# Patient Record
Sex: Female | Born: 1945
Health system: Southern US, Community
[De-identification: ages and names within clinical notes are randomized; demographics above are authoritative.]

## PROBLEM LIST (undated history)

## (undated) DIAGNOSIS — F32A Depression, unspecified: Secondary | ICD-10-CM

## (undated) DIAGNOSIS — E039 Hypothyroidism, unspecified: Secondary | ICD-10-CM

## (undated) DIAGNOSIS — E785 Hyperlipidemia, unspecified: Secondary | ICD-10-CM

## (undated) DIAGNOSIS — I4891 Unspecified atrial fibrillation: Secondary | ICD-10-CM

## (undated) DIAGNOSIS — F419 Anxiety disorder, unspecified: Secondary | ICD-10-CM

## (undated) DIAGNOSIS — I10 Essential (primary) hypertension: Secondary | ICD-10-CM

## (undated) DIAGNOSIS — J189 Pneumonia, unspecified organism: Secondary | ICD-10-CM

## (undated) DIAGNOSIS — F329 Major depressive disorder, single episode, unspecified: Secondary | ICD-10-CM

## (undated) DIAGNOSIS — G459 Transient cerebral ischemic attack, unspecified: Secondary | ICD-10-CM

## (undated) DIAGNOSIS — Z8673 Personal history of transient ischemic attack (TIA), and cerebral infarction without residual deficits: Secondary | ICD-10-CM

## (undated) DIAGNOSIS — I5032 Chronic diastolic (congestive) heart failure: Secondary | ICD-10-CM

## (undated) DIAGNOSIS — I499 Cardiac arrhythmia, unspecified: Secondary | ICD-10-CM

## (undated) HISTORY — PX: OTHER SURGICAL HISTORY: SHX169

## (undated) HISTORY — PX: BREAST SURGERY: SHX581

## (undated) HISTORY — DX: Chronic diastolic (congestive) heart failure: I50.32

## (undated) HISTORY — DX: Essential (primary) hypertension: I10

## (undated) HISTORY — PX: CHOLECYSTECTOMY: SHX55

## (undated) HISTORY — DX: Personal history of transient ischemic attack (TIA), and cerebral infarction without residual deficits: Z86.73

## (undated) HISTORY — PX: ABDOMINAL HYSTERECTOMY: SHX81

## (undated) HISTORY — DX: Hyperlipidemia, unspecified: E78.5

## (undated) HISTORY — DX: Hypothyroidism, unspecified: E03.9

---
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
## 2013-03-31 ENCOUNTER — Emergency Department (HOSPITAL_COMMUNITY): Payer: Medicare PPO

## 2013-03-31 ENCOUNTER — Inpatient Hospital Stay (HOSPITAL_COMMUNITY)
Admission: EM | Admit: 2013-03-31 | Discharge: 2013-04-05 | DRG: 189 | Disposition: A | Discharge status: Home / Self Care | Payer: Medicare PPO | Attending: Internal Medicine | Admitting: Internal Medicine

## 2013-03-31 ENCOUNTER — Encounter (HOSPITAL_COMMUNITY): Payer: Self-pay | Admitting: Emergency Medicine

## 2013-03-31 DIAGNOSIS — J9600 Acute respiratory failure, unspecified whether with hypoxia or hypercapnia: Principal | ICD-10-CM

## 2013-03-31 DIAGNOSIS — E039 Hypothyroidism, unspecified: Secondary | ICD-10-CM | POA: Diagnosis present

## 2013-03-31 DIAGNOSIS — I480 Paroxysmal atrial fibrillation: Secondary | ICD-10-CM | POA: Diagnosis present

## 2013-03-31 DIAGNOSIS — R590 Localized enlarged lymph nodes: Secondary | ICD-10-CM | POA: Diagnosis present

## 2013-03-31 DIAGNOSIS — M40209 Unspecified kyphosis, site unspecified: Secondary | ICD-10-CM | POA: Diagnosis present

## 2013-03-31 DIAGNOSIS — Z7982 Long term (current) use of aspirin: Secondary | ICD-10-CM

## 2013-03-31 DIAGNOSIS — I4891 Unspecified atrial fibrillation: Secondary | ICD-10-CM

## 2013-03-31 DIAGNOSIS — R0602 Shortness of breath: Secondary | ICD-10-CM

## 2013-03-31 DIAGNOSIS — I509 Heart failure, unspecified: Secondary | ICD-10-CM | POA: Diagnosis present

## 2013-03-31 DIAGNOSIS — J9621 Acute and chronic respiratory failure with hypoxia: Secondary | ICD-10-CM | POA: Diagnosis present

## 2013-03-31 DIAGNOSIS — I5030 Unspecified diastolic (congestive) heart failure: Secondary | ICD-10-CM | POA: Diagnosis present

## 2013-03-31 DIAGNOSIS — E0590 Thyrotoxicosis, unspecified without thyrotoxic crisis or storm: Secondary | ICD-10-CM | POA: Diagnosis present

## 2013-03-31 DIAGNOSIS — I2789 Other specified pulmonary heart diseases: Secondary | ICD-10-CM | POA: Diagnosis present

## 2013-03-31 DIAGNOSIS — I4819 Other persistent atrial fibrillation: Secondary | ICD-10-CM | POA: Diagnosis present

## 2013-03-31 DIAGNOSIS — J8410 Pulmonary fibrosis, unspecified: Secondary | ICD-10-CM | POA: Diagnosis present

## 2013-03-31 DIAGNOSIS — Z8701 Personal history of pneumonia (recurrent): Secondary | ICD-10-CM

## 2013-03-31 DIAGNOSIS — J90 Pleural effusion, not elsewhere classified: Secondary | ICD-10-CM

## 2013-03-31 DIAGNOSIS — R599 Enlarged lymph nodes, unspecified: Secondary | ICD-10-CM

## 2013-03-31 DIAGNOSIS — J189 Pneumonia, unspecified organism: Secondary | ICD-10-CM

## 2013-03-31 DIAGNOSIS — I10 Essential (primary) hypertension: Secondary | ICD-10-CM | POA: Diagnosis present

## 2013-03-31 DIAGNOSIS — J9601 Acute respiratory failure with hypoxia: Secondary | ICD-10-CM | POA: Diagnosis present

## 2013-03-31 DIAGNOSIS — E079 Disorder of thyroid, unspecified: Secondary | ICD-10-CM

## 2013-03-31 DIAGNOSIS — Z8673 Personal history of transient ischemic attack (TIA), and cerebral infarction without residual deficits: Secondary | ICD-10-CM

## 2013-03-31 DIAGNOSIS — M4000 Postural kyphosis, site unspecified: Secondary | ICD-10-CM | POA: Diagnosis present

## 2013-03-31 DIAGNOSIS — R0902 Hypoxemia: Secondary | ICD-10-CM | POA: Diagnosis present

## 2013-03-31 HISTORY — DX: Unspecified atrial fibrillation: I48.91

## 2013-03-31 HISTORY — DX: Pneumonia, unspecified organism: J18.9

## 2013-03-31 HISTORY — DX: Transient cerebral ischemic attack, unspecified: G45.9

## 2013-03-31 LAB — CBC WITH DIFFERENTIAL/PLATELET
Basophils Absolute: 0 10*3/uL (ref 0.0–0.1)
Basophils Relative: 1 % (ref 0–1)
EOS ABS: 0.1 10*3/uL (ref 0.0–0.7)
Eosinophils Relative: 3 % (ref 0–5)
HCT: 36.3 % (ref 36.0–46.0)
HEMOGLOBIN: 11.5 g/dL — AB (ref 12.0–15.0)
LYMPHS ABS: 1.1 10*3/uL (ref 0.7–4.0)
Lymphocytes Relative: 23 % (ref 12–46)
MCH: 28.9 pg (ref 26.0–34.0)
MCHC: 31.7 g/dL (ref 30.0–36.0)
MCV: 91.2 fL (ref 78.0–100.0)
MONOS PCT: 6 % (ref 3–12)
Monocytes Absolute: 0.3 10*3/uL (ref 0.1–1.0)
Neutro Abs: 3.3 10*3/uL (ref 1.7–7.7)
Neutrophils Relative %: 68 % (ref 43–77)
Platelets: 229 10*3/uL (ref 150–400)
RBC: 3.98 MIL/uL (ref 3.87–5.11)
RDW: 17.8 % — ABNORMAL HIGH (ref 11.5–15.5)
WBC: 4.8 10*3/uL (ref 4.0–10.5)

## 2013-03-31 LAB — COMPREHENSIVE METABOLIC PANEL
ALK PHOS: 111 U/L (ref 39–117)
ALT: 22 U/L (ref 0–35)
AST: 29 U/L (ref 0–37)
Albumin: 3.7 g/dL (ref 3.5–5.2)
BUN: 15 mg/dL (ref 6–23)
CALCIUM: 9.9 mg/dL (ref 8.4–10.5)
CO2: 27 mEq/L (ref 19–32)
Chloride: 105 mEq/L (ref 96–112)
Creatinine, Ser: 0.94 mg/dL (ref 0.50–1.10)
GFR calc non Af Amer: 61 mL/min — ABNORMAL LOW (ref >90)
GFR, EST AFRICAN AMERICAN: 71 mL/min — AB (ref >90)
GLUCOSE: 89 mg/dL (ref 70–99)
Potassium: 4.3 mEq/L (ref 3.7–5.3)
Sodium: 141 mEq/L (ref 137–147)
Total Bilirubin: 0.7 mg/dL (ref 0.3–1.2)
Total Protein: 8.1 g/dL (ref 6.0–8.3)

## 2013-03-31 LAB — TROPONIN I: Troponin I: <0.3 ng/mL (ref <0.30)

## 2013-03-31 LAB — PRO B NATRIURETIC PEPTIDE: PRO B NATRI PEPTIDE: 1241 pg/mL — AB (ref 0–125)

## 2013-03-31 MED ORDER — DEXTROSE 5 % IV SOLN
1.0000 g | Freq: Three times a day (TID) | INTRAVENOUS | Status: DC
  Administered 2013-04-01 – 2013-04-05 (×13): 1 g via INTRAVENOUS
  Filled 2013-03-31 (×17): qty 1

## 2013-03-31 MED ORDER — LEVOTHYROXINE SODIUM 100 MCG PO TABS
100.0000 ug | ORAL_TABLET | Freq: Every day | ORAL | Status: DC
  Administered 2013-04-01 – 2013-04-05 (×6): 100 ug via ORAL
  Filled 2013-03-31 (×5): qty 1

## 2013-03-31 MED ORDER — ASPIRIN EC 81 MG PO TBEC
81.0000 mg | DELAYED_RELEASE_TABLET | Freq: Every day | ORAL | Status: DC
  Administered 2013-04-01 – 2013-04-05 (×5): 81 mg via ORAL
  Filled 2013-03-31 (×5): qty 1

## 2013-03-31 MED ORDER — ESTRADIOL 1 MG PO TABS
ORAL_TABLET | ORAL | Status: AC
  Filled 2013-03-31: qty 2

## 2013-03-31 MED ORDER — ALBUTEROL SULFATE (2.5 MG/3ML) 0.083% IN NEBU: INHALATION_SOLUTION | Freq: Four times a day (QID) | RESPIRATORY_TRACT | Status: DC | PRN

## 2013-03-31 MED ORDER — DEXTROSE 5 % IV SOLN
INTRAVENOUS | Status: AC
  Filled 2013-03-31: qty 1

## 2013-03-31 MED ORDER — IOHEXOL 350 MG/ML SOLN
100.0000 mL | Freq: Once | INTRAVENOUS | Status: AC | PRN
  Administered 2013-03-31: 100 mL via INTRAVENOUS

## 2013-03-31 MED ORDER — SODIUM CHLORIDE 0.9 % IV SOLN: 250.0000 mL | INTRAVENOUS | Status: DC | PRN

## 2013-03-31 MED ORDER — ESCITALOPRAM OXALATE 10 MG PO TABS
20.0000 mg | ORAL_TABLET | Freq: Every day | ORAL | Status: DC
  Administered 2013-03-31 – 2013-04-04 (×5): 20 mg via ORAL
  Filled 2013-03-31 (×5): qty 2

## 2013-03-31 MED ORDER — METOPROLOL SUCCINATE ER 50 MG PO TB24
100.0000 mg | ORAL_TABLET | Freq: Every day | ORAL | Status: DC
  Administered 2013-04-01 – 2013-04-02 (×2): 100 mg via ORAL
  Filled 2013-03-31 (×3): qty 2

## 2013-03-31 MED ORDER — CEFEPIME HCL 1 G IJ SOLR
1.0000 g | Freq: Three times a day (TID) | INTRAMUSCULAR | Status: DC
  Filled 2013-03-31 (×9): qty 1

## 2013-03-31 MED ORDER — SODIUM CHLORIDE 0.9 % IV SOLN: INTRAVENOUS | Status: DC

## 2013-03-31 MED ORDER — MELOXICAM 7.5 MG PO TABS
ORAL_TABLET | ORAL | Status: AC
  Filled 2013-03-31: qty 2

## 2013-03-31 MED ORDER — LISINOPRIL 10 MG PO TABS
20.0000 mg | ORAL_TABLET | Freq: Two times a day (BID) | ORAL | Status: DC
  Administered 2013-03-31 – 2013-04-02 (×5): 20 mg via ORAL
  Filled 2013-03-31 (×6): qty 2

## 2013-03-31 MED ORDER — PANTOPRAZOLE SODIUM 40 MG PO TBEC
40.0000 mg | DELAYED_RELEASE_TABLET | Freq: Every day | ORAL | Status: DC
  Administered 2013-04-01 – 2013-04-05 (×5): 40 mg via ORAL
  Filled 2013-03-31 (×5): qty 1

## 2013-03-31 MED ORDER — SODIUM CHLORIDE 0.9 % IJ SOLN
3.0000 mL | Freq: Two times a day (BID) | INTRAMUSCULAR | Status: DC
  Administered 2013-03-31 – 2013-04-05 (×9): 3 mL via INTRAVENOUS

## 2013-03-31 MED ORDER — VANCOMYCIN HCL IN DEXTROSE 1-5 GM/200ML-% IV SOLN
1000.0000 mg | Freq: Once | INTRAVENOUS | Status: AC
  Administered 2013-03-31: 1000 mg via INTRAVENOUS
  Filled 2013-03-31: qty 200

## 2013-03-31 MED ORDER — AMLODIPINE BESYLATE 5 MG PO TABS
5.0000 mg | ORAL_TABLET | Freq: Every day | ORAL | Status: DC
  Administered 2013-04-01 – 2013-04-05 (×5): 5 mg via ORAL
  Filled 2013-03-31 (×5): qty 1

## 2013-03-31 MED ORDER — SODIUM CHLORIDE 0.9 % IV BOLUS (SEPSIS)
250.0000 mL | Freq: Once | INTRAVENOUS | Status: AC
  Administered 2013-03-31: 250 mL via INTRAVENOUS

## 2013-03-31 MED ORDER — ONDANSETRON HCL 4 MG/2ML IJ SOLN
4.0000 mg | Freq: Once | INTRAMUSCULAR | Status: AC
  Administered 2013-03-31: 4 mg via INTRAVENOUS
  Filled 2013-03-31: qty 2

## 2013-03-31 MED ORDER — ESTRADIOL 1 MG PO TABS
2.0000 mg | ORAL_TABLET | Freq: Every day | ORAL | Status: DC
  Administered 2013-03-31 – 2013-04-04 (×5): 2 mg via ORAL
  Filled 2013-03-31: qty 1
  Filled 2013-03-31 (×3): qty 2
  Filled 2013-03-31 (×2): qty 1
  Filled 2013-03-31: qty 2

## 2013-03-31 MED ORDER — FUROSEMIDE 40 MG PO TABS
40.0000 mg | ORAL_TABLET | Freq: Every day | ORAL | Status: DC
  Administered 2013-04-01 – 2013-04-02 (×2): 40 mg via ORAL
  Filled 2013-03-31 (×2): qty 1

## 2013-03-31 MED ORDER — ENOXAPARIN SODIUM 40 MG/0.4ML ~~LOC~~ SOLN
40.0000 mg | SUBCUTANEOUS | Status: DC
  Administered 2013-04-01 – 2013-04-05 (×5): 40 mg via SUBCUTANEOUS
  Filled 2013-03-31 (×5): qty 0.4

## 2013-03-31 MED ORDER — SODIUM CHLORIDE 0.9 % IJ SOLN: 3.0000 mL | INTRAMUSCULAR | Status: DC | PRN

## 2013-03-31 MED ORDER — AMIODARONE HCL 200 MG PO TABS
200.0000 mg | ORAL_TABLET | Freq: Every day | ORAL | Status: DC
  Administered 2013-04-01 – 2013-04-05 (×5): 200 mg via ORAL
  Filled 2013-03-31 (×5): qty 1

## 2013-03-31 MED ORDER — VANCOMYCIN HCL IN DEXTROSE 750-5 MG/150ML-% IV SOLN
750.0000 mg | Freq: Two times a day (BID) | INTRAVENOUS | Status: DC
  Administered 2013-04-01 – 2013-04-05 (×9): 750 mg via INTRAVENOUS
  Filled 2013-03-31 (×11): qty 150

## 2013-03-31 MED ORDER — MELOXICAM 7.5 MG PO TABS
15.0000 mg | ORAL_TABLET | Freq: Every day | ORAL | Status: DC
Start: 2013-03-31 — End: 2013-04-05
  Administered 2013-03-31 – 2013-04-04 (×5): 15 mg via ORAL
  Filled 2013-03-31 (×2): qty 2
  Filled 2013-03-31: qty 1
  Filled 2013-03-31 (×2): qty 2
  Filled 2013-03-31 (×2): qty 1

## 2013-03-31 MED ORDER — DEXTROSE 5 % IV SOLN
1.0000 g | Freq: Once | INTRAVENOUS | Status: AC
  Administered 2013-03-31: 1 g via INTRAVENOUS

## 2013-03-31 NOTE — Progress Notes (Signed)
ANTIBIOTIC CONSULT NOTE - INITIAL  Pharmacy Consult for Vancomycin Indication: pneumonia  Allergies  Allergen Reactions  . Levaquin [Levofloxacin] Other (See Comments)    Headache   . Sulfa Antibiotics Hives    Patient Measurements: Height: 5\' 2"  (157.5 cm) Weight: 181 lb (82.101 kg) IBW/kg (Calculated) : 50.1 Adjusted Body Weight:   Vital Signs: Temp: 97.8 F (36.6 C) (01/23 1852) Temp src: Oral (01/23 1852) BP: 167/79 mmHg (01/23 1852) Pulse Rate: 67 (01/23 1852) Intake/Output from previous day:   Intake/Output from this shift:    Labs:  Recent Labs  03/31/13 1550  WBC 4.8  HGB 11.5*  PLT 229  CREATININE 0.94   Estimated Creatinine Clearance: 57.7 ml/min (by C-G formula based on Cr of 0.94). No results found for this basename: VANCOTROUGH, VANCOPEAK, VANCORANDOM, GENTTROUGH, GENTPEAK, GENTRANDOM, TOBRATROUGH, TOBRAPEAK, TOBRARND, AMIKACINPEAK, AMIKACINTROU, AMIKACIN,  in the last 72 hours   Microbiology: No results found for this or any previous visit (from the past 720 hour(s)).  Medical History: Past Medical History  Diagnosis Date  . Atrial fibrillation   . Hypertension   . Thyroid disease   . Pneumonia     Medications:  Scheduled:  . [START ON 04/01/2013] vancomycin  750 mg Intravenous Q12H   Assessment: Healthcare acquired pneumonia Patient previously hospitalized over Thanksgiving for pneumonia in [place] Cefepime 1 GM IV x 1 dose in ED   Goal of Therapy:  Vancomycin trough level 15-20  Plan:  Vancomycin 1 GM IV loading dose, then 750 mg IV every 12 hours Vancomycin trough at steady state Monitor renal function Labs per protocol  Abner Greenspan, Emmory Solivan Bennett 03/31/2013,7:10 PM

## 2013-03-31 NOTE — H&P (Signed)
Chief Complaint:  sob  HPI: 68 yo female h/o afib, htn traveled here from [place] to visit her dtr 3 days ago.  Has been having worsening sob esp with exertion since.  No significant leg swelling or edema.  No fevers.  Having a dry nonproductive cough, no hemoptysis.  No cp.  Was tx for pna with doxycyline? She thinks in November for about a week then ended up getting hospitalized in [place] the week before thanksgiving for 6 days with pna.  When she was dc from hosp she was feeling better, and was not d/c on any more abx.  Since that time she has been recovering well, no sob.  No fevers since.  She has seen a pulmonologist several times since that hospitalization who has been doing serial chest xrays, she says that she was told by her pulmonologist that "there was still something there, but it was looking better.".  She does not recall having any formal PFT done.    Review of Systems:  Positive and negative as per HPI otherwise all other systems are negative  Past Medical History: Past Medical History  Diagnosis Date  . Atrial fibrillation   . Hypertension   . Thyroid disease   . Pneumonia   . TIA (transient ischemic attack)    Past Surgical History  Procedure Laterality Date  . Abdominal hysterectomy    . Breast surgery    . Cholecystectomy      Medications: Prior to Admission medications   Medication Sig Start Date End Date Taking? Authorizing Provider  acetaminophen (TYLENOL) 500 MG tablet Take 1,000 mg by mouth every 6 (six) hours as needed.   Yes Historical Provider, MD  albuterol (PROVENTIL HFA;VENTOLIN HFA) 108 (90 BASE) MCG/ACT inhaler Inhale 1-2 puffs into the lungs every 6 (six) hours as needed for wheezing or shortness of breath.   Yes Historical Provider, MD  amiodarone (PACERONE) 200 MG tablet Take 200 mg by mouth daily.   Yes Historical Provider, MD  amLODipine (NORVASC) 5 MG tablet Take 5 mg by mouth daily.   Yes Historical Provider, MD  aspirin EC 81 MG tablet Take  81 mg by mouth daily.   Yes Historical Provider, MD  escitalopram (LEXAPRO) 20 MG tablet Take 20 mg by mouth at bedtime.   Yes Historical Provider, MD  estradiol (ESTRACE) 2 MG tablet Take 2 mg by mouth at bedtime.   Yes Historical Provider, MD  furosemide (LASIX) 40 MG tablet Take 40 mg by mouth daily.   Yes Historical Provider, MD  levothyroxine (SYNTHROID, LEVOTHROID) 100 MCG tablet Take 100 mcg by mouth daily before breakfast.   Yes Historical Provider, MD  lisinopril (PRINIVIL,ZESTRIL) 20 MG tablet Take 20 mg by mouth 2 (two) times daily.   Yes Historical Provider, MD  magnesium oxide (MAG-OX) 400 MG tablet Take 400 mg by mouth daily.   Yes Historical Provider, MD  meloxicam (MOBIC) 15 MG tablet Take 15 mg by mouth at bedtime.   Yes Historical Provider, MD  metoprolol succinate (TOPROL-XL) 100 MG 24 hr tablet Take 100 mg by mouth daily. Take with or immediately following a meal.   Yes Historical Provider, MD  omeprazole (PRILOSEC) 40 MG capsule Take 40 mg by mouth daily.   Yes Historical Provider, MD  rizatriptan (MAXALT-MLT) 10 MG disintegrating tablet Take 10 mg by mouth as needed for migraine. May repeat in 2 hours if needed   Yes Historical Provider, MD    Allergies:   Allergies  Allergen Reactions  .  Levaquin [Levofloxacin] Other (See Comments)    Headache   . Sulfa Antibiotics Hives    Social History:  reports that she has never smoked. She does not have any smokeless tobacco history on file. She reports that she does not drink alcohol or use illicit drugs.  Family History: History reviewed. No pertinent family history.  Physical Exam: Filed Vitals:   03/31/13 1900 03/31/13 1914 03/31/13 2000 03/31/13 2105  BP: 160/67  159/68 154/69  Pulse:  68 65 62  Temp:  97.7 F (36.5 C)  97.6 F (36.4 C)  TempSrc:  Oral  Oral  Resp: 20  16 22   Height:    5\' 2"  (1.575 m)  Weight:      SpO2:   97% 96%   General appearance: alert, cooperative and no distress Head:  Normocephalic, without obvious abnormality, atraumatic Eyes: negative Nose: Nares normal. Septum midline. Mucosa normal. No drainage or sinus tenderness. Neck: no JVD and supple, symmetrical, trachea midline Lungs: clear to auscultation bilaterally significant kyphosis noted Heart: regular rate and rhythm, S1, S2 normal, no murmur, click, rub or gallop Abdomen: soft, non-tender; bowel sounds normal; no masses,  no organomegaly Extremities: extremities normal, atraumatic, no cyanosis or edema Pulses: 2+ and symmetric Skin: Skin color, texture, turgor normal. No rashes or lesions Neurologic: Grossly normal   Labs on Admission:   Recent Labs  03/31/13 1550  NA 141  K 4.3  CL 105  CO2 27  GLUCOSE 89  BUN 15  CREATININE 0.94  CALCIUM 9.9    Recent Labs  03/31/13 1550  AST 29  ALT 22  ALKPHOS 111  BILITOT 0.7  PROT 8.1  ALBUMIN 3.7    Recent Labs  03/31/13 1550  WBC 4.8  NEUTROABS 3.3  HGB 11.5*  HCT 36.3  MCV 91.2  PLT 229    Recent Labs  03/31/13 1550  TROPONINI <0.30   Radiological Exams on Admission: Dg Chest 2 View  03/31/2013   CLINICAL DATA:  Increased shortness of breath.  Recent pneumonia.  EXAM: CHEST  2 VIEW  COMPARISON:  None.  FINDINGS: The heart is mildly enlarged. Bilateral lower lobe interstitial and airspace disease is present, left greater than right. Effusions are evident. Atherosclerotic calcifications are present at the aortic arch. Thoracic kyphosis is exaggerated.  IMPRESSION: 1. Bilateral interstitial and airspace disease, left greater than right. This is highly concerning for pneumonia. 2. Bilateral pleural effusions are also worse on the left. 3. Exaggerated thoracic kyphosis. 4. Mild cardiac enlargement.   Electronically Signed   By: Lawrence Santiago M.D.   On: 03/31/2013 15:25   Ct Angio Chest Pe W/cm &/or Wo Cm  03/31/2013   CLINICAL DATA:  Short of breath  EXAM: CT ANGIOGRAPHY CHEST WITH CONTRAST  TECHNIQUE: Multidetector CT imaging of  the chest was performed using the standard protocol during bolus administration of intravenous contrast. Multiplanar CT image reconstructions including MIPs were obtained to evaluate the vascular anatomy.  CONTRAST:  14mL OMNIPAQUE IOHEXOL 350 MG/ML SOLN  COMPARISON:  None.  FINDINGS: No filling defects in the pulmonary arterial tree to suggest acute pulmonary thromboembolism.  Mild atherosclerotic changes of the aortic arch. No evidence of dissection or aneurysm.  Abnormal mediastinal adenopathy is present. Precarinal nodal mass measures 2.0 x 3.3 cm. 11 mm AP window node. 11 mm right hilar node. 10 mm left hilar node. Several small nodes between the innominate artery and right innominate vein are noted.  Bilateral small pleural effusions are present. There is  anterior extension suggesting an element of loculation.  There is wall thickening and/or edema of the entire length of the esophagus.  No pneumothorax.  There are diffuse pulmonary parenchymal changes with a predilection for the lung bases characterize by interlobular septal thickening and heterogeneous opacities. No nodular lung disease. No cystic changes. Some areas of ground-glass are visualized.  Benign appearing liver cysts.  Kyphosis of the thoracic spine.  Review of the MIP images confirms the above findings.  IMPRESSION: No evidence of acute pulmonary thromboembolism.  Bilateral pleural effusions with evidence of loculation.  Bilateral interstitial lung disease as described. This may represent chronic disease or acute inflammatory process.  Abnormal mediastinal adenopathy. Differential diagnosis includes volume overload, inflammatory disease, or malignancy.   Electronically Signed   By: Maryclare Bean M.D.   On: 03/31/2013 18:27    Assessment/Plan  68 yo female with persistent/recurrent bilateral pleural effusions, mediastinal adenopathy, sob for 3 days with new hypoxemia from possible recurrent pna  Principal Problem:   Acute respiratory failure  with hypoxia--  Unclear etiology.  Will obtain old records from [place].  Will cover empirically for recurrent pna with iv vanc and cefepime.  ???restrictive lung disease.  Also on amiodorone???  pulm toxicity form amiodorone.  Will ck tsh studies and also 2 D echo of heart.  Have also obtained pulmonary consultation.  Oxygen supplementation as needed.  On RA 84%.    Active Problems:   Hypertension   Thyroid disease   Atrial fibrillation   SOB (shortness of breath)   Mediastinal adenopathy   Pleural effusion, bilateral   Hypoxia   Kyphosis deformity of spine    Brinlee Gambrell A 03/31/2013, 10:01 PM

## 2013-03-31 NOTE — ED Provider Notes (Signed)
CSN: [id number]     Arrival date & time 03/31/13  1420 History   First MD Initiated Contact with Patient 03/31/13 1624     Chief Complaint  Patient presents with  . Shortness of Breath   (Consider location/radiation/quality/duration/timing/severity/associated sxs/prior Treatment) Patient is a 68 y.o. female presenting with shortness of breath. The history is provided by the patient.  Shortness of Breath Associated symptoms: no abdominal pain, no chest pain, no fever, no headaches, no rash and no vomiting    patient with complaint of three-day history of exertional shortness of breath. Associated with chest pain substernal 15-30 minutes intermittently last episode was at 9 this morning none since. Patient has been struggling with pneumonia since admission November 21. Patient with close followup in [place] she is from out of town they have been showing slow resolution of the pneumonia. Patient was feeling better until 3 days ago. This does not feel like her pneumonia though. She had a fever with that. Patient recently made to drive from [place] to here. Patient has not been on any antibiotics since the end of November.  Past Medical History  Diagnosis Date  . Atrial fibrillation   . Hypertension   . Thyroid disease   . Pneumonia    Past Surgical History  Procedure Laterality Date  . Abdominal hysterectomy    . Breast surgery    . Cholecystectomy     History reviewed. No pertinent family history. History  Substance Use Topics  . Smoking status: Never Smoker   . Smokeless tobacco: Not on file  . Alcohol Use: No   OB History   Grav Para Term Preterm Abortions TAB SAB Ect Mult Living                 Review of Systems  Constitutional: Negative for fever.  HENT: Negative for congestion.   Eyes: Negative for redness.  Respiratory: Positive for shortness of breath.   Cardiovascular: Negative for chest pain.  Gastrointestinal: Negative for nausea, vomiting and abdominal pain.   Genitourinary: Negative for dysuria.  Musculoskeletal: Negative for back pain.  Skin: Negative for rash.  Neurological: Negative for headaches.  Hematological: Does not bruise/bleed easily.  Psychiatric/Behavioral: Negative for confusion.    Allergies  Levaquin and Sulfa antibiotics  Home Medications   Current Outpatient Rx  Name  Route  Sig  Dispense  Refill  . acetaminophen (TYLENOL) 500 MG tablet   Oral   Take 1,000 mg by mouth every 6 (six) hours as needed.         Marland Kitchen albuterol (PROVENTIL HFA;VENTOLIN HFA) 108 (90 BASE) MCG/ACT inhaler   Inhalation   Inhale 1-2 puffs into the lungs every 6 (six) hours as needed for wheezing or shortness of breath.         Marland Kitchen amiodarone (PACERONE) 200 MG tablet   Oral   Take 200 mg by mouth daily.         Marland Kitchen amLODipine (NORVASC) 5 MG tablet   Oral   Take 5 mg by mouth daily.         Marland Kitchen aspirin EC 81 MG tablet   Oral   Take 81 mg by mouth daily.         Marland Kitchen escitalopram (LEXAPRO) 20 MG tablet   Oral   Take 20 mg by mouth at bedtime.         Marland Kitchen estradiol (ESTRACE) 2 MG tablet   Oral   Take 2 mg by mouth at bedtime.         Marland Kitchen  furosemide (LASIX) 40 MG tablet   Oral   Take 40 mg by mouth daily.         Marland Kitchen levothyroxine (SYNTHROID, LEVOTHROID) 100 MCG tablet   Oral   Take 100 mcg by mouth daily before breakfast.         . lisinopril (PRINIVIL,ZESTRIL) 20 MG tablet   Oral   Take 20 mg by mouth 2 (two) times daily.         . magnesium oxide (MAG-OX) 400 MG tablet   Oral   Take 400 mg by mouth daily.         . meloxicam (MOBIC) 15 MG tablet   Oral   Take 15 mg by mouth at bedtime.         . metoprolol succinate (TOPROL-XL) 100 MG 24 hr tablet   Oral   Take 100 mg by mouth daily. Take with or immediately following a meal.         . omeprazole (PRILOSEC) 40 MG capsule   Oral   Take 40 mg by mouth daily.         . rizatriptan (MAXALT-MLT) 10 MG disintegrating tablet   Oral   Take 10 mg by mouth as  needed for migraine. May repeat in 2 hours if needed          BP 148/61  Pulse 68  Temp(Src) 98.1 F (36.7 C) (Oral)  Resp 24  Ht 5\' 2"  (1.575 m)  Wt 181 lb (82.101 kg)  BMI 33.10 kg/m2  SpO2 92% Physical Exam  Nursing note and vitals reviewed. Constitutional: She is oriented to person, place, and time. She appears well-developed and well-nourished. No distress.  HENT:  Head: Normocephalic and atraumatic.  Mouth/Throat: Oropharynx is clear and moist.  Eyes: Conjunctivae and EOM are normal. Pupils are equal, round, and reactive to light.  Neck: Normal range of motion.  Cardiovascular: Normal rate, regular rhythm and normal heart sounds.   No murmur heard. Pulmonary/Chest: Effort normal and breath sounds normal. No respiratory distress.  Abdominal: Soft. Bowel sounds are normal. There is no tenderness.  Musculoskeletal: Normal range of motion. She exhibits no edema.  Neurological: She is alert and oriented to person, place, and time. No cranial nerve deficit. She exhibits normal muscle tone. Coordination normal.  Skin: Skin is warm. No rash noted.    ED Course  Procedures (including critical care time) Labs Review Labs Reviewed  CBC WITH DIFFERENTIAL - Abnormal; Notable for the following:    Hemoglobin 11.5 (*)    RDW 17.8 (*)    All other components within normal limits  COMPREHENSIVE METABOLIC PANEL - Abnormal; Notable for the following:    GFR calc non Af Amer 61 (*)    GFR calc Af Amer 71 (*)    All other components within normal limits  PRO B NATRIURETIC PEPTIDE - Abnormal; Notable for the following:    Pro B Natriuretic peptide (BNP) 1241.0 (*)    All other components within normal limits  TROPONIN I   Results for orders placed during the hospital encounter of 03/31/13  CBC WITH DIFFERENTIAL      Result Value Range   WBC 4.8  4.0 - 10.5 K/uL   RBC 3.98  3.87 - 5.11 MIL/uL   Hemoglobin 11.5 (*) 12.0 - 15.0 g/dL   HCT 36.3  36.0 - 46.0 %   MCV 91.2  78.0 -  100.0 fL   MCH 28.9  26.0 - 34.0 pg   MCHC 31.7  30.0 - 36.0 g/dL   RDW 17.8 (*) 11.5 - 15.5 %   Platelets 229  150 - 400 K/uL   Neutrophils Relative % 68  43 - 77 %   Neutro Abs 3.3  1.7 - 7.7 K/uL   Lymphocytes Relative 23  12 - 46 %   Lymphs Abs 1.1  0.7 - 4.0 K/uL   Monocytes Relative 6  3 - 12 %   Monocytes Absolute 0.3  0.1 - 1.0 K/uL   Eosinophils Relative 3  0 - 5 %   Eosinophils Absolute 0.1  0.0 - 0.7 K/uL   Basophils Relative 1  0 - 1 %   Basophils Absolute 0.0  0.0 - 0.1 K/uL  COMPREHENSIVE METABOLIC PANEL      Result Value Range   Sodium 141  137 - 147 mEq/L   Potassium 4.3  3.7 - 5.3 mEq/L   Chloride 105  96 - 112 mEq/L   CO2 27  19 - 32 mEq/L   Glucose, Bld 89  70 - 99 mg/dL   BUN 15  6 - 23 mg/dL   Creatinine, Ser 0.94  0.50 - 1.10 mg/dL   Calcium 9.9  8.4 - 10.5 mg/dL   Total Protein 8.1  6.0 - 8.3 g/dL   Albumin 3.7  3.5 - 5.2 g/dL   AST 29  0 - 37 U/L   ALT 22  0 - 35 U/L   Alkaline Phosphatase 111  39 - 117 U/L   Total Bilirubin 0.7  0.3 - 1.2 mg/dL   GFR calc non Af Amer 61 (*) >90 mL/min   GFR calc Af Amer 71 (*) >90 mL/min  PRO B NATRIURETIC PEPTIDE      Result Value Range   Pro B Natriuretic peptide (BNP) 1241.0 (*) 0 - 125 pg/mL  TROPONIN I      Result Value Range   Troponin I <0.30  <0.30 ng/mL    Imaging Review Dg Chest 2 View  03/31/2013   CLINICAL DATA:  Increased shortness of breath.  Recent pneumonia.  EXAM: CHEST  2 VIEW  COMPARISON:  None.  FINDINGS: The heart is mildly enlarged. Bilateral lower lobe interstitial and airspace disease is present, left greater than right. Effusions are evident. Atherosclerotic calcifications are present at the aortic arch. Thoracic kyphosis is exaggerated.  IMPRESSION: 1. Bilateral interstitial and airspace disease, left greater than right. This is highly concerning for pneumonia. 2. Bilateral pleural effusions are also worse on the left. 3. Exaggerated thoracic kyphosis. 4. Mild cardiac enlargement.    Electronically Signed   By: Lawrence Santiago M.D.   On: 03/31/2013 15:25   Ct Angio Chest Pe W/cm &/or Wo Cm  03/31/2013   CLINICAL DATA:  Short of breath  EXAM: CT ANGIOGRAPHY CHEST WITH CONTRAST  TECHNIQUE: Multidetector CT imaging of the chest was performed using the standard protocol during bolus administration of intravenous contrast. Multiplanar CT image reconstructions including MIPs were obtained to evaluate the vascular anatomy.  CONTRAST:  122mL OMNIPAQUE IOHEXOL 350 MG/ML SOLN  COMPARISON:  None.  FINDINGS: No filling defects in the pulmonary arterial tree to suggest acute pulmonary thromboembolism.  Mild atherosclerotic changes of the aortic arch. No evidence of dissection or aneurysm.  Abnormal mediastinal adenopathy is present. Precarinal nodal mass measures 2.0 x 3.3 cm. 11 mm AP window node. 11 mm right hilar node. 10 mm left hilar node. Several small nodes between the innominate artery and right innominate vein are noted.  Bilateral small  pleural effusions are present. There is anterior extension suggesting an element of loculation.  There is wall thickening and/or edema of the entire length of the esophagus.  No pneumothorax.  There are diffuse pulmonary parenchymal changes with a predilection for the lung bases characterize by interlobular septal thickening and heterogeneous opacities. No nodular lung disease. No cystic changes. Some areas of ground-glass are visualized.  Benign appearing liver cysts.  Kyphosis of the thoracic spine.  Review of the MIP images confirms the above findings.  IMPRESSION: No evidence of acute pulmonary thromboembolism.  Bilateral pleural effusions with evidence of loculation.  Bilateral interstitial lung disease as described. This may represent chronic disease or acute inflammatory process.  Abnormal mediastinal adenopathy. Differential diagnosis includes volume overload, inflammatory disease, or malignancy.   Electronically Signed   By: Maryclare Bean M.D.   On:  03/31/2013 18:27    EKG Interpretation    Date/Time:  Friday March 31 2013 17:39:51 EST Ventricular Rate:  66 PR Interval:  176 QRS Duration: 82 QT Interval:  444 QTC Calculation: 465 R Axis:   42 Text Interpretation:  Normal sinus rhythm Normal ECG No previous ECGs available Confirmed by Dawnn Nam  MD, Giavana Rooke ([id number]) on 03/31/2013 6:18:36 PM            MDM   1. SOB (shortness of breath)   2. HCAP (healthcare-associated pneumonia)   3. Hypoxia   4. Mediastinal adenopathy    Patient is from out of town. Patient with complaint of shortness of breath with exertion for 3 days. Also associated with substernal chest pain lasting 15-30 minutes intermittently last episode was 9 this morning none since. Patient with an extensive history of problems with the pneumonia was actually hospitalized over Thanksgiving in [place] has been followed carefully since with them reporting to slow improvement in the pneumonia patient last on antibiotics over Thanksgiving. Patient had a long drive from [place] to here. Patient denies fevers states it does not feel like it did when she had the pneumonia before during that time she had a fever.  Patient clinically was looking well which came back from CT scan however she dropped her oxygen saturations down to 84% needed he placed on 4 L of oxygen attempted to place her on 2 sats were still slightly below 90%. Patient will require admission. Will start healthcare acquired pneumonia antibiotics CT scan does show 6 some question of perhaps a pneumonia. No evidence of pulmonary embolism no significant evidence of congestive heart failure. There are bilateral small pleural effusions most likely not enough to cause problems. Patient does also have mediastinal adenopathy that could represent a neoplastic process. In addition for the chest pain patient's troponin is negative. EKG without any significant abnormalities. No old EKG for comparison.    Mervin Kung,  MD 03/31/13 [phone]

## 2013-03-31 NOTE — ED Notes (Addendum)
Sob , sent from Urgent Care. With ?CHF  And recent pneumonia,   Cough,nonproductive.  , no fever.   Increased sob when exertion.  Pt is visiting here.  Had pneumonia in Nov. 2014 Being seen by pulmonologist.

## 2013-03-31 NOTE — ED Notes (Signed)
MD at bedside. 

## 2013-03-31 NOTE — ED Notes (Signed)
Went in to check on pt. Pt having moderate dyspnea upon entering room. Pt o2 sats 84% on room air. Pt repositioned,placed on 2liters nasal cannula, o2 sat 90%. Increased oxygen to 4liters. Pt o2 sat >94%. Pt placed on cardiac monitor. Pt reports breathing improved. Pt remains afebrile but flushed. EDP aware and reported pt plan of care includes admission. Pt aware and verbalized understanding.

## 2013-04-01 DIAGNOSIS — I059 Rheumatic mitral valve disease, unspecified: Secondary | ICD-10-CM

## 2013-04-01 DIAGNOSIS — J189 Pneumonia, unspecified organism: Secondary | ICD-10-CM

## 2013-04-01 LAB — BASIC METABOLIC PANEL
BUN: 13 mg/dL (ref 6–23)
CHLORIDE: 106 meq/L (ref 96–112)
CO2: 26 mEq/L (ref 19–32)
Calcium: 8.9 mg/dL (ref 8.4–10.5)
Creatinine, Ser: 0.91 mg/dL (ref 0.50–1.10)
GFR calc non Af Amer: 64 mL/min — ABNORMAL LOW (ref >90)
GFR, EST AFRICAN AMERICAN: 74 mL/min — AB (ref >90)
Glucose, Bld: 88 mg/dL (ref 70–99)
Potassium: 4.1 mEq/L (ref 3.7–5.3)
SODIUM: 140 meq/L (ref 137–147)

## 2013-04-01 LAB — CBC WITH DIFFERENTIAL/PLATELET
BASOS ABS: 0 10*3/uL (ref 0.0–0.1)
Basophils Relative: 1 % (ref 0–1)
EOS PCT: 3 % (ref 0–5)
Eosinophils Absolute: 0.1 10*3/uL (ref 0.0–0.7)
HCT: 30.8 % — ABNORMAL LOW (ref 36.0–46.0)
Hemoglobin: 9.8 g/dL — ABNORMAL LOW (ref 12.0–15.0)
LYMPHS ABS: 0.9 10*3/uL (ref 0.7–4.0)
LYMPHS PCT: 22 % (ref 12–46)
MCH: 29.3 pg (ref 26.0–34.0)
MCHC: 31.8 g/dL (ref 30.0–36.0)
MCV: 91.9 fL (ref 78.0–100.0)
Monocytes Absolute: 0.3 10*3/uL (ref 0.1–1.0)
Monocytes Relative: 8 % (ref 3–12)
NEUTROS ABS: 2.7 10*3/uL (ref 1.7–7.7)
NEUTROS PCT: 66 % (ref 43–77)
PLATELETS: 195 10*3/uL (ref 150–400)
RBC: 3.35 MIL/uL — AB (ref 3.87–5.11)
RDW: 18.3 % — ABNORMAL HIGH (ref 11.5–15.5)
WBC: 4.1 10*3/uL (ref 4.0–10.5)

## 2013-04-01 LAB — STREP PNEUMONIAE URINARY ANTIGEN: STREP PNEUMO URINARY ANTIGEN: NEGATIVE

## 2013-04-01 NOTE — Progress Notes (Signed)
  Echocardiogram 2D Echocardiogram has been performed.  Diane Cohen 04/01/2013, 12:30 PM

## 2013-04-01 NOTE — Progress Notes (Signed)
  CARE MANAGEMENT ED NOTE 04/01/2013  Patient:  Diane Cohen, Diane Cohen   Account Number:  [id number]  Date Initiated:  04/01/2013  Documentation initiated by:  Joan Flores  Subjective/Objective Assessment:     Subjective/Objective Assessment Detail:   68 yo female h/o afib, htn has been having worsening sob with exertion.  Having a dry nonproductive cough, no hemoptysis.  No chest pain.  CT scan completed. Effusions are evident.  Patient admitted with med mangement.     Action/Plan:   Action/Plan Detail:   Anticipated DC Date:  04/04/2013     Status Recommendation to Physician:   Result of Recommendation:        Choice offered to / List presented to:            Status of service:  Completed, signed off  ED Comments:   ED Comments Detail:

## 2013-04-01 NOTE — Progress Notes (Signed)
[facility] PROGRESS NOTE  Diane Cohen [id number] DOB: 1946/01/07 DOA: 03/31/2013 PCP: Provider Not In System  Assessment/Plan: 1. Recurrent Pneumonia? Acute respiratory failure with hypoxia.  Is being treated for health care associated pneumonia.  On IV vancomyicn and IV cefepime.  Blood and sputum cultures are pending.  Urine for legionella and streptococcal pending.  - un explained bilateral effusions, echocardiogram pending to see if chf.  -  Need to evaluate for restrictive lung disease vs pulm toxicity from amiodarone.  - will await pulm recommendations.  - oxygen to keep sat's greater then 90% - tsh and echocardiogram pending.   Hypertension   Atrial fibrillation : rate controlled on amiodarone.   Hypothyroidism: on synthroid. tsh pending.    DVT prophylaxis.   Code Status: full code Family Communication: none at bedside, plan discussed with pt. Disposition Plan: pending.    Consultants:  Pulmonary consult.   Procedures:  CT angio  echocardiogram Antibiotics                Cefepime 1 /24 Vancomycin 1/24 HPI/Subjective: Pleasant mood. No new complaints. Feel sbetter than yesterday.   Objective: Filed Vitals:   04/01/13 1410  BP: 103/63  Pulse: 63  Temp: 98.2 F (36.8 C)  Resp:     Intake/Output Summary (Last 24 hours) at 04/01/13 1507 Last data filed at 04/01/13 1300  Gross per 24 hour  Intake    720 ml  Output    900 ml  Net   -180 ml   Filed Weights   03/31/13 1444  Weight: 82.101 kg (181 lb)    Exam:   General:  Alert afebrile comfortable  Cardiovascular: s1s2  Respiratory: decreased air entry bilateral  Abdomen: soft NT ND BS+  Musculoskeletal: TRACE PEDAL EDEMA  Data Reviewed: Basic Metabolic Panel:  Recent Labs Lab 03/31/13 1550 04/01/13 0637  NA 141 140  K 4.3 4.1  CL 105 106  CO2 27 26  GLUCOSE 89 88  BUN 15 13  CREATININE 0.94 0.91  CALCIUM 9.9 8.9   Liver Function Tests:  Recent Labs Lab  03/31/13 1550  AST 29  ALT 22  ALKPHOS 111  BILITOT 0.7  PROT 8.1  ALBUMIN 3.7   No results found for this basename: LIPASE, AMYLASE,  in the last 168 hours No results found for this basename: AMMONIA,  in the last 168 hours CBC:  Recent Labs Lab 03/31/13 1550 04/01/13 0637  WBC 4.8 4.1  NEUTROABS 3.3 2.7  HGB 11.5* 9.8*  HCT 36.3 30.8*  MCV 91.2 91.9  PLT 229 195   Cardiac Enzymes:  Recent Labs Lab 03/31/13 1550  TROPONINI <0.30   BNP (last 3 results)  Recent Labs  03/31/13 1550  PROBNP 1241.0*   CBG: No results found for this basename: GLUCAP,  in the last 168 hours  Recent Results (from the past 240 hour(s))  CULTURE, BLOOD (ROUTINE X 2)     Status: None   Collection Time    03/31/13  7:18 PM      Result Value Range Status   Specimen Description Blood BLOOD RIGHT HAND   Final   Special Requests BOTTLES DRAWN AEROBIC ONLY 6CC   Final   Culture NO GROWTH 1 DAY   Final   Report Status PENDING   Incomplete  CULTURE, BLOOD (ROUTINE X 2)     Status: None   Collection Time    03/31/13  7:24 PM      Result Value Range Status  Specimen Description Blood LEFT ANTECUBITAL   Final   Special Requests BOTTLES DRAWN AEROBIC AND ANAEROBIC 8CC   Final   Culture NO GROWTH 1 DAY   Final   Report Status PENDING   Incomplete     Studies: Dg Chest 2 View  03/31/2013   CLINICAL DATA:  Increased shortness of breath.  Recent pneumonia.  EXAM: CHEST  2 VIEW  COMPARISON:  None.  FINDINGS: The heart is mildly enlarged. Bilateral lower lobe interstitial and airspace disease is present, left greater than right. Effusions are evident. Atherosclerotic calcifications are present at the aortic arch. Thoracic kyphosis is exaggerated.  IMPRESSION: 1. Bilateral interstitial and airspace disease, left greater than right. This is highly concerning for pneumonia. 2. Bilateral pleural effusions are also worse on the left. 3. Exaggerated thoracic kyphosis. 4. Mild cardiac enlargement.    Electronically Signed   By: Lawrence Santiago M.D.   On: 03/31/2013 15:25   Ct Angio Chest Pe W/cm &/or Wo Cm  03/31/2013   CLINICAL DATA:  Short of breath  EXAM: CT ANGIOGRAPHY CHEST WITH CONTRAST  TECHNIQUE: Multidetector CT imaging of the chest was performed using the standard protocol during bolus administration of intravenous contrast. Multiplanar CT image reconstructions including MIPs were obtained to evaluate the vascular anatomy.  CONTRAST:  111mL OMNIPAQUE IOHEXOL 350 MG/ML SOLN  COMPARISON:  None.  FINDINGS: No filling defects in the pulmonary arterial tree to suggest acute pulmonary thromboembolism.  Mild atherosclerotic changes of the aortic arch. No evidence of dissection or aneurysm.  Abnormal mediastinal adenopathy is present. Precarinal nodal mass measures 2.0 x 3.3 cm. 11 mm AP window node. 11 mm right hilar node. 10 mm left hilar node. Several small nodes between the innominate artery and right innominate vein are noted.  Bilateral small pleural effusions are present. There is anterior extension suggesting an element of loculation.  There is wall thickening and/or edema of the entire length of the esophagus.  No pneumothorax.  There are diffuse pulmonary parenchymal changes with a predilection for the lung bases characterize by interlobular septal thickening and heterogeneous opacities. No nodular lung disease. No cystic changes. Some areas of ground-glass are visualized.  Benign appearing liver cysts.  Kyphosis of the thoracic spine.  Review of the MIP images confirms the above findings.  IMPRESSION: No evidence of acute pulmonary thromboembolism.  Bilateral pleural effusions with evidence of loculation.  Bilateral interstitial lung disease as described. This may represent chronic disease or acute inflammatory process.  Abnormal mediastinal adenopathy. Differential diagnosis includes volume overload, inflammatory disease, or malignancy.   Electronically Signed   By: Maryclare Bean M.D.   On:  03/31/2013 18:27    Scheduled Meds: . amiodarone  200 mg Oral Daily  . amLODipine  5 mg Oral Daily  . aspirin EC  81 mg Oral Daily  . ceFEPime (MAXIPIME) IV  1 g Intravenous Q8H  . enoxaparin (LOVENOX) injection  40 mg Subcutaneous Q24H  . escitalopram  20 mg Oral QHS  . estradiol  2 mg Oral QHS  . furosemide  40 mg Oral Daily  . levothyroxine  100 mcg Oral QAC breakfast  . lisinopril  20 mg Oral BID  . meloxicam  15 mg Oral QHS  . metoprolol succinate  100 mg Oral Daily  . pantoprazole  40 mg Oral Daily  . sodium chloride  3 mL Intravenous Q12H  . vancomycin  750 mg Intravenous Q12H   Continuous Infusions:   Principal Problem:   Acute respiratory failure  with hypoxia Active Problems:   Hypertension   Thyroid disease   Atrial fibrillation   SOB (shortness of breath)   Mediastinal adenopathy   Pleural effusion, bilateral   Hypoxia   Kyphosis deformity of spine    Time spent: 25 min    Schyler Butikofer  [facility] Pager [phone] 7PM-7AM, please contact night-coverage at [email or web address], password [facility] 04/01/2013, 3:07 PM  LOS: 1 day

## 2013-04-01 NOTE — Progress Notes (Signed)
ANTIBIOTIC CONSULT NOTE - Pharmacy Consult for Vancomycin & Renal adjustment antibiotics Indication: pneumonia  Allergies  Allergen Reactions  . Levaquin [Levofloxacin] Other (See Comments)    Headache   . Sulfa Antibiotics Hives    Patient Measurements: Height: 5\' 2"  (157.5 cm) Weight: 181 lb (82.101 kg) IBW/kg (Calculated) : 50.1 Adjusted Body Weight:   Vital Signs: Temp: 98 F (36.7 C) (01/24 0409) Temp src: Oral (01/24 0409) BP: 103/53 mmHg (01/24 0409) Pulse Rate: 60 (01/24 0409) Intake/Output from previous day: 01/23 0701 - 01/24 0700 In: 240 [P.O.:240] Out: 900 [Urine:900] Intake/Output from this shift:    Labs:  Recent Labs  03/31/13 1550 04/01/13 0637  WBC 4.8 4.1  HGB 11.5* 9.8*  PLT 229 195  CREATININE 0.94 0.91   Estimated Creatinine Clearance: 59.6 ml/min (by C-G formula based on Cr of 0.91). No results found for this basename: VANCOTROUGH, Corlis Leak, VANCORANDOM, [place], GENTPEAK, GENTRANDOM, TOBRATROUGH, TOBRAPEAK, TOBRARND, AMIKACINPEAK, AMIKACINTROU, AMIKACIN,  in the last 72 hours   Microbiology: Recent Results (from the past 720 hour(s))  CULTURE, BLOOD (ROUTINE X 2)     Status: None   Collection Time    03/31/13  7:18 PM      Result Value Range Status   Specimen Description Blood BLOOD RIGHT HAND   Final   Special Requests BOTTLES DRAWN AEROBIC ONLY 6CC   Final   Culture NO GROWTH 1 DAY   Final   Report Status PENDING   Incomplete  CULTURE, BLOOD (ROUTINE X 2)     Status: None   Collection Time    03/31/13  7:24 PM      Result Value Range Status   Specimen Description Blood LEFT ANTECUBITAL   Final   Special Requests BOTTLES DRAWN AEROBIC AND ANAEROBIC 8CC   Final   Culture NO GROWTH 1 DAY   Final   Report Status PENDING   Incomplete    Medical History: Past Medical History  Diagnosis Date  . Atrial fibrillation   . Hypertension   . Thyroid disease   . Pneumonia   . TIA (transient ischemic attack)     Medications:   Scheduled:  . amiodarone  200 mg Oral Daily  . amLODipine  5 mg Oral Daily  . aspirin EC  81 mg Oral Daily  . ceFEPime (MAXIPIME) IV  1 g Intravenous Q8H  . enoxaparin (LOVENOX) injection  40 mg Subcutaneous Q24H  . escitalopram  20 mg Oral QHS  . estradiol  2 mg Oral QHS  . furosemide  40 mg Oral Daily  . levothyroxine  100 mcg Oral QAC breakfast  . lisinopril  20 mg Oral BID  . meloxicam  15 mg Oral QHS  . metoprolol succinate  100 mg Oral Daily  . pantoprazole  40 mg Oral Daily  . sodium chloride  3 mL Intravenous Q12H  . vancomycin  750 mg Intravenous Q12H   Assessment: Healthcare acquired pneumonia Patient previously hospitalized over Thanksgiving for pneumonia in [place] Cefepime 1 GM IV x 1 dose in ED Cefepime 1 GM IV every 8 hours added   Goal of Therapy:  Vancomycin trough level 15-20  Plan:  Continue Cefepime 1 GM IV every 8 hours Continue Vancomycin 750 mg IV every 12 hours Vancomycin trough at steady state Monitor renal function Labs per protocol  Abner Greenspan, [place] Murthy Bennett 04/01/2013,8:54 AM

## 2013-04-02 LAB — BASIC METABOLIC PANEL
BUN: 19 mg/dL (ref 6–23)
CHLORIDE: 103 meq/L (ref 96–112)
CO2: 28 mEq/L (ref 19–32)
Calcium: 9.1 mg/dL (ref 8.4–10.5)
Creatinine, Ser: 1.01 mg/dL (ref 0.50–1.10)
GFR calc Af Amer: 65 mL/min — ABNORMAL LOW (ref >90)
GFR calc non Af Amer: 56 mL/min — ABNORMAL LOW (ref >90)
Glucose, Bld: 95 mg/dL (ref 70–99)
Potassium: 4.1 mEq/L (ref 3.7–5.3)
SODIUM: 139 meq/L (ref 137–147)

## 2013-04-02 LAB — TSH: TSH: 7.777 u[IU]/mL — ABNORMAL HIGH (ref 0.350–4.500)

## 2013-04-02 LAB — CBC
HCT: 32.9 % — ABNORMAL LOW (ref 36.0–46.0)
Hemoglobin: 10.4 g/dL — ABNORMAL LOW (ref 12.0–15.0)
MCH: 29 pg (ref 26.0–34.0)
MCHC: 31.6 g/dL (ref 30.0–36.0)
MCV: 91.6 fL (ref 78.0–100.0)
PLATELETS: 195 10*3/uL (ref 150–400)
RBC: 3.59 MIL/uL — AB (ref 3.87–5.11)
RDW: 17.9 % — ABNORMAL HIGH (ref 11.5–15.5)
WBC: 4.4 10*3/uL (ref 4.0–10.5)

## 2013-04-02 LAB — LEGIONELLA ANTIGEN, URINE: Legionella Antigen, Urine: NEGATIVE

## 2013-04-02 MED ORDER — FUROSEMIDE 10 MG/ML IJ SOLN
40.0000 mg | Freq: Every day | INTRAMUSCULAR | Status: DC
  Administered 2013-04-02 – 2013-04-04 (×3): 40 mg via INTRAVENOUS
  Filled 2013-04-02 (×3): qty 4

## 2013-04-02 NOTE — Consult Note (Signed)
Consult requested by: [facility] Consult requested for abnormal chest CT:  HPI: This is a 68 year old who lives in [place] and who had come here to visit family. She developed increasing shortness of breath over the last several days. When she came to the emergency department she had CT of the chest because of her travel history and although this did not show a pulmonary embolus it did show evidence of interstitial pulmonary disease and pleural effusions. Clinically she seems to have pneumonia. She was hospitalized in [place] in November with pneumonia and had CT of the chest at bedtime. Those records have been sent for. She says she's still somewhat short of breath. She's coughing up some sputum.  Past Medical History  Diagnosis Date  . Atrial fibrillation   . Hypertension   . Thyroid disease   . Pneumonia   . TIA (transient ischemic attack)      History reviewed. No pertinent family history.   History   Social History  . Marital Status: Married    Spouse Name: N/A    Number of Children: N/A  . Years of Education: N/A   Social History Main Topics  . Smoking status: Never Smoker   . Smokeless tobacco: None  . Alcohol Use: No  . Drug Use: No  . Sexual Activity: Yes    Birth Control/ Protection: Surgical   Other Topics Concern  . None   Social History Narrative  . None     ROS: She denies any hemoptysis chest pain has felt feverish has not had chills    Objective: Vital signs in last 24 hours: Temp:  [98.1 F (36.7 C)-98.3 F (36.8 C)] 98.1 F (36.7 C) (01/25 0500) Pulse Rate:  [63] 63 (01/25 0500) Resp:  [20] 20 (01/25 0500) BP: (103-130)/(49-69) 124/49 mmHg (01/25 0500) SpO2:  [95 %-96 %] 95 % (01/25 0500) Weight change:  Last BM Date: 03/30/13  Intake/Output from previous day: 01/24 0701 - 01/25 0700 In: 840 [P.O.:840] Out: 2950 [Urine:2950]  PHYSICAL EXAM She is awake and alert. She looks mildly short of breath at rest. HEENT exam is generally  unremarkable. Her neck is supple without masses. Her chest shows some rales in the bases bilaterally. Her heart is irregular without gallop. Her abdomen is soft. Extremities showed trace edema. Central nervous system examination is grossly intact  Lab Results: Basic Metabolic Panel:  Recent Labs  04/01/13 0637 04/02/13 0555  NA 140 139  K 4.1 4.1  CL 106 103  CO2 26 28  GLUCOSE 88 95  BUN 13 19  CREATININE 0.91 1.01  CALCIUM 8.9 9.1   Liver Function Tests:  Recent Labs  03/31/13 1550  AST 29  ALT 22  ALKPHOS 111  BILITOT 0.7  PROT 8.1  ALBUMIN 3.7   No results found for this basename: LIPASE, AMYLASE,  in the last 72 hours No results found for this basename: AMMONIA,  in the last 72 hours CBC:  Recent Labs  03/31/13 1550 04/01/13 0637 04/02/13 0939  WBC 4.8 4.1 4.4  NEUTROABS 3.3 2.7  --   HGB 11.5* 9.8* 10.4*  HCT 36.3 30.8* 32.9*  MCV 91.2 91.9 91.6  PLT 229 195 195   Cardiac Enzymes:  Recent Labs  03/31/13 1550  TROPONINI <0.30   BNP:  Recent Labs  03/31/13 1550  PROBNP 1241.0*   D-Dimer: No results found for this basename: DDIMER,  in the last 72 hours CBG: No results found for this basename: GLUCAP,  in the  last 72 hours Hemoglobin A1C: No results found for this basename: HGBA1C,  in the last 72 hours Fasting Lipid Panel: No results found for this basename: CHOL, HDL, LDLCALC, TRIG, CHOLHDL, LDLDIRECT,  in the last 72 hours Thyroid Function Tests:  Recent Labs  03/31/13 1550  TSH 7.777*   Anemia Panel: No results found for this basename: VITAMINB12, FOLATE, FERRITIN, TIBC, IRON, RETICCTPCT,  in the last 72 hours Coagulation: No results found for this basename: LABPROT, INR,  in the last 72 hours Urine Drug Screen: Drugs of Abuse  No results found for this basename: labopia, cocainscrnur, labbenz, amphetmu, thcu, labbarb    Alcohol Level: No results found for this basename: ETH,  in the last 72 hours Urinalysis: No results  found for this basename: COLORURINE, APPERANCEUR, LABSPEC, PHURINE, GLUCOSEU, HGBUR, BILIRUBINUR, KETONESUR, PROTEINUR, UROBILINOGEN, NITRITE, LEUKOCYTESUR,  in the last 72 hours Misc. Labs:   ABGS: No results found for this basename: PHART, PCO2, PO2ART, TCO2, HCO3,  in the last 72 hours   MICROBIOLOGY: Recent Results (from the past 240 hour(s))  CULTURE, BLOOD (ROUTINE X 2)     Status: None   Collection Time    03/31/13  7:18 PM      Result Value Range Status   Specimen Description Blood BLOOD RIGHT HAND   Final   Special Requests BOTTLES DRAWN AEROBIC ONLY 6CC   Final   Culture NO GROWTH 2 DAYS   Final   Report Status PENDING   Incomplete  CULTURE, BLOOD (ROUTINE X 2)     Status: None   Collection Time    03/31/13  7:24 PM      Result Value Range Status   Specimen Description Blood LEFT ANTECUBITAL   Final   Special Requests BOTTLES DRAWN AEROBIC AND ANAEROBIC 8CC   Final   Culture NO GROWTH 2 DAYS   Final   Report Status PENDING   Incomplete    Studies/Results: Dg Chest 2 View  03/31/2013   CLINICAL DATA:  Increased shortness of breath.  Recent pneumonia.  EXAM: CHEST  2 VIEW  COMPARISON:  None.  FINDINGS: The heart is mildly enlarged. Bilateral lower lobe interstitial and airspace disease is present, left greater than right. Effusions are evident. Atherosclerotic calcifications are present at the aortic arch. Thoracic kyphosis is exaggerated.  IMPRESSION: 1. Bilateral interstitial and airspace disease, left greater than right. This is highly concerning for pneumonia. 2. Bilateral pleural effusions are also worse on the left. 3. Exaggerated thoracic kyphosis. 4. Mild cardiac enlargement.   Electronically Signed   By: Lawrence Santiago M.D.   On: 03/31/2013 15:25   Ct Angio Chest Pe W/cm &/or Wo Cm  03/31/2013   CLINICAL DATA:  Short of breath  EXAM: CT ANGIOGRAPHY CHEST WITH CONTRAST  TECHNIQUE: Multidetector CT imaging of the chest was performed using the standard protocol during  bolus administration of intravenous contrast. Multiplanar CT image reconstructions including MIPs were obtained to evaluate the vascular anatomy.  CONTRAST:  177mL OMNIPAQUE IOHEXOL 350 MG/ML SOLN  COMPARISON:  None.  FINDINGS: No filling defects in the pulmonary arterial tree to suggest acute pulmonary thromboembolism.  Mild atherosclerotic changes of the aortic arch. No evidence of dissection or aneurysm.  Abnormal mediastinal adenopathy is present. Precarinal nodal mass measures 2.0 x 3.3 cm. 11 mm AP window node. 11 mm right hilar node. 10 mm left hilar node. Several small nodes between the innominate artery and right innominate vein are noted.  Bilateral small pleural effusions are present.  There is anterior extension suggesting an element of loculation.  There is wall thickening and/or edema of the entire length of the esophagus.  No pneumothorax.  There are diffuse pulmonary parenchymal changes with a predilection for the lung bases characterize by interlobular septal thickening and heterogeneous opacities. No nodular lung disease. No cystic changes. Some areas of ground-glass are visualized.  Benign appearing liver cysts.  Kyphosis of the thoracic spine.  Review of the MIP images confirms the above findings.  IMPRESSION: No evidence of acute pulmonary thromboembolism.  Bilateral pleural effusions with evidence of loculation.  Bilateral interstitial lung disease as described. This may represent chronic disease or acute inflammatory process.  Abnormal mediastinal adenopathy. Differential diagnosis includes volume overload, inflammatory disease, or malignancy.   Electronically Signed   By: Maryclare Bean M.D.   On: 03/31/2013 18:27    Medications:  Prior to Admission:  Prescriptions prior to admission  Medication Sig Dispense Refill  . acetaminophen (TYLENOL) 500 MG tablet Take 1,000 mg by mouth every 6 (six) hours as needed.      Marland Kitchen albuterol (PROVENTIL HFA;VENTOLIN HFA) 108 (90 BASE) MCG/ACT inhaler Inhale  1-2 puffs into the lungs every 6 (six) hours as needed for wheezing or shortness of breath.      Marland Kitchen amiodarone (PACERONE) 200 MG tablet Take 200 mg by mouth daily.      Marland Kitchen amLODipine (NORVASC) 5 MG tablet Take 5 mg by mouth daily.      Marland Kitchen aspirin EC 81 MG tablet Take 81 mg by mouth daily.      Marland Kitchen escitalopram (LEXAPRO) 20 MG tablet Take 20 mg by mouth at bedtime.      Marland Kitchen estradiol (ESTRACE) 2 MG tablet Take 2 mg by mouth at bedtime.      . furosemide (LASIX) 40 MG tablet Take 40 mg by mouth daily.      Marland Kitchen levothyroxine (SYNTHROID, LEVOTHROID) 100 MCG tablet Take 100 mcg by mouth daily before breakfast.      . lisinopril (PRINIVIL,ZESTRIL) 20 MG tablet Take 20 mg by mouth 2 (two) times daily.      . magnesium oxide (MAG-OX) 400 MG tablet Take 400 mg by mouth daily.      . meloxicam (MOBIC) 15 MG tablet Take 15 mg by mouth at bedtime.      . metoprolol succinate (TOPROL-XL) 100 MG 24 hr tablet Take 100 mg by mouth daily. Take with or immediately following a meal.      . omeprazole (PRILOSEC) 40 MG capsule Take 40 mg by mouth daily.      . rizatriptan (MAXALT-MLT) 10 MG disintegrating tablet Take 10 mg by mouth as needed for migraine. May repeat in 2 hours if needed       Scheduled: . amiodarone  200 mg Oral Daily  . amLODipine  5 mg Oral Daily  . aspirin EC  81 mg Oral Daily  . ceFEPime (MAXIPIME) IV  1 g Intravenous Q8H  . enoxaparin (LOVENOX) injection  40 mg Subcutaneous Q24H  . escitalopram  20 mg Oral QHS  . estradiol  2 mg Oral QHS  . furosemide  40 mg Oral Daily  . levothyroxine  100 mcg Oral QAC breakfast  . lisinopril  20 mg Oral BID  . meloxicam  15 mg Oral QHS  . metoprolol succinate  100 mg Oral Daily  . pantoprazole  40 mg Oral Daily  . sodium chloride  3 mL Intravenous Q12H  . vancomycin  750 mg Intravenous Q12H  Continuous:  BPZ:WCHENI chloride, albuterol, sodium chloride  Assesment: She is admitted with acute respiratory failure with hypoxia. She has small bilateral  pleural effusions. CT report was read as showing that the effusion was perhaps loculated and I have reviewed that with Dr. Cyndia Bent cardio thoracic surgeon and he and I both agree that the effusions do not appear to be loculated to [place]. She does have what looks like some interstitial pulmonary disease on CT and that may be related to amiodarone that she's taking for her atrial fibrillation. Principal Problem:   Acute respiratory failure with hypoxia Active Problems:   Hypertension   Thyroid disease   Atrial fibrillation   SOB (shortness of breath)   Mediastinal adenopathy   Pleural effusion, bilateral   Hypoxia   Kyphosis deformity of spine    Plan: Her records have been sent for. I will discuss her situation with her pulmonary physician in [place] tomorrow. If this is a new finding of interstitial lung disease I think it would be more likely that this is related to amiodarone. She should be treated for pneumonia as is underway. I don't think she needs any sort of drainage procedure on the pleural effusions right now. She may want to return to [place] after she improves and have further workup such as lung biopsy etc. if needed. She intends to move here in the near future    LOS: 2 days   Leighanne Adolph L 04/02/2013, 10:28 AM

## 2013-04-02 NOTE — Progress Notes (Addendum)
[facility] PROGRESS NOTE  Diane Cohen [id number] DOB: 1945/04/08 DOA: 03/31/2013 PCP: Provider Not In System Brief HPI: 68 yo female h/o afib, htn traveled here from [place] to visit her dtr 3 days ago. Has been having worsening sob esp with exertion since. No significant leg swelling or edema. No fevers. Having a dry nonproductive cough, no hemoptysis. No cp. Was tx for pna with doxycyline? She thinks in November for about a week then ended up getting hospitalized in [place] the week before thanksgiving for 6 days with pna. When she was dc from hosp she was feeling better, and was not d/c on any more abx. Since that time she has been recovering well, no sob. No fevers since. She has seen a pulmonologist several times since that hospitalization who has been doing serial chest xrays, she says that she was told by her pulmonologist that "there was still something there, but it was looking better. She was admitted for further evaluation of acute resp failure.   Assessment/Plan: 1. Recurrent Pneumonia? Acute respiratory failure with hypoxia.  Is being treated for health care associated pneumonia.  On IV vancomyicn and IV cefepime.  Blood and sputum cultures are pending.  Urine for legionella and streptococcal antigen are negative.  - un explained bilateral effusions, echocardiogram shows diastolic heart failure. On IV diuresis.  -  Need to evaluate for restrictive lung disease vs pulm toxicity from amiodarone.  - will await pulm recommendations.  - oxygen to keep sat's greater then 90% - tsh  pending.   Hypertension   Atrial fibrillation : rate controlled on amiodarone.   Hypothyroidism: on synthroid. tsh pending.    DVT prophylaxis.   Code Status: full code Family Communication: none at bedside, plan discussed with pt.and daughter.  Disposition Plan: pending.    Consultants:  Pulmonary consult.   Procedures:  CT angio  echocardiogram Antibiotics                 Cefepime 1 /24 Vancomycin 1/24 HPI/Subjective: Pleasant mood. No new complaints. Feel sbetter than yesterday.   Objective: Filed Vitals:   04/02/13 1510  BP: 120/61  Pulse: 66  Temp: 97.5 F (36.4 C)  Resp: 20    Intake/Output Summary (Last 24 hours) at 04/02/13 1558 Last data filed at 04/01/13 2157  Gross per 24 hour  Intake    360 ml  Output   1050 ml  Net   -690 ml   Filed Weights   03/31/13 1444  Weight: 82.101 kg (181 lb)    Exam:   General:  Alert afebrile comfortable  Cardiovascular: s1s2  Respiratory: decreased air entry bilateral  Abdomen: soft NT ND BS+  Musculoskeletal: TRACE PEDAL EDEMA  Data Reviewed: Basic Metabolic Panel:  Recent Labs Lab 03/31/13 1550 04/01/13 0637 04/02/13 0555  NA 141 140 139  K 4.3 4.1 4.1  CL 105 106 103  CO2 27 26 28   GLUCOSE 89 88 95  BUN 15 13 19   CREATININE 0.94 0.91 1.01  CALCIUM 9.9 8.9 9.1   Liver Function Tests:  Recent Labs Lab 03/31/13 1550  AST 29  ALT 22  ALKPHOS 111  BILITOT 0.7  PROT 8.1  ALBUMIN 3.7   No results found for this basename: LIPASE, AMYLASE,  in the last 168 hours No results found for this basename: AMMONIA,  in the last 168 hours CBC:  Recent Labs Lab 03/31/13 1550 04/01/13 0637 04/02/13 0939  WBC 4.8 4.1 4.4  NEUTROABS 3.3 2.7  --  HGB 11.5* 9.8* 10.4*  HCT 36.3 30.8* 32.9*  MCV 91.2 91.9 91.6  PLT 229 195 195   Cardiac Enzymes:  Recent Labs Lab 03/31/13 1550  TROPONINI <0.30   BNP (last 3 results)  Recent Labs  03/31/13 1550  PROBNP 1241.0*   CBG: No results found for this basename: GLUCAP,  in the last 168 hours  Recent Results (from the past 240 hour(s))  CULTURE, BLOOD (ROUTINE X 2)     Status: None   Collection Time    03/31/13  7:18 PM      Result Value Range Status   Specimen Description Blood BLOOD RIGHT HAND   Final   Special Requests BOTTLES DRAWN AEROBIC ONLY 6CC   Final   Culture NO GROWTH 2 DAYS   Final   Report Status PENDING    Incomplete  CULTURE, BLOOD (ROUTINE X 2)     Status: None   Collection Time    03/31/13  7:24 PM      Result Value Range Status   Specimen Description Blood LEFT ANTECUBITAL   Final   Special Requests BOTTLES DRAWN AEROBIC AND ANAEROBIC 8CC   Final   Culture NO GROWTH 2 DAYS   Final   Report Status PENDING   Incomplete     Studies: Ct Angio Chest Pe W/cm &/or Wo Cm  03/31/2013   CLINICAL DATA:  Short of breath  EXAM: CT ANGIOGRAPHY CHEST WITH CONTRAST  TECHNIQUE: Multidetector CT imaging of the chest was performed using the standard protocol during bolus administration of intravenous contrast. Multiplanar CT image reconstructions including MIPs were obtained to evaluate the vascular anatomy.  CONTRAST:  161mL OMNIPAQUE IOHEXOL 350 MG/ML SOLN  COMPARISON:  None.  FINDINGS: No filling defects in the pulmonary arterial tree to suggest acute pulmonary thromboembolism.  Mild atherosclerotic changes of the aortic arch. No evidence of dissection or aneurysm.  Abnormal mediastinal adenopathy is present. Precarinal nodal mass measures 2.0 x 3.3 cm. 11 mm AP window node. 11 mm right hilar node. 10 mm left hilar node. Several small nodes between the innominate artery and right innominate vein are noted.  Bilateral small pleural effusions are present. There is anterior extension suggesting an element of loculation.  There is wall thickening and/or edema of the entire length of the esophagus.  No pneumothorax.  There are diffuse pulmonary parenchymal changes with a predilection for the lung bases characterize by interlobular septal thickening and heterogeneous opacities. No nodular lung disease. No cystic changes. Some areas of ground-glass are visualized.  Benign appearing liver cysts.  Kyphosis of the thoracic spine.  Review of the MIP images confirms the above findings.  IMPRESSION: No evidence of acute pulmonary thromboembolism.  Bilateral pleural effusions with evidence of loculation.  Bilateral interstitial  lung disease as described. This may represent chronic disease or acute inflammatory process.  Abnormal mediastinal adenopathy. Differential diagnosis includes volume overload, inflammatory disease, or malignancy.   Electronically Signed   By: Maryclare Bean M.D.   On: 03/31/2013 18:27    Scheduled Meds: . amiodarone  200 mg Oral Daily  . amLODipine  5 mg Oral Daily  . aspirin EC  81 mg Oral Daily  . ceFEPime (MAXIPIME) IV  1 g Intravenous Q8H  . enoxaparin (LOVENOX) injection  40 mg Subcutaneous Q24H  . escitalopram  20 mg Oral QHS  . estradiol  2 mg Oral QHS  . furosemide  40 mg Oral Daily  . levothyroxine  100 mcg Oral QAC breakfast  .  lisinopril  20 mg Oral BID  . meloxicam  15 mg Oral QHS  . metoprolol succinate  100 mg Oral Daily  . pantoprazole  40 mg Oral Daily  . sodium chloride  3 mL Intravenous Q12H  . vancomycin  750 mg Intravenous Q12H   Continuous Infusions:   Principal Problem:   Acute respiratory failure with hypoxia Active Problems:   Hypertension   Thyroid disease   Atrial fibrillation   SOB (shortness of breath)   Mediastinal adenopathy   Pleural effusion, bilateral   Hypoxia   Kyphosis deformity of spine    Time spent: 25 min    Shyler Hamill  [facility] Pager [phone] 7PM-7AM, please contact night-coverage at [email or web address], password [facility] 04/02/2013, 3:58 PM  LOS: 2 days

## 2013-04-03 LAB — BASIC METABOLIC PANEL
BUN: 21 mg/dL (ref 6–23)
CHLORIDE: 100 meq/L (ref 96–112)
CO2: 31 meq/L (ref 19–32)
CREATININE: 1.12 mg/dL — AB (ref 0.50–1.10)
Calcium: 8.9 mg/dL (ref 8.4–10.5)
GFR calc Af Amer: 58 mL/min — ABNORMAL LOW (ref >90)
GFR calc non Af Amer: 50 mL/min — ABNORMAL LOW (ref >90)
GLUCOSE: 90 mg/dL (ref 70–99)
Potassium: 3.9 mEq/L (ref 3.7–5.3)
Sodium: 140 mEq/L (ref 137–147)

## 2013-04-03 LAB — VANCOMYCIN, TROUGH: VANCOMYCIN TR: 16.3 ug/mL (ref 10.0–20.0)

## 2013-04-03 MED ORDER — METOPROLOL SUCCINATE ER 50 MG PO TB24
50.0000 mg | ORAL_TABLET | Freq: Every day | ORAL | Status: DC
  Administered 2013-04-04 – 2013-04-05 (×2): 50 mg via ORAL
  Filled 2013-04-03 (×2): qty 1

## 2013-04-03 MED ORDER — LISINOPRIL 10 MG PO TABS
20.0000 mg | ORAL_TABLET | Freq: Every day | ORAL | Status: DC
  Administered 2013-04-04 – 2013-04-05 (×2): 20 mg via ORAL
  Filled 2013-04-03 (×2): qty 2

## 2013-04-03 NOTE — Progress Notes (Signed)
Discussed patients low BP medications and multiple medications with Dr. Tana Coast. Will give amiodarone as ordered and hold other medications until otherwise told by physician

## 2013-04-03 NOTE — Progress Notes (Signed)
ANTIBIOTIC CONSULT NOTE - Pharmacy Consult for Vancomycin & Renal adjustment antibiotics Indication: pneumonia  Allergies  Allergen Reactions  . Levaquin [Levofloxacin] Other (See Comments)    Headache   . Sulfa Antibiotics Hives   Patient Measurements: Height: 5\' 2"  (157.5 cm) Weight: 181 lb (82.101 kg) IBW/kg (Calculated) : 50.1  Vital Signs: BP: 110/63 mmHg (01/26 1017) Pulse Rate: 63 (01/26 1017) Intake/Output from previous day: 01/25 0701 - 01/26 0700 In: -  Out: 1300 [Urine:1300] Intake/Output from this shift:    Labs:  Recent Labs  03/31/13 1550 04/01/13 0637 04/02/13 0555 04/02/13 0939 04/03/13 0526  WBC 4.8 4.1  --  4.4  --   HGB 11.5* 9.8*  --  10.4*  --   PLT 229 195  --  195  --   CREATININE 0.94 0.91 1.01  --  1.12*   Estimated Creatinine Clearance: 48.4 ml/min (by C-G formula based on Cr of 1.12).  Recent Labs  04/03/13 0924  [facility] 16.3    Microbiology: Recent Results (from the past 720 hour(s))  CULTURE, BLOOD (ROUTINE X 2)     Status: None   Collection Time    03/31/13  7:18 PM      Result Value Range Status   Specimen Description Blood BLOOD RIGHT HAND   Final   Special Requests BOTTLES DRAWN AEROBIC ONLY 6CC   Final   Culture NO GROWTH 3 DAYS   Final   Report Status PENDING   Incomplete  CULTURE, BLOOD (ROUTINE X 2)     Status: None   Collection Time    03/31/13  7:24 PM      Result Value Range Status   Specimen Description Blood LEFT ANTECUBITAL   Final   Special Requests BOTTLES DRAWN AEROBIC AND ANAEROBIC 8CC   Final   Culture NO GROWTH 3 DAYS   Final   Report Status PENDING   Incomplete   Medical History: Past Medical History  Diagnosis Date  . Atrial fibrillation   . Hypertension   . Thyroid disease   . Pneumonia   . TIA (transient ischemic attack)    Medications:  Scheduled:  . amiodarone  200 mg Oral Daily  . amLODipine  5 mg Oral Daily  . aspirin EC  81 mg Oral Daily  . ceFEPime (MAXIPIME) IV  1 g  Intravenous Q8H  . enoxaparin (LOVENOX) injection  40 mg Subcutaneous Q24H  . escitalopram  20 mg Oral QHS  . estradiol  2 mg Oral QHS  . furosemide  40 mg Intravenous Daily  . levothyroxine  100 mcg Oral QAC breakfast  . lisinopril  20 mg Oral BID  . meloxicam  15 mg Oral QHS  . metoprolol succinate  100 mg Oral Daily  . pantoprazole  40 mg Oral Daily  . sodium chloride  3 mL Intravenous Q12H  . vancomycin  750 mg Intravenous Q12H   Assessment: Healthcare acquired pneumonia Patient previously hospitalized over Thanksgiving for pneumonia in [place] Vancomycin trough level is on target.  Afebrile.  Goal of Therapy:  Vancomycin trough level 15-20  Plan:  Continue Cefepime 1 GM IV every 8 hours Continue Vancomycin 750 mg IV every 12 hours Vancomycin trough level weekly. Monitor renal function Labs per protocol  Hart Robinsons A 04/03/2013,11:05 AM

## 2013-04-03 NOTE — Progress Notes (Signed)
Paperwork done to get records from [place]. Sales promotion account executive.

## 2013-04-03 NOTE — Progress Notes (Signed)
Patient ID: Diane Cohen  female  EQA:[id number]    DOB: 1945-09-11    DOA: 03/31/2013  PCP: Provider Not In System  Assessment/Plan: Principal Problem:   Acute respiratory failure with hypoxia with recent pneumonia, bilateral pleural effusion - Continue IV vancomycin and cefepime - Pulmonology, Dr. Luan Pulling to discuss with patient's physicians in [place] - 2-D echo done, EF 97-98%, grade 2 diastolic dysfunction, moderate MR, pulmonary htn with PA pressure of 50, currently on IV diuresis - Per pulmonology, her symptoms may be related to amiodarone, however still treat pneumonia, she may need lung biopsy when she returns to [place] for further workup. ?PFT's, HRCT chest ?, Defer to pulmonology  Active Problems:   Hypertension - Currently BP on the lower side, decrease metoprolol and lisinopril today    Thyroid disease - Continue Synthroid    Atrial fibrillation - Currently rate controlled, heart rate in 60s, decrease Toprol-XL to 50 mg daily, decrease lisinopril, continue amlodipine - Continue amiodarone  DVT Prophylaxis:  Code Status:  Family Communication:  Disposition:    Subjective: Denies any specific complaints, no chest pain, no fevers or chills  Objective: Weight change:   Intake/Output Summary (Last 24 hours) at 04/03/13 1220 Last data filed at 04/02/13 2105  Gross per 24 hour  Intake      0 ml  Output    300 ml  Net   -300 ml   Blood pressure 110/63, pulse 63, temperature 97.5 F (36.4 C), temperature source Oral, resp. rate 19, height 5\' 2"  (1.575 m), weight 82.101 kg (181 lb), SpO2 98.00%.  Physical Exam: General: Alert and awake, oriented x3, not in any acute distress. CVS: S1-S2 clear, no murmur rubs or gallops Chest: Bibasilar Rales Abdomen: soft nontender, nondistended, normal bowel sounds  Extremities: no cyanosis, clubbing or edema noted bilaterally Neuro: Cranial nerves II-XII intact, no focal neurological deficits  Lab Results: Basic Metabolic  Panel:  Recent Labs Lab 04/02/13 0555 04/03/13 0526  NA 139 140  K 4.1 3.9  CL 103 100  CO2 28 31  GLUCOSE 95 90  BUN 19 21  CREATININE 1.01 1.12*  CALCIUM 9.1 8.9   Liver Function Tests:  Recent Labs Lab 03/31/13 1550  AST 29  ALT 22  ALKPHOS 111  BILITOT 0.7  PROT 8.1  ALBUMIN 3.7   No results found for this basename: LIPASE, AMYLASE,  in the last 168 hours No results found for this basename: AMMONIA,  in the last 168 hours CBC:  Recent Labs Lab 04/01/13 0637 04/02/13 0939  WBC 4.1 4.4  NEUTROABS 2.7  --   HGB 9.8* 10.4*  HCT 30.8* 32.9*  MCV 91.9 91.6  PLT 195 195   Cardiac Enzymes:  Recent Labs Lab 03/31/13 1550  TROPONINI <0.30   BNP: No components found with this basename: POCBNP,  CBG: No results found for this basename: GLUCAP,  in the last 168 hours   Micro Results: Recent Results (from the past 240 hour(s))  CULTURE, BLOOD (ROUTINE X 2)     Status: None   Collection Time    03/31/13  7:18 PM      Result Value Range Status   Specimen Description Blood BLOOD RIGHT HAND   Final   Special Requests BOTTLES DRAWN AEROBIC ONLY 6CC   Final   Culture NO GROWTH 3 DAYS   Final   Report Status PENDING   Incomplete  CULTURE, BLOOD (ROUTINE X 2)     Status: None   Collection Time  03/31/13  7:24 PM      Result Value Range Status   Specimen Description Blood LEFT ANTECUBITAL   Final   Special Requests BOTTLES DRAWN AEROBIC AND ANAEROBIC 8CC   Final   Culture NO GROWTH 3 DAYS   Final   Report Status PENDING   Incomplete    Studies/Results: Dg Chest 2 View  03/31/2013   CLINICAL DATA:  Increased shortness of breath.  Recent pneumonia.  EXAM: CHEST  2 VIEW  COMPARISON:  None.  FINDINGS: The heart is mildly enlarged. Bilateral lower lobe interstitial and airspace disease is present, left greater than right. Effusions are evident. Atherosclerotic calcifications are present at the aortic arch. Thoracic kyphosis is exaggerated.  IMPRESSION: 1.  Bilateral interstitial and airspace disease, left greater than right. This is highly concerning for pneumonia. 2. Bilateral pleural effusions are also worse on the left. 3. Exaggerated thoracic kyphosis. 4. Mild cardiac enlargement.   Electronically Signed   By: Lawrence Santiago M.D.   On: 03/31/2013 15:25   Ct Angio Chest Pe W/cm &/or Wo Cm  03/31/2013   CLINICAL DATA:  Short of breath  EXAM: CT ANGIOGRAPHY CHEST WITH CONTRAST  TECHNIQUE: Multidetector CT imaging of the chest was performed using the standard protocol during bolus administration of intravenous contrast. Multiplanar CT image reconstructions including MIPs were obtained to evaluate the vascular anatomy.  CONTRAST:  140mL OMNIPAQUE IOHEXOL 350 MG/ML SOLN  COMPARISON:  None.  FINDINGS: No filling defects in the pulmonary arterial tree to suggest acute pulmonary thromboembolism.  Mild atherosclerotic changes of the aortic arch. No evidence of dissection or aneurysm.  Abnormal mediastinal adenopathy is present. Precarinal nodal mass measures 2.0 x 3.3 cm. 11 mm AP window node. 11 mm right hilar node. 10 mm left hilar node. Several small nodes between the innominate artery and right innominate vein are noted.  Bilateral small pleural effusions are present. There is anterior extension suggesting an element of loculation.  There is wall thickening and/or edema of the entire length of the esophagus.  No pneumothorax.  There are diffuse pulmonary parenchymal changes with a predilection for the lung bases characterize by interlobular septal thickening and heterogeneous opacities. No nodular lung disease. No cystic changes. Some areas of ground-glass are visualized.  Benign appearing liver cysts.  Kyphosis of the thoracic spine.  Review of the MIP images confirms the above findings.  IMPRESSION: No evidence of acute pulmonary thromboembolism.  Bilateral pleural effusions with evidence of loculation.  Bilateral interstitial lung disease as described. This may  represent chronic disease or acute inflammatory process.  Abnormal mediastinal adenopathy. Differential diagnosis includes volume overload, inflammatory disease, or malignancy.   Electronically Signed   By: Maryclare Bean M.D.   On: 03/31/2013 18:27    Medications: Scheduled Meds: . amiodarone  200 mg Oral Daily  . amLODipine  5 mg Oral Daily  . aspirin EC  81 mg Oral Daily  . ceFEPime (MAXIPIME) IV  1 g Intravenous Q8H  . enoxaparin (LOVENOX) injection  40 mg Subcutaneous Q24H  . escitalopram  20 mg Oral QHS  . estradiol  2 mg Oral QHS  . furosemide  40 mg Intravenous Daily  . levothyroxine  100 mcg Oral QAC breakfast  . [START ON 04/04/2013] lisinopril  20 mg Oral Daily  . meloxicam  15 mg Oral QHS  . [START ON 04/04/2013] metoprolol succinate  50 mg Oral Daily  . pantoprazole  40 mg Oral Daily  . sodium chloride  3 mL Intravenous Q12H  .  vancomycin  750 mg Intravenous Q12H      LOS: 3 days   Odysseus Cada M.D. [facility] 04/03/2013, 12:20 PM Pager: [phone]  If 7PM-7AM, please contact night-coverage [email or web address] Password [email or web address]

## 2013-04-04 ENCOUNTER — Inpatient Hospital Stay (HOSPITAL_COMMUNITY): Payer: Medicare PPO

## 2013-04-04 LAB — BASIC METABOLIC PANEL
BUN: 22 mg/dL (ref 6–23)
CHLORIDE: 99 meq/L (ref 96–112)
CO2: 31 meq/L (ref 19–32)
CREATININE: 1.1 mg/dL (ref 0.50–1.10)
Calcium: 9.3 mg/dL (ref 8.4–10.5)
GFR calc Af Amer: 59 mL/min — ABNORMAL LOW (ref >90)
GFR calc non Af Amer: 51 mL/min — ABNORMAL LOW (ref >90)
GLUCOSE: 91 mg/dL (ref 70–99)
Potassium: 3.6 mEq/L — ABNORMAL LOW (ref 3.7–5.3)
Sodium: 141 mEq/L (ref 137–147)

## 2013-04-04 LAB — SEDIMENTATION RATE: Sed Rate: 20 mm/hr (ref 0–22)

## 2013-04-04 MED ORDER — FUROSEMIDE 40 MG PO TABS
40.0000 mg | ORAL_TABLET | Freq: Every day | ORAL | Status: DC
  Administered 2013-04-05: 40 mg via ORAL
  Filled 2013-04-04: qty 1

## 2013-04-04 MED ORDER — POTASSIUM CHLORIDE CRYS ER 20 MEQ PO TBCR
40.0000 meq | EXTENDED_RELEASE_TABLET | Freq: Two times a day (BID) | ORAL | Status: AC
  Administered 2013-04-04 (×2): 40 meq via ORAL
  Filled 2013-04-04 (×2): qty 2

## 2013-04-04 NOTE — Progress Notes (Signed)
Patient ID: Diane Cohen  female  BDZ:[id number]    DOB: 06/17/1945    DOA: 03/31/2013  PCP: Provider Not In System Brief HPI: 68 yo female h/o afib, htn traveled here from [place] to visit her dtr 3 days ago. Has been having worsening sob esp with exertion since. No significant leg swelling or edema. No fevers. Having a dry nonproductive cough, no hemoptysis. No cp. Was tx for pna with doxycyline? She thinks in November for about a week then ended up getting hospitalized in [place] the week before thanksgiving for 6 days with pna. When she was dc from hosp she was feeling better, and was not d/c on any more abx. Since that time she has been recovering well, no sob. No fevers since. She has seen a pulmonologist several times since that hospitalization who has been doing serial chest xrays, she says that she was told by her pulmonologist that "there was still something there, but it was looking better. She was admitted for further evaluation of acute resp failure.  Assessment/Plan:    Acute respiratory failure with hypoxia with recent pneumonia, bilateral pleural effusion - Continue IV vancomycin and cefepime for one more day, to complete 5 days of IV antibiotics.  - Pulmonology, Dr. Luan Pulling has discussed with the pulm from [place], and recommended to monitor on antibiotics and amiodarone. Meanwhile she is being diuresed with IV lasix. Currently she reports feeling much better and her output has been about 5 litres from the day of admission. Repeat CXR shows improvement of the pulm infiltrates.  - 2-D echo done, EF 34-19%, grade 2 diastolic dysfunction, moderate MR, pulmonary htn with PA pressure of 50, currently on IV diuresis.  - Per pulmonology, her symptoms may be related to amiodarone, however still treat pneumonia, she may need lung biopsy when she returns to [place] for further workup. , Defer to pulmonology at [facility] on discharge.  - she reported she has an appointment with pulmonology at [facility]  on feb 16 th and cardiologist on March 3 rd. Recommended to follow up with those appointments after which she will move to [place], [place] and will follow up with Dr Luan Pulling for pulmonology and Dr Gwen Her for cardiology. She has the contact information of the above.      Hypertension - Currently BP on the lower side, decrease metoprolol and lisinopril today    Thyroid disease - Continue Synthroid    Atrial fibrillation - Currently rate controlled, heart rate in 60s, decrease Toprol-XL to 50 mg daily, decrease lisinopril, continue amlodipine - Continue amiodarone  DVT Prophylaxis:  Code Status: full code  Family Communication: discussed with daughter on 1/25  Disposition:possible d/c in am.     Subjective: Denies any specific complaints, no chest pain, no fevers or chills. Reports feeling much better.   Objective: Weight change:   Intake/Output Summary (Last 24 hours) at 04/04/13 1437 Last data filed at 04/04/13 0830  Gross per 24 hour  Intake    340 ml  Output   1100 ml  Net   -760 ml   Blood pressure 132/55, pulse 72, temperature 97.8 F (36.6 C), temperature source Oral, resp. rate 20, height 5\' 2"  (1.575 m), weight 82.101 kg (181 lb), SpO2 95.00%.  Physical Exam: General: Alert and awake, oriented x3, not in any acute distress. CVS: S1-S2 clear, no murmur rubs or gallops Chest: CTAB.  Abdomen: soft nontender, nondistended, normal bowel sounds  Extremities: no cyanosis, clubbing or edema noted bilaterally Neuro: Cranial nerves II-XII  intact, no focal neurological deficits  Lab Results: Basic Metabolic Panel:  Recent Labs Lab 04/03/13 0526 04/04/13 0542  NA 140 141  K 3.9 3.6*  CL 100 99  CO2 31 31  GLUCOSE 90 91  BUN 21 22  CREATININE 1.12* 1.10  CALCIUM 8.9 9.3   Liver Function Tests:  Recent Labs Lab 03/31/13 1550  AST 29  ALT 22  ALKPHOS 111  BILITOT 0.7  PROT 8.1  ALBUMIN 3.7   No results found for this basename: LIPASE, AMYLASE,  in  the last 168 hours No results found for this basename: AMMONIA,  in the last 168 hours CBC:  Recent Labs Lab 04/01/13 0637 04/02/13 0939  WBC 4.1 4.4  NEUTROABS 2.7  --   HGB 9.8* 10.4*  HCT 30.8* 32.9*  MCV 91.9 91.6  PLT 195 195   Cardiac Enzymes:  Recent Labs Lab 03/31/13 1550  TROPONINI <0.30   BNP: No components found with this basename: POCBNP,  CBG: No results found for this basename: GLUCAP,  in the last 168 hours   Micro Results: Recent Results (from the past 240 hour(s))  CULTURE, BLOOD (ROUTINE X 2)     Status: None   Collection Time    03/31/13  7:18 PM      Result Value Range Status   Specimen Description BLOOD RIGHT HAND   Final   Special Requests BOTTLES DRAWN AEROBIC ONLY 6CC   Final   Culture NO GROWTH 4 DAYS   Final   Report Status PENDING   Incomplete  CULTURE, BLOOD (ROUTINE X 2)     Status: None   Collection Time    03/31/13  7:24 PM      Result Value Range Status   Specimen Description BLOOD LEFT ANTECUBITAL   Final   Special Requests BOTTLES DRAWN AEROBIC AND ANAEROBIC 8CC   Final   Culture NO GROWTH 4 DAYS   Final   Report Status PENDING   Incomplete    Studies/Results: Dg Chest 2 View  03/31/2013   CLINICAL DATA:  Increased shortness of breath.  Recent pneumonia.  EXAM: CHEST  2 VIEW  COMPARISON:  None.  FINDINGS: The heart is mildly enlarged. Bilateral lower lobe interstitial and airspace disease is present, left greater than right. Effusions are evident. Atherosclerotic calcifications are present at the aortic arch. Thoracic kyphosis is exaggerated.  IMPRESSION: 1. Bilateral interstitial and airspace disease, left greater than right. This is highly concerning for pneumonia. 2. Bilateral pleural effusions are also worse on the left. 3. Exaggerated thoracic kyphosis. 4. Mild cardiac enlargement.   Electronically Signed   By: Lawrence Santiago M.D.   On: 03/31/2013 15:25   Ct Angio Chest Pe W/cm &/or Wo Cm  03/31/2013   CLINICAL DATA:  Short  of breath  EXAM: CT ANGIOGRAPHY CHEST WITH CONTRAST  TECHNIQUE: Multidetector CT imaging of the chest was performed using the standard protocol during bolus administration of intravenous contrast. Multiplanar CT image reconstructions including MIPs were obtained to evaluate the vascular anatomy.  CONTRAST:  154mL OMNIPAQUE IOHEXOL 350 MG/ML SOLN  COMPARISON:  None.  FINDINGS: No filling defects in the pulmonary arterial tree to suggest acute pulmonary thromboembolism.  Mild atherosclerotic changes of the aortic arch. No evidence of dissection or aneurysm.  Abnormal mediastinal adenopathy is present. Precarinal nodal mass measures 2.0 x 3.3 cm. 11 mm AP window node. 11 mm right hilar node. 10 mm left hilar node. Several small nodes between the innominate artery and right innominate  vein are noted.  Bilateral small pleural effusions are present. There is anterior extension suggesting an element of loculation.  There is wall thickening and/or edema of the entire length of the esophagus.  No pneumothorax.  There are diffuse pulmonary parenchymal changes with a predilection for the lung bases characterize by interlobular septal thickening and heterogeneous opacities. No nodular lung disease. No cystic changes. Some areas of ground-glass are visualized.  Benign appearing liver cysts.  Kyphosis of the thoracic spine.  Review of the MIP images confirms the above findings.  IMPRESSION: No evidence of acute pulmonary thromboembolism.  Bilateral pleural effusions with evidence of loculation.  Bilateral interstitial lung disease as described. This may represent chronic disease or acute inflammatory process.  Abnormal mediastinal adenopathy. Differential diagnosis includes volume overload, inflammatory disease, or malignancy.   Electronically Signed   By: Maryclare Bean M.D.   On: 03/31/2013 18:27    Medications: Scheduled Meds: . amiodarone  200 mg Oral Daily  . amLODipine  5 mg Oral Daily  . aspirin EC  81 mg Oral Daily  .  ceFEPime (MAXIPIME) IV  1 g Intravenous Q8H  . enoxaparin (LOVENOX) injection  40 mg Subcutaneous Q24H  . escitalopram  20 mg Oral QHS  . estradiol  2 mg Oral QHS  . furosemide  40 mg Intravenous Daily  . levothyroxine  100 mcg Oral QAC breakfast  . lisinopril  20 mg Oral Daily  . meloxicam  15 mg Oral QHS  . metoprolol succinate  50 mg Oral Daily  . pantoprazole  40 mg Oral Daily  . sodium chloride  3 mL Intravenous Q12H  . vancomycin  750 mg Intravenous Q12H      LOS: 4 days   Kamesha Herne M.D. [facility] 04/04/2013, 2:37 PM Pager: [phone]  If 7PM-7AM, please contact night-coverage [email or web address] Password [email or web address]

## 2013-04-04 NOTE — Progress Notes (Signed)
NAMEALTON, TREMBLAY                 ACCOUNT NO.:  [id number]  MEDICAL RECORD NO.:  [id number]  LOCATION:  A326                          FACILITY:  [facility]  PHYSICIAN:  Shelsea Hangartner L. Luan Pulling, M.D.DATE OF BIRTH:  [birth year]  DATE OF PROCEDURE: DATE OF DISCHARGE:                                PROGRESS NOTE   The patient of [facility].  HISTORY:  Ms. Yaun has improved somewhat.  She says she feels "block," but she is not short of breath.  She has no other new complaints.  PHYSICAL EXAMINATION:  VITAL SIGNS:  Her temperature is 97.5, pulse 66, respirations 20, O2 sats in the 90s. CHEST:  Pretty clear. HEART:  Irregular. ABDOMEN:  Soft. EXTREMITIES:  No edema. CENTRAL NERVOUS SYSTEM:  Grossly intact.  ASSESSMENT:  I discussed her situation with her Pulmonary physician in [place].  He says that she presented with an episode very similar to her current episode.  She was treated as an atypical pneumonia and improved. Her pulmonary infiltrates were resolving by the time of discharge, and she had another chest x-ray done prior to her coming here for a visit that had not totally resolved her infiltrate, but was better. Considering that it is thought less likely that this is related to amiodarone.  It has been planned that she would have further followup chest x-rays, etc., and if they were okay, then she would not need any further evaluation.  I think it is appropriate to continue with current treatments.  I would not change anything right now.  She could be treated for atypical pneumonia again, continue with other treatments, and perhaps discuss whether amiodarone is appropriate with Cardiology. Since she plans to move here, she wants to try to establish to some extent at least with physicians here prior to her move.     Lue [place] L. Luan Pulling, M.D.     ELH/MEDQ  D:  04/03/2013  T:  04/04/2013  Job:  [id number]

## 2013-04-04 NOTE — Progress Notes (Signed)
Subjective: She says she feels better. She has less shortness of breath and was able to ambulate some yesterday.  Objective: Vital signs in last 24 hours: Temp:  [97.8 F (36.6 C)-98.5 F (36.9 C)] 97.8 F (36.6 C) (01/27 0607) Pulse Rate:  [60-63] 60 (01/27 0607) Resp:  [20] 20 (01/27 0607) BP: (110-126)/(63-72) 113/66 mmHg (01/27 0607) SpO2:  [95 %-99 %] 95 % (01/27 0607) Weight change:  Last BM Date: 04/04/13  Intake/Output from previous day: 01/26 0701 - 01/27 0700 In: 1230 [P.O.:480; IV Piggyback:750] Out: 2300 [Urine:2300]  PHYSICAL EXAM General appearance: alert, cooperative and no distress Resp: rales bilaterally Cardio: I can't tell by exam if she is in atrial fibrillation or not GI: soft, non-tender; bowel sounds normal; no masses,  no organomegaly Extremities: extremities normal, atraumatic, no cyanosis or edema  Lab Results:    Basic Metabolic Panel:  Recent Labs  04/03/13 0526 04/04/13 0542  NA 140 141  K 3.9 3.6*  CL 100 99  CO2 31 31  GLUCOSE 90 91  BUN 21 22  CREATININE 1.12* 1.10  CALCIUM 8.9 9.3   Liver Function Tests: No results found for this basename: AST, ALT, ALKPHOS, BILITOT, PROT, ALBUMIN,  in the last 72 hours No results found for this basename: LIPASE, AMYLASE,  in the last 72 hours No results found for this basename: AMMONIA,  in the last 72 hours CBC:  Recent Labs  04/02/13 0939  WBC 4.4  HGB 10.4*  HCT 32.9*  MCV 91.6  PLT 195   Cardiac Enzymes: No results found for this basename: CKTOTAL, CKMB, CKMBINDEX, TROPONINI,  in the last 72 hours BNP: No results found for this basename: PROBNP,  in the last 72 hours D-Dimer: No results found for this basename: DDIMER,  in the last 72 hours CBG: No results found for this basename: GLUCAP,  in the last 72 hours Hemoglobin A1C: No results found for this basename: HGBA1C,  in the last 72 hours Fasting Lipid Panel: No results found for this basename: CHOL, HDL, LDLCALC, TRIG,  CHOLHDL, LDLDIRECT,  in the last 72 hours Thyroid Function Tests: No results found for this basename: TSH, T4TOTAL, FREET4, T3FREE, THYROIDAB,  in the last 72 hours Anemia Panel: No results found for this basename: VITAMINB12, FOLATE, FERRITIN, TIBC, IRON, RETICCTPCT,  in the last 72 hours Coagulation: No results found for this basename: LABPROT, INR,  in the last 72 hours Urine Drug Screen: Drugs of Abuse  No results found for this basename: labopia, cocainscrnur, labbenz, amphetmu, thcu, labbarb    Alcohol Level: No results found for this basename: ETH,  in the last 72 hours Urinalysis: No results found for this basename: COLORURINE, APPERANCEUR, LABSPEC, PHURINE, GLUCOSEU, HGBUR, BILIRUBINUR, KETONESUR, PROTEINUR, UROBILINOGEN, NITRITE, LEUKOCYTESUR,  in the last 72 hours Misc. Labs:  ABGS No results found for this basename: PHART, PCO2, PO2ART, TCO2, HCO3,  in the last 72 hours CULTURES Recent Results (from the past 240 hour(s))  CULTURE, BLOOD (ROUTINE X 2)     Status: None   Collection Time    03/31/13  7:18 PM      Result Value Range Status   Specimen Description Blood BLOOD RIGHT HAND   Final   Special Requests BOTTLES DRAWN AEROBIC ONLY 6CC   Final   Culture NO GROWTH 3 DAYS   Final   Report Status PENDING   Incomplete  CULTURE, BLOOD (ROUTINE X 2)     Status: None   Collection Time    03/31/13  7:24 PM      Result Value Range Status   Specimen Description Blood LEFT ANTECUBITAL   Final   Special Requests BOTTLES DRAWN AEROBIC AND ANAEROBIC 8CC   Final   Culture NO GROWTH 3 DAYS   Final   Report Status PENDING   Incomplete   Studies/Results: No results found.  Medications:  Prior to Admission:  Prescriptions prior to admission  Medication Sig Dispense Refill  . acetaminophen (TYLENOL) 500 MG tablet Take 1,000 mg by mouth every 6 (six) hours as needed.      Marland Kitchen albuterol (PROVENTIL HFA;VENTOLIN HFA) 108 (90 BASE) MCG/ACT inhaler Inhale 1-2 puffs into the lungs  every 6 (six) hours as needed for wheezing or shortness of breath.      Marland Kitchen amiodarone (PACERONE) 200 MG tablet Take 200 mg by mouth daily.      Marland Kitchen amLODipine (NORVASC) 5 MG tablet Take 5 mg by mouth daily.      Marland Kitchen aspirin EC 81 MG tablet Take 81 mg by mouth daily.      Marland Kitchen escitalopram (LEXAPRO) 20 MG tablet Take 20 mg by mouth at bedtime.      Marland Kitchen estradiol (ESTRACE) 2 MG tablet Take 2 mg by mouth at bedtime.      . furosemide (LASIX) 40 MG tablet Take 40 mg by mouth daily.      Marland Kitchen levothyroxine (SYNTHROID, LEVOTHROID) 100 MCG tablet Take 100 mcg by mouth daily before breakfast.      . lisinopril (PRINIVIL,ZESTRIL) 20 MG tablet Take 20 mg by mouth 2 (two) times daily.      . magnesium oxide (MAG-OX) 400 MG tablet Take 400 mg by mouth daily.      . meloxicam (MOBIC) 15 MG tablet Take 15 mg by mouth at bedtime.      . metoprolol succinate (TOPROL-XL) 100 MG 24 hr tablet Take 100 mg by mouth daily. Take with or immediately following a meal.      . omeprazole (PRILOSEC) 40 MG capsule Take 40 mg by mouth daily.      . rizatriptan (MAXALT-MLT) 10 MG disintegrating tablet Take 10 mg by mouth as needed for migraine. May repeat in 2 hours if needed       Scheduled: . amiodarone  200 mg Oral Daily  . amLODipine  5 mg Oral Daily  . aspirin EC  81 mg Oral Daily  . ceFEPime (MAXIPIME) IV  1 g Intravenous Q8H  . enoxaparin (LOVENOX) injection  40 mg Subcutaneous Q24H  . escitalopram  20 mg Oral QHS  . estradiol  2 mg Oral QHS  . furosemide  40 mg Intravenous Daily  . levothyroxine  100 mcg Oral QAC breakfast  . lisinopril  20 mg Oral Daily  . meloxicam  15 mg Oral QHS  . metoprolol succinate  50 mg Oral Daily  . pantoprazole  40 mg Oral Daily  . sodium chloride  3 mL Intravenous Q12H  . vancomycin  750 mg Intravenous Q12H   Continuous:  JSE:GBTDVV chloride, albuterol, sodium chloride  Assesment: She is admitted with acute respiratory failure with hypoxia. I think some of this may be related to  diastolic heart failure based on her echocardiogram report. She has atrial fibrillation and has been on amiodarone. She has interstitial changes on CT and those were seen about 3 months ago during a hospitalization in [place]. I think based on her response then that this may be atypical pneumonia/some element of diastolic heart failure. There is still a  possibility that this is related to amiodarone. However it seemed to improve while she was still on that medication for think that's less likely. She feels better. Principal Problem:   Acute respiratory failure with hypoxia Active Problems:   Hypertension   Thyroid disease   Atrial fibrillation   SOB (shortness of breath)   Mediastinal adenopathy   Pleural effusion, bilateral   Hypoxia   Kyphosis deformity of spine    Plan: She's going to ambulate in the halls. She's going to have a repeat chest x-ray. Her sedimentation rate was very high when she was in the hospital in [place] so that will be rechecked but I think it's nonspecific    LOS: 4 days   Diane Cohen L 04/04/2013, 8:58 AM

## 2013-04-05 LAB — BASIC METABOLIC PANEL
BUN: 18 mg/dL (ref 6–23)
CHLORIDE: 101 meq/L (ref 96–112)
CO2: 29 mEq/L (ref 19–32)
CREATININE: 0.96 mg/dL (ref 0.50–1.10)
Calcium: 9.3 mg/dL (ref 8.4–10.5)
GFR calc Af Amer: 69 mL/min — ABNORMAL LOW (ref >90)
GFR calc non Af Amer: 60 mL/min — ABNORMAL LOW (ref >90)
Glucose, Bld: 86 mg/dL (ref 70–99)
Potassium: 4.4 mEq/L (ref 3.7–5.3)
Sodium: 138 mEq/L (ref 137–147)

## 2013-04-05 LAB — CULTURE, BLOOD (ROUTINE X 2)
Culture: NO GROWTH
Culture: NO GROWTH

## 2013-04-05 MED ORDER — AMOXICILLIN-POT CLAVULANATE 875-125 MG PO TABS
1.0000 | ORAL_TABLET | Freq: Two times a day (BID) | ORAL | Status: DC
  Administered 2013-04-05: 1 via ORAL
  Filled 2013-04-05: qty 1

## 2013-04-05 MED ORDER — AMOXICILLIN-POT CLAVULANATE 875-125 MG PO TABS: 1.0000 | ORAL_TABLET | Freq: Two times a day (BID) | ORAL | Status: DC

## 2013-04-05 MED ORDER — DOXYCYCLINE HYCLATE 100 MG PO TABS
100.0000 mg | ORAL_TABLET | Freq: Two times a day (BID) | ORAL | Status: DC
  Administered 2013-04-05: 100 mg via ORAL
  Filled 2013-04-05: qty 1

## 2013-04-05 MED ORDER — LISINOPRIL 20 MG PO TABS: 20.0000 mg | ORAL_TABLET | Freq: Every day | ORAL | Status: DC

## 2013-04-05 MED ORDER — DOXYCYCLINE HYCLATE 100 MG PO TABS: 100.0000 mg | ORAL_TABLET | Freq: Two times a day (BID) | ORAL | Status: DC

## 2013-04-05 NOTE — Care Management Note (Signed)
    Page 1 of 1   04/05/2013     1:42:08 PM   CARE MANAGEMENT NOTE 04/05/2013  Patient:  Diane Cohen, Diane Cohen   Account Number:  [id number]  Date Initiated:  04/05/2013  Documentation initiated by:  Claretha Cooper  Subjective/Objective Assessment:   Pt from [place] visiting family. Plans to return to [place] on Friday and move here in the new future. She will resume care with Dr. Luan Pulling as PCP and Pulmonology specialist.     Action/Plan:   No HH/Dme needs identified   Anticipated DC Date:  04/05/2013   Anticipated DC Plan:  [place]  CM consult      Choice offered to / List presented to:             Status of service:  Completed, signed off Medicare Important Message given?   (If response is "NO", the following Medicare IM given date fields will be blank) Date Medicare IM given:   Date Additional Medicare IM given:    Discharge Disposition:    Per UR Regulation:    If discussed at Long Length of Stay Meetings, dates discussed:    Comments:  04/05/13 Claretha Cooper RN BSN CM

## 2013-04-05 NOTE — Discharge Summary (Signed)
Physician Discharge Summary  Patient ID: NICLOE FRONTERA MRN: [id number] DOB/AGE: 06-19-1945 68 y.o.  Admit date: 03/31/2013 Discharge date: 04/05/2013  Primary Care Physician:  Provider Not In System  Discharge Diagnoses:   Acute hypoxic respiratory failure - improved, multifactorial with atypical pneumonia and diastolic CHF  . Thyroid disease . Hypertension . Atrial fibrillation . SOB (shortness of breath) . Acute respiratory failure with hypoxia . Mediastinal adenopathy . Pleural effusion, bilateral . Kyphosis deformity of spine  Consults: Pulmonology, Dr. Luan Pulling   Recommendations for Outpatient Follow-up:  Patient will continue further followup and workup (may need lung biopsy) in [place] with her pulmonologist and cardiologist.  Allergies:   Allergies  Allergen Reactions  . Levaquin [Levofloxacin] Other (See Comments)    Headache   . Sulfa Antibiotics Hives     Discharge Medications:   Medication List         acetaminophen 500 MG tablet  Commonly known as:  TYLENOL  Take 1,000 mg by mouth every 6 (six) hours as needed.     albuterol 108 (90 BASE) MCG/ACT inhaler  Commonly known as:  PROVENTIL HFA;VENTOLIN HFA  Inhale 1-2 puffs into the lungs every 6 (six) hours as needed for wheezing or shortness of breath.     amiodarone 200 MG tablet  Commonly known as:  PACERONE  Take 200 mg by mouth daily.     amLODipine 5 MG tablet  Commonly known as:  NORVASC  Take 5 mg by mouth daily.     amoxicillin-clavulanate 875-125 MG per tablet  Commonly known as:  AUGMENTIN  Take 1 tablet by mouth 2 (two) times daily. X 1 week     aspirin EC 81 MG tablet  Take 81 mg by mouth daily.     doxycycline 100 MG tablet  Commonly known as:  VIBRA-TABS  Take 1 tablet (100 mg total) by mouth 2 (two) times daily. X 3 weeks     escitalopram 20 MG tablet  Commonly known as:  LEXAPRO  Take 20 mg by mouth at bedtime.     estradiol 2 MG tablet  Commonly known as:  ESTRACE  Take  2 mg by mouth at bedtime.     furosemide 40 MG tablet  Commonly known as:  LASIX  Take 40 mg by mouth daily.     levothyroxine 100 MCG tablet  Commonly known as:  SYNTHROID, LEVOTHROID  Take 100 mcg by mouth daily before breakfast.     lisinopril 20 MG tablet  Commonly known as:  PRINIVIL,ZESTRIL  Take 20 mg by mouth 2 (two) times daily.     magnesium oxide 400 MG tablet  Commonly known as:  MAG-OX  Take 400 mg by mouth daily.     meloxicam 15 MG tablet  Commonly known as:  MOBIC  Take 15 mg by mouth at bedtime.     metoprolol succinate 100 MG 24 hr tablet  Commonly known as:  TOPROL-XL  Take 100 mg by mouth daily. Take with or immediately following a meal.     omeprazole 40 MG capsule  Commonly known as:  PRILOSEC  Take 40 mg by mouth daily.     rizatriptan 10 MG disintegrating tablet  Commonly known as:  MAXALT-MLT  Take 10 mg by mouth as needed for migraine. May repeat in 2 hours if needed         Brief H and P: For complete details please refer to admission H and P, but in brief patient is a 68 year old  female with history of rectal fibrillation, hypertension, who traveled here from [place] to visit her daughter 3 days prior to admission. Patient reported having worsening shortness of breath with exertion but no significant peripheral edema or any fevers. Patient also had a dry nonproductive cough but no hemoptysis no chest pain. Patient also reported that she was hospitalized in [place] in November for pneumonia. Since the discharge, she had been recovering well and had seen her pulmonologist several times.   Hospital Course:  Patient was admitted for further evaluation of the acute respiratory failure and possibly recurrent pneumonia. Acute respiratory failure with hypoxia with recent pneumonia, bilateral pleural effusion Patient completed IV vancomycin and cefepime for 5 days and subsequently discharged on oral antibiotics per pulmonology recommendations. Dr.  Luan Pulling from pulmonology also discussed with her pulmonologist in [place] and recommended to monitor on antibiotics and amiodarone. Meanwhile she was diuresed with IV lasix. Currently she reports feeling much better and her output has been about 5 litres from the day of admission. Repeat CXR shows improvement of the pulm infiltrates.  2-D echo was done which showed EF [id number], grade 2 diastolic dysfunction, moderate MR, pulmonary htn with PA pressure of 50. Per pulmonology, her symptoms may be related to amiodarone, however still treat pneumonia, she may need lung biopsy when she returns to [place] for further workup., Defer to pulmonology at [facility] on discharge. She reported that she has an appointment with pulmonology at [facility] on Feb16 th and cardiologist on March 3rd. Recommended to follow up with those appointments after which she will move to [place], [place] and will follow up with Dr Luan Pulling for pulmonology and Dr Gwen Her for cardiology. She has the contact information of the above.   Hypertension  - Currently BP was on the lower side, hence metoprolol and lisinopril were decreased inpatient.    Thyroid disease  - Continue Synthroid   Atrial fibrillation  - Currently rate controlled, amiodarone     Day of Discharge BP 130/60  Pulse 64  Temp(Src) 97.6 F (36.4 C) (Oral)  Resp 20  Ht 5\' 2"  (1.575 m)  Wt 82.101 kg (181 lb)  BMI 33.10 kg/m2  SpO2 92%  Physical Exam: General: Alert and awake oriented x3 not in any acute distress. CVS: S1-S2 clear no murmur rubs or gallops Chest: fine rales at bases Abdomen: soft nontender, nondistended, normal bowel sounds Extremities: no cyanosis, clubbing or edema noted bilaterally Neuro: Cranial nerves II-XII intact, no focal neurological deficits   The results of significant diagnostics from this hospitalization (including imaging, microbiology, ancillary and laboratory) are listed below for reference.    LAB RESULTS: Basic Metabolic  Panel:  Recent Labs Lab 04/04/13 0542 04/05/13 0614  NA 141 138  K 3.6* 4.4  CL 99 101  CO2 31 29  GLUCOSE 91 86  BUN 22 18  CREATININE 1.10 0.96  CALCIUM 9.3 9.3   Liver Function Tests:  Recent Labs Lab 03/31/13 1550  AST 29  ALT 22  ALKPHOS 111  BILITOT 0.7  PROT 8.1  ALBUMIN 3.7   No results found for this basename: LIPASE, AMYLASE,  in the last 168 hours No results found for this basename: AMMONIA,  in the last 168 hours CBC:  Recent Labs Lab 04/01/13 0637 04/02/13 0939  WBC 4.1 4.4  NEUTROABS 2.7  --   HGB 9.8* 10.4*  HCT 30.8* 32.9*  MCV 91.9 91.6  PLT 195 195   Cardiac Enzymes:  Recent Labs Lab 03/31/13 Harrell <0.30  BNP: No components found with this basename: POCBNP,  CBG: No results found for this basename: GLUCAP,  in the last 168 hours  Significant Diagnostic Studies:  Dg Chest 2 View  03/31/2013   CLINICAL DATA:  Increased shortness of breath.  Recent pneumonia.  EXAM: CHEST  2 VIEW  COMPARISON:  None.  FINDINGS: The heart is mildly enlarged. Bilateral lower lobe interstitial and airspace disease is present, left greater than right. Effusions are evident. Atherosclerotic calcifications are present at the aortic arch. Thoracic kyphosis is exaggerated.  IMPRESSION: 1. Bilateral interstitial and airspace disease, left greater than right. This is highly concerning for pneumonia. 2. Bilateral pleural effusions are also worse on the left. 3. Exaggerated thoracic kyphosis. 4. Mild cardiac enlargement.   Electronically Signed   By: Lawrence Santiago M.D.   On: 03/31/2013 15:25   Ct Angio Chest Pe W/cm &/or Wo Cm  03/31/2013   CLINICAL DATA:  Short of breath  EXAM: CT ANGIOGRAPHY CHEST WITH CONTRAST  TECHNIQUE: Multidetector CT imaging of the chest was performed using the standard protocol during bolus administration of intravenous contrast. Multiplanar CT image reconstructions including MIPs were obtained to evaluate the vascular anatomy.   CONTRAST:  162mL OMNIPAQUE IOHEXOL 350 MG/ML SOLN  COMPARISON:  None.  FINDINGS: No filling defects in the pulmonary arterial tree to suggest acute pulmonary thromboembolism.  Mild atherosclerotic changes of the aortic arch. No evidence of dissection or aneurysm.  Abnormal mediastinal adenopathy is present. Precarinal nodal mass measures 2.0 x 3.3 cm. 11 mm AP window node. 11 mm right hilar node. 10 mm left hilar node. Several small nodes between the innominate artery and right innominate vein are noted.  Bilateral small pleural effusions are present. There is anterior extension suggesting an element of loculation.  There is wall thickening and/or edema of the entire length of the esophagus.  No pneumothorax.  There are diffuse pulmonary parenchymal changes with a predilection for the lung bases characterize by interlobular septal thickening and heterogeneous opacities. No nodular lung disease. No cystic changes. Some areas of ground-glass are visualized.  Benign appearing liver cysts.  Kyphosis of the thoracic spine.  Review of the MIP images confirms the above findings.  IMPRESSION: No evidence of acute pulmonary thromboembolism.  Bilateral pleural effusions with evidence of loculation.  Bilateral interstitial lung disease as described. This may represent chronic disease or acute inflammatory process.  Abnormal mediastinal adenopathy. Differential diagnosis includes volume overload, inflammatory disease, or malignancy.   Electronically Signed   By: Maryclare Bean M.D.   On: 03/31/2013 18:27    2D ECHO:  [place], [place] [address] [id number]  ------------------------------------------------------------ Transthoracic Echocardiography  Patient: Sherleen, Panciera MR #: [id number] Study Date: 04/01/2013 Gender: F Age: 86 Height: 157.5cm Weight: 82.1kg BSA: 1.59m^2 Pt. Status: Room: A326  ATTENDING Fredia Sorrow Patrecia Pour, Vijaya Joan Mayans,  Rachal REFERRING Derrill Kay SONOGRAPHER Janalee Dane PERFORMING [facility], [place] Na cc:  ------------------------------------------------------------ LV EF: 60% - 65%  ------------------------------------------------------------ History: PMH: Dyspnea. Atrial fibrillation. Risk factors: Hypertension.  ------------------------------------------------------------ Study Conclusions  - Left ventricle: The cavity size was normal. Systolic function was normal. The estimated ejection fraction was in the range of 60% to 65%. Wall motion was normal; there were no regional wall motion abnormalities. Features are consistent with a pseudonormal left ventricular filling pattern, with concomitant abnormal relaxation and increased filling pressure (grade 2 diastolic dysfunction). - Mitral valve: Moderate regurgitation. - Left atrium: The atrium was moderately dilated. - Right  ventricle: The cavity size was mildly dilated. Wall thickness was normal. - Right atrium: The atrium was mildly dilated. - Pulmonary arteries: Systolic pressure was moderately increased. PA peak pressure: 24mm Hg (S).   Disposition and Follow-up:     Discharge Orders   Future Appointments Provider Department Dept Phone   05/29/2013 10:20 AM Satira Sark, MD [place] [phone]   Future Orders Complete By Expires   Diet - low sodium heart healthy  As directed    Increase activity slowly  As directed        DISPOSITION: Home DIET: Heart healthy  DISCHARGE FOLLOW-UP Follow-up Information   Please follow up.   Contact information:   Please follow with your appointment with pulmonology at [facility] on Feb16 th and cardiologist on March 3rd.      Follow up with Rozann Lesches, MD On 05/29/2013. (at 10:20AM)    Specialty:  Cardiology   Contact information:   [address] [place] [address] [phone]       Time spent on Discharge: 38 mins  Signed:   RAI,RIPUDEEP  M.D. [facility] 04/05/2013, 11:44 AM Pager: [phone]

## 2013-04-05 NOTE — Progress Notes (Signed)
Ambulated in hall with pulse ox on. Sats running around 89 to 91% without oxygen. Tolerated well. Became tired and went back to room. States 3rd time she walked today.

## 2013-04-05 NOTE — Progress Notes (Signed)
Physical Therapy Discharge Patient Details Name: Diane Cohen MRN: [id number] DOB: 12/30/1945 Today's Date: 04/05/2013 Time:  -      Patient discharged from PT services secondary to goals met and no further PT needs identified.  Please see latest therapy progress note for current level of functioning and progress toward goals.    Progress and discharge plan discussed with patient and/or caregiver: Patient/Caregiver agrees with plan  No treatment given pt walking in hallway I when therapist went to see pt. GP     RUSSELL,CINDY 04/05/2013, 9:36 AM

## 2013-04-05 NOTE — Progress Notes (Signed)
Subjective: She says she feels much better and wants to go home. She's been ambulating in the hall. She has no other new complaints. Her chest x-ray looked better and she's had significant diuresis. She gives further history that she has had TIAs in the past that she had forgotten to mention. She also says that she occasionally has an episode where she feels like things get dark. She's only had a few of these and she has discussed them with both her primary care physician and her primary cardiologist.  Objective: Vital signs in last 24 hours: Temp:  [97.4 F (36.3 C)-98.1 F (36.7 C)] 97.6 F (36.4 C) (01/28 0511) Pulse Rate:  [63-72] 64 (01/28 0511) Resp:  [20] 20 (01/28 0511) BP: (115-132)/(46-60) 130/60 mmHg (01/28 0511) SpO2:  [92 %-97 %] 92 % (01/28 0511) Weight change:  Last BM Date: 04/04/13  Intake/Output from previous day: 01/27 0701 - 01/28 0700 In: 590 [P.O.:340; IV Piggyback:250] Out: 750 [Urine:750]  PHYSICAL EXAM Her physical exam shows a she's awake and alert. She looks comfortable. I can't tell by exam a she's in atrial fibrillation or not. Her chest is much clearer with only very fine rales in the bases that are much less than previously. Her abdomen is soft. She does not have any edema and her neurological examination is grossly intact  Lab Results:    Basic Metabolic Panel:  Recent Labs  04/04/13 0542 04/05/13 0614  NA 141 138  K 3.6* 4.4  CL 99 101  CO2 31 29  GLUCOSE 91 86  BUN 22 18  CREATININE 1.10 0.96  CALCIUM 9.3 9.3   Liver Function Tests: No results found for this basename: AST, ALT, ALKPHOS, BILITOT, PROT, ALBUMIN,  in the last 72 hours No results found for this basename: LIPASE, AMYLASE,  in the last 72 hours No results found for this basename: AMMONIA,  in the last 72 hours CBC:  Recent Labs  04/02/13 0939  WBC 4.4  HGB 10.4*  HCT 32.9*  MCV 91.6  PLT 195   Cardiac Enzymes: No results found for this basename: CKTOTAL, CKMB,  CKMBINDEX, TROPONINI,  in the last 72 hours BNP: No results found for this basename: PROBNP,  in the last 72 hours D-Dimer: No results found for this basename: DDIMER,  in the last 72 hours CBG: No results found for this basename: GLUCAP,  in the last 72 hours Hemoglobin A1C: No results found for this basename: HGBA1C,  in the last 72 hours Fasting Lipid Panel: No results found for this basename: CHOL, HDL, LDLCALC, TRIG, CHOLHDL, LDLDIRECT,  in the last 72 hours Thyroid Function Tests: No results found for this basename: TSH, T4TOTAL, FREET4, T3FREE, THYROIDAB,  in the last 72 hours Anemia Panel: No results found for this basename: VITAMINB12, FOLATE, FERRITIN, TIBC, IRON, RETICCTPCT,  in the last 72 hours Coagulation: No results found for this basename: LABPROT, INR,  in the last 72 hours Urine Drug Screen: Drugs of Abuse  No results found for this basename: labopia, cocainscrnur, labbenz, amphetmu, thcu, labbarb    Alcohol Level: No results found for this basename: ETH,  in the last 72 hours Urinalysis: No results found for this basename: COLORURINE, APPERANCEUR, LABSPEC, PHURINE, GLUCOSEU, HGBUR, BILIRUBINUR, KETONESUR, PROTEINUR, UROBILINOGEN, NITRITE, LEUKOCYTESUR,  in the last 72 hours Misc. Labs:  ABGS No results found for this basename: PHART, PCO2, PO2ART, TCO2, HCO3,  in the last 72 hours CULTURES Recent Results (from the past 240 hour(s))  CULTURE, BLOOD (ROUTINE X  2)     Status: None   Collection Time    03/31/13  7:18 PM      Result Value Range Status   Specimen Description BLOOD RIGHT HAND   Final   Special Requests BOTTLES DRAWN AEROBIC ONLY [place]   Final   Culture NO GROWTH 5 DAYS   Final   Report Status 04/05/2013 FINAL   Final  CULTURE, BLOOD (ROUTINE X 2)     Status: None   Collection Time    03/31/13  7:24 PM      Result Value Range Status   Specimen Description BLOOD LEFT ANTECUBITAL   Final   Special Requests BOTTLES DRAWN AEROBIC AND ANAEROBIC 8CC    Final   Culture NO GROWTH 5 DAYS   Final   Report Status 04/05/2013 FINAL   Final   Studies/Results: Dg Chest 2 View  04/04/2013   CLINICAL DATA:  Pneumonia, history hypertension  EXAM: CHEST  2 VIEW  COMPARISON:  03/31/2012  FINDINGS: Upper normal heart size.  Atherosclerotic calcification aorta.  Mediastinal contours normal.  Slight pulmonary vascular congestion.  Patchy infiltrates in the mid to lower lungs bilaterally improved since previous exam.  Underlying emphysematous changes.  No gross pleural effusion or pneumothorax.  Bones demineralized with accentuated thoracic kyphosis noted.  IMPRESSION: Improved pulmonary infiltrates bilaterally.   Electronically Signed   By: Lavonia Dana M.D.   On: 04/04/2013 09:09    Medications:  Prior to Admission:  Prescriptions prior to admission  Medication Sig Dispense Refill  . acetaminophen (TYLENOL) 500 MG tablet Take 1,000 mg by mouth every 6 (six) hours as needed.      Marland Kitchen albuterol (PROVENTIL HFA;VENTOLIN HFA) 108 (90 BASE) MCG/ACT inhaler Inhale 1-2 puffs into the lungs every 6 (six) hours as needed for wheezing or shortness of breath.      Marland Kitchen amiodarone (PACERONE) 200 MG tablet Take 200 mg by mouth daily.      Marland Kitchen amLODipine (NORVASC) 5 MG tablet Take 5 mg by mouth daily.      Marland Kitchen aspirin EC 81 MG tablet Take 81 mg by mouth daily.      Marland Kitchen escitalopram (LEXAPRO) 20 MG tablet Take 20 mg by mouth at bedtime.      Marland Kitchen estradiol (ESTRACE) 2 MG tablet Take 2 mg by mouth at bedtime.      . furosemide (LASIX) 40 MG tablet Take 40 mg by mouth daily.      Marland Kitchen levothyroxine (SYNTHROID, LEVOTHROID) 100 MCG tablet Take 100 mcg by mouth daily before breakfast.      . lisinopril (PRINIVIL,ZESTRIL) 20 MG tablet Take 20 mg by mouth 2 (two) times daily.      . magnesium oxide (MAG-OX) 400 MG tablet Take 400 mg by mouth daily.      . meloxicam (MOBIC) 15 MG tablet Take 15 mg by mouth at bedtime.      . metoprolol succinate (TOPROL-XL) 100 MG 24 hr tablet Take 100 mg by  mouth daily. Take with or immediately following a meal.      . omeprazole (PRILOSEC) 40 MG capsule Take 40 mg by mouth daily.      . rizatriptan (MAXALT-MLT) 10 MG disintegrating tablet Take 10 mg by mouth as needed for migraine. May repeat in 2 hours if needed       Scheduled: . amiodarone  200 mg Oral Daily  . amLODipine  5 mg Oral Daily  . aspirin EC  81 mg Oral Daily  . ceFEPime (  MAXIPIME) IV  1 g Intravenous Q8H  . enoxaparin (LOVENOX) injection  40 mg Subcutaneous Q24H  . escitalopram  20 mg Oral QHS  . estradiol  2 mg Oral QHS  . furosemide  40 mg Oral Daily  . levothyroxine  100 mcg Oral QAC breakfast  . lisinopril  20 mg Oral Daily  . meloxicam  15 mg Oral QHS  . metoprolol succinate  50 mg Oral Daily  . pantoprazole  40 mg Oral Daily  . sodium chloride  3 mL Intravenous Q12H  . vancomycin  750 mg Intravenous Q12H   Continuous:  YTK:PTWSFK chloride, albuterol, sodium chloride  Assesment: She was admitted with acute hypoxic respiratory failure. I think this is multifactorial and she probably has some element of atypical pneumonia but I think also had diastolic CHF. She has chronic atrial fibrillation. Her chest x-ray has improved markedly. She has given more past medical history now of TIA Principal Problem:   Acute respiratory failure with hypoxia Active Problems:   Hypertension   Thyroid disease   Atrial fibrillation   SOB (shortness of breath)   Mediastinal adenopathy   Pleural effusion, bilateral   Hypoxia   Kyphosis deformity of spine    Plan: From a strictly pulmonary point of view I think she's okay to be discharged. I would like her to be on doxycycline for 3 weeks because that's when she has an appointment set up with her primary pulmonary physician. She should also be discharged on one week of Augmentin 875 mg twice a day if it's felt that she's okay for discharge overall    LOS: 5 days   Rajesh Wyss L 04/05/2013, 8:26 AM

## 2013-04-06 NOTE — Progress Notes (Signed)
Discharge instructions and prescriptions given, verbalized understanding, out in stable condition ambulatory with staff. 

## 2013-05-29 ENCOUNTER — Ambulatory Visit: Payer: Self-pay | Admitting: Cardiology

## 2013-07-10 ENCOUNTER — Ambulatory Visit: Payer: Self-pay | Admitting: Cardiology

## 2013-07-17 ENCOUNTER — Encounter (INDEPENDENT_AMBULATORY_CARE_PROVIDER_SITE_OTHER): Payer: Self-pay

## 2013-07-17 ENCOUNTER — Ambulatory Visit (INDEPENDENT_AMBULATORY_CARE_PROVIDER_SITE_OTHER): Payer: Medicare PPO | Admitting: Cardiology

## 2013-07-17 ENCOUNTER — Encounter: Payer: Self-pay | Admitting: Cardiology

## 2013-07-17 VITALS — BP 118/54 | HR 60 | Ht 62.0 in | Wt 187.0 lb

## 2013-07-17 DIAGNOSIS — Z79899 Other long term (current) drug therapy: Secondary | ICD-10-CM

## 2013-07-17 DIAGNOSIS — I4891 Unspecified atrial fibrillation: Secondary | ICD-10-CM

## 2013-07-17 DIAGNOSIS — I10 Essential (primary) hypertension: Secondary | ICD-10-CM

## 2013-07-17 MED ORDER — AMIODARONE HCL 200 MG PO TABS: 100.0000 mg | ORAL_TABLET | Freq: Every day | ORAL | Status: DC

## 2013-07-17 NOTE — Assessment & Plan Note (Signed)
Paroxysmal based on available information and diagnosed at least 5 years ago. She has been on amiodarone long-term, previously Pradaxa which was stopped due to cost (no reported bleeding problems). She is on aspirin at this point. Her CHADSVASC score is 5 and I have asked her to reconsider going back on anticoagulation. We will try and coordinate this through the anticoagulation clinic, there are 3 other NOACs that she could consider instead of aspirin. She wanted to look the cost of these treatments first before making a decision. I will otherwise reduce her amiodarone to 100 mg daily since she has had no obvious recurrences, and also to reduce the chance for side effects. I am concerned that she has had pulmonary issues over the last year. PFTs will be obtained. Keep follow with Dr. Luan Pulling as well.

## 2013-07-17 NOTE — Patient Instructions (Signed)
Your physician recommends that you schedule a follow-up appointment in: 2 months    Your physician has recommended you make the following change in your medication:   DECREASE Amiodarone to 100 mg daily   Your physician has recommended that you have a pulmonary function test. Pulmonary Function Tests are a group of tests that measure how well air moves in and out of your lungs.

## 2013-07-17 NOTE — Progress Notes (Signed)
Clinical Summary Diane Cohen is a 68 y.o.female presenting to establish cardiology followup. She recently relocated here from [place] to be near family. She will be establishing with Dr. Luan Pulling next week for primary care and pulmonary followup. Records reviewed finding hospitalization back in January of this year with hypoxic respiratory failure associated with pneumonia and pleural effusions.  Outside records from [place] indicate history of paroxysmal atrial fibrillation, previously on Pradaxa. She has been on amiodarone for rhythm control. A Lexiscan myocardial perfusion study from June 2009 was negative for ischemia with LVEF 80%. She tells me that she was taken off Pradaxa due to the cost and has been on aspirin since then, at least since last year. She reported no bleeding problems on anticoagulation. Her CHADSVASc score is 5.  Lab work from January showed TSH 7.7, normal LFTs. Not certain if she has had a pulmonary function testing. She has been on amiodarone for at least 5 years.  Echocardiogram from January of this year revealed LVEF 60-65% with grade 2 diastolic dysfunction, moderate mitral regurgitation, moderate left atrial enlargement, mildly dilated right ventricle, mild right atrial enlargement, PASP 50 mm mercury.   Reports generally improved breathing status, no fevers or chills, no cough. She has had leg edema recently.  ECG today shows sinus bradycardia with normal intervals.   Allergies  Allergen Reactions  . Levaquin [Levofloxacin] Other (See Comments)    Headache   . Sulfa Antibiotics Hives    Current Outpatient Prescriptions  Medication Sig Dispense Refill  . acetaminophen (TYLENOL) 500 MG tablet Take 1,000 mg by mouth every 6 (six) hours as needed.      Marland Kitchen albuterol (PROVENTIL HFA;VENTOLIN HFA) 108 (90 BASE) MCG/ACT inhaler Inhale 1-2 puffs into the lungs every 6 (six) hours as needed for wheezing or shortness of breath.      Marland Kitchen amiodarone (PACERONE) 200 MG tablet  Take 0.5 tablets (100 mg total) by mouth daily.  45 tablet  3  . amLODipine (NORVASC) 5 MG tablet Take 5 mg by mouth daily.      Marland Kitchen amoxicillin-clavulanate (AUGMENTIN) 875-125 MG per tablet Take 1 tablet by mouth 2 (two) times daily. X 1 week  14 tablet  0  . aspirin EC 81 MG tablet Take 81 mg by mouth daily.      Marland Kitchen escitalopram (LEXAPRO) 20 MG tablet Take 20 mg by mouth at bedtime.      Marland Kitchen estradiol (ESTRACE) 2 MG tablet Take 2 mg by mouth at bedtime.      . furosemide (LASIX) 40 MG tablet Take 40 mg by mouth daily.      Marland Kitchen levothyroxine (SYNTHROID, LEVOTHROID) 100 MCG tablet Take 100 mcg by mouth daily before breakfast.      . lisinopril (PRINIVIL,ZESTRIL) 20 MG tablet Take 1 tablet (20 mg total) by mouth daily.      . magnesium oxide (MAG-OX) 400 MG tablet Take 400 mg by mouth daily.      . meloxicam (MOBIC) 15 MG tablet Take 15 mg by mouth at bedtime.      . metoprolol succinate (TOPROL-XL) 100 MG 24 hr tablet Take 100 mg by mouth daily. Take with or immediately following a meal.      . omeprazole (PRILOSEC) 40 MG capsule Take 40 mg by mouth daily.      . rizatriptan (MAXALT-MLT) 10 MG disintegrating tablet Take 10 mg by mouth as needed for migraine. May repeat in 2 hours if needed       No  current facility-administered medications for this visit.    Past Medical History  Diagnosis Date  . Atrial fibrillation   . Hypertension   . Hypothyroidism   . Pneumonia   . TIA (transient ischemic attack)     Past Surgical History  Procedure Laterality Date  . Abdominal hysterectomy    . Breast surgery    . Cholecystectomy      History reviewed. No pertinent family history.  Social History Diane Cohen reports that she has never smoked. She does not have any smokeless tobacco history on file. Diane Cohen reports that she does not drink alcohol.  Review of Systems Head no angina, no palpitations, no syncope. No melena or hematochezia. Otherwise negative.  Physical Examination Filed  Vitals:   07/17/13 1342  BP: 118/54  Pulse: 60   Filed Weights   07/17/13 1342  Weight: 187 lb (84.823 kg)   Overweight woman, appears comfortable. HEENT: Conjunctiva and lids normal, oropharynx clear. Neck: Supple, no elevated JVP or carotid bruits, no thyromegaly. Lungs: Course but clear to auscultation, nonlabored breathing at rest. Cardiac: Regular rate and rhythm, 2/6 systolic murmur at the base, no pericardial rub. Abdomen: Soft, nontender, bowel sounds present, no guarding or rebound. Extremities: 1-2+ edema below the knees, distal pulses 2+. Skin: Warm and dry. Musculoskeletal: No kyphosis. Neuropsychiatric: Alert and oriented x3, affect grossly appropriate.   Problem List and Plan   Atrial fibrillation Paroxysmal based on available information and diagnosed at least 5 years ago. She has been on amiodarone long-term, previously Pradaxa which was stopped due to cost (no reported bleeding problems). She is on aspirin at this point. Her CHADSVASC score is 5 and I have asked her to reconsider going back on anticoagulation. We will try and coordinate this through the anticoagulation clinic, there are 3 other NOACs that she could consider instead of aspirin. She wanted to look the cost of these treatments first before making a decision. I will otherwise reduce her amiodarone to 100 mg daily since she has had no obvious recurrences, and also to reduce the chance for side effects. I am concerned that she has had pulmonary issues over the last year. PFTs will be obtained. Keep follow with Dr. Luan Pulling as well.  Hypertension Blood pressure is normal today.    Satira Sark, M.D., F.A.C.C.

## 2013-07-17 NOTE — Assessment & Plan Note (Signed)
Blood pressure is normal today. 

## 2013-07-26 ENCOUNTER — Ambulatory Visit (HOSPITAL_COMMUNITY)
Admission: RE | Admit: 2013-07-26 | Discharge: 2013-07-26 | Disposition: A | Discharge status: Home / Self Care | Payer: Medicare PPO | Source: Ambulatory Visit | Attending: Pulmonary Disease | Admitting: Pulmonary Disease

## 2013-07-26 ENCOUNTER — Ambulatory Visit (HOSPITAL_COMMUNITY)
Admission: RE | Admit: 2013-07-26 | Discharge: 2013-07-26 | Disposition: A | Discharge status: Home / Self Care | Payer: Medicare PPO | Source: Ambulatory Visit | Attending: Cardiology | Admitting: Cardiology

## 2013-07-26 ENCOUNTER — Other Ambulatory Visit (HOSPITAL_COMMUNITY): Payer: Self-pay | Admitting: Pulmonary Disease

## 2013-07-26 ENCOUNTER — Other Ambulatory Visit: Payer: Self-pay | Admitting: *Deleted

## 2013-07-26 ENCOUNTER — Telehealth: Payer: Self-pay | Admitting: *Deleted

## 2013-07-26 DIAGNOSIS — J811 Chronic pulmonary edema: Secondary | ICD-10-CM | POA: Insufficient documentation

## 2013-07-26 DIAGNOSIS — I4891 Unspecified atrial fibrillation: Secondary | ICD-10-CM

## 2013-07-26 DIAGNOSIS — J189 Pneumonia, unspecified organism: Secondary | ICD-10-CM

## 2013-07-26 DIAGNOSIS — J4489 Other specified chronic obstructive pulmonary disease: Secondary | ICD-10-CM | POA: Insufficient documentation

## 2013-07-26 DIAGNOSIS — Z79899 Other long term (current) drug therapy: Secondary | ICD-10-CM | POA: Insufficient documentation

## 2013-07-26 DIAGNOSIS — I700 Atherosclerosis of aorta: Secondary | ICD-10-CM | POA: Insufficient documentation

## 2013-07-26 DIAGNOSIS — M4000 Postural kyphosis, site unspecified: Secondary | ICD-10-CM | POA: Insufficient documentation

## 2013-07-26 DIAGNOSIS — J449 Chronic obstructive pulmonary disease, unspecified: Secondary | ICD-10-CM | POA: Insufficient documentation

## 2013-07-26 MED ORDER — ALBUTEROL SULFATE (2.5 MG/3ML) 0.083% IN NEBU
2.5000 mg | INHALATION_SOLUTION | Freq: Once | RESPIRATORY_TRACT | Status: AC
  Administered 2013-07-26: 2.5 mg via RESPIRATORY_TRACT

## 2013-07-26 NOTE — Telephone Encounter (Signed)
Pt has decided she wants to start Xarelto.  Have entered lab orders for BMP and CBC to check CrCl prior to starting Xarelto.  She will go to [place] on 5/21.  I will review labs 5/22 and call pt with results.  Will sent in Rx at that time.

## 2013-07-26 NOTE — Telephone Encounter (Signed)
Patient states she has questions about information you gave her on blood thinners at her office visit last week. Please return call. / tgs

## 2013-07-27 ENCOUNTER — Other Ambulatory Visit: Payer: Self-pay | Admitting: *Deleted

## 2013-07-27 DIAGNOSIS — I4891 Unspecified atrial fibrillation: Secondary | ICD-10-CM

## 2013-07-27 LAB — CBC
HCT: 33.4 % — ABNORMAL LOW (ref 36.0–46.0)
HEMOGLOBIN: 10.9 g/dL — AB (ref 12.0–15.0)
MCH: 28.3 pg (ref 26.0–34.0)
MCHC: 32.6 g/dL (ref 30.0–36.0)
MCV: 86.8 fL (ref 78.0–100.0)
PLATELETS: 251 10*3/uL (ref 150–400)
RBC: 3.85 MIL/uL — ABNORMAL LOW (ref 3.87–5.11)
RDW: 16 % — AB (ref 11.5–15.5)
WBC: 3.7 10*3/uL — ABNORMAL LOW (ref 4.0–10.5)

## 2013-07-27 LAB — PROTIME-INR
INR: 0.95 (ref <1.50)
PROTHROMBIN TIME: 12.6 s (ref 11.6–15.2)

## 2013-07-28 ENCOUNTER — Telehealth: Payer: Self-pay | Admitting: *Deleted

## 2013-07-28 MED ORDER — RIVAROXABAN 20 MG PO TABS: 20.0000 mg | ORAL_TABLET | Freq: Every day | ORAL | Status: DC

## 2013-07-28 NOTE — Telephone Encounter (Signed)
Called pt with labs results.  5/21  Hgb 10.9/Hct 33.4  Stable  1/15 BUN 18  SrCr 0.96  Cr Cl 76.15  Wt 187   Pt started on Xarelto 20mg  daily with evening Meal.  Rx sent to [facility].  Made f/u appt for 1 month with repeat CBC/BMP at that time.  Pt was started on Xarelto for atrial fib on 07/28/13.    Reviewed patients medication list.  Pt is not currently on any combined P-gp and strong CYP3A4 inhibitors/inducers (ketoconazole, traconazole, ritonavir, carbamazepine, phenytoin, rifampin, St. John's wort).  Reviewed labs.  SCr 0.96, Weight 187, CrCl- 76.15.  Dose is appropriate based on CrCl.   Hgb and HCT: 10.9/33.4 stable  A full discussion of the nature of anticoagulants has been carried out.  A benefit/risk analysis has been presented to the patient, so that they understand the justification for choosing anticoagulation with Xarelto at this time.  The need for compliance is stressed.  Pt is aware to take the medication once daily with the largest meal of the day.  Side effects of potential bleeding are discussed, including unusual colored urine or stools, coughing up blood or coffee ground emesis, nose bleeds or serious fall or head trauma.  Discussed signs and symptoms of stroke. The patient should avoid any OTC items containing aspirin or ibuprofen.  Avoid alcohol consumption.   Call if any signs of abnormal bleeding.  Discussed financial obligations and resolved any difficulty in obtaining medication.  Next lab test test in 1 months.

## 2013-08-08 ENCOUNTER — Telehealth: Payer: Self-pay | Admitting: Cardiology

## 2013-08-08 DIAGNOSIS — I4891 Unspecified atrial fibrillation: Secondary | ICD-10-CM

## 2013-08-08 LAB — PULMONARY FUNCTION TEST
DL/VA % PRED: 97 %
DL/VA: 4.29 ml/min/mmHg/L
DLCO COR % PRED: 58 %
DLCO UNC % PRED: 58 %
DLCO cor: 11.82 ml/min/mmHg
DLCO unc: 11.82 ml/min/mmHg
FEF 25-75 POST: 1.39 L/s
FEF 25-75 Pre: 1.31 L/sec
FEF2575-%Change-Post: 5 %
FEF2575-%PRED-POST: 75 %
FEF2575-%PRED-PRE: 71 %
FEV1-%Change-Post: 3 %
FEV1-%Pred-Post: 64 %
FEV1-%Pred-Pre: 62 %
FEV1-POST: 1.33 L
FEV1-Pre: 1.28 L
FEV1FVC-%CHANGE-POST: 7 %
FEV1FVC-%Pred-Pre: 106 %
FEV6-%CHANGE-POST: -2 %
FEV6-%PRED-POST: 58 %
FEV6-%PRED-PRE: 60 %
FEV6-Post: 1.52 L
FEV6-Pre: 1.57 L
FEV6FVC-%PRED-POST: 104 %
FEV6FVC-%Pred-Pre: 104 %
FVC-%Change-Post: -3 %
FVC-%Pred-Post: 56 %
FVC-%Pred-Pre: 58 %
FVC-Post: 1.52 L
FVC-Pre: 1.58 L
Post FEV1/FVC ratio: 87 %
Post FEV6/FVC ratio: 100 %
Pre FEV1/FVC ratio: 81 %
Pre FEV6/FVC Ratio: 100 %
RV % pred: 92 %
RV: 1.85 L
TLC % pred: 75 %
TLC: 3.5 L

## 2013-08-08 MED ORDER — AMIODARONE HCL 200 MG PO TABS: 100.0000 mg | ORAL_TABLET | Freq: Every day | ORAL | Status: DC

## 2013-08-08 NOTE — Telephone Encounter (Signed)
Refilled request complete

## 2013-08-08 NOTE — Telephone Encounter (Signed)
Patient needs RX for Amiodarone sent to [facility] in [place] / tgs

## 2013-08-28 ENCOUNTER — Telehealth: Payer: Self-pay | Admitting: Cardiology

## 2013-08-28 ENCOUNTER — Ambulatory Visit (INDEPENDENT_AMBULATORY_CARE_PROVIDER_SITE_OTHER): Payer: Medicare PPO | Admitting: *Deleted

## 2013-08-28 ENCOUNTER — Other Ambulatory Visit: Payer: Self-pay | Admitting: Cardiology

## 2013-08-28 DIAGNOSIS — I4891 Unspecified atrial fibrillation: Secondary | ICD-10-CM

## 2013-08-28 LAB — CBC
HCT: 31.3 % — ABNORMAL LOW (ref 36.0–46.0)
HEMOGLOBIN: 10.1 g/dL — AB (ref 12.0–15.0)
MCH: 27.7 pg (ref 26.0–34.0)
MCHC: 32.3 g/dL (ref 30.0–36.0)
MCV: 86 fL (ref 78.0–100.0)
PLATELETS: 261 10*3/uL (ref 150–400)
RBC: 3.64 MIL/uL — ABNORMAL LOW (ref 3.87–5.11)
RDW: 16.6 % — AB (ref 11.5–15.5)
WBC: 4.5 10*3/uL (ref 4.0–10.5)

## 2013-08-28 LAB — BASIC METABOLIC PANEL
BUN: 17 mg/dL (ref 6–23)
CALCIUM: 9.5 mg/dL (ref 8.4–10.5)
CO2: 27 mEq/L (ref 19–32)
Chloride: 104 mEq/L (ref 96–112)
Creat: 0.95 mg/dL (ref 0.50–1.10)
Glucose, Bld: 91 mg/dL (ref 70–99)
POTASSIUM: 4.1 meq/L (ref 3.5–5.3)
SODIUM: 137 meq/L (ref 135–145)

## 2013-08-28 NOTE — Patient Instructions (Addendum)
Pt was started on Xarelto for atrial fib on 07/28/13.   Reviewed patients medication list. Pt is not currently on any combined P-gp and strong CYP3A4 inhibitors/inducers (ketoconazole, traconazole, ritonavir, carbamazepine, phenytoin, rifampin, St. John's wort). Reviewed labs from 08/28/13. SCr 0.95, Weight 185.8, CrCl- 76.45. Dose is appropriate based on CrCl. Hgb and HCT: 10.1/31.3  And stable.  Was started on iron supplement per Jory Sims NP and referred to PCP for follow-up.   A full discussion of the nature of anticoagulants has been carried out. A benefit/risk analysis has been presented to the patient, so that they understand the justification for choosing anticoagulation with Xarelto at this time. The need for compliance is stressed. Pt is aware to take the medication once daily with the largest meal of the day. Side effects of potential bleeding are discussed, including unusual colored urine or stools, coughing up blood or coffee ground emesis, nose bleeds or serious fall or head trauma. Discussed signs and symptoms of stroke. The patient should avoid any OTC items containing aspirin or ibuprofen. Avoid alcohol consumption. Call if any signs of abnormal bleeding. Discussed financial obligations and resolved any difficulty in obtaining medication. Next lab test test in 6 months.

## 2013-08-28 NOTE — Telephone Encounter (Signed)
Aaron Edelman pharmacist at [facility] pts initial script for Xarelto was written correctly by Dr.McDowell for 20 mg in May,however, the pharmacy filled rx for only 15 mg.They filled rx correctly today for 20 mg but wanted [place] to be aware of situation.Edrick Oh RN made aware.

## 2013-08-28 NOTE — Telephone Encounter (Signed)
Please call Aaron Edelman at [facility] regarding patient's dosage of Xarelto / tgs

## 2013-08-29 ENCOUNTER — Ambulatory Visit (INDEPENDENT_AMBULATORY_CARE_PROVIDER_SITE_OTHER): Payer: Medicare PPO | Admitting: Adult Health

## 2013-08-29 ENCOUNTER — Encounter: Payer: Self-pay | Admitting: Adult Health

## 2013-08-29 VITALS — BP 138/78 | HR 63 | Ht 61.0 in | Wt 185.0 lb

## 2013-08-29 DIAGNOSIS — R600 Localized edema: Secondary | ICD-10-CM

## 2013-08-29 DIAGNOSIS — I4891 Unspecified atrial fibrillation: Secondary | ICD-10-CM

## 2013-08-29 DIAGNOSIS — I480 Paroxysmal atrial fibrillation: Secondary | ICD-10-CM

## 2013-08-29 DIAGNOSIS — R0602 Shortness of breath: Secondary | ICD-10-CM

## 2013-08-29 DIAGNOSIS — R609 Edema, unspecified: Secondary | ICD-10-CM

## 2013-08-29 MED ORDER — FERROUS SULFATE 325 (65 FE) MG PO TABS: 325.0000 mg | ORAL_TABLET | Freq: Every day | ORAL | Status: DC

## 2013-08-29 MED ORDER — DOCUSATE SODIUM 100 MG PO CAPS: 100.0000 mg | ORAL_CAPSULE | Freq: Every day | ORAL | Status: DC

## 2013-08-29 NOTE — Progress Notes (Deleted)
Name: Diane Cohen    DOB: 1945/07/24  Age: 68 y.o.  MR#: [id number]       PCP:  Alonza Bogus, MD      Insurance: Payor: [employer] MEDICARE / Plan: [employer] MEDICARE CHOICE PPO / Product Type: *No Product type* /   CC:    Chief Complaint  Patient presents with  . Atrial Fibrillation  . Hypertension    VS Filed Vitals:   08/29/13 1348  BP: 138/78  Pulse: 63  Height: 5\' 1"  (1.549 m)  Weight: 185 lb (83.915 kg)    Weights Current Weight  08/29/13 185 lb (83.915 kg)  07/17/13 187 lb (84.823 kg)  03/31/13 181 lb (82.101 kg)    Blood Pressure  BP Readings from Last 3 Encounters:  08/29/13 138/78  07/17/13 118/54  04/05/13 130/60     Admit date:  (Not on file) Last encounter with RMR:  Visit date not found   Allergy Levaquin and Sulfa antibiotics  Current Outpatient Prescriptions  Medication Sig Dispense Refill  . acetaminophen (TYLENOL) 500 MG tablet Take 1,000 mg by mouth every 6 (six) hours as needed.      Marland Kitchen albuterol (PROVENTIL HFA;VENTOLIN HFA) 108 (90 BASE) MCG/ACT inhaler Inhale 1-2 puffs into the lungs every 6 (six) hours as needed for wheezing or shortness of breath.      Marland Kitchen amiodarone (PACERONE) 200 MG tablet Take 0.5 tablets (100 mg total) by mouth daily.  45 tablet  3  . amLODipine (NORVASC) 5 MG tablet Take 5 mg by mouth daily.      Marland Kitchen escitalopram (LEXAPRO) 20 MG tablet Take 20 mg by mouth at bedtime.      Marland Kitchen estradiol (ESTRACE) 2 MG tablet Take 2 mg by mouth at bedtime.      . furosemide (LASIX) 40 MG tablet Take 40 mg by mouth daily.      Marland Kitchen levothyroxine (SYNTHROID, LEVOTHROID) 100 MCG tablet Take 100 mcg by mouth daily before breakfast.      . lisinopril (PRINIVIL,ZESTRIL) 20 MG tablet Take 1 tablet (20 mg total) by mouth daily.      . magnesium oxide (MAG-OX) 400 MG tablet Take 400 mg by mouth daily.      . meloxicam (MOBIC) 15 MG tablet Take 15 mg by mouth at bedtime.      . metoprolol succinate (TOPROL-XL) 100 MG 24 hr tablet Take 100 mg by mouth daily. Take  with or immediately following a meal.      . omeprazole (PRILOSEC) 40 MG capsule Take 40 mg by mouth daily.      . rivaroxaban (XARELTO) 20 MG TABS tablet Take 1 tablet (20 mg total) by mouth daily with supper.  30 tablet  6  . rizatriptan (MAXALT-MLT) 10 MG disintegrating tablet Take 10 mg by mouth as needed for migraine. May repeat in 2 hours if needed       No current facility-administered medications for this visit.    Discontinued Meds:    Medications Discontinued During This Encounter  Medication Reason  . amoxicillin-clavulanate (AUGMENTIN) 875-125 MG per tablet Error  . aspirin EC 81 MG tablet Error    Patient Active Problem List   Diagnosis Date Noted  . SOB (shortness of breath) 03/31/2013  . Pleural effusion, bilateral 03/31/2013  . Kyphosis deformity of spine 03/31/2013  . Hypertension   . Atrial fibrillation     LABS    Component Value Date/Time   NA 137 08/28/2013 1209   NA 138 04/05/2013 7829  NA 141 04/04/2013 0542   K 4.1 08/28/2013 1209   K 4.4 04/05/2013 0614   K 3.6* 04/04/2013 0542   CL 104 08/28/2013 1209   CL 101 04/05/2013 0614   CL 99 04/04/2013 0542   CO2 27 08/28/2013 1209   CO2 29 04/05/2013 0614   CO2 31 04/04/2013 0542   GLUCOSE 91 08/28/2013 1209   GLUCOSE 86 04/05/2013 0614   GLUCOSE 91 04/04/2013 0542   BUN 17 08/28/2013 1209   BUN 18 04/05/2013 0614   BUN 22 04/04/2013 0542   CREATININE 0.95 08/28/2013 1209   CREATININE 0.96 04/05/2013 0614   CREATININE 1.10 04/04/2013 0542   CREATININE 1.12* 04/03/2013 0526   CALCIUM 9.5 08/28/2013 1209   CALCIUM 9.3 04/05/2013 0614   CALCIUM 9.3 04/04/2013 0542   GFRNONAA 60* 04/05/2013 0614   GFRNONAA 51* 04/04/2013 0542   GFRNONAA 50* 04/03/2013 0526   GFRAA 69* 04/05/2013 0614   GFRAA 59* 04/04/2013 0542   GFRAA 58* 04/03/2013 0526   CMP     Component Value Date/Time   NA 137 08/28/2013 1209   K 4.1 08/28/2013 1209   CL 104 08/28/2013 1209   CO2 27 08/28/2013 1209   GLUCOSE 91 08/28/2013 1209   BUN 17 08/28/2013  1209   CREATININE 0.95 08/28/2013 1209   CREATININE 0.96 04/05/2013 0614   CALCIUM 9.5 08/28/2013 1209   PROT 8.1 03/31/2013 1550   ALBUMIN 3.7 03/31/2013 1550   AST 29 03/31/2013 1550   ALT 22 03/31/2013 1550   ALKPHOS 111 03/31/2013 1550   BILITOT 0.7 03/31/2013 1550   GFRNONAA 60* 04/05/2013 0614   GFRAA 69* 04/05/2013 0614       Component Value Date/Time   WBC 4.5 08/28/2013 1209   WBC 3.7* 07/27/2013 1007   WBC 4.4 04/02/2013 0939   HGB 10.1* 08/28/2013 1209   HGB 10.9* 07/27/2013 1007   HGB 10.4* 04/02/2013 0939   HCT 31.3* 08/28/2013 1209   HCT 33.4* 07/27/2013 1007   HCT 32.9* 04/02/2013 0939   MCV 86.0 08/28/2013 1209   MCV 86.8 07/27/2013 1007   MCV 91.6 04/02/2013 0939    Lipid Panel  No results found for this basename: chol, trig, hdl, cholhdl, vldl, ldlcalc    ABG No results found for this basename: phart, pco2, pco2art, po2, po2art, hco3, tco2, acidbasedef, o2sat     Lab Results  Component Value Date   TSH 7.777* 03/31/2013   BNP (last 3 results)  Recent Labs  03/31/13 1550  PROBNP 1241.0*   Cardiac Panel (last 3 results) No results found for this basename: CKTOTAL, CKMB, TROPONINI, RELINDX,  in the last 72 hours  Iron/TIBC/Ferritin No results found for this basename: iron, tibc, ferritin     EKG Orders placed in visit on 07/17/13  . EKG 12-LEAD     Prior Assessment and Plan Problem List as of 08/29/2013     Cardiovascular and Mediastinum   Hypertension   Last Assessment & Plan   07/17/2013 Office Visit Written 07/17/2013  2:20 PM by Satira Sark, MD     Blood pressure is normal today.    Atrial fibrillation   Last Assessment & Plan   07/17/2013 Office Visit Written 07/17/2013  2:20 PM by Satira Sark, MD     Paroxysmal based on available information and diagnosed at least 5 years ago. She has been on amiodarone long-term, previously Pradaxa which was stopped due to cost (no reported bleeding problems). She is on aspirin at  this point. Her CHADSVASC  score is 5 and I have asked her to reconsider going back on anticoagulation. We will try and coordinate this through the anticoagulation clinic, there are 3 other NOACs that she could consider instead of aspirin. She wanted to look the cost of these treatments first before making a decision. I will otherwise reduce her amiodarone to 100 mg daily since she has had no obvious recurrences, and also to reduce the chance for side effects. I am concerned that she has had pulmonary issues over the last year. PFTs will be obtained. Keep follow with Dr. Luan Pulling as well.      Respiratory   Pleural effusion, bilateral     Musculoskeletal and Integument   Kyphosis deformity of spine     Other   SOB (shortness of breath)       Imaging: No results found.

## 2013-08-29 NOTE — Patient Instructions (Addendum)
Your physician wants you to follow-up in: 6 months You will receive a reminder letter in the mail two months in advance. If you don't receive a letter, please call our office to schedule the follow-up appointment.   Please observe low sodium diet guidelines I have provided for you   Your physician has recommended you make the following change in your medication:    Take extra 1/2 tablet lasix  (20 mg) today and tomorrow and then go back to regular dose   Start iron tablet 325 mg daily  Take Colace 1 tab daily for constipation

## 2013-08-29 NOTE — Assessment & Plan Note (Signed)
She has noticed increasing lower extremity edema. She is on Mobic 15 mg daily along with amlodipine which can cause some fluid retention and swelling of the lower extremities. However she is also eating a lot of salty foods fried chicken potato chips things of that nature. I have given her a copy of a low sodium diet so that she can be stricter on her intake. I have advised to take an exit dose of Lasix today and tomorrow only, keep her feet elevated in the evening.

## 2013-08-29 NOTE — Progress Notes (Signed)
HPI: Diane Cohen is a 68 year old patient of Dr. Domenic Polite we are following for ongoing assessment and management of atrial fibrillation, hypertension, diastolic dysfunction, with history of mitral regurg. She is on amiodarone therapy. She was last seen by Dr. Domenic Polite in May of 2015. At that time she was recommended to be on anticoagulation, but wished to think about it. Her CHADS VASC score is 5. Amiodarone was decreased to 100 mg daily as she had had no recurrences of atrial fibrillation, she was complaining of shortness of breath and PFTs were obtained.  Dr. Domenic Polite reviewed preliminary report of PFT, stating "Diffusion capacity was reduced 58% predicted. May not be symptom provoking, but sometimes associated with amiodarone. We just decreased her amiodarone dose to eye milligrams daily. We will plan to stay on low dose and perhaps even discontinue it she had no further atrial fibrillation."  A BMET was drawn on 08/28/2013, sodium 137 potassium 4.1 chloride 104 CO2 27 BUN 17 creatinine 0.95. CBC: Hemoglobin 10.1 hematocrit 31.3 white blood cells 4.5 platelets 281.  She said to start on Xarelto and has no complaints of bleeding or tarry stools. Her main complaint today is lower extremity edema which has worsened over the last 3 weeks. Her weight is not up significantly however. The patient is on amlodipine as well as Mobic daily which can continue to lower extremity edema. However she is also eating salty foods at home.  Allergies  Allergen Reactions  . Levaquin [Levofloxacin] Other (See Comments)    Headache   . Sulfa Antibiotics Hives    Current Outpatient Prescriptions  Medication Sig Dispense Refill  . acetaminophen (TYLENOL) 500 MG tablet Take 1,000 mg by mouth every 6 (six) hours as needed.      Marland Kitchen albuterol (PROVENTIL HFA;VENTOLIN HFA) 108 (90 BASE) MCG/ACT inhaler Inhale 1-2 puffs into the lungs every 6 (six) hours as needed for wheezing or shortness of breath.      Marland Kitchen amiodarone  (PACERONE) 200 MG tablet Take 0.5 tablets (100 mg total) by mouth daily.  45 tablet  3  . amLODipine (NORVASC) 5 MG tablet Take 5 mg by mouth daily.      Marland Kitchen escitalopram (LEXAPRO) 20 MG tablet Take 20 mg by mouth at bedtime.      Marland Kitchen estradiol (ESTRACE) 2 MG tablet Take 2 mg by mouth at bedtime.      . furosemide (LASIX) 40 MG tablet Take 40 mg by mouth daily.      Marland Kitchen levothyroxine (SYNTHROID, LEVOTHROID) 100 MCG tablet Take 100 mcg by mouth daily before breakfast.      . lisinopril (PRINIVIL,ZESTRIL) 20 MG tablet Take 1 tablet (20 mg total) by mouth daily.      . magnesium oxide (MAG-OX) 400 MG tablet Take 400 mg by mouth daily.      . meloxicam (MOBIC) 15 MG tablet Take 15 mg by mouth at bedtime.      . metoprolol succinate (TOPROL-XL) 100 MG 24 hr tablet Take 100 mg by mouth daily. Take with or immediately following a meal.      . omeprazole (PRILOSEC) 40 MG capsule Take 40 mg by mouth daily.      . rivaroxaban (XARELTO) 20 MG TABS tablet Take 1 tablet (20 mg total) by mouth daily with supper.  30 tablet  6  . rizatriptan (MAXALT-MLT) 10 MG disintegrating tablet Take 10 mg by mouth as needed for migraine. May repeat in 2 hours if needed      . docusate  sodium (COLACE) 100 MG capsule Take 1 capsule (100 mg total) by mouth daily.  90 capsule  3  . ferrous sulfate 325 (65 FE) MG tablet Take 1 tablet (325 mg total) by mouth daily with breakfast.  90 tablet  3   No current facility-administered medications for this visit.    Past Medical History  Diagnosis Date  . Atrial fibrillation   . Hypertension   . Hypothyroidism   . Pneumonia   . TIA (transient ischemic attack)     Past Surgical History  Procedure Laterality Date  . Abdominal hysterectomy    . Breast surgery    . Cholecystectomy      ROS: Review of systems complete and found to be negative unless listed above  PHYSICAL EXAM BP 138/78  Pulse 63  Ht 5\' 1"  (1.549 m)  Wt 185 lb (83.915 kg)  BMI 34.97 kg/m2 General: Well  developed, well nourished, in no acute distress Head: Eyes PERRLA, No xanthomas.   Normal cephalic and atramatic  Lungs: Clear bilaterally to auscultation and percussion. Heart: HRRR S1 S2, without MRG.  Pulses are 2+ & equal.            No carotid bruit. No JVD.  No abdominal bruits. No femoral bruits. Abdomen: Bowel sounds are positive, abdomen soft and non-tender without masses or                  Hernia's noted. Msk:  Back normal, normal gait. Normal strength and tone for age. Extremities: No clubbing, cyanosis or edema.  DP +1 Neuro: Alert and oriented X 3. Psych:  Good affect, responds appropriately     ASSESSMENT AND PLAN

## 2013-08-29 NOTE — Assessment & Plan Note (Addendum)
Heart rate is currently well-controlled. She will continue metoprolol, and Xarelto 20 mg daily. She offers no complaints of bleeding or rapid heart rhythm. She is also magnesium 400 mg daily. No changes in her medicine concerning atrial fib.  I have noticed on her lab work that she is mildly anemic at 10.1 hemoglobin. Her transit showed her as low as 9.6 and as high as 11.1. She will need to followup with her primary care physician for further evaluation of this and possible anemia panel. I have advised her to take some iron tablets daily. She is also advised that this may cause constipation which is a concern on Xarelto. She is to take Colace 100 mg at at bedtime as a stool softener. She is not to take it if she begins to have some diarrhea.

## 2013-08-29 NOTE — Assessment & Plan Note (Signed)
She has not had any market improvement and her breathing status, but has noticed some fluid retention and difficulty walking which does cause her to have some breathing difficulty. She has use of albuterol inhaler when necessary, which she will use as needed. No wheezes are noted on exam

## 2013-09-06 ENCOUNTER — Encounter: Payer: Self-pay | Admitting: Pulmonary Disease

## 2013-09-20 ENCOUNTER — Ambulatory Visit (INDEPENDENT_AMBULATORY_CARE_PROVIDER_SITE_OTHER): Payer: Medicare PPO | Admitting: Cardiology

## 2013-09-20 ENCOUNTER — Encounter: Payer: Self-pay | Admitting: Cardiology

## 2013-09-20 VITALS — BP 122/64 | HR 60 | Ht 61.0 in | Wt 175.0 lb

## 2013-09-20 DIAGNOSIS — R609 Edema, unspecified: Secondary | ICD-10-CM

## 2013-09-20 DIAGNOSIS — D649 Anemia, unspecified: Secondary | ICD-10-CM

## 2013-09-20 DIAGNOSIS — I4891 Unspecified atrial fibrillation: Secondary | ICD-10-CM

## 2013-09-20 DIAGNOSIS — R600 Localized edema: Secondary | ICD-10-CM

## 2013-09-20 DIAGNOSIS — I480 Paroxysmal atrial fibrillation: Secondary | ICD-10-CM

## 2013-09-20 NOTE — Assessment & Plan Note (Signed)
Patient does not recall any obvious recurrences for the last 4 years at least. I have already reduced her amiodarone to 100 mg daily. We discussed the possibility of stopping it altogether and seeing how she does. She will consider this, and we can review further at her next office visit. Continue Xarelto.

## 2013-09-20 NOTE — Progress Notes (Signed)
Clinical Summary Diane Cohen is a 68 y.o.female just recently seen by Ms. Lawrence NP in late June. She has started on Xarelto since my last visit with her (CHADSVASC score is 5). She has tolerated this without any obvious bleeding problems. She is not reporting any recurring palpitations.  Recent lab work in June showed hemoglobin 10.1 with MCV 86, platelets 261, potassium 4.1, BUN 17, creatinine 0.9. The anemia does not appear to be new. She is followed by Dr. Luan Pulling for this, just recently saw him.  Echocardiogram from January of this year revealed LVEF 60-65% with grade 2 diastolic dysfunction, moderate mitral regurgitation, moderate left atrial enlargement, mildly dilated right ventricle, mild right atrial enlargement, PASP 50 mm mercury.   She had interval PFTs showing reduced diffusion capacity of 58% predicted, amiodarone has been reduced to 100 mg daily.  She states that her leg edema is somewhat improved although not resolved. She was given Zaroxolyn by Dr. Luan Pulling although was not able to tolerate this. We discussed sodium and fluid restriction, and interval intensification of her Lasix.  Allergies  Allergen Reactions  . Levaquin [Levofloxacin] Other (See Comments)    Headache   . Sulfa Antibiotics Hives    Current Outpatient Prescriptions  Medication Sig Dispense Refill  . acetaminophen (TYLENOL) 500 MG tablet Take 1,000 mg by mouth every 6 (six) hours as needed.      Marland Kitchen albuterol (PROVENTIL HFA;VENTOLIN HFA) 108 (90 BASE) MCG/ACT inhaler Inhale 1-2 puffs into the lungs every 6 (six) hours as needed for wheezing or shortness of breath.      Marland Kitchen amiodarone (PACERONE) 200 MG tablet Take 0.5 tablets (100 mg total) by mouth daily.  45 tablet  3  . amLODipine (NORVASC) 5 MG tablet Take 5 mg by mouth daily.      . Calcium Carbonate-Vit D-Min (CALTRATE PLUS PO) Take by mouth 2 (two) times daily.      Marland Kitchen docusate sodium (COLACE) 100 MG capsule Take 1 capsule (100 mg total) by mouth  daily.  90 capsule  3  . escitalopram (LEXAPRO) 20 MG tablet Take 20 mg by mouth at bedtime.      Marland Kitchen estradiol (ESTRACE) 2 MG tablet Take 2 mg by mouth at bedtime.      . furosemide (LASIX) 40 MG tablet Take 40 mg by mouth daily.      Marland Kitchen levothyroxine (SYNTHROID, LEVOTHROID) 100 MCG tablet Take 100 mcg by mouth daily before breakfast.      . lisinopril (PRINIVIL,ZESTRIL) 20 MG tablet Take 1 tablet (20 mg total) by mouth daily.      . magnesium oxide (MAG-OX) 400 MG tablet Take 400 mg by mouth daily.      . meloxicam (MOBIC) 15 MG tablet Take 15 mg by mouth at bedtime.      . metoprolol succinate (TOPROL-XL) 100 MG 24 hr tablet Take 100 mg by mouth daily. Take with or immediately following a meal.      . Multiple Vitamin (MULTIVITAMIN) tablet Take 1 tablet by mouth daily.      Marland Kitchen omeprazole (PRILOSEC) 40 MG capsule Take 40 mg by mouth daily.      . rivaroxaban (XARELTO) 20 MG TABS tablet Take 1 tablet (20 mg total) by mouth daily with supper.  30 tablet  6  . rizatriptan (MAXALT-MLT) 10 MG disintegrating tablet Take 10 mg by mouth as needed for migraine. May repeat in 2 hours if needed      . ferrous sulfate 325 (65  FE) MG tablet Take 1 tablet (325 mg total) by mouth daily with breakfast.  90 tablet  3   No current facility-administered medications for this visit.    Past Medical History  Diagnosis Date  . Atrial fibrillation   . Hypertension   . Hypothyroidism   . Pneumonia   . TIA (transient ischemic attack)     Social History Diane Cohen reports that she has never smoked. She does not have any smokeless tobacco history on file. Diane Cohen reports that she does not drink alcohol.  Review of Systems Other systems reviewed and negative.  Physical Examination Filed Vitals:   09/20/13 0953  BP: 122/64  Pulse: 60   Filed Weights   09/20/13 0953  Weight: 175 lb (79.379 kg)    Overweight woman, appears comfortable.  HEENT: Conjunctiva and lids normal, oropharynx clear.  Neck:  Supple, no elevated JVP or carotid bruits, no thyromegaly.  Lungs: Course but clear to auscultation, nonlabored breathing at rest.  Cardiac: Regular rate and rhythm, 2/6 systolic murmur at the base, no pericardial rub.  Abdomen: Soft, nontender, bowel sounds present, no guarding or rebound.  Extremities: 1-2+ edema below the knees, distal pulses 2+.  Skin: Warm and dry.  Musculoskeletal: No kyphosis.  Neuropsychiatric: Alert and oriented x3, affect grossly appropriate.   Problem List and Plan   Bilateral leg edema Increase Lasix to 80 mg daily for the next 3-5 days, then reduce back to 40 mg daily. If edema recurs we may need to increase her standing dose further. Also recommended sodium and fluid restriction.  Atrial fibrillation Patient does not recall any obvious recurrences for the last 4 years at least. I have already reduced her amiodarone to 100 mg daily. We discussed the possibility of stopping it altogether and seeing how she does. She will consider this, and we can review further at her next office visit. Continue Xarelto.  Anemia Followed by Dr. Luan Pulling.    Satira Sark, M.D., F.A.C.C.

## 2013-09-20 NOTE — Assessment & Plan Note (Addendum)
Increase Lasix to 80 mg daily for the next 3-5 days, then reduce back to 40 mg daily. If edema recurs we may need to increase her standing dose further. Also recommended sodium and fluid restriction.

## 2013-09-20 NOTE — Assessment & Plan Note (Signed)
Followed by Dr. Hawkins. 

## 2013-09-20 NOTE — Patient Instructions (Signed)
Your physician recommends that you schedule a follow-up appointment in:  4 months with Dr Domenic Polite   Your physician has recommended you make the following change in your medication:   Take Lasix 40 mg twice a day for 2 days to a week. Then go back on regular dose at 40 mg daily.

## 2013-10-20 ENCOUNTER — Other Ambulatory Visit: Payer: Self-pay

## 2013-10-20 DIAGNOSIS — I4891 Unspecified atrial fibrillation: Secondary | ICD-10-CM

## 2013-10-20 DIAGNOSIS — R55 Syncope and collapse: Secondary | ICD-10-CM

## 2013-10-20 DIAGNOSIS — I482 Chronic atrial fibrillation, unspecified: Secondary | ICD-10-CM

## 2013-11-21 ENCOUNTER — Telehealth: Payer: Self-pay | Admitting: *Deleted

## 2013-11-21 NOTE — Telephone Encounter (Signed)
Received end of service report, in Dr. Domenic Polite folder

## 2013-11-22 ENCOUNTER — Other Ambulatory Visit: Payer: Self-pay | Admitting: *Deleted

## 2013-11-22 DIAGNOSIS — I4891 Unspecified atrial fibrillation: Secondary | ICD-10-CM

## 2013-12-28 ENCOUNTER — Inpatient Hospital Stay (HOSPITAL_COMMUNITY)
Admission: EM | Admit: 2013-12-28 | Discharge: 2013-12-30 | DRG: 293 | Disposition: A | Discharge status: Home / Self Care | Payer: Medicare PPO | Attending: Pulmonary Disease | Admitting: Pulmonary Disease

## 2013-12-28 ENCOUNTER — Encounter (HOSPITAL_COMMUNITY): Payer: Self-pay | Admitting: Emergency Medicine

## 2013-12-28 ENCOUNTER — Emergency Department (HOSPITAL_COMMUNITY): Payer: Medicare PPO

## 2013-12-28 DIAGNOSIS — Z7901 Long term (current) use of anticoagulants: Secondary | ICD-10-CM

## 2013-12-28 DIAGNOSIS — I480 Paroxysmal atrial fibrillation: Secondary | ICD-10-CM | POA: Diagnosis present

## 2013-12-28 DIAGNOSIS — I482 Chronic atrial fibrillation: Secondary | ICD-10-CM | POA: Diagnosis present

## 2013-12-28 DIAGNOSIS — I5031 Acute diastolic (congestive) heart failure: Secondary | ICD-10-CM

## 2013-12-28 DIAGNOSIS — E039 Hypothyroidism, unspecified: Secondary | ICD-10-CM | POA: Diagnosis present

## 2013-12-28 DIAGNOSIS — I509 Heart failure, unspecified: Secondary | ICD-10-CM

## 2013-12-28 DIAGNOSIS — Z23 Encounter for immunization: Secondary | ICD-10-CM | POA: Diagnosis not present

## 2013-12-28 DIAGNOSIS — Z8249 Family history of ischemic heart disease and other diseases of the circulatory system: Secondary | ICD-10-CM

## 2013-12-28 DIAGNOSIS — I5032 Chronic diastolic (congestive) heart failure: Secondary | ICD-10-CM | POA: Diagnosis present

## 2013-12-28 DIAGNOSIS — R0902 Hypoxemia: Secondary | ICD-10-CM | POA: Diagnosis present

## 2013-12-28 DIAGNOSIS — Z8673 Personal history of transient ischemic attack (TIA), and cerebral infarction without residual deficits: Secondary | ICD-10-CM | POA: Diagnosis not present

## 2013-12-28 DIAGNOSIS — R600 Localized edema: Secondary | ICD-10-CM

## 2013-12-28 DIAGNOSIS — I10 Essential (primary) hypertension: Secondary | ICD-10-CM | POA: Diagnosis present

## 2013-12-28 DIAGNOSIS — I5033 Acute on chronic diastolic (congestive) heart failure: Principal | ICD-10-CM

## 2013-12-28 DIAGNOSIS — I5030 Unspecified diastolic (congestive) heart failure: Secondary | ICD-10-CM

## 2013-12-28 DIAGNOSIS — R0602 Shortness of breath: Secondary | ICD-10-CM

## 2013-12-28 DIAGNOSIS — I4891 Unspecified atrial fibrillation: Secondary | ICD-10-CM

## 2013-12-28 DIAGNOSIS — I4819 Other persistent atrial fibrillation: Secondary | ICD-10-CM | POA: Diagnosis present

## 2013-12-28 DIAGNOSIS — I5043 Acute on chronic combined systolic (congestive) and diastolic (congestive) heart failure: Secondary | ICD-10-CM

## 2013-12-28 LAB — URINALYSIS, ROUTINE W REFLEX MICROSCOPIC
Bilirubin Urine: NEGATIVE
GLUCOSE, UA: NEGATIVE mg/dL
Hgb urine dipstick: NEGATIVE
Ketones, ur: NEGATIVE mg/dL
LEUKOCYTES UA: NEGATIVE
Nitrite: NEGATIVE
PH: 5.5 (ref 5.0–8.0)
Protein, ur: NEGATIVE mg/dL
Specific Gravity, Urine: <1.005 — ABNORMAL LOW (ref 1.005–1.030)
Urobilinogen, UA: 0.2 mg/dL (ref 0.0–1.0)

## 2013-12-28 LAB — CBC WITH DIFFERENTIAL/PLATELET
BASOS ABS: 0 10*3/uL (ref 0.0–0.1)
BASOS PCT: 0 % (ref 0–1)
Eosinophils Absolute: 0.1 10*3/uL (ref 0.0–0.7)
Eosinophils Relative: 2 % (ref 0–5)
HEMATOCRIT: 34.3 % — AB (ref 36.0–46.0)
Hemoglobin: 11 g/dL — ABNORMAL LOW (ref 12.0–15.0)
Lymphocytes Relative: 19 % (ref 12–46)
Lymphs Abs: 1 10*3/uL (ref 0.7–4.0)
MCH: 30.6 pg (ref 26.0–34.0)
MCHC: 32.1 g/dL (ref 30.0–36.0)
MCV: 95.5 fL (ref 78.0–100.0)
Monocytes Absolute: 0.3 10*3/uL (ref 0.1–1.0)
Monocytes Relative: 6 % (ref 3–12)
NEUTROS ABS: 4.1 10*3/uL (ref 1.7–7.7)
Neutrophils Relative %: 73 % (ref 43–77)
PLATELETS: 236 10*3/uL (ref 150–400)
RBC: 3.59 MIL/uL — ABNORMAL LOW (ref 3.87–5.11)
RDW: 17 % — AB (ref 11.5–15.5)
WBC: 5.5 10*3/uL (ref 4.0–10.5)

## 2013-12-28 LAB — COMPREHENSIVE METABOLIC PANEL
ALT: 39 U/L — ABNORMAL HIGH (ref 0–35)
AST: 57 U/L — AB (ref 0–37)
Albumin: 3.6 g/dL (ref 3.5–5.2)
Alkaline Phosphatase: 162 U/L — ABNORMAL HIGH (ref 39–117)
Anion gap: 11 (ref 5–15)
BILIRUBIN TOTAL: 0.6 mg/dL (ref 0.3–1.2)
BUN: 17 mg/dL (ref 6–23)
CALCIUM: 9.5 mg/dL (ref 8.4–10.5)
CHLORIDE: 101 meq/L (ref 96–112)
CO2: 27 mEq/L (ref 19–32)
Creatinine, Ser: 0.82 mg/dL (ref 0.50–1.10)
GFR calc Af Amer: 83 mL/min — ABNORMAL LOW (ref >90)
GFR calc non Af Amer: 72 mL/min — ABNORMAL LOW (ref >90)
Glucose, Bld: 91 mg/dL (ref 70–99)
Potassium: 3.7 mEq/L (ref 3.7–5.3)
Sodium: 139 mEq/L (ref 137–147)
Total Protein: 7.8 g/dL (ref 6.0–8.3)

## 2013-12-28 LAB — PRO B NATRIURETIC PEPTIDE: Pro B Natriuretic peptide (BNP): 2063 pg/mL — ABNORMAL HIGH (ref 0–125)

## 2013-12-28 LAB — TROPONIN I

## 2013-12-28 MED ORDER — ALBUTEROL SULFATE (2.5 MG/3ML) 0.083% IN NEBU: 3.0000 mL | INHALATION_SOLUTION | Freq: Four times a day (QID) | RESPIRATORY_TRACT | Status: DC | PRN

## 2013-12-28 MED ORDER — LISINOPRIL 10 MG PO TABS
20.0000 mg | ORAL_TABLET | Freq: Every day | ORAL | Status: DC
  Administered 2013-12-29 – 2013-12-30 (×2): 20 mg via ORAL
  Filled 2013-12-28 (×3): qty 2

## 2013-12-28 MED ORDER — ONDANSETRON HCL 4 MG PO TABS: 4.0000 mg | ORAL_TABLET | Freq: Four times a day (QID) | ORAL | Status: DC | PRN

## 2013-12-28 MED ORDER — LEVOTHYROXINE SODIUM 100 MCG PO TABS
100.0000 ug | ORAL_TABLET | Freq: Every day | ORAL | Status: DC
  Administered 2013-12-29 – 2013-12-30 (×2): 100 ug via ORAL
  Filled 2013-12-28 (×2): qty 1

## 2013-12-28 MED ORDER — INFLUENZA VAC SPLIT QUAD 0.5 ML IM SUSY
0.5000 mL | PREFILLED_SYRINGE | INTRAMUSCULAR | Status: AC
  Administered 2013-12-29: 0.5 mL via INTRAMUSCULAR
  Filled 2013-12-28: qty 0.5

## 2013-12-28 MED ORDER — FUROSEMIDE 10 MG/ML IJ SOLN
40.0000 mg | Freq: Two times a day (BID) | INTRAMUSCULAR | Status: DC
  Administered 2013-12-28 – 2013-12-29 (×2): 40 mg via INTRAVENOUS
  Filled 2013-12-28 (×2): qty 4

## 2013-12-28 MED ORDER — MELOXICAM 7.5 MG PO TABS
ORAL_TABLET | ORAL | Status: AC
  Filled 2013-12-28: qty 2

## 2013-12-28 MED ORDER — AMLODIPINE BESYLATE 5 MG PO TABS
5.0000 mg | ORAL_TABLET | Freq: Every day | ORAL | Status: DC
  Administered 2013-12-28 – 2013-12-30 (×3): 5 mg via ORAL
  Filled 2013-12-28 (×3): qty 1

## 2013-12-28 MED ORDER — ONDANSETRON HCL 4 MG/2ML IJ SOLN: 4.0000 mg | Freq: Four times a day (QID) | INTRAMUSCULAR | Status: DC | PRN

## 2013-12-28 MED ORDER — RIVAROXABAN 20 MG PO TABS
20.0000 mg | ORAL_TABLET | Freq: Every day | ORAL | Status: DC
  Administered 2013-12-28 – 2013-12-29 (×2): 20 mg via ORAL
  Filled 2013-12-28 (×2): qty 1

## 2013-12-28 MED ORDER — MAGNESIUM OXIDE 400 (241.3 MG) MG PO TABS
400.0000 mg | ORAL_TABLET | Freq: Every day | ORAL | Status: DC
  Administered 2013-12-29 – 2013-12-30 (×2): 400 mg via ORAL
  Filled 2013-12-28 (×4): qty 1

## 2013-12-28 MED ORDER — ESCITALOPRAM OXALATE 10 MG PO TABS
20.0000 mg | ORAL_TABLET | Freq: Every day | ORAL | Status: DC
  Administered 2013-12-28 – 2013-12-29 (×2): 20 mg via ORAL
  Filled 2013-12-28 (×2): qty 2

## 2013-12-28 MED ORDER — FERROUS SULFATE 325 (65 FE) MG PO TABS
325.0000 mg | ORAL_TABLET | Freq: Every day | ORAL | Status: DC
  Administered 2013-12-29 – 2013-12-30 (×2): 325 mg via ORAL
  Filled 2013-12-28 (×2): qty 1

## 2013-12-28 MED ORDER — METOPROLOL SUCCINATE ER 50 MG PO TB24
100.0000 mg | ORAL_TABLET | Freq: Every day | ORAL | Status: DC
Start: 2013-12-28 — End: 2013-12-30
  Administered 2013-12-29 – 2013-12-30 (×2): 100 mg via ORAL
  Filled 2013-12-28 (×3): qty 2

## 2013-12-28 MED ORDER — SODIUM CHLORIDE 0.9 % IJ SOLN
3.0000 mL | Freq: Two times a day (BID) | INTRAMUSCULAR | Status: DC
  Administered 2013-12-28 – 2013-12-29 (×3): 3 mL via INTRAVENOUS

## 2013-12-28 MED ORDER — AMIODARONE HCL 200 MG PO TABS
100.0000 mg | ORAL_TABLET | Freq: Every day | ORAL | Status: DC
Start: 2013-12-28 — End: 2013-12-30
  Administered 2013-12-29 – 2013-12-30 (×2): 100 mg via ORAL
  Filled 2013-12-28 (×3): qty 1

## 2013-12-28 MED ORDER — MAGNESIUM OXIDE 400 MG PO TABS: 400.0000 mg | ORAL_TABLET | Freq: Every day | ORAL | Status: DC

## 2013-12-28 MED ORDER — MELOXICAM 7.5 MG PO TABS
15.0000 mg | ORAL_TABLET | Freq: Every day | ORAL | Status: DC
Start: 2013-12-28 — End: 2013-12-30
  Administered 2013-12-28 – 2013-12-29 (×2): 15 mg via ORAL
  Filled 2013-12-28 (×3): qty 2
  Filled 2013-12-28: qty 1

## 2013-12-28 MED ORDER — ESTRADIOL 1 MG PO TABS
2.0000 mg | ORAL_TABLET | Freq: Every day | ORAL | Status: DC
  Administered 2013-12-28 – 2013-12-29 (×2): 2 mg via ORAL
  Filled 2013-12-28: qty 2
  Filled 2013-12-28: qty 1
  Filled 2013-12-28 (×2): qty 2

## 2013-12-28 MED ORDER — FUROSEMIDE 10 MG/ML IJ SOLN
80.0000 mg | Freq: Once | INTRAMUSCULAR | Status: AC
  Administered 2013-12-28: 80 mg via INTRAVENOUS
  Filled 2013-12-28: qty 8

## 2013-12-28 MED ORDER — PANTOPRAZOLE SODIUM 40 MG PO TBEC
40.0000 mg | DELAYED_RELEASE_TABLET | Freq: Every day | ORAL | Status: DC
  Administered 2013-12-28 – 2013-12-30 (×3): 40 mg via ORAL
  Filled 2013-12-28 (×3): qty 1

## 2013-12-28 MED ORDER — ESTRADIOL 1 MG PO TABS
ORAL_TABLET | ORAL | Status: AC
  Filled 2013-12-28: qty 2

## 2013-12-28 MED ORDER — DOCUSATE SODIUM 100 MG PO CAPS
100.0000 mg | ORAL_CAPSULE | Freq: Every day | ORAL | Status: DC
  Administered 2013-12-28 – 2013-12-30 (×3): 100 mg via ORAL
  Filled 2013-12-28 (×3): qty 1

## 2013-12-28 MED ORDER — ACETAMINOPHEN 500 MG PO TABS
1000.0000 mg | ORAL_TABLET | Freq: Four times a day (QID) | ORAL | Status: DC | PRN
  Administered 2013-12-29 – 2013-12-30 (×3): 1000 mg via ORAL
  Filled 2013-12-28 (×3): qty 2

## 2013-12-28 NOTE — ED Notes (Addendum)
Pt's O2 sat consistently between 89 and 90%. Pt placed on O2 at 2L via [place]. Dr. Betsey Holiday informed.

## 2013-12-28 NOTE — H&P (Signed)
[facility] History and Physical  MICKIE BADDERS UVO:[id number] DOB: [birth year] DOA: 12/28/2013  Referring physician: ER PCP: Alonza Bogus, MD   Chief Complaint: Dyspnea  HPI: Diane Cohen is a 68 y.o. female  This is a 68 year old lady who has a previous history of diastolic dysfunction, atrial fibrillation and now presents with approximately one week history of dyspnea on exertion. She denies any orthopnea or PND. She is also had some lower leg/ankle swelling. She does also take amlodipine and meloxicam. She has been compliant with her medications including Lasix daily. She denies any chest pain. Evaluation in the emergency room is consistent with congestive heart failure. She is now being admitted for further management.   Review of Systems:  Constitutional:  No weight loss, night sweats, Fevers, chills, fatigue.  HEENT:  No headaches, Difficulty swallowing,Tooth/dental problems,Sore throat,  No sneezing, itching, ear ache, nasal congestion, post nasal drip,   GI:  No heartburn, indigestion, abdominal pain, nausea, vomiting, diarrhea, change in bowel habits, loss of appetite   Skin:  no rash or lesions.  GU:  no dysuria, change in color of urine, no urgency or frequency. No flank pain.  Musculoskeletal:  No joint pain or swelling. No decreased range of motion. No back pain.  Psych:  No change in mood or affect. No depression or anxiety. No memory loss.   Past Medical History  Diagnosis Date  . Atrial fibrillation   . Hypertension   . Hypothyroidism   . Pneumonia   . TIA (transient ischemic attack)    Past Surgical History  Procedure Laterality Date  . Abdominal hysterectomy    . Breast surgery    . Cholecystectomy     Social History:  reports that she has never smoked. She does not have any smokeless tobacco history on file. She reports that she does not drink alcohol or use illicit drugs.  Allergies  Allergen Reactions  . Levaquin [Levofloxacin] Other  (See Comments)    Headache   . Sulfa Antibiotics Hives    No family history on file.   Prior to Admission medications   Medication Sig Start Date End Date Taking? Authorizing Provider  acetaminophen (TYLENOL) 500 MG tablet Take 1,000 mg by mouth every 6 (six) hours as needed.    Historical Provider, MD  albuterol (PROVENTIL HFA;VENTOLIN HFA) 108 (90 BASE) MCG/ACT inhaler Inhale 1-2 puffs into the lungs every 6 (six) hours as needed for wheezing or shortness of breath.    Historical Provider, MD  amiodarone (PACERONE) 200 MG tablet Take 0.5 tablets (100 mg total) by mouth daily. 08/08/13   Satira Sark, MD  amLODipine (NORVASC) 5 MG tablet Take 5 mg by mouth daily.    Historical Provider, MD  Calcium Carbonate-Vit D-Min (CALTRATE PLUS PO) Take by mouth 2 (two) times daily.    Historical Provider, MD  docusate sodium (COLACE) 100 MG capsule Take 1 capsule (100 mg total) by mouth daily. 08/29/13   Lendon Colonel, NP  escitalopram (LEXAPRO) 20 MG tablet Take 20 mg by mouth at bedtime.    Historical Provider, MD  estradiol (ESTRACE) 2 MG tablet Take 2 mg by mouth at bedtime.    Historical Provider, MD  ferrous sulfate 325 (65 FE) MG tablet Take 1 tablet (325 mg total) by mouth daily with breakfast. 08/29/13   Lendon Colonel, NP  furosemide (LASIX) 40 MG tablet Take 40 mg by mouth daily.    Historical Provider, MD  levothyroxine (SYNTHROID, LEVOTHROID) 100 MCG tablet  Take 100 mcg by mouth daily before breakfast.    Historical Provider, MD  lisinopril (PRINIVIL,ZESTRIL) 20 MG tablet Take 1 tablet (20 mg total) by mouth daily. 04/05/13   Ripudeep Krystal Eaton, MD  magnesium oxide (MAG-OX) 400 MG tablet Take 400 mg by mouth daily.    Historical Provider, MD  meloxicam (MOBIC) 15 MG tablet Take 15 mg by mouth at bedtime.    Historical Provider, MD  metoprolol succinate (TOPROL-XL) 100 MG 24 hr tablet Take 100 mg by mouth daily. Take with or immediately following a meal.    Historical Provider, MD    Multiple Vitamin (MULTIVITAMIN) tablet Take 1 tablet by mouth daily.    Historical Provider, MD  omeprazole (PRILOSEC) 40 MG capsule Take 40 mg by mouth daily.    Historical Provider, MD  rivaroxaban (XARELTO) 20 MG TABS tablet Take 1 tablet (20 mg total) by mouth daily with supper. 07/28/13   Satira Sark, MD  rizatriptan (MAXALT-MLT) 10 MG disintegrating tablet Take 10 mg by mouth as needed for migraine. May repeat in 2 hours if needed    Historical Provider, MD   Physical Exam: Filed Vitals:   12/28/13 1600 12/28/13 1716 12/28/13 1721 12/28/13 1743  BP: 179/74 184/53 184/53 196/75  Pulse: 65 61 62 64  Temp:   98.9 F (37.2 C) 97.8 F (36.6 C)  TempSrc:   Oral Oral  Resp: 22 20 22 20   Height:      Weight:      SpO2:  98% 100% 99%    Wt Readings from Last 3 Encounters:  12/28/13 83.008 kg (183 lb)  09/20/13 79.379 kg (175 lb)  08/29/13 83.915 kg (185 lb)    General:  Appears calm and comfortable. There is no increased work of breathing at rest. There is no peripheral or central cyanosis. Eyes: PERRL, normal lids, irises & conjunctiva ENT: grossly normal hearing, lips & tongue Neck: no LAD, masses or thyromegaly Cardiovascular: RRR, no m/r/g. Pitting ankle edema. Telemetry: SR, no arrhythmias  Respiratory: CTA bilaterally, no w/r/r. Normal respiratory effort. Abdomen: soft, ntnd Skin: no rash or induration seen on limited exam Musculoskeletal: grossly normal tone BUE/BLE Psychiatric: grossly normal mood and affect, speech fluent and appropriate Neurologic: grossly non-focal.          Labs on Admission:  Basic Metabolic Panel:  Recent Labs Lab 12/28/13 1500  NA 139  K 3.7  CL 101  CO2 27  GLUCOSE 91  BUN 17  CREATININE 0.82  CALCIUM 9.5   Liver Function Tests:  Recent Labs Lab 12/28/13 1500  AST 57*  ALT 39*  ALKPHOS 162*  BILITOT 0.6  PROT 7.8  ALBUMIN 3.6   No results found for this basename: LIPASE, AMYLASE,  in the last 168 hours No  results found for this basename: AMMONIA,  in the last 168 hours CBC:  Recent Labs Lab 12/28/13 1500  WBC 5.5  NEUTROABS 4.1  HGB 11.0*  HCT 34.3*  MCV 95.5  PLT 236   Cardiac Enzymes:  Recent Labs Lab 12/28/13 1500  TROPONINI <0.30    BNP (last 3 results)  Recent Labs  03/31/13 1550 12/28/13 1500  PROBNP 1241.0* 2063.0*   CBG: No results found for this basename: GLUCAP,  in the last 168 hours  Radiological Exams on Admission: Dg Chest 2 View  12/28/2013   CLINICAL DATA:  Chest congestion for 1 week. Worsening shortness of breath. Hypertension.  EXAM: CHEST  2 VIEW  COMPARISON:  Multiple exams,  including 07/26/2013  FINDINGS: Bilateral hilar prominence. Mild enlargement of the cardiopericardial silhouette. Atherosclerotic aortic arch. Small bilateral pleural effusions with suspected subsegmental atelectasis in the lung bases, left greater than right. Indistinct pulmonary vasculature. Thoracic spondylosis. Mild airway thickening centrally.  IMPRESSION: 1. Cardiomegaly with indistinct pulmonary vasculature favoring pulmonary venous hypertension. Borderline appearance for interstitial edema. 2. Airway thickening is present, suggesting bronchitis or reactive airways disease. 3. Small bilateral pleural effusions with bibasilar subsegmental atelectasis.   Electronically Signed   By: Sherryl Barters M.D.   On: 12/28/2013 15:58    EKG: Independently reviewed. Sinus rhythm without any acute ST-T wave changes.  Assessment/Plan   1. Acute on chronic Diastolic congestive heart failure. 2. Hypertension. 3. History of atrial fibrillation on chronic anticoagulation therapy.  Plan: 1. Admit to telemetry. 2. Intravenous diuresis. Monitor daily weights and input/output. 3. Cardiology consultation.  Further recommendations will depend on patient's hospital progress.  Code Status: Full code.   Family Communication: I discussed the plan with patient at the bedside.   Disposition  Plan: Home when medically stable, possibly in the next 24-48 hours.   Time spent: 60 minutes.  Doree Albee [facility] Pager [phone].

## 2013-12-28 NOTE — ED Notes (Signed)
Short of breath , worse for the last 2 days

## 2013-12-28 NOTE — ED Provider Notes (Signed)
CSN: [id number]     Arrival date & time 12/28/13  1405 History  This chart was scribed for Diane Cohen, * by Dellis Filbert, ED Scribe. The patient was seen in [place] and the patient's care was started at 3:11 PM.  Chief Complaint  Patient presents with  . Shortness of Breath    Patient is a 68 y.o. female presenting with shortness of breath. The history is provided by the patient. No language interpreter was used.  Shortness of Breath  HPI Comments: Diane Cohen is a 68 y.o. female who presents to the Emergency Department complaining of SOB, she notes is began 7-10 days ago but has worsened the past 2 days ago. She notes physical activity makes the shortness of breath worse. She also notes her legs are more swollen than usual. Denies chest pain. No fever or cough.   Past Medical History  Diagnosis Date  . Atrial fibrillation   . Hypertension   . Hypothyroidism   . Pneumonia   . TIA (transient ischemic attack)    Past Surgical History  Procedure Laterality Date  . Abdominal hysterectomy    . Breast surgery    . Cholecystectomy     No family history on file. History  Substance Use Topics  . Smoking status: Never Smoker   . Smokeless tobacco: Not on file  . Alcohol Use: No   OB History   Grav Para Term Preterm Abortions TAB SAB Ect Mult Living                 Review of Systems  Respiratory: Positive for shortness of breath.   Cardiovascular: Positive for leg swelling.  All other systems reviewed and are negative.     Allergies  Levaquin and Sulfa antibiotics  Home Medications   Prior to Admission medications   Medication Sig Start Date End Date Taking? Authorizing Provider  acetaminophen (TYLENOL) 500 MG tablet Take 1,000 mg by mouth every 6 (six) hours as needed.    Historical Provider, MD  albuterol (PROVENTIL HFA;VENTOLIN HFA) 108 (90 BASE) MCG/ACT inhaler Inhale 1-2 puffs into the lungs every 6 (six) hours as needed for wheezing or  shortness of breath.    Historical Provider, MD  amiodarone (PACERONE) 200 MG tablet Take 0.5 tablets (100 mg total) by mouth daily. 08/08/13   Satira Sark, MD  amLODipine (NORVASC) 5 MG tablet Take 5 mg by mouth daily.    Historical Provider, MD  Calcium Carbonate-Vit D-Min (CALTRATE PLUS PO) Take by mouth 2 (two) times daily.    Historical Provider, MD  docusate sodium (COLACE) 100 MG capsule Take 1 capsule (100 mg total) by mouth daily. 08/29/13   Lendon Colonel, NP  escitalopram (LEXAPRO) 20 MG tablet Take 20 mg by mouth at bedtime.    Historical Provider, MD  estradiol (ESTRACE) 2 MG tablet Take 2 mg by mouth at bedtime.    Historical Provider, MD  ferrous sulfate 325 (65 FE) MG tablet Take 1 tablet (325 mg total) by mouth daily with breakfast. 08/29/13   Lendon Colonel, NP  furosemide (LASIX) 40 MG tablet Take 40 mg by mouth daily.    Historical Provider, MD  levothyroxine (SYNTHROID, LEVOTHROID) 100 MCG tablet Take 100 mcg by mouth daily before breakfast.    Historical Provider, MD  lisinopril (PRINIVIL,ZESTRIL) 20 MG tablet Take 1 tablet (20 mg total) by mouth daily. 04/05/13   Ripudeep Krystal Eaton, MD  magnesium oxide (MAG-OX) 400 MG tablet Take 400  mg by mouth daily.    Historical Provider, MD  meloxicam (MOBIC) 15 MG tablet Take 15 mg by mouth at bedtime.    Historical Provider, MD  metoprolol succinate (TOPROL-XL) 100 MG 24 hr tablet Take 100 mg by mouth daily. Take with or immediately following a meal.    Historical Provider, MD  Multiple Vitamin (MULTIVITAMIN) tablet Take 1 tablet by mouth daily.    Historical Provider, MD  omeprazole (PRILOSEC) 40 MG capsule Take 40 mg by mouth daily.    Historical Provider, MD  rivaroxaban (XARELTO) 20 MG TABS tablet Take 1 tablet (20 mg total) by mouth daily with supper. 07/28/13   Satira Sark, MD  rizatriptan (MAXALT-MLT) 10 MG disintegrating tablet Take 10 mg by mouth as needed for migraine. May repeat in 2 hours if needed    Historical  Provider, MD   BP 179/74  Pulse 65  Temp(Src) 98.1 F (36.7 C) (Oral)  Resp 22  Ht 5\' 1"  (1.549 m)  Wt 183 lb (83.008 kg)  BMI 34.60 kg/m2  SpO2 96% Physical Exam  Constitutional: She is oriented to person, place, and time. She appears well-developed and well-nourished. No distress.  HENT:  Head: Normocephalic and atraumatic.  Right Ear: Hearing normal.  Left Ear: Hearing normal.  Nose: Nose normal.  Mouth/Throat: Oropharynx is clear and moist and mucous membranes are normal.  Eyes: Conjunctivae and EOM are normal. Pupils are equal, round, and reactive to light.  Neck: Normal range of motion. Neck supple.  Cardiovascular: Regular rhythm, S1 normal and S2 normal.  Exam reveals no gallop and no friction rub.   No murmur heard. Pulmonary/Chest: Effort normal. No respiratory distress. She has rales. She exhibits no tenderness.  Abdominal: Soft. Normal appearance and bowel sounds are normal. There is no hepatosplenomegaly. There is no tenderness. There is no rebound, no guarding, no tenderness at McBurney's point and negative Murphy's sign. No hernia.  Musculoskeletal: Normal range of motion. She exhibits edema.  Neurological: She is alert and oriented to person, place, and time. She has normal strength. No cranial nerve deficit or sensory deficit. Coordination normal. GCS eye subscore is 4. GCS verbal subscore is 5. GCS motor subscore is 6.  Skin: Skin is warm, dry and intact. No rash noted. No cyanosis.  Psychiatric: She has a normal mood and affect. Her speech is normal and behavior is normal. Thought content normal.    ED Course  Procedures DIAGNOSTIC STUDIES: Oxygen Saturation is 96% on room air, adequate by my interpretation.    COORDINATION OF CARE: 3:14 PM Discussed treatment plan with pt at bedside and pt agreed to plan.   Labs Review Labs Reviewed  CBC WITH DIFFERENTIAL - Abnormal; Notable for the following:    RBC 3.59 (*)    Hemoglobin 11.0 (*)    HCT 34.3 (*)     RDW 17.0 (*)    All other components within normal limits  COMPREHENSIVE METABOLIC PANEL - Abnormal; Notable for the following:    AST 57 (*)    ALT 39 (*)    Alkaline Phosphatase 162 (*)    GFR calc non Af Amer 72 (*)    GFR calc Af Amer 83 (*)    All other components within normal limits  PRO B NATRIURETIC PEPTIDE - Abnormal; Notable for the following:    Pro B Natriuretic peptide (BNP) 2063.0 (*)    All other components within normal limits  TROPONIN I  URINALYSIS, ROUTINE W REFLEX MICROSCOPIC  Imaging Review Dg Chest 2 View  12/28/2013   CLINICAL DATA:  Chest congestion for 1 week. Worsening shortness of breath. Hypertension.  EXAM: CHEST  2 VIEW  COMPARISON:  Multiple exams, including 07/26/2013  FINDINGS: Bilateral hilar prominence. Mild enlargement of the cardiopericardial silhouette. Atherosclerotic aortic arch. Small bilateral pleural effusions with suspected subsegmental atelectasis in the lung bases, left greater than right. Indistinct pulmonary vasculature. Thoracic spondylosis. Mild airway thickening centrally.  IMPRESSION: 1. Cardiomegaly with indistinct pulmonary vasculature favoring pulmonary venous hypertension. Borderline appearance for interstitial edema. 2. Airway thickening is present, suggesting bronchitis or reactive airways disease. 3. Small bilateral pleural effusions with bibasilar subsegmental atelectasis.   Electronically Signed   By: Sherryl Barters M.D.   On: 12/28/2013 15:58     EKG Interpretation   Date/Time:  Thursday December 28 2013 15:08:49 EDT Ventricular Rate:  66 PR Interval:  159 QRS Duration: 95 QT Interval:  435 QTC Calculation: 456 R Axis:   122 Text Interpretation:  Sinus rhythm Right axis deviation Probable  anteroseptal infarct, old No significant change since last tracing  Confirmed by Kayin Kettering  MD, [place] [phone]) on 12/28/2013 4:29:36 PM      MDM   Final diagnoses:  Shortness of breath   congestive heart  failure  Presents to the ER for evaluation of shortness of breath. Patient reports that she has been feeling badly for more than a week, has had significant worsening of her shortness of breath the last 2 days. Patient has an examination concerning for decompensated congestive heart failure. Patient has increased work of breathing, rales on examination with lower extremity edema. Chest x-ray does show cardiomegaly with vascular congestion. BNP is elevated. Patient is hypoxic here in the ER, room air oxygen saturation 80-89% at rest. Patient requiring supplement oxygen. Patient administered IV Lasix and will be admitted to the hospital for further management.  I personally performed the services described in this documentation, which was scribed in my presence. The recorded information has been reviewed and is accurate.    Diane Greek, MD 12/28/13 [phone]

## 2013-12-29 ENCOUNTER — Encounter (HOSPITAL_COMMUNITY): Payer: Self-pay | Admitting: Adult Health

## 2013-12-29 DIAGNOSIS — R0602 Shortness of breath: Secondary | ICD-10-CM

## 2013-12-29 DIAGNOSIS — I059 Rheumatic mitral valve disease, unspecified: Secondary | ICD-10-CM

## 2013-12-29 DIAGNOSIS — I5032 Chronic diastolic (congestive) heart failure: Secondary | ICD-10-CM | POA: Diagnosis present

## 2013-12-29 DIAGNOSIS — I4891 Unspecified atrial fibrillation: Secondary | ICD-10-CM

## 2013-12-29 DIAGNOSIS — I5033 Acute on chronic diastolic (congestive) heart failure: Secondary | ICD-10-CM | POA: Diagnosis not present

## 2013-12-29 LAB — COMPREHENSIVE METABOLIC PANEL
ALBUMIN: 3.3 g/dL — AB (ref 3.5–5.2)
ALK PHOS: 141 U/L — AB (ref 39–117)
ALT: 30 U/L (ref 0–35)
ANION GAP: 10 (ref 5–15)
AST: 38 U/L — ABNORMAL HIGH (ref 0–37)
BILIRUBIN TOTAL: 0.8 mg/dL (ref 0.3–1.2)
BUN: 18 mg/dL (ref 6–23)
CHLORIDE: 101 meq/L (ref 96–112)
CO2: 32 mEq/L (ref 19–32)
Calcium: 9.3 mg/dL (ref 8.4–10.5)
Creatinine, Ser: 1.01 mg/dL (ref 0.50–1.10)
GFR calc non Af Amer: 56 mL/min — ABNORMAL LOW (ref >90)
GFR, EST AFRICAN AMERICAN: 65 mL/min — AB (ref >90)
GLUCOSE: 84 mg/dL (ref 70–99)
Potassium: 3.3 mEq/L — ABNORMAL LOW (ref 3.7–5.3)
SODIUM: 143 meq/L (ref 137–147)
Total Protein: 6.9 g/dL (ref 6.0–8.3)

## 2013-12-29 LAB — MAGNESIUM: Magnesium: 2.1 mg/dL (ref 1.5–2.5)

## 2013-12-29 LAB — PRO B NATRIURETIC PEPTIDE: Pro B Natriuretic peptide (BNP): 1859 pg/mL — ABNORMAL HIGH (ref 0–125)

## 2013-12-29 MED ORDER — POTASSIUM CHLORIDE CRYS ER 20 MEQ PO TBCR
40.0000 meq | EXTENDED_RELEASE_TABLET | Freq: Two times a day (BID) | ORAL | Status: AC
  Administered 2013-12-29 (×2): 40 meq via ORAL
  Filled 2013-12-29 (×2): qty 2

## 2013-12-29 MED ORDER — FUROSEMIDE 10 MG/ML IJ SOLN: 40.0000 mg | Freq: Every day | INTRAMUSCULAR | Status: DC

## 2013-12-29 NOTE — Consult Note (Signed)
CARDIOLOGY CONSULT NOTE   Patient ID: Diane Cohen MRN: [id number] DOB/AGE: January 14, 1946 68 y.o.  Admit Date: 12/28/2013 Referring Physician: PTH Primary Physician: Alonza Bogus, MD Consulting Cardiologist: Carlyle Dolly MD Primary Cardiologist: Rozann Lesches MD Reason for Consultation: CHF  Clinical Summary Diane Cohen is a 68 y.o.female with known history of Atrial fibrillation, CHADS VASC score of 5, on rivaroxaban, CHF with preserved EF per echo in 2015, hypertension, hypothyroidism, and TIA, who presents to ER with complaints of one week of worsening DOE., NYHA Class III. She states that she missed one day of her medications about a week ago. Felt some chest congestion but ignored it. Does admit to eating salty foods to include pizza and Fritos over the last week. States that chronic LE worsened with progression up to her thighs.. On day of admission, she could not breath with minimal exertion (standing up to walk ok sitting), and presented to ER.   Pro-BNP 2063. Wt 183 lbs (wt 175 lbs in our office in July 2015), CXR demonstrating cardiomegaly with indistinct pulmonary vasculature, favoring pulmonary venous hypertension with borderline appearance for pulmonary edema.She was found to be hypertensive on admission with BP of 184/74.Elevated LFTS.  She was given lasix 80 mg IV X 1. She has diuresed 4 liters.   She is feeling much better and is without further symptoms. She denies chest pain, palpitations, bleeding, or dizziness.    Allergies  Allergen Reactions  . Levaquin [Levofloxacin] Other (See Comments)    Headache   . Sulfa Antibiotics Hives    Medications Scheduled Medications: . amiodarone  100 mg Oral Daily  . amLODipine  5 mg Oral Daily  . docusate sodium  100 mg Oral Daily  . escitalopram  20 mg Oral QHS  . estradiol  2 mg Oral QHS  . ferrous sulfate  325 mg Oral Q breakfast  . furosemide  40 mg Intravenous Q12H  . Influenza vac split quadrivalent PF   0.5 mL Intramuscular Tomorrow-1000  . levothyroxine  100 mcg Oral QAC breakfast  . lisinopril  20 mg Oral Daily  . magnesium oxide  400 mg Oral Daily  . meloxicam  15 mg Oral QHS  . metoprolol succinate  100 mg Oral Daily  . pantoprazole  40 mg Oral Daily  . rivaroxaban  20 mg Oral Q supper  . sodium chloride  3 mL Intravenous Q12H        PRN Medications: acetaminophen, albuterol, ondansetron (ZOFRAN) IV, ondansetron   Past Medical History  Diagnosis Date  . Atrial fibrillation   . Hypertension   . Hypothyroidism   . Pneumonia   . TIA (transient ischemic attack)     Past Surgical History  Procedure Laterality Date  . Abdominal hysterectomy    . Breast surgery    . Cholecystectomy      Family History  Problem Relation Age of Onset  . Hypertension    . Hypertension Father     Social History Diane Cohen reports that she has never smoked. She does not have any smokeless tobacco history on file. Diane Cohen reports that she does not drink alcohol.  Review of Systems Otherwise reviewed and negative except as outlined.  Physical Examination Blood pressure 142/50, pulse 62, temperature 98.6 F (37 C), temperature source Oral, resp. rate 18, height 5\' 1"  (1.549 m), weight 175 lb 4.8 oz (79.516 kg), SpO2 95.00%.  Intake/Output Summary (Last 24 hours) at 12/29/13 0834 Last data filed at 12/28/13 2300  Gross per  24 hour  Intake      0 ml  Output   4100 ml  Net  -4100 ml    Telemetry: NSR with right axis deviation, rate of 66 bpm.  GEN: Awake, resting, no acute distress.  HEENT: Conjunctiva and lids normal, oropharynx clear with moist mucosa. Neck: Supple, no elevated JVP or carotid bruits, no thyromegaly. Lungs: Clear to auscultation, nonlabored breathing at rest. Cardiac: Regular rate and rhythm, no S3 or significant systolic murmur, no pericardial rub. Abdomen: Soft, nontender, no hepatomegaly, bowel sounds present, no guarding or rebound. Extremities: No  pitting edema, distal pulses 2+. Skin: Warm and dry. Musculoskeletal: No kyphosis. Neuropsychiatric: Alert and oriented x3, affect grossly appropriate.  Prior Cardiac Testing/Procedures 1.Echo 04/01/2013 Left ventricle: The cavity size was normal. Systolic function was normal. The estimated ejection fraction was in the range of 60% to 65%. Wall motion was normal; there were no regional wall motion abnormalities. Features are consistent with a pseudonormal left ventricular filling pattern, with concomitant abnormal relaxation and increased filling pressure (grade 2 diastolic dysfunction). - Mitral valve: Moderate regurgitation. - Left atrium: The atrium was moderately dilated. - Right ventricle: The cavity size was mildly dilated. Wall thickness was normal. - Right atrium: The atrium was mildly dilated. - Pulmonary arteries: Systolic pressure was moderately increased. PA peak pressure: 40mm Hg (S).  2. Cardiac Monitor 30 day 11/18/2013 NSR rates in the 60's.   3. PFT 07/2013 Notes Recorded by Satira Sark, MD on 07/26/2013 at 10:24 AM Reviewed preliminary report. Diffusion capacity reduced, 58% predicted. May not be symptom provoking, but sometimes associated with amiodarone. We just decreased her dose of amiodarone to 100 mg daily. Will plan to stay on low dose, perhaps even discontinue if she has no further atrial fibrillation.   Lab Results  Basic Metabolic Panel:  Recent Labs Lab 12/28/13 1500 12/29/13 0520  NA 139 143  K 3.7 3.3*  CL 101 101  CO2 27 32  GLUCOSE 91 84  BUN 17 18  CREATININE 0.82 1.01  CALCIUM 9.5 9.3    Liver Function Tests:  Recent Labs Lab 12/28/13 1500 12/29/13 0520  AST 57* 38*  ALT 39* 30  ALKPHOS 162* 141*  BILITOT 0.6 0.8  PROT 7.8 6.9  ALBUMIN 3.6 3.3*    CBC:  Recent Labs Lab 12/28/13 1500  WBC 5.5  NEUTROABS 4.1  HGB 11.0*  HCT 34.3*  MCV 95.5  PLT 236    Cardiac Enzymes:  Recent Labs Lab 12/28/13 1500   TROPONINI <0.30    BNP: 2,063  Radiology: Dg Chest 2 View  12/28/2013   CLINICAL DATA:  Chest congestion for 1 week. Worsening shortness of breath. Hypertension.  EXAM: CHEST  2 VIEW  COMPARISON:  Multiple exams, including 07/26/2013  FINDINGS: Bilateral hilar prominence. Mild enlargement of the cardiopericardial silhouette. Atherosclerotic aortic arch. Small bilateral pleural effusions with suspected subsegmental atelectasis in the lung bases, left greater than right. Indistinct pulmonary vasculature. Thoracic spondylosis. Mild airway thickening centrally.  IMPRESSION: 1. Cardiomegaly with indistinct pulmonary vasculature favoring pulmonary venous hypertension. Borderline appearance for interstitial edema. 2. Airway thickening is present, suggesting bronchitis or reactive airways disease. 3. Small bilateral pleural effusions with bibasilar subsegmental atelectasis.   Electronically Signed   By: Sherryl Barters M.D.   On: 12/28/2013 15:58     ECG: NSR with Right axis deviation. Rate of 66 bpm   Impression and Recommendations  1.Acute on Chronic Diastolic CHF with associated shortness of breath: She has diuresed  4 liters since admission with resolution of symptoms. Will repeat echocardiogram for changes in LV fx. She states she has chronic LEE due to use of methotrexate and amlodipine, with significant improvement now after diureses. Recommend low sodium diet. Transition back to PO diuretic in am. Consider changing to torsemide for better bioavailability.    2. PAF: Currently in NSR with metoprolol and amiodarone, with good HR control. CHADS VASC score of 5, on Rivaroxaban. No overt bleeding or signs of anemia.   3.; Hypertension: She has not been taking amlodipine daily as directed. She tells Dr. Luan Pulling that she did not refill her Rx. Currently on lisinopril. Agree with restarting amlodipine.   4. TIA: No residual affects.   5. Hypothyroidism:  Check level.  6. Non-Compliance:  Medication and diet.   Signed: Phill Myron. Lawrence NP  12/29/2013, 8:34 AM Co-Sign MD Patient seen and discussed with NP Purcell Nails, I agree with her documentation above. 68 yo female hx of afib Toprol, amion and xarelto, chronic diastolic HF, HTN, hx of prior TIA admitted with DOE and acute on chronic diastolic heart failure. Likely exacerbating factor appears to be medication and dietary non-compliance.    07/2013 Echo LVEF >84%, diastolic function not described.  03/2013 echo LVEF 60-65%, grade II diastolic dysfunction, moderate MR, PASP 50 CXR cardiomegaly, borderline edema, small bilateral pleural effusions, airway thickening suggesting bronchitis Pro-BNP 2063, trop neg, K 3.7, Cr 0.82, Hgb 11, Plt 236 EKG SR, lead reversal. Will repeat  She is negative 4.1 liters yesterday, negative 3.7 liters since admission. Uptrend in Cr but still within normal limits. She received lasix 40mg  and 80mg  yesterday, currently on lasix 40mg  IV bid.By exam still appears mildly volume overloaded, anticipate she will be euvolemic by tomorrow and likely can be discharged on oral diuretics. Due to significant diuresis with 20% uptrend in Cr will cut her lasix back to 40mg  IV once today to decrease risk of AKI, she has already received AM dose, will d/c IV lasix. Please keep K and Mg at 2.I have written for KCl 40 mEq x 2 today and added on Mg lab to AM labs.     Zandra Abts MD

## 2013-12-29 NOTE — Progress Notes (Signed)
  Echocardiogram 2D Echocardiogram has been performed.  Bar[place], [place] 12/29/2013, 10:05 AM

## 2013-12-29 NOTE — Care Management Note (Unsigned)
    Page 1 of 1   12/29/2013     4:11:37 PM CARE MANAGEMENT NOTE 12/29/2013  Patient:  Diane Cohen, Diane Cohen   Account Number:  [id number]  Date Initiated:  12/29/2013  Documentation initiated by:  Vladimir Creeks  Subjective/Objective Assessment:   Admitted with  CHF. Pt is from home with spouse, is independent, still drives, and will return home at D/C.     Action/Plan:   Refuses HH. States she will see MD if needed. Does not have O2 at home.   Anticipated DC Date:  12/31/2013   Anticipated DC Plan:  [place]  CM consult      Choice offered to / List presented to:             Status of service:   Medicare Important Message given?   (If response is "NO", the following Medicare IM given date fields will be blank) Date Medicare IM given:   Medicare IM given by:   Date Additional Medicare IM given:   Additional Medicare IM given by:    Discharge Disposition:  HOME/SELF CARE  Per UR Regulation:  Reviewed for med. necessity/level of care/duration of stay  If discussed at [place] of Stay Meetings, dates discussed:    Comments:  12/29/13 [place] RN/CM

## 2013-12-29 NOTE — Progress Notes (Signed)
Subjective: She was admitted yesterday with heart failure. She is known to have diastolic dysfunction. Although she has listed amlodipine as one of her medications she tells me this morning that she has not been taking that because she ran out and simply did not refill the prescription. However she does say that she's taking all of her other medications. She says she feels better this morning. Cardiology consultation is underway  Objective: Vital signs in last 24 hours: Temp:  [97.6 F (36.4 C)-98.9 F (37.2 C)] 98.6 F (37 C) (10/23 0548) Pulse Rate:  [61-70] 62 (10/23 0548) Resp:  [18-22] 18 (10/23 0548) BP: (125-196)/(50-81) 142/50 mmHg (10/23 0548) SpO2:  [95 %-100 %] 95 % (10/23 0548) Weight:  [79.516 kg (175 lb 4.8 oz)-83.008 kg (183 lb)] 79.516 kg (175 lb 4.8 oz) (10/23 0548) Weight change:     Intake/Output from previous day: 10/22 0701 - 10/23 0700 In: -  Out: 4100 [Urine:4100]  PHYSICAL EXAM General appearance: alert, cooperative and mild distress Resp: rales bilaterally Cardio: irregularly irregular rhythm GI: soft, non-tender; bowel sounds normal; no masses,  no organomegaly Extremities: 2+ edema  Lab Results:  Results for orders placed during the hospital encounter of 12/28/13 (from the past 48 hour(s))  CBC WITH DIFFERENTIAL     Status: Abnormal   Collection Time    12/28/13  3:00 PM      Result Value Ref Range   WBC 5.5  4.0 - 10.5 K/uL   RBC 3.59 (*) 3.87 - 5.11 MIL/uL   Hemoglobin 11.0 (*) 12.0 - 15.0 g/dL   HCT 34.3 (*) 36.0 - 46.0 %   MCV 95.5  78.0 - 100.0 fL   MCH 30.6  26.0 - 34.0 pg   MCHC 32.1  30.0 - 36.0 g/dL   RDW 17.0 (*) 11.5 - 15.5 %   Platelets 236  150 - 400 K/uL   Neutrophils Relative % 73  43 - 77 %   Neutro Abs 4.1  1.7 - 7.7 K/uL   Lymphocytes Relative 19  12 - 46 %   Lymphs Abs 1.0  0.7 - 4.0 K/uL   Monocytes Relative 6  3 - 12 %   Monocytes Absolute 0.3  0.1 - 1.0 K/uL   Eosinophils Relative 2  0 - 5 %   Eosinophils Absolute  0.1  0.0 - 0.7 K/uL   Basophils Relative 0  0 - 1 %   Basophils Absolute 0.0  0.0 - 0.1 K/uL  COMPREHENSIVE METABOLIC PANEL     Status: Abnormal   Collection Time    12/28/13  3:00 PM      Result Value Ref Range   Sodium 139  137 - 147 mEq/L   Potassium 3.7  3.7 - 5.3 mEq/L   Chloride 101  96 - 112 mEq/L   CO2 27  19 - 32 mEq/L   Glucose, Bld 91  70 - 99 mg/dL   BUN 17  6 - 23 mg/dL   Creatinine, Ser 0.82  0.50 - 1.10 mg/dL   Calcium 9.5  8.4 - 10.5 mg/dL   Total Protein 7.8  6.0 - 8.3 g/dL   Albumin 3.6  3.5 - 5.2 g/dL   AST 57 (*) 0 - 37 U/L   ALT 39 (*) 0 - 35 U/L   Alkaline Phosphatase 162 (*) 39 - 117 U/L   Total Bilirubin 0.6  0.3 - 1.2 mg/dL   GFR calc non Af Amer 72 (*) >90 mL/min   GFR  calc Af Amer 83 (*) >90 mL/min   Comment: (NOTE)     The eGFR has been calculated using the CKD EPI equation.     This calculation has not been validated in all clinical situations.     eGFR's persistently <90 mL/min signify possible Chronic Kidney     Disease.   Anion gap 11  5 - 15  TROPONIN I     Status: None   Collection Time    12/28/13  3:00 PM      Result Value Ref Range   Troponin I <0.30  <0.30 ng/mL   Comment:            Due to the release kinetics of cTnI,     a negative result within the first hours     of the onset of symptoms does not rule out     myocardial infarction with certainty.     If myocardial infarction is still suspected,     repeat the test at appropriate intervals.  PRO B NATRIURETIC PEPTIDE     Status: Abnormal   Collection Time    12/28/13  3:00 PM      Result Value Ref Range   Pro B Natriuretic peptide (BNP) 2063.0 (*) 0 - 125 pg/mL  URINALYSIS, ROUTINE W REFLEX MICROSCOPIC     Status: Abnormal   Collection Time    12/28/13  5:06 PM      Result Value Ref Range   Color, Urine YELLOW  YELLOW   APPearance CLEAR  CLEAR   Specific Gravity, Urine <1.005 (*) 1.005 - 1.030   pH 5.5  5.0 - 8.0   Glucose, UA NEGATIVE  NEGATIVE mg/dL   Hgb urine  dipstick NEGATIVE  NEGATIVE   Bilirubin Urine NEGATIVE  NEGATIVE   Ketones, ur NEGATIVE  NEGATIVE mg/dL   Protein, ur NEGATIVE  NEGATIVE mg/dL   Urobilinogen, UA 0.2  0.0 - 1.0 mg/dL   Nitrite NEGATIVE  NEGATIVE   Leukocytes, UA NEGATIVE  NEGATIVE   Comment: MICROSCOPIC NOT DONE ON URINES WITH NEGATIVE PROTEIN, BLOOD, LEUKOCYTES, NITRITE, OR GLUCOSE <1000 mg/dL.  COMPREHENSIVE METABOLIC PANEL     Status: Abnormal   Collection Time    12/29/13  5:20 AM      Result Value Ref Range   Sodium 143  137 - 147 mEq/L   Potassium 3.3 (*) 3.7 - 5.3 mEq/L   Chloride 101  96 - 112 mEq/L   CO2 32  19 - 32 mEq/L   Glucose, Bld 84  70 - 99 mg/dL   BUN 18  6 - 23 mg/dL   Creatinine, Ser 1.01  0.50 - 1.10 mg/dL   Calcium 9.3  8.4 - 10.5 mg/dL   Total Protein 6.9  6.0 - 8.3 g/dL   Albumin 3.3 (*) 3.5 - 5.2 g/dL   AST 38 (*) 0 - 37 U/L   ALT 30  0 - 35 U/L   Alkaline Phosphatase 141 (*) 39 - 117 U/L   Total Bilirubin 0.8  0.3 - 1.2 mg/dL   GFR calc non Af Amer 56 (*) >90 mL/min   GFR calc Af Amer 65 (*) >90 mL/min   Comment: (NOTE)     The eGFR has been calculated using the CKD EPI equation.     This calculation has not been validated in all clinical situations.     eGFR's persistently <90 mL/min signify possible Chronic Kidney     Disease.   Anion gap 10  5 - 15  PRO B NATRIURETIC PEPTIDE     Status: Abnormal   Collection Time    12/29/13  5:20 AM      Result Value Ref Range   Pro B Natriuretic peptide (BNP) 1859.0 (*) 0 - 125 pg/mL    ABGS No results found for this basename: PHART, PCO2, PO2ART, TCO2, HCO3,  in the last 72 hours CULTURES No results found for this or any previous visit (from the past 240 hour(s)). Studies/Results: Dg Chest 2 View  12/28/2013   CLINICAL DATA:  Chest congestion for 1 week. Worsening shortness of breath. Hypertension.  EXAM: CHEST  2 VIEW  COMPARISON:  Multiple exams, including 07/26/2013  FINDINGS: Bilateral hilar prominence. Mild enlargement of the  cardiopericardial silhouette. Atherosclerotic aortic arch. Small bilateral pleural effusions with suspected subsegmental atelectasis in the lung bases, left greater than right. Indistinct pulmonary vasculature. Thoracic spondylosis. Mild airway thickening centrally.  IMPRESSION: 1. Cardiomegaly with indistinct pulmonary vasculature favoring pulmonary venous hypertension. Borderline appearance for interstitial edema. 2. Airway thickening is present, suggesting bronchitis or reactive airways disease. 3. Small bilateral pleural effusions with bibasilar subsegmental atelectasis.   Electronically Signed   By: Sherryl Barters M.D.   On: 12/28/2013 15:58    Medications:  Prior to Admission:  Prescriptions prior to admission  Medication Sig Dispense Refill  . acetaminophen (TYLENOL) 500 MG tablet Take 1,000 mg by mouth every 6 (six) hours as needed.      Marland Kitchen albuterol (PROVENTIL HFA;VENTOLIN HFA) 108 (90 BASE) MCG/ACT inhaler Inhale 1-2 puffs into the lungs every 6 (six) hours as needed for wheezing or shortness of breath.      Marland Kitchen amiodarone (PACERONE) 200 MG tablet Take 0.5 tablets (100 mg total) by mouth daily.  45 tablet  3  . amLODipine (NORVASC) 5 MG tablet Take 5 mg by mouth daily.      . Calcium Carbonate-Vit D-Min (CALTRATE PLUS PO) Take by mouth 2 (two) times daily.      Marland Kitchen docusate sodium (COLACE) 100 MG capsule Take 1 capsule (100 mg total) by mouth daily.  90 capsule  3  . escitalopram (LEXAPRO) 20 MG tablet Take 20 mg by mouth at bedtime.      Marland Kitchen estradiol (ESTRACE) 2 MG tablet Take 2 mg by mouth at bedtime.      . ferrous sulfate 325 (65 FE) MG tablet Take 1 tablet (325 mg total) by mouth daily with breakfast.  90 tablet  3  . furosemide (LASIX) 40 MG tablet Take 40 mg by mouth daily.      Marland Kitchen levothyroxine (SYNTHROID, LEVOTHROID) 100 MCG tablet Take 100 mcg by mouth daily before breakfast.      . lisinopril (PRINIVIL,ZESTRIL) 20 MG tablet Take 1 tablet (20 mg total) by mouth daily.      .  magnesium oxide (MAG-OX) 400 MG tablet Take 400 mg by mouth daily.      . meloxicam (MOBIC) 15 MG tablet Take 15 mg by mouth at bedtime.      . metoprolol succinate (TOPROL-XL) 100 MG 24 hr tablet Take 100 mg by mouth daily. Take with or immediately following a meal.      . Multiple Vitamin (MULTIVITAMIN) tablet Take 1 tablet by mouth daily.      Marland Kitchen omeprazole (PRILOSEC) 40 MG capsule Take 40 mg by mouth daily.      . rivaroxaban (XARELTO) 20 MG TABS tablet Take 1 tablet (20 mg total) by mouth daily with supper.  30 tablet  6  . rizatriptan (MAXALT-MLT) 10 MG disintegrating tablet Take 10 mg by mouth as needed for migraine. May repeat in 2 hours if needed       Scheduled: . amiodarone  100 mg Oral Daily  . amLODipine  5 mg Oral Daily  . docusate sodium  100 mg Oral Daily  . escitalopram  20 mg Oral QHS  . estradiol  2 mg Oral QHS  . ferrous sulfate  325 mg Oral Q breakfast  . furosemide  40 mg Intravenous Q12H  . Influenza vac split quadrivalent PF  0.5 mL Intramuscular Tomorrow-1000  . levothyroxine  100 mcg Oral QAC breakfast  . lisinopril  20 mg Oral Daily  . magnesium oxide  400 mg Oral Daily  . meloxicam  15 mg Oral QHS  . metoprolol succinate  100 mg Oral Daily  . pantoprazole  40 mg Oral Daily  . rivaroxaban  20 mg Oral Q supper  . sodium chloride  3 mL Intravenous Q12H   Continuous:  RJF:[id number], albuterol, ondansetron (ZOFRAN) IV, ondansetron  Assesment: She was admitted with diastolic congestive heart failure. She is known to have chronic atrial fibrillation and is chronically anticoagulated and on amiodarone for that. She has hypertension which is apparently been pretty well controlled but she has not been taking amlodipine. Active Problems:   Essential hypertension, benign   Atrial fibrillation   CHF (congestive heart failure)    Plan: Continue current treatments. Cardiology consultation.    LOS: 1 day   Faron Tudisco L 12/29/2013, 8:49 AM

## 2013-12-29 NOTE — Care Management Utilization Note (Signed)
UR completed 

## 2013-12-30 MED ORDER — POTASSIUM CHLORIDE ER 20 MEQ PO TBCR: 20.0000 meq | EXTENDED_RELEASE_TABLET | Freq: Every day | ORAL | Status: DC

## 2013-12-30 NOTE — Progress Notes (Signed)
Patient discharged with instructions, prescription, and care notes.  Verbalized understanding via teach back.  IV was removed and the site was WNL. Patient voiced no further complaints or concerns at the time of discharge.  Appointments scheduled per instructions.  Patient left the floor via w/c with staff and family in stable condition. 

## 2013-12-30 NOTE — Discharge Summary (Signed)
Physician Discharge Summary  Patient ID: Diane Cohen MRN: [id number] DOB/AGE: 1945/08/18 68 y.o. Primary Care Physician:HAWKINS,EDWARD L, MD Admit date: 12/28/2013 Discharge date: 12/30/2013    Discharge Diagnoses:  1. Acute on chronic diastolic congestive heart failure, resolved. 2.Hypertension. 3. Atrial fibrillation on chronic anticoagulation therapy.     Medication List         acetaminophen 500 MG tablet  Commonly known as:  TYLENOL  Take 1,000 mg by mouth every 6 (six) hours as needed.     albuterol 108 (90 BASE) MCG/ACT inhaler  Commonly known as:  PROVENTIL HFA;VENTOLIN HFA  Inhale 1-2 puffs into the lungs every 6 (six) hours as needed for wheezing or shortness of breath.     amiodarone 200 MG tablet  Commonly known as:  PACERONE  Take 0.5 tablets (100 mg total) by mouth daily.     amLODipine 5 MG tablet  Commonly known as:  NORVASC  Take 5 mg by mouth daily.     CALTRATE PLUS PO  Take by mouth 2 (two) times daily.     docusate sodium 100 MG capsule  Commonly known as:  COLACE  Take 1 capsule (100 mg total) by mouth daily.     escitalopram 20 MG tablet  Commonly known as:  LEXAPRO  Take 20 mg by mouth at bedtime.     estradiol 2 MG tablet  Commonly known as:  ESTRACE  Take 2 mg by mouth at bedtime.     ferrous sulfate 325 (65 FE) MG tablet  Take 1 tablet (325 mg total) by mouth daily with breakfast.     furosemide 40 MG tablet  Commonly known as:  LASIX  Take 40 mg by mouth daily.     levothyroxine 100 MCG tablet  Commonly known as:  SYNTHROID, LEVOTHROID  Take 100 mcg by mouth daily before breakfast.     lisinopril 20 MG tablet  Commonly known as:  PRINIVIL,ZESTRIL  Take 1 tablet (20 mg total) by mouth daily.     magnesium oxide 400 MG tablet  Commonly known as:  MAG-OX  Take 400 mg by mouth daily.     meloxicam 15 MG tablet  Commonly known as:  MOBIC  Take 15 mg by mouth at bedtime.     metoprolol succinate 100 MG 24 hr tablet   Commonly known as:  TOPROL-XL  Take 100 mg by mouth daily. Take with or immediately following a meal.     multivitamin tablet  Take 1 tablet by mouth daily.     omeprazole 40 MG capsule  Commonly known as:  PRILOSEC  Take 40 mg by mouth daily.     Potassium Chloride ER 20 MEQ Tbcr  Take 20 mEq by mouth daily.     rivaroxaban 20 MG Tabs tablet  Commonly known as:  XARELTO  Take 1 tablet (20 mg total) by mouth daily with supper.     rizatriptan 10 MG disintegrating tablet  Commonly known as:  MAXALT-MLT  Take 10 mg by mouth as needed for migraine. May repeat in 2 hours if needed        Discharged Condition: Stable and improved.    Consults: Cardiology, Dr. Harl Bowie.  Significant Diagnostic Studies: Dg Chest 2 View  12/28/2013   CLINICAL DATA:  Chest congestion for 1 week. Worsening shortness of breath. Hypertension.  EXAM: CHEST  2 VIEW  COMPARISON:  Multiple exams, including 07/26/2013  FINDINGS: Bilateral hilar prominence. Mild enlargement of the cardiopericardial silhouette. Atherosclerotic aortic arch.  Small bilateral pleural effusions with suspected subsegmental atelectasis in the lung bases, left greater than right. Indistinct pulmonary vasculature. Thoracic spondylosis. Mild airway thickening centrally.  IMPRESSION: 1. Cardiomegaly with indistinct pulmonary vasculature favoring pulmonary venous hypertension. Borderline appearance for interstitial edema. 2. Airway thickening is present, suggesting bronchitis or reactive airways disease. 3. Small bilateral pleural effusions with bibasilar subsegmental atelectasis.   Electronically Signed   By: Sherryl Barters M.D.   On: 12/28/2013 15:58    Lab Results: Basic Metabolic Panel:  Recent Labs  12/28/13 1500 12/29/13 0502 12/29/13 0520  NA 139  --  143  K 3.7  --  3.3*  CL 101  --  101  CO2 27  --  32  GLUCOSE 91  --  84  BUN 17  --  18  CREATININE 0.82  --  1.01  CALCIUM 9.5  --  9.3  MG  --  2.1  --    Liver  Function Tests:  Recent Labs  12/28/13 1500 12/29/13 0520  AST 57* 38*  ALT 39* 30  ALKPHOS 162* 141*  BILITOT 0.6 0.8  PROT 7.8 6.9  ALBUMIN 3.6 3.3*     CBC:  Recent Labs  12/28/13 1500  WBC 5.5  NEUTROABS 4.1  HGB 11.0*  HCT 34.3*  MCV 95.5  PLT 236    No results found for this or any previous visit (from the past 240 hour(s)).   Hospital Course: This very pleasant 68 year old lady was admitted to the hospital with symptoms of dyspnea and leg swelling. Please see initial history as outlined below: HPI: Diane Cohen is a 68 y.o. female  This is a 68 year old lady who has a previous history of diastolic dysfunction, atrial fibrillation and now presents with approximately one week history of dyspnea on exertion. She denies any orthopnea or PND. She is also had some lower leg/ankle swelling. She does also take amlodipine and meloxicam. She has been compliant with her medications including Lasix daily. She denies any chest pain. Evaluation in the emergency room is consistent with congestive heart failure. She is now being admitted for further management. The patient was admitted and started on intravenous diuretics. She is managed to lose weight and have had a good diuresis. Her symptoms are improved. Echocardiogram revealed ejection fraction of 64-33% with diastolic dysfunction. She is now keen to go home. Discharge Exam: Blood pressure 126/58, pulse 58, temperature 98 F (36.7 C), temperature source Oral, resp. rate 18, height 5\' 1"  (1.549 m), weight 79.017 kg (174 lb 3.2 oz), SpO2 94.00%. She looks systemically well. She has lost approximately 9 pounds during this hospitalization. Lung fields are clear without any crackles. There is no peripheral pitting edema any more. She is alert and orientated.  Disposition: Home. She will follow with her primary care physician and cardiology.      Discharge Instructions   Diet - low sodium heart healthy    Complete by:  As directed       Increase activity slowly    Complete by:  As directed            Follow-up Information   Follow up with HAWKINS,EDWARD L, MD. Call in 1 week.   Specialty:  Pulmonary Disease   Contact information:   [address] [place] [address] [phone]       Follow up with Carlyle Dolly, F, MD. Call in 2 weeks.   Specialty:  Cardiology   Contact information:   [address] [place] [address] [phone]       Signed: Doree Albee   12/30/2013, 8:52 AM

## 2014-01-09 ENCOUNTER — Other Ambulatory Visit (HOSPITAL_COMMUNITY): Payer: Self-pay | Admitting: Pulmonary Disease

## 2014-01-09 ENCOUNTER — Ambulatory Visit (HOSPITAL_COMMUNITY)
Admission: RE | Admit: 2014-01-09 | Discharge: 2014-01-09 | Disposition: A | Discharge status: Home / Self Care | Payer: Medicare PPO | Source: Ambulatory Visit | Attending: Pulmonary Disease | Admitting: Pulmonary Disease

## 2014-01-09 DIAGNOSIS — M25572 Pain in left ankle and joints of left foot: Secondary | ICD-10-CM | POA: Diagnosis present

## 2014-01-09 DIAGNOSIS — M25472 Effusion, left ankle: Secondary | ICD-10-CM | POA: Diagnosis not present

## 2014-01-23 ENCOUNTER — Encounter: Payer: Self-pay | Admitting: Cardiology

## 2014-01-23 ENCOUNTER — Ambulatory Visit (INDEPENDENT_AMBULATORY_CARE_PROVIDER_SITE_OTHER): Payer: Medicare PPO | Admitting: Cardiology

## 2014-01-23 VITALS — BP 130/80 | HR 79 | Ht 61.0 in | Wt 174.0 lb

## 2014-01-23 DIAGNOSIS — I480 Paroxysmal atrial fibrillation: Secondary | ICD-10-CM

## 2014-01-23 DIAGNOSIS — I5032 Chronic diastolic (congestive) heart failure: Secondary | ICD-10-CM

## 2014-01-23 MED ORDER — FUROSEMIDE 40 MG PO TABS: ORAL_TABLET | ORAL | Status: DC

## 2014-01-23 NOTE — Patient Instructions (Signed)
Your physician recommends that you schedule a follow-up appointment in: 4 months     Your physician recommends that you continue on your current medications as directed. Please refer to the Current Medication list given to you today.      Thank you for choosing [facility] !         

## 2014-01-23 NOTE — Assessment & Plan Note (Signed)
Paroxysmal, well controlled on amiodarone and Xarelto.

## 2014-01-23 NOTE — Progress Notes (Signed)
Reason for visit: Atrial fibrillation, hospital follow-up  Clinical Summary Diane Cohen is a 68 y.o.female last seen in the office back in July. Interval records reviewed finding hospitalization in October with diastolic heart failure manifested as increased shortness of breath and leg edema. She was seen by Dr. Harl Bowie in consultation. She diuresed well on IV Lasix. Lab work near discharge showed BUN 18 with creatinine 1.0.  She continues on Lasix 40 mg daily, has not had to take any extra doses since hospital discharge. Her weight is stable around 174 pounds. She reports no palpitations. We reviewed her cardiac medications and provided a prescription for Lasix so that she has extra pills in case she needed them.  She continues on Xarelto and amiodarone with good control of atrial fibrillation.  Echocardiogram from October of this year showed moderate LVH with LVEF 60-65%, indeterminate diastolic function, sclerotic aortic valve without stenosis, mild to moderate mitral regurgitation, moderate left atrial enlargement, moderately dilated RV with normal contraction, moderate right atrial enlargement, and PASP 48 mmHg.  Allergies  Allergen Reactions  . Levaquin [Levofloxacin] Other (See Comments)    Headache   . Sulfa Antibiotics Hives    Current Outpatient Prescriptions  Medication Sig Dispense Refill  . acetaminophen (TYLENOL) 500 MG tablet Take 1,000 mg by mouth every 6 (six) hours as needed.    Marland Kitchen albuterol (PROVENTIL HFA;VENTOLIN HFA) 108 (90 BASE) MCG/ACT inhaler Inhale 1-2 puffs into the lungs every 6 (six) hours as needed for wheezing or shortness of breath.    Marland Kitchen amiodarone (PACERONE) 200 MG tablet Take 0.5 tablets (100 mg total) by mouth daily. 45 tablet 3  . amLODipine (NORVASC) 5 MG tablet Take 5 mg by mouth daily.    . Calcium Carbonate-Vit D-Min (CALTRATE PLUS PO) Take by mouth 2 (two) times daily.    Marland Kitchen docusate sodium (COLACE) 100 MG capsule Take 1 capsule (100 mg total) by  mouth daily. 90 capsule 3  . escitalopram (LEXAPRO) 20 MG tablet Take 20 mg by mouth at bedtime.    Marland Kitchen estradiol (ESTRACE) 2 MG tablet Take 2 mg by mouth at bedtime.    . furosemide (LASIX) 40 MG tablet Take 1 tablet daily,may take extra tab for leg swelling or wt gain over 3 lbs in 24 hrs 45 tablet 6  . levothyroxine (SYNTHROID, LEVOTHROID) 100 MCG tablet Take 100 mcg by mouth daily before breakfast.    . lisinopril (PRINIVIL,ZESTRIL) 20 MG tablet Take 1 tablet (20 mg total) by mouth daily.    . magnesium oxide (MAG-OX) 400 MG tablet Take 400 mg by mouth daily.    . meloxicam (MOBIC) 15 MG tablet Take 15 mg by mouth at bedtime.    . metoprolol succinate (TOPROL-XL) 100 MG 24 hr tablet Take 100 mg by mouth daily. Take with or immediately following a meal.    . Multiple Vitamin (MULTIVITAMIN) tablet Take 1 tablet by mouth daily.    Marland Kitchen omeprazole (PRILOSEC) 40 MG capsule Take 40 mg by mouth daily.    . potassium chloride 20 MEQ TBCR Take 20 mEq by mouth daily. 30 tablet 0  . rivaroxaban (XARELTO) 20 MG TABS tablet Take 1 tablet (20 mg total) by mouth daily with supper. 30 tablet 6  . rizatriptan (MAXALT-MLT) 10 MG disintegrating tablet Take 10 mg by mouth as needed for migraine. May repeat in 2 hours if needed     No current facility-administered medications for this visit.    Past Medical History  Diagnosis Date  .  Atrial fibrillation   . Hypertension   . Hypothyroidism   . Pneumonia   . TIA (transient ischemic attack)   . Chronic diastolic heart failure     Social History Diane Cohen reports that she quit smoking about 41 years ago. Her smoking use included Cigarettes. She smoked 0.50 packs per day. She has never used smokeless tobacco. Diane Cohen reports that she does not drink alcohol.  Review of Systems Complete review of systems negative except as otherwise outlined in the clinical summary and also the following. No bleeding episodes. Stable appetite. No orthopnea or  PND.  Physical Examination Filed Vitals:   01/23/14 0912  BP: 130/80  Pulse: 79   Filed Weights   01/23/14 0912  Weight: 174 lb (78.926 kg)    Overweight woman, appears comfortable.  HEENT: Conjunctiva and lids normal, oropharynx clear.  Neck: Supple, no elevated JVP or carotid bruits, no thyromegaly.  Lungs: Course but clear to auscultation, nonlabored breathing at rest.  Cardiac: Regular rate and rhythm, 2/6 systolic murmur at the base, no pericardial rub.  Abdomen: Soft, nontender, bowel sounds present, no guarding or rebound.  Extremities: Trace edema below the knees, distal pulses 2+.  Skin: Warm and dry.  Musculoskeletal: No kyphosis.  Neuropsychiatric: Alert and oriented x3, affect grossly appropriate.   Problem List and Plan   Chronic diastolic heart failure Clinically stable at this time. Refill provided for Lasix. We discussed doubling her dose intermittently for progressive leg edema or weight gain of 3 pounds in 24 hours. Sodium restriction also discussed. Follow-up arranged.  Atrial fibrillation Paroxysmal, well controlled on amiodarone and Xarelto.    Satira Sark, M.D., F.A.C.C.

## 2014-01-23 NOTE — Assessment & Plan Note (Signed)
Clinically stable at this time. Refill provided for Lasix. We discussed doubling her dose intermittently for progressive leg edema or weight gain of 3 pounds in 24 hours. Sodium restriction also discussed. Follow-up arranged.

## 2014-02-09 ENCOUNTER — Ambulatory Visit (INDEPENDENT_AMBULATORY_CARE_PROVIDER_SITE_OTHER): Payer: Medicare PPO | Admitting: Obstetrics & Gynecology

## 2014-02-09 ENCOUNTER — Encounter: Payer: Self-pay | Admitting: Obstetrics & Gynecology

## 2014-02-09 VITALS — BP 170/90 | Ht 61.0 in | Wt 177.0 lb

## 2014-02-09 DIAGNOSIS — Z1212 Encounter for screening for malignant neoplasm of rectum: Secondary | ICD-10-CM

## 2014-02-09 DIAGNOSIS — Z01419 Encounter for gynecological examination (general) (routine) without abnormal findings: Secondary | ICD-10-CM

## 2014-02-09 DIAGNOSIS — Z1211 Encounter for screening for malignant neoplasm of colon: Secondary | ICD-10-CM

## 2014-02-09 MED ORDER — FLUCONAZOLE 150 MG PO TABS: 150.0000 mg | ORAL_TABLET | Freq: Once | ORAL | Status: DC

## 2014-02-09 MED ORDER — ESTRADIOL 1 MG PO TABS: 1.0000 mg | ORAL_TABLET | Freq: Every day | ORAL | Status: DC

## 2014-02-09 NOTE — Progress Notes (Signed)
Patient ID: Diane Cohen, female   DOB: 02-26-1946, 68 y.o.   MRN: [id number] Subjective:     Diane Cohen is a 68 y.o. female here for a routine exam.  No LMP recorded. Patient has had a hysterectomy. No obstetric history on file. Birth Control Method:  Hysterectomy  Menstrual Calendar(currently): hysterectomy  Current complaints: none.   Current acute medical issues:  CHF   Recent Gynecologic History No LMP recorded. Patient has had a hysterectomy. Last Pap: ,   Last mammogram: 2013,  normal  Past Medical History  Diagnosis Date  . Atrial fibrillation   . Hypertension   . Hypothyroidism   . Pneumonia   . TIA (transient ischemic attack)   . Chronic diastolic heart failure     Past Surgical History  Procedure Laterality Date  . Abdominal hysterectomy    . Breast surgery    . Cholecystectomy    . Coloscopy      OB History    No data available      History   Social History  . Marital Status: Married    Spouse Name: N/A    Number of Children: N/A  . Years of Education: N/A   Social History Main Topics  . Smoking status: Former Smoker -- 0.50 packs/day    Types: Cigarettes    Quit date: 01/22/1973  . Smokeless tobacco: Never Used  . Alcohol Use: No  . Drug Use: No  . Sexual Activity: Yes    Birth Control/ Protection: Surgical   Other Topics Concern  . None   Social History Narrative    Family History  Problem Relation Age of Onset  . Hypertension    . Hypertension Father     Current outpatient prescriptions: acetaminophen (TYLENOL) 500 MG tablet, Take 1,000 mg by mouth every 6 (six) hours as needed., Disp: , Rfl: ;  albuterol (PROVENTIL HFA;VENTOLIN HFA) 108 (90 BASE) MCG/ACT inhaler, Inhale 1-2 puffs into the lungs every 6 (six) hours as needed for wheezing or shortness of breath., Disp: , Rfl: ;  amiodarone (PACERONE) 200 MG tablet, Take 0.5 tablets (100 mg total) by mouth daily., Disp: 45 tablet, Rfl: 3 amLODipine (NORVASC) 5 MG tablet, Take 5 mg by  mouth daily., Disp: , Rfl: ;  Calcium Carbonate-Vit D-Min (CALTRATE PLUS PO), Take by mouth 2 (two) times daily., Disp: , Rfl: ;  docusate sodium (COLACE) 100 MG capsule, Take 1 capsule (100 mg total) by mouth daily., Disp: 90 capsule, Rfl: 3;  escitalopram (LEXAPRO) 20 MG tablet, Take 20 mg by mouth at bedtime., Disp: , Rfl:  estradiol (ESTRACE) 2 MG tablet, Take 2 mg by mouth at bedtime., Disp: , Rfl: ;  furosemide (LASIX) 40 MG tablet, Take 1 tablet daily,may take extra tab for leg swelling or wt gain over 3 lbs in 24 hrs, Disp: 45 tablet, Rfl: 6;  levothyroxine (SYNTHROID, LEVOTHROID) 100 MCG tablet, Take 100 mcg by mouth daily before breakfast., Disp: , Rfl: ;  lisinopril (PRINIVIL,ZESTRIL) 20 MG tablet, Take 1 tablet (20 mg total) by mouth daily., Disp: , Rfl:  magnesium oxide (MAG-OX) 400 MG tablet, Take 400 mg by mouth daily., Disp: , Rfl: ;  meloxicam (MOBIC) 15 MG tablet, Take 15 mg by mouth at bedtime., Disp: , Rfl: ;  metoprolol succinate (TOPROL-XL) 100 MG 24 hr tablet, Take 100 mg by mouth daily. Take with or immediately following a meal., Disp: , Rfl: ;  Multiple Vitamin (MULTIVITAMIN) tablet, Take 1 tablet by mouth daily., Disp: ,  Rfl:  omeprazole (PRILOSEC) 40 MG capsule, Take 40 mg by mouth daily., Disp: , Rfl: ;  potassium chloride 20 MEQ TBCR, Take 20 mEq by mouth daily., Disp: 30 tablet, Rfl: 0;  rivaroxaban (XARELTO) 20 MG TABS tablet, Take 1 tablet (20 mg total) by mouth daily with supper., Disp: 30 tablet, Rfl: 6;  rizatriptan (MAXALT-MLT) 10 MG disintegrating tablet, Take 10 mg by mouth as needed for migraine. May repeat in 2 hours if needed, Disp: , Rfl:   Review of Systems  Review of Systems  Constitutional: Negative for fever, chills, weight loss, malaise/fatigue and diaphoresis.  HENT: Negative for hearing loss, ear pain, nosebleeds, congestion, sore throat, neck pain, tinnitus and ear discharge.   Eyes: Negative for blurred vision, double vision, photophobia, pain, discharge  and redness.  Respiratory: Negative for cough, hemoptysis, sputum production, shortness of breath, wheezing and stridor.   Cardiovascular: Negative for chest pain, palpitations, orthopnea, claudication, leg swelling and PND.  Gastrointestinal: negative for abdominal pain. Negative for heartburn, nausea, vomiting, diarrhea, constipation, blood in stool and melena.  Genitourinary: Negative for dysuria, urgency, frequency, hematuria and flank pain.  Musculoskeletal: Negative for myalgias, back pain, joint pain and falls.  Skin: Negative for itching and rash.  Neurological: Negative for dizziness, tingling, tremors, sensory change, speech change, focal weakness, seizures, loss of consciousness, weakness and headaches.  Endo/Heme/Allergies: Negative for environmental allergies and polydipsia. Does not bruise/bleed easily.  Psychiatric/Behavioral: Negative for depression, suicidal ideas, hallucinations, memory loss and substance abuse. The patient is not nervous/anxious and does not have insomnia.        Objective:  Blood pressure 170/90, height 5\' 1"  (1.549 m), weight 177 lb (80.287 kg).   Physical Exam  Vitals reviewed. Constitutional: She is oriented to person, place, and time. She appears well-developed and well-nourished.  HENT:  Head: Normocephalic and atraumatic.        Right Ear: External ear normal.  Left Ear: External ear normal.  Nose: Nose normal.  Mouth/Throat: Oropharynx is clear and moist.  Eyes: Conjunctivae and EOM are normal. Pupils are equal, round, and reactive to light. Right eye exhibits no discharge. Left eye exhibits no discharge. No scleral icterus.  Neck: Normal range of motion. Neck supple. No tracheal deviation present. No thyromegaly present.  Cardiovascular: Normal rate, regular rhythm, normal heart sounds and intact distal pulses.  Exam reveals no gallop and no friction rub.   No murmur heard. Respiratory: Effort normal and breath sounds normal. No respiratory  distress. She has no wheezes. She has no rales. She exhibits no tenderness.  GI: Soft. Bowel sounds are normal. She exhibits no distension and no mass. There is no tenderness. There is no rebound and no guarding.  Genitourinary:  Breasts no masses skin changes or nipple changes bilaterally      Vulva is normal without lesions Vagina is pink moist +yeast Cervix absent Uterus is absent Adnexa is absent Rectal    hemoccult negative, normal tone, no masses  Musculoskeletal: Normal range of motion. She exhibits no edema and no tenderness.  Neurological: She is alert and oriented to person, place, and time. She has normal reflexes. She displays normal reflexes. No cranial nerve deficit. She exhibits normal muscle tone. Coordination normal.  Skin: Skin is warm and dry. No rash noted. No erythema. No pallor.  Psychiatric: She has a normal mood and affect. Her behavior is normal. Judgment and thought content normal.       Assessment:      +yeast otherwise  Normal  gyn exam .    Plan:    Hormone replacement therapy: hormone replacement therapy: decrease to estrace 1mg . Follow up in: 1 year.

## 2014-02-12 ENCOUNTER — Telehealth: Payer: Self-pay | Admitting: *Deleted

## 2014-02-12 ENCOUNTER — Telehealth: Payer: Self-pay | Admitting: Obstetrics & Gynecology

## 2014-02-12 NOTE — Telephone Encounter (Signed)
Pt states saw Dr. Elonda Husky on 02/09/2014 thought he wanted her to continue to take Estrace 2 mg and then drop down to 1 mg however the RX was to Estrace 1 mg. Please advise.

## 2014-02-12 NOTE — Telephone Encounter (Signed)
Yes she can take it.

## 2014-02-12 NOTE — Telephone Encounter (Signed)
Diane Cohen from [employer], [place], state presciption given for Diflucan on Friday was wanting to make sure Dr. Elonda Cohen was aware pt was also taking Amiodorane 100 mg daily and confirm Dr. Elonda Cohen wants pt to take Diflucan due to heart palpitation.

## 2014-02-13 NOTE — Telephone Encounter (Signed)
Nicki Reaper, Pharmacist with [facility] aware Dr. Elonda Husky wants pt to take the Diflucan. Also pt notified of RX for Diflucan approved and pt to take the Estrace 1 mg daily.

## 2014-04-09 ENCOUNTER — Other Ambulatory Visit: Payer: Self-pay | Admitting: Cardiology

## 2014-05-31 ENCOUNTER — Ambulatory Visit (INDEPENDENT_AMBULATORY_CARE_PROVIDER_SITE_OTHER): Payer: Medicare PPO | Admitting: Cardiology

## 2014-05-31 ENCOUNTER — Other Ambulatory Visit: Payer: Self-pay | Admitting: Cardiology

## 2014-05-31 ENCOUNTER — Encounter: Payer: Self-pay | Admitting: Cardiology

## 2014-05-31 VITALS — BP 122/58 | HR 70 | Ht 61.0 in | Wt 180.0 lb

## 2014-05-31 DIAGNOSIS — I480 Paroxysmal atrial fibrillation: Secondary | ICD-10-CM | POA: Diagnosis not present

## 2014-05-31 DIAGNOSIS — Z79899 Other long term (current) drug therapy: Secondary | ICD-10-CM

## 2014-05-31 DIAGNOSIS — I10 Essential (primary) hypertension: Secondary | ICD-10-CM | POA: Diagnosis not present

## 2014-05-31 DIAGNOSIS — I5032 Chronic diastolic (congestive) heart failure: Secondary | ICD-10-CM | POA: Diagnosis not present

## 2014-05-31 NOTE — Progress Notes (Signed)
Cardiology Office Note  Date: 05/31/2014   ID: Malayja, Freund 02-02-1946, MRN [id number]  PCP: Alonza Bogus, MD  Primary Cardiologist: Rozann Lesches, MD   Chief Complaint  Patient presents with  . Atrial Fibrillation    History of Present Illness: Diane Cohen is a 69 y.o. female last seen in November 2015. She presents for a routine visit. We reviewed her medications, no changes noted from a cardiac perspective. She describes only a brief sense of palpitations occasionally, heart rate is regular today. She has had some recent nosebleeds, but no other spontaneous bleeding problems on Xarelto.  Lab work from October is noted below. She is due for follow-up evaluation, on amiodarone and Xarelto.  She also tells me that she has had some problems with arthritis and swelling in her left ankle, has had steroid injections per a provider in [place].   Past Medical History  Diagnosis Date  . Atrial fibrillation   . Hypertension   . Hypothyroidism   . Pneumonia   . TIA (transient ischemic attack)   . Chronic diastolic heart failure     Past Surgical History  Procedure Laterality Date  . Abdominal hysterectomy    . Breast surgery    . Cholecystectomy    . Coloscopy      Current Outpatient Prescriptions  Medication Sig Dispense Refill  . acetaminophen (TYLENOL) 500 MG tablet Take 1,000 mg by mouth every 6 (six) hours as needed.    Marland Kitchen albuterol (PROVENTIL HFA;VENTOLIN HFA) 108 (90 BASE) MCG/ACT inhaler Inhale 1-2 puffs into the lungs every 6 (six) hours as needed for wheezing or shortness of breath.    Marland Kitchen amiodarone (PACERONE) 200 MG tablet Take 0.5 tablets (100 mg total) by mouth daily. 45 tablet 3  . amLODipine (NORVASC) 5 MG tablet Take 5 mg by mouth daily.    . Calcium Carbonate-Vit D-Min (CALTRATE PLUS PO) Take by mouth 2 (two) times daily.    Marland Kitchen docusate sodium (COLACE) 100 MG capsule Take 1 capsule (100 mg total) by mouth daily. 90 capsule 3  . escitalopram  (LEXAPRO) 20 MG tablet Take 20 mg by mouth at bedtime.    Marland Kitchen estradiol (ESTRACE) 1 MG tablet Take 1 tablet (1 mg total) by mouth at bedtime. 30 tablet 11  . fluconazole (DIFLUCAN) 150 MG tablet Take 1 tablet (150 mg total) by mouth once. Take the second tablet 3 days after the first one. 2 tablet 0  . furosemide (LASIX) 40 MG tablet Take 1 tablet daily,may take extra tab for leg swelling or wt gain over 3 lbs in 24 hrs 45 tablet 6  . levothyroxine (SYNTHROID, LEVOTHROID) 100 MCG tablet Take 100 mcg by mouth daily before breakfast.    . lisinopril (PRINIVIL,ZESTRIL) 20 MG tablet Take 1 tablet (20 mg total) by mouth daily.    . magnesium oxide (MAG-OX) 400 MG tablet Take 400 mg by mouth daily.    . metoprolol succinate (TOPROL-XL) 100 MG 24 hr tablet Take 100 mg by mouth daily. Take with or immediately following a meal.    . Multiple Vitamin (MULTIVITAMIN) tablet Take 1 tablet by mouth daily.    Marland Kitchen omeprazole (PRILOSEC) 40 MG capsule Take 40 mg by mouth daily.    . potassium chloride 20 MEQ TBCR Take 20 mEq by mouth daily. 30 tablet 0  . rizatriptan (MAXALT-MLT) 10 MG disintegrating tablet Take 10 mg by mouth as needed for migraine. May repeat in 2 hours if needed    .  XARELTO 20 MG TABS tablet TAKE 1 TABLET BY MOUTH DAILY WITH SUPPER 30 tablet 3   No current facility-administered medications for this visit.    Allergies:  Levaquin and Sulfa antibiotics   Social History: The patient  reports that she quit smoking about 41 years ago. Her smoking use included Cigarettes. She smoked 0.50 packs per day. She has never used smokeless tobacco. She reports that she does not drink alcohol or use illicit drugs.   ROS:  Please see the history of present illness. Otherwise, complete review of systems is positive for ankle edema, left worse than right with left ankle pain..  All other systems are reviewed and negative.   Physical Exam: VS:  BP 122/58 mmHg  Pulse 70  Ht 5' 1"  (1.549 m)  Wt 180 lb (81.647  kg)  BMI 34.03 kg/m2  SpO2 96%, BMI Body mass index is 34.03 kg/(m^2).  Wt Readings from Last 3 Encounters:  05/31/14 180 lb (81.647 kg)  02/09/14 177 lb (80.287 kg)  01/23/14 174 lb (78.926 kg)     Overweight woman, appears comfortable.  HEENT: Conjunctiva and lids normal, oropharynx clear.  Neck: Supple, no elevated JVP or carotid bruits, no thyromegaly.  Lungs: Course but clear to auscultation, nonlabored breathing at rest.  Cardiac: Regular rate and rhythm, 2/6 systolic murmur at the base, no pericardial rub.  Abdomen: Soft, nontender, bowel sounds present, no guarding or rebound.  Extremities: 1+ edema below the knees, more prominent involving left ankle, distal pulses 2+.  Skin: Warm and dry.  Musculoskeletal: No kyphosis.  Neuropsychiatric: Alert and oriented x3, affect grossly appropriate.   ECG: ECG is not ordered today.  Recent Labwork: 12/28/2013: Hemoglobin 11.0*; Platelets 236 12/29/2013: ALT 30; AST 38*; BUN 18; Creatinine 1.01; Magnesium 2.1; Potassium 3.3*; Pro B Natriuretic peptide (BNP) 1859.0*; Sodium 143   Other Studies Reviewed Today:  Echocardiogram from October 2015 showed moderate LVH with LVEF 60-65%, indeterminate diastolic function, sclerotic aortic valve without stenosis, mild to moderate mitral regurgitation, moderate left atrial enlargement, moderately dilated RV with normal contraction, moderate right atrial enlargement, and PASP 48 mmHg.  ASSESSMENT AND PLAN:  1. Paroxysmal atrial fibrillation, in sinus rhythm on examination today. We will continue current regimen including Toprol-XL, amiodarone, and Xarelto. Follow-up CBC, CMET, and TSH.  2. Chronic diastolic heart failure, continue on Lasix. We discussed diet, sodium restriction.  3. Essential hypertension, blood pressure is controlled today.  Current medicines were reviewed at length with the patient today.   Orders Placed This Encounter  Procedures  . CBC  . Comp Met (CMET)    . TSH    Disposition: FU with me in 6 months.   Signed, Satira Sark, MD, [facility] 05/31/2014 11:42 AM    [facility] [place]. [address], [place], [place] [address] Phone: [phone]; Fax: [phone]

## 2014-05-31 NOTE — Patient Instructions (Signed)
Your physician wants you to follow-up in: 6 months You will receive a reminder letter in the mail two months in advance. If you don't receive a letter, please call our office to schedule the follow-up appointment.    Your physician recommends that you continue on your current medications as directed. Please refer to the Current Medication list given to you today.      PLEASE get lab work done (TSH<CBC<CMET)     Thank you for choosing [facility] !

## 2014-06-01 LAB — CBC WITH DIFFERENTIAL/PLATELET
Basophils Absolute: 0 10*3/uL (ref 0.0–0.2)
Basos: 0 %
EOS: 2 %
Eosinophils Absolute: 0.1 10*3/uL (ref 0.0–0.4)
HCT: 36.3 % (ref 34.0–46.6)
Hemoglobin: 12.1 g/dL (ref 11.1–15.9)
IMMATURE GRANS (ABS): 0 10*3/uL (ref 0.0–0.1)
IMMATURE GRANULOCYTES: 0 %
Lymphocytes Absolute: 1.4 10*3/uL (ref 0.7–3.1)
Lymphs: 29 %
MCH: 32 pg (ref 26.6–33.0)
MCHC: 33.3 g/dL (ref 31.5–35.7)
MCV: 96 fL (ref 79–97)
MONOS ABS: 0.4 10*3/uL (ref 0.1–0.9)
Monocytes: 8 %
NEUTROS PCT: 61 %
Neutrophils Absolute: 2.8 10*3/uL (ref 1.4–7.0)
Platelets: 236 10*3/uL (ref 150–379)
RBC: 3.78 x10E6/uL (ref 3.77–5.28)
RDW: 16.4 % — ABNORMAL HIGH (ref 12.3–15.4)
WBC: 4.7 10*3/uL (ref 3.4–10.8)

## 2014-06-01 LAB — COMPREHENSIVE METABOLIC PANEL
A/G RATIO: 1.7 (ref 1.1–2.5)
ALT: 35 IU/L — AB (ref 0–32)
AST: 40 IU/L (ref 0–40)
Albumin: 4.4 g/dL (ref 3.6–4.8)
Alkaline Phosphatase: 185 IU/L — ABNORMAL HIGH (ref 39–117)
BUN / CREAT RATIO: 13 (ref 11–26)
BUN: 15 mg/dL (ref 8–27)
Bilirubin Total: 0.4 mg/dL (ref 0.0–1.2)
CO2: 26 mmol/L (ref 18–29)
Calcium: 9.4 mg/dL (ref 8.7–10.3)
Chloride: 100 mmol/L (ref 97–108)
Creatinine, Ser: 1.18 mg/dL — ABNORMAL HIGH (ref 0.57–1.00)
GFR, EST AFRICAN AMERICAN: 55 mL/min/{1.73_m2} — AB (ref >59)
GFR, EST NON AFRICAN AMERICAN: 48 mL/min/{1.73_m2} — AB (ref >59)
GLOBULIN, TOTAL: 2.6 g/dL (ref 1.5–4.5)
Glucose: 79 mg/dL (ref 65–99)
POTASSIUM: 3.9 mmol/L (ref 3.5–5.2)
SODIUM: 142 mmol/L (ref 134–144)
TOTAL PROTEIN: 7 g/dL (ref 6.0–8.5)

## 2014-06-01 LAB — TSH: TSH: 1.64 u[IU]/mL (ref 0.450–4.500)

## 2014-06-05 ENCOUNTER — Telehealth: Payer: Self-pay

## 2014-06-05 MED ORDER — FUROSEMIDE 40 MG PO TABS: ORAL_TABLET | ORAL | Status: DC

## 2014-06-05 NOTE — Telephone Encounter (Signed)
Pt verbalized understanding to alternate lasix dose 40 mg,then 20 mg

## 2014-06-05 NOTE — Telephone Encounter (Signed)
-----   Message from Arnoldo Lenis, MD sent at 06/05/2014 12:11 PM EDT ----- Labs reviewed in Dr Chuck Hint absence. Overall look good. There is some evidence she may be a little dehydrate and this has put some strain on the kidneys. Confirm she is taking lasix 40mg  daily, and if so would change her to alternating doses of 40mg  and 20mg .  Zandra Abts MD

## 2014-06-22 ENCOUNTER — Other Ambulatory Visit (HOSPITAL_COMMUNITY): Payer: Self-pay | Admitting: Pulmonary Disease

## 2014-06-22 ENCOUNTER — Ambulatory Visit (HOSPITAL_COMMUNITY)
Admission: RE | Admit: 2014-06-22 | Discharge: 2014-06-22 | Disposition: A | Discharge status: Home / Self Care | Payer: Medicare PPO | Source: Ambulatory Visit | Attending: Pulmonary Disease | Admitting: Pulmonary Disease

## 2014-06-22 DIAGNOSIS — M545 Low back pain: Secondary | ICD-10-CM

## 2014-06-22 DIAGNOSIS — M25551 Pain in right hip: Secondary | ICD-10-CM | POA: Insufficient documentation

## 2014-07-05 DIAGNOSIS — M792 Neuralgia and neuritis, unspecified: Secondary | ICD-10-CM | POA: Diagnosis not present

## 2014-07-05 DIAGNOSIS — M12272 Villonodular synovitis (pigmented), left ankle and foot: Secondary | ICD-10-CM | POA: Diagnosis not present

## 2014-07-05 DIAGNOSIS — M71572 Other bursitis, not elsewhere classified, left ankle and foot: Secondary | ICD-10-CM | POA: Diagnosis not present

## 2014-07-16 DIAGNOSIS — M19072 Primary osteoarthritis, left ankle and foot: Secondary | ICD-10-CM | POA: Diagnosis not present

## 2014-08-07 ENCOUNTER — Other Ambulatory Visit: Payer: Self-pay | Admitting: Cardiology

## 2014-08-20 DIAGNOSIS — M25572 Pain in left ankle and joints of left foot: Secondary | ICD-10-CM | POA: Diagnosis not present

## 2014-08-20 DIAGNOSIS — M722 Plantar fascial fibromatosis: Secondary | ICD-10-CM | POA: Diagnosis not present

## 2014-08-20 DIAGNOSIS — M65872 Other synovitis and tenosynovitis, left ankle and foot: Secondary | ICD-10-CM | POA: Diagnosis not present

## 2014-09-03 ENCOUNTER — Other Ambulatory Visit: Payer: Self-pay

## 2014-09-03 DIAGNOSIS — M792 Neuralgia and neuritis, unspecified: Secondary | ICD-10-CM | POA: Diagnosis not present

## 2014-09-03 DIAGNOSIS — M65872 Other synovitis and tenosynovitis, left ankle and foot: Secondary | ICD-10-CM | POA: Diagnosis not present

## 2014-09-03 DIAGNOSIS — M12272 Villonodular synovitis (pigmented), left ankle and foot: Secondary | ICD-10-CM | POA: Diagnosis not present

## 2014-09-05 ENCOUNTER — Other Ambulatory Visit: Payer: Self-pay | Admitting: Cardiology

## 2014-09-05 ENCOUNTER — Telehealth: Payer: Self-pay

## 2014-09-05 MED ORDER — AMIODARONE HCL 200 MG PO TABS: 100.0000 mg | ORAL_TABLET | Freq: Every day | ORAL | Status: DC

## 2014-09-06 NOTE — Telephone Encounter (Signed)
Refill complete 

## 2014-10-09 DIAGNOSIS — J449 Chronic obstructive pulmonary disease, unspecified: Secondary | ICD-10-CM | POA: Diagnosis not present

## 2014-10-09 DIAGNOSIS — I482 Chronic atrial fibrillation: Secondary | ICD-10-CM | POA: Diagnosis not present

## 2014-10-09 DIAGNOSIS — I5030 Unspecified diastolic (congestive) heart failure: Secondary | ICD-10-CM | POA: Diagnosis not present

## 2014-10-09 DIAGNOSIS — I10 Essential (primary) hypertension: Secondary | ICD-10-CM | POA: Diagnosis not present

## 2014-10-10 ENCOUNTER — Other Ambulatory Visit (HOSPITAL_COMMUNITY): Payer: Self-pay | Admitting: Respiratory Therapy

## 2014-10-10 DIAGNOSIS — G4730 Sleep apnea, unspecified: Secondary | ICD-10-CM

## 2014-10-12 ENCOUNTER — Other Ambulatory Visit (HOSPITAL_COMMUNITY): Payer: Self-pay | Admitting: Respiratory Therapy

## 2014-10-15 ENCOUNTER — Other Ambulatory Visit (HOSPITAL_COMMUNITY): Payer: Self-pay | Admitting: Respiratory Therapy

## 2014-11-05 DIAGNOSIS — M792 Neuralgia and neuritis, unspecified: Secondary | ICD-10-CM | POA: Diagnosis not present

## 2014-11-05 DIAGNOSIS — M12272 Villonodular synovitis (pigmented), left ankle and foot: Secondary | ICD-10-CM | POA: Diagnosis not present

## 2014-11-05 DIAGNOSIS — M65872 Other synovitis and tenosynovitis, left ankle and foot: Secondary | ICD-10-CM | POA: Diagnosis not present

## 2014-11-21 DIAGNOSIS — M65872 Other synovitis and tenosynovitis, left ankle and foot: Secondary | ICD-10-CM | POA: Diagnosis not present

## 2014-11-21 DIAGNOSIS — M12272 Villonodular synovitis (pigmented), left ankle and foot: Secondary | ICD-10-CM | POA: Diagnosis not present

## 2014-11-21 DIAGNOSIS — M792 Neuralgia and neuritis, unspecified: Secondary | ICD-10-CM | POA: Diagnosis not present

## 2014-11-27 ENCOUNTER — Other Ambulatory Visit: Payer: Self-pay | Admitting: Obstetrics & Gynecology

## 2014-11-27 ENCOUNTER — Ambulatory Visit: Payer: Medicare PPO

## 2014-11-27 ENCOUNTER — Ambulatory Visit (INDEPENDENT_AMBULATORY_CARE_PROVIDER_SITE_OTHER): Payer: Medicare PPO | Admitting: Orthopedic Surgery

## 2014-11-27 VITALS — BP 151/80 | Ht 61.0 in | Wt 189.0 lb

## 2014-11-27 DIAGNOSIS — M25551 Pain in right hip: Secondary | ICD-10-CM

## 2014-11-27 DIAGNOSIS — M4317 Spondylolisthesis, lumbosacral region: Secondary | ICD-10-CM | POA: Diagnosis not present

## 2014-11-27 DIAGNOSIS — M5441 Lumbago with sciatica, right side: Secondary | ICD-10-CM

## 2014-11-27 MED ORDER — GABAPENTIN 100 MG PO CAPS: 100.0000 mg | ORAL_CAPSULE | Freq: Three times a day (TID) | ORAL | Status: DC

## 2014-11-27 NOTE — Patient Instructions (Addendum)
CALL [facility] THERAPY DEPT TO SCHEDULE THERAPY VISITS  NEW MEDICATION SENT TO YOUR PHARMACY

## 2014-11-27 NOTE — Progress Notes (Signed)
Patient ID: Diane Cohen, female   DOB: 08/05/1945, 69 y.o.   MRN: [id number]  New patient   Chief Complaint  Patient presents with  . Follow-up    right hip pain, no known injury     Diane Cohen is a 69 y.o. female.   HPI 69 year old female on Xarelto presents with a 3 year history of right hip pain. She describes pain in her lower back radiating down her right leg associated with stiffness and giving out with a throbbing pain which excised exacerbated by supine and recumbent position. She can only take Tylenol because of the medication and she rates her pain from 6-10 out of 10. No previous treatment symptoms worse with prolonged standing and walking  She reports a review of systems of weakness lightheadedness seasonal allergies back pain stiff joints joint pain depression constipation shortness of breath wheezing breathing issues chest pain ankle edema   Review of Systems See hpi  Past Medical History  Diagnosis Date  . Atrial fibrillation   . Hypertension   . Hypothyroidism   . Pneumonia   . TIA (transient ischemic attack)   . Chronic diastolic heart failure     Past Surgical History  Procedure Laterality Date  . Abdominal hysterectomy    . Breast surgery    . Cholecystectomy    . Coloscopy      Family History  Problem Relation Age of Onset  . Hypertension    . Hypertension Father     Social History Social History  Substance Use Topics  . Smoking status: Former Smoker -- 0.50 packs/day    Types: Cigarettes    Quit date: 01/22/1973  . Smokeless tobacco: Never Used  . Alcohol Use: No    Allergies  Allergen Reactions  . Levaquin [Levofloxacin] Other (See Comments)    Headache   . Sulfa Antibiotics Hives    Current Outpatient Prescriptions  Medication Sig Dispense Refill  . acetaminophen (TYLENOL) 500 MG tablet Take 1,000 mg by mouth every 6 (six) hours as needed.    Marland Kitchen albuterol (PROVENTIL HFA;VENTOLIN HFA) 108 (90 BASE) MCG/ACT inhaler Inhale 1-2  puffs into the lungs every 6 (six) hours as needed for wheezing or shortness of breath.    Marland Kitchen amiodarone (PACERONE) 200 MG tablet TAKE ONE-HALF TABLET BY MOUTH EVERY DAY 45 tablet 6  . amiodarone (PACERONE) 200 MG tablet Take 0.5 tablets (100 mg total) by mouth daily. 45 tablet 3  . amLODipine (NORVASC) 5 MG tablet Take 5 mg by mouth daily.    . Calcium Carbonate-Vit D-Min (CALTRATE PLUS PO) Take by mouth 2 (two) times daily.    Marland Kitchen docusate sodium (COLACE) 100 MG capsule Take 1 capsule (100 mg total) by mouth daily. 90 capsule 3  . escitalopram (LEXAPRO) 20 MG tablet Take 20 mg by mouth at bedtime.    Marland Kitchen estradiol (ESTRACE) 1 MG tablet Take 1 tablet (1 mg total) by mouth at bedtime. 30 tablet 11  . fluconazole (DIFLUCAN) 150 MG tablet Take 1 tablet (150 mg total) by mouth once. Take the second tablet 3 days after the first one. 2 tablet 0  . furosemide (LASIX) 40 MG tablet 06/05/14 take 40 mg one day,then 20 mg the next,alternating doses 45 tablet 6  . gabapentin (NEURONTIN) 100 MG capsule Take 1 capsule (100 mg total) by mouth 3 (three) times daily. 90 capsule 1  . levothyroxine (SYNTHROID, LEVOTHROID) 100 MCG tablet Take 100 mcg by mouth daily before breakfast.    .  lisinopril (PRINIVIL,ZESTRIL) 20 MG tablet Take 1 tablet (20 mg total) by mouth daily.    . magnesium oxide (MAG-OX) 400 MG tablet Take 400 mg by mouth daily.    . metoprolol succinate (TOPROL-XL) 100 MG 24 hr tablet Take 100 mg by mouth daily. Take with or immediately following a meal.    . Multiple Vitamin (MULTIVITAMIN) tablet Take 1 tablet by mouth daily.    Marland Kitchen omeprazole (PRILOSEC) 40 MG capsule Take 40 mg by mouth daily.    . potassium chloride 20 MEQ TBCR Take 20 mEq by mouth daily. 30 tablet 0  . rizatriptan (MAXALT-MLT) 10 MG disintegrating tablet Take 10 mg by mouth as needed for migraine. May repeat in 2 hours if needed    . XARELTO 20 MG TABS tablet TAKE 1 TABLET BY MOUTH DAILY WITH SUPPER 30 tablet 6   No current  facility-administered medications for this visit.       Physical Exam Blood pressure 151/80, height 5\' 1"  (1.549 m), weight 189 lb (85.73 kg). Physical Exam The patient is well developed well nourished and well groomed. Orientation to person place and time is normal  Mood is pleasant. Ambulatory status is normal without a limp Skin remains intact without laceration ulceration or erythema Gross motor exam is intact without atrophy. Muscle tone normal grade 5 motor strength Neurovascular exam remains intact Inspection right and left hip both have normal range of motion stability and she has no weakness in her lower extremity she has a positive right straight leg raise negative left she has tenderness in the lower back and right iliac crest and greater trochanter   Joint range of motion Joint stability     Data Reviewed  She presented with a pelvis AP and lateral right hip which I reviewed and they were both normal including the report read as normal  Lumbar spine film included 5 views and after review I agree there is a grade 1 anterolisthesis of L5 on S1 approximately 5 mm no pars defect there is also some upper lumbar levoscoliosis  Assessment   Spondylolisthesis with back pain Plan   Physical therapy, gabapentin 100 mg 3 times a day follow-up 7 weeks

## 2014-11-28 DIAGNOSIS — R634 Abnormal weight loss: Secondary | ICD-10-CM | POA: Diagnosis not present

## 2014-11-28 DIAGNOSIS — G5602 Carpal tunnel syndrome, left upper limb: Secondary | ICD-10-CM | POA: Diagnosis not present

## 2014-11-28 DIAGNOSIS — M5412 Radiculopathy, cervical region: Secondary | ICD-10-CM | POA: Diagnosis not present

## 2014-11-28 DIAGNOSIS — R413 Other amnesia: Secondary | ICD-10-CM | POA: Diagnosis not present

## 2014-11-28 DIAGNOSIS — G609 Hereditary and idiopathic neuropathy, unspecified: Secondary | ICD-10-CM | POA: Diagnosis not present

## 2014-11-28 DIAGNOSIS — M5417 Radiculopathy, lumbosacral region: Secondary | ICD-10-CM | POA: Diagnosis not present

## 2014-11-28 DIAGNOSIS — E538 Deficiency of other specified B group vitamins: Secondary | ICD-10-CM | POA: Diagnosis not present

## 2014-11-28 DIAGNOSIS — R202 Paresthesia of skin: Secondary | ICD-10-CM | POA: Diagnosis not present

## 2014-11-28 DIAGNOSIS — G3184 Mild cognitive impairment, so stated: Secondary | ICD-10-CM | POA: Diagnosis not present

## 2014-11-28 DIAGNOSIS — G603 Idiopathic progressive neuropathy: Secondary | ICD-10-CM | POA: Diagnosis not present

## 2014-11-28 DIAGNOSIS — E531 Pyridoxine deficiency: Secondary | ICD-10-CM | POA: Diagnosis not present

## 2014-12-04 ENCOUNTER — Encounter: Payer: Self-pay | Admitting: Cardiology

## 2014-12-04 ENCOUNTER — Ambulatory Visit (INDEPENDENT_AMBULATORY_CARE_PROVIDER_SITE_OTHER): Payer: Medicare PPO | Admitting: Cardiology

## 2014-12-04 VITALS — BP 136/72 | HR 72 | Ht 61.0 in | Wt 186.0 lb

## 2014-12-04 DIAGNOSIS — I10 Essential (primary) hypertension: Secondary | ICD-10-CM

## 2014-12-04 DIAGNOSIS — I480 Paroxysmal atrial fibrillation: Secondary | ICD-10-CM | POA: Diagnosis not present

## 2014-12-04 DIAGNOSIS — Z79899 Other long term (current) drug therapy: Secondary | ICD-10-CM | POA: Diagnosis not present

## 2014-12-04 LAB — COMPREHENSIVE METABOLIC PANEL
ALK PHOS: 102 U/L (ref 33–130)
ALT: 16 U/L (ref 6–29)
AST: 18 U/L (ref 10–35)
Albumin: 3.8 g/dL (ref 3.6–5.1)
BILIRUBIN TOTAL: 0.4 mg/dL (ref 0.2–1.2)
BUN: 16 mg/dL (ref 7–25)
CALCIUM: 9.8 mg/dL (ref 8.6–10.4)
CO2: 33 mmol/L — ABNORMAL HIGH (ref 20–31)
Chloride: 103 mmol/L (ref 98–110)
Creat: 0.96 mg/dL (ref 0.50–0.99)
Glucose, Bld: 84 mg/dL (ref 65–99)
Potassium: 4.2 mmol/L (ref 3.5–5.3)
Sodium: 138 mmol/L (ref 135–146)
Total Protein: 6.7 g/dL (ref 6.1–8.1)

## 2014-12-04 LAB — CBC
HCT: 37.4 % (ref 36.0–46.0)
HEMOGLOBIN: 12.7 g/dL (ref 12.0–15.0)
MCH: 31.8 pg (ref 26.0–34.0)
MCHC: 34 g/dL (ref 30.0–36.0)
MCV: 93.7 fL (ref 78.0–100.0)
MPV: 9 fL (ref 8.6–12.4)
Platelets: 259 10*3/uL (ref 150–400)
RBC: 3.99 MIL/uL (ref 3.87–5.11)
RDW: 15.4 % (ref 11.5–15.5)
WBC: 5.6 10*3/uL (ref 4.0–10.5)

## 2014-12-04 NOTE — Progress Notes (Signed)
Cardiology Office Note  Date: 12/04/2014   ID: Diane Cohen, DOB [birth year], MRN [id number]  PCP: Alonza Bogus, MD  Primary Cardiologist: Rozann Lesches, MD   Chief Complaint  Patient presents with  . Atrial Fibrillation    History of Present Illness: Diane Cohen is a 69 y.o. female last seen in March. She presents for a routine follow-up visit. Reports intermittent palpitations, no prolonged episode however. She states that she has been under a lot of stress taking care of her husband with advancing Diane Cohen disease.  ECG today shows sinus rhythm. She reports no bleeding problems on Xarelto. Last lab work from March is outlined below.   Past Medical History  Diagnosis Date  . Atrial fibrillation   . Hypertension   . Hypothyroidism   . Pneumonia   . TIA (transient ischemic attack)   . Chronic diastolic heart failure     Current Outpatient Prescriptions  Medication Sig Dispense Refill  . acetaminophen (TYLENOL) 500 MG tablet Take 1,000 mg by mouth every 6 (six) hours as needed.    Marland Kitchen albuterol (PROVENTIL HFA;VENTOLIN HFA) 108 (90 BASE) MCG/ACT inhaler Inhale 1-2 puffs into the lungs every 6 (six) hours as needed for wheezing or shortness of breath.    Marland Kitchen amiodarone (PACERONE) 200 MG tablet Take 0.5 tablets (100 mg total) by mouth daily. 45 tablet 3  . amLODipine (NORVASC) 5 MG tablet Take 5 mg by mouth daily.    . Calcium Carbonate-Vit D-Min (CALTRATE PLUS PO) Take by mouth 2 (two) times daily.    . DULoxetine (CYMBALTA) 60 MG capsule TK 1 C PO QD  6  . estradiol (ESTRACE) 1 MG tablet TAKE 1 TABLET BY MOUTH EVERY NIGHT AT BEDTIME 90 tablet 3  . fluconazole (DIFLUCAN) 150 MG tablet Take 1 tablet (150 mg total) by mouth once. Take the second tablet 3 days after the first one. 2 tablet 0  . furosemide (LASIX) 40 MG tablet 06/05/14 take 40 mg one day,then 20 mg the next,alternating doses 45 tablet 6  . levothyroxine (SYNTHROID, LEVOTHROID) 100 MCG tablet Take 100 mcg  by mouth daily before breakfast.    . lisinopril (PRINIVIL,ZESTRIL) 20 MG tablet Take 1 tablet (20 mg total) by mouth daily.    . magnesium oxide (MAG-OX) 400 MG tablet Take 400 mg by mouth daily.    . metoprolol succinate (TOPROL-XL) 100 MG 24 hr tablet Take 100 mg by mouth daily. Take with or immediately following a meal.    . Multiple Vitamin (MULTIVITAMIN) tablet Take 1 tablet by mouth daily.    Marland Kitchen omeprazole (PRILOSEC) 40 MG capsule Take 40 mg by mouth daily.    . potassium chloride 20 MEQ TBCR Take 20 mEq by mouth daily. 30 tablet 0  . rizatriptan (MAXALT-MLT) 10 MG disintegrating tablet Take 10 mg by mouth as needed for migraine. May repeat in 2 hours if needed    . XARELTO 20 MG TABS tablet TAKE 1 TABLET BY MOUTH DAILY WITH SUPPER 30 tablet 6  . gabapentin (NEURONTIN) 100 MG capsule Take 1 capsule (100 mg total) by mouth 3 (three) times daily. (Patient not taking: Reported on 12/04/2014) 90 capsule 1   No current facility-administered medications for this visit.    Allergies:  Levaquin and Sulfa antibiotics   Social History: The patient  reports that she quit smoking about 41 years ago. Her smoking use included Cigarettes. She smoked 0.50 packs per day. She has never used smokeless tobacco. She reports that  she does not drink alcohol or use illicit drugs.   ROS:  Please see the history of present illness. Otherwise, complete review of systems is positive for back pain.  All other systems are reviewed and negative.   Physical Exam: VS:  BP 136/72 mmHg  Pulse 72  Ht 5\' 1"  (1.549 m)  Wt 186 lb (84.369 kg)  BMI 35.16 kg/m2  SpO2 95%, BMI Body mass index is 35.16 kg/(m^2).  Wt Readings from Last 3 Encounters:  12/04/14 186 lb (84.369 kg)  11/27/14 189 lb (85.73 kg)  05/31/14 180 lb (81.647 kg)     Overweight woman, appears comfortable.  HEENT: Conjunctiva and lids normal, oropharynx clear.  Neck: Supple, no elevated JVP or carotid bruits, no thyromegaly.  Lungs: Course but  clear to auscultation, nonlabored breathing at rest.  Cardiac: Regular rate and rhythm, 2/6 systolic murmur at the base, no pericardial rub.  Abdomen: Soft, nontender, bowel sounds present, no guarding or rebound.  Extremities: 1+ edema below the knees, more prominent involving left ankle, distal pulses 2+.  Skin: Warm and dry.    ECG: ECG is ordered today.  Recent Labwork: 12/29/2013: Magnesium 2.1; Pro B Natriuretic peptide (BNP) 1859.0* 05/31/2014: ALT 35*; AST 40; BUN 15; Creatinine, Ser 1.18*; Hemoglobin 12.1; Platelets 236; Potassium 3.9; Sodium 142; TSH 1.640   Other Studies Reviewed Today:  Echocardiogram from October 2015 showed moderate LVH with LVEF 60-65%, indeterminate diastolic function, sclerotic aortic valve without stenosis, mild to moderate mitral regurgitation, moderate left atrial enlargement, moderately dilated RV with normal contraction, moderate right atrial enlargement, and PASP 48 mmHg.  ASSESSMENT AND PLAN:  1. Paroxysmal atrial fibrillation, maintaining sinus rhythm. Continue low-dose amiodarone, Toprol-XL, and Xarelto. She is due for follow-up lab work including CBC and CMET.  2. Essential hypertension, keep follow-up with Dr. Luan Pulling.  Current medicines were reviewed at length with the patient today.   Orders Placed This Encounter  Procedures  . Comprehensive metabolic panel  . CBC  . EKG 12-Lead    Disposition: FU with me in 6 months.   Signed, Satira Sark, MD, [facility] 12/04/2014 8:51 AM    [facility] [address] [place], [place] [address] Phone: [phone]; Fax: [phone]

## 2014-12-04 NOTE — Patient Instructions (Addendum)
Your physician wants you to follow-up in: 6 months Dr Ferne Reus will receive a reminder letter in the mail two months in advance. If you don't receive a letter, please call our office to schedule the follow-up appointment.    Your physician recommends that you continue on your current medications as directed. Please refer to the Current Medication list given to you today.     Lab work: CBC,CMET      Thank you for choosing [facility] !

## 2014-12-05 DIAGNOSIS — M65872 Other synovitis and tenosynovitis, left ankle and foot: Secondary | ICD-10-CM | POA: Diagnosis not present

## 2014-12-05 DIAGNOSIS — M71572 Other bursitis, not elsewhere classified, left ankle and foot: Secondary | ICD-10-CM | POA: Diagnosis not present

## 2014-12-25 DIAGNOSIS — G3184 Mild cognitive impairment, so stated: Secondary | ICD-10-CM | POA: Diagnosis not present

## 2014-12-25 DIAGNOSIS — M5442 Lumbago with sciatica, left side: Secondary | ICD-10-CM | POA: Diagnosis not present

## 2014-12-25 DIAGNOSIS — M5441 Lumbago with sciatica, right side: Secondary | ICD-10-CM | POA: Diagnosis not present

## 2014-12-25 DIAGNOSIS — G603 Idiopathic progressive neuropathy: Secondary | ICD-10-CM | POA: Diagnosis not present

## 2014-12-25 DIAGNOSIS — M5412 Radiculopathy, cervical region: Secondary | ICD-10-CM | POA: Diagnosis not present

## 2014-12-25 DIAGNOSIS — G5602 Carpal tunnel syndrome, left upper limb: Secondary | ICD-10-CM | POA: Diagnosis not present

## 2015-01-01 ENCOUNTER — Other Ambulatory Visit: Payer: Self-pay | Admitting: Cardiology

## 2015-01-02 ENCOUNTER — Telehealth: Payer: Self-pay | Admitting: Orthopedic Surgery

## 2015-01-02 DIAGNOSIS — M7752 Other enthesopathy of left foot: Secondary | ICD-10-CM | POA: Diagnosis not present

## 2015-01-02 DIAGNOSIS — M12272 Villonodular synovitis (pigmented), left ankle and foot: Secondary | ICD-10-CM | POA: Diagnosis not present

## 2015-01-02 NOTE — Telephone Encounter (Signed)
Patient has called to request new order, had not yet done physical therapy for [facility] out-patient.  Follow up appointment to return here also re-scheduled.

## 2015-01-02 NOTE — Telephone Encounter (Signed)
Order should still be good? It was from last month.

## 2015-01-03 NOTE — Telephone Encounter (Signed)
Order had been closed out; therefore, [facility] out-patient rehab [facility]) is requesting new order.

## 2015-01-14 ENCOUNTER — Other Ambulatory Visit: Payer: Self-pay | Admitting: *Deleted

## 2015-01-14 ENCOUNTER — Telehealth: Payer: Self-pay | Admitting: Orthopedic Surgery

## 2015-01-14 DIAGNOSIS — M545 Low back pain: Secondary | ICD-10-CM

## 2015-01-14 NOTE — Telephone Encounter (Signed)
Faxed to [facility] out-patient physical therapy; patient notified.

## 2015-01-14 NOTE — Telephone Encounter (Signed)
New order entered as requested   plz fax

## 2015-01-14 NOTE — Telephone Encounter (Signed)
Patient states [facility] out-patient/[facility] still needs orders for her physical therapy, which had "run out" per previous note 01/07/15- please advise.

## 2015-01-15 ENCOUNTER — Ambulatory Visit: Payer: Self-pay | Admitting: Orthopedic Surgery

## 2015-01-17 ENCOUNTER — Ambulatory Visit (HOSPITAL_COMMUNITY): Payer: Medicare PPO | Attending: Orthopedic Surgery | Admitting: Physical Therapy

## 2015-01-17 DIAGNOSIS — M6281 Muscle weakness (generalized): Secondary | ICD-10-CM | POA: Diagnosis not present

## 2015-01-17 DIAGNOSIS — M5441 Lumbago with sciatica, right side: Secondary | ICD-10-CM | POA: Diagnosis not present

## 2015-01-17 DIAGNOSIS — R293 Abnormal posture: Secondary | ICD-10-CM | POA: Insufficient documentation

## 2015-01-17 DIAGNOSIS — R6889 Other general symptoms and signs: Secondary | ICD-10-CM | POA: Diagnosis not present

## 2015-01-17 NOTE — Therapy (Signed)
[place], [place], [address] Phone: [phone]   Fax:  [phone]  Physical Therapy Evaluation  Patient Details  Name: Diane Cohen MRN: [id number] Date of Birth: Feb 21, 1946 Referring Provider: Dr. Aline Brochure   Encounter Date: 01/17/2015      PT End of Session - 01/17/15 1823    Visit Number 1   Number of Visits 12   Date for PT Re-Evaluation 02/14/15   Authorization Type [employer] and [employer]    Authorization Time Period 01/17/15 to 03/19/15   Authorization - Visit Number 1   Authorization - Number of Visits 10   PT Start Time 1430   PT Stop Time 1509   PT Time Calculation (min) 39 min   Equipment Utilized During Treatment Gait belt   Activity Tolerance Patient tolerated treatment well   Behavior During Therapy [facility] for tasks assessed/performed      Past Medical History  Diagnosis Date  . Atrial fibrillation   . Hypertension   . Hypothyroidism   . Pneumonia   . TIA (transient ischemic attack)   . Chronic diastolic heart failure     Past Surgical History  Procedure Laterality Date  . Abdominal hysterectomy    . Breast surgery    . Cholecystectomy    . Coloscopy      There were no vitals filed for this visit.  Visit Diagnosis:  Midline low back pain with right-sided sciatica - Plan: PT plan of care cert/re-cert  Poor posture - Plan: PT plan of care cert/re-cert  Muscle weakness - Plan: PT plan of care cert/re-cert  Decreased functional activity tolerance - Plan: PT plan of care cert/re-cert      Subjective Assessment - 01/17/15 1435    Subjective Pain is around 4/10; she also reports that her hip and her foot bother her as well. She does have pain running down the back of her right leg.   Pertinent History Patient reports that her back started hurting about 8 months ago, but it didn't really keep her from doing anything; Dr. Luan Pulling got images done of the back and found  anterolithessis in lumbar spine. Pain has gotten worse over time. She did receive a couple of shots in her back about a month ago.    How long can you sit comfortably? 60 minutes    How long can you stand comfortably? 20 minutes    How long can you walk comfortably? 168ft approximately    Patient Stated Goals reduce pain    Currently in Pain? Yes   Pain Score 4    Pain Location Back   Pain Orientation Lower   Pain Descriptors / Indicators Dull            [facility] PT Assessment - 01/17/15 0001    Assessment   Medical Diagnosis low back pain    Referring Provider Dr. Aline Brochure    Onset Date/Surgical Date --  chronic    Next MD Visit December 1st with Dr. Aline Brochure    Precautions   Precautions Other (comment)   Precaution Comments lumbar anterolisthesis L5/S1    Restrictions   Weight Bearing Restrictions No   Balance Screen   Has the patient fallen in the past 6 months Yes   How many times? slipped on wheelchair ramp    Has the patient had a decrease in activity level because of a fear of falling?  Yes   Is the patient reluctant to leave their home  because of a fear of falling?  No   Prior Function   Level of Independence Independent;Independent with basic ADLs;Independent with gait;Independent with transfers   Vocation Retired   Leisure takes care of husband but this does not require a lot of lifting right now; no other hobbies    Observation/Other Assessments   Focus on Therapeutic Outcomes (FOTO)  67% limited    Posture/Postural Control   Posture Comments supination in stance, flexed at hips, forward head with B IR shouldres, thoracic kyphosis, lumbar lordoisis    AROM   Right Hip External Rotation  --  moderate functional stiffness    Right Hip Internal Rotation  38   Left Hip External Rotation  --  moderate functional stiffness    Left Hip Internal Rotation  45   Lumbar Flexion 65   Lumbar Extension 8   Lumbar - Right Side Bend fingers just above distal surface of femur     Lumbar - Left Side Bend fingers just above distal end of femur    Lumbar - Right Rotation moderate limitation    Lumbar - Left Rotation moderate limitation   Strength   Overall Strength Comments upper core 4/5, lower core approx 2+/5   Right Hip Flexion 3/5   Right Hip Extension 2+/5   Right Hip ABduction 2/5   Left Hip Flexion 3/5   Left Hip Extension 2+/5   Left Hip ABduction 3/5   Right Knee Flexion 4/5   Right Knee Extension 4/5   Left Knee Flexion 4-/5   Left Knee Extension 4+/5   Right Ankle Dorsiflexion 4+/5   Left Ankle Dorsiflexion 3+/5   Flexibility   Hamstrings no significant tightness noted and no positive sciatic symptoms with neuro flossing    Piriformis moderate tightness    Ambulation/Gait   Gait Comments proximal muscle weaknses, flexed at hips, supination especially L foot, reduced gait speed, reduced rotation of trunk and pelvis                            PT Education - 01/17/15 1823    Education provided Yes   Education Details prognosis, plan of care, HEP    Person(s) Educated Patient   Methods Explanation;Handout   Comprehension Verbalized understanding;Returned demonstration          PT Short Term Goals - 01/17/15 1831    PT SHORT TERM GOAL #1   Title Patient to be able to verbalize the improtance of improved posture and will be able to maintain correct posture at least 80% of the time in order to reduce pain and enhance functional task performance    Time 3   Period Weeks   Status New   PT SHORT TERM GOAL #2   Title Patient will be able to touch toes with lumbar forward flexion, be able to reach past knee joint line bilaterally with lateral flexion in order to improve overall posture and mobility and reduce stiffness and pain    Time 3   Period Weeks   Status New   PT SHORT TERM GOAL #3   Title Patient will experience no more than 2/10 pain with functional activities and tasks in standing for at least 90 minutes, with no  pain running down the back of her legs in order to enhance functional activity tolerance and task performance skills    Time 3   Period Weeks   Status New   PT SHORT TERM GOAL #  4   Title Patient will be independent in correctly and consistently performing appropriate HEP, to be updated PRN    Time 3   Period Weeks   Status New           PT Long Term Goals - February 03, 2015 1836    PT LONG TERM GOAL #1   Title Patient to demonstrate 5/5 strength in bilateral lower extremities and at least 4/5 strength in upper/lower core in order to improve functional task performance and reduce pain    Time 6   Period Weeks   Status New   PT LONG TERM GOAL #2   Title Patient to be able to lift at least 15 pounds while demonstrated correct functional mechanics in order to allow her to perform functional tasks around her home with minimal risk of re-injury    Time 6   Period Weeks   Status New   PT LONG TERM GOAL #3   Title Patient will regularly perform at least 20 mintues of light to moderate intensity physical activity at least 3 times per week in order to maintain functional gains and reduce risk of exacerbation of pain    Time 6   Period Weeks   Status New               Plan - 2015/02/03 1825    Clinical Impression Statement Patient presents with chronic back pain, muscle weakness, lumbar stiffness, impaired posture, difficulty standing for extended periods of time, and reduced functional activity tolerance overall. Patient reports that she is not very active beyond helping her husband with mobility as he does havea  progressive neurological disease; imaging also revealed L5/Si anterolithesis indicating that extension based activities may increase pain and symtpoms. At this time patient will benefit from skilled PT services in order to address her functional limitations and to assist her in reaching an optimal level of function.    Pt will benefit from skilled therapeutic intervention in order to  improve on the following deficits Abnormal gait;Decreased endurance;Hypomobility;Decreased activity tolerance;Decreased strength;Pain;Decreased balance;Decreased mobility;Difficulty walking;Improper body mechanics;Decreased coordination;Impaired flexibility;Postural dysfunction   Rehab Potential Good   PT Frequency 2x / week   PT Duration 6 weeks   PT Treatment/Interventions ADLs/Self Care Home Management;Moist Heat;Gait training;Stair training;Functional mobility training;Therapeutic activities;Therapeutic exercise;Balance training;Neuromuscular re-education;Patient/family education;Manual techniques   PT Next Visit Plan review HEP and goals; pelvic tilts to assist in improving lordosis of lumbar spine, functional strength and postural training with caution regarding extension due to anterolisthesis    PT Home Exercise Plan given    Consulted and Agree with Plan of Care Patient          G-Codes - 02-03-2015 1841    Functional Assessment Tool Used Per FOTO 67% limited    Functional Limitation Mobility: Walking and moving around   Mobility: Walking and Moving Around Current Status [phone]) At least 60 percent but less than 80 percent impaired, limited or restricted   Mobility: Walking and Moving Around Goal Status [phone]) At least 40 percent but less than 60 percent impaired, limited or restricted       Problem List Patient Active Problem List   Diagnosis Date Noted  . Chronic diastolic heart failure ([place]) 12/29/2013  . Acute on chronic diastolic heart failure ([place]) 12/29/2013  . Anemia 09/20/2013  . Bilateral leg edema 08/29/2013  . Essential hypertension, benign   . Atrial fibrillation [facility])     Deniece Ree PT, DPT [place]  [address] [place], [place], [address] Phone: [phone]   Fax:  [phone]  Name: Diane Cohen MRN: [id number] Date of Birth: 1945/06/13

## 2015-01-17 NOTE — Patient Instructions (Signed)
   BRIDGING  While lying on your back, tighten your lower abdominals, squeeze your buttocks and then raise your buttocks off the floor/bed as creating a "Bridge" with your body.  Repeat 10 times, twice a day.     Hip Abduction  Standing tall, lift one leg out to the side then return. Make sure you are keeping your toes facing forwards. You may do this at a counter for support.   Repeat 10 times each leg, twice a day.    Transverse Abdominus Activation  Lying on your back, pull your bellybutton into your spine.  Hold for 1 second and relax. You can do this exercise laying down, standing, or sitting.  Repeat 20 times, 3 times a day.

## 2015-01-21 ENCOUNTER — Ambulatory Visit (HOSPITAL_COMMUNITY): Payer: Medicare PPO | Admitting: Physical Therapy

## 2015-01-21 DIAGNOSIS — R293 Abnormal posture: Secondary | ICD-10-CM

## 2015-01-21 DIAGNOSIS — M6281 Muscle weakness (generalized): Secondary | ICD-10-CM | POA: Diagnosis not present

## 2015-01-21 DIAGNOSIS — M5441 Lumbago with sciatica, right side: Secondary | ICD-10-CM

## 2015-01-21 DIAGNOSIS — R6889 Other general symptoms and signs: Secondary | ICD-10-CM | POA: Diagnosis not present

## 2015-01-21 NOTE — Therapy (Signed)
[place], [place], [address] Phone: [phone]   Fax:  [phone]  Physical Therapy Treatment  Patient Details  Name: Diane Cohen MRN: [id number] Date of Birth: 03-21-1945 Referring Provider: Dr. Aline Brochure   Encounter Date: 01/21/2015      PT End of Session - 01/21/15 1057    Visit Number 2   Number of Visits 12   Date for PT Re-Evaluation 02/14/15   Authorization Type [employer] and [employer]    Authorization Time Period 01/17/15 to 03/19/15   Authorization - Visit Number 2   Authorization - Number of Visits 10   PT Start Time 1022   PT Stop Time 1100   PT Time Calculation (min) 38 min   Activity Tolerance Patient tolerated treatment well      Past Medical History  Diagnosis Date  . Atrial fibrillation   . Hypertension   . Hypothyroidism   . Pneumonia   . TIA (transient ischemic attack)   . Chronic diastolic heart failure     Past Surgical History  Procedure Laterality Date  . Abdominal hysterectomy    . Breast surgery    . Cholecystectomy    . Coloscopy      There were no vitals filed for this visit.  Visit Diagnosis:  Midline low back pain with right-sided sciatica  Poor posture  Muscle weakness      Subjective Assessment - 01/21/15 1024    Subjective Pt states that she is doing her exercises.  She is mainly having hip pain    Currently in Pain? Yes   Pain Score 2    Pain Location Back   Pain Orientation Lower   Pain Descriptors / Indicators Aching   Pain Type Chronic pain   Pain Radiating Towards Radiates into Rt hip especially at night.  Sometimes pain will go down into her foot .  Hip pain is at a 4/10    Pain Onset More than a month ago   Pain Frequency Intermittent                         [facility] Adult PT Treatment/Exercise - 01/21/15 1037    Exercises   Exercises Lumbar   Lumbar Exercises: Stretches   Active Hamstring Stretch 3 reps;30 seconds    Active Hamstring Stretch Limitations supine    Single Knee to Chest Stretch 3 reps;30 seconds   Standing Side Bend 5 reps   Standing Extension 5 reps   Lumbar Exercises: Seated   Other Seated Lumbar Exercises scapular retraction x 10    Lumbar Exercises: Supine   Ab Set 10 reps   Bent Knee Raise 10 reps   Bridge 10 reps   Straight Leg Raise --                PT Education - 01/21/15 1056    Education provided Yes   Education Details new stabilization exercises    Methods Explanation;Tactile cues;Verbal cues;Handout   Comprehension Verbalized understanding;Returned demonstration;Verbal cues required          PT Short Term Goals - 01/17/15 1831    PT SHORT TERM GOAL #1   Title Patient to be able to verbalize the improtance of improved posture and will be able to maintain correct posture at least 80% of the time in order to reduce pain and enhance functional task performance    Time 3   Period Weeks  Status New   PT SHORT TERM GOAL #2   Title Patient will be able to touch toes with lumbar forward flexion, be able to reach past knee joint line bilaterally with lateral flexion in order to improve overall posture and mobility and reduce stiffness and pain    Time 3   Period Weeks   Status New   PT SHORT TERM GOAL #3   Title Patient will experience no more than 2/10 pain with functional activities and tasks in standing for at least 90 minutes, with no pain running down the back of her legs in order to enhance functional activity tolerance and task performance skills    Time 3   Period Weeks   Status New   PT SHORT TERM GOAL #4   Title Patient will be independent in correctly and consistently performing appropriate HEP, to be updated PRN    Time 3   Period Weeks   Status New           PT Long Term Goals - 01/17/15 1836    PT LONG TERM GOAL #1   Title Patient to demonstrate 5/5 strength in bilateral lower extremities and at least 4/5 strength in upper/lower core in  order to improve functional task performance and reduce pain    Time 6   Period Weeks   Status New   PT LONG TERM GOAL #2   Title Patient to be able to lift at least 15 pounds while demonstrated correct functional mechanics in order to allow her to perform functional tasks around her home with minimal risk of re-injury    Time 6   Period Weeks   Status New   PT LONG TERM GOAL #3   Title Patient will regularly perform at least 20 mintues of light to moderate intensity physical activity at least 3 times per week in order to maintain functional gains and reduce risk of exacerbation of pain    Time 6   Period Weeks   Status New               Plan - 01/21/15 1058    Clinical Impression Statement Reviewed pt evaluation and HEP.  Pt needed instruction on HEP as pt was not completing correctly..  Instructed in bent knee raise needing mutimodal cuing for proper technique.  Pt is not able to complete this exercise correctly reciprocally at this point and needs to [facility] Lt LE only as she loses technique when she attempts to raise her Lt foot off.    PT Next Visit Plan  pelvic tilts to assist in improving lordosis of lumbar spine, functional strength and postural training with caution regarding extension due to anterolisthesis    Consulted and Agree with Plan of Care Patient        Problem List Patient Active Problem List   Diagnosis Date Noted  . Chronic diastolic heart failure ([place]) 12/29/2013  . Acute on chronic diastolic heart failure ([place]) 12/29/2013  . Anemia 09/20/2013  . Bilateral leg edema 08/29/2013  . Essential hypertension, benign   . Atrial fibrillation [facility])     Rayetta Humphrey, PT CLT [phone] 01/21/2015, 11:02 AM  [place], [place], [address] Phone: [phone]   Fax:  [phone]  Name: Diane Cohen MRN: [id number] Date of Birth: 20-Jul-1945

## 2015-01-21 NOTE — Patient Instructions (Signed)
Bent Leg Lift (Hook-Lying)    Tighten stomach and slowly raise right leg _2___ inches from floor. Keep trunk rigid. Hold __2__ seconds. Repeat _5-15___ times per set. Do 1____ sets per session. Do __2__ sessions per day. Repeat to left  Leg  http://orth.exer.us/1090   Copyright  VHI. All rights reserved.  Straight Leg Raise   Hold for now  Tighten stomach and slowly raise locked right leg ____ inches from floor. Repeat ____ times per set. Do ____ sets per session. Do ____ sessions per day.  http://orth.exer.us/1102   Copyright  VHI. All rights reserved.  Backward Bend (Standing)    Arch backward to make hollow of back deeper. Hold __2__ seconds. Repeat __5__ times per set. Do ___1_ sets per session. Do __4__ sessions per day.  http://orth.exer.us/178   Copyright  VHI. All rights reserved.  Hamstring Stretch: Active    Support behind right knee. Starting with knee bent, attempt to straighten knee until a comfortable stretch is felt in back of thigh. Hold 20____ seconds. Repeat 3____ times per set. Do ____ sets per session. Do 2____ sessions per day.  http://orth.exer.us/158   Copyright  VHI. All rights reserved.  Knee-to-Chest Stretch: Unilateral    With hand behind right knee, pull knee in to chest until a comfortable stretch is felt in lower back and buttocks. Keep back relaxed. Hold _15___ seconds. Repeat ___3_ times per set. Do _1___ sets per session. Do _2___ sessions per day.  http://orth.exer.us/126   Copyright  VHI. All rights reserved.  Scapular Retraction (Standing)    With arms at sides, pinch shoulder blades together. Repeat _10___ times per set. Do __1__ sets per session. Do __3__ sessions per day.  http://orth.exer.us/944   Copyright  VHI. All rights reserved.

## 2015-01-23 DIAGNOSIS — M65872 Other synovitis and tenosynovitis, left ankle and foot: Secondary | ICD-10-CM | POA: Diagnosis not present

## 2015-01-23 DIAGNOSIS — M12272 Villonodular synovitis (pigmented), left ankle and foot: Secondary | ICD-10-CM | POA: Diagnosis not present

## 2015-01-24 ENCOUNTER — Encounter (HOSPITAL_COMMUNITY): Payer: Self-pay | Admitting: Physical Therapy

## 2015-01-28 ENCOUNTER — Ambulatory Visit (HOSPITAL_COMMUNITY): Payer: Medicare PPO | Admitting: Physical Therapy

## 2015-01-28 DIAGNOSIS — R6889 Other general symptoms and signs: Secondary | ICD-10-CM

## 2015-01-28 DIAGNOSIS — M6281 Muscle weakness (generalized): Secondary | ICD-10-CM

## 2015-01-28 DIAGNOSIS — R293 Abnormal posture: Secondary | ICD-10-CM

## 2015-01-28 DIAGNOSIS — M5441 Lumbago with sciatica, right side: Secondary | ICD-10-CM

## 2015-01-28 NOTE — Therapy (Signed)
[place], [place], [address] Phone: [phone]   Fax:  [phone]  Physical Therapy Treatment  Patient Details  Name: Diane Cohen MRN: [id number] Date of Birth: [birth year] Referring Provider: Dr. Aline Brochure   Encounter Date: 01/28/2015      PT End of Session - 01/28/15 0953    Visit Number 3   Number of Visits 12   Date for PT Re-Evaluation 02/14/15   Authorization Type [employer] and [employer]    Authorization Time Period 01/17/15 to 03/19/15   Authorization - Visit Number 3   Authorization - Number of Visits 10   PT Start Time 0935   PT Stop Time 1015   PT Time Calculation (min) 40 min   Behavior During Therapy [facility] for tasks assessed/performed      Past Medical History  Diagnosis Date  . Atrial fibrillation   . Hypertension   . Hypothyroidism   . Pneumonia   . TIA (transient ischemic attack)   . Chronic diastolic heart failure     Past Surgical History  Procedure Laterality Date  . Abdominal hysterectomy    . Breast surgery    . Cholecystectomy    . Coloscopy      There were no vitals filed for this visit.  Visit Diagnosis:  Midline low back pain with right-sided sciatica  Poor posture  Muscle weakness  Decreased functional activity tolerance      Subjective Assessment - 01/28/15 0936    Subjective Pt states that she has not done her exercises.  Her pain is the same.   Currently in Pain? Yes   Pain Score 3    Pain Location Back   Pain Orientation Lower                         [facility] Adult PT Treatment/Exercise - 01/28/15 0938    Lumbar Exercises: Stretches   Active Hamstring Stretch 3 reps;30 seconds   Active Hamstring Stretch Limitations 12" box    Pelvic Tilt 2 reps;5 reps   Standing Side Bend 5 reps   Lumbar Exercises: Standing   Heel Raises 10 reps   Functional Squats 10 reps   Scapular Retraction Strengthening;Both;10 reps;Theraband   Theraband  Level (Scapular Retraction) Level 3 (Green)   Row Strengthening;Both;10 reps;Theraband   Theraband Level (Row) Level 3 (Green)   Shoulder Extension Strengthening;Both;10 reps;Theraband   Other Standing Lumbar Exercises standing fire hydrant, hip extension with knee flexed x 10 B    Other Standing Lumbar Exercises SLS 10" x 5 B    Lumbar Exercises: Supine   Bent Knee Raise 10 reps   Bridge 10 reps                PT Education - 01/28/15 1004    Education Details added standing postural t-band exercies    Person(s) Educated Patient   Methods Explanation;Tactile cues;Verbal cues   Comprehension Verbalized understanding;Returned demonstration          PT Short Term Goals - 01/17/15 1831    PT SHORT TERM GOAL #1   Title Patient to be able to verbalize the improtance of improved posture and will be able to maintain correct posture at least 80% of the time in order to reduce pain and enhance functional task performance    Time 3   Period Weeks   Status New   PT SHORT TERM GOAL #2   Title Patient will  be able to touch toes with lumbar forward flexion, be able to reach past knee joint line bilaterally with lateral flexion in order to improve overall posture and mobility and reduce stiffness and pain    Time 3   Period Weeks   Status New   PT SHORT TERM GOAL #3   Title Patient will experience no more than 2/10 pain with functional activities and tasks in standing for at least 90 minutes, with no pain running down the back of her legs in order to enhance functional activity tolerance and task performance skills    Time 3   Period Weeks   Status New   PT SHORT TERM GOAL #4   Title Patient will be independent in correctly and consistently performing appropriate HEP, to be updated PRN    Time 3   Period Weeks   Status New           PT Long Term Goals - 01/17/15 1836    PT LONG TERM GOAL #1   Title Patient to demonstrate 5/5 strength in bilateral lower extremities and at  least 4/5 strength in upper/lower core in order to improve functional task performance and reduce pain    Time 6   Period Weeks   Status New   PT LONG TERM GOAL #2   Title Patient to be able to lift at least 15 pounds while demonstrated correct functional mechanics in order to allow her to perform functional tasks around her home with minimal risk of re-injury    Time 6   Period Weeks   Status New   PT LONG TERM GOAL #3   Title Patient will regularly perform at least 20 mintues of light to moderate intensity physical activity at least 3 times per week in order to maintain functional gains and reduce risk of exacerbation of pain    Time 6   Period Weeks   Status New               Plan - 01/28/15 0953    Clinical Impression Statement Began standing exercises with verbal cuing for proper technique.  Pt  with better form with supine exercises    PT Frequency 2x / week   PT Duration 6 weeks   PT Next Visit Plan Begin lunging exercises         Problem List Patient Active Problem List   Diagnosis Date Noted  . Chronic diastolic heart failure ([place]) 12/29/2013  . Acute on chronic diastolic heart failure ([place]) 12/29/2013  . Anemia 09/20/2013  . Bilateral leg edema 08/29/2013  . Essential hypertension, benign   . Atrial fibrillation [facility])     Rayetta Humphrey, PT CLT [phone] 01/28/2015, 10:16 AM  [place], [place], [address] Phone: [phone]   Fax:  [phone]  Name: Diane Cohen MRN: [id number] Date of Birth: 1945/03/17

## 2015-01-30 ENCOUNTER — Ambulatory Visit (HOSPITAL_COMMUNITY): Payer: Medicare PPO | Admitting: Physical Therapy

## 2015-01-30 DIAGNOSIS — R6889 Other general symptoms and signs: Secondary | ICD-10-CM | POA: Diagnosis not present

## 2015-01-30 DIAGNOSIS — R293 Abnormal posture: Secondary | ICD-10-CM | POA: Diagnosis not present

## 2015-01-30 DIAGNOSIS — M5441 Lumbago with sciatica, right side: Secondary | ICD-10-CM | POA: Diagnosis not present

## 2015-01-30 DIAGNOSIS — M6281 Muscle weakness (generalized): Secondary | ICD-10-CM | POA: Diagnosis not present

## 2015-01-30 NOTE — Therapy (Signed)
[place], [place], [address] Phone: [phone]   Fax:  [phone]  Physical Therapy Treatment  Patient Details  Name: Diane Cohen MRN: [id number] Date of Birth: 1946/02/25 Referring Provider: Dr. Aline Brochure   Encounter Date: 01/30/2015      PT End of Session - 01/30/15 1516    Visit Number 4   Number of Visits 12   Date for PT Re-Evaluation 02/14/15   Authorization Type [employer] and [employer]    Authorization Time Period 01/17/15 to 03/19/15   Authorization - Visit Number 4   Authorization - Number of Visits 10   PT Start Time 1440   PT Stop Time 1520   PT Time Calculation (min) 40 min   Behavior During Therapy [facility] for tasks assessed/performed      Past Medical History  Diagnosis Date  . Atrial fibrillation   . Hypertension   . Hypothyroidism   . Pneumonia   . TIA (transient ischemic attack)   . Chronic diastolic heart failure     Past Surgical History  Procedure Laterality Date  . Abdominal hysterectomy    . Breast surgery    . Cholecystectomy    . Coloscopy      There were no vitals filed for this visit.  Visit Diagnosis:  Midline low back pain with right-sided sciatica  Poor posture  Muscle weakness  Decreased functional activity tolerance      Subjective Assessment - 01/30/15 1543    Subjective Pt states she is not having any pain today. STates she feels the PT has helped alot.   Currently in Pain? No/denies                         [facility] Adult PT Treatment/Exercise - 01/30/15 1453    Lumbar Exercises: Aerobic   Stationary Bike nustep level 2 hills #2 8 minutes UE/LE   Lumbar Exercises: Standing   Heel Raises 15 reps   Functional Squats 15 reps   Forward Lunge 10 reps   Forward Lunge Limitations onto 4" step   Scapular Retraction Strengthening;Both;10 reps;Theraband   Theraband Level (Scapular Retraction) Level 3 (Green)   Row Strengthening;Both;10  reps;Theraband   Theraband Level (Row) Level 3 (Green)   Shoulder Extension Strengthening;Both;10 reps;Theraband   Theraband Level (Shoulder Extension) Level 3 (Green)   Other Standing Lumbar Exercises hip extension and abduction 10 reps each    Other Standing Lumbar Exercises SLS 3X x 6" Lt, 19" Rt                  PT Short Term Goals - 01/17/15 1831    PT SHORT TERM GOAL #1   Title Patient to be able to verbalize the improtance of improved posture and will be able to maintain correct posture at least 80% of the time in order to reduce pain and enhance functional task performance    Time 3   Period Weeks   Status New   PT SHORT TERM GOAL #2   Title Patient will be able to touch toes with lumbar forward flexion, be able to reach past knee joint line bilaterally with lateral flexion in order to improve overall posture and mobility and reduce stiffness and pain    Time 3   Period Weeks   Status New   PT SHORT TERM GOAL #3   Title Patient will experience no more than 2/10 pain with functional activities and tasks  in standing for at least 90 minutes, with no pain running down the back of her legs in order to enhance functional activity tolerance and task performance skills    Time 3   Period Weeks   Status New   PT SHORT TERM GOAL #4   Title Patient will be independent in correctly and consistently performing appropriate HEP, to be updated PRN    Time 3   Period Weeks   Status New           PT Long Term Goals - 01/17/15 1836    PT LONG TERM GOAL #1   Title Patient to demonstrate 5/5 strength in bilateral lower extremities and at least 4/5 strength in upper/lower core in order to improve functional task performance and reduce pain    Time 6   Period Weeks   Status New   PT LONG TERM GOAL #2   Title Patient to be able to lift at least 15 pounds while demonstrated correct functional mechanics in order to allow her to perform functional tasks around her home with minimal  risk of re-injury    Time 6   Period Weeks   Status New   PT LONG TERM GOAL #3   Title Patient will regularly perform at least 20 mintues of light to moderate intensity physical activity at least 3 times per week in order to maintain functional gains and reduce risk of exacerbation of pain    Time 6   Period Weeks   Status New               Plan - 01/30/15 1517    Clinical Impression Statement Progressed standing exericses with lunges, hip abduction and extension to increase LE strength and stability.  Improved SLS time today to 19 seconds on Rt LE and 6" with Lt LE.  Pt without c/o pain during or after session.  Added nustep to end of session to work on activity tolerance.    PT Next Visit Plan Progress functional strengtheing towards goals.          Problem List Patient Active Problem List   Diagnosis Date Noted  . Chronic diastolic heart failure ([place]) 12/29/2013  . Acute on chronic diastolic heart failure ([place]) 12/29/2013  . Anemia 09/20/2013  . Bilateral leg edema 08/29/2013  . Essential hypertension, benign   . Atrial fibrillation [facility])     Teena Irani, PTA/CLT [phone] 01/30/2015, 3:44 PM  [place] [address] [place], [place], [address] Phone: [phone]   Fax:  [phone]  Name: Diane Cohen MRN: [id number] Date of Birth: 1945/05/05

## 2015-02-04 ENCOUNTER — Ambulatory Visit (HOSPITAL_COMMUNITY): Payer: Medicare PPO | Admitting: Physical Therapy

## 2015-02-07 ENCOUNTER — Ambulatory Visit (INDEPENDENT_AMBULATORY_CARE_PROVIDER_SITE_OTHER): Payer: Medicare PPO | Admitting: Orthopedic Surgery

## 2015-02-07 ENCOUNTER — Ambulatory Visit (HOSPITAL_COMMUNITY): Payer: Medicare PPO | Attending: Orthopedic Surgery | Admitting: Physical Therapy

## 2015-02-07 VITALS — BP 126/68 | Ht 61.0 in | Wt 195.0 lb

## 2015-02-07 DIAGNOSIS — R293 Abnormal posture: Secondary | ICD-10-CM | POA: Diagnosis not present

## 2015-02-07 DIAGNOSIS — M25551 Pain in right hip: Secondary | ICD-10-CM | POA: Diagnosis not present

## 2015-02-07 DIAGNOSIS — M5441 Lumbago with sciatica, right side: Secondary | ICD-10-CM | POA: Diagnosis not present

## 2015-02-07 DIAGNOSIS — M6281 Muscle weakness (generalized): Secondary | ICD-10-CM | POA: Insufficient documentation

## 2015-02-07 DIAGNOSIS — M4317 Spondylolisthesis, lumbosacral region: Secondary | ICD-10-CM | POA: Diagnosis not present

## 2015-02-07 DIAGNOSIS — R6889 Other general symptoms and signs: Secondary | ICD-10-CM | POA: Insufficient documentation

## 2015-02-07 NOTE — Progress Notes (Signed)
Follow-up visit 69 years old. History of right hip pain which we evaluated and found her to have lower back issues. Sedative physical therapy she reports improvement in both her back and hip pain  Review of systems is unchanged from 11/27/2014 at which time she had  weakness lightheadedness seasonal allergies back pain stiff joints joint pain depression constipation shortness of breath wheezing breathing issues chest pain ankle edema  BP 126/68 mmHg  Ht 5\' 1"  (1.549 m)  Wt 195 lb (88.451 kg)  BMI 36.86 kg/m2 Reexamination shows excellent hip flexion no tenderness in the back motor exam normal hip stable neurovascular exam intact  Impression resolved spondylolisthesis  Encounter Diagnoses  Name Primary?  . Right hip pain Yes  . Spondylolisthesis at L5-S1 level   . Right-sided low back pain with right-sided sciatica      Follow-up as needed may discontinue gabapentin

## 2015-02-07 NOTE — Therapy (Signed)
[place], [place], [address] Phone: [phone]   Fax:  [phone]  Physical Therapy Treatment  Patient Details  Name: Diane Cohen MRN: [id number] Date of Birth: 22-Dec-1945 Referring Provider: Dr. Aline Brochure   Encounter Date: 02/07/2015      PT End of Session - 02/07/15 1452    Visit Number 5   Number of Visits 12   Date for PT Re-Evaluation 02/14/15   Authorization Type [employer] and [employer]    Authorization Time Period 01/17/15 to 03/19/15   Authorization - Visit Number 5   Authorization - Number of Visits 10   PT Start Time 1302   PT Stop Time 1342   PT Time Calculation (min) 40 min   Activity Tolerance Patient tolerated treatment well   Behavior During Therapy [facility] for tasks assessed/performed      Past Medical History  Diagnosis Date  . Atrial fibrillation   . Hypertension   . Hypothyroidism   . Pneumonia   . TIA (transient ischemic attack)   . Chronic diastolic heart failure     Past Surgical History  Procedure Laterality Date  . Abdominal hysterectomy    . Breast surgery    . Cholecystectomy    . Coloscopy      There were no vitals filed for this visit.  Visit Diagnosis:  Midline low back pain with right-sided sciatica  Poor posture  Muscle weakness  Decreased functional activity tolerance      Subjective Assessment - 02/07/15 1304    Subjective Patient reports that her foot is really bothering her today, her back is also feeling the effects of the weather as it has been on and off achey. She reports that her MD has told her the only thing left he can do for her foot is surgery and he would rather not do this due to the trouble she would have afterwards due to extensive surgery.    Pertinent History Patient reports that her back started hurting about 8 months ago, but it didn't really keep her from doing anything; Dr. Luan Pulling got images done of the back and found  anterolithessis in lumbar spine. Pain has gotten worse over time. She did receive a couple of shots in her back about a month ago.    Currently in Pain? Yes   Pain Score 2    Pain Location Back  foot 10/10 pain                          [facility] Adult PT Treatment/Exercise - 02/07/15 0001    Lumbar Exercises: Stretches   Active Hamstring Stretch 3 reps;30 seconds   Active Hamstring Stretch Limitations supine    Passive Hamstring Stretch 3 reps;30 seconds   Passive Hamstring Stretch Limitations slantboard    Piriformis Stretch 30 seconds;2 reps   Piriformis Stretch Limitations seated   Lumbar Exercises: Standing   Heel Raises 15 reps   Heel Raises Limitations toe and heel raises    Forward Lunge 10 reps   Forward Lunge Limitations onto 4" step   Side Lunge 10 reps;Other (comment)  4 inch box    Scapular Retraction Both;15 reps;Theraband   Theraband Level (Scapular Retraction) Level 3 (Green)   Row Both;15 reps   Theraband Level (Row) Level 3 (Green)   Shoulder Extension Both;15 reps   Theraband Level (Shoulder Extension) Level 3 (Green)   Other Standing Lumbar Exercises 3D hip excursions  1x10   Other Standing Lumbar Exercises standing fire extingusihers and dogs 1x10; standing hip abd walks 2x103ft    Lumbar Exercises: Supine   Ab Set 15 reps   AB Set Limitations 3 second holds    Bent Knee Raise 15 reps   Bridge 15 reps   Other Supine Lumbar Exercises pelvic clocks 12-6 and 3-9 1x10 each way                PT Education - 02/07/15 1452    Education provided No          PT Short Term Goals - 01/17/15 1831    PT SHORT TERM GOAL #1   Title Patient to be able to verbalize the improtance of improved posture and will be able to maintain correct posture at least 80% of the time in order to reduce pain and enhance functional task performance    Time 3   Period Weeks   Status New   PT SHORT TERM GOAL #2   Title Patient will be able to touch toes with  lumbar forward flexion, be able to reach past knee joint line bilaterally with lateral flexion in order to improve overall posture and mobility and reduce stiffness and pain    Time 3   Period Weeks   Status New   PT SHORT TERM GOAL #3   Title Patient will experience no more than 2/10 pain with functional activities and tasks in standing for at least 90 minutes, with no pain running down the back of her legs in order to enhance functional activity tolerance and task performance skills    Time 3   Period Weeks   Status New   PT SHORT TERM GOAL #4   Title Patient will be independent in correctly and consistently performing appropriate HEP, to be updated PRN    Time 3   Period Weeks   Status New           PT Long Term Goals - 01/17/15 1836    PT LONG TERM GOAL #1   Title Patient to demonstrate 5/5 strength in bilateral lower extremities and at least 4/5 strength in upper/lower core in order to improve functional task performance and reduce pain    Time 6   Period Weeks   Status New   PT LONG TERM GOAL #2   Title Patient to be able to lift at least 15 pounds while demonstrated correct functional mechanics in order to allow her to perform functional tasks around her home with minimal risk of re-injury    Time 6   Period Weeks   Status New   PT LONG TERM GOAL #3   Title Patient will regularly perform at least 20 mintues of light to moderate intensity physical activity at least 3 times per week in order to maintain functional gains and reduce risk of exacerbation of pain    Time 6   Period Weeks   Status New               Plan - 02/07/15 1453    Clinical Impression Statement Continued with functional exercises and stretching today, within patient's tolerance as she is having severe pain in her L foot today. Patient did require some modifications to exercises today due to foot pain but was able to complete majority of exercises well with min corrections of form. Did not perform  single leg stance work today due to foot pain.    Pt will benefit from skilled  therapeutic intervention in order to improve on the following deficits Abnormal gait;Decreased endurance;Hypomobility;Decreased activity tolerance;Decreased strength;Pain;Decreased balance;Decreased mobility;Difficulty walking;Improper body mechanics;Decreased coordination;Impaired flexibility;Postural dysfunction   Rehab Potential Good   PT Frequency 2x / week   PT Duration 6 weeks   PT Treatment/Interventions ADLs/Self Care Home Management;Moist Heat;Gait training;Stair training;Functional mobility training;Therapeutic activities;Therapeutic exercise;Balance training;Neuromuscular re-education;Patient/family education;Manual techniques   PT Next Visit Plan Progress functional strengtheing towards goals.     PT Home Exercise Plan given    Consulted and Agree with Plan of Care Patient        Problem List Patient Active Problem List   Diagnosis Date Noted  . Chronic diastolic heart failure ([place]) 12/29/2013  . Acute on chronic diastolic heart failure ([place]) 12/29/2013  . Anemia 09/20/2013  . Bilateral leg edema 08/29/2013  . Essential hypertension, benign   . Atrial fibrillation [facility])     Deniece Ree PT, DPT [place] [address] [place], [place], [address] Phone: [phone]   Fax:  [phone]  Name: Diane Cohen MRN: [id number] Date of Birth: [birth year]

## 2015-02-08 DIAGNOSIS — J449 Chronic obstructive pulmonary disease, unspecified: Secondary | ICD-10-CM | POA: Diagnosis not present

## 2015-02-08 DIAGNOSIS — Z23 Encounter for immunization: Secondary | ICD-10-CM | POA: Diagnosis not present

## 2015-02-08 DIAGNOSIS — I482 Chronic atrial fibrillation: Secondary | ICD-10-CM | POA: Diagnosis not present

## 2015-02-08 DIAGNOSIS — I10 Essential (primary) hypertension: Secondary | ICD-10-CM | POA: Diagnosis not present

## 2015-02-08 DIAGNOSIS — I129 Hypertensive chronic kidney disease with stage 1 through stage 4 chronic kidney disease, or unspecified chronic kidney disease: Secondary | ICD-10-CM | POA: Diagnosis not present

## 2015-02-11 ENCOUNTER — Encounter: Payer: Self-pay | Admitting: Obstetrics & Gynecology

## 2015-02-11 ENCOUNTER — Ambulatory Visit (INDEPENDENT_AMBULATORY_CARE_PROVIDER_SITE_OTHER): Payer: Medicare PPO | Admitting: Obstetrics & Gynecology

## 2015-02-11 ENCOUNTER — Ambulatory Visit (HOSPITAL_COMMUNITY): Payer: Medicare PPO | Admitting: Physical Therapy

## 2015-02-11 VITALS — BP 140/70 | HR 76 | Ht 61.0 in | Wt 192.0 lb

## 2015-02-11 DIAGNOSIS — M6281 Muscle weakness (generalized): Secondary | ICD-10-CM

## 2015-02-11 DIAGNOSIS — Z1212 Encounter for screening for malignant neoplasm of rectum: Secondary | ICD-10-CM

## 2015-02-11 DIAGNOSIS — Z01419 Encounter for gynecological examination (general) (routine) without abnormal findings: Secondary | ICD-10-CM

## 2015-02-11 DIAGNOSIS — Z124 Encounter for screening for malignant neoplasm of cervix: Secondary | ICD-10-CM | POA: Diagnosis not present

## 2015-02-11 DIAGNOSIS — Z1211 Encounter for screening for malignant neoplasm of colon: Secondary | ICD-10-CM

## 2015-02-11 DIAGNOSIS — R6889 Other general symptoms and signs: Secondary | ICD-10-CM

## 2015-02-11 DIAGNOSIS — M5441 Lumbago with sciatica, right side: Secondary | ICD-10-CM | POA: Diagnosis not present

## 2015-02-11 DIAGNOSIS — R293 Abnormal posture: Secondary | ICD-10-CM

## 2015-02-11 MED ORDER — ESTRADIOL 1 MG PO TABS: 1.0000 mg | ORAL_TABLET | Freq: Every day | ORAL | Status: DC

## 2015-02-11 NOTE — Therapy (Signed)
[place], [place], [address] Phone: [phone]   Fax:  [phone]  Physical Therapy Treatment  Patient Details  Name: Diane Cohen MRN: [id number] Date of Birth: [birth year] Referring Provider: Dr. Aline Brochure   Encounter Date: 02/11/2015      PT End of Session - 02/11/15 1540    Visit Number 6   Number of Visits 12   Date for PT Re-Evaluation 02/14/15   Authorization Type [employer] and [employer]    Authorization Time Period 01/17/15 to 03/19/15   Authorization - Visit Number 6   Authorization - Number of Visits 10   PT Start Time [id number]   PT Stop Time 1430   PT Time Calculation (min) 45 min   Activity Tolerance Patient tolerated treatment well   Behavior During Therapy [facility] for tasks assessed/performed      Past Medical History  Diagnosis Date  . Atrial fibrillation ([place])   . Hypertension   . Hypothyroidism   . Pneumonia   . TIA (transient ischemic attack)   . Chronic diastolic heart failure [facility])     Past Surgical History  Procedure Laterality Date  . Abdominal hysterectomy    . Breast surgery    . Cholecystectomy    . Coloscopy      There were no vitals filed for this visit.  Visit Diagnosis:  Midline low back pain with right-sided sciatica  Poor posture  Muscle weakness  Decreased functional activity tolerance      Subjective Assessment - 02/11/15 1351    Subjective Pt reports that she isn't having any pain today, she feels pretty good.    Currently in Pain? No/denies   Pain Score 0-No pain                         [facility] Adult PT Treatment/Exercise - 02/11/15 0001    Lumbar Exercises: Stretches   Active Hamstring Stretch 3 reps;30 seconds   Active Hamstring Stretch Limitations 12" box    Passive Hamstring Stretch 3 reps;30 seconds   Passive Hamstring Stretch Limitations slantboard    Piriformis Stretch 30 seconds;2 reps   Piriformis Stretch Limitations seated    Lumbar Exercises: Standing   Heel Raises 15 reps   Heel Raises Limitations toe and heel raises    Forward Lunge 15 reps   Forward Lunge Limitations onto 4" step   Side Lunge 15 reps  onto 4" box   Shoulder Extension Both;15 reps   Theraband Level (Shoulder Extension) Level 3 (Green)   Other Standing Lumbar Exercises oblique punches with GTB x 10 bilat   Other Standing Lumbar Exercises sidestepping with RTB x 2RT   Lumbar Exercises: Supine   Ab Set 15 reps   AB Set Limitations 3 second holds    Bent Knee Raise 15 reps   Bridge 15 reps   Lumbar Exercises: Sidelying   Clam 15 reps                  PT Short Term Goals - 01/17/15 1831    PT SHORT TERM GOAL #1   Title Patient to be able to verbalize the improtance of improved posture and will be able to maintain correct posture at least 80% of the time in order to reduce pain and enhance functional task performance    Time 3   Period Weeks   Status New   PT SHORT TERM GOAL #2   Title  Patient will be able to touch toes with lumbar forward flexion, be able to reach past knee joint line bilaterally with lateral flexion in order to improve overall posture and mobility and reduce stiffness and pain    Time 3   Period Weeks   Status New   PT SHORT TERM GOAL #3   Title Patient will experience no more than 2/10 pain with functional activities and tasks in standing for at least 90 minutes, with no pain running down the back of her legs in order to enhance functional activity tolerance and task performance skills    Time 3   Period Weeks   Status New   PT SHORT TERM GOAL #4   Title Patient will be independent in correctly and consistently performing appropriate HEP, to be updated PRN    Time 3   Period Weeks   Status New           PT Long Term Goals - 01/17/15 1836    PT LONG TERM GOAL #1   Title Patient to demonstrate 5/5 strength in bilateral lower extremities and at least 4/5 strength in upper/lower core in order to  improve functional task performance and reduce pain    Time 6   Period Weeks   Status New   PT LONG TERM GOAL #2   Title Patient to be able to lift at least 15 pounds while demonstrated correct functional mechanics in order to allow her to perform functional tasks around her home with minimal risk of re-injury    Time 6   Period Weeks   Status New   PT LONG TERM GOAL #3   Title Patient will regularly perform at least 20 mintues of light to moderate intensity physical activity at least 3 times per week in order to maintain functional gains and reduce risk of exacerbation of pain    Time 6   Period Weeks   Status New               Plan - 02/11/15 1544    Clinical Impression Statement Continued with functional core strengthening in today's treatment. Sidesteppng with theraband, sidelying clams, and oblique punches were added today to improve hip and abdominal strength. Pt required min verbal and tactile cueing to maintain proper form throughout oblique punches, was able to complete all therex without any c/o increased pain in back or in foot.    PT Next Visit Plan continue with core and hip strengthening        Problem List Patient Active Problem List   Diagnosis Date Noted  . Chronic diastolic heart failure ([place]) 12/29/2013  . Acute on chronic diastolic heart failure ([place]) 12/29/2013  . Anemia 09/20/2013  . Bilateral leg edema 08/29/2013  . Essential hypertension, benign   . Atrial fibrillation ([place])     Hilma Favors, PT, DPT [phone] 02/11/2015, 3:46 PM  [place] [address] [place], [place], [address] Phone: [phone]   Fax:  [phone]  Name: Diane Cohen MRN: [id number] Date of Birth: 04-12-1945

## 2015-02-11 NOTE — Progress Notes (Signed)
Patient ID: Diane Cohen, female   DOB: 1945-10-01, 69 y.o.   MRN: [id number]   Subjective:     Diane Cohen is a 69 y.o. female here for a routine exam.  No LMP recorded. Patient has had a hysterectomy. No obstetric history on file. Birth Control Method:  hysterectomy Menstrual Calendar(currently): none  Current complaints: none.   Current acute medical issues:  CHF   Recent Gynecologic History No LMP recorded. Patient has had a hysterectomy. Last Pap: na,   Last mammogram: 2015,  normal  Past Medical History  Diagnosis Date  . Atrial fibrillation   . Hypertension   . Hypothyroidism   . Pneumonia   . TIA (transient ischemic attack)   . Chronic diastolic heart failure     Past Surgical History  Procedure Laterality Date  . Abdominal hysterectomy    . Breast surgery    . Cholecystectomy    . Coloscopy      OB History    No data available      Social History   Social History  . Marital Status: Married    Spouse Name: N/A  . Number of Children: N/A  . Years of Education: N/A   Social History Main Topics  . Smoking status: Former Smoker -- 0.50 packs/day    Types: Cigarettes    Quit date: 01/22/1973  . Smokeless tobacco: Never Used  . Alcohol Use: No  . Drug Use: No  . Sexual Activity: Yes    Birth Control/ Protection: Surgical   Other Topics Concern  . Not on file   Social History Narrative    Family History  Problem Relation Age of Onset  . Hypertension    . Hypertension Father      Current outpatient prescriptions:  .  acetaminophen (TYLENOL) 500 MG tablet, Take 1,000 mg by mouth every 6 (six) hours as needed., Disp: , Rfl:  .  albuterol (PROVENTIL HFA;VENTOLIN HFA) 108 (90 BASE) MCG/ACT inhaler, Inhale 1-2 puffs into the lungs every 6 (six) hours as needed for wheezing or shortness of breath., Disp: , Rfl:  .  amiodarone (PACERONE) 200 MG tablet, Take 0.5 tablets (100 mg total) by mouth daily., Disp: 45 tablet, Rfl: 3 .  amLODipine (NORVASC)  5 MG tablet, Take 5 mg by mouth daily., Disp: , Rfl:  .  Calcium Carbonate-Vit D-Min (CALTRATE PLUS PO), Take by mouth 2 (two) times daily., Disp: , Rfl:  .  Cyanocobalamin (B-12 COMPLIANCE INJECTION IJ), Inject as directed., Disp: , Rfl:  .  DULoxetine (CYMBALTA) 60 MG capsule, TK 1 C PO QD, Disp: , Rfl: 6 .  estradiol (ESTRACE) 1 MG tablet, Take 1 tablet (1 mg total) by mouth at bedtime., Disp: 90 tablet, Rfl: 3 .  furosemide (LASIX) 40 MG tablet, 06/05/14 take 40 mg one day,then 20 mg the next,alternating doses, Disp: 45 tablet, Rfl: 6 .  gabapentin (NEURONTIN) 100 MG capsule, Take 1 capsule (100 mg total) by mouth 3 (three) times daily., Disp: 90 capsule, Rfl: 1 .  levothyroxine (SYNTHROID, LEVOTHROID) 100 MCG tablet, Take 100 mcg by mouth daily before breakfast., Disp: , Rfl:  .  lisinopril (PRINIVIL,ZESTRIL) 20 MG tablet, Take 1 tablet (20 mg total) by mouth daily., Disp: , Rfl:  .  magnesium oxide (MAG-OX) 400 MG tablet, Take 400 mg by mouth daily., Disp: , Rfl:  .  metoprolol succinate (TOPROL-XL) 100 MG 24 hr tablet, Take 100 mg by mouth daily. Take with or immediately following a meal., Disp: ,  Rfl:  .  Multiple Vitamin (MULTIVITAMIN) tablet, Take 1 tablet by mouth daily., Disp: , Rfl:  .  omeprazole (PRILOSEC) 40 MG capsule, Take 40 mg by mouth daily., Disp: , Rfl:  .  potassium chloride 20 MEQ TBCR, Take 20 mEq by mouth daily., Disp: 30 tablet, Rfl: 0 .  rizatriptan (MAXALT-MLT) 10 MG disintegrating tablet, Take 10 mg by mouth as needed for migraine. May repeat in 2 hours if needed, Disp: , Rfl:  .  XARELTO 20 MG TABS tablet, TAKE 1 TABLET BY MOUTH DAILY WITH SUPPER, Disp: 30 tablet, Rfl: 6  Review of Systems  Review of Systems  Constitutional: Negative for fever, chills, weight loss, malaise/fatigue and diaphoresis.  HENT: Negative for hearing loss, ear pain, nosebleeds, congestion, sore throat, neck pain, tinnitus and ear discharge.   Eyes: Negative for blurred vision, double  vision, photophobia, pain, discharge and redness.  Respiratory: Negative for cough, hemoptysis, sputum production, shortness of breath, wheezing and stridor.   Cardiovascular: Negative for chest pain, palpitations, orthopnea, claudication, leg swelling and PND.  Gastrointestinal: negative for abdominal pain. Negative for heartburn, nausea, vomiting, diarrhea, constipation, blood in stool and melena.  Genitourinary: Negative for dysuria, urgency, frequency, hematuria and flank pain.  Musculoskeletal: Negative for myalgias, back pain, joint pain and falls.  Skin: Negative for itching and rash.  Neurological: Negative for dizziness, tingling, tremors, sensory change, speech change, focal weakness, seizures, loss of consciousness, weakness and headaches.  Endo/Heme/Allergies: Negative for environmental allergies and polydipsia. Does not bruise/bleed easily.  Psychiatric/Behavioral: Negative for depression, suicidal ideas, hallucinations, memory loss and substance abuse. The patient is not nervous/anxious and does not have insomnia.        Objective:  Blood pressure 140/70, pulse 76, height 5\' 1"  (1.549 m), weight 192 lb (87.091 kg).   Physical Exam  Vitals reviewed. Constitutional: She is oriented to person, place, and time. She appears well-developed and well-nourished.  HENT:  Head: Normocephalic and atraumatic.        Right Ear: External ear normal.  Left Ear: External ear normal.  Nose: Nose normal.  Mouth/Throat: Oropharynx is clear and moist.  Eyes: Conjunctivae and EOM are normal. Pupils are equal, round, and reactive to light. Right eye exhibits no discharge. Left eye exhibits no discharge. No scleral icterus.  Neck: Normal range of motion. Neck supple. No tracheal deviation present. No thyromegaly present.  Cardiovascular: Normal rate, regular rhythm, normal heart sounds and intact distal pulses.  Exam reveals no gallop and no friction rub.   No murmur heard. Respiratory: Effort  normal and breath sounds normal. No respiratory distress. She has no wheezes. She has no rales. She exhibits no tenderness.  GI: Soft. Bowel sounds are normal. She exhibits no distension and no mass. There is no tenderness. There is no rebound and no guarding.  Genitourinary:  Breasts no masses skin changes or nipple changes bilaterally      Vulva is normal without lesions Vagina is pink moist without discharge Cervix absent Uterus is absent Adnexa is negative  {Rectal    hemoccult negative, normal tone, no masses  Musculoskeletal: Normal range of motion. She exhibits no edema and no tenderness.  Neurological: She is alert and oriented to person, place, and time. She has normal reflexes. She displays normal reflexes. No cranial nerve deficit. She exhibits normal muscle tone. Coordination normal.  Skin: Skin is warm and dry. No rash noted. No erythema. No pallor.  Psychiatric: She has a normal mood and affect. Her behavior is normal.  Judgment and thought content normal.       Assessment:    Healthy female exam.    Plan:    Mammogram ordered.   Follow up in 1year

## 2015-02-14 ENCOUNTER — Ambulatory Visit (HOSPITAL_COMMUNITY): Payer: Medicare PPO | Admitting: Physical Therapy

## 2015-02-14 DIAGNOSIS — M6281 Muscle weakness (generalized): Secondary | ICD-10-CM

## 2015-02-14 DIAGNOSIS — M5441 Lumbago with sciatica, right side: Secondary | ICD-10-CM | POA: Diagnosis not present

## 2015-02-14 DIAGNOSIS — R6889 Other general symptoms and signs: Secondary | ICD-10-CM | POA: Diagnosis not present

## 2015-02-14 DIAGNOSIS — R293 Abnormal posture: Secondary | ICD-10-CM

## 2015-02-14 NOTE — Patient Instructions (Signed)
   HIP ABDUCTION - SIDELYING  While lying on your side, slowly raise up your top leg to the side. Keep your knee straight and maintain your toes pointed forward the entire time.   The bottom leg can be bent to stabilize your body.  Repeat 10 times each leg, twice a day.     PRONE HIP EXTENSION  While lying face down with your knee straight, slowly raise up leg off the ground.  Repeat 10 times each leg, twice a day.     SEATED ALTERNATE ARM AND LEG   While sitting on a chair, raise up an arm and opposite leg. Do not let your pelvis or spine move while performing.   Repeat 5-10 times each leg, twice a day.     TANDEM STANCE WITH SUPPORT  Do this in the hallway and have a chair in front of you for safety. Then place the heel of one foot so that it is touching the toes of the other foot. Maintain your balance in this position.  Hold for as long as you can, then switch your feet. Repeat 3 times each side, twice a day.    SINGLE LEG STANCE - SLS  Stand on one leg and maintain your balance. Only do this with your R leg, don't worry about doing it with your left.   Hold for as long as you can, three times; do this twice a day.

## 2015-02-14 NOTE — Therapy (Signed)
[place], [place], [address] Phone: [phone]   Fax:  [phone]  Physical Therapy Treatment(Re-Assess/DC)  Patient Details  Name: Diane Cohen MRN: [id number] Date of Birth: 02-Apr-1945 Referring Provider: Dr. Aline Brochure   Encounter Date: 02/14/2015      PT End of Session - 02/14/15 1518    Visit Number 7   Number of Visits 12   Authorization Type [employer] and [employer]    Authorization Time Period 01/17/15 to 03/19/15   Authorization - Visit Number 7   Authorization - Number of Visits 10   PT Start Time 7371   PT Stop Time 1515   PT Time Calculation (min) 39 min   Activity Tolerance Patient tolerated treatment well   Behavior During Therapy [facility] for tasks assessed/performed      Past Medical History  Diagnosis Date  . Atrial fibrillation ([place])   . Hypertension   . Hypothyroidism   . Pneumonia   . TIA (transient ischemic attack)   . Chronic diastolic heart failure [facility])     Past Surgical History  Procedure Laterality Date  . Abdominal hysterectomy    . Breast surgery    . Cholecystectomy    . Coloscopy      There were no vitals filed for this visit.  Visit Diagnosis:  Midline low back pain with right-sided sciatica  Poor posture  Muscle weakness  Decreased functional activity tolerance      Subjective Assessment - 02/14/15 1439    Subjective Patient reports that she feels that she is doing very well, not having any pain today and reports that her MD is happy with her progress    Pertinent History Patient reports that her back started hurting about 8 months ago, but it didn't really keep her from doing anything; Dr. Luan Pulling got images done of the back and found anterolithessis in lumbar spine. Pain has gotten worse over time. She did receive a couple of shots in her back about a month ago.    How long can you sit comfortably? 12/8- 90-120 minutes    How long can you stand  comfortably? 12/8- foot is more of a limiting factor, can still only stand around 20 minutes    How long can you walk comfortably? 12/8- 129f    Patient Stated Goals reduce pain    Currently in Pain? No/denies            [facility] - 02/14/15 0001    Observation/Other Assessments   Focus on Therapeutic Outcomes (FOTO)  45% limited    AROM   Lumbar Flexion 75   Lumbar Extension 10   Lumbar - Right Side Bend fingers to midline of knee joint    Lumbar - Left Side Bend fingers to midline of knee joint    Lumbar - Right Rotation mild limitation    Lumbar - Left Rotation mild limitation    Strength   Overall Strength Comments upper core 4+/5, lower core remains 2+/5   Right Hip Flexion 3/5   Right Hip Extension 4/5   Right Hip ABduction 3/5   Left Hip Flexion 3+/5   Left Hip Extension 4/5   Left Hip ABduction 3/5   Right Knee Flexion 4+/5   Right Knee Extension 5/5   Left Knee Flexion 4+/5   Left Knee Extension 5/5   Right Ankle Dorsiflexion 5/5   Left Ankle Dorsiflexion 3+/5  pain limited  PT Education - 02/14/15 1517    Education provided Yes   Education Details progress with skilled PT services, DC today per patient request    Person(s) Educated Patient   Methods Explanation   Comprehension Verbalized understanding          PT Short Term Goals - 02/14/15 1453    PT SHORT TERM GOAL #1   Title Patient to be able to verbalize the improtance of improved posture and will be able to maintain correct posture at least 80% of the time in order to reduce pain and enhance functional task performance    Time 3   Period Weeks   Status On-going   PT SHORT TERM GOAL #2   Title Patient will be able to touch toes with lumbar forward flexion, be able to reach past knee joint line bilaterally with lateral flexion in order to improve overall posture and mobility and reduce stiffness and pain    Time 3   Period Weeks    Status Partially Met   PT SHORT TERM GOAL #3   Title Patient will experience no more than 2/10 pain with functional activities and tasks in standing for at least 90 minutes, with no pain running down the back of her legs in order to enhance functional activity tolerance and task performance skills    Baseline 12/8- unable to stand for 90 minutes due to foot pain, back is fine    Time 3   Period Weeks   Status Partially Met   PT SHORT TERM GOAL #4   Title Patient will be independent in correctly and consistently performing appropriate HEP, to be updated PRN    Time 3   Period Weeks   Status On-going           PT Long Term Goals - 02/14/15 1455    PT LONG TERM GOAL #1   Title Patient to demonstrate 5/5 strength in bilateral lower extremities and at least 4/5 strength in upper/lower core in order to improve functional task performance and reduce pain    Time 6   Period Weeks   Status Partially Met   PT LONG TERM GOAL #2   Title Patient to be able to lift at least 15 pounds while demonstrated correct functional mechanics in order to allow her to perform functional tasks around her home with minimal risk of re-injury    Time 6   Period Weeks   Status Achieved   PT LONG TERM GOAL #3   Title Patient will regularly perform at least 20 mintues of light to moderate intensity physical activity at least 3 times per week in order to maintain functional gains and reduce risk of exacerbation of pain    Baseline 12/8- foot is limiting factor                Plan - 02/14/15 1518    Clinical Impression Statement Re-assessment performed today. Patient shows significant improvement all across the board in areas of lumbar ROM and mobility, strength, and reduced pain; at this time patient reports that she is really donig every thing she needs and wants to do, and is most limited by her painful foot rather than her back. She requests and is appropriate for discharge today, and was given an  advanced HEP for use at home.    Pt will benefit from skilled therapeutic intervention in order to improve on the following deficits Abnormal gait;Decreased endurance;Hypomobility;Decreased activity tolerance;Decreased strength;Pain;Decreased balance;Decreased mobility;Difficulty walking;Improper body mechanics;Decreased coordination;Impaired  flexibility;Postural dysfunction   Rehab Potential Good   PT Treatment/Interventions ADLs/Self Care Home Management;Moist Heat;Gait training;Stair training;Functional mobility training;Therapeutic activities;Therapeutic exercise;Balance training;Neuromuscular re-education;Patient/family education;Manual techniques   PT Next Visit Plan DC today    PT Home Exercise Plan given    Consulted and Agree with Plan of Care Patient          G-Codes - 03-02-15 1520    Functional Assessment Tool Used FOTO 45% limited    Functional Limitation Mobility: Walking and moving around   Mobility: Walking and Moving Around Goal Status [phone]) At least 40 percent but less than 60 percent impaired, limited or restricted   Mobility: Walking and Moving Around Discharge Status [phone]) At least 40 percent but less than 60 percent impaired, limited or restricted      Problem List Patient Active Problem List   Diagnosis Date Noted  . Chronic diastolic heart failure ([place]) 12/29/2013  . Acute on chronic diastolic heart failure ([place]) 12/29/2013  . Anemia 09/20/2013  . Bilateral leg edema 08/29/2013  . Essential hypertension, benign   . Atrial fibrillation ([place])    PHYSICAL THERAPY DISCHARGE SUMMARY  Visits from Start of Care: 7  Current functional level related to goals / functional outcomes: Patient reports that she is able to do what she needs and wants, feels more limited by her foot than by her back; requests DC today.    Remaining deficits: Some lumbar stiffness, functional weakness, impaired balance    Education / Equipment: Advanced HEP  Plan: Patient agrees  to discharge.  Patient goals were partially met. Patient is being discharged due to being pleased with the current functional level.  ?????     Deniece Ree PT, DPT [place] [address] [place], [place], [address] Phone: [phone]   Fax:  [phone]  Name: EVERLINE MAHAFFY MRN: [id number] Date of Birth: [birth year]

## 2015-02-18 ENCOUNTER — Encounter (HOSPITAL_COMMUNITY): Payer: Self-pay | Admitting: Physical Therapy

## 2015-02-21 ENCOUNTER — Encounter (HOSPITAL_COMMUNITY): Payer: Self-pay | Admitting: Physical Therapy

## 2015-02-25 ENCOUNTER — Encounter (HOSPITAL_COMMUNITY): Payer: Self-pay | Admitting: Physical Therapy

## 2015-02-28 ENCOUNTER — Encounter (HOSPITAL_COMMUNITY): Payer: Self-pay | Admitting: Physical Therapy

## 2015-03-06 ENCOUNTER — Emergency Department (HOSPITAL_COMMUNITY): Payer: Medicare PPO

## 2015-03-06 ENCOUNTER — Encounter (HOSPITAL_COMMUNITY): Payer: Self-pay | Admitting: Emergency Medicine

## 2015-03-06 ENCOUNTER — Emergency Department (HOSPITAL_COMMUNITY)
Admission: EM | Admit: 2015-03-06 | Discharge: 2015-03-06 | Disposition: A | Discharge status: Home / Self Care | Payer: Medicare PPO | Attending: Emergency Medicine | Admitting: Emergency Medicine

## 2015-03-06 DIAGNOSIS — I10 Essential (primary) hypertension: Secondary | ICD-10-CM | POA: Diagnosis not present

## 2015-03-06 DIAGNOSIS — Z8701 Personal history of pneumonia (recurrent): Secondary | ICD-10-CM | POA: Diagnosis not present

## 2015-03-06 DIAGNOSIS — R609 Edema, unspecified: Secondary | ICD-10-CM | POA: Diagnosis not present

## 2015-03-06 DIAGNOSIS — Z79899 Other long term (current) drug therapy: Secondary | ICD-10-CM | POA: Diagnosis not present

## 2015-03-06 DIAGNOSIS — R600 Localized edema: Secondary | ICD-10-CM

## 2015-03-06 DIAGNOSIS — I4891 Unspecified atrial fibrillation: Secondary | ICD-10-CM | POA: Insufficient documentation

## 2015-03-06 DIAGNOSIS — I5032 Chronic diastolic (congestive) heart failure: Secondary | ICD-10-CM | POA: Insufficient documentation

## 2015-03-06 DIAGNOSIS — Z7901 Long term (current) use of anticoagulants: Secondary | ICD-10-CM | POA: Diagnosis not present

## 2015-03-06 DIAGNOSIS — R05 Cough: Secondary | ICD-10-CM | POA: Diagnosis not present

## 2015-03-06 DIAGNOSIS — Z8673 Personal history of transient ischemic attack (TIA), and cerebral infarction without residual deficits: Secondary | ICD-10-CM | POA: Diagnosis not present

## 2015-03-06 DIAGNOSIS — R2243 Localized swelling, mass and lump, lower limb, bilateral: Secondary | ICD-10-CM | POA: Diagnosis not present

## 2015-03-06 DIAGNOSIS — Z87891 Personal history of nicotine dependence: Secondary | ICD-10-CM | POA: Diagnosis not present

## 2015-03-06 DIAGNOSIS — E039 Hypothyroidism, unspecified: Secondary | ICD-10-CM | POA: Insufficient documentation

## 2015-03-06 LAB — BRAIN NATRIURETIC PEPTIDE: B NATRIURETIC PEPTIDE 5: 183 pg/mL — AB (ref 0.0–100.0)

## 2015-03-06 LAB — CBC
HCT: 39.2 % (ref 36.0–46.0)
Hemoglobin: 12.8 g/dL (ref 12.0–15.0)
MCH: 31.8 pg (ref 26.0–34.0)
MCHC: 32.7 g/dL (ref 30.0–36.0)
MCV: 97.3 fL (ref 78.0–100.0)
Platelets: 263 10*3/uL (ref 150–400)
RBC: 4.03 MIL/uL (ref 3.87–5.11)
RDW: 15.1 % (ref 11.5–15.5)
WBC: 5.4 10*3/uL (ref 4.0–10.5)

## 2015-03-06 LAB — BASIC METABOLIC PANEL
ANION GAP: 5 (ref 5–15)
BUN: 11 mg/dL (ref 6–20)
CALCIUM: 9.7 mg/dL (ref 8.9–10.3)
CO2: 31 mmol/L (ref 22–32)
CREATININE: 0.99 mg/dL (ref 0.44–1.00)
Chloride: 103 mmol/L (ref 101–111)
GFR, EST NON AFRICAN AMERICAN: 57 mL/min — AB (ref >60)
Glucose, Bld: 105 mg/dL — ABNORMAL HIGH (ref 65–99)
Potassium: 4 mmol/L (ref 3.5–5.1)
SODIUM: 139 mmol/L (ref 135–145)

## 2015-03-06 MED ORDER — FUROSEMIDE 20 MG PO TABS: 60.0000 mg | ORAL_TABLET | Freq: Every day | ORAL | Status: DC

## 2015-03-06 MED ORDER — FUROSEMIDE 40 MG PO TABS
60.0000 mg | ORAL_TABLET | Freq: Once | ORAL | Status: AC
  Administered 2015-03-06: 60 mg via ORAL
  Filled 2015-03-06: qty 2

## 2015-03-06 NOTE — Discharge Instructions (Signed)

## 2015-03-06 NOTE — ED Provider Notes (Signed)
CSN: [id number]     Arrival date & time 03/06/15  1303 History   First MD Initiated Contact with Patient 03/06/15 1633     Chief Complaint  Patient presents with  . Leg Swelling    HPI Pt has been having leg swelling for several weeks to a month.  Both legs are swollen.  She has had swelling in the past but it has never lasted this long.  She has noticed some mild shortness of breath with exertion.  No fever.  No chest pain.  No cough.     Pt takes lasix regularly.  She has not seen her doctor for this but decided to come into the ED today because it has not been getting any better. Past Medical History  Diagnosis Date  . Atrial fibrillation ([place])   . Hypertension   . Hypothyroidism   . Pneumonia   . TIA (transient ischemic attack)   . Chronic diastolic heart failure ([place])   . High cholesterol    Past Surgical History  Procedure Laterality Date  . Abdominal hysterectomy    . Breast surgery    . Cholecystectomy    . Coloscopy     Family History  Problem Relation Age of Onset  . Hypertension    . Hypertension Father    Social History  Substance Use Topics  . Smoking status: Former Smoker -- 0.50 packs/day    Types: Cigarettes    Quit date: 01/22/1973  . Smokeless tobacco: Never Used  . Alcohol Use: No   OB History    No data available     Review of Systems  All other systems reviewed and are negative.     Allergies  Levaquin and Sulfa antibiotics  Home Medications   Prior to Admission medications   Medication Sig Start Date End Date Taking? Authorizing Provider  acetaminophen (TYLENOL) 500 MG tablet Take 1,000 mg by mouth every 6 (six) hours as needed.    Historical Provider, MD  albuterol (PROVENTIL HFA;VENTOLIN HFA) 108 (90 BASE) MCG/ACT inhaler Inhale 1-2 puffs into the lungs every 6 (six) hours as needed for wheezing or shortness of breath.    Historical Provider, MD  amiodarone (PACERONE) 200 MG tablet Take 0.5 tablets (100 mg total) by mouth daily.  09/05/14   Satira Sark, MD  amLODipine (NORVASC) 5 MG tablet Take 5 mg by mouth daily.    Historical Provider, MD  Calcium Carbonate-Vit D-Min (CALTRATE PLUS PO) Take by mouth 2 (two) times daily.    Historical Provider, MD  Cyanocobalamin (B-12 COMPLIANCE INJECTION IJ) Inject as directed.    Historical Provider, MD  DULoxetine (CYMBALTA) 60 MG capsule TK 1 C PO QD 11/27/14   Historical Provider, MD  estradiol (ESTRACE) 1 MG tablet Take 1 tablet (1 mg total) by mouth at bedtime. 02/11/15   Florian Buff, MD  furosemide (LASIX) 20 MG tablet Take 3 tablets (60 mg total) by mouth daily. 03/06/15   Dorie Rank, MD  gabapentin (NEURONTIN) 100 MG capsule Take 1 capsule (100 mg total) by mouth 3 (three) times daily. 11/27/14   Carole Civil, MD  levothyroxine (SYNTHROID, LEVOTHROID) 100 MCG tablet Take 100 mcg by mouth daily before breakfast.    Historical Provider, MD  lisinopril (PRINIVIL,ZESTRIL) 20 MG tablet Take 1 tablet (20 mg total) by mouth daily. 04/05/13   Ripudeep Krystal Eaton, MD  magnesium oxide (MAG-OX) 400 MG tablet Take 400 mg by mouth daily.    Historical Provider, MD  metoprolol  succinate (TOPROL-XL) 100 MG 24 hr tablet Take 100 mg by mouth daily. Take with or immediately following a meal.    Historical Provider, MD  Multiple Vitamin (MULTIVITAMIN) tablet Take 1 tablet by mouth daily.    Historical Provider, MD  omeprazole (PRILOSEC) 40 MG capsule Take 40 mg by mouth daily.    Historical Provider, MD  potassium chloride 20 MEQ TBCR Take 20 mEq by mouth daily. 12/30/13   Nimish Luther Parody, MD  rizatriptan (MAXALT-MLT) 10 MG disintegrating tablet Take 10 mg by mouth as needed for migraine. May repeat in 2 hours if needed    Historical Provider, MD  XARELTO 20 MG TABS tablet TAKE 1 TABLET BY MOUTH DAILY WITH SUPPER 08/07/14   Satira Sark, MD   BP 159/72 mmHg  Pulse 77  Temp(Src) 98.8 F (37.1 C) (Oral)  Resp 18  Ht 5\' 1"  (1.549 m)  Wt 88.451 kg  BMI 36.86 kg/m2  SpO2 99% Physical  Exam  Constitutional: She appears well-developed and well-nourished. No distress.  HENT:  Head: Normocephalic and atraumatic.  Right Ear: External ear normal.  Left Ear: External ear normal.  Eyes: Conjunctivae are normal. Right eye exhibits no discharge. Left eye exhibits no discharge. No scleral icterus.  Neck: Neck supple. No tracheal deviation present.  Cardiovascular: Normal rate, regular rhythm and intact distal pulses.   Pulmonary/Chest: Effort normal and breath sounds normal. No stridor. No respiratory distress. She has no wheezes. She has no rales.  Abdominal: Soft. Bowel sounds are normal. She exhibits no distension. There is no tenderness. There is no rebound and no guarding.  Musculoskeletal: She exhibits edema. She exhibits no tenderness.  Bilateral pitting edema up to the knees, no erythema of her feet or calves bilaterally, no cords, normal pedal pulses bilaterally  Neurological: She is alert. She has normal strength. No cranial nerve deficit (no facial droop, extraocular movements intact, no slurred speech) or sensory deficit. She exhibits normal muscle tone. She displays no seizure activity. Coordination normal.  Skin: Skin is warm and dry. No rash noted.  Psychiatric: She has a normal mood and affect.  Nursing note and vitals reviewed.   ED Course  Procedures (including critical care time) Labs Review Labs Reviewed  BASIC METABOLIC PANEL - Abnormal; Notable for the following:    Glucose, Bld 105 (*)    GFR calc non Af Amer 57 (*)    All other components within normal limits  BRAIN NATRIURETIC PEPTIDE - Abnormal; Notable for the following:    B Natriuretic Peptide 183.0 (*)    All other components within normal limits  CBC    Imaging Review Dg Chest 2 View  03/06/2015  CLINICAL DATA:  Swelling in both legs for 3-4 weeks, CHF, shortness of breath, dry cough, hypertension, atrial fibrillation, chronic diastolic heart failure, former smoker EXAM: CHEST  2 VIEW  COMPARISON:  12/28/2013 FINDINGS: Minimal enlargement of cardiac silhouette. Atherosclerotic calcification aorta. Mediastinal contours and pulmonary vascularity normal. Slight rotation to the LEFT. No acute failure or consolidation. No pleural effusion or pneumothorax. Bones demineralized with accentuated thoracic kyphosis. IMPRESSION: Minimal enlargement of cardiac silhouette. No acute infiltrate. Electronically Signed   By: Lavonia Dana M.D.   On: 03/06/2015 13:35   I have personally reviewed and evaluated these images and lab results as part of my medical decision-making.   MDM   Final diagnoses:  Bilateral leg edema   patient's symptoms are consistent with peripheral edema without evidence of pulmonary edema. Chest x-ray is  reassuring as are her laboratory tests. She has a history of peripheral edema and is chronically on Lasix. The patient has been taking 40 mg daily. I will increase her dose to 60 mg and have her follow-up with her primary care doctor. I also try keeping her legs elevated and using compression stockings  At this time there does not appear to be any evidence of an acute emergency medical condition and the patient appears stable for discharge with appropriate outpatient follow up.     Dorie Rank, MD 03/06/15 [phone]

## 2015-03-06 NOTE — ED Notes (Signed)
Patient complaining of bilateral leg swelling x 1 month. States she has history of CHF. States "I am just a little bit short of breath." NAD noted.

## 2015-03-12 ENCOUNTER — Other Ambulatory Visit: Payer: Self-pay | Admitting: Cardiology

## 2015-03-15 ENCOUNTER — Ambulatory Visit (HOSPITAL_COMMUNITY)
Admission: RE | Admit: 2015-03-15 | Discharge: 2015-03-15 | Disposition: A | Discharge status: Home / Self Care | Payer: Medicare PPO | Source: Ambulatory Visit | Attending: Pulmonary Disease | Admitting: Pulmonary Disease

## 2015-03-15 ENCOUNTER — Other Ambulatory Visit (HOSPITAL_COMMUNITY): Payer: Self-pay | Admitting: Pulmonary Disease

## 2015-03-15 DIAGNOSIS — I482 Chronic atrial fibrillation: Secondary | ICD-10-CM | POA: Diagnosis not present

## 2015-03-15 DIAGNOSIS — J449 Chronic obstructive pulmonary disease, unspecified: Secondary | ICD-10-CM | POA: Diagnosis not present

## 2015-03-15 DIAGNOSIS — I10 Essential (primary) hypertension: Secondary | ICD-10-CM | POA: Diagnosis not present

## 2015-03-15 DIAGNOSIS — M79604 Pain in right leg: Secondary | ICD-10-CM | POA: Insufficient documentation

## 2015-03-15 DIAGNOSIS — M79605 Pain in left leg: Secondary | ICD-10-CM | POA: Insufficient documentation

## 2015-03-15 DIAGNOSIS — I5030 Unspecified diastolic (congestive) heart failure: Secondary | ICD-10-CM | POA: Diagnosis not present

## 2015-04-02 DIAGNOSIS — R0602 Shortness of breath: Secondary | ICD-10-CM | POA: Diagnosis not present

## 2015-04-02 DIAGNOSIS — R609 Edema, unspecified: Secondary | ICD-10-CM | POA: Diagnosis not present

## 2015-04-09 ENCOUNTER — Encounter: Payer: Self-pay | Admitting: Adult Health

## 2015-04-09 ENCOUNTER — Ambulatory Visit (INDEPENDENT_AMBULATORY_CARE_PROVIDER_SITE_OTHER): Payer: Medicare PPO | Admitting: Adult Health

## 2015-04-09 VITALS — BP 122/60 | HR 83 | Ht 61.0 in | Wt 199.0 lb

## 2015-04-09 DIAGNOSIS — I5189 Other ill-defined heart diseases: Secondary | ICD-10-CM

## 2015-04-09 DIAGNOSIS — I519 Heart disease, unspecified: Secondary | ICD-10-CM | POA: Diagnosis not present

## 2015-04-09 NOTE — Progress Notes (Signed)
Name: Diane Cohen    DOB: 1945/11/22  Age: 70 y.o.  MR#: [id number]       PCP:  Alonza Bogus, MD      Insurance: Payor: [employer] MEDICARE / Plan: [employer] MEDICARE CHOICE PPO / Product Type: *No Product type* /   CC:   No chief complaint on file.   VS Filed Vitals:   04/09/15 1423  BP: 122/60  Pulse: 83  Height: 5\' 1"  (1.549 m)  Weight: 199 lb (90.266 kg)  SpO2: 97%    Weights Current Weight  04/09/15 199 lb (90.266 kg)  03/06/15 195 lb (88.451 kg)  02/11/15 192 lb (87.091 kg)    Blood Pressure  BP Readings from Last 3 Encounters:  04/09/15 122/60  03/06/15 150/68  02/11/15 140/70     Admit date:  (Not on file) Last encounter with RMR:  Visit date not found   Allergy Levaquin and Sulfa antibiotics  Current Outpatient Prescriptions  Medication Sig Dispense Refill  . acetaminophen (TYLENOL) 500 MG tablet Take 1,000 mg by mouth every 6 (six) hours as needed.    Marland Kitchen albuterol (PROVENTIL HFA;VENTOLIN HFA) 108 (90 BASE) MCG/ACT inhaler Inhale 1-2 puffs into the lungs every 6 (six) hours as needed for wheezing or shortness of breath.    Marland Kitchen amiodarone (PACERONE) 200 MG tablet Take 0.5 tablets (100 mg total) by mouth daily. 45 tablet 3  . amLODipine (NORVASC) 5 MG tablet Take 5 mg by mouth daily.    . Calcium Carbonate-Vit D-Min (CALTRATE PLUS PO) Take by mouth 2 (two) times daily.    . Cyanocobalamin (B-12 COMPLIANCE INJECTION IJ) Inject as directed.    . DULoxetine (CYMBALTA) 60 MG capsule TK 1 C PO QD  6  . estradiol (ESTRACE) 1 MG tablet Take 1 tablet (1 mg total) by mouth at bedtime. 90 tablet 3  . gabapentin (NEURONTIN) 100 MG capsule Take 1 capsule (100 mg total) by mouth 3 (three) times daily. 90 capsule 1  . levothyroxine (SYNTHROID, LEVOTHROID) 100 MCG tablet Take 100 mcg by mouth daily before breakfast.    . lisinopril (PRINIVIL,ZESTRIL) 20 MG tablet Take 1 tablet (20 mg total) by mouth daily.    . magnesium oxide (MAG-OX) 400 MG tablet Take 400 mg by mouth daily.    .  metoprolol succinate (TOPROL-XL) 100 MG 24 hr tablet Take 100 mg by mouth daily. Take with or immediately following a meal.    . Multiple Vitamin (MULTIVITAMIN) tablet Take 1 tablet by mouth daily.    Marland Kitchen omeprazole (PRILOSEC) 40 MG capsule Take 40 mg by mouth daily.    . potassium chloride 20 MEQ TBCR Take 20 mEq by mouth daily. 30 tablet 0  . rizatriptan (MAXALT-MLT) 10 MG disintegrating tablet Take 10 mg by mouth as needed for migraine. May repeat in 2 hours if needed    . XARELTO 20 MG TABS tablet TAKE 1 TABLET BY MOUTH DAILY WITH DINNER 30 tablet 11  . torsemide (DEMADEX) 20 MG tablet      No current facility-administered medications for this visit.    Discontinued Meds:    Medications Discontinued During This Encounter  Medication Reason  . furosemide (LASIX) 20 MG tablet Error    Patient Active Problem List   Diagnosis Date Noted  . Chronic diastolic heart failure ([place]) 12/29/2013  . Acute on chronic diastolic heart failure ([place]) 12/29/2013  . Anemia 09/20/2013  . Bilateral leg edema 08/29/2013  . Essential hypertension, benign   . Atrial fibrillation ([place])  LABS    Component Value Date/Time   NA 139 03/06/2015 1420   NA 138 12/04/2014 0909   NA 142 05/31/2014 1201   NA 143 12/29/2013 0520   K 4.0 03/06/2015 1420   K 4.2 12/04/2014 0909   K 3.9 05/31/2014 1201   CL 103 03/06/2015 1420   CL 103 12/04/2014 0909   CL 100 05/31/2014 1201   CO2 31 03/06/2015 1420   CO2 33* 12/04/2014 0909   CO2 26 05/31/2014 1201   GLUCOSE 105* 03/06/2015 1420   GLUCOSE 84 12/04/2014 0909   GLUCOSE 79 05/31/2014 1201   GLUCOSE 84 12/29/2013 0520   BUN 11 03/06/2015 1420   BUN 16 12/04/2014 0909   BUN 15 05/31/2014 1201   BUN 18 12/29/2013 0520   CREATININE 0.99 03/06/2015 1420   CREATININE 0.96 12/04/2014 0909   CREATININE 1.18* 05/31/2014 1201   CREATININE 1.01 12/29/2013 0520   CREATININE 0.95 08/28/2013 1209   CALCIUM 9.7 03/06/2015 1420   CALCIUM 9.8 12/04/2014 0909    CALCIUM 9.4 05/31/2014 1201   GFRNONAA 57* 03/06/2015 1420   GFRNONAA 48* 05/31/2014 1201   GFRNONAA 56* 12/29/2013 0520   GFRAA >60 03/06/2015 1420   GFRAA 55* 05/31/2014 1201   GFRAA 65* 12/29/2013 0520   CMP     Component Value Date/Time   NA 139 03/06/2015 1420   NA 142 05/31/2014 1201   K 4.0 03/06/2015 1420   CL 103 03/06/2015 1420   CO2 31 03/06/2015 1420   GLUCOSE 105* 03/06/2015 1420   GLUCOSE 79 05/31/2014 1201   BUN 11 03/06/2015 1420   BUN 15 05/31/2014 1201   CREATININE 0.99 03/06/2015 1420   CREATININE 0.96 12/04/2014 0909   CALCIUM 9.7 03/06/2015 1420   PROT 6.7 12/04/2014 0909   PROT 7.0 05/31/2014 1201   ALBUMIN 3.8 12/04/2014 0909   ALBUMIN 4.4 05/31/2014 1201   AST 18 12/04/2014 0909   ALT 16 12/04/2014 0909   ALKPHOS 102 12/04/2014 0909   BILITOT 0.4 12/04/2014 0909   BILITOT 0.4 05/31/2014 1201   GFRNONAA 57* 03/06/2015 1420   GFRAA >60 03/06/2015 1420       Component Value Date/Time   WBC 5.4 03/06/2015 1420   WBC 5.6 12/04/2014 0909   WBC 4.7 05/31/2014 1201   WBC 5.5 12/28/2013 1500   HGB 12.8 03/06/2015 1420   HGB 12.7 12/04/2014 0909   HGB 12.1 05/31/2014 1201   HCT 39.2 03/06/2015 1420   HCT 37.4 12/04/2014 0909   HCT 36.3 05/31/2014 1201   MCV 97.3 03/06/2015 1420   MCV 93.7 12/04/2014 0909   MCV 96 05/31/2014 1201    Lipid Panel  No results found for: CHOL, TRIG, HDL, CHOLHDL, VLDL, LDLCALC, LDLDIRECT  ABG No results found for: PHART, PCO2ART, PO2ART, HCO3, TCO2, ACIDBASEDEF, O2SAT   Lab Results  Component Value Date   TSH 1.640 05/31/2014   BNP (last 3 results)  Recent Labs  03/06/15 1420  BNP 183.0*    ProBNP (last 3 results) No results for input(s): PROBNP in the last 8760 hours.  Cardiac Panel (last 3 results) No results for input(s): CKTOTAL, CKMB, TROPONINI, RELINDX in the last 72 hours.  Iron/TIBC/Ferritin/ %Sat No results found for: IRON, TIBC, FERRITIN, IRONPCTSAT   EKG Orders placed or performed in  visit on 12/04/14  . EKG 12-Lead     Prior Assessment and Plan Problem List as of 04/09/2015      Cardiovascular and Mediastinum   Essential hypertension, benign  Last Assessment & Plan 07/17/2013 Office Visit Written 07/17/2013  2:20 PM by Satira Sark, MD    Blood pressure is normal today.      Atrial fibrillation [facility])   Last Assessment & Plan 01/23/2014 Office Visit Written 01/23/2014  9:31 AM by Satira Sark, MD    Paroxysmal, well controlled on amiodarone and Xarelto.      Chronic diastolic heart failure [facility])   Last Assessment & Plan 01/23/2014 Office Visit Written 01/23/2014  9:30 AM by Satira Sark, MD    Clinically stable at this time. Refill provided for Lasix. We discussed doubling her dose intermittently for progressive leg edema or weight gain of 3 pounds in 24 hours. Sodium restriction also discussed. Follow-up arranged.      Acute on chronic diastolic heart failure [facility])     Other   Bilateral leg edema   Last Assessment & Plan 09/20/2013 Office Visit Edited 09/20/2013 10:20 AM by Satira Sark, MD    Increase Lasix to 80 mg daily for the next 3-5 days, then reduce back to 40 mg daily. If edema recurs we may need to increase her standing dose further. Also recommended sodium and fluid restriction.      Anemia   Last Assessment & Plan 09/20/2013 Office Visit Written 09/20/2013 10:22 AM by Satira Sark, MD    Followed by Dr. Luan Pulling.          Imaging: US Venous Img Lower Bilateral  03/15/2015  CLINICAL DATA:  Bilateral lower extremity pain. EXAM: BILATERAL LOWER EXTREMITY VENOUS DOPPLER ULTRASOUND TECHNIQUE: Gray-scale sonography with graded compression, as well as color Doppler and duplex ultrasound were performed to evaluate the lower extremity deep venous systems from the level of the common femoral vein and including the common femoral, femoral, profunda femoral, popliteal and calf veins including the posterior tibial, peroneal and  gastrocnemius veins when visible. The superficial great saphenous vein was also interrogated. Spectral Doppler was utilized to evaluate flow at rest and with distal augmentation maneuvers in the common femoral, femoral and popliteal veins. COMPARISON:  None. FINDINGS: RIGHT LOWER EXTREMITY Common Femoral Vein: No evidence of thrombus. Normal compressibility, respiratory phasicity and response to augmentation. Saphenofemoral Junction: No evidence of thrombus. Normal compressibility and flow on color Doppler imaging. Profunda Femoral Vein: No evidence of thrombus. Normal compressibility and flow on color Doppler imaging. Femoral Vein: No evidence of thrombus. Normal compressibility, respiratory phasicity and response to augmentation. Popliteal Vein: No evidence of thrombus. Normal compressibility, respiratory phasicity and response to augmentation. Calf Veins: No evidence of thrombus. Normal compressibility and flow on color Doppler imaging. Superficial Great Saphenous Vein: No evidence of thrombus. Normal compressibility and flow on color Doppler imaging. Other Findings:  None. LEFT LOWER EXTREMITY Common Femoral Vein: No evidence of thrombus. Normal compressibility, respiratory phasicity and response to augmentation. Saphenofemoral Junction: No evidence of thrombus. Normal compressibility and flow on color Doppler imaging. Profunda Femoral Vein: No evidence of thrombus. Normal compressibility and flow on color Doppler imaging. Femoral Vein: No evidence of thrombus. Normal compressibility, respiratory phasicity and response to augmentation. Popliteal Vein: No evidence of thrombus. Normal compressibility, respiratory phasicity and response to augmentation. Calf Veins: No evidence of thrombus. Normal compressibility and flow on color Doppler imaging. Superficial Great Saphenous Vein: No evidence of thrombus. Normal compressibility and flow on color Doppler imaging. Other Findings:  None. IMPRESSION: No evidence of deep  venous thrombosis. Electronically Signed   By: Marcello Moores  Register   On: 03/15/2015 14:17

## 2015-04-09 NOTE — Patient Instructions (Signed)
Your physician recommends that you schedule a follow-up appointment in: 1 Month with Dr. Domenic Polite  Your physician recommends that you continue on your current medications as directed. Please refer to the Current Medication list given to you today.  PLEASE TAKE TORSEMIDE (DEMADEX) ON AN EMPTY STOMACH.   Your physician has requested that you have an echocardiogram. Echocardiography is a painless test that uses sound waves to create images of your heart. It provides your doctor with information about the size and shape of your heart and how well your heart's chambers and valves are working. This procedure takes approximately one hour. There are no restrictions for this procedure.  If you need a refill on your cardiac medications before your next appointment, please call your pharmacy.  Thank you for choosing [facility]!

## 2015-04-09 NOTE — Progress Notes (Signed)
Cardiology Office Note   Date:  04/09/2015   ID:  Marguerette, Garski 1945-07-25, MRN [id number]  PCP:  Alonza Bogus, MD  Cardiologist: Delman Cheadle, NP   Chief Complaint  Patient presents with  . Congestive Heart Failure      History of Present Illness: Shakayla Hollrah Morian is a 70 y.o. female who presents for ongoing assessment and management of atrial fibrillation, hypertension, hypothyroidism, pneumonia, with history of chronic diastolic heart failure, and TIA.on last office visit he was maintaining normal sinus rhythm on low dose amiodarone and metoprolol.  He was continued on XARELTO for CHADS VASC Score of 4.    She comes today with complaints of lower extremity edema with more edema on the left than on the right.  She has been seen by Dr. Luan Pulling, who has changed her Lasix to torsemide 20 mg daily.  She has also had a Doppler ultrasound of the left leg to rule out DVT.she was also prescribed support hose.  She comes today complaining of her legs.  Continue be edematous, the left leg, more so than the right with some tenderness.  She is wearing the support hose.  She admits to eating salty foods including soups and crackers.  She says her weight when she is at her dry weight is 185 pounds, but that was approximately a year ago.  She states she has gained weight over the last few months.  She denies dyspnea on exertion, chest pain, or fatigue.  Past Medical History  Diagnosis Date  . Atrial fibrillation ([place])   . Hypertension   . Hypothyroidism   . Pneumonia   . TIA (transient ischemic attack)   . Chronic diastolic heart failure ([place])   . High cholesterol     Past Surgical History  Procedure Laterality Date  . Abdominal hysterectomy    . Breast surgery    . Cholecystectomy    . Coloscopy       Current Outpatient Prescriptions  Medication Sig Dispense Refill  . acetaminophen (TYLENOL) 500 MG tablet Take 1,000 mg by mouth every 6 (six) hours as needed.    Marland Kitchen  albuterol (PROVENTIL HFA;VENTOLIN HFA) 108 (90 BASE) MCG/ACT inhaler Inhale 1-2 puffs into the lungs every 6 (six) hours as needed for wheezing or shortness of breath.    Marland Kitchen amiodarone (PACERONE) 200 MG tablet Take 0.5 tablets (100 mg total) by mouth daily. 45 tablet 3  . amLODipine (NORVASC) 5 MG tablet Take 5 mg by mouth daily.    . Calcium Carbonate-Vit D-Min (CALTRATE PLUS PO) Take by mouth 2 (two) times daily.    . Cyanocobalamin (B-12 COMPLIANCE INJECTION IJ) Inject as directed.    . DULoxetine (CYMBALTA) 60 MG capsule TK 1 C PO QD  6  . estradiol (ESTRACE) 1 MG tablet Take 1 tablet (1 mg total) by mouth at bedtime. 90 tablet 3  . gabapentin (NEURONTIN) 100 MG capsule Take 1 capsule (100 mg total) by mouth 3 (three) times daily. 90 capsule 1  . levothyroxine (SYNTHROID, LEVOTHROID) 100 MCG tablet Take 100 mcg by mouth daily before breakfast.    . lisinopril (PRINIVIL,ZESTRIL) 20 MG tablet Take 1 tablet (20 mg total) by mouth daily.    . magnesium oxide (MAG-OX) 400 MG tablet Take 400 mg by mouth daily.    . metoprolol succinate (TOPROL-XL) 100 MG 24 hr tablet Take 100 mg by mouth daily. Take with or immediately following a meal.    . Multiple Vitamin (MULTIVITAMIN) tablet  Take 1 tablet by mouth daily.    Marland Kitchen omeprazole (PRILOSEC) 40 MG capsule Take 40 mg by mouth daily.    . potassium chloride 20 MEQ TBCR Take 20 mEq by mouth daily. 30 tablet 0  . rizatriptan (MAXALT-MLT) 10 MG disintegrating tablet Take 10 mg by mouth as needed for migraine. May repeat in 2 hours if needed    . XARELTO 20 MG TABS tablet TAKE 1 TABLET BY MOUTH DAILY WITH DINNER 30 tablet 11  . torsemide (DEMADEX) 20 MG tablet      No current facility-administered medications for this visit.    Allergies:   Levaquin and Sulfa antibiotics    Social History:  The patient  reports that she quit smoking about 42 years ago. Her smoking use included Cigarettes. She smoked 0.50 packs per day. She has never used smokeless tobacco.  She reports that she does not drink alcohol or use illicit drugs.   Family History:  The patient's family history includes Hypertension in her father.    ROS: All other systems are reviewed and negative. Unless otherwise mentioned in H&P    PHYSICAL EXAM: VS:  BP 122/60 mmHg  Pulse 83  Ht 5\' 1"  (1.549 m)  Wt 199 lb (90.266 kg)  BMI 37.62 kg/m2  SpO2 97% , BMI Body mass index is 37.62 kg/(m^2). GEN: Well nourished, well developed, in no acute distress HEENT: normal Neck: no JVD, carotid bruits, or masses Cardiac: RRR; no murmurs, rubs, or gallops,bilateral edema, 1+ on the right 2+ on the left around the ankle and foot.  Respiratory:  Clear to auscultation bilaterally, normal work of breathing GI: soft, nontender, nondistended, + BS MS: no deformity or atrophy Skin: warm and dry, no rash Neuro:  Strength and sensation are intact Psych: euthymic mood, full affect  Recent Labs: 05/31/2014: TSH 1.640 12/04/2014: ALT 16 03/06/2015: B Natriuretic Peptide 183.0*; BUN 11; Creatinine, Ser 0.99; Hemoglobin 12.8; Platelets 263; Potassium 4.0; Sodium 139    Lipid Panel No results found for: CHOL, TRIG, HDL, CHOLHDL, VLDL, LDLCALC, LDLDIRECT    Wt Readings from Last 3 Encounters:  04/09/15 199 lb (90.266 kg)  03/06/15 195 lb (88.451 kg)  02/11/15 192 lb (87.091 kg)      ASSESSMENT AND PLAN:  1. Paroxysmal atrial fibrillation: Heart rate is regular today.  She states on occasion.  She has had to take an additional dose of amiodarone.  If her heart is racing or fluttering, but this has occurred only once or twice.  Otherwise, she has been asymptomatic with this. She continues on XARELTO 20 mg daily.  No evidence of bleeding or complaints of hemoptysis or melena.  2.Diastolic CHF/ Chronic lower extremity edema: she has been started on torsemide, in the setting of chronic diastolic heart failure.  She is not seeing any results from this now.  She is taking the medication on a full  stomach.  I have advised her to take it on an empty stomach and wait 30 minutes before eating.  She continue to wear her TED hose, to avoid salty foods, and to raise her legs for several minutes after taking the torsemide.  She is to continue current medication regimen.  If she is not finding any results from this, we may need to increase her torsemide, slightly.  We will have to repeat labs prior to doing so.  We will see her in one month.  3. Hypertension:blood pressure is well-controlled.  She is on amlodipine, which can cause some lower  extremity edema in the dependent position.  I explained this to her as well.   Current medicines are reviewed at length with the patient today.    Labs/ tests ordered today include: none  Orders Placed This Encounter  Procedures  . Echocardiogram     Disposition:   FU with 1 month.   Signed, Jory Sims, NP  04/09/2015 3:06 PM    [facility] [address], [place],  [address] Phone: [phone]; Fax: [phone]

## 2015-04-11 ENCOUNTER — Other Ambulatory Visit (HOSPITAL_COMMUNITY): Payer: Self-pay

## 2015-04-11 ENCOUNTER — Ambulatory Visit (HOSPITAL_COMMUNITY)
Admission: RE | Admit: 2015-04-11 | Discharge: 2015-04-11 | Disposition: A | Discharge status: Home / Self Care | Payer: Medicare PPO | Source: Ambulatory Visit | Attending: Adult Health | Admitting: Adult Health

## 2015-04-11 DIAGNOSIS — Q248 Other specified congenital malformations of heart: Secondary | ICD-10-CM | POA: Diagnosis not present

## 2015-04-11 DIAGNOSIS — I071 Rheumatic tricuspid insufficiency: Secondary | ICD-10-CM | POA: Diagnosis not present

## 2015-04-11 DIAGNOSIS — I340 Nonrheumatic mitral (valve) insufficiency: Secondary | ICD-10-CM | POA: Diagnosis not present

## 2015-04-11 DIAGNOSIS — I519 Heart disease, unspecified: Secondary | ICD-10-CM | POA: Diagnosis not present

## 2015-04-11 DIAGNOSIS — I517 Cardiomegaly: Secondary | ICD-10-CM | POA: Insufficient documentation

## 2015-04-11 DIAGNOSIS — I5189 Other ill-defined heart diseases: Secondary | ICD-10-CM | POA: Insufficient documentation

## 2015-04-11 DIAGNOSIS — I10 Essential (primary) hypertension: Secondary | ICD-10-CM | POA: Diagnosis not present

## 2015-04-15 ENCOUNTER — Telehealth: Payer: Self-pay

## 2015-04-15 ENCOUNTER — Other Ambulatory Visit: Payer: Self-pay

## 2015-04-15 DIAGNOSIS — J449 Chronic obstructive pulmonary disease, unspecified: Secondary | ICD-10-CM | POA: Diagnosis not present

## 2015-04-15 DIAGNOSIS — I10 Essential (primary) hypertension: Secondary | ICD-10-CM | POA: Diagnosis not present

## 2015-04-15 DIAGNOSIS — Z79899 Other long term (current) drug therapy: Secondary | ICD-10-CM

## 2015-04-15 DIAGNOSIS — I129 Hypertensive chronic kidney disease with stage 1 through stage 4 chronic kidney disease, or unspecified chronic kidney disease: Secondary | ICD-10-CM | POA: Diagnosis not present

## 2015-04-15 DIAGNOSIS — I5030 Unspecified diastolic (congestive) heart failure: Secondary | ICD-10-CM | POA: Diagnosis not present

## 2015-04-15 MED ORDER — POTASSIUM CHLORIDE ER 20 MEQ PO TBCR: 40.0000 meq | EXTENDED_RELEASE_TABLET | Freq: Every day | ORAL | Status: DC

## 2015-04-15 MED ORDER — TORSEMIDE 20 MG PO TABS: 40.0000 mg | ORAL_TABLET | Freq: Every morning | ORAL | Status: DC

## 2015-04-15 NOTE — Telephone Encounter (Signed)
She can increase torsemide to 40 mg daily but also increase potassium to 40 mEq as well. Repeat her BMET in 4 days.

## 2015-04-15 NOTE — Telephone Encounter (Signed)
Lmtcb. 2/6- lm

## 2015-04-15 NOTE — Telephone Encounter (Signed)
This is y'alls ( it was routed to me by mistake at your office- please handle)

## 2015-04-15 NOTE — Telephone Encounter (Signed)
-----   Message from Lendon Colonel, NP sent at 04/12/2015  6:46 AM EST ----- Normal EF on echo but grade II diastolic dysfunction. Should continue low sodium diet and torsemide. Hopefully taking on empty stomach is helping her. IF she is not seeing any more diureses please let me know.

## 2015-04-15 NOTE — Telephone Encounter (Signed)
Pt made aware of results to echo but states that the torsemide has not really helped much. States that she cannot get the fluid in her legs to go away, even wearing compression stockings. She was wondering what else she could try. Please advise.

## 2015-04-15 NOTE — Telephone Encounter (Signed)
Told pt to increase torsemide to 40 mg daiyl & potassium 40 meq daily. Sent in new rx. Put order IN FOR LAB WORK. MAILED LAB SLIP. Pt states that she will repeat lab work in 4 days.

## 2015-04-15 NOTE — Addendum Note (Signed)
Addended by: Debbora Lacrosse R on: 04/15/2015 02:34 PM   Modules accepted: Orders

## 2015-04-23 DIAGNOSIS — Z79899 Other long term (current) drug therapy: Secondary | ICD-10-CM | POA: Diagnosis not present

## 2015-04-24 LAB — BASIC METABOLIC PANEL
BUN: 16 mg/dL (ref 7–25)
CALCIUM: 9.8 mg/dL (ref 8.6–10.4)
CO2: 28 mmol/L (ref 20–31)
Chloride: 98 mmol/L (ref 98–110)
Creat: 1.23 mg/dL — ABNORMAL HIGH (ref 0.50–0.99)
GLUCOSE: 81 mg/dL (ref 65–99)
Potassium: 4.3 mmol/L (ref 3.5–5.3)
SODIUM: 136 mmol/L (ref 135–146)

## 2015-04-25 ENCOUNTER — Telehealth: Payer: Self-pay

## 2015-04-25 DIAGNOSIS — Z79899 Other long term (current) drug therapy: Secondary | ICD-10-CM

## 2015-04-25 NOTE — Telephone Encounter (Signed)
-----   Message from Lendon Colonel, NP sent at 04/25/2015  6:51 AM EST ----- Reviewed. Slightly elevated creatinine. Will watch. No changes. BMET before next appointment.

## 2015-04-25 NOTE — Telephone Encounter (Signed)
Spoke with pt, she is aware of results. Put lab orders in and mailed lab slip.

## 2015-04-29 ENCOUNTER — Telehealth: Payer: Self-pay | Admitting: Cardiology

## 2015-04-29 NOTE — Telephone Encounter (Signed)
Errror/tg

## 2015-04-30 ENCOUNTER — Encounter: Payer: Self-pay | Admitting: Adult Health

## 2015-04-30 ENCOUNTER — Ambulatory Visit: Payer: Self-pay | Admitting: Adult Health

## 2015-04-30 ENCOUNTER — Telehealth: Payer: Self-pay

## 2015-04-30 ENCOUNTER — Ambulatory Visit (INDEPENDENT_AMBULATORY_CARE_PROVIDER_SITE_OTHER): Payer: Medicare PPO | Admitting: Adult Health

## 2015-04-30 VITALS — BP 122/72 | HR 80 | Ht 61.0 in | Wt 187.0 lb

## 2015-04-30 DIAGNOSIS — R002 Palpitations: Secondary | ICD-10-CM | POA: Diagnosis not present

## 2015-04-30 DIAGNOSIS — I4891 Unspecified atrial fibrillation: Secondary | ICD-10-CM

## 2015-04-30 NOTE — Patient Instructions (Signed)
Your physician recommends that you schedule a follow-up appointment in: 1 months with Dr Domenic Polite   Your physician recommends that you continue on your current medications as directed. Please refer to the Current Medication list given to you today.    Your physician has recommended that you wear an event monitor. Event monitors are medical devices that record the heart's electrical activity. Doctors most often [place] these monitors to diagnose arrhythmias. Arrhythmias are problems with the speed or rhythm of the heartbeat. The monitor is a small, portable device. You can wear one while you do your normal daily activities. This is usually used to diagnose what is causing palpitations/syncope (passing out).      Thank you for choosing [facility] !

## 2015-04-30 NOTE — Progress Notes (Deleted)
Name: KEKELI ORTMAN    DOB: Jan 30, 1946  Age: 70 y.o.  MR#: [id number]       PCP:  Alonza Bogus, MD      Insurance: Payor: [employer] MEDICARE / Plan: HU[employer] MEDICARE CHOICE PPO / Product Type: *No Product type* /   CC:    Chief Complaint  Patient presents with  . Congestive Heart Failure  . Atrial Fibrillation    VS Filed Vitals:   04/30/15 1311  BP: 122/72  Pulse: 80  Height: 5\' 1"  (1.549 m)  Weight: 187 lb (84.823 kg)  SpO2: 92%    Weights Current Weight  04/30/15 187 lb (84.823 kg)  04/09/15 199 lb (90.266 kg)  03/06/15 195 lb (88.451 kg)    Blood Pressure  BP Readings from Last 3 Encounters:  04/30/15 122/72  04/09/15 122/60  03/06/15 150/68     Admit date:  (Not on file) Last encounter with RMR:  04/09/2015   Allergy Levaquin and Sulfa antibiotics  Current Outpatient Prescriptions  Medication Sig Dispense Refill  . acetaminophen (TYLENOL) 500 MG tablet Take 1,000 mg by mouth every 6 (six) hours as needed.    Marland Kitchen albuterol (PROVENTIL HFA;VENTOLIN HFA) 108 (90 BASE) MCG/ACT inhaler Inhale 1-2 puffs into the lungs every 6 (six) hours as needed for wheezing or shortness of breath.    Marland Kitchen amiodarone (PACERONE) 200 MG tablet Take 0.5 tablets (100 mg total) by mouth daily. 45 tablet 3  . amLODipine (NORVASC) 5 MG tablet Take 5 mg by mouth daily.    . Calcium Carbonate-Vit D-Min (CALTRATE PLUS PO) Take by mouth 2 (two) times daily.    . Cyanocobalamin (B-12 COMPLIANCE INJECTION IJ) Inject as directed.    . DULoxetine (CYMBALTA) 60 MG capsule TK 1 C PO QD  6  . estradiol (ESTRACE) 1 MG tablet Take 1 tablet (1 mg total) by mouth at bedtime. 90 tablet 3  . levothyroxine (SYNTHROID, LEVOTHROID) 100 MCG tablet Take 100 mcg by mouth daily before breakfast.    . lisinopril (PRINIVIL,ZESTRIL) 20 MG tablet Take 1 tablet (20 mg total) by mouth daily.    . magnesium oxide (MAG-OX) 400 MG tablet Take 400 mg by mouth daily.    . metoprolol succinate (TOPROL-XL) 100 MG 24 hr tablet Take  100 mg by mouth daily. Take with or immediately following a meal.    . Multiple Vitamin (MULTIVITAMIN) tablet Take 1 tablet by mouth daily.    . Potassium Chloride ER 20 MEQ TBCR Take 40 mEq by mouth daily. 30 tablet 3  . torsemide (DEMADEX) 20 MG tablet Take 2 tablets (40 mg total) by mouth every morning. 60 tablet 3  . XARELTO 20 MG TABS tablet TAKE 1 TABLET BY MOUTH DAILY WITH DINNER 30 tablet 11  . rizatriptan (MAXALT-MLT) 10 MG disintegrating tablet Take 10 mg by mouth as needed for migraine. Reported on 04/30/2015     No current facility-administered medications for this visit.    Discontinued Meds:    Medications Discontinued During This Encounter  Medication Reason  . gabapentin (NEURONTIN) 100 MG capsule Error  . omeprazole (PRILOSEC) 40 MG capsule Error    Patient Active Problem List   Diagnosis Date Noted  . Chronic diastolic heart failure ([place]) 12/29/2013  . Acute on chronic diastolic heart failure ([place]) 12/29/2013  . Anemia 09/20/2013  . Bilateral leg edema 08/29/2013  . Essential hypertension, benign   . Atrial fibrillation ([facility])     LABS    Component Value Date/Time  NA 136 04/23/2015 1444   NA 139 03/06/2015 1420   NA 138 12/04/2014 0909   NA 142 05/31/2014 1201   K 4.3 04/23/2015 1444   K 4.0 03/06/2015 1420   K 4.2 12/04/2014 0909   CL 98 04/23/2015 1444   CL 103 03/06/2015 1420   CL 103 12/04/2014 0909   CO2 28 04/23/2015 1444   CO2 31 03/06/2015 1420   CO2 33* 12/04/2014 0909   GLUCOSE 81 04/23/2015 1444   GLUCOSE 105* 03/06/2015 1420   GLUCOSE 84 12/04/2014 0909   GLUCOSE 79 05/31/2014 1201   BUN 16 04/23/2015 1444   BUN 11 03/06/2015 1420   BUN 16 12/04/2014 0909   BUN 15 05/31/2014 1201   CREATININE 1.23* 04/23/2015 1444   CREATININE 0.99 03/06/2015 1420   CREATININE 0.96 12/04/2014 0909   CREATININE 1.18* 05/31/2014 1201   CREATININE 1.01 12/29/2013 0520   CREATININE 0.95 08/28/2013 1209   CALCIUM 9.8 04/23/2015 1444   CALCIUM 9.7  03/06/2015 1420   CALCIUM 9.8 12/04/2014 0909   GFRNONAA 57* 03/06/2015 1420   GFRNONAA 48* 05/31/2014 1201   GFRNONAA 56* 12/29/2013 0520   GFRAA >60 03/06/2015 1420   GFRAA 55* 05/31/2014 1201   GFRAA 65* 12/29/2013 0520   CMP     Component Value Date/Time   NA 136 04/23/2015 1444   NA 142 05/31/2014 1201   K 4.3 04/23/2015 1444   CL 98 04/23/2015 1444   CO2 28 04/23/2015 1444   GLUCOSE 81 04/23/2015 1444   GLUCOSE 79 05/31/2014 1201   BUN 16 04/23/2015 1444   BUN 15 05/31/2014 1201   CREATININE 1.23* 04/23/2015 1444   CREATININE 0.99 03/06/2015 1420   CALCIUM 9.8 04/23/2015 1444   PROT 6.7 12/04/2014 0909   PROT 7.0 05/31/2014 1201   ALBUMIN 3.8 12/04/2014 0909   ALBUMIN 4.4 05/31/2014 1201   AST 18 12/04/2014 0909   ALT 16 12/04/2014 0909   ALKPHOS 102 12/04/2014 0909   BILITOT 0.4 12/04/2014 0909   BILITOT 0.4 05/31/2014 1201   GFRNONAA 57* 03/06/2015 1420   GFRAA >60 03/06/2015 1420       Component Value Date/Time   WBC 5.4 03/06/2015 1420   WBC 5.6 12/04/2014 0909   WBC 4.7 05/31/2014 1201   WBC 5.5 12/28/2013 1500   HGB 12.8 03/06/2015 1420   HGB 12.7 12/04/2014 0909   HGB 12.1 05/31/2014 1201   HCT 39.2 03/06/2015 1420   HCT 37.4 12/04/2014 0909   HCT 36.3 05/31/2014 1201   MCV 97.3 03/06/2015 1420   MCV 93.7 12/04/2014 0909   MCV 96 05/31/2014 1201    Lipid Panel  No results found for: CHOL, TRIG, HDL, CHOLHDL, VLDL, LDLCALC, LDLDIRECT  ABG No results found for: PHART, PCO2ART, PO2ART, HCO3, TCO2, ACIDBASEDEF, O2SAT   Lab Results  Component Value Date   TSH 1.640 05/31/2014   BNP (last 3 results)  Recent Labs  03/06/15 1420  BNP 183.0*    ProBNP (last 3 results) No results for input(s): PROBNP in the last 8760 hours.  Cardiac Panel (last 3 results) No results for input(s): CKTOTAL, CKMB, TROPONINI, RELINDX in the last 72 hours.  Iron/TIBC/Ferritin/ %Sat No results found for: IRON, TIBC, FERRITIN, IRONPCTSAT   EKG Orders placed  or performed in visit on 12/04/14  . EKG 12-Lead     Prior Assessment and Plan Problem List as of 04/30/2015      Cardiovascular and Mediastinum   Essential hypertension, benign   Last  Assessment & Plan 07/17/2013 Office Visit Written 07/17/2013  2:20 PM by Satira Sark, MD    Blood pressure is normal today.      Atrial fibrillation [facility])   Last Assessment & Plan 01/23/2014 Office Visit Written 01/23/2014  9:31 AM by Satira Sark, MD    Paroxysmal, well controlled on amiodarone and Xarelto.      Chronic diastolic heart failure [facility])   Last Assessment & Plan 01/23/2014 Office Visit Written 01/23/2014  9:30 AM by Satira Sark, MD    Clinically stable at this time. Refill provided for Lasix. We discussed doubling her dose intermittently for progressive leg edema or weight gain of 3 pounds in 24 hours. Sodium restriction also discussed. Follow-up arranged.      Acute on chronic diastolic heart failure [facility])     Other   Bilateral leg edema   Last Assessment & Plan 09/20/2013 Office Visit Edited 09/20/2013 10:20 AM by Satira Sark, MD    Increase Lasix to 80 mg daily for the next 3-5 days, then reduce back to 40 mg daily. If edema recurs we may need to increase her standing dose further. Also recommended sodium and fluid restriction.      Anemia   Last Assessment & Plan 09/20/2013 Office Visit Written 09/20/2013 10:22 AM by Satira Sark, MD    Followed by Dr. Luan Pulling.          Imaging: No results found.

## 2015-04-30 NOTE — Telephone Encounter (Signed)
[employer] 14 day event monitor

## 2015-04-30 NOTE — Progress Notes (Signed)
Cardiology Office Note   Date:  04/30/2015   ID:  [name], [name] [birth year], MRN [id number]  PCP:  Alonza Bogus, MD  Cardiologist: [place]/  Jory Sims, NP   Chief Complaint  Patient presents with  . Congestive Heart Failure  . Atrial Fibrillation      History of Present Illness: Diane Cohen is a 70 y.o. female who presents for ongoing assessment and management of atrial fibrillation, hypertension, hypothyroidism, chronic diastolic heart failure,CHADS VASC Score of 4 on Xarelto. I last office visit on 04/09/2015.  She was complaining of lower extremity edema, she had been ruled out for DVT, and prescribed, support hose.  She was changed to torsemide, advised taking the medication on an empty stomach, she was to continue to wear her support hose, avoid salty foods, and to raise her legs for several minutes after taking diuretic.  Repeat labs revealed sodium of 136, potassium of 4.3, chloride 98, CO2 28, BUN 16, creatinine 1.23. Creatinine more elevated then on 03/06/2015 at 0.99.  She comes today with complaints of weakness, feeling her heart flutter, with one episode over the weekend, where she felt her heart skipping a lot, and she took an extra dose of amiodarone.  She is also recently been having some GI issues with chronic constipation.  She states that she has been feeling down.  She denies chest pain or dyspnea.  In   Past Medical History  Diagnosis Date  . Atrial fibrillation ([place])   . Hypertension   . Hypothyroidism   . Pneumonia   . TIA (transient ischemic attack)   . Chronic diastolic heart failure ([place])   . High cholesterol     Past Surgical History  Procedure Laterality Date  . Abdominal hysterectomy    . Breast surgery    . Cholecystectomy    . Coloscopy       Current Outpatient Prescriptions  Medication Sig Dispense Refill  . acetaminophen (TYLENOL) 500 MG tablet Take 1,000 mg by mouth every 6 (six) hours as needed.    Marland Kitchen albuterol (PROVENTIL  HFA;VENTOLIN HFA) 108 (90 BASE) MCG/ACT inhaler Inhale 1-2 puffs into the lungs every 6 (six) hours as needed for wheezing or shortness of breath.    Marland Kitchen amiodarone (PACERONE) 200 MG tablet Take 0.5 tablets (100 mg total) by mouth daily. 45 tablet 3  . amLODipine (NORVASC) 5 MG tablet Take 5 mg by mouth daily.    . Calcium Carbonate-Vit D-Min (CALTRATE PLUS PO) Take by mouth 2 (two) times daily.    . Cyanocobalamin (B-12 COMPLIANCE INJECTION IJ) Inject as directed.    . DULoxetine (CYMBALTA) 60 MG capsule TK 1 C PO QD  6  . estradiol (ESTRACE) 1 MG tablet Take 1 tablet (1 mg total) by mouth at bedtime. 90 tablet 3  . levothyroxine (SYNTHROID, LEVOTHROID) 100 MCG tablet Take 100 mcg by mouth daily before breakfast.    . lisinopril (PRINIVIL,ZESTRIL) 20 MG tablet Take 1 tablet (20 mg total) by mouth daily.    . magnesium oxide (MAG-OX) 400 MG tablet Take 400 mg by mouth daily.    . metoprolol succinate (TOPROL-XL) 100 MG 24 hr tablet Take 100 mg by mouth daily. Take with or immediately following a meal.    . Multiple Vitamin (MULTIVITAMIN) tablet Take 1 tablet by mouth daily.    . Potassium Chloride ER 20 MEQ TBCR Take 40 mEq by mouth daily. 30 tablet 3  . torsemide (DEMADEX) 20 MG tablet Take 2 tablets (40 mg  total) by mouth every morning. 60 tablet 3  . XARELTO 20 MG TABS tablet TAKE 1 TABLET BY MOUTH DAILY WITH DINNER 30 tablet 11  . rizatriptan (MAXALT-MLT) 10 MG disintegrating tablet Take 10 mg by mouth as needed for migraine. Reported on 04/30/2015     No current facility-administered medications for this visit.    Allergies:   Levaquin and Sulfa antibiotics    Social History:  The patient  reports that she quit smoking about 42 years ago. Her smoking use included Cigarettes. She smoked 0.50 packs per day. She has never used smokeless tobacco. She reports that she does not drink alcohol or use illicit drugs.   Family History:  The patient's family history includes Hypertension in her  father.    ROS: All other systems are reviewed and negative. Unless otherwise mentioned in H&P    PHYSICAL EXAM: VS:  BP 122/72 mmHg  Pulse 80  Ht 5\' 1"  (1.549 m)  Wt 187 lb (84.823 kg)  BMI 35.35 kg/m2  SpO2 92% , BMI Body mass index is 35.35 kg/(m^2). GEN: Well nourished, well developed, in no acute distress HEENT: normal Neck: no JVD, carotid bruits, or masses Cardiac: IRRR; no murmurs, rubs, or gallops,no edema  Respiratory:  clear to auscultation bilaterally, normal work of breathing GI: soft, nontender, nondistended, + BS MS: no deformity or atrophy Skin: warm and dry, no rash Neuro:  Strength and sensation are intact Psych: euthymic mood, full affect   EKG:  The ekg ordered today demonstrates normal sinus rhythm with frequent PACs, heart of 74 beats per minute.   Recent Labs: 05/31/2014: TSH 1.640 12/04/2014: ALT 16 03/06/2015: B Natriuretic Peptide 183.0*; Hemoglobin 12.8; Platelets 263 04/23/2015: BUN 16; Creat 1.23*; Potassium 4.3; Sodium 136    Lipid Panel No results found for: CHOL, TRIG, HDL, CHOLHDL, VLDL, LDLCALC, LDLDIRECT    Wt Readings from Last 3 Encounters:  04/30/15 187 lb (84.823 kg)  04/09/15 199 lb (90.266 kg)  03/06/15 195 lb (88.451 kg)      ASSESSMENT AND PLAN:  1. Paroxysmal Atrial fibrillation:she states she has been having periodic issues with feeling her heart beat, irregular, and extra thumping, with associated weakness and fatigue.  On one occasion when she felt her heart irregular.  She took an extra dose of amiodarone. EKG revealed normal sinus rhythm with PACs, which is what she may be experiencing.  However, because she is feeling more tired and is very aware of her heart rhythm, and irregularities, I will place a two-week.  Cardiac monitor on for clearer assessment of her cardiac rhythm.  She is to followup with Dr. Domenic Polite on a previously scheduled appointment on March 14. She will continue Xarelto as directed.  2. Diastolic heart  failure: she does not appear fluid overloaded today, there is no evidence of edema, lungs are clear.  Will not change any medications at this time.   Current medicines are reviewed at length with the patient today.    Labs/ tests ordered today include:  Orders Placed This Encounter  Procedures  . Cardiac event monitor  . EKG 12-Lead     Disposition:   FU with Dr. Domenic Polite on previously scheduled appointment in a few weeks Signed, Jory Sims, NP  04/30/2015 1:49 PM    [facility] [address], [place], [place] [address] Phone: [phone]; Fax: [phone]

## 2015-05-20 DIAGNOSIS — Z79899 Other long term (current) drug therapy: Secondary | ICD-10-CM | POA: Diagnosis not present

## 2015-05-21 ENCOUNTER — Ambulatory Visit (INDEPENDENT_AMBULATORY_CARE_PROVIDER_SITE_OTHER): Payer: Medicare PPO | Admitting: Cardiology

## 2015-05-21 ENCOUNTER — Encounter: Payer: Self-pay | Admitting: Cardiology

## 2015-05-21 VITALS — BP 132/76 | HR 76 | Ht 61.0 in | Wt 189.0 lb

## 2015-05-21 DIAGNOSIS — E039 Hypothyroidism, unspecified: Secondary | ICD-10-CM | POA: Diagnosis not present

## 2015-05-21 DIAGNOSIS — I10 Essential (primary) hypertension: Secondary | ICD-10-CM

## 2015-05-21 DIAGNOSIS — I480 Paroxysmal atrial fibrillation: Secondary | ICD-10-CM | POA: Diagnosis not present

## 2015-05-21 DIAGNOSIS — Z79899 Other long term (current) drug therapy: Secondary | ICD-10-CM

## 2015-05-21 DIAGNOSIS — R600 Localized edema: Secondary | ICD-10-CM

## 2015-05-21 LAB — HEPATIC FUNCTION PANEL
ALBUMIN: 4.1 g/dL (ref 3.6–5.1)
ALK PHOS: 97 U/L (ref 33–130)
ALT: 12 U/L (ref 6–29)
AST: 20 U/L (ref 10–35)
BILIRUBIN INDIRECT: 0.3 mg/dL (ref 0.2–1.2)
Bilirubin, Direct: 0.1 mg/dL (ref <=0.2)
TOTAL PROTEIN: 7 g/dL (ref 6.1–8.1)
Total Bilirubin: 0.4 mg/dL (ref 0.2–1.2)

## 2015-05-21 LAB — BASIC METABOLIC PANEL
BUN: 22 mg/dL (ref 7–25)
CALCIUM: 9.6 mg/dL (ref 8.6–10.4)
CHLORIDE: 97 mmol/L — AB (ref 98–110)
CO2: 32 mmol/L — AB (ref 20–31)
CREATININE: 1.11 mg/dL — AB (ref 0.50–0.99)
Glucose, Bld: 89 mg/dL (ref 65–99)
Potassium: 4.4 mmol/L (ref 3.5–5.3)
Sodium: 138 mmol/L (ref 135–146)

## 2015-05-21 NOTE — Patient Instructions (Signed)
Your physician wants you to follow-up in: 6 months with Dr McDowell You will receive a reminder letter in the mail two months in advance. If you don't receive a letter, please call our office to schedule the follow-up appointment.     Your physician recommends that you continue on your current medications as directed. Please refer to the Current Medication list given to you today.    If you need a refill on your cardiac medications before your next appointment, please call your pharmacy.     Thank you for choosing [facility] !        

## 2015-05-21 NOTE — Progress Notes (Signed)
Cardiology Office Note  Date: 05/21/2015   ID: BRADLEIGH HEDIN, DOB 17-Sep-1945, MRN [id number]  PCP: Alonza Bogus, MD  Primary Cardiologist: Rozann Lesches, MD   Chief Complaint  Patient presents with  . Atrial Fibrillation    History of Present Illness: Diane Cohen is a 70 y.o. female last seen by Ms. Lawrence NP in February. She has had trouble with leg edema, was switched to [facility] for diuretic per Ms. Lawrence NP and also prescribed compression hose. A cardiac monitor was recommended for symptoms of palpitations as well. She presents today stating that she decided not to pursue the cardiac monitor. She reports that her palpitations have improved. She has had no dizziness or syncope.  She continues to have leg edema although feels like it is somewhat better with the Demadex and use of compression stockings. We talked about elevating her legs when able as well.  I reviewed her most recent lab work. She is due for follow-up TSH and LFTs on amiodarone.   Past Medical History  Diagnosis Date  . Atrial fibrillation ([place])   . Hypertension   . Hypothyroidism   . Pneumonia   . TIA (transient ischemic attack)   . Chronic diastolic heart failure ([place])   . High cholesterol     Current Outpatient Prescriptions  Medication Sig Dispense Refill  . acetaminophen (TYLENOL) 500 MG tablet Take 1,000 mg by mouth every 6 (six) hours as needed.    Marland Kitchen albuterol (PROVENTIL HFA;VENTOLIN HFA) 108 (90 BASE) MCG/ACT inhaler Inhale 1-2 puffs into the lungs every 6 (six) hours as needed for wheezing or shortness of breath.    Marland Kitchen amiodarone (PACERONE) 200 MG tablet Take 0.5 tablets (100 mg total) by mouth daily. 45 tablet 3  . amLODipine (NORVASC) 5 MG tablet Take 5 mg by mouth daily.    . Calcium Carbonate-Vit D-Min (CALTRATE PLUS PO) Take by mouth 2 (two) times daily.    . Cyanocobalamin (B-12 COMPLIANCE INJECTION IJ) Inject as directed.    . DULoxetine (CYMBALTA) 60 MG capsule TK 1 C PO QD  6    . estradiol (ESTRACE) 1 MG tablet Take 1 tablet (1 mg total) by mouth at bedtime. 90 tablet 3  . levothyroxine (SYNTHROID, LEVOTHROID) 100 MCG tablet Take 100 mcg by mouth daily before breakfast.    . lisinopril (PRINIVIL,ZESTRIL) 20 MG tablet Take 1 tablet (20 mg total) by mouth daily.    . magnesium oxide (MAG-OX) 400 MG tablet Take 400 mg by mouth daily.    . metoprolol succinate (TOPROL-XL) 100 MG 24 hr tablet Take 100 mg by mouth daily. Take with or immediately following a meal.    . Multiple Vitamin (MULTIVITAMIN) tablet Take 1 tablet by mouth daily.    . Potassium Chloride ER 20 MEQ TBCR Take 40 mEq by mouth daily. 30 tablet 3  . rizatriptan (MAXALT-MLT) 10 MG disintegrating tablet Take 10 mg by mouth as needed for migraine. Reported on 04/30/2015    . torsemide (DEMADEX) 20 MG tablet Take 2 tablets (40 mg total) by mouth every morning. 60 tablet 3  . XARELTO 20 MG TABS tablet TAKE 1 TABLET BY MOUTH DAILY WITH DINNER 30 tablet 11   No current facility-administered medications for this visit.   Allergies:  Levaquin and Sulfa antibiotics   Social History: The patient  reports that she quit smoking about 42 years ago. Her smoking use included Cigarettes. She smoked 0.50 packs per day. She has never used smokeless tobacco. She  reports that she does not drink alcohol or use illicit drugs.   ROS:  Please see the history of present illness. Otherwise, complete review of systems is positive for fatigue, chronic leg edema.  All other systems are reviewed and negative.   Physical Exam: VS:  BP 132/76 mmHg  Pulse 76  Ht 5\' 1"  (1.549 m)  Wt 189 lb (85.73 kg)  BMI 35.73 kg/m2  SpO2 93%, BMI Body mass index is 35.73 kg/(m^2).  Wt Readings from Last 3 Encounters:  05/21/15 189 lb (85.73 kg)  04/30/15 187 lb (84.823 kg)  04/09/15 199 lb (90.266 kg)    Overweight woman, appears comfortable.  HEENT: Conjunctiva and lids normal, oropharynx clear.  Neck: Supple, no elevated JVP or carotid  bruits, no thyromegaly.  Lungs: Course but clear to auscultation, nonlabored breathing at rest.  Cardiac: Regular rate and rhythmWith occasional ectopy, 2/6 systolic murmur at the base, no pericardial rub.  Abdomen: Soft, nontender, bowel sounds present, no guarding or rebound.  Extremities: 1-2+ edema below the knees, more prominent involving left ankle, distal pulses 2+.  Skin: Warm and dry.   ECG: I personally reviewed her tracing from 04/30/2015 which showed sinus rhythm with PACs.  Recent Labwork: 05/31/2014: TSH 1.640 12/04/2014: ALT 16; AST 18 03/06/2015: B Natriuretic Peptide 183.0*; Hemoglobin 12.8; Platelets 263 05/20/2015: BUN 22; Creat 1.11*; Potassium 4.4; Sodium 138   Other Studies Reviewed Today:  Echocardiogram 04/11/2015: Study Conclusions  - Left ventricle: The cavity size was normal. Wall thickness was  increased in a pattern of mild LVH. Systolic function was normal.  The estimated ejection fraction was in the range of 60% to 65%.  Wall motion was normal; there were no regional wall motion  abnormalities. Features are consistent with a pseudonormal left  ventricular filling pattern, with concomitant abnormal relaxation  and increased filling pressure (grade 2 diastolic dysfunction). - Mitral valve: There was mild regurgitation. - Right atrium: The atrium was mildly dilated. There was the  appearance of a Chiari network. - Tricuspid valve: There was mild regurgitation. - Pulmonary arteries: PA peak pressure: 25 mm Hg (S). - Pericardium, extracardiac: There was no pericardial effusion.  Impressions:  - Mild LVH with LVEF 60-65% and grade 2 diastolic dysfunction with  increased filling pressure. Mild mitral regurgitation. Mild  tricuspid regurgitation with normal PASP 25 mmHg. Chiari network  noted in the right atrium.  Assessment and Plan:  1. Paroxysmal atrial fibrillation. Symptomatically controlled on low-dose amiodarone, Toprol-XL, and  Xarelto. Recent creatinine and hemoglobin reviewed above. She is due for follow-up TSH and LFTs which will be arranged. She does not report any progressing palpitations and prefers to hold off on cardiac monitoring.  2. Essential hypertension, blood pressure control is adequate today.  3. Known history of hypothyroidism, on Synthroid. Follow-up TSH being obtained as outlined above.  4. Chronic leg edema, somewhat better with change to Demadex and compression stockings.  Current medicines were reviewed with the patient today.   Orders Placed This Encounter  Procedures  . TSH  . Hepatic function panel    Disposition: FU with me in 6 months.   Signed, Satira Sark, MD, [facility] 05/21/2015 2:16 PM    [facility] [place], [place], [place] [address] Phone: [phone]; Fax: [phone]

## 2015-05-22 LAB — TSH: TSH: 1.85 mIU/L

## 2015-05-22 NOTE — Telephone Encounter (Signed)
LMTCB, 3/15- LM

## 2015-05-22 NOTE — Telephone Encounter (Signed)
-----   Message from Satira Sark, MD sent at 05/22/2015  7:31 AM EDT ----- Reviewed. TSH and LFTs are normal. Continue same regimen.

## 2015-08-09 ENCOUNTER — Other Ambulatory Visit: Payer: Self-pay | Admitting: Cardiology

## 2015-08-09 DIAGNOSIS — I5030 Unspecified diastolic (congestive) heart failure: Secondary | ICD-10-CM | POA: Diagnosis not present

## 2015-08-09 DIAGNOSIS — J449 Chronic obstructive pulmonary disease, unspecified: Secondary | ICD-10-CM | POA: Diagnosis not present

## 2015-08-09 DIAGNOSIS — I10 Essential (primary) hypertension: Secondary | ICD-10-CM | POA: Diagnosis not present

## 2015-08-09 DIAGNOSIS — I482 Chronic atrial fibrillation: Secondary | ICD-10-CM | POA: Diagnosis not present

## 2015-08-13 ENCOUNTER — Telehealth: Payer: Self-pay | Admitting: Cardiology

## 2015-08-13 MED ORDER — POTASSIUM CHLORIDE CRYS ER 20 MEQ PO TBCR: 40.0000 meq | EXTENDED_RELEASE_TABLET | Freq: Every day | ORAL | Status: DC

## 2015-08-13 NOTE — Telephone Encounter (Signed)
Patient has questions regarding Potassium dosage / tg  °

## 2015-08-13 NOTE — Telephone Encounter (Signed)
Requested 90 day refill,done

## 2015-09-16 ENCOUNTER — Other Ambulatory Visit: Payer: Self-pay | Admitting: Cardiology

## 2015-10-01 ENCOUNTER — Encounter: Payer: Self-pay | Admitting: Cardiology

## 2015-10-07 DIAGNOSIS — M25572 Pain in left ankle and joints of left foot: Secondary | ICD-10-CM | POA: Diagnosis not present

## 2015-10-22 ENCOUNTER — Encounter: Payer: Self-pay | Admitting: Cardiology

## 2015-11-12 ENCOUNTER — Other Ambulatory Visit (HOSPITAL_COMMUNITY): Payer: Self-pay | Admitting: Pulmonary Disease

## 2015-11-12 ENCOUNTER — Ambulatory Visit (HOSPITAL_COMMUNITY)
Admission: RE | Admit: 2015-11-12 | Discharge: 2015-11-12 | Disposition: A | Discharge status: Home / Self Care | Payer: Medicare PPO | Source: Ambulatory Visit | Attending: Pulmonary Disease | Admitting: Pulmonary Disease

## 2015-11-12 DIAGNOSIS — M25512 Pain in left shoulder: Secondary | ICD-10-CM | POA: Diagnosis not present

## 2015-11-12 DIAGNOSIS — Z0000 Encounter for general adult medical examination without abnormal findings: Secondary | ICD-10-CM | POA: Diagnosis not present

## 2015-11-12 DIAGNOSIS — R52 Pain, unspecified: Secondary | ICD-10-CM

## 2015-11-12 DIAGNOSIS — M19012 Primary osteoarthritis, left shoulder: Secondary | ICD-10-CM | POA: Diagnosis not present

## 2015-11-12 DIAGNOSIS — Z23 Encounter for immunization: Secondary | ICD-10-CM | POA: Diagnosis not present

## 2015-11-12 DIAGNOSIS — Z780 Asymptomatic menopausal state: Secondary | ICD-10-CM

## 2015-11-14 ENCOUNTER — Other Ambulatory Visit: Payer: Self-pay | Admitting: Adult Health

## 2015-11-15 ENCOUNTER — Encounter: Payer: Self-pay | Admitting: Internal Medicine

## 2015-11-15 ENCOUNTER — Other Ambulatory Visit (HOSPITAL_COMMUNITY): Payer: Self-pay

## 2015-11-19 ENCOUNTER — Ambulatory Visit (HOSPITAL_COMMUNITY)
Admission: RE | Admit: 2015-11-19 | Discharge: 2015-11-19 | Disposition: A | Discharge status: Home / Self Care | Payer: Medicare PPO | Source: Ambulatory Visit | Attending: Pulmonary Disease | Admitting: Pulmonary Disease

## 2015-11-19 DIAGNOSIS — M85862 Other specified disorders of bone density and structure, left lower leg: Secondary | ICD-10-CM | POA: Diagnosis not present

## 2015-11-19 DIAGNOSIS — Z780 Asymptomatic menopausal state: Secondary | ICD-10-CM | POA: Insufficient documentation

## 2015-11-19 DIAGNOSIS — M85852 Other specified disorders of bone density and structure, left thigh: Secondary | ICD-10-CM | POA: Diagnosis not present

## 2015-11-20 ENCOUNTER — Ambulatory Visit: Payer: Self-pay | Admitting: Cardiology

## 2015-11-27 ENCOUNTER — Ambulatory Visit: Payer: Self-pay | Admitting: Cardiology

## 2015-12-10 ENCOUNTER — Ambulatory Visit: Payer: Self-pay | Admitting: Gastroenterology

## 2015-12-16 ENCOUNTER — Encounter: Payer: Self-pay | Admitting: *Deleted

## 2015-12-17 NOTE — Progress Notes (Signed)
Cardiology Office Note  Date: 12/18/2015   ID: Diane Cohen, DOB [birth year], MRN [id number]  PCP: Alonza Bogus, MD  Primary Cardiologist: Rozann Lesches, MD   Chief Complaint  Patient presents with  . Atrial Fibrillation    History of Present Illness: Diane Cohen is a 70 y.o. female last seen in March. She presents for a follow-up visit, states that she has felt more palpitations since August. She is noted be back in atrial fibrillation today by ECG. Absolute duration is not entirely certain.  She has known PAF, has actually done quite well on amiodarone in terms of rhythm suppression. She reports compliance with Xarelto for stroke prophylaxis. Also taking Toprol-XL 100 mg daily.  Today we discussed a temporary increase in oral amiodarone for reloading, and if she does not convert within the next few weeks, we can always consider an elective cardioversion.  ECG today shows atrial fibrillation 126 bpm with low voltage and nonspecific T-wave changes.  Past Medical History:  Diagnosis Date  . Atrial fibrillation ([place])   . Chronic diastolic heart failure ([place])   . High cholesterol   . Hypertension   . Hypothyroidism   . Pneumonia   . TIA (transient ischemic attack)     Past Surgical History:  Procedure Laterality Date  . ABDOMINAL HYSTERECTOMY    . BREAST SURGERY    . CHOLECYSTECTOMY    . coloscopy      Current Outpatient Prescriptions  Medication Sig Dispense Refill  . acetaminophen (TYLENOL) 500 MG tablet Take 1,000 mg by mouth every 6 (six) hours as needed.    Marland Kitchen albuterol (PROVENTIL HFA;VENTOLIN HFA) 108 (90 BASE) MCG/ACT inhaler Inhale 1-2 puffs into the lungs every 6 (six) hours as needed for wheezing or shortness of breath.    Marland Kitchen amiodarone (PACERONE) 200 MG tablet TAKE 1/2 TABLET BY MOUTH EVERY DAY 45 tablet 6  . amLODipine (NORVASC) 5 MG tablet Take 5 mg by mouth daily.    . Calcium Carbonate-Vit D-Min (CALTRATE PLUS PO) Take by mouth 2 (two) times  daily.    . Cyanocobalamin (B-12 COMPLIANCE INJECTION IJ) Inject as directed.    . DULoxetine (CYMBALTA) 60 MG capsule TK 1 C PO QD  6  . estradiol (ESTRACE) 1 MG tablet Take 1 tablet (1 mg total) by mouth at bedtime. 90 tablet 3  . levothyroxine (SYNTHROID, LEVOTHROID) 100 MCG tablet Take 100 mcg by mouth daily before breakfast.    . lisinopril (PRINIVIL,ZESTRIL) 20 MG tablet Take 1 tablet (20 mg total) by mouth daily.    . magnesium oxide (MAG-OX) 400 MG tablet Take 400 mg by mouth daily.    . metoprolol succinate (TOPROL-XL) 100 MG 24 hr tablet Take 100 mg by mouth daily. Take with or immediately following a meal.    . Multiple Vitamin (MULTIVITAMIN) tablet Take 1 tablet by mouth daily.    . potassium chloride SA (K-DUR,KLOR-CON) 20 MEQ tablet Take 2 tablets (40 mEq total) by mouth daily. 180 tablet 3  . rizatriptan (MAXALT-MLT) 10 MG disintegrating tablet Take 10 mg by mouth as needed for migraine. Reported on 04/30/2015    . torsemide (DEMADEX) 20 MG tablet TAKE 2 TABLETS(40 MG) BY MOUTH EVERY MORNING 60 tablet 3  . XARELTO 20 MG TABS tablet TAKE 1 TABLET BY MOUTH DAILY WITH DINNER 30 tablet 11   No current facility-administered medications for this visit.    Allergies:  Levaquin [levofloxacin] and Sulfa antibiotics   Social History: The patient  reports  that she quit smoking about 42 years ago. Her smoking use included Cigarettes. She smoked 0.50 packs per day. She has never used smokeless tobacco. She reports that she does not drink alcohol or use drugs.   ROS:  Please see the history of present illness. Otherwise, complete review of systems is positive for none.  All other systems are reviewed and negative.   Physical Exam: VS:  BP 118/80   Pulse 89   Ht 5\' 1"  (1.549 m)   Wt 187 lb (84.8 kg)   SpO2 98%   BMI 35.33 kg/m , BMI Body mass index is 35.33 kg/m.  Wt Readings from Last 3 Encounters:  12/18/15 187 lb (84.8 kg)  05/21/15 189 lb (85.7 kg)  04/30/15 187 lb (84.8 kg)      Overweight woman, appears comfortable.  HEENT: Conjunctiva and lids normal, oropharynx clear.  Neck: Supple, no elevated JVP or carotid bruits, no thyromegaly.  Lungs: Course but clear to auscultation, nonlabored breathing at rest.  Cardiac: Irregularly irregular, 2/6 systolic murmur at the base, no pericardial rub. Abdomen: Soft, nontender, bowel sounds present, no guarding or rebound.  Extremities: 1-2+ edema below the knees, more prominent involving left ankle, distal pulses 2+.  Skin: Warm and dry.  Neuropsychiatric: Alert and oriented 3, affect appropriate.  ECG: I personally reviewed the tracing from 04/30/2015 which showed sinus rhythm with PACs.  Recent Labwork: 03/06/2015: B Natriuretic Peptide 183.0; Hemoglobin 12.8; Platelets 263 05/20/2015: BUN 22; Creat 1.11; Potassium 4.4; Sodium 138 05/21/2015: ALT 12; AST 20; TSH 1.85   Other Studies Reviewed Today:  Echocardiogram 04/11/2015: Study Conclusions  - Left ventricle: The cavity size was normal. Wall thickness was  increased in a pattern of mild LVH. Systolic function was normal.  The estimated ejection fraction was in the range of 60% to 65%.  Wall motion was normal; there were no regional wall motion  abnormalities. Features are consistent with a pseudonormal left  ventricular filling pattern, with concomitant abnormal relaxation  and increased filling pressure (grade 2 diastolic dysfunction). - Mitral valve: There was mild regurgitation. - Right atrium: The atrium was mildly dilated. There was the  appearance of a Chiari network. - Tricuspid valve: There was mild regurgitation. - Pulmonary arteries: PA peak pressure: 25 mm Hg (S). - Pericardium, extracardiac: There was no pericardial effusion.  Impressions:  - Mild LVH with LVEF 60-65% and grade 2 diastolic dysfunction with  increased filling pressure. Mild mitral regurgitation. Mild  tricuspid regurgitation with normal PASP 25 mmHg. Chiari  network  noted in the right atrium.  Assessment and Plan:  1. Recurrent atrial fibrillation, persistent at this time. Plan is to continue current regimen except increase amiodarone to 200 mg twice daily for a week and then down to 200 mg daily. I will see her back in a few weeks and determine if we need to pursue an elective cardioversion or not.  2. Essential hypertension, blood pressure control is good today.  3. Hyperlipidemia by history, diet managed.  4. History of diastolic heart failure, has been stable on Demadex. Weight is down 2 pounds compared to the last visit.  Current medicines were reviewed with the patient today.   Orders Placed This Encounter  Procedures  . EKG 12-Lead    Disposition: Follow-up with me in 2 weeks.  Signed, Satira Sark, MD, [facility] 12/18/2015 3:54 PM    [facility] at [address], [place], [place] [address] Phone: [phone]; Fax: [phone]

## 2015-12-18 ENCOUNTER — Encounter: Payer: Self-pay | Admitting: Cardiology

## 2015-12-18 ENCOUNTER — Ambulatory Visit (INDEPENDENT_AMBULATORY_CARE_PROVIDER_SITE_OTHER): Payer: Medicare PPO | Admitting: Cardiology

## 2015-12-18 VITALS — BP 118/80 | HR 89 | Ht 61.0 in | Wt 187.0 lb

## 2015-12-18 DIAGNOSIS — E782 Mixed hyperlipidemia: Secondary | ICD-10-CM

## 2015-12-18 DIAGNOSIS — I10 Essential (primary) hypertension: Secondary | ICD-10-CM | POA: Diagnosis not present

## 2015-12-18 DIAGNOSIS — I481 Persistent atrial fibrillation: Secondary | ICD-10-CM | POA: Diagnosis not present

## 2015-12-18 DIAGNOSIS — I5032 Chronic diastolic (congestive) heart failure: Secondary | ICD-10-CM

## 2015-12-18 DIAGNOSIS — I4819 Other persistent atrial fibrillation: Secondary | ICD-10-CM

## 2015-12-18 MED ORDER — AMIODARONE HCL 200 MG PO TABS: 200.0000 mg | ORAL_TABLET | Freq: Two times a day (BID) | ORAL | 0 refills | Status: DC

## 2015-12-18 NOTE — Patient Instructions (Signed)
Medication Instructions:   Your physician has recommended you make the following change in your medication:   Increase amiodarone 200 mg to twice daily for one week; then reduce to 200 mg daily.  Continue all other medications the same.  Labwork: NONE  Testing/Procedures: NONE  Follow-Up:  Your physician recommends that you schedule a follow-up appointment in: 2 weeks.  Any Other Special Instructions Will Be Listed Below (If Applicable).  If you need a refill on your cardiac medications before your next appointment, please call your pharmacy.

## 2015-12-30 ENCOUNTER — Telehealth: Payer: Self-pay | Admitting: Cardiology

## 2015-12-30 NOTE — Telephone Encounter (Signed)
Patient had to change her f/u appointment to Diane Cohen due to a conflicting appointment for her husband. Patient c/o that her symptoms with her heart have been unchanged with recent change in her amiodarone dose. Patient said her heart feels the same as it did before her last appointment. Patient stated, "my heart feels like its beating fast and sometimes it feels like its skipping." No c/o dizziness, chest pain or sob. BP 127/95 & HR 99. Patient is currently taking amiodarone 200 mg daily. Patient is asking if she should stay on this dose or if she should take the higher dosage longer. Please advise.

## 2015-12-30 NOTE — Telephone Encounter (Signed)
Patient had to reschedule her appt and wants to know if she keeps taking her medication the same way even though her heart keeps acting up

## 2015-12-31 NOTE — Telephone Encounter (Signed)
Pt voiced understand and will increase amiodarone 200 mg bid until Nov OV with Jory Sims, NP

## 2015-12-31 NOTE — Telephone Encounter (Signed)
It looks like we are probably going to have to pursue a cardioversion if she stays in atrial fibrillation. We can try and increase amiodarone temporarily back to 200 mg twice daily between now and when I see her back in the office.

## 2016-01-02 ENCOUNTER — Ambulatory Visit: Payer: Self-pay | Admitting: Adult Health

## 2016-01-09 ENCOUNTER — Ambulatory Visit: Payer: Self-pay | Admitting: Physician Assistant

## 2016-01-22 ENCOUNTER — Encounter: Payer: Self-pay | Admitting: Physician Assistant

## 2016-01-22 ENCOUNTER — Encounter: Payer: Self-pay | Admitting: *Deleted

## 2016-01-22 ENCOUNTER — Encounter (HOSPITAL_COMMUNITY)
Admission: RE | Admit: 2016-01-22 | Discharge: 2016-01-22 | Disposition: A | Discharge status: Home / Self Care | Payer: Medicare PPO | Source: Ambulatory Visit | Attending: Cardiology | Admitting: Cardiology

## 2016-01-22 ENCOUNTER — Encounter (HOSPITAL_COMMUNITY): Payer: Self-pay

## 2016-01-22 ENCOUNTER — Ambulatory Visit (INDEPENDENT_AMBULATORY_CARE_PROVIDER_SITE_OTHER): Payer: Medicare PPO | Admitting: Physician Assistant

## 2016-01-22 VITALS — BP 122/84 | HR 98 | Ht 61.0 in | Wt 189.0 lb

## 2016-01-22 DIAGNOSIS — I5033 Acute on chronic diastolic (congestive) heart failure: Secondary | ICD-10-CM | POA: Diagnosis not present

## 2016-01-22 DIAGNOSIS — E039 Hypothyroidism, unspecified: Secondary | ICD-10-CM | POA: Diagnosis not present

## 2016-01-22 DIAGNOSIS — F419 Anxiety disorder, unspecified: Secondary | ICD-10-CM | POA: Diagnosis not present

## 2016-01-22 DIAGNOSIS — Z8673 Personal history of transient ischemic attack (TIA), and cerebral infarction without residual deficits: Secondary | ICD-10-CM | POA: Diagnosis not present

## 2016-01-22 DIAGNOSIS — F329 Major depressive disorder, single episode, unspecified: Secondary | ICD-10-CM | POA: Diagnosis not present

## 2016-01-22 DIAGNOSIS — Z7901 Long term (current) use of anticoagulants: Secondary | ICD-10-CM | POA: Diagnosis not present

## 2016-01-22 DIAGNOSIS — I4891 Unspecified atrial fibrillation: Secondary | ICD-10-CM | POA: Diagnosis not present

## 2016-01-22 DIAGNOSIS — I110 Hypertensive heart disease with heart failure: Secondary | ICD-10-CM | POA: Diagnosis not present

## 2016-01-22 DIAGNOSIS — I10 Essential (primary) hypertension: Secondary | ICD-10-CM

## 2016-01-22 DIAGNOSIS — I481 Persistent atrial fibrillation: Secondary | ICD-10-CM | POA: Diagnosis not present

## 2016-01-22 DIAGNOSIS — Z79899 Other long term (current) drug therapy: Secondary | ICD-10-CM | POA: Diagnosis not present

## 2016-01-22 DIAGNOSIS — Z87891 Personal history of nicotine dependence: Secondary | ICD-10-CM | POA: Diagnosis not present

## 2016-01-22 DIAGNOSIS — K219 Gastro-esophageal reflux disease without esophagitis: Secondary | ICD-10-CM | POA: Diagnosis not present

## 2016-01-22 HISTORY — DX: Major depressive disorder, single episode, unspecified: F32.9

## 2016-01-22 HISTORY — DX: Depression, unspecified: F32.A

## 2016-01-22 HISTORY — DX: Anxiety disorder, unspecified: F41.9

## 2016-01-22 LAB — BASIC METABOLIC PANEL
ANION GAP: 6 (ref 5–15)
BUN: 19 mg/dL (ref 6–20)
CALCIUM: 9.6 mg/dL (ref 8.9–10.3)
CO2: 26 mmol/L (ref 22–32)
CREATININE: 1.11 mg/dL — AB (ref 0.44–1.00)
Chloride: 101 mmol/L (ref 101–111)
GFR, EST AFRICAN AMERICAN: 57 mL/min — AB (ref >60)
GFR, EST NON AFRICAN AMERICAN: 49 mL/min — AB (ref >60)
Glucose, Bld: 82 mg/dL (ref 65–99)
Potassium: 3.6 mmol/L (ref 3.5–5.1)
SODIUM: 133 mmol/L — AB (ref 135–145)

## 2016-01-22 LAB — CBC WITH DIFFERENTIAL/PLATELET
BASOS ABS: 0 10*3/uL (ref 0.0–0.1)
BASOS PCT: 0 %
EOS ABS: 0.1 10*3/uL (ref 0.0–0.7)
EOS PCT: 2 %
HEMATOCRIT: 35.5 % — AB (ref 36.0–46.0)
Hemoglobin: 11.2 g/dL — ABNORMAL LOW (ref 12.0–15.0)
Lymphocytes Relative: 26 %
Lymphs Abs: 1.5 10*3/uL (ref 0.7–4.0)
MCH: 29.5 pg (ref 26.0–34.0)
MCHC: 31.5 g/dL (ref 30.0–36.0)
MCV: 93.4 fL (ref 78.0–100.0)
MONO ABS: 0.5 10*3/uL (ref 0.1–1.0)
MONOS PCT: 9 %
NEUTROS ABS: 3.7 10*3/uL (ref 1.7–7.7)
Neutrophils Relative %: 63 %
PLATELETS: 288 10*3/uL (ref 150–400)
RBC: 3.8 MIL/uL — ABNORMAL LOW (ref 3.87–5.11)
RDW: 15.6 % — AB (ref 11.5–15.5)
WBC: 5.9 10*3/uL (ref 4.0–10.5)

## 2016-01-22 LAB — PROTIME-INR
INR: 1.31
Prothrombin Time: 16.4 seconds — ABNORMAL HIGH (ref 11.4–15.2)

## 2016-01-22 MED ORDER — AMIODARONE HCL 200 MG PO TABS
200.0000 mg | ORAL_TABLET | Freq: Two times a day (BID) | ORAL | 3 refills | Status: DC
Start: 2016-01-22 — End: 2016-01-22

## 2016-01-22 MED ORDER — AMIODARONE HCL 200 MG PO TABS: 200.0000 mg | ORAL_TABLET | Freq: Two times a day (BID) | ORAL | 0 refills | Status: DC

## 2016-01-22 NOTE — Patient Instructions (Signed)
Diane Cohen  01/22/2016     @PREFPERIOPPHARMACY @   Your procedure is scheduled on  01/23/2016   Report to [facility] at  830  A.M.  Call this number if you have problems the morning of surgery:  [phone]   Remember:  Do not eat food or drink liquids after midnight.  Take these medicines the morning of surgery with A SIP OF WATER  Maxalt, lisinopril, levothyroxine, cymbalta, amlodipine, acerone. Take your inhaler before you come. Make sure to not miss any doses of your xarelto.    Do not wear jewelry, make-up or nail polish.  Do not wear lotions, powders, or perfumes, or deoderant.  Do not shave 48 hours prior to surgery.  Men may shave face and neck.  Do not bring valuables to the hospital.  [facility] is not responsible for any belongings or valuables.  Contacts, dentures or bridgework may not be worn into surgery.  Leave your suitcase in the car.  After surgery it may be brought to your room.  For patients admitted to the hospital, discharge time will be determined by your treatment team.  Patients discharged the day of surgery will not be allowed to drive home.   Name and phone number of your driver:   family Special instructions:  none  Please read over the following fact sheets that you were given. Anesthesia Post-op Instructions and Care and Recovery After Surgery       Electrical Cardioversion Electrical cardioversion is the delivery of a jolt of electricity to restore a normal rhythm to the heart. A rhythm that is too fast or is not regular keeps the heart from pumping well. In this procedure, sticky patches or metal paddles are placed on the chest to deliver electricity to the heart from a device. This procedure may be done in an emergency if:  There is low or no blood pressure as a result of the heart rhythm.  Normal rhythm must be restored as fast as possible to protect the brain and heart from further damage.  It may save a  life. This procedure may also be done for irregular or fast heart rhythms that are not immediately life-threatening. Tell a health care provider about:  Any allergies you have.  All medicines you are taking, including vitamins, herbs, eye drops, creams, and over-the-counter medicines.  Any problems you or family members have had with anesthetic medicines.  Any blood disorders you have.  Any surgeries you have had.  Any medical conditions you have.  Whether you are pregnant or may be pregnant. What are the risks? Generally, this is a safe procedure. However, problems may occur, including:  Allergic reactions to medicines.  A blood clot that breaks free and travels to other parts of your body.  The possible return of an abnormal heart rhythm within hours or days after the procedure.  Your heart stopping (cardiac arrest). This is rare. What happens before the procedure? Medicines  Your health care provider may have you start taking:  Blood-thinning medicines (anticoagulants) so your blood does not clot as easily.  Medicines may be given to help stabilize your heart rate and rhythm.  Ask your health care provider about changing or stopping your regular medicines. This is especially important if you are taking diabetes medicines or blood thinners. General instructions  Plan to have someone take you home from the hospital or clinic.  If you will be going home right  after the procedure, plan to have someone with you for 24 hours.  Follow instructions from your health care provider about eating or drinking restrictions. What happens during the procedure?  To lower your risk of infection:  Your health care team will wash or sanitize their hands.  Your skin will be washed with soap.  An IV tube will be inserted into one of your veins.  You will be given a medicine to help you relax (sedative).  Sticky patches (electrodes) or metal paddles may be placed on your  chest.  An electrical shock will be delivered. The procedure may vary among health care providers and hospitals. What happens after the procedure?  Your blood pressure, heart rate, breathing rate, and blood oxygen level will be monitored until the medicines you were given have worn off.  Do not drive for 24 hours if you were given a sedative.  Your heart rhythm will be watched to make sure it does not change. This information is not intended to replace advice given to you by your health care provider. Make sure you discuss any questions you have with your health care provider. Document Released: 02/13/2002 Document Revised: 10/23/2015 Document Reviewed: 08/30/2015 Elsevier Interactive Patient Education  2017 [employer].  Electrical Cardioversion, Care After This sheet gives you information about how to care for yourself after your procedure. Your health care provider may also give you more specific instructions. If you have problems or questions, contact your health care provider. What can I expect after the procedure? After the procedure, it is common to have:  Some redness on the skin where the shocks were given. Follow these instructions at home:  Do not drive for 24 hours if you were given a medicine to help you relax (sedative).  Take over-the-counter and prescription medicines only as told by your health care provider.  Ask your health care provider how to check your pulse. Check it often.  Rest for 48 hours after the procedure or as told by your health care provider.  Avoid or limit your caffeine use as told by your health care provider. Contact a health care provider if:  You feel like your heart is beating too quickly or your pulse is not regular.  You have a serious muscle cramp that does not go away. Get help right away if:  You have discomfort in your chest.  You are dizzy or you feel faint.  You have trouble breathing or you are short of breath.  Your  speech is slurred.  You have trouble moving an arm or leg on one side of your body.  Your fingers or toes turn cold or blue. This information is not intended to replace advice given to you by your health care provider. Make sure you discuss any questions you have with your health care provider. Document Released: 12/14/2012 Document Revised: 09/27/2015 Document Reviewed: 08/30/2015 Elsevier Interactive Patient Education  2017 [place] Anesthesia is a term that refers to techniques, procedures, and medicines that help a person stay safe and comfortable during a medical procedure. Monitored anesthesia care, or sedation, is one type of anesthesia. Your anesthesia specialist may recommend sedation if you will be having a procedure that does not require you to be unconscious, such as:  Cataract surgery.  A dental procedure.  A biopsy.  A colonoscopy. During the procedure, you may receive a medicine to help you relax (sedative). There are three levels of sedation:  Mild sedation. At this level,  you may feel awake and relaxed. You will be able to follow directions.  Moderate sedation. At this level, you will be sleepy. You may not remember the procedure.  Deep sedation. At this level, you will be asleep. You will not remember the procedure. The more medicine you are given, the deeper your level of sedation will be. Depending on how you respond to the procedure, the anesthesia specialist may change your level of sedation or the type of anesthesia to fit your needs. An anesthesia specialist will monitor you closely during the procedure. Let your health care provider know about:  Any allergies you have.  All medicines you are taking, including vitamins, herbs, eye drops, creams, and over-the-counter medicines.  Any use of steroids (by mouth or as a cream).  Any problems you or family members have had with sedatives and anesthetic medicines.  Any blood  disorders you have.  Any surgeries you have had.  Any medical conditions you have, such as sleep apnea.  Whether you are pregnant or may be pregnant.  Any use of cigarettes, alcohol, or street drugs. What are the risks? Generally, this is a safe procedure. However, problems may occur, including:  Getting too much medicine (oversedation).  Nausea.  Allergic reaction to medicines.  Trouble breathing. If this happens, a breathing tube may be used to help with breathing. It will be removed when you are awake and breathing on your own.  Heart trouble.  Lung trouble. Before the procedure Staying hydrated  Follow instructions from your health care provider about hydration, which may include:  Up to 2 hours before the procedure - you may continue to drink clear liquids, such as water, clear fruit juice, black coffee, and plain tea. Eating and drinking restrictions  Follow instructions from your health care provider about eating and drinking, which may include:  8 hours before the procedure - stop eating heavy meals or foods such as meat, fried foods, or fatty foods.  6 hours before the procedure - stop eating light meals or foods, such as toast or cereal.  6 hours before the procedure - stop drinking milk or drinks that contain milk.  2 hours before the procedure - stop drinking clear liquids. Medicines  Ask your health care provider about:  Changing or stopping your regular medicines. This is especially important if you are taking diabetes medicines or blood thinners.  Taking medicines such as aspirin and ibuprofen. These medicines can thin your blood. Do not take these medicines before your procedure if your health care provider instructs you not to. Tests and exams  You will have a physical exam.  You may have blood tests done to show:  How well your kidneys and liver are working.  How well your blood can clot.  General instructions  Plan to have someone take you  home from the hospital or clinic.  If you will be going home right after the procedure, plan to have someone with you for 24 hours. What happens during the procedure?  Your blood pressure, heart rate, breathing, level of pain and overall condition will be monitored.  An IV tube will be inserted into one of your veins.  Your anesthesia specialist will give you medicines as needed to keep you comfortable during the procedure. This may mean changing the level of sedation.  The procedure will be performed. After the procedure  Your blood pressure, heart rate, breathing rate, and blood oxygen level will be monitored until the medicines you were given have  worn off.  Do not drive for 24 hours if you received a sedative.  You may:  Feel sleepy, clumsy, or nauseous.  Feel forgetful about what happened after the procedure.  Have a sore throat if you had a breathing tube during the procedure.  Vomit. This information is not intended to replace advice given to you by your health care provider. Make sure you discuss any questions you have with your health care provider. Document Released: 11/19/2004 Document Revised: 08/02/2015 Document Reviewed: 06/16/2015 Elsevier Interactive Patient Education  2017 [place] POST-ANESTHESIA  IMMEDIATELY FOLLOWING SURGERY:  Do not drive or operate machinery for the first twenty four hours after surgery.  Do not make any important decisions for twenty four hours after surgery or while taking narcotic pain medications or sedatives.  If you develop intractable nausea and vomiting or a severe headache please notify your doctor immediately.  FOLLOW-UP:  Please make an appointment with your surgeon as instructed. You do not need to follow up with anesthesia unless specifically instructed to do so.  WOUND CARE INSTRUCTIONS (if applicable):  Keep a dry clean dressing on the anesthesia/puncture wound site if there is drainage.  Once the  wound has quit draining you may leave it open to air.  Generally you should leave the bandage intact for twenty four hours unless there is drainage.  If the epidural site drains for more than 36-48 hours please call the anesthesia department.  QUESTIONS?:  Please feel free to call your physician or the hospital operator if you have any questions, and they will be happy to assist you.

## 2016-01-22 NOTE — Patient Instructions (Addendum)
Your physician has recommended that you have a Cardioversion (DCCV). Electrical Cardioversion uses a jolt of electricity to your heart either through paddles or wired patches attached to your chest. This is a controlled, usually prescheduled, procedure. Defibrillation is done under light anesthesia in the hospital, and you usually go home the day of the procedure. This is done to get your heart back into a normal rhythm. You are not awake for the procedure. Please see the instruction sheet given to you today.  Your physician recommends that you return for lab work in: Today   If you need a refill on your cardiac medications before your next appointment, please call your pharmacy.  Thank you for choosing [facility]!

## 2016-01-22 NOTE — Addendum Note (Signed)
Addended by: Imogene Burn on: 01/22/2016 02:42 PM   Modules accepted: Orders, SmartSet

## 2016-01-22 NOTE — Progress Notes (Addendum)
Cardiology Office Note    Date:  01/22/2016   ID:  Diane Cohen, DOB 1945/12/02, MRN [id number]  PCP:  Alonza Bogus, MD  Cardiologist: Dr. Domenic Polite  Chief Complaint  Patient presents with  . Atrial Fibrillation    History of Present Illness:  Diane Cohen is a 70 y.o. female with history of PAF on amiodarone Xarelto and Toprol. She last saw Dr. Domenic Polite 12/18/15 and was back in atrial fibrillation at 126 bpm. He increased her amiodarone to 200 mg twice daily for a week and then down to 200 mg daily. She is back today to see if elective cardioversion is necessary. She also has essential hypertension, hyperlipidemia diet managed, and diastolic heart failure.  Patient comes in today noting that her heart rate is still out of rhythm. She is still in atrial fibrillation at 118 bpm. She does have some more swelling in her leg today and some dyspnea on exertion.    Past Medical History:  Diagnosis Date  . Atrial fibrillation ([place])   . Chronic diastolic heart failure ([place])   . High cholesterol   . Hypertension   . Hypothyroidism   . Pneumonia   . TIA (transient ischemic attack)     Past Surgical History:  Procedure Laterality Date  . ABDOMINAL HYSTERECTOMY    . BREAST SURGERY    . CHOLECYSTECTOMY    . coloscopy      Current Medications: Outpatient Medications Prior to Visit  Medication Sig Dispense Refill  . acetaminophen (TYLENOL) 500 MG tablet Take 1,000 mg by mouth every 6 (six) hours as needed.    Marland Kitchen albuterol (PROVENTIL HFA;VENTOLIN HFA) 108 (90 BASE) MCG/ACT inhaler Inhale 1-2 puffs into the lungs every 6 (six) hours as needed for wheezing or shortness of breath.    Marland Kitchen amLODipine (NORVASC) 5 MG tablet Take 5 mg by mouth daily.    . Calcium Carbonate-Vit D-Min (CALTRATE PLUS PO) Take by mouth 2 (two) times daily.    . Cyanocobalamin (B-12 COMPLIANCE INJECTION IJ) Inject as directed.    . DULoxetine (CYMBALTA) 60 MG capsule TK 1 C PO QD  6  . estradiol (ESTRACE) 1  MG tablet Take 1 tablet (1 mg total) by mouth at bedtime. 90 tablet 3  . levothyroxine (SYNTHROID, LEVOTHROID) 100 MCG tablet Take 100 mcg by mouth daily before breakfast.    . lisinopril (PRINIVIL,ZESTRIL) 20 MG tablet Take 1 tablet (20 mg total) by mouth daily.    . magnesium oxide (MAG-OX) 400 MG tablet Take 400 mg by mouth daily.    . metoprolol succinate (TOPROL-XL) 100 MG 24 hr tablet Take 100 mg by mouth daily. Take with or immediately following a meal.    . Multiple Vitamin (MULTIVITAMIN) tablet Take 1 tablet by mouth daily.    . potassium chloride SA (K-DUR,KLOR-CON) 20 MEQ tablet Take 2 tablets (40 mEq total) by mouth daily. 180 tablet 3  . rizatriptan (MAXALT-MLT) 10 MG disintegrating tablet Take 10 mg by mouth as needed for migraine. Reported on 04/30/2015    . torsemide (DEMADEX) 20 MG tablet TAKE 2 TABLETS(40 MG) BY MOUTH EVERY MORNING 60 tablet 3  . XARELTO 20 MG TABS tablet TAKE 1 TABLET BY MOUTH DAILY WITH DINNER 30 tablet 11  . amiodarone (PACERONE) 200 MG tablet Take 1 tablet (200 mg total) by mouth 2 (two) times daily. For one week; then reduce to 200 mg daily 45 tablet 0   No facility-administered medications prior to visit.  Allergies:   Levaquin [levofloxacin] and Sulfa antibiotics   Social History   Social History  . Marital status: Married    Spouse name: N/A  . Number of children: N/A  . Years of education: N/A   Social History Main Topics  . Smoking status: Former Smoker    Packs/day: 0.50    Types: Cigarettes    Quit date: 01/22/1973  . Smokeless tobacco: Never Used  . Alcohol use No  . Drug use: No  . Sexual activity: Yes    Birth control/ protection: Surgical   Other Topics Concern  . None   Social History Narrative  . None     Family History:  The patient's family history includes Hypertension in her father.   ROS:   Please see the history of present illness.    Review of Systems  Constitution: Negative.  HENT: Negative.   Eyes:  Negative.   Cardiovascular: Positive for dyspnea on exertion, irregular heartbeat, leg swelling and palpitations.  Respiratory: Negative.   Hematologic/Lymphatic: Negative.   Musculoskeletal: Negative.  Negative for joint pain.  Gastrointestinal: Negative.   Genitourinary: Negative.   Neurological: Negative.    All other systems reviewed and are negative.   PHYSICAL EXAM:   VS:  BP 122/84   Pulse 98   Ht 5\' 1"  (1.549 m)   Wt 189 lb (85.7 kg)   SpO2 99%   BMI 35.71 kg/m   Physical Exam  GEN: Well nourished, well developed, in no acute distress Neck: Slight increase JVD, no carotid bruits, or masses Cardiac: Irregular irregular at 118 bpm, no murmurs, rubs, or gallops  Respiratory: Decreased breath sounds but clear to auscultation bilaterally, normal work of breathing GI: soft, nontender, nondistended, + BS Ext: +1 edema bilaterally left greater than right, without cyanosis, clubbing,  Good distal pulses bilaterally MS: no deformity or atrophy Skin: warm and dry, no rash Psych: euthymic mood, full affect  Wt Readings from Last 3 Encounters:  01/22/16 189 lb (85.7 kg)  12/18/15 187 lb (84.8 kg)  05/21/15 189 lb (85.7 kg)      Studies/Labs Reviewed:   EKG:  EKG is ordered today.  The ekg ordered today demonstrates Atrial fibrillation 118 bpm  Recent Labs: 03/06/2015: B Natriuretic Peptide 183.0; Hemoglobin 12.8; Platelets 263 05/20/2015: BUN 22; Creat 1.11; Potassium 4.4; Sodium 138 05/21/2015: ALT 12; TSH 1.85   Lipid Panel No results found for: CHOL, TRIG, HDL, CHOLHDL, VLDL, LDLCALC, LDLDIRECT  Additional studies/ records that were reviewed today include:  Echocardiogram 04/11/2015: Study Conclusions   - Left ventricle: The cavity size was normal. Wall thickness was   increased in a pattern of mild LVH. Systolic function was normal.   The estimated ejection fraction was in the range of 60% to 65%.   Wall motion was normal; there were no regional wall motion    abnormalities. Features are consistent with a pseudonormal left   ventricular filling pattern, with concomitant abnormal relaxation   and increased filling pressure (grade 2 diastolic dysfunction). - Mitral valve: There was mild regurgitation. - Right atrium: The atrium was mildly dilated. There was the   appearance of a Chiari network. - Tricuspid valve: There was mild regurgitation. - Pulmonary arteries: PA peak pressure: 25 mm Hg (S). - Pericardium, extracardiac: There was no pericardial effusion.   Impressions:   - Mild LVH with LVEF 60-65% and grade 2 diastolic dysfunction with   increased filling pressure. Mild mitral regurgitation. Mild   tricuspid regurgitation with normal PASP  25 mmHg. Chiari network   noted in the right atrium.     ASSESSMENT:    1. Atrial fibrillation, unspecified type ([place])   2. Acute on chronic diastolic heart failure ([place])   3. Essential hypertension      PLAN:  In order of problems listed above:  Atrial fibrillation with rapid ventricular rate despite increase in amiodarone for a week. Will increase Toprol XL to 100 mg in the morning 50 in the afternoon. She has not missed any Xarelto doses. We'll schedule cardioversion tomorrow at 10 AM with Dr. Domenic Polite.  risks and benefits explained to patient. Patient is agreeable.  Acute on chronic diastolic heart failure with increased edema suspect secondary to the rapid atrial fibrillation. We'll have her take extra Demadex and potassium for 2 days.  Essential hypertension controlled    Medication Adjustments/Labs and Tests Ordered: Current medicines are reviewed at length with the patient today.  Concerns regarding medicines are outlined above.  Medication changes, Labs and Tests ordered today are listed in the Patient Instructions below. Patient Instructions  Your physician has recommended that you have a Cardioversion (DCCV). Electrical Cardioversion uses a jolt of electricity to your heart either  through paddles or wired patches attached to your chest. This is a controlled, usually prescheduled, procedure. Defibrillation is done under light anesthesia in the hospital, and you usually go home the day of the procedure. This is done to get your heart back into a normal rhythm. You are not awake for the procedure. Please see the instruction sheet given to you today.  Your physician recommends that you return for lab work in: Today   If you need a refill on your cardiac medications before your next appointment, please call your pharmacy.  Thank you for choosing [facility]!       Signed, Ermalinda Barrios, PA-C  01/22/2016 1:57 PM    [facility] [address], [place], [place]  [address] Phone: [phone]; Fax: [phone]

## 2016-01-23 ENCOUNTER — Encounter (HOSPITAL_COMMUNITY): Payer: Self-pay | Admitting: *Deleted

## 2016-01-23 ENCOUNTER — Ambulatory Visit (HOSPITAL_COMMUNITY): Payer: Medicare PPO | Admitting: Certified Registered Nurse Anesthetist

## 2016-01-23 ENCOUNTER — Ambulatory Visit (HOSPITAL_COMMUNITY)
Admission: RE | Admit: 2016-01-23 | Discharge: 2016-01-23 | Disposition: A | Discharge status: Home / Self Care | Payer: Medicare PPO | Source: Ambulatory Visit | Attending: Cardiology | Admitting: Cardiology

## 2016-01-23 ENCOUNTER — Encounter (HOSPITAL_COMMUNITY)
Admission: RE | Disposition: A | Discharge status: Home / Self Care | Payer: Self-pay | Source: Ambulatory Visit | Attending: Cardiology

## 2016-01-23 DIAGNOSIS — K219 Gastro-esophageal reflux disease without esophagitis: Secondary | ICD-10-CM | POA: Diagnosis not present

## 2016-01-23 DIAGNOSIS — F419 Anxiety disorder, unspecified: Secondary | ICD-10-CM | POA: Insufficient documentation

## 2016-01-23 DIAGNOSIS — I4891 Unspecified atrial fibrillation: Secondary | ICD-10-CM | POA: Diagnosis not present

## 2016-01-23 DIAGNOSIS — I110 Hypertensive heart disease with heart failure: Secondary | ICD-10-CM | POA: Insufficient documentation

## 2016-01-23 DIAGNOSIS — Z7901 Long term (current) use of anticoagulants: Secondary | ICD-10-CM | POA: Diagnosis not present

## 2016-01-23 DIAGNOSIS — Z8673 Personal history of transient ischemic attack (TIA), and cerebral infarction without residual deficits: Secondary | ICD-10-CM | POA: Insufficient documentation

## 2016-01-23 DIAGNOSIS — Z79899 Other long term (current) drug therapy: Secondary | ICD-10-CM | POA: Insufficient documentation

## 2016-01-23 DIAGNOSIS — E039 Hypothyroidism, unspecified: Secondary | ICD-10-CM | POA: Insufficient documentation

## 2016-01-23 DIAGNOSIS — I481 Persistent atrial fibrillation: Secondary | ICD-10-CM | POA: Insufficient documentation

## 2016-01-23 DIAGNOSIS — I5033 Acute on chronic diastolic (congestive) heart failure: Secondary | ICD-10-CM | POA: Diagnosis not present

## 2016-01-23 DIAGNOSIS — Z87891 Personal history of nicotine dependence: Secondary | ICD-10-CM | POA: Insufficient documentation

## 2016-01-23 DIAGNOSIS — F329 Major depressive disorder, single episode, unspecified: Secondary | ICD-10-CM | POA: Diagnosis not present

## 2016-01-23 HISTORY — PX: CARDIOVERSION: SHX1299

## 2016-01-23 SURGERY — CARDIOVERSION
Anesthesia: Monitor Anesthesia Care

## 2016-01-23 MED ORDER — PROPOFOL 500 MG/50ML IV EMUL
INTRAVENOUS | Status: DC | PRN
  Administered 2016-01-23: 100 ug/kg/min via INTRAVENOUS

## 2016-01-23 MED ORDER — LACTATED RINGERS IV SOLN
INTRAVENOUS | Status: DC
  Administered 2016-01-23: 10:00:00 via INTRAVENOUS

## 2016-01-23 MED ORDER — SODIUM CHLORIDE 0.9 % IV SOLN: 250.0000 mL | INTRAVENOUS | Status: DC

## 2016-01-23 MED ORDER — MIDAZOLAM HCL 2 MG/2ML IJ SOLN
1.0000 mg | INTRAMUSCULAR | Status: DC | PRN
  Administered 2016-01-23: 2 mg via INTRAVENOUS

## 2016-01-23 MED ORDER — SODIUM CHLORIDE 0.9% FLUSH: 3.0000 mL | Freq: Two times a day (BID) | INTRAVENOUS | Status: DC

## 2016-01-23 MED ORDER — FENTANYL CITRATE (PF) 100 MCG/2ML IJ SOLN
INTRAMUSCULAR | Status: AC
  Filled 2016-01-23: qty 2

## 2016-01-23 MED ORDER — MIDAZOLAM HCL 2 MG/2ML IJ SOLN
INTRAMUSCULAR | Status: AC
  Filled 2016-01-23: qty 2

## 2016-01-23 MED ORDER — FENTANYL CITRATE (PF) 100 MCG/2ML IJ SOLN
25.0000 ug | Freq: Once | INTRAMUSCULAR | Status: AC
  Administered 2016-01-23: 25 ug via INTRAVENOUS

## 2016-01-23 MED ORDER — SODIUM CHLORIDE 0.9% FLUSH: 3.0000 mL | INTRAVENOUS | Status: DC | PRN

## 2016-01-23 MED ORDER — PROPOFOL 10 MG/ML IV BOLUS
INTRAVENOUS | Status: AC
  Filled 2016-01-23: qty 40

## 2016-01-23 NOTE — Anesthesia Preprocedure Evaluation (Signed)
Anesthesia Evaluation  Patient identified by MRN, date of birth, ID band Patient awake    Reviewed: Allergy & Precautions, NPO status , Patient's Chart, lab work & pertinent test results  Airway Mallampati: II  TM Distance: >3 FB     Dental  (+) Teeth Intact, Implants   Pulmonary former smoker,    breath sounds clear to auscultation       Cardiovascular hypertension, Pt. on medications +CHF  + dysrhythmias Atrial Fibrillation  Rhythm:Irregular Rate:Normal     Neuro/Psych PSYCHIATRIC DISORDERS Anxiety Depression TIA   GI/Hepatic GERD  Medicated,  Endo/Other  Hypothyroidism   Renal/GU      Musculoskeletal   Abdominal   Peds  Hematology   Anesthesia Other Findings   Reproductive/Obstetrics                             Anesthesia Physical Anesthesia Plan  ASA: III  Anesthesia Plan: MAC   Post-op Pain Management:    Induction: Intravenous  Airway Management Planned: Simple Face Mask  Additional Equipment:   Intra-op Plan:   Post-operative Plan:   Informed Consent: I have reviewed the patients History and Physical, chart, labs and discussed the procedure including the risks, benefits and alternatives for the proposed anesthesia with the patient or authorized representative who has indicated his/her understanding and acceptance.     Plan Discussed with:   Anesthesia Plan Comments:         Anesthesia Quick Evaluation

## 2016-01-23 NOTE — H&P (View-Only) (Signed)
Cardiology Office Note    Date:  01/22/2016   ID:  Diane Cohen, DOB 1945-11-29, MRN [id number]  PCP:  Alonza Bogus, MD  Cardiologist: Dr. Domenic Polite  Chief Complaint  Patient presents with  . Atrial Fibrillation    History of Present Illness:  Diane Cohen is a 70 y.o. female with history of PAF on amiodarone Xarelto and Toprol. She last saw Dr. Domenic Polite 12/18/15 and was back in atrial fibrillation at 126 bpm. He increased her amiodarone to 200 mg twice daily for a week and then down to 200 mg daily. She is back today to see if elective cardioversion is necessary. She also has essential hypertension, hyperlipidemia diet managed, and diastolic heart failure.  Patient comes in today noting that her heart rate is still out of rhythm. She is still in atrial fibrillation at 118 bpm. She does have some more swelling in her leg today and some dyspnea on exertion.    Past Medical History:  Diagnosis Date  . Atrial fibrillation ([place])   . Chronic diastolic heart failure ([place])   . High cholesterol   . Hypertension   . Hypothyroidism   . Pneumonia   . TIA (transient ischemic attack)     Past Surgical History:  Procedure Laterality Date  . ABDOMINAL HYSTERECTOMY    . BREAST SURGERY    . CHOLECYSTECTOMY    . coloscopy      Current Medications: Outpatient Medications Prior to Visit  Medication Sig Dispense Refill  . acetaminophen (TYLENOL) 500 MG tablet Take 1,000 mg by mouth every 6 (six) hours as needed.    Marland Kitchen albuterol (PROVENTIL HFA;VENTOLIN HFA) 108 (90 BASE) MCG/ACT inhaler Inhale 1-2 puffs into the lungs every 6 (six) hours as needed for wheezing or shortness of breath.    Marland Kitchen amLODipine (NORVASC) 5 MG tablet Take 5 mg by mouth daily.    . Calcium Carbonate-Vit D-Min (CALTRATE PLUS PO) Take by mouth 2 (two) times daily.    . Cyanocobalamin (B-12 COMPLIANCE INJECTION IJ) Inject as directed.    . DULoxetine (CYMBALTA) 60 MG capsule TK 1 C PO QD  6  . estradiol (ESTRACE) 1  MG tablet Take 1 tablet (1 mg total) by mouth at bedtime. 90 tablet 3  . levothyroxine (SYNTHROID, LEVOTHROID) 100 MCG tablet Take 100 mcg by mouth daily before breakfast.    . lisinopril (PRINIVIL,ZESTRIL) 20 MG tablet Take 1 tablet (20 mg total) by mouth daily.    . magnesium oxide (MAG-OX) 400 MG tablet Take 400 mg by mouth daily.    . metoprolol succinate (TOPROL-XL) 100 MG 24 hr tablet Take 100 mg by mouth daily. Take with or immediately following a meal.    . Multiple Vitamin (MULTIVITAMIN) tablet Take 1 tablet by mouth daily.    . potassium chloride SA (K-DUR,KLOR-CON) 20 MEQ tablet Take 2 tablets (40 mEq total) by mouth daily. 180 tablet 3  . rizatriptan (MAXALT-MLT) 10 MG disintegrating tablet Take 10 mg by mouth as needed for migraine. Reported on 04/30/2015    . torsemide (DEMADEX) 20 MG tablet TAKE 2 TABLETS(40 MG) BY MOUTH EVERY MORNING 60 tablet 3  . XARELTO 20 MG TABS tablet TAKE 1 TABLET BY MOUTH DAILY WITH DINNER 30 tablet 11  . amiodarone (PACERONE) 200 MG tablet Take 1 tablet (200 mg total) by mouth 2 (two) times daily. For one week; then reduce to 200 mg daily 45 tablet 0   No facility-administered medications prior to visit.  Allergies:   Levaquin [levofloxacin] and Sulfa antibiotics   Social History   Social History  . Marital status: Married    Spouse name: N/A  . Number of children: N/A  . Years of education: N/A   Social History Main Topics  . Smoking status: Former Smoker    Packs/day: 0.50    Types: Cigarettes    Quit date: 01/22/1973  . Smokeless tobacco: Never Used  . Alcohol use No  . Drug use: No  . Sexual activity: Yes    Birth control/ protection: Surgical   Other Topics Concern  . None   Social History Narrative  . None     Family History:  The patient's family history includes Hypertension in her father.   ROS:   Please see the history of present illness.    Review of Systems  Constitution: Negative.  HENT: Negative.   Eyes:  Negative.   Cardiovascular: Positive for dyspnea on exertion, irregular heartbeat, leg swelling and palpitations.  Respiratory: Negative.   Hematologic/Lymphatic: Negative.   Musculoskeletal: Negative.  Negative for joint pain.  Gastrointestinal: Negative.   Genitourinary: Negative.   Neurological: Negative.    All other systems reviewed and are negative.   PHYSICAL EXAM:   VS:  BP 122/84   Pulse 98   Ht 5\' 1"  (1.549 m)   Wt 189 lb (85.7 kg)   SpO2 99%   BMI 35.71 kg/m   Physical Exam  GEN: Well nourished, well developed, in no acute distress Neck: Slight increase JVD, no carotid bruits, or masses Cardiac: Irregular irregular at 118 bpm, no murmurs, rubs, or gallops  Respiratory: Decreased breath sounds but clear to auscultation bilaterally, normal work of breathing GI: soft, nontender, nondistended, + BS Ext: +1 edema bilaterally left greater than right, without cyanosis, clubbing,  Good distal pulses bilaterally MS: no deformity or atrophy Skin: warm and dry, no rash Psych: euthymic mood, full affect  Wt Readings from Last 3 Encounters:  01/22/16 189 lb (85.7 kg)  12/18/15 187 lb (84.8 kg)  05/21/15 189 lb (85.7 kg)      Studies/Labs Reviewed:   EKG:  EKG is ordered today.  The ekg ordered today demonstrates Atrial fibrillation 118 bpm  Recent Labs: 03/06/2015: B Natriuretic Peptide 183.0; Hemoglobin 12.8; Platelets 263 05/20/2015: BUN 22; Creat 1.11; Potassium 4.4; Sodium 138 05/21/2015: ALT 12; TSH 1.85   Lipid Panel No results found for: CHOL, TRIG, HDL, CHOLHDL, VLDL, LDLCALC, LDLDIRECT  Additional studies/ records that were reviewed today include:  Echocardiogram 04/11/2015: Study Conclusions   - Left ventricle: The cavity size was normal. Wall thickness was   increased in a pattern of mild LVH. Systolic function was normal.   The estimated ejection fraction was in the range of 60% to 65%.   Wall motion was normal; there were no regional wall motion    abnormalities. Features are consistent with a pseudonormal left   ventricular filling pattern, with concomitant abnormal relaxation   and increased filling pressure (grade 2 diastolic dysfunction). - Mitral valve: There was mild regurgitation. - Right atrium: The atrium was mildly dilated. There was the   appearance of a Chiari network. - Tricuspid valve: There was mild regurgitation. - Pulmonary arteries: PA peak pressure: 25 mm Hg (S). - Pericardium, extracardiac: There was no pericardial effusion.   Impressions:   - Mild LVH with LVEF 60-65% and grade 2 diastolic dysfunction with   increased filling pressure. Mild mitral regurgitation. Mild   tricuspid regurgitation with normal PASP  25 mmHg. Chiari network   noted in the right atrium.     ASSESSMENT:    1. Atrial fibrillation, unspecified type ([place])   2. Acute on chronic diastolic heart failure ([place])   3. Essential hypertension      PLAN:  In order of problems listed above:  Atrial fibrillation with rapid ventricular rate despite increase in amiodarone for a week. Will increase Toprol XL to 100 mg in the morning 50 in the afternoon. She has not missed any Xarelto doses. We'll schedule cardioversion tomorrow at 10 AM with Dr. Domenic Polite.  risks and benefits explained to patient. Patient is agreeable.  Acute on chronic diastolic heart failure with increased edema suspect secondary to the rapid atrial fibrillation. We'll have her take extra Demadex and potassium for 2 days.  Essential hypertension controlled    Medication Adjustments/Labs and Tests Ordered: Current medicines are reviewed at length with the patient today.  Concerns regarding medicines are outlined above.  Medication changes, Labs and Tests ordered today are listed in the Patient Instructions below. Patient Instructions  Your physician has recommended that you have a Cardioversion (DCCV). Electrical Cardioversion uses a jolt of electricity to your heart either  through paddles or wired patches attached to your chest. This is a controlled, usually prescheduled, procedure. Defibrillation is done under light anesthesia in the hospital, and you usually go home the day of the procedure. This is done to get your heart back into a normal rhythm. You are not awake for the procedure. Please see the instruction sheet given to you today.  Your physician recommends that you return for lab work in: Today   If you need a refill on your cardiac medications before your next appointment, please call your pharmacy.  Thank you for choosing [facility]!       Signed, Ermalinda Barrios, PA-C  01/22/2016 1:57 PM    [facility] [address], [place], [place]  [address] Phone: [phone]; Fax: [phone]

## 2016-01-23 NOTE — CV Procedure (Signed)
Elective direct current cardioversion  Indication: Persistent atrial fibrillation  Description of procedure: Medications and lab work reviewed. Informed consent obtained. Patient taken to the procedure suite where timeout was performed. Anterior and posterior pads were placed and connected to a biphasic defibrillator. Sedation was performed and monitored by the anesthesia service, please refer to their records. Deep sedation was achieved with propofol. A single synchronized shock at 120 J was delivered with successful restoration of sinus rhythm. Patient remained hemodynamically stable throughout. Post-procedure ECG confirmed sinus rhythm. There were no immediate complications.  Satira Sark, M.D., F.A.C.C.

## 2016-01-23 NOTE — Addendum Note (Signed)
Addendum  created 01/23/16 1036 by Raenette Rover, CRNA   Charge Capture section accepted

## 2016-01-23 NOTE — Anesthesia Postprocedure Evaluation (Signed)
Anesthesia Post Note  Patient: Diane Cohen  Procedure(s) Performed: Procedure(s) (LRB): CARDIOVERSION (N/A)  Patient location during evaluation: PACU Anesthesia Type: MAC Level of consciousness: awake, awake and alert, oriented and patient cooperative Pain management: pain level controlled Vital Signs Assessment: post-procedure vital signs reviewed and stable Respiratory status: nonlabored ventilation, spontaneous breathing, respiratory function stable and patient connected to nasal cannula oxygen Cardiovascular status: stable Postop Assessment: no headache, no backache and no signs of nausea or vomiting Anesthetic complications: no    Last Vitals:  Vitals:   01/23/16 1024 01/23/16 1027  BP:  (P) 101/60  Pulse: 66 (P) 69  Resp:  (P) 18  Temp:  (P) 36.5 C    Last Pain:  Vitals:   01/23/16 1027  TempSrc:   PainSc: (P) 0-No pain                 Caresse Sedivy L

## 2016-01-23 NOTE — Discharge Instructions (Signed)
PATIENT INSTRUCTIONS POST-ANESTHESIA  IMMEDIATELY FOLLOWING SURGERY:  Do not drive or operate machinery for the first twenty four hours after surgery.  Do not make any important decisions for twenty four hours after surgery or while taking narcotic pain medications or sedatives.  If you develop intractable nausea and vomiting or a severe headache please notify your doctor immediately.  FOLLOW-UP:  Please make an appointment with your surgeon as instructed. You do not need to follow up with anesthesia unless specifically instructed to do so.  WOUND CARE INSTRUCTIONS (if applicable):  Keep a dry clean dressing on the anesthesia/puncture wound site if there is drainage.  Once the wound has quit draining you may leave it open to air.  Generally you should leave the bandage intact for twenty four hours unless there is drainage.  If the epidural site drains for more than 36-48 hours please call the anesthesia department.  QUESTIONS?:  Please feel free to call your physician or the hospital operator if you have any questions, and they will be happy to assist you.                 P  Electrical Cardioversion, Care After This sheet gives you information about how to care for yourself after your procedure. Your health care provider may also give you more specific instructions. If you have problems or questions, contact your health care provider. What can I expect after the procedure? After the procedure, it is common to have:  Some redness on the skin where the shocks were given. Follow these instructions at home:  Do not drive for 24 hours if you were given a medicine to help you relax (sedative).  Take over-the-counter and prescription medicines only as told by your health care provider.  Ask your health care provider how to check your pulse. Check it often.  Rest for 48 hours after the procedure or as told by your health care provider.  Avoid or limit your caffeine use as told by your  health care provider. Contact a health care provider if:  You feel like your heart is beating too quickly or your pulse is not regular.  You have a serious muscle cramp that does not go away. Get help right away if:  You have discomfort in your chest.  You are dizzy or you feel faint.  You have trouble breathing or you are short of breath.  Your speech is slurred.  You have trouble moving an arm or leg on one side of your body.  Your fingers or toes turn cold or blue. This information is not intended to replace advice given to you by your health care provider. Make sure you discuss any questions you have with your health care provider. Document Released: 12/14/2012 Document Revised: 09/27/2015 Document Reviewed: 08/30/2015 Elsevier Interactive Patient Education  2017 [employer].

## 2016-01-23 NOTE — Interval H&P Note (Signed)
History and Physical Interval Note:  01/23/2016 10:06 AM  Patient presents for elective DCCV for persistent atrial fibrillation. No interval change since office visit yesterday with Ms. Diane Cohen. I reviewed her labwork and medications. She confirms compliance with medications.  She is ready to proceed.  Satira Sark, M.D., F.A.C.C.

## 2016-01-23 NOTE — Transfer of Care (Signed)
Immediate Anesthesia Transfer of Care Note  Patient: Diane Cohen  Procedure(s) Performed: Procedure(s): CARDIOVERSION (N/A)  Patient Location: PACU  Anesthesia Type:MAC  Level of Consciousness: awake, alert , oriented and patient cooperative  Airway & Oxygen Therapy: Patient Spontanous Breathing and Patient connected to nasal cannula oxygen  Post-op Assessment: Report given to RN and Post -op Vital signs reviewed and stable  Post vital signs: Reviewed and stable  Last Vitals:  Vitals:   01/23/16 1024 01/23/16 1027  BP:  (P) 101/60  Pulse: 66 (P) 69  Resp:  (P) 18  Temp:  (P) 36.5 C    Last Pain:  Vitals:   01/23/16 1027  TempSrc:   PainSc: (P) 0-No pain      Patients Stated Pain Goal: 6 ([id number])  Complications: No apparent anesthesia complications

## 2016-01-24 ENCOUNTER — Encounter (HOSPITAL_COMMUNITY): Payer: Self-pay | Admitting: Cardiology

## 2016-02-12 ENCOUNTER — Encounter: Payer: Self-pay | Admitting: Physician Assistant

## 2016-02-12 ENCOUNTER — Ambulatory Visit (INDEPENDENT_AMBULATORY_CARE_PROVIDER_SITE_OTHER): Payer: Medicare PPO | Admitting: Physician Assistant

## 2016-02-12 VITALS — BP 142/72 | HR 72 | Ht 61.0 in | Wt 189.0 lb

## 2016-02-12 DIAGNOSIS — I5032 Chronic diastolic (congestive) heart failure: Secondary | ICD-10-CM | POA: Diagnosis not present

## 2016-02-12 DIAGNOSIS — I10 Essential (primary) hypertension: Secondary | ICD-10-CM

## 2016-02-12 DIAGNOSIS — I4891 Unspecified atrial fibrillation: Secondary | ICD-10-CM

## 2016-02-12 NOTE — Progress Notes (Signed)
Cardiology Office Note    Date:  02/12/2016   ID:  Diane Cohen, DOB 10-Oct-1945, MRN [id number]  PCP:  Alonza Bogus, MD  Cardiologist: Dr. Domenic Polite  Chief Complaint  Patient presents with  . Follow-up    History of Present Illness:  Diane Cohen is a 70 y.o. female with history of PAF on amiodarone Xarelto and Toprol. She last saw Dr. Domenic Polite 12/18/15 and was back in atrial fibrillation at 126 bpm. He increased her amiodarone to 200 mg twice daily for a week and then down to 200 mg daily. She is back today to see if elective cardioversion is necessary. She also has essential hypertension, hyperlipidemia diet managed, and diastolic heart failure.  I saw the patient 01/22/16 and she was still in atrial fibrillation 118 bpm. She also had more swelling in her legs and dyspnea on exertion. She underwent DC CV 01/23/16 and converted to normal sinus rhythm.  Overall she is feeling much better. She feels like she still in normal rhythm. She has occasional skipped beats but doesn't think she's been back in atrial fibrillation. She has mild ankle edema that is chronic.       Past Medical History:  Diagnosis Date  . Anxiety   . Atrial fibrillation ([place])   . CHF (congestive heart failure) ([place])   . Chronic diastolic heart failure ([place])   . Depression   . High cholesterol   . Hypertension   . Hypothyroidism   . Pneumonia   . TIA (transient ischemic attack)     Past Surgical History:  Procedure Laterality Date  . ABDOMINAL HYSTERECTOMY    . BREAST SURGERY     biopsy  . CARDIOVERSION N/A 01/23/2016   Procedure: CARDIOVERSION;  Surgeon: Satira Sark, MD;  Location: [facility];  Service: Cardiovascular;  Laterality: N/A;  . CHOLECYSTECTOMY    . coloscopy      Current Medications: Outpatient Medications Prior to Visit  Medication Sig Dispense Refill  . acetaminophen (TYLENOL) 500 MG tablet Take 1,000 mg by mouth every 6 (six) hours as needed.    Marland Kitchen albuterol  (PROVENTIL HFA;VENTOLIN HFA) 108 (90 BASE) MCG/ACT inhaler Inhale 1-2 puffs into the lungs every 6 (six) hours as needed for wheezing or shortness of breath.    Marland Kitchen amLODipine (NORVASC) 5 MG tablet Take 5 mg by mouth daily.    . Calcium Carbonate-Vit D-Min (CALTRATE PLUS PO) Take by mouth 2 (two) times daily.    . Cyanocobalamin (B-12 COMPLIANCE INJECTION IJ) Inject as directed.    . DULoxetine (CYMBALTA) 60 MG capsule TK 1 C PO QD  6  . estradiol (ESTRACE) 1 MG tablet Take 1 tablet (1 mg total) by mouth at bedtime. 90 tablet 3  . levothyroxine (SYNTHROID, LEVOTHROID) 100 MCG tablet Take 100 mcg by mouth daily before breakfast.    . lisinopril (PRINIVIL,ZESTRIL) 20 MG tablet Take 1 tablet (20 mg total) by mouth daily.    . magnesium oxide (MAG-OX) 400 MG tablet Take 400 mg by mouth daily.    . metoprolol succinate (TOPROL-XL) 100 MG 24 hr tablet Take 100 mg by mouth daily. Take with or immediately following a meal.    . Multiple Vitamin (MULTIVITAMIN) tablet Take 1 tablet by mouth daily.    Marland Kitchen omeprazole (PRILOSEC) 40 MG capsule 1 capsule daily.    . potassium chloride SA (K-DUR,KLOR-CON) 20 MEQ tablet Take 2 tablets (40 mEq total) by mouth daily. 180 tablet 3  . rizatriptan (MAXALT-MLT) 10 MG  disintegrating tablet Take 10 mg by mouth as needed for migraine. Reported on 04/30/2015    . torsemide (DEMADEX) 20 MG tablet TAKE 2 TABLETS(40 MG) BY MOUTH EVERY MORNING 60 tablet 3  . XARELTO 20 MG TABS tablet TAKE 1 TABLET BY MOUTH DAILY WITH DINNER 30 tablet 11  . amiodarone (PACERONE) 200 MG tablet Take 1 tablet (200 mg total) by mouth 2 (two) times daily. For one week; then reduce to 200 mg daily (Patient taking differently: Take 200 mg by mouth daily. For one week; then reduce to 200 mg daily) 45 tablet 0   No facility-administered medications prior to visit.      Allergies:   Levaquin [levofloxacin] and Sulfa antibiotics   Social History   Social History  . Marital status: Married    Spouse  name: N/A  . Number of children: N/A  . Years of education: N/A   Social History Main Topics  . Smoking status: Former Smoker    Packs/day: 0.50    Types: Cigarettes    Quit date: 01/22/1973  . Smokeless tobacco: Never Used  . Alcohol use No  . Drug use: No  . Sexual activity: Yes    Birth control/ protection: Surgical   Other Topics Concern  . None   Social History Narrative  . None     Family History:  The patient's   family history includes Hypertension in her father.   ROS:   Please see the history of present illness.    Review of Systems  Constitution: Negative.  HENT: Negative.   Eyes: Negative.   Cardiovascular: Positive for dyspnea on exertion, leg swelling and palpitations.  Respiratory: Negative.   Hematologic/Lymphatic: Negative.   Musculoskeletal: Negative.  Negative for joint pain.  Gastrointestinal: Negative.   Genitourinary: Negative.   Neurological: Negative.    All other systems reviewed and are negative.   PHYSICAL EXAM:   VS:  BP (!) 142/72   Pulse 72   Ht 5\' 1"  (1.549 m)   Wt 189 lb (85.7 kg)   SpO2 97%   BMI 35.71 kg/m   Physical Exam  [id number], in no acute distress  Neck: no JVD, carotid bruits, or masses Cardiac:RRR; no murmurs, rubs, or gallops  Respiratory:  clear to auscultation bilaterally, normal work of breathing GI: soft, nontender, nondistended, + BS Ext: Trace of edema on the left ankle without cyanosis, clubbing, Good distal pulses bilaterally MS: no deformity or atrophy  Skin: warm and dry, no rash Neuro:  Alert and Oriented x 3, Strength and sensation are intact Psych: euthymic mood, full affect  Wt Readings from Last 3 Encounters:  02/12/16 189 lb (85.7 kg)  01/22/16 190 lb (86.2 kg)  01/22/16 189 lb (85.7 kg)      Studies/Labs Reviewed:   EKG:  EKG is ordered today.  The ekg ordered today demonstrates Normal sinus rhythm  Recent Labs: 03/06/2015: B Natriuretic Peptide 183.0 05/21/2015: ALT 12; TSH  1.85 01/22/2016: BUN 19; Creatinine, Ser 1.11; Hemoglobin 11.2; Platelets 288; Potassium 3.6; Sodium 133   Lipid Panel No results found for: CHOL, TRIG, HDL, CHOLHDL, VLDL, LDLCALC, LDLDIRECT  Additional studies/ records that were reviewed today include:   Echocardiogram 04/11/2015: Study Conclusions   - Left ventricle: The cavity size was normal. Wall thickness was   increased in a pattern of mild LVH. Systolic function was normal.   The estimated ejection fraction was in the range of 60% to 65%.   Wall motion was normal; there were no  regional wall motion   abnormalities. Features are consistent with a pseudonormal left   ventricular filling pattern, with concomitant abnormal relaxation   and increased filling pressure (grade 2 diastolic dysfunction). - Mitral valve: There was mild regurgitation. - Right atrium: The atrium was mildly dilated. There was the   appearance of a Chiari network. - Tricuspid valve: There was mild regurgitation. - Pulmonary arteries: PA peak pressure: 25 mm Hg (S). - Pericardium, extracardiac: There was no pericardial effusion.   Impressions:   - Mild LVH with LVEF 60-65% and grade 2 diastolic dysfunction with   increased filling pressure. Mild mitral regurgitation. Mild   tricuspid regurgitation with normal PASP 25 mmHg. Chiari network   noted in the right atrium.        ASSESSMENT:    1. Atrial fibrillation, unspecified type ([place])   2. Essential hypertension, benign   3. Chronic diastolic heart failure (H[facility])      PLAN:  In order of problems listed above:  Atrial fibrillation status post DC CV to normal sinus rhythm 01/23/16. Patient maintaining normal sinus rhythm on amiodarone, Toprol and Xarelto. Heart rate 73 bpm today. We'll not change dosages of her medications today.  Essential hypertension controlled  Chronic diastolic heart failure improved edema. Follow-up with Dr. Domenic Polite in 2 months.    Medication Adjustments/Labs and  Tests Ordered: Current medicines are reviewed at length with the patient today.  Concerns regarding medicines are outlined above.  Medication changes, Labs and Tests ordered today are listed in the Patient Instructions below. Patient Instructions  Your physician recommends that you schedule a follow-up appointment in: 2 Months with Dr.McDowell.  Your physician recommends that you continue on your current medications as directed. Please refer to the Current Medication list given to you today.  If you need a refill on your cardiac medications before your next appointment, please call your pharmacy.  Thank you for choosing [facility]!       Sumner Boast, PA-C  02/12/2016 12:05 PM    [facility] [place], [place], [place]  [address] Phone: [phone]; Fax: [phone]

## 2016-02-12 NOTE — Patient Instructions (Signed)
Your physician recommends that you schedule a follow-up appointment in: 2 Months with Dr. McDowell  Your physician recommends that you continue on your current medications as directed. Please refer to the Current Medication list given to you today.  If you need a refill on your cardiac medications before your next appointment, please call your pharmacy.  Thank you for choosing [facility]!    

## 2016-02-17 ENCOUNTER — Other Ambulatory Visit: Payer: Self-pay | Admitting: Obstetrics & Gynecology

## 2016-02-22 ENCOUNTER — Other Ambulatory Visit: Payer: Self-pay | Admitting: Adult Health

## 2016-02-22 ENCOUNTER — Other Ambulatory Visit: Payer: Self-pay | Admitting: Cardiology

## 2016-02-22 ENCOUNTER — Other Ambulatory Visit: Payer: Self-pay | Admitting: Physician Assistant

## 2016-03-05 ENCOUNTER — Other Ambulatory Visit: Payer: Self-pay | Admitting: Obstetrics & Gynecology

## 2016-03-16 ENCOUNTER — Other Ambulatory Visit: Payer: Self-pay | Admitting: Obstetrics & Gynecology

## 2016-03-26 ENCOUNTER — Other Ambulatory Visit: Payer: Self-pay | Admitting: Obstetrics & Gynecology

## 2016-04-13 ENCOUNTER — Encounter: Payer: Self-pay | Admitting: Cardiology

## 2016-04-13 NOTE — Progress Notes (Signed)
Cardiology Office Note  Date: 04/14/2016   ID: MARJARIE MCFARLAIN, DOB 1945/04/15, MRN [id number]  PCP: Alonza Bogus, MD  Primary Cardiologist: Rozann Lesches, MD   Chief Complaint  Patient presents with  . Atrial Fibrillation    History of Present Illness: Diane Cohen is a 71 y.o. female last seen in December 2017 by Ms. Bonnell Public PA-C. She had undergone successful cardioversion in November 2017 with restoration of sinus rhythm from recurrent atrial fibrillation. She presents today for follow-up, does not report any palpitations or unusual shortness of breath.  I reviewed her medications which are outlined below. Cardiac regimen includes amiodarone, Toprol-XL, and Xarelto. She has not had recent follow-up TSH and LFTs.  Past Medical History:  Diagnosis Date  . Anxiety   . Atrial fibrillation ([place])   . Chronic diastolic heart failure ([place])   . Depression   . High cholesterol   . Hypertension   . Hypothyroidism   . Pneumonia   . TIA (transient ischemic attack)     Current Outpatient Prescriptions  Medication Sig Dispense Refill  . acetaminophen (TYLENOL) 500 MG tablet Take 1,000 mg by mouth every 6 (six) hours as needed.    Marland Kitchen albuterol (PROVENTIL HFA;VENTOLIN HFA) 108 (90 BASE) MCG/ACT inhaler Inhale 1-2 puffs into the lungs every 6 (six) hours as needed for wheezing or shortness of breath.    Marland Kitchen amiodarone (PACERONE) 200 MG tablet Take 200 mg by mouth daily.    Marland Kitchen amLODipine (NORVASC) 5 MG tablet Take 5 mg by mouth daily.    . Calcium Carbonate-Vit D-Min (CALTRATE PLUS PO) Take by mouth 2 (two) times daily.    . Cyanocobalamin (B-12 COMPLIANCE INJECTION IJ) Inject as directed.    . DULoxetine (CYMBALTA) 60 MG capsule TK 1 C PO QD  6  . estradiol (ESTRACE) 1 MG tablet TAKE 1 TABLET BY MOUTH EVERY NIGHT AT BEDTIME 90 tablet 3  . levothyroxine (SYNTHROID, LEVOTHROID) 100 MCG tablet Take 100 mcg by mouth daily before breakfast.    . lisinopril (PRINIVIL,ZESTRIL) 20 MG tablet Take  1 tablet (20 mg total) by mouth daily.    . magnesium oxide (MAG-OX) 400 MG tablet Take 400 mg by mouth daily.    . metoprolol succinate (TOPROL-XL) 100 MG 24 hr tablet Take 100 mg by mouth daily. Take with or immediately following a meal.    . Multiple Vitamin (MULTIVITAMIN) tablet Take 1 tablet by mouth daily.    Marland Kitchen omeprazole (PRILOSEC) 40 MG capsule 1 capsule daily.    . potassium chloride SA (K-DUR,KLOR-CON) 20 MEQ tablet Take 2 tablets (40 mEq total) by mouth daily. 180 tablet 3  . rizatriptan (MAXALT-MLT) 10 MG disintegrating tablet Take 10 mg by mouth as needed for migraine. Reported on 04/30/2015    . torsemide (DEMADEX) 20 MG tablet TAKE 2 TABLETS(40 MG) BY MOUTH EVERY MORNING 60 tablet 6  . XARELTO 20 MG TABS tablet TAKE 1 TABLET BY MOUTH DAILY WITH DINNER 30 tablet 6   No current facility-administered medications for this visit.    Allergies:  Levaquin [levofloxacin] and Sulfa antibiotics   Social History: The patient  reports that she quit smoking about 43 years ago. Her smoking use included Cigarettes. She smoked 0.50 packs per day. She has never used smokeless tobacco. She reports that she does not drink alcohol or use drugs.   ROS:  Please see the history of present illness. Otherwise, complete review of systems is positive for none.  All other systems are  reviewed and negative.   Physical Exam: VS:  BP (!) 150/78   Pulse 83   Ht 5\' 1"  (1.549 m)   Wt 190 lb (86.2 kg)   SpO2 97%   BMI 35.90 kg/m , BMI Body mass index is 35.9 kg/m.  Wt Readings from Last 3 Encounters:  04/14/16 190 lb (86.2 kg)  02/12/16 189 lb (85.7 kg)  01/22/16 190 lb (86.2 kg)    Overweight woman, appears comfortable.  HEENT: Conjunctiva and lids normal, oropharynx clear.  Neck: Supple, no elevated JVP or carotid bruits, no thyromegaly.  Lungs: Course but clear to auscultation, nonlabored breathing at rest.  Cardiac: RRR, 2/6 systolic murmur at the base, no pericardial rub. Abdomen: Soft,  nontender, bowel sounds present, no guarding or rebound.  Extremities: 1+ edema below the knees, distal pulses 2+.   ECG: I personally reviewed the tracing from 02/12/2016 which showed normal sinus rhythm.  Recent Labwork: 05/21/2015: ALT 12; AST 20; TSH 1.85 01/22/2016: BUN 19; Creatinine, Ser 1.11; Hemoglobin 11.2; Platelets 288; Potassium 3.6; Sodium 133   Other Studies Reviewed Today:  Echocardiogram 04/11/2015: Study Conclusions  - Left ventricle: The cavity size was normal. Wall thickness was   increased in a pattern of mild LVH. Systolic function was normal.   The estimated ejection fraction was in the range of 60% to 65%.   Wall motion was normal; there were no regional wall motion   abnormalities. Features are consistent with a pseudonormal left   ventricular filling pattern, with concomitant abnormal relaxation   and increased filling pressure (grade 2 diastolic dysfunction). - Mitral valve: There was mild regurgitation. - Right atrium: The atrium was mildly dilated. There was the   appearance of a Chiari network. - Tricuspid valve: There was mild regurgitation. - Pulmonary arteries: PA peak pressure: 25 mm Hg (S). - Pericardium, extracardiac: There was no pericardial effusion.  Impressions:  - Mild LVH with LVEF 60-65% and grade 2 diastolic dysfunction with   increased filling pressure. Mild mitral regurgitation. Mild   tricuspid regurgitation with normal PASP 25 mmHg. Chiari network   noted in the right atrium.  Assessment and Plan:  1. Paroxysmal atrial fibrillation. Plan to continue current cardiac regimen and follow-up TSH and LFTs on amiodarone. She has maintained sinus rhythm since cardioversion with no recurrent palpitations.  2. Essential hypertension, blood pressure elevated today. She reports compliance with her medications. Keep follow-up with Dr. Luan Pulling.  Current medicines were reviewed with the patient today.   Orders Placed This Encounter    Procedures  . Hepatic function panel  . TSH    Disposition: Follow-up in 6 months.  Signed, Satira Sark, MD, [facility] 04/14/2016 11:29 AM    [facility] [place]. [address] [place], [place] [address] Phone: [phone]; Fax: [phone]

## 2016-04-14 ENCOUNTER — Other Ambulatory Visit (HOSPITAL_COMMUNITY)
Admission: RE | Admit: 2016-04-14 | Discharge: 2016-04-14 | Disposition: A | Discharge status: Home / Self Care | Payer: Medicare PPO | Source: Ambulatory Visit | Attending: Cardiology | Admitting: Cardiology

## 2016-04-14 ENCOUNTER — Encounter: Payer: Self-pay | Admitting: Cardiology

## 2016-04-14 ENCOUNTER — Ambulatory Visit (INDEPENDENT_AMBULATORY_CARE_PROVIDER_SITE_OTHER): Payer: Medicare PPO | Admitting: Cardiology

## 2016-04-14 VITALS — BP 150/78 | HR 83 | Ht 61.0 in | Wt 190.0 lb

## 2016-04-14 DIAGNOSIS — I10 Essential (primary) hypertension: Secondary | ICD-10-CM | POA: Diagnosis not present

## 2016-04-14 DIAGNOSIS — I480 Paroxysmal atrial fibrillation: Secondary | ICD-10-CM | POA: Diagnosis not present

## 2016-04-14 DIAGNOSIS — Z79899 Other long term (current) drug therapy: Secondary | ICD-10-CM | POA: Diagnosis not present

## 2016-04-14 LAB — HEPATIC FUNCTION PANEL
ALK PHOS: 96 U/L (ref 38–126)
ALT: 21 U/L (ref 14–54)
AST: 27 U/L (ref 15–41)
Albumin: 4.1 g/dL (ref 3.5–5.0)
Total Bilirubin: 0.7 mg/dL (ref 0.3–1.2)
Total Protein: 7.4 g/dL (ref 6.5–8.1)

## 2016-04-14 LAB — TSH: TSH: 5.858 u[IU]/mL — ABNORMAL HIGH (ref 0.350–4.500)

## 2016-04-14 NOTE — Patient Instructions (Signed)
Your physician wants you to follow-up in: 6 months with Dr Ferne Reus will receive a reminder letter in the mail two months in advance. If you don't receive a letter, please call our office to schedule the follow-up appointment.   Your physician recommends that you continue on your current medications as directed. Please refer to the Current Medication list given to you today.    Get lab work today: TSH, LFT's        Thank you for choosing [facility] !

## 2016-09-15 DIAGNOSIS — I482 Chronic atrial fibrillation: Secondary | ICD-10-CM | POA: Diagnosis not present

## 2016-09-15 DIAGNOSIS — I5030 Unspecified diastolic (congestive) heart failure: Secondary | ICD-10-CM | POA: Diagnosis not present

## 2016-09-15 DIAGNOSIS — J449 Chronic obstructive pulmonary disease, unspecified: Secondary | ICD-10-CM | POA: Diagnosis not present

## 2016-09-15 DIAGNOSIS — I10 Essential (primary) hypertension: Secondary | ICD-10-CM | POA: Diagnosis not present

## 2016-10-23 ENCOUNTER — Other Ambulatory Visit: Payer: Self-pay | Admitting: Physician Assistant

## 2016-10-28 NOTE — Progress Notes (Signed)
Cardiology Office Note  Date: 10/29/2016   ID: Markea, Ruzich 1945-06-24, MRN [id number]  PCP: Sinda Du, MD  Primary Cardiologist: Rozann Lesches, MD   Chief Complaint  Patient presents with  . Atrial Fibrillation    History of Present Illness: Diane Cohen is a 71 y.o. female last seen in February. She presents for a routine follow-up visit. She states that over the last few weeks her pulse has felt "woncky." She reports no chest pain, no dizziness or syncope. States that she has been taking her medications regularly.  I personally reviewed her ECG today which shows atrial fibrillation with R' in lead V1. We discussed the results. She had a successful cardioversion in November 2017.  She is due for follow-up lab work on Xarelto and amiodarone. She is not reporting any bleeding problems.  Today we discussed various treatment options for atrial fibrillation including potential for EP consultation.  Past Medical History:  Diagnosis Date  . Anxiety   . Atrial fibrillation ([place])   . Chronic diastolic heart failure ([place])   . Depression   . High cholesterol   . Hypertension   . Hypothyroidism   . Pneumonia   . TIA (transient ischemic attack)     Past Surgical History:  Procedure Laterality Date  . ABDOMINAL HYSTERECTOMY    . BREAST SURGERY     biopsy  . CARDIOVERSION N/A 01/23/2016   Procedure: CARDIOVERSION;  Surgeon: Satira Sark, MD;  Location: [facility];  Service: Cardiovascular;  Laterality: N/A;  . CHOLECYSTECTOMY    . coloscopy      Current Outpatient Prescriptions  Medication Sig Dispense Refill  . acetaminophen (TYLENOL) 500 MG tablet Take 1,000 mg by mouth every 6 (six) hours as needed.    Marland Kitchen albuterol (PROVENTIL HFA;VENTOLIN HFA) 108 (90 BASE) MCG/ACT inhaler Inhale 1-2 puffs into the lungs every 6 (six) hours as needed for wheezing or shortness of breath.    Marland Kitchen amiodarone (PACERONE) 200 MG tablet Take 200 mg by mouth daily.    Marland Kitchen  amLODipine (NORVASC) 5 MG tablet Take 5 mg by mouth daily.    . Calcium Carbonate-Vit D-Min (CALTRATE PLUS PO) Take by mouth 2 (two) times daily.    . Cyanocobalamin (B-12 COMPLIANCE INJECTION IJ) Inject as directed.    . DULoxetine (CYMBALTA) 60 MG capsule TK 1 C PO QD  6  . estradiol (ESTRACE) 1 MG tablet TAKE 1 TABLET BY MOUTH EVERY NIGHT AT BEDTIME 90 tablet 3  . levothyroxine (SYNTHROID, LEVOTHROID) 100 MCG tablet Take 100 mcg by mouth daily before breakfast.    . lisinopril (PRINIVIL,ZESTRIL) 20 MG tablet Take 1 tablet (20 mg total) by mouth daily.    . magnesium oxide (MAG-OX) 400 MG tablet Take 400 mg by mouth daily.    . metoprolol succinate (TOPROL-XL) 100 MG 24 hr tablet Take 100 mg by mouth daily. Take with or immediately following a meal.    . Multiple Vitamin (MULTIVITAMIN) tablet Take 1 tablet by mouth daily.    Marland Kitchen omeprazole (PRILOSEC) 40 MG capsule 1 capsule daily.    . potassium chloride SA (K-DUR,KLOR-CON) 20 MEQ tablet Take 2 tablets (40 mEq total) by mouth daily. 180 tablet 3  . rizatriptan (MAXALT-MLT) 10 MG disintegrating tablet Take 10 mg by mouth as needed for migraine. Reported on 04/30/2015    . torsemide (DEMADEX) 20 MG tablet TAKE 2 TABLETS(40 MG) BY MOUTH EVERY MORNING 60 tablet 6  . XARELTO 20 MG TABS tablet  TAKE 1 TABLET BY MOUTH EVERY DAY WITH DINNER 30 tablet 6   No current facility-administered medications for this visit.    Allergies:  Levaquin [levofloxacin] and Sulfa antibiotics   Social History: The patient  reports that she quit smoking about 43 years ago. Her smoking use included Cigarettes. She smoked 0.50 packs per day. She has never used smokeless tobacco. She reports that she does not drink alcohol or use drugs.   ROS:  Please see the history of present illness. Otherwise, complete review of systems is positive for none.  All other systems are reviewed and negative.   Physical Exam: VS:  BP 122/70   Pulse (!) 103   Ht 5\' 1"  (1.549 m)   Wt 188 lb  (85.3 kg)   SpO2 97%   BMI 35.52 kg/m , BMI Body mass index is 35.52 kg/m.  Wt Readings from Last 3 Encounters:  10/29/16 188 lb (85.3 kg)  04/14/16 190 lb (86.2 kg)  02/12/16 189 lb (85.7 kg)    Overweight woman, appears comfortable.  HEENT: Conjunctiva and lids normal, oropharynx clear.  Neck: Supple, no elevated JVP or carotid bruits, no thyromegaly.  Lungs: Course but clear to auscultation, nonlabored breathing at rest.  Cardiac: Irregularly irregular, 2/6 systolic murmur at the base, no pericardial rub. Abdomen: Soft, nontender, bowel sounds present, no guarding or rebound.  Extremities: 1+ edema below the knees, distal pulses 2+.  Skin: Warm and dry. [place] skeletal: No kyphosis. Neuropsychiatric: Alert and oriented 3, affect appropriate.  ECG: I personally reviewed the tracing from 02/12/2016 which showed normal sinus rhythm.  Recent Labwork: 01/22/2016: BUN 19; Creatinine, Ser 1.11; Hemoglobin 11.2; Platelets 288; Potassium 3.6; Sodium 133 04/14/2016: ALT 21; AST 27; TSH 5.858   Other Studies Reviewed Today:  Echocardiogram 04/11/2015: Study Conclusions  - Left ventricle: The cavity size was normal. Wall thickness was increased in a pattern of mild LVH. Systolic function was normal. The estimated ejection fraction was in the range of 60% to 65%. Wall motion was normal; there were no regional wall motion abnormalities. Features are consistent with a pseudonormal left ventricular filling pattern, with concomitant abnormal relaxation and increased filling pressure (grade 2 diastolic dysfunction). - Mitral valve: There was mild regurgitation. - Right atrium: The atrium was mildly dilated. There was the appearance of a Chiari network. - Tricuspid valve: There was mild regurgitation. - Pulmonary arteries: PA peak pressure: 25 mm Hg (S). - Pericardium, extracardiac: There was no pericardial effusion.  Impressions:  - Mild LVH with LVEF 60-65% and  grade 2 diastolic dysfunction with increased filling pressure. Mild mitral regurgitation. Mild tricuspid regurgitation with normal PASP 25 mmHg. Chiari network noted in the right atrium.  Assessment and Plan:  1. Persistent atrial fibrillation, recurrence possibly within the last few weeks based on symptoms. She had a successful cardioversion in November 2017 and has been maintained on amiodarone and Xarelto since then. We discussed various options today, decision is to increase amiodarone to 200 mg twice daily and bring her back within the next 2 weeks. If she does not convert with this change, elective cardioversion can be scheduled. She wanted to hold off on EP consultation for now. Check CBC, CMET and TSH as well.  2. Essential hypertension, blood pressure well controlled today.  3. History of TIA. She continues on Xarelto.  4. Hypothyroidism, on Synthroid. Follow-up TSH as she is also on amiodarone.  Current medicines were reviewed with the patient today.   Orders Placed This Encounter  Procedures  .  EKG 12-Lead    Disposition: Follow-up in 2 weeks.  Signed, Satira Sark, MD, [facility] 10/29/2016 4:14 PM    [facility] at [facility] [address], [place], [place] [address] Phone: [phone]; Fax: [phone]

## 2016-10-29 ENCOUNTER — Ambulatory Visit (INDEPENDENT_AMBULATORY_CARE_PROVIDER_SITE_OTHER): Payer: Medicare PPO | Admitting: Cardiology

## 2016-10-29 ENCOUNTER — Encounter: Payer: Self-pay | Admitting: Cardiology

## 2016-10-29 VITALS — BP 122/70 | HR 103 | Ht 61.0 in | Wt 188.0 lb

## 2016-10-29 DIAGNOSIS — I481 Persistent atrial fibrillation: Secondary | ICD-10-CM | POA: Diagnosis not present

## 2016-10-29 DIAGNOSIS — E039 Hypothyroidism, unspecified: Secondary | ICD-10-CM

## 2016-10-29 DIAGNOSIS — I480 Paroxysmal atrial fibrillation: Secondary | ICD-10-CM

## 2016-10-29 DIAGNOSIS — I4819 Other persistent atrial fibrillation: Secondary | ICD-10-CM

## 2016-10-29 DIAGNOSIS — I10 Essential (primary) hypertension: Secondary | ICD-10-CM | POA: Diagnosis not present

## 2016-10-29 DIAGNOSIS — Z8673 Personal history of transient ischemic attack (TIA), and cerebral infarction without residual deficits: Secondary | ICD-10-CM

## 2016-10-29 MED ORDER — AMIODARONE HCL 200 MG PO TABS: 200.0000 mg | ORAL_TABLET | Freq: Two times a day (BID) | ORAL | 3 refills | Status: DC

## 2016-10-29 NOTE — Patient Instructions (Signed)
Medication Instructions:  INCREASE AMIODARONE TO 200 mg two times daily   Labwork: bmet cmet tsh  Testing/Procedures: none  Follow-Up: Your physician recommends that you schedule a follow-up appointment in: 2 weeks    Any Other Special Instructions Will Be Listed Below (If Applicable).     If you need a refill on your cardiac medications before your next appointment, please call your pharmacy.

## 2016-10-30 DIAGNOSIS — I482 Chronic atrial fibrillation: Secondary | ICD-10-CM | POA: Diagnosis not present

## 2016-10-30 DIAGNOSIS — I10 Essential (primary) hypertension: Secondary | ICD-10-CM | POA: Diagnosis not present

## 2016-10-30 DIAGNOSIS — J449 Chronic obstructive pulmonary disease, unspecified: Secondary | ICD-10-CM | POA: Diagnosis not present

## 2016-10-30 DIAGNOSIS — I5030 Unspecified diastolic (congestive) heart failure: Secondary | ICD-10-CM | POA: Diagnosis not present

## 2016-11-05 NOTE — Progress Notes (Signed)
Cardiology Office Note  Date: 11/06/2016   ID: Sherrill, Buikema June 26, 1945, MRN [id number]  PCP: Sinda Du, MD  Primary Cardiologist: Rozann Lesches, MD   Chief Complaint  Patient presents with  . Atrial Fibrillation    History of Present Illness: Diane Cohen is a 71 y.o. female seen recently on August 23 with persistent atrial fibrillation. Last successful cardioversion was in November 2017. At the last visit we increased amiodarone to 200 mg twice daily and continued on Xarelto with planned follow-up. She presents today reporting no change in symptoms and remains in atrial fibrillation on examination. She states she has been compliant with her medications.  She had interval lab work obtained through Dr. Luan Pulling office, we are requesting the results for review.  Today we discussed scheduling an elective cardioversion for next week in an attempt to restore sinus rhythm. She is in agreement to proceed.  Past Medical History:  Diagnosis Date  . Anxiety   . Atrial fibrillation ([place])   . Chronic diastolic heart failure ([place])   . Depression   . High cholesterol   . Hypertension   . Hypothyroidism   . Pneumonia   . TIA (transient ischemic attack)     Past Surgical History:  Procedure Laterality Date  . ABDOMINAL HYSTERECTOMY    . BREAST SURGERY     biopsy  . CARDIOVERSION N/A 01/23/2016   Procedure: CARDIOVERSION;  Surgeon: Satira Sark, MD;  [facility];  Service: Cardiovascular;  Laterality: N/A;  . CHOLECYSTECTOMY    . coloscopy      Current Outpatient Prescriptions  Medication Sig Dispense Refill  . acetaminophen (TYLENOL) 500 MG tablet Take 1,000 mg by mouth every 6 (six) hours as needed.    Marland Kitchen albuterol (PROVENTIL HFA;VENTOLIN HFA) 108 (90 BASE) MCG/ACT inhaler Inhale 1-2 puffs into the lungs every 6 (six) hours as needed for wheezing or shortness of breath.    Marland Kitchen amLODipine (NORVASC) 5 MG tablet Take 5 mg by mouth daily.    . Calcium  Carbonate-Vit D-Min (CALTRATE PLUS PO) Take by mouth 2 (two) times daily.    . Cyanocobalamin (B-12 COMPLIANCE INJECTION IJ) Inject as directed.    . DULoxetine (CYMBALTA) 60 MG capsule TK 1 C PO QD  6  . estradiol (ESTRACE) 1 MG tablet TAKE 1 TABLET BY MOUTH EVERY NIGHT AT BEDTIME 90 tablet 3  . ferrous sulfate 325 (65 FE) MG tablet Take 325 mg by mouth daily with breakfast.    . levothyroxine (SYNTHROID, LEVOTHROID) 137 MCG tablet Take 137 mcg by mouth daily before breakfast.     . lisinopril (PRINIVIL,ZESTRIL) 20 MG tablet Take 1 tablet (20 mg total) by mouth daily.    . magnesium oxide (MAG-OX) 400 MG tablet Take 400 mg by mouth daily.    . metoprolol succinate (TOPROL-XL) 100 MG 24 hr tablet Take 100 mg by mouth daily. Take with or immediately following a meal.    . Multiple Vitamin (MULTIVITAMIN) tablet Take 1 tablet by mouth daily.    Marland Kitchen omeprazole (PRILOSEC) 40 MG capsule 1 capsule daily.    . potassium chloride SA (K-DUR,KLOR-CON) 20 MEQ tablet Take 2 tablets (40 mEq total) by mouth daily. 180 tablet 3  . rizatriptan (MAXALT-MLT) 10 MG disintegrating tablet Take 10 mg by mouth as needed for migraine. Reported on 04/30/2015    . torsemide (DEMADEX) 20 MG tablet TAKE 2 TABLETS(40 MG) BY MOUTH EVERY MORNING 60 tablet 6  . XARELTO 20 MG  TABS tablet TAKE 1 TABLET BY MOUTH EVERY DAY WITH DINNER 30 tablet 6  . amiodarone (PACERONE) 200 MG tablet Take 1 tablet (200 mg total) by mouth daily. 90 tablet 3   No current facility-administered medications for this visit.    Allergies:  Levaquin [levofloxacin] and Sulfa antibiotics   Social History: The patient  reports that she quit smoking about 43 years ago. Her smoking use included Cigarettes. She smoked 0.50 packs per day. She has never used smokeless tobacco. She reports that she does not drink alcohol or use drugs.   ROS:  Please see the history of present illness. Otherwise, complete review of systems is positive for none.  All other systems  are reviewed and negative.   Physical Exam: VS:  BP 110/76   Pulse (!) 110   Wt 181 lb (82.1 kg)   SpO2 96%   BMI 34.20 kg/m , BMI Body mass index is 34.2 kg/m.  Wt Readings from Last 3 Encounters:  11/06/16 181 lb (82.1 kg)  10/29/16 188 lb (85.3 kg)  04/14/16 190 lb (86.2 kg)    General: Overweight woman, appears comfortable at rest. HEENT: Conjunctiva and lids normal, oropharynx clear. Neck: Supple, no elevated JVP or carotid bruits, no thyromegaly. Lungs: No wheezing, nonlabored breathing at rest. Cardiac: Irregularly irregular, 2/6 significant systolic murmur, no pericardial rub. Abdomen: Soft, nontender, bowel sounds present, no guarding or rebound. Extremities: 1+ leg edema, distal pulses 2+. Skin: Warm and dry. Musculoskeletal: No kyphosis. Neuropsychiatric: Alert and oriented x3, affect grossly appropriate.  ECG: I personally reviewed the tracing from 10/29/2016 which showed atrial fibrillation.  Recent Labwork: 01/22/2016: BUN 19; Creatinine, Ser 1.11; Hemoglobin 11.2; Platelets 288; Potassium 3.6; Sodium 133 04/14/2016: ALT 21; AST 27; TSH 5.858   Other Studies Reviewed Today:  Echocardiogram 04/11/2015: Study Conclusions  - Left ventricle: The cavity size was normal. Wall thickness was   increased in a pattern of mild LVH. Systolic function was normal.   The estimated ejection fraction was in the range of 60% to 65%.   Wall motion was normal; there were no regional wall motion   abnormalities. Features are consistent with a pseudonormal left   ventricular filling pattern, with concomitant abnormal relaxation   and increased filling pressure (grade 2 diastolic dysfunction). - Mitral valve: There was mild regurgitation. - Right atrium: The atrium was mildly dilated. There was the   appearance of a Chiari network. - Tricuspid valve: There was mild regurgitation. - Pulmonary arteries: PA peak pressure: 25 mm Hg (S). - Pericardium, extracardiac: There was no  pericardial effusion.  Impressions:  - Mild LVH with LVEF 60-65% and grade 2 diastolic dysfunction with   increased filling pressure. Mild mitral regurgitation. Mild   tricuspid regurgitation with normal PASP 25 mmHg. Chiari network   noted in the right atrium.  Assessment and Plan:  1. Persistent atrial fibrillation. Underwent successful cardioversion in November 2017. She did not convert to sinus rhythm following recent increase in amiodarone dose and we will plan to schedule an elective electrical cardioversion for next week. Obtain recent lab work from Dr. Luan Pulling. Go ahead and reduce amiodarone back to 200 mg once daily in anticipation of cardioversion. Continue Xarelto for stroke prophylaxis.  2. Essential hypertension, blood pressure is well controlled today. No changes were made in antihypertensive regimen.  3. Hypothyroidism, patient states Synthroid dose was just recently increased by Dr. Luan Pulling. Requesting follow-up lab work including TSH.  4. Previous history of TIA, asymptomatic.  Current medicines were  reviewed with the patient today.  Disposition: Follow-up in one month.  Signed, Satira Sark, MD, [facility] 11/06/2016 2:06 PM    [facility] [address] [place], [place] [address] Phone: [phone]; Fax: [phone]

## 2016-11-06 ENCOUNTER — Encounter: Payer: Self-pay | Admitting: *Deleted

## 2016-11-06 ENCOUNTER — Other Ambulatory Visit: Payer: Self-pay | Admitting: Cardiology

## 2016-11-06 ENCOUNTER — Ambulatory Visit (INDEPENDENT_AMBULATORY_CARE_PROVIDER_SITE_OTHER): Payer: Medicare PPO | Admitting: Cardiology

## 2016-11-06 ENCOUNTER — Other Ambulatory Visit (HOSPITAL_COMMUNITY)
Admission: RE | Admit: 2016-11-06 | Discharge: 2016-11-06 | Disposition: A | Discharge status: Home / Self Care | Payer: Medicare PPO | Source: Ambulatory Visit | Attending: Cardiology | Admitting: Cardiology

## 2016-11-06 VITALS — BP 110/76 | HR 110 | Wt 181.0 lb

## 2016-11-06 DIAGNOSIS — I481 Persistent atrial fibrillation: Secondary | ICD-10-CM | POA: Diagnosis not present

## 2016-11-06 DIAGNOSIS — I4819 Other persistent atrial fibrillation: Secondary | ICD-10-CM

## 2016-11-06 DIAGNOSIS — E039 Hypothyroidism, unspecified: Secondary | ICD-10-CM | POA: Insufficient documentation

## 2016-11-06 DIAGNOSIS — I10 Essential (primary) hypertension: Secondary | ICD-10-CM | POA: Diagnosis not present

## 2016-11-06 DIAGNOSIS — Z8673 Personal history of transient ischemic attack (TIA), and cerebral infarction without residual deficits: Secondary | ICD-10-CM

## 2016-11-06 LAB — COMPREHENSIVE METABOLIC PANEL
ALK PHOS: 122 U/L (ref 38–126)
ALT: 16 U/L (ref 14–54)
AST: 22 U/L (ref 15–41)
Albumin: 4.2 g/dL (ref 3.5–5.0)
Anion gap: 9 (ref 5–15)
BUN: 15 mg/dL (ref 6–20)
CALCIUM: 9.9 mg/dL (ref 8.9–10.3)
CO2: 27 mmol/L (ref 22–32)
CREATININE: 1.14 mg/dL — AB (ref 0.44–1.00)
Chloride: 101 mmol/L (ref 101–111)
GFR calc Af Amer: 55 mL/min — ABNORMAL LOW (ref >60)
GFR, EST NON AFRICAN AMERICAN: 47 mL/min — AB (ref >60)
Glucose, Bld: 95 mg/dL (ref 65–99)
Potassium: 4.1 mmol/L (ref 3.5–5.1)
Sodium: 137 mmol/L (ref 135–145)
Total Bilirubin: 0.8 mg/dL (ref 0.3–1.2)
Total Protein: 8.3 g/dL — ABNORMAL HIGH (ref 6.5–8.1)

## 2016-11-06 MED ORDER — AMIODARONE HCL 200 MG PO TABS
200.0000 mg | ORAL_TABLET | Freq: Every day | ORAL | 3 refills | Status: DC
Start: 2016-11-06 — End: 2016-12-18

## 2016-11-06 NOTE — Addendum Note (Signed)
Addended by: Barbarann Ehlers A on: 11/06/2016 02:21 PM   Modules accepted: Orders

## 2016-11-06 NOTE — Patient Instructions (Signed)
Diane Cohen  11/06/2016     @PREFPERIOPPHARMACY @   Your procedure is scheduled on 11/13/2016.  Report to [facility] at 10:15 A.M.  Call this number if you have problems the morning of surgery:  [phone]   Remember:  Do not eat food or drink liquids after midnight.  Take these medicines the morning of surgery with A SIP OF WATER :    Do not wear jewelry, make-up or nail polish.  Do not wear lotions, powders, or perfumes, or deoderant.  Do not shave 48 hours prior to surgery.  Men may shave face and neck.  Do not bring valuables to the hospital.  [facility] is not responsible for any belongings or valuables.  Contacts, dentures or bridgework may not be worn into surgery.  Leave your suitcase in the car.  After surgery it may be brought to your room.  For patients admitted to the hospital, discharge time will be determined by your treatment team.  Patients discharged the day of surgery will not be allowed to drive home.   Name and phone number of your driver:   family Special instructions:  n/a  Please read over the following fact sheets that you were given. Care and Recovery After Surgery    Electrical Cardioversion Electrical cardioversion is the delivery of a jolt of electricity to restore a normal rhythm to the heart. A rhythm that is too fast or is not regular keeps the heart from pumping well. In this procedure, sticky patches or metal paddles are placed on the chest to deliver electricity to the heart from a device. This procedure may be done in an emergency if:  There is low or no blood pressure as a result of the heart rhythm.  Normal rhythm must be restored as fast as possible to protect the brain and heart from further damage.  It may save a life.  This procedure may also be done for irregular or fast heart rhythms that are not immediately life-threatening. Tell a health care provider about:  Any allergies you have.  All medicines you are taking,  including vitamins, herbs, eye drops, creams, and over-the-counter medicines.  Any problems you or family members have had with anesthetic medicines.  Any blood disorders you have.  Any surgeries you have had.  Any medical conditions you have.  Whether you are pregnant or may be pregnant. What are the risks? Generally, this is a safe procedure. However, problems may occur, including:  Allergic reactions to medicines.  A blood clot that breaks free and travels to other parts of your body.  The possible return of an abnormal heart rhythm within hours or days after the procedure.  Your heart stopping (cardiac arrest). This is rare.  What happens before the procedure? Medicines  Your health care provider may have you start taking: ? Blood-thinning medicines (anticoagulants) so your blood does not clot as easily. ? Medicines may be given to help stabilize your heart rate and rhythm.  Ask your health care provider about changing or stopping your regular medicines. This is especially important if you are taking diabetes medicines or blood thinners. General instructions  Plan to have someone take you home from the hospital or clinic.  If you will be going home right after the procedure, plan to have someone with you for 24 hours.  Follow instructions from your health care provider about eating or drinking restrictions. What happens during the procedure?  To lower your risk of infection: ? Your  health care team will wash or sanitize their hands. ? Your skin will be washed with soap.  An IV tube will be inserted into one of your veins.  You will be given a medicine to help you relax (sedative).  Sticky patches (electrodes) or metal paddles may be placed on your chest.  An electrical shock will be delivered. The procedure may vary among health care providers and hospitals. What happens after the procedure?  Your blood pressure, heart rate, breathing rate, and blood oxygen  level will be monitored until the medicines you were given have worn off.  Do not drive for 24 hours if you were given a sedative.  Your heart rhythm will be watched to make sure it does not change. This information is not intended to replace advice given to you by your health care provider. Make sure you discuss any questions you have with your health care provider. Document Released: 02/13/2002 Document Revised: 10/23/2015 Document Reviewed: 08/30/2015 Elsevier Interactive Patient Education  2017 [employer].

## 2016-11-06 NOTE — Patient Instructions (Signed)
Your physician recommends that you schedule a follow-up appointment in:  1 months with Dr Domenic Polite     DECREASE Amiodarone to 1 tablet-  200 mg daily     Your physician has recommended that you have a Cardioversion (DCCV). Electrical Cardioversion uses a jolt of electricity to your heart either through paddles or wired patches attached to your chest. This is a controlled, usually prescheduled, procedure. Defibrillation is done under light anesthesia in the hospital, and you usually go home the day of the procedure. This is done to get your heart back into a normal rhythm. You are not awake for the procedure. Please see the instruction sheet given to you today.       Thank you for choosing [facility] !

## 2016-11-10 ENCOUNTER — Encounter (HOSPITAL_COMMUNITY): Payer: Self-pay

## 2016-11-10 ENCOUNTER — Encounter (HOSPITAL_COMMUNITY)
Admission: RE | Admit: 2016-11-10 | Discharge: 2016-11-10 | Disposition: A | Discharge status: Home / Self Care | Payer: Medicare PPO | Source: Ambulatory Visit | Attending: Cardiology | Admitting: Cardiology

## 2016-11-13 ENCOUNTER — Ambulatory Visit (HOSPITAL_COMMUNITY)
Admission: RE | Admit: 2016-11-13 | Discharge: 2016-11-13 | Disposition: A | Discharge status: Home / Self Care | Payer: Medicare PPO | Source: Ambulatory Visit | Attending: Cardiology | Admitting: Cardiology

## 2016-11-13 ENCOUNTER — Encounter (HOSPITAL_COMMUNITY): Payer: Self-pay | Admitting: *Deleted

## 2016-11-13 ENCOUNTER — Ambulatory Visit (HOSPITAL_COMMUNITY): Payer: Medicare PPO | Admitting: Anesthesiology

## 2016-11-13 ENCOUNTER — Encounter (HOSPITAL_COMMUNITY)
Admission: RE | Disposition: A | Discharge status: Home / Self Care | Payer: Self-pay | Source: Ambulatory Visit | Attending: Cardiology

## 2016-11-13 ENCOUNTER — Other Ambulatory Visit: Payer: Self-pay

## 2016-11-13 DIAGNOSIS — Z8673 Personal history of transient ischemic attack (TIA), and cerebral infarction without residual deficits: Secondary | ICD-10-CM | POA: Insufficient documentation

## 2016-11-13 DIAGNOSIS — I509 Heart failure, unspecified: Secondary | ICD-10-CM | POA: Diagnosis not present

## 2016-11-13 DIAGNOSIS — Z791 Long term (current) use of non-steroidal anti-inflammatories (NSAID): Secondary | ICD-10-CM | POA: Insufficient documentation

## 2016-11-13 DIAGNOSIS — Z79899 Other long term (current) drug therapy: Secondary | ICD-10-CM | POA: Insufficient documentation

## 2016-11-13 DIAGNOSIS — I5032 Chronic diastolic (congestive) heart failure: Secondary | ICD-10-CM | POA: Insufficient documentation

## 2016-11-13 DIAGNOSIS — E7800 Pure hypercholesterolemia, unspecified: Secondary | ICD-10-CM | POA: Insufficient documentation

## 2016-11-13 DIAGNOSIS — F419 Anxiety disorder, unspecified: Secondary | ICD-10-CM | POA: Diagnosis not present

## 2016-11-13 DIAGNOSIS — F329 Major depressive disorder, single episode, unspecified: Secondary | ICD-10-CM | POA: Diagnosis not present

## 2016-11-13 DIAGNOSIS — I110 Hypertensive heart disease with heart failure: Secondary | ICD-10-CM | POA: Diagnosis not present

## 2016-11-13 DIAGNOSIS — Z7901 Long term (current) use of anticoagulants: Secondary | ICD-10-CM | POA: Diagnosis not present

## 2016-11-13 DIAGNOSIS — E039 Hypothyroidism, unspecified: Secondary | ICD-10-CM | POA: Diagnosis not present

## 2016-11-13 DIAGNOSIS — I4891 Unspecified atrial fibrillation: Secondary | ICD-10-CM | POA: Diagnosis not present

## 2016-11-13 DIAGNOSIS — Z87891 Personal history of nicotine dependence: Secondary | ICD-10-CM | POA: Insufficient documentation

## 2016-11-13 HISTORY — PX: CARDIOVERSION: SHX1299

## 2016-11-13 SURGERY — CARDIOVERSION
Anesthesia: Monitor Anesthesia Care

## 2016-11-13 MED ORDER — LACTATED RINGERS IV SOLN
INTRAVENOUS | Status: DC | PRN
  Administered 2016-11-13: 11:00:00 via INTRAVENOUS

## 2016-11-13 MED ORDER — PROPOFOL 500 MG/50ML IV EMUL
INTRAVENOUS | Status: DC | PRN
  Administered 2016-11-13: 150 ug/kg/min via INTRAVENOUS

## 2016-11-13 NOTE — H&P (Signed)
For full medical history please refer to referenced recent clinic note from Dr Domenic Polite posted below. Patient presents for electrical cardioversion for afib. She has been on xarelto and amiodarone, prior succesul cardioversion.    Carlyle Dolly MD   Cardiology Office Note  Date: 11/06/2016   ID: Diane Cohen, Diane Cohen 1945-10-27, MRN [id number]  PCP: Sinda Du, MD    Primary Cardiologist: Rozann Lesches, MD      Chief Complaint  Patient presents with  . Atrial Fibrillation    History of Present Illness: Diane Cohen is a 71 y.o. female seen recently on August 23 with persistent atrial fibrillation. Last successful cardioversion was in November 2017. At the last visit we increased amiodarone to 200 mg twice daily and continued on Xarelto with planned follow-up. She presents today reporting no change in symptoms and remains in atrial fibrillation on examination. She states she has been compliant with her medications.  She had interval lab work obtained through Dr. Luan Pulling office, we are requesting the results for review.  Today we discussed scheduling an elective cardioversion for next week in an attempt to restore sinus rhythm. She is in agreement to proceed.      Past Medical History:  Diagnosis Date  . Anxiety   . Atrial fibrillation ([place])   . Chronic diastolic heart failure ([place])   . Depression   . High cholesterol   . Hypertension   . Hypothyroidism   . Pneumonia   . TIA (transient ischemic attack)          Past Surgical History:  Procedure Laterality Date  . ABDOMINAL HYSTERECTOMY    . BREAST SURGERY     biopsy  . CARDIOVERSION N/A 01/23/2016   Procedure: CARDIOVERSION;  Surgeon: Satira Sark, MD;  [facility];  Service: Cardiovascular;  Laterality: N/A;  . CHOLECYSTECTOMY    . coloscopy            Current Outpatient Prescriptions  Medication Sig Dispense Refill  . acetaminophen (TYLENOL) 500 MG tablet  Take 1,000 mg by mouth every 6 (six) hours as needed.    Marland Kitchen albuterol (PROVENTIL HFA;VENTOLIN HFA) 108 (90 BASE) MCG/ACT inhaler Inhale 1-2 puffs into the lungs every 6 (six) hours as needed for wheezing or shortness of breath.    Marland Kitchen amLODipine (NORVASC) 5 MG tablet Take 5 mg by mouth daily.    . Calcium Carbonate-Vit D-Min (CALTRATE PLUS PO) Take by mouth 2 (two) times daily.    . Cyanocobalamin (B-12 COMPLIANCE INJECTION IJ) Inject as directed.    . DULoxetine (CYMBALTA) 60 MG capsule TK 1 C PO QD  6  . estradiol (ESTRACE) 1 MG tablet TAKE 1 TABLET BY MOUTH EVERY NIGHT AT BEDTIME 90 tablet 3  . ferrous sulfate 325 (65 FE) MG tablet Take 325 mg by mouth daily with breakfast.    . levothyroxine (SYNTHROID, LEVOTHROID) 137 MCG tablet Take 137 mcg by mouth daily before breakfast.     . lisinopril (PRINIVIL,ZESTRIL) 20 MG tablet Take 1 tablet (20 mg total) by mouth daily.    . magnesium oxide (MAG-OX) 400 MG tablet Take 400 mg by mouth daily.    . metoprolol succinate (TOPROL-XL) 100 MG 24 hr tablet Take 100 mg by mouth daily. Take with or immediately following a meal.    . Multiple Vitamin (MULTIVITAMIN) tablet Take 1 tablet by mouth daily.    Marland Kitchen omeprazole (PRILOSEC) 40 MG capsule 1 capsule daily.    . potassium chloride SA (K-DUR,KLOR-CON) 20  MEQ tablet Take 2 tablets (40 mEq total) by mouth daily. 180 tablet 3  . rizatriptan (MAXALT-MLT) 10 MG disintegrating tablet Take 10 mg by mouth as needed for migraine. Reported on 04/30/2015    . torsemide (DEMADEX) 20 MG tablet TAKE 2 TABLETS(40 MG) BY MOUTH EVERY MORNING 60 tablet 6  . XARELTO 20 MG TABS tablet TAKE 1 TABLET BY MOUTH EVERY DAY WITH DINNER 30 tablet 6  . amiodarone (PACERONE) 200 MG tablet Take 1 tablet (200 mg total) by mouth daily. 90 tablet 3   No current facility-administered medications for this visit.    Allergies:  Levaquin [levofloxacin] and Sulfa antibiotics   Social History: The patient  reports  that she quit smoking about 43 years ago. Her smoking use included Cigarettes. She smoked 0.50 packs per day. She has never used smokeless tobacco. She reports that she does not drink alcohol or use drugs.   ROS:  Please see the history of present illness. Otherwise, complete review of systems is positive for none.  All other systems are reviewed and negative.   Physical Exam: VS:  BP 110/76   Pulse (!) 110   Wt 181 lb (82.1 kg)   SpO2 96%   BMI 34.20 kg/m , BMI Body mass index is 34.2 kg/m.     Wt Readings from Last 3 Encounters:  11/06/16 181 lb (82.1 kg)  10/29/16 188 lb (85.3 kg)  04/14/16 190 lb (86.2 kg)    General: Overweight woman, appears comfortable at rest. HEENT: Conjunctiva and lids normal, oropharynx clear. Neck: Supple, no elevated JVP or carotid bruits, no thyromegaly. Lungs: No wheezing, nonlabored breathing at rest. Cardiac: Irregularly irregular, 2/6 significant systolic murmur, no pericardial rub. Abdomen: Soft, nontender, bowel sounds present, no guarding or rebound. Extremities: 1+ leg edema, distal pulses 2+. Skin: Warm and dry. Musculoskeletal: No kyphosis. Neuropsychiatric: Alert and oriented x3, affect grossly appropriate.  ECG: I personally reviewed the tracing from 10/29/2016 which showed atrial fibrillation.  Recent Labwork: 01/22/2016: BUN 19; Creatinine, Ser 1.11; Hemoglobin 11.2; Platelets 288; Potassium 3.6; Sodium 133 04/14/2016: ALT 21; AST 27; TSH 5.858   Other Studies Reviewed Today:  Echocardiogram 04/11/2015: Study Conclusions  - Left ventricle: The cavity size was normal. Wall thickness was increased in a pattern of mild LVH. Systolic function was normal. The estimated ejection fraction was in the range of 60% to 65%. Wall motion was normal; there were no regional wall motion abnormalities. Features are consistent with a pseudonormal left ventricular filling pattern, with concomitant abnormal relaxation and  increased filling pressure (grade 2 diastolic dysfunction). - Mitral valve: There was mild regurgitation. - Right atrium: The atrium was mildly dilated. There was the appearance of a Chiari network. - Tricuspid valve: There was mild regurgitation. - Pulmonary arteries: PA peak pressure: 25 mm Hg (S). - Pericardium, extracardiac: There was no pericardial effusion.  Impressions:  - Mild LVH with LVEF 60-65% and grade 2 diastolic dysfunction with increased filling pressure. Mild mitral regurgitation. Mild tricuspid regurgitation with normal PASP 25 mmHg. Chiari network noted in the right atrium.  Assessment and Plan:  1. Persistent atrial fibrillation. Underwent successful cardioversion in November 2017. She did not convert to sinus rhythm following recent increase in amiodarone dose and we will plan to schedule an elective electrical cardioversion for next week. Obtain recent lab work from Dr. Luan Pulling. Go ahead and reduce amiodarone back to 200 mg once daily in anticipation of cardioversion. Continue Xarelto for stroke prophylaxis.  2. Essential hypertension, blood pressure  is well controlled today. No changes were made in antihypertensive regimen.  3. Hypothyroidism, patient states Synthroid dose was just recently increased by Dr. Luan Pulling. Requesting follow-up lab work including TSH.  4. Previous history of TIA, asymptomatic.  Current medicines were reviewed with the patient today.  Disposition: Follow-up in one month.  Signed, Satira Sark, MD, [facility]

## 2016-11-13 NOTE — Anesthesia Postprocedure Evaluation (Signed)
Anesthesia Post Note  Patient: Diane Cohen  Procedure(s) Performed: Procedure(s) (LRB): CARDIOVERSION (N/A)  Patient location during evaluation: PACU Anesthesia Type: MAC Level of consciousness: awake and alert and oriented Pain management: pain level controlled Vital Signs Assessment: post-procedure vital signs reviewed and stable Respiratory status: spontaneous breathing and respiratory function stable Cardiovascular status: stable Postop Assessment: no signs of nausea or vomiting Anesthetic complications: no     Last Vitals:  Vitals:   11/13/16 1024 11/13/16 1125  BP: 134/68   Pulse: (!) 123   Resp: 20   Temp: 37.1 C 36.8 C  SpO2: 97%     Last Pain:  Vitals:   11/13/16 1024  TempSrc: Oral                 ADAMS, AMY A

## 2016-11-13 NOTE — Progress Notes (Signed)
Electrical Cardioversion Procedure Note Diane Cohen [id number] 1945-03-21  Procedure: Electrical Cardioversion Indications:  Atrial Fibrillation  Procedure Details Consent: Risks of procedure as well as the alternatives and risks of each were explained to the (patient/caregiver).  Consent for procedure obtained. Time Out: Verified patient identification, verified procedure, site/side was marked, verified correct patient position, special equipment/implants available, medications/allergies/relevent history reviewed, required imaging and test results available.  Performed  Patient placed on cardiac monitor, pulse oximetry, supplemental oxygen as necessary.  Sedation given: propofol administered by A. Adams, CRNA Pacer pads placed anterior and posterior chest.  Cardioverted 1 time(Cohen).  Cardioverted at [place]. Biphasic, successful  Evaluation Findings: Post procedure EKG shows: NSR Complications: None Patient did tolerate procedure well.   Diane Cohen 11/13/2016, 11:50 AM

## 2016-11-13 NOTE — Anesthesia Preprocedure Evaluation (Signed)
Anesthesia Evaluation  Patient identified by MRN, date of birth, ID band Patient awake    Reviewed: Allergy & Precautions, NPO status , Patient's Chart, lab work & pertinent test results  Airway Mallampati: II  TM Distance: >3 FB     Dental  (+) Teeth Intact, Implants   Pulmonary former smoker,    breath sounds clear to auscultation       Cardiovascular hypertension, Pt. on medications +CHF  + dysrhythmias Atrial Fibrillation  Rhythm:Irregular Rate:Normal     Neuro/Psych PSYCHIATRIC DISORDERS Anxiety Depression TIA   GI/Hepatic GERD  Medicated,  Endo/Other  Hypothyroidism   Renal/GU      Musculoskeletal   Abdominal   Peds  Hematology   Anesthesia Other Findings   Reproductive/Obstetrics                             Anesthesia Physical Anesthesia Plan  ASA: III  Anesthesia Plan: MAC   Post-op Pain Management:    Induction: Intravenous  PONV Risk Score and Plan:   Airway Management Planned: Simple Face Mask  Additional Equipment:   Intra-op Plan:   Post-operative Plan:   Informed Consent: I have reviewed the patients History and Physical, chart, labs and discussed the procedure including the risks, benefits and alternatives for the proposed anesthesia with the patient or authorized representative who has indicated his/her understanding and acceptance.     Plan Discussed with:   Anesthesia Plan Comments:         Anesthesia Quick Evaluation

## 2016-11-13 NOTE — Anesthesia Procedure Notes (Signed)
Procedure Name: MAC Date/Time: 11/13/2016 11:04 AM Performed by: Andree Elk, AMY A Pre-anesthesia Checklist: Patient identified, Emergency Drugs available, Suction available, Patient being monitored and Timeout performed Oxygen Delivery Method: Simple face mask

## 2016-11-13 NOTE — CV Procedure (Signed)
Procedure: electrical cardioversion Physician: Dr Carlyle Dolly MD Indication: afib  Patient was brought to the procedure suite after appropriate consent was obtained. Sedation was achieved with the assistance of anethesiology, for more details please see there note. Defib pads were placed in the anterior and posterior positions. She was succesfully cardioverted with a single 200j synchronized shock to normal sinus rhythm. Cardiopulmonary monitoring was performed throughout the procedure, she tolerated well without complications.   Carlyle Dolly MD

## 2016-11-13 NOTE — Discharge Instructions (Signed)
Electrical Cardioversion, Care After °This sheet gives you information about how to care for yourself after your procedure. Your health care provider may also give you more specific instructions. If you have problems or questions, contact your health care provider. °What can I expect after the procedure? °After the procedure, it is common to have: °· Some redness on the skin where the shocks were given. ° °Follow these instructions at home: °· Do not drive for 24 hours if you were given a medicine to help you relax (sedative). °· Take over-the-counter and prescription medicines only as told by your health care provider. °· Ask your health care provider how to check your pulse. Check it often. °· Rest for 48 hours after the procedure or as told by your health care provider. °· Avoid or limit your caffeine use as told by your health care provider. °Contact a health care provider if: °· You feel like your heart is beating too quickly or your pulse is not regular. °· You have a serious muscle cramp that does not go away. °Get help right away if: °· You have discomfort in your chest. °· You are dizzy or you feel faint. °· You have trouble breathing or you are short of breath. °· Your speech is slurred. °· You have trouble moving an arm or leg on one side of your body. °· Your fingers or toes turn cold or blue. °This information is not intended to replace advice given to you by your health care provider. Make sure you discuss any questions you have with your health care provider. °Document Released: 12/14/2012 Document Revised: 09/27/2015 Document Reviewed: 08/30/2015 °Elsevier Interactive Patient Education © 2018 [employer] Inc[employer] ° °

## 2016-11-13 NOTE — Transfer of Care (Signed)
Immediate Anesthesia Transfer of Care Note  Patient: Diane Cohen  Procedure(s) Performed: Procedure(s): CARDIOVERSION (N/A)  Patient Location: PACU  Anesthesia Type:MAC  Level of Consciousness: awake, alert , oriented and patient cooperative  Airway & Oxygen Therapy: Patient Spontanous Breathing  Post-op Assessment: Report given to RN and Post -op Vital signs reviewed and stable  Post vital signs: Reviewed and stable.  Last Vitals:  Vitals:   11/13/16 1024 11/13/16 1125  BP: 134/68   Pulse: (!) 123   Resp: 20   Temp: 37.1 C 36.8 C  SpO2: 97%     Last Pain:  Vitals:   11/13/16 1024  TempSrc: Oral      Patients Stated Pain Goal: 8 (02/54/27 0623)  Complications: No apparent anesthesia complications

## 2016-11-17 ENCOUNTER — Encounter (HOSPITAL_COMMUNITY): Payer: Self-pay | Admitting: Cardiology

## 2016-12-09 ENCOUNTER — Ambulatory Visit: Payer: Self-pay | Admitting: Cardiology

## 2016-12-18 ENCOUNTER — Encounter: Payer: Self-pay | Admitting: Cardiology

## 2016-12-18 ENCOUNTER — Ambulatory Visit (INDEPENDENT_AMBULATORY_CARE_PROVIDER_SITE_OTHER): Payer: Medicare PPO | Admitting: Cardiology

## 2016-12-18 VITALS — BP 122/78 | HR 104 | Ht 61.0 in | Wt 182.0 lb

## 2016-12-18 DIAGNOSIS — E039 Hypothyroidism, unspecified: Secondary | ICD-10-CM

## 2016-12-18 DIAGNOSIS — I481 Persistent atrial fibrillation: Secondary | ICD-10-CM | POA: Diagnosis not present

## 2016-12-18 DIAGNOSIS — I10 Essential (primary) hypertension: Secondary | ICD-10-CM | POA: Diagnosis not present

## 2016-12-18 DIAGNOSIS — I4819 Other persistent atrial fibrillation: Secondary | ICD-10-CM

## 2016-12-18 MED ORDER — DILTIAZEM HCL ER COATED BEADS 120 MG PO CP24: 120.0000 mg | ORAL_CAPSULE | Freq: Every day | ORAL | 3 refills | Status: DC

## 2016-12-18 NOTE — Progress Notes (Signed)
Cardiology Office Note  Date: 12/18/2016   ID: Diane Cohen, DOB 08-Aug-1945, MRN [id number]  PCP: Sinda Du, MD  Primary Cardiologist: Rozann Lesches, MD   Chief Complaint  Patient presents with  . Atrial Fibrillation    History of Present Illness: Diane Cohen is a 71 y.o. female last seen in August. She was referred subsequently for cardioversion of recurrent, persistent atrial fibrillation. Procedure was performed successfully by Dr. Harl Bowie on September 7 with restoration of sinus rhythm following a single 200 J synchronized shock. She states that she did feel better initially, somewhat more energy, less leg swelling. Within about a week however, she feels like she went back into atrial fibrillation, and her ECG confirms this today.  I reviewed her medications. Current regimen includes Xarelto, Toprol-XL, Demadex, potassium supplements, lisinopril, amiodarone, and Norvasc. She is now status post two cardioversion attempts within the last year, and since she has recently reverted to atrial fibrillation even on amiodarone, we will plan to refer her for EP consultation.  I personally reviewed her ECG today which shows atrial fibrillation with nonspecific T-wave changes.  Past Medical History:  Diagnosis Date  . Anxiety   . Atrial fibrillation ([place])   . Chronic diastolic heart failure ([place])   . Depression   . Essential hypertension   . Hyperlipidemia   . Hypothyroidism   . Pneumonia   . TIA (transient ischemic attack)     Past Surgical History:  Procedure Laterality Date  . ABDOMINAL HYSTERECTOMY    . BREAST SURGERY     biopsy  . CARDIOVERSION N/A 01/23/2016   Procedure: CARDIOVERSION;  Surgeon: Satira Sark, MD;  [facility];  Service: Cardiovascular;  Laterality: N/A;  . CARDIOVERSION N/A 11/13/2016   Procedure: CARDIOVERSION;  Surgeon: Arnoldo Lenis, MD;  Location: [facility];  Service: Endoscopy;  Laterality: N/A;  . CHOLECYSTECTOMY      . coloscopy      Current Outpatient Prescriptions  Medication Sig Dispense Refill  . acetaminophen (TYLENOL) 500 MG tablet Take 1,000 mg by mouth every 6 (six) hours as needed.    Marland Kitchen albuterol (PROVENTIL HFA;VENTOLIN HFA) 108 (90 BASE) MCG/ACT inhaler Inhale 1-2 puffs into the lungs every 6 (six) hours as needed for wheezing or shortness of breath.    . Calcium Carbonate-Vit D-Min (CALTRATE PLUS PO) Take 1 tablet by mouth 2 (two) times daily.     . Cyanocobalamin (B-12 COMPLIANCE INJECTION) 1000 MCG/ML KIT Inject 1 mL as directed every 14 (fourteen) days.     . DULoxetine (CYMBALTA) 60 MG capsule Take 60 mg by mouth daily.    Marland Kitchen estradiol (ESTRACE) 1 MG tablet TAKE 1 TABLET BY MOUTH EVERY NIGHT AT BEDTIME 90 tablet 3  . ferrous sulfate 325 (65 FE) MG tablet Take 325 mg by mouth daily with breakfast.    . levothyroxine (SYNTHROID, LEVOTHROID) 137 MCG tablet Take 137 mcg by mouth daily before breakfast.     . lisinopril (PRINIVIL,ZESTRIL) 20 MG tablet Take 1 tablet (20 mg total) by mouth daily.    . magnesium oxide (MAG-OX) 400 MG tablet Take 400 mg by mouth daily.    . metoprolol succinate (TOPROL-XL) 100 MG 24 hr tablet Take 100 mg by mouth daily. Take with or immediately following a meal.    . omeprazole (PRILOSEC) 40 MG capsule Take 1 capsule by mouth daily.     . potassium chloride SA (K-DUR,KLOR-CON) 20 MEQ tablet Take 2 tablets (40 mEq total) by  mouth daily. 180 tablet 3  . rizatriptan (MAXALT-MLT) 10 MG disintegrating tablet Take 10 mg by mouth as needed for migraine. Reported on 04/30/2015    . torsemide (DEMADEX) 20 MG tablet TAKE 2 TABLETS(40 MG) BY MOUTH EVERY MORNING 60 tablet 6  . XARELTO 20 MG TABS tablet TAKE 1 TABLET BY MOUTH EVERY DAY WITH DINNER 30 tablet 6  . diltiazem (CARDIZEM CD) 120 MG 24 hr capsule Take 1 capsule (120 mg total) by mouth daily. 90 capsule 3   No current facility-administered medications for this visit.    Allergies:  Levaquin [levofloxacin] and Sulfa  antibiotics   Social History: The patient  reports that she quit smoking about 43 years ago. Her smoking use included Cigarettes. She smoked 0.50 packs per day. She has never used smokeless tobacco. She reports that she does not drink alcohol or use drugs.   ROS:  Please see the history of present illness. Otherwise, complete review of systems is positive for none.  All other systems are reviewed and negative.   Physical Exam: VS:  BP 122/78   Pulse (!) 104   Ht _0  (1.549 m)   Wt 182 lb (82.6 kg)   SpO2 99%   BMI 34.39 kg/m , BMI Body mass index is 34.39 kg/m.  Wt Readings from Last 3 Encounters:  12/18/16 182 lb (82.6 kg)  11/13/16 181 lb (82.1 kg)  11/06/16 181 lb (82.1 kg)    General: Obese woman, appears comfortable at rest. HEENT: Conjunctiva and lids normal, oropharynx clear. Neck: Supple, no elevated JVP or carotid bruits, no thyromegaly. Lungs: No wheezing, nonlabored breathing at rest. Cardiac: Irregularly irregular, no S3, 2/6 systolic murmur, no pericardial rub. Abdomen: Soft, nontender, bowel sounds present, no guarding or rebound. Extremities: Chronic appearing bilateral leg edema, distal pulses 2+. Skin: Warm and dry. Musculoskeletal: No kyphosis. Neuropsychiatric: Alert and oriented x3, affect grossly appropriate.  ECG: I personally reviewed the tracing from 11/13/2016 which showed sinus rhythm with PAC.  Recent Labwork: 01/22/2016: Hemoglobin 11.2; Platelets 288 04/14/2016: TSH 5.858 11/06/2016: ALT 16; AST 22; BUN 15; Creatinine, Ser 1.14; Potassium 4.1; Sodium 137   Other Studies Reviewed Today:  Study Conclusions  - Left ventricle: The cavity size was normal. Wall thickness was   increased in a pattern of mild LVH. Systolic function was normal.   The estimated ejection fraction was in the range of 60% to 65%.   Wall motion was normal; there were no regional wall motion   abnormalities. Features are consistent with a pseudonormal left   ventricular  filling pattern, with concomitant abnormal relaxation   and increased filling pressure (grade 2 diastolic dysfunction). - Mitral valve: There was mild regurgitation. - Right atrium: The atrium was mildly dilated. There was the   appearance of a Chiari network. - Tricuspid valve: There was mild regurgitation. - Pulmonary arteries: PA peak pressure: 25 mm Hg (S). - Pericardium, extracardiac: There was no pericardial effusion.  Impressions:  - Mild LVH with LVEF 60-65% and grade 2 diastolic dysfunction with   increased filling pressure. Mild mitral regurgitation. Mild   tricuspid regurgitation with normal PASP 25 mmHg. Chiari network   noted in the right atrium.  Assessment and Plan:  1. Persistent, symptomatic atrial fibrillation. She underwent cardioversion recently in September with initial return to sinus rhythm, but it sounds like she went back into atrial fibrillation within a week based on symptoms. ECG confirms this today. This is now her second cardioversion within the year. I  reviewed her medications and at this point we will stop amiodarone, change from Norvasc to Cardizem CD 120 mg daily, otherwise continue Toprol-XL and Xarelto for stroke prophylaxis. CHADSVASC score is 5. We will refer her for EP consultation to discuss other treatment options. Could consider strategy of heart rate control and anticoagulation, although I am not certain that she would tolerate this in terms of her symptoms. Different antiarrhythmic versus possible ablation would be the other approachs.  2. Essential hypertension. Continue with medical therapy and follow-up with Dr. Luan Pulling.  3. Hypothyroidism, on Synthroid. TSH was been within normal range, followed by Dr. Luan Pulling.  4. Obesity, no history of obstructive sleep apnea. This could be further evaluated as well.  Current medicines were reviewed with the patient today.   Orders Placed This Encounter  Procedures  . Ambulatory referral to Cardiac  Electrophysiology  . EKG 12-Lead    Disposition: Follow-up in 3 months.  Signed, Satira Sark, MD, [facility] 12/18/2016 12:04 PM    [place] [facility] [address], [place], [place] [address] Phone: [phone]; Fax: [phone]

## 2016-12-18 NOTE — Patient Instructions (Signed)
Your physician recommends that you schedule a follow-up appointment in: 3 months with Dr.McDowell    STOP Amiodarone  STOP Norvasc   START Cardizem CD 120 mg daily    You have been referred to [facility], Dr.Allred   No lab work or test ordered today   Thank you for choosing [facility] !

## 2016-12-28 DIAGNOSIS — Z0000 Encounter for general adult medical examination without abnormal findings: Secondary | ICD-10-CM | POA: Diagnosis not present

## 2016-12-28 DIAGNOSIS — Z23 Encounter for immunization: Secondary | ICD-10-CM | POA: Diagnosis not present

## 2017-01-20 ENCOUNTER — Encounter (INDEPENDENT_AMBULATORY_CARE_PROVIDER_SITE_OTHER): Payer: Self-pay

## 2017-01-20 ENCOUNTER — Ambulatory Visit (INDEPENDENT_AMBULATORY_CARE_PROVIDER_SITE_OTHER): Payer: Medicare PPO | Admitting: Internal Medicine

## 2017-01-20 ENCOUNTER — Encounter: Payer: Self-pay | Admitting: Internal Medicine

## 2017-01-20 VITALS — BP 130/78 | HR 123 | Ht 61.0 in | Wt 185.6 lb

## 2017-01-20 DIAGNOSIS — I481 Persistent atrial fibrillation: Secondary | ICD-10-CM | POA: Diagnosis not present

## 2017-01-20 DIAGNOSIS — R0683 Snoring: Secondary | ICD-10-CM

## 2017-01-20 DIAGNOSIS — I4819 Other persistent atrial fibrillation: Secondary | ICD-10-CM

## 2017-01-20 MED ORDER — DILTIAZEM HCL ER COATED BEADS 120 MG PO CP24: 240.0000 mg | ORAL_CAPSULE | Freq: Every day | ORAL | 3 refills | Status: DC

## 2017-01-20 NOTE — Progress Notes (Signed)
Electrophysiology Office Note   Date:  01/20/2017   ID:  Hally, Colella [birth year], MRN [id number]  PCP:  Sinda Du, MD  Cardiologist:  Dr Domenic Polite Primary Electrophysiologist: Thompson Grayer, MD    Chief Complaint  Patient presents with  . Atrial Fibrillation     History of Present Illness: Diane Cohen is a 71 y.o. female who presents today for electrophysiology evaluation.   The patient is referred by Dr Domenic Polite for EP consultation regarding persistent atrial fibrillation.  The patient reports having atrial fibrillation for over 5 years.  She has been on amiodarone for that time.  About a year ago, she had recurrence requiring cardioversion but did well.  In August of this year, she returned to afib.  She underwent cardioversion while taking amiodarone but only remained in sinus rhythm for a week.  She is unaware of triggers for her afib.  She reports fatigue and decreased exercise tolerance when in afib.  She feels "much better" in sinus rhythm.  Today, she denies symptoms of palpitations, chest pain, shortness of breath, orthopnea, PND, lower extremity edema, claudication, dizziness, presyncope, syncope, bleeding, or neurologic sequela. The patient is tolerating medications without difficulties and is otherwise without complaint today.    Past Medical History:  Diagnosis Date  . Anxiety   . Atrial fibrillation ([place])   . Chronic diastolic heart failure ([place])   . Depression   . Essential hypertension   . Hyperlipidemia   . Hypothyroidism   . Pneumonia   . TIA (transient ischemic attack)    Past Surgical History:  Procedure Laterality Date  . ABDOMINAL HYSTERECTOMY    . BREAST SURGERY     biopsy  . CHOLECYSTECTOMY    . coloscopy       Current Outpatient Medications  Medication Sig Dispense Refill  . acetaminophen (TYLENOL) 500 MG tablet Take 1,000 mg by mouth every 6 (six) hours as needed.    Marland Kitchen albuterol (PROVENTIL HFA;VENTOLIN HFA) 108 (90 BASE) MCG/ACT  inhaler Inhale 1-2 puffs into the lungs every 6 (six) hours as needed for wheezing or shortness of breath.    . Calcium Carbonate-Vit D-Min (CALTRATE PLUS PO) Take 1 tablet by mouth 2 (two) times daily.     . Cyanocobalamin (B-12 COMPLIANCE INJECTION) 1000 MCG/ML KIT Inject 1 mL as directed every 14 (fourteen) days.     Marland Kitchen diltiazem (CARDIZEM CD) 120 MG 24 hr capsule Take 1 capsule (120 mg total) by mouth daily. 90 capsule 3  . DULoxetine (CYMBALTA) 60 MG capsule Take 60 mg by mouth daily.    Marland Kitchen estradiol (ESTRACE) 1 MG tablet TAKE 1 TABLET BY MOUTH EVERY NIGHT AT BEDTIME 90 tablet 3  . ferrous sulfate 325 (65 FE) MG tablet Take 325 mg by mouth daily with breakfast.    . levothyroxine (SYNTHROID, LEVOTHROID) 137 MCG tablet Take 137 mcg by mouth daily before breakfast.     . lisinopril (PRINIVIL,ZESTRIL) 20 MG tablet Take 1 tablet (20 mg total) by mouth daily.    . magnesium oxide (MAG-OX) 400 MG tablet Take 400 mg by mouth daily.    . metoprolol succinate (TOPROL-XL) 100 MG 24 hr tablet Take 100 mg by mouth daily. Take with or immediately following a meal.    . Multiple Vitamins-Minerals (CENTRUM ADULTS PO) Take 1 capsule daily by mouth.    Marland Kitchen omeprazole (PRILOSEC) 40 MG capsule Take 1 capsule by mouth daily.     . potassium chloride SA (K-DUR,KLOR-CON) 20 MEQ tablet Take  2 tablets (40 mEq total) by mouth daily. 180 tablet 3  . rizatriptan (MAXALT-MLT) 10 MG disintegrating tablet Take 10 mg by mouth as needed for migraine. Reported on 04/30/2015    . torsemide (DEMADEX) 20 MG tablet TAKE 2 TABLETS(40 MG) BY MOUTH EVERY MORNING 60 tablet 6  . XARELTO 20 MG TABS tablet TAKE 1 TABLET BY MOUTH EVERY DAY WITH DINNER 30 tablet 6   No current facility-administered medications for this visit.     Allergies:   Levaquin [levofloxacin] and Sulfa antibiotics   Social History:  The patient  reports that she quit smoking about 44 years ago. Her smoking use included cigarettes. She smoked 0.50 packs per day. she  has never used smokeless tobacco. She reports that she does not drink alcohol or use drugs.   Family History:  The patient's  family history includes Anemia in her sister; Hypertension in her father, mother, sister, and unknown relative.    ROS:  Please see the history of present illness.   All other systems are personally reviewed and negative.    PHYSICAL EXAM: VS:  BP 130/78   Pulse (!) 123   Ht 5' 1" (1.549 m)   Wt 185 lb 9.6 oz (84.2 kg)   SpO2 94%   BMI 35.07 kg/m  , BMI Body mass index is 35.07 kg/m. GEN: Well nourished, well developed, in no acute distress  HEENT: normal  Neck: no JVD, carotid bruits, or masses Cardiac: iRRR; no murmurs, rubs, or gallops,no edema  Respiratory:  clear to auscultation bilaterally, normal work of breathing GI: soft, nontender, nondistended, + BS MS: no deformity or atrophy  Skin: warm and dry  Neuro:  Strength and sensation are intact Psych: euthymic mood, full affect  EKG:  EKG is ordered today. The ekg ordered today is personally reviewed and shows afib with V rates 120s   Recent Labs: 01/22/2016: Hemoglobin 11.2; Platelets 288 04/14/2016: TSH 5.858 11/06/2016: ALT 16; BUN 15; Creatinine, Ser 1.14; Potassium 4.1; Sodium 137  personally reviewed   Lipid Panel  No results found for: CHOL, TRIG, HDL, CHOLHDL, VLDL, LDLCALC, LDLDIRECT personally reviewed   Wt Readings from Last 3 Encounters:  01/20/17 185 lb 9.6 oz (84.2 kg)  12/18/16 182 lb (82.6 kg)  11/13/16 181 lb (82.1 kg)    Other studies personally reviewed: Additional studies/ records that were reviewed today include: Dr Chuck Hint notes, prior echo  Review of the above records today demonstrates: as above   ASSESSMENT AND PLAN:  1.  Persistent atrial fibrillation The patient has symptomatic persistent afib.  She has failed medical therapy with amiodarone. Therapeutic strategies for afib including medicine and ablation were discussed in detail with the patient today.  Risk, benefits, and alternatives to EP study and radiofrequency ablation for afib were also discussed in detail today. These risks include but are not limited to stroke, bleeding, vascular damage, tamponade, perforation, damage to the esophagus, lungs, and other structures, pulmonary vein stenosis, worsening renal function, and death. The patient understands these risk and wishes to proceed.  We will therefore proceed with catheter ablation at the next available time.  Will plan TEE prior to ablation. Increase diltiazem CD to 225m daily in the interim.  2. Snoring Will need sleep study arranged post ablation  3. Overweight Body mass index is 35.07 kg/m. Lifestyle modification encouraged    Current medicines are reviewed at length with the patient today.   The patient does not have concerns regarding her medicines.  The following  changes were made today:  none   Signed, Thompson Grayer, MD  01/20/2017 2:50 PM     [address] [place] [place] [address] [phone] (office) [phone] (fax)

## 2017-01-20 NOTE — Patient Instructions (Addendum)
Medication Instructions:  Your physician has recommended you make the following change in your medication:   1.) INCREASE Diltiazem to 240 mg daily  -- If you need a refill on your cardiac medications before your next appointment, please call your pharmacy. --  Labwork: Your physician recommends that you have lab work today:  CBC/BMET   Testing/Procedures: Your physician has requested that you have a TEE. During a TEE, sound waves are used to create images of your heart. It provides your doctor with information about the size and shape of your heart and how well your heart's chambers and valves are working. In this test, a transducer is attached to the end of a flexible tube that's guided down your throat and into your esophagus (the tube leading from you mouth to your stomach) to get a more detailed image of your heart. You are not awake for the procedure. Please see the instruction sheet given to you today. For further information please visit [email or web address].     Your physician has recommended that you have an ablation on 11/20. Catheter ablation is a medical procedure used to treat some cardiac arrhythmias (irregular heartbeats). During catheter ablation, a long, thin, flexible tube is put into a blood vessel in your groin (upper thigh), or neck. This tube is called an ablation catheter. It is then guided to your heart through the blood vessel. Radio frequency waves destroy small areas of heart tissue where abnormal heartbeats may cause an arrhythmia to start. Please see the instruction sheet given to you today.  Please arrive at [facility] at 8:00 am Do not miss any doses of XARELTO Do not eat or drink after midnight the night prior to the procedure Do not take any medications the morning of the test Plan for one night stay Will need someone to drive you home at discharge    Follow-Up: Your physician wants you to follow-up in: 4 weeks  with Roderic Palau (A fib clinic) and 3 months with Dr. Rayann Heman.    Thank you for choosing [facility]!!   Frederik Schmidt, RN [phone]  Any Other Special Instructions Will Be Listed Below (If Applicable).

## 2017-01-21 LAB — CBC WITH DIFFERENTIAL/PLATELET
BASOS ABS: 0 10*3/uL (ref 0.0–0.2)
BASOS: 0 %
EOS (ABSOLUTE): 0.1 10*3/uL (ref 0.0–0.4)
Eos: 1 %
HEMOGLOBIN: 12.4 g/dL (ref 11.1–15.9)
Hematocrit: 39.5 % (ref 34.0–46.6)
IMMATURE GRANS (ABS): 0 10*3/uL (ref 0.0–0.1)
IMMATURE GRANULOCYTES: 0 %
Lymphocytes Absolute: 1.2 10*3/uL (ref 0.7–3.1)
Lymphs: 22 %
MCH: 28.6 pg (ref 26.6–33.0)
MCHC: 31.4 g/dL — ABNORMAL LOW (ref 31.5–35.7)
MCV: 91 fL (ref 79–97)
Monocytes Absolute: 0.5 10*3/uL (ref 0.1–0.9)
Monocytes: 8 %
NEUTROS ABS: 3.9 10*3/uL (ref 1.4–7.0)
NEUTROS PCT: 69 %
PLATELETS: 299 10*3/uL (ref 150–379)
RBC: 4.33 x10E6/uL (ref 3.77–5.28)
RDW: 19.2 % — ABNORMAL HIGH (ref 12.3–15.4)
WBC: 5.7 10*3/uL (ref 3.4–10.8)

## 2017-01-21 LAB — BASIC METABOLIC PANEL
BUN/Creatinine Ratio: 13 (ref 12–28)
BUN: 14 mg/dL (ref 8–27)
CHLORIDE: 101 mmol/L (ref 96–106)
CO2: 26 mmol/L (ref 20–29)
Calcium: 10.2 mg/dL (ref 8.7–10.3)
Creatinine, Ser: 1.04 mg/dL — ABNORMAL HIGH (ref 0.57–1.00)
GFR calc Af Amer: 62 mL/min/{1.73_m2} (ref >59)
GFR calc non Af Amer: 54 mL/min/{1.73_m2} — ABNORMAL LOW (ref >59)
GLUCOSE: 90 mg/dL (ref 65–99)
POTASSIUM: 4.1 mmol/L (ref 3.5–5.2)
SODIUM: 142 mmol/L (ref 134–144)

## 2017-01-25 ENCOUNTER — Telehealth: Payer: Self-pay | Admitting: Internal Medicine

## 2017-01-25 NOTE — Telephone Encounter (Signed)
Spoke with patient on 11/19 about cancelling her Afib Ablation for 11/20 due to family condition.  I informed her to schedule an office visit closer to a date that works for her.  She will make family arrangements and contact us after the holidays.  She verbalized an understanding.

## 2017-01-25 NOTE — Telephone Encounter (Signed)
New Message  Pt call to reschedule procedure set for 11/20. Please call back to discuss

## 2017-01-25 NOTE — Telephone Encounter (Signed)
LPMTCB 11/20

## 2017-01-26 ENCOUNTER — Encounter (HOSPITAL_COMMUNITY): Admission: RE | Payer: Self-pay | Source: Ambulatory Visit

## 2017-01-26 ENCOUNTER — Ambulatory Visit (HOSPITAL_COMMUNITY): Admission: RE | Admit: 2017-01-26 | Payer: Medicare PPO | Source: Ambulatory Visit | Admitting: Internal Medicine

## 2017-01-26 SURGERY — ATRIAL FIBRILLATION ABLATION
Anesthesia: Monitor Anesthesia Care

## 2017-01-26 SURGERY — ECHOCARDIOGRAM, TRANSESOPHAGEAL
Anesthesia: Moderate Sedation

## 2017-02-24 ENCOUNTER — Ambulatory Visit (HOSPITAL_COMMUNITY): Payer: Self-pay | Admitting: Nurse Practitioner

## 2017-03-30 DIAGNOSIS — I10 Essential (primary) hypertension: Secondary | ICD-10-CM | POA: Diagnosis not present

## 2017-03-30 DIAGNOSIS — J441 Chronic obstructive pulmonary disease with (acute) exacerbation: Secondary | ICD-10-CM | POA: Diagnosis not present

## 2017-03-30 DIAGNOSIS — I5030 Unspecified diastolic (congestive) heart failure: Secondary | ICD-10-CM | POA: Diagnosis not present

## 2017-03-30 DIAGNOSIS — I482 Chronic atrial fibrillation: Secondary | ICD-10-CM | POA: Diagnosis not present

## 2017-04-14 NOTE — Progress Notes (Signed)
Cardiology Office Note  Date: 04/15/2017   ID: Diane Cohen, Diane Cohen 31-Oct-1945, MRN [id number]  PCP: Diane Du, MD  Primary Cardiologist: Diane Lesches, MD   Chief Complaint  Patient presents with  . Atrial Fibrillation    History of Present Illness: Diane Cohen is a 72 y.o. female last seen in October 2018.  She was referred subsequently to Dr. Rayann Cohen for EP consultation regarding possible ablation for symptomatic persistent atrial fibrillation.  Procedure was actually scheduled but had to be canceled due to family concerns.  Her husband has Diane Cohen disease and she was not able to arrange adequate coverage while she was gone.  She plans to see Dr. Rayann Cohen this month to discuss rescheduling the procedure.  She states that she has not noticed any changes, still with relative fatigue, vague sense of palpitations. No chest pain.  Current cardiac regimen includes Cardizem CD, lisinopril, Toprol-XL, Demadex, potassium supplements, and Xarelto.  She does not report any bleeding problems.  Past Medical History:  Diagnosis Date  . Anxiety   . Atrial fibrillation ([place])   . Chronic diastolic heart failure ([place])   . Depression   . Essential hypertension   . Hyperlipidemia   . Hypothyroidism   . Pneumonia   . TIA (transient ischemic attack)     Past Surgical History:  Procedure Laterality Date  . ABDOMINAL HYSTERECTOMY    . BREAST SURGERY     biopsy  . CARDIOVERSION N/A 01/23/2016   Procedure: CARDIOVERSION;  Surgeon: Diane Sark, MD;  [facility];  Service: Cardiovascular;  Laterality: N/A;  . CARDIOVERSION N/A 11/13/2016   Procedure: CARDIOVERSION;  Surgeon: Diane Lenis, MD;  [facility];  Service: Endoscopy;  Laterality: N/A;  . CHOLECYSTECTOMY    . coloscopy      Current Outpatient Medications  Medication Sig Dispense Refill  . acetaminophen (TYLENOL) 650 MG CR tablet Take 1,300 mg every 8 (eight) hours as needed by mouth for  pain.    Marland Kitchen albuterol (PROVENTIL HFA;VENTOLIN HFA) 108 (90 BASE) MCG/ACT inhaler Inhale 2 puffs every 6 (six) hours as needed into the lungs for wheezing or shortness of breath.     . calcium carbonate (TUMS - DOSED IN MG ELEMENTAL CALCIUM) 500 MG chewable tablet Chew 4 tablets 2 (two) times daily as needed by mouth for indigestion or heartburn.    . Calcium Carbonate-Vitamin D (CALCIUM 600+D PO) Take 1 tablet 2 (two) times daily by mouth.    . Cyanocobalamin (B-12 COMPLIANCE INJECTION) 1000 MCG/ML KIT Inject 1 mL as directed every 14 (fourteen) days.     . DULoxetine (CYMBALTA) 60 MG capsule Take 60 mg by mouth daily.    Marland Kitchen estradiol (ESTRACE) 1 MG tablet TAKE 1 TABLET BY MOUTH EVERY NIGHT AT BEDTIME 90 tablet 3  . ferrous sulfate 325 (65 FE) MG tablet Take 325 mg by mouth daily with breakfast.    . levothyroxine (SYNTHROID, LEVOTHROID) 137 MCG tablet Take 137 mcg by mouth daily before breakfast.     . lisinopril (PRINIVIL,ZESTRIL) 20 MG tablet Take 1 tablet (20 mg total) by mouth daily.    . magnesium oxide (MAG-OX) 400 MG tablet Take 400 mg by mouth daily.    . metoprolol succinate (TOPROL-XL) 100 MG 24 hr tablet Take 100 mg by mouth daily. Take with or immediately following a meal.    . Multiple Vitamins-Minerals (CENTRUM ADULTS PO) Take 1 capsule daily by mouth.    Marland Kitchen omeprazole (PRILOSEC)  40 MG capsule Take 40 mg daily by mouth.     . potassium chloride SA (K-DUR,KLOR-CON) 20 MEQ tablet Take 2 tablets (40 mEq total) by mouth daily. 180 tablet 3  . rizatriptan (MAXALT-MLT) 10 MG disintegrating tablet Take 10 mg by mouth as needed for migraine. Reported on 04/30/2015    . torsemide (DEMADEX) 20 MG tablet TAKE 2 TABLETS(40 MG) BY MOUTH EVERY MORNING 60 tablet 6  . XARELTO 20 MG TABS tablet TAKE 1 TABLET BY MOUTH EVERY DAY WITH DINNER 30 tablet 6  . diltiazem (CARDIZEM CD) 240 MG 24 hr capsule Take 1 capsule (240 mg total) by mouth daily. 90 capsule 3   No current facility-administered medications  for this visit.    Allergies:  Levaquin [levofloxacin] and Sulfa antibiotics   Social History: The patient  reports that she quit smoking about 44 years ago. Her smoking use included cigarettes. She smoked 0.50 packs per day. she has never used smokeless tobacco. She reports that she does not drink alcohol or use drugs.   ROS:  Please see the history of present illness. Otherwise, complete review of systems is positive for none.  All other systems are reviewed and negative.   Physical Exam: VS:  BP 128/66   Pulse 88   Ht _0  (1.549 m)   Wt 185 lb (83.9 kg)   SpO2 94%   BMI 34.96 kg/m , BMI Body mass index is 34.96 kg/m.  Wt Readings from Last 3 Encounters:  04/15/17 185 lb (83.9 kg)  01/20/17 185 lb 9.6 oz (84.2 kg)  12/18/16 182 lb (82.6 kg)    General: Obese woman, appears comfortable at rest. HEENT: Conjunctiva and lids normal, oropharynx clear. Neck: Supple, no elevated JVP or carotid bruits, no thyromegaly. Lungs: Clear to auscultation, nonlabored breathing at rest. Cardiac: Irregularly irregular, no S3, 2/6 systolic murmur. Abdomen: Soft, nontender, bowel sounds present. Extremities: Chronic stable appearing leg edema, distal pulses 2+. Skin: Warm and dry. Musculoskeletal: No kyphosis. Neuropsychiatric: Alert and oriented x3, affect grossly appropriate.  ECG: I personally reviewed the tracing from 01/20/2017 which showed atrial fibrillation with RVR.  Recent Labwork: 11/06/2016: ALT 16; AST 22 01/20/2017: BUN 14; Creatinine, Ser 1.04; Hemoglobin 12.4; Platelets 299; Potassium 4.1; Sodium 142  Other Studies Reviewed Today:  Echocardiogram 04/11/2015: Study Conclusions  - Left ventricle: The cavity size was normal. Wall thickness was   increased in a pattern of mild LVH. Systolic function was normal.   The estimated ejection fraction was in the range of 60% to 65%.   Wall motion was normal; there were no regional wall motion   abnormalities. Features are consistent  with a pseudonormal left   ventricular filling pattern, with concomitant abnormal relaxation   and increased filling pressure (grade 2 diastolic dysfunction). - Mitral valve: There was mild regurgitation. - Right atrium: The atrium was mildly dilated. There was the   appearance of a Chiari network. - Tricuspid valve: There was mild regurgitation. - Pulmonary arteries: PA peak pressure: 25 mm Hg (S). - Pericardium, extracardiac: There was no pericardial effusion.  Impressions:  - Mild LVH with LVEF 60-65% and grade 2 diastolic dysfunction with   increased filling pressure. Mild mitral regurgitation. Mild   tricuspid regurgitation with normal PASP 25 mmHg. Chiari network   noted in the right atrium.  Assessment and Plan:  1.  Persistent atrial fibrillation with CHADSVASC score of 5.  Plan to continue present regimen as detailed above.  She has follow-up pending with Dr.  Allred to discuss rescheduling of atrial fibrillation ablation.  2.  Essential hypertension, blood pressure is adequately controlled today.  Current medicines were reviewed with the patient today.  Disposition: Follow-up in 3 months.  Signed, Diane Sark, MD, [facility] 04/15/2017 2:23 PM    [facility] at [facility] [address], [place], [place] [address] Phone: [phone]; Fax: [phone]

## 2017-04-15 ENCOUNTER — Encounter: Payer: Self-pay | Admitting: Cardiology

## 2017-04-15 ENCOUNTER — Ambulatory Visit (INDEPENDENT_AMBULATORY_CARE_PROVIDER_SITE_OTHER): Payer: Medicare PPO | Admitting: Cardiology

## 2017-04-15 VITALS — BP 128/66 | HR 88 | Ht 61.0 in | Wt 185.0 lb

## 2017-04-15 DIAGNOSIS — I10 Essential (primary) hypertension: Secondary | ICD-10-CM | POA: Diagnosis not present

## 2017-04-15 DIAGNOSIS — I4819 Other persistent atrial fibrillation: Secondary | ICD-10-CM

## 2017-04-15 DIAGNOSIS — I481 Persistent atrial fibrillation: Secondary | ICD-10-CM | POA: Diagnosis not present

## 2017-04-15 MED ORDER — DILTIAZEM HCL ER COATED BEADS 240 MG PO CP24: 240.0000 mg | ORAL_CAPSULE | Freq: Every day | ORAL | 3 refills | Status: DC

## 2017-04-15 NOTE — Patient Instructions (Signed)
Your physician recommends that you schedule a follow-up appointment in: 3 months with Dr Domenic Polite   Take Cardizem 240 mg daily    No lab work or tests ordered today.   Thank you for choosing [facility] !

## 2017-04-26 ENCOUNTER — Ambulatory Visit: Payer: Self-pay | Admitting: Internal Medicine

## 2017-05-05 ENCOUNTER — Telehealth: Payer: Self-pay | Admitting: Cardiology

## 2017-05-05 NOTE — Telephone Encounter (Signed)
Patient informed and verbalized understanding of plan. Patient rescheduled with Allred.

## 2017-05-05 NOTE — Telephone Encounter (Signed)
I think we should make sure that she has follow-up with Dr. Rayann Heman since ablation was the plan going forward.  Another cardioversion would not necessarily be a long-term fix for her.

## 2017-05-05 NOTE — Telephone Encounter (Signed)
Spoke with patient and she asked if she could have her heart shocked again verses the ablation. Patient advised that usually the ablation is suggested after two failed DCCV's but message would be sent to Dr. Domenic Polite for further advice. Patient verbalized understanding.

## 2017-05-05 NOTE — Telephone Encounter (Signed)
Please give pt a call concerning ablation --can be reached @ [phone]

## 2017-06-07 ENCOUNTER — Other Ambulatory Visit: Payer: Self-pay | Admitting: Cardiology

## 2017-06-14 ENCOUNTER — Telehealth: Payer: Self-pay | Admitting: *Deleted

## 2017-06-14 ENCOUNTER — Ambulatory Visit (INDEPENDENT_AMBULATORY_CARE_PROVIDER_SITE_OTHER): Payer: Medicare PPO | Admitting: Internal Medicine

## 2017-06-14 ENCOUNTER — Encounter: Payer: Self-pay | Admitting: Internal Medicine

## 2017-06-14 VITALS — BP 152/80 | HR 114 | Ht 61.0 in | Wt 189.0 lb

## 2017-06-14 DIAGNOSIS — I4819 Other persistent atrial fibrillation: Secondary | ICD-10-CM

## 2017-06-14 DIAGNOSIS — I481 Persistent atrial fibrillation: Secondary | ICD-10-CM | POA: Diagnosis not present

## 2017-06-14 DIAGNOSIS — R0683 Snoring: Secondary | ICD-10-CM

## 2017-06-14 NOTE — Telephone Encounter (Signed)
-----   Message from Damian Leavell, RN sent at 06/14/2017  1:27 PM EDT ----- Regarding: sleep study Referring for a sleep study for snoring.  ESS complete with score of 18.  Referred by Dr. Lucienne Capers RN

## 2017-06-14 NOTE — Patient Instructions (Addendum)
Medication Instructions:  Your physician recommends that you continue on your current medications as directed. Please refer to the Current Medication list given to you today.  Labwork: You will get lab work on arrival for your procedures.  Testing/Procedures: Your physician has requested that you have a TEE. During a TEE, sound waves are used to create images of your heart. It provides your doctor with information about the size and shape of your heart and how well your heart's chambers and valves are working. In this test, a transducer is attached to the end of a flexible tube that's guided down your throat and into your esophagus (the tube leading from you mouth to your stomach) to get a more detailed image of your heart. You are not awake for the procedure. Please see the instruction sheet given to you today. For further information please visit [email or web address].  Your physician has recommended that you have an ablation. Catheter ablation is a medical procedure used to treat some cardiac arrhythmias (irregular heartbeats). During catheter ablation, a long, thin, flexible tube is put into a blood vessel in your groin (upper thigh), or neck. This tube is called an ablation catheter. It is then guided to your heart through the blood vessel. Radio frequency waves destroy small areas of heart tissue where abnormal heartbeats may cause an arrhythmia to start. Please see the instruction sheet given to you today.  Follow-Up:  You will follow up with Diane Palau NP 4 weeks after your procedure.  You will follow up with Diane Cohen 3 months after your procedure.  Any Other Special Instructions Will Be Listed Below (If Applicable).  You are scheduled for a TEE on July 06, 2017 with Diane Cohen.  Please arrive at the [facility] (Main Entrance A) at [facility]: [address] [place], [place] [address] at 8:00 am.   DIET: Nothing to eat or drink after midnight   Medication  Instructions: Hold your morning medications  Labs: Your labs will be drawn upon arrival  Your atrial fibrillation ablation will be after your TEE.    This is scheduled for 1:30 pm, but it may start earlier. Plan for one night stay You will need someone to drive you home at discharge    Cardiac Ablation Cardiac ablation is a procedure to disable (ablate) a small amount of heart tissue in very specific places. The heart has many electrical connections. Sometimes these connections are abnormal and can cause the heart to beat very fast or irregularly. Ablating some of the problem areas can improve the heart rhythm or return it to normal. Ablation may be done for people who:  Have Wolff-Parkinson-White syndrome.  Have fast heart rhythms (tachycardia).  Have taken medicines for an abnormal heart rhythm (arrhythmia) that were not effective or caused side effects.  Have a high-risk heartbeat that may be life-threatening.  During the procedure, a small incision is made in the neck or the groin, and a long, thin, flexible tube (catheter) is inserted into the incision and moved to the heart. Small devices (electrodes) on the tip of the catheter will send out electrical currents. A type of X-ray (fluoroscopy) will be used to help guide the catheter and to provide images of the heart. Tell a health care provider about:  Any allergies you have.  All medicines you are taking, including vitamins, herbs, eye drops, creams, and over-the-counter medicines.  Any problems you or family members have had with anesthetic medicines.  Any blood disorders you have.  Any surgeries you have had.  Any medical conditions you have, such as kidney failure.  Whether you are pregnant or may be pregnant. What are the risks? Generally, this is a safe procedure. However, problems may occur, including:  Infection.  Bruising and bleeding at the catheter insertion site.  Bleeding into the chest, especially into  the sac that surrounds the heart. This is a serious complication.  Stroke or blood clots.  Damage to other structures or organs.  Allergic reaction to medicines or dyes.  Need for a permanent pacemaker if the normal electrical system is damaged. A pacemaker is a small computer that sends electrical signals to the heart and helps your heart beat normally.  The procedure not being fully effective. This may not be recognized until months later. Repeat ablation procedures are sometimes required.  What happens before the procedure?  Follow instructions from your health care provider about eating or drinking restrictions.  Ask your health care provider about: ? Changing or stopping your regular medicines. This is especially important if you are taking diabetes medicines or blood thinners. ? Taking medicines such as aspirin and ibuprofen. These medicines can thin your blood. Do not take these medicines before your procedure if your health care provider instructs you not to.  Plan to have someone take you home from the hospital or clinic.  If you will be going home right after the procedure, plan to have someone with you for 24 hours. What happens during the procedure?  To lower your risk of infection: ? Your health care team will wash or sanitize their hands. ? Your skin will be washed with soap. ? Hair may be removed from the incision area.  An IV tube will be inserted into one of your veins.  You will be given a medicine to help you relax (sedative).  The skin on your neck or groin will be numbed.  An incision will be made in your neck or your groin.  A needle will be inserted through the incision and into a large vein in your neck or groin.  A catheter will be inserted into the needle and moved to your heart.  Dye may be injected through the catheter to help your surgeon see the area of the heart that needs treatment.  Electrical currents will be sent from the catheter to  ablate heart tissue in desired areas. There are three types of energy that may be used to ablate heart tissue: ? Heat (radiofrequency energy). ? Laser energy. ? Extreme cold (cryoablation).  When the necessary tissue has been ablated, the catheter will be removed.  Pressure will be held on the catheter insertion area to prevent excessive bleeding.  A bandage (dressing) will be placed over the catheter insertion area. The procedure may vary among health care providers and hospitals. What happens after the procedure?  Your blood pressure, heart rate, breathing rate, and blood oxygen level will be monitored until the medicines you were given have worn off.  Your catheter insertion area will be monitored for bleeding. You will need to lie still for a few hours to ensure that you do not bleed from the catheter insertion area.  Do not drive for 24 hours or as long as directed by your health care provider. Summary  Cardiac ablation is a procedure to disable (ablate) a small amount of heart tissue in very specific places. Ablating some of the problem areas can improve the heart rhythm or return it to normal.  During the  procedure, electrical currents will be sent from the catheter to ablate heart tissue in desired areas. This information is not intended to replace advice given to you by your health care provider. Make sure you discuss any questions you have with your health care provider. Document Released: 07/12/2008 Document Revised: 01/13/2016 Document Reviewed: 01/13/2016 Elsevier Interactive Patient Education  [employer].

## 2017-06-14 NOTE — H&P (View-Only) (Signed)
 PCP: Hawkins, Edward, MD Primary Cardiologist: Dr McDowell Primary EP: Dr Noris Kulinski  Diane Cohen is a 72 y.o. female who presents today for routine electrophysiology followup.  Since last being seen in our clinic, the patient reports doing reasonably well.  She did not have her afib ablation due to illness of her husband (he has Lou gehrig's disease).  She remains in afib.  + chronic edema.  Today, she denies symptoms of palpitations, chest pain, shortness of breath, dizziness, presyncope, or syncope.  The patient is otherwise without complaint today.   Past Medical History:  Diagnosis Date  . Anxiety   . Atrial fibrillation ([facility])   . Chronic diastolic heart failure ([facility])   . Depression   . Essential hypertension   . Hyperlipidemia   . Hypothyroidism   . Pneumonia   . TIA (transient ischemic attack)    Past Surgical History:  Procedure Laterality Date  . ABDOMINAL HYSTERECTOMY    . BREAST SURGERY     biopsy  . CARDIOVERSION N/A 01/23/2016   Procedure: CARDIOVERSION;  Surgeon: Samuel G McDowell, MD;  [facility];  Service: Cardiovascular;  Laterality: N/A;  . CARDIOVERSION N/A 11/13/2016   Procedure: CARDIOVERSION;  Surgeon: Branch, Jonathan F, MD;  [facility];  Service: Endoscopy;  Laterality: N/A;  . CHOLECYSTECTOMY    . coloscopy      ROS- all systems are reviewed and negatives except as per HPI above  Current Outpatient Medications  Medication Sig Dispense Refill  . acetaminophen (TYLENOL) 650 MG CR tablet Take 1,300 mg every 8 (eight) hours as needed by mouth for pain.    . albuterol (PROVENTIL HFA;VENTOLIN HFA) 108 (90 BASE) MCG/ACT inhaler Inhale 2 puffs every 6 (six) hours as needed into the lungs for wheezing or shortness of breath.     . calcium carbonate (TUMS - DOSED IN MG ELEMENTAL CALCIUM) 500 MG chewable tablet Chew 4 tablets 2 (two) times daily as needed by mouth for indigestion or heartburn.    . Calcium Carbonate-Vitamin D (CALCIUM  600+D PO) Take 1 tablet 2 (two) times daily by mouth.    . Cyanocobalamin (B-12 COMPLIANCE INJECTION) 1000 MCG/ML KIT Inject 1 mL as directed every 14 (fourteen) days.     . diltiazem (CARDIZEM CD) 240 MG 24 hr capsule Take 1 capsule (240 mg total) by mouth daily. 90 capsule 3  . DULoxetine (CYMBALTA) 60 MG capsule Take 60 mg by mouth daily.    . ferrous sulfate 325 (65 FE) MG tablet Take 325 mg by mouth daily with breakfast.    . levothyroxine (SYNTHROID, LEVOTHROID) 137 MCG tablet Take 137 mcg by mouth daily before breakfast.     . lisinopril (PRINIVIL,ZESTRIL) 20 MG tablet Take 1 tablet (20 mg total) by mouth daily.    . magnesium oxide (MAG-OX) 400 MG tablet Take 400 mg by mouth daily.    . metoprolol succinate (TOPROL-XL) 100 MG 24 hr tablet Take 100 mg by mouth daily. Take with or immediately following a meal.    . Multiple Vitamins-Minerals (CENTRUM ADULTS PO) Take 1 capsule daily by mouth.    . omeprazole (PRILOSEC) 40 MG capsule Take 40 mg daily by mouth.     . potassium chloride SA (K-DUR,KLOR-CON) 20 MEQ tablet Take 2 tablets (40 mEq total) by mouth daily. 180 tablet 3  . rizatriptan (MAXALT-MLT) 10 MG disintegrating tablet Take 10 mg by mouth as needed for migraine. Reported on 04/30/2015    . torsemide (DEMADEX) 20   MG tablet TAKE 2 TABLETS(40 MG) BY MOUTH EVERY MORNING 60 tablet 6  . XARELTO 20 MG TABS tablet TAKE 1 TABLET BY MOUTH EVERY DAY WITH DINNER 30 tablet 0  . estradiol (ESTRACE) 1 MG tablet TAKE 1 TABLET BY MOUTH EVERY NIGHT AT BEDTIME (Patient not taking: Reported on 06/14/2017) 90 tablet 3   No current facility-administered medications for this visit.     Physical Exam: Vitals:   06/14/17 1225  BP: (!) 152/80  Pulse: (!) 114  SpO2: 95%  Weight: 189 lb (85.7 kg)  Height: 5' 1" (1.549 m)    GEN- The patient is well appearing, alert and oriented x 3 today.   Head- normocephalic, atraumatic Eyes-  Sclera clear, conjunctiva pink Ears- hearing intact Oropharynx-  clear Lungs- Clear to ausculation bilaterally, normal work of breathing Heart- irregular rate and rhythm, no murmurs, rubs or gallops, PMI not laterally displaced GI- soft, NT, ND, + BS Extremities- no clubbing, cyanosis, +2 edema with venous insufficiency  EKG tracing ordered today is personally reviewed and shows afib, V rate 104 bpm  Assessment and Plan:  1. Persistent afib She has failed medical therapy with amiodarone. Therapeutic strategies for afib including medicine and ablation were discussed in detail with the patient today. Risk, benefits, and alternatives to EP study and radiofrequency ablation for afib were also discussed in detail today. These risks include but are not limited to stroke, bleeding, vascular damage, tamponade, perforation, damage to the esophagus, lungs, and other structures, pulmonary vein stenosis, worsening renal function, and death. The patient understands these risk and wishes to proceed.  We will therefore proceed with catheter ablation at the next available time.  Will plan TEE prior to ablation. Importance of compliance with medicines discussed with the patient.  2. Snoring Sleep study is ordered.  Compliance with testing again discussed today.  3. Overweight Body mass index is 35.71 kg/m.  4. venous insufficiency May benefit from vascular referral after recovered from ablation  Jahsir Rama MD, FACC 06/14/2017 12:47 PM    

## 2017-06-14 NOTE — Progress Notes (Signed)
PCP: Sinda Du, MD Primary Cardiologist: Dr Domenic Polite Primary EP: Dr Dionne Bucy is a 72 y.o. female who presents today for routine electrophysiology followup.  Since last being seen in our clinic, the patient reports doing reasonably well.  She did not have her afib ablation due to illness of her husband (he has [place] Cerise gehrig's disease).  She remains in afib.  + chronic edema.  Today, she denies symptoms of palpitations, chest pain, shortness of breath, dizziness, presyncope, or syncope.  The patient is otherwise without complaint today.   Past Medical History:  Diagnosis Date  . Anxiety   . Atrial fibrillation ([place])   . Chronic diastolic heart failure ([place])   . Depression   . Essential hypertension   . Hyperlipidemia   . Hypothyroidism   . Pneumonia   . TIA (transient ischemic attack)    Past Surgical History:  Procedure Laterality Date  . ABDOMINAL HYSTERECTOMY    . BREAST SURGERY     biopsy  . CARDIOVERSION N/A 01/23/2016   Procedure: CARDIOVERSION;  Surgeon: Satira Sark, MD;  [facility];  Service: Cardiovascular;  Laterality: N/A;  . CARDIOVERSION N/A 11/13/2016   Procedure: CARDIOVERSION;  Surgeon: Arnoldo Lenis, MD;  Location: [facility];  Service: Endoscopy;  Laterality: N/A;  . CHOLECYSTECTOMY    . coloscopy      ROS- all systems are reviewed and negatives except as per HPI above  Current Outpatient Medications  Medication Sig Dispense Refill  . acetaminophen (TYLENOL) 650 MG CR tablet Take 1,300 mg every 8 (eight) hours as needed by mouth for pain.    Marland Kitchen albuterol (PROVENTIL HFA;VENTOLIN HFA) 108 (90 BASE) MCG/ACT inhaler Inhale 2 puffs every 6 (six) hours as needed into the lungs for wheezing or shortness of breath.     . calcium carbonate (TUMS - DOSED IN MG ELEMENTAL CALCIUM) 500 MG chewable tablet Chew 4 tablets 2 (two) times daily as needed by mouth for indigestion or heartburn.    . Calcium Carbonate-Vitamin D (CALCIUM  600+D PO) Take 1 tablet 2 (two) times daily by mouth.    . Cyanocobalamin (B-12 COMPLIANCE INJECTION) 1000 MCG/ML KIT Inject 1 mL as directed every 14 (fourteen) days.     Marland Kitchen diltiazem (CARDIZEM CD) 240 MG 24 hr capsule Take 1 capsule (240 mg total) by mouth daily. 90 capsule 3  . DULoxetine (CYMBALTA) 60 MG capsule Take 60 mg by mouth daily.    . ferrous sulfate 325 (65 FE) MG tablet Take 325 mg by mouth daily with breakfast.    . levothyroxine (SYNTHROID, LEVOTHROID) 137 MCG tablet Take 137 mcg by mouth daily before breakfast.     . lisinopril (PRINIVIL,ZESTRIL) 20 MG tablet Take 1 tablet (20 mg total) by mouth daily.    . magnesium oxide (MAG-OX) 400 MG tablet Take 400 mg by mouth daily.    . metoprolol succinate (TOPROL-XL) 100 MG 24 hr tablet Take 100 mg by mouth daily. Take with or immediately following a meal.    . Multiple Vitamins-Minerals (CENTRUM ADULTS PO) Take 1 capsule daily by mouth.    Marland Kitchen omeprazole (PRILOSEC) 40 MG capsule Take 40 mg daily by mouth.     . potassium chloride SA (K-DUR,KLOR-CON) 20 MEQ tablet Take 2 tablets (40 mEq total) by mouth daily. 180 tablet 3  . rizatriptan (MAXALT-MLT) 10 MG disintegrating tablet Take 10 mg by mouth as needed for migraine. Reported on 04/30/2015    . torsemide (DEMADEX) 20  MG tablet TAKE 2 TABLETS(40 MG) BY MOUTH EVERY MORNING 60 tablet 6  . XARELTO 20 MG TABS tablet TAKE 1 TABLET BY MOUTH EVERY DAY WITH DINNER 30 tablet 0  . estradiol (ESTRACE) 1 MG tablet TAKE 1 TABLET BY MOUTH EVERY NIGHT AT BEDTIME (Patient not taking: Reported on 06/14/2017) 90 tablet 3   No current facility-administered medications for this visit.     Physical Exam: Vitals:   06/14/17 1225  BP: (!) 152/80  Pulse: (!) 114  SpO2: 95%  Weight: 189 lb (85.7 kg)  Height: 5' 1" (1.549 m)    GEN- The patient is well appearing, alert and oriented x 3 today.   Head- normocephalic, atraumatic Eyes-  Sclera clear, conjunctiva pink Ears- hearing intact Oropharynx-  clear Lungs- Clear to ausculation bilaterally, normal work of breathing Heart- irregular rate and rhythm, no murmurs, rubs or gallops, PMI not laterally displaced GI- soft, NT, ND, + BS Extremities- no clubbing, cyanosis, +2 edema with venous insufficiency  EKG tracing ordered today is personally reviewed and shows afib, V rate 104 bpm  Assessment and Plan:  1. Persistent afib She has failed medical therapy with amiodarone. Therapeutic strategies for afib including medicine and ablation were discussed in detail with the patient today. Risk, benefits, and alternatives to EP study and radiofrequency ablation for afib were also discussed in detail today. These risks include but are not limited to stroke, bleeding, vascular damage, tamponade, perforation, damage to the esophagus, lungs, and other structures, pulmonary vein stenosis, worsening renal function, and death. The patient understands these risk and wishes to proceed.  We will therefore proceed with catheter ablation at the next available time.  Will plan TEE prior to ablation. Importance of compliance with medicines discussed with the patient.  2. Snoring Sleep study is ordered.  Compliance with testing again discussed today.  3. Overweight Body mass index is 35.71 kg/m.  4. venous insufficiency May benefit from vascular referral after recovered from ablation    MD, FACC 06/14/2017 12:47 PM    

## 2017-06-14 NOTE — Telephone Encounter (Signed)
Sent to precert 

## 2017-06-17 NOTE — Telephone Encounter (Signed)
Patient is scheduled for lab study on 06/23/17. Patient understands her sleep study will be done at [facility] sleep lab. Patient understands she will receive a sleep packet in a week or so. Patient understands to call if she does not receive the sleep packet in a timely manner. Patient agrees with treatment and thanked me for call.

## 2017-06-21 DIAGNOSIS — I482 Chronic atrial fibrillation: Secondary | ICD-10-CM | POA: Diagnosis not present

## 2017-06-21 DIAGNOSIS — I803 Phlebitis and thrombophlebitis of lower extremities, unspecified: Secondary | ICD-10-CM | POA: Diagnosis not present

## 2017-06-21 DIAGNOSIS — J449 Chronic obstructive pulmonary disease, unspecified: Secondary | ICD-10-CM | POA: Diagnosis not present

## 2017-06-21 DIAGNOSIS — I10 Essential (primary) hypertension: Secondary | ICD-10-CM | POA: Diagnosis not present

## 2017-07-06 ENCOUNTER — Encounter (HOSPITAL_COMMUNITY)
Admission: RE | Disposition: A | Discharge status: Home / Self Care | Payer: Self-pay | Source: Ambulatory Visit | Attending: Internal Medicine

## 2017-07-06 ENCOUNTER — Ambulatory Visit (HOSPITAL_COMMUNITY): Payer: Medicare PPO | Admitting: Certified Registered Nurse Anesthetist

## 2017-07-06 ENCOUNTER — Ambulatory Visit (HOSPITAL_COMMUNITY)
Admission: RE | Admit: 2017-07-06 | Discharge: 2017-07-07 | Disposition: A | Discharge status: Home / Self Care | Payer: Medicare PPO | Source: Ambulatory Visit | Attending: Internal Medicine | Admitting: Internal Medicine

## 2017-07-06 ENCOUNTER — Encounter (HOSPITAL_COMMUNITY): Payer: Self-pay | Admitting: *Deleted

## 2017-07-06 ENCOUNTER — Ambulatory Visit (HOSPITAL_COMMUNITY)
Admission: RE | Disposition: A | Discharge status: Home / Self Care | Payer: Medicare PPO | Source: Ambulatory Visit | Attending: Internal Medicine

## 2017-07-06 ENCOUNTER — Ambulatory Visit (HOSPITAL_BASED_OUTPATIENT_CLINIC_OR_DEPARTMENT_OTHER)
Admission: RE | Admit: 2017-07-06 | Discharge: 2017-07-06 | Disposition: A | Discharge status: Home / Self Care | Payer: Medicare PPO | Source: Ambulatory Visit | Attending: Cardiovascular Disease | Admitting: Cardiovascular Disease

## 2017-07-06 ENCOUNTER — Other Ambulatory Visit: Payer: Self-pay

## 2017-07-06 DIAGNOSIS — J449 Chronic obstructive pulmonary disease, unspecified: Secondary | ICD-10-CM | POA: Diagnosis not present

## 2017-07-06 DIAGNOSIS — Z7901 Long term (current) use of anticoagulants: Secondary | ICD-10-CM | POA: Diagnosis not present

## 2017-07-06 DIAGNOSIS — Z6835 Body mass index (BMI) 35.0-35.9, adult: Secondary | ICD-10-CM | POA: Diagnosis not present

## 2017-07-06 DIAGNOSIS — E663 Overweight: Secondary | ICD-10-CM | POA: Insufficient documentation

## 2017-07-06 DIAGNOSIS — I872 Venous insufficiency (chronic) (peripheral): Secondary | ICD-10-CM | POA: Insufficient documentation

## 2017-07-06 DIAGNOSIS — F329 Major depressive disorder, single episode, unspecified: Secondary | ICD-10-CM | POA: Diagnosis not present

## 2017-07-06 DIAGNOSIS — I340 Nonrheumatic mitral (valve) insufficiency: Secondary | ICD-10-CM | POA: Diagnosis not present

## 2017-07-06 DIAGNOSIS — I4891 Unspecified atrial fibrillation: Secondary | ICD-10-CM | POA: Diagnosis not present

## 2017-07-06 DIAGNOSIS — I5032 Chronic diastolic (congestive) heart failure: Secondary | ICD-10-CM | POA: Diagnosis not present

## 2017-07-06 DIAGNOSIS — I10 Essential (primary) hypertension: Secondary | ICD-10-CM | POA: Diagnosis not present

## 2017-07-06 DIAGNOSIS — E785 Hyperlipidemia, unspecified: Secondary | ICD-10-CM | POA: Insufficient documentation

## 2017-07-06 DIAGNOSIS — F419 Anxiety disorder, unspecified: Secondary | ICD-10-CM | POA: Diagnosis not present

## 2017-07-06 DIAGNOSIS — I4819 Other persistent atrial fibrillation: Secondary | ICD-10-CM | POA: Diagnosis present

## 2017-07-06 DIAGNOSIS — I110 Hypertensive heart disease with heart failure: Secondary | ICD-10-CM | POA: Diagnosis not present

## 2017-07-06 DIAGNOSIS — E039 Hypothyroidism, unspecified: Secondary | ICD-10-CM | POA: Diagnosis not present

## 2017-07-06 DIAGNOSIS — I481 Persistent atrial fibrillation: Secondary | ICD-10-CM | POA: Insufficient documentation

## 2017-07-06 DIAGNOSIS — I480 Paroxysmal atrial fibrillation: Secondary | ICD-10-CM | POA: Diagnosis present

## 2017-07-06 DIAGNOSIS — Z8673 Personal history of transient ischemic attack (TIA), and cerebral infarction without residual deficits: Secondary | ICD-10-CM | POA: Insufficient documentation

## 2017-07-06 DIAGNOSIS — D649 Anemia, unspecified: Secondary | ICD-10-CM | POA: Diagnosis not present

## 2017-07-06 HISTORY — PX: ABLATION OF DYSRHYTHMIC FOCUS: SHX254

## 2017-07-06 HISTORY — PX: ATRIAL FIBRILLATION ABLATION: EP1191

## 2017-07-06 HISTORY — PX: TEE WITHOUT CARDIOVERSION: SHX5443

## 2017-07-06 LAB — CBC WITH DIFFERENTIAL/PLATELET
BASOS PCT: 0 %
Basophils Absolute: 0 10*3/uL (ref 0.0–0.1)
EOS ABS: 0.1 10*3/uL (ref 0.0–0.7)
EOS PCT: 2 %
HCT: 41.6 % (ref 36.0–46.0)
Hemoglobin: 13.4 g/dL (ref 12.0–15.0)
LYMPHS ABS: 1.4 10*3/uL (ref 0.7–4.0)
Lymphocytes Relative: 24 %
MCH: 32.8 pg (ref 26.0–34.0)
MCHC: 32.2 g/dL (ref 30.0–36.0)
MCV: 101.7 fL — ABNORMAL HIGH (ref 78.0–100.0)
Monocytes Absolute: 0.5 10*3/uL (ref 0.1–1.0)
Monocytes Relative: 8 %
NEUTROS PCT: 66 %
Neutro Abs: 4 10*3/uL (ref 1.7–7.7)
PLATELETS: 256 10*3/uL (ref 150–400)
RBC: 4.09 MIL/uL (ref 3.87–5.11)
RDW: 15.1 % (ref 11.5–15.5)
WBC: 5.9 10*3/uL (ref 4.0–10.5)

## 2017-07-06 LAB — POCT ACTIVATED CLOTTING TIME
ACTIVATED CLOTTING TIME: 268 s
ACTIVATED CLOTTING TIME: 197 s
Activated Clotting Time: 307 seconds
Activated Clotting Time: 312 seconds
Activated Clotting Time: 323 seconds
Activated Clotting Time: 197 seconds

## 2017-07-06 LAB — BASIC METABOLIC PANEL
Anion gap: 8 (ref 5–15)
BUN: 16 mg/dL (ref 6–20)
CO2: 28 mmol/L (ref 22–32)
CREATININE: 1.13 mg/dL — AB (ref 0.44–1.00)
Calcium: 9.8 mg/dL (ref 8.9–10.3)
Chloride: 102 mmol/L (ref 101–111)
GFR, EST AFRICAN AMERICAN: 55 mL/min — AB (ref >60)
GFR, EST NON AFRICAN AMERICAN: 48 mL/min — AB (ref >60)
Glucose, Bld: 100 mg/dL — ABNORMAL HIGH (ref 65–99)
POTASSIUM: 3.7 mmol/L (ref 3.5–5.1)
SODIUM: 138 mmol/L (ref 135–145)

## 2017-07-06 SURGERY — ECHOCARDIOGRAM, TRANSESOPHAGEAL
Anesthesia: Moderate Sedation

## 2017-07-06 SURGERY — ATRIAL FIBRILLATION ABLATION
Anesthesia: General

## 2017-07-06 MED ORDER — SODIUM CHLORIDE 0.9% FLUSH
3.0000 mL | Freq: Two times a day (BID) | INTRAVENOUS | Status: DC
  Administered 2017-07-06 – 2017-07-07 (×2): 3 mL via INTRAVENOUS

## 2017-07-06 MED ORDER — SODIUM CHLORIDE 0.9% FLUSH: 3.0000 mL | INTRAVENOUS | Status: DC | PRN

## 2017-07-06 MED ORDER — BUTAMBEN-TETRACAINE-BENZOCAINE 2-2-14 % EX AERO
INHALATION_SPRAY | CUTANEOUS | Status: DC | PRN
  Administered 2017-07-06: 2 via TOPICAL

## 2017-07-06 MED ORDER — PROPOFOL 10 MG/ML IV BOLUS
INTRAVENOUS | Status: DC | PRN
  Administered 2017-07-06: 100 mg via INTRAVENOUS
  Administered 2017-07-06: 40 mg via INTRAVENOUS

## 2017-07-06 MED ORDER — PROTAMINE SULFATE 10 MG/ML IV SOLN
INTRAVENOUS | Status: DC | PRN
  Administered 2017-07-06: 10 mg via INTRAVENOUS
  Administered 2017-07-06: 20 mg via INTRAVENOUS

## 2017-07-06 MED ORDER — POTASSIUM CHLORIDE CRYS ER 20 MEQ PO TBCR
40.0000 meq | EXTENDED_RELEASE_TABLET | Freq: Every day | ORAL | Status: DC
  Administered 2017-07-07: 09:00:00 40 meq via ORAL
  Filled 2017-07-06: qty 2

## 2017-07-06 MED ORDER — SUGAMMADEX SODIUM 200 MG/2ML IV SOLN
INTRAVENOUS | Status: DC | PRN
  Administered 2017-07-06: 200 mg via INTRAVENOUS

## 2017-07-06 MED ORDER — ROCURONIUM BROMIDE 100 MG/10ML IV SOLN
INTRAVENOUS | Status: DC | PRN
  Administered 2017-07-06: 50 mg via INTRAVENOUS

## 2017-07-06 MED ORDER — HEPARIN SODIUM (PORCINE) 1000 UNIT/ML IJ SOLN
INTRAMUSCULAR | Status: DC | PRN
  Administered 2017-07-06: 4000 [IU] via INTRAVENOUS
  Administered 2017-07-06 (×2): 2000 [IU] via INTRAVENOUS

## 2017-07-06 MED ORDER — MIDAZOLAM HCL 5 MG/ML IJ SOLN
INTRAMUSCULAR | Status: AC
  Filled 2017-07-06: qty 2

## 2017-07-06 MED ORDER — METOPROLOL SUCCINATE ER 100 MG PO TB24
100.0000 mg | ORAL_TABLET | Freq: Every day | ORAL | Status: DC
  Administered 2017-07-07: 100 mg via ORAL
  Filled 2017-07-06: qty 1

## 2017-07-06 MED ORDER — HEPARIN (PORCINE) IN NACL 2-0.9 UNITS/ML
INTRAMUSCULAR | Status: DC | PRN
  Administered 2017-07-06: 500 mL

## 2017-07-06 MED ORDER — ONDANSETRON HCL 4 MG/2ML IJ SOLN: 4.0000 mg | Freq: Four times a day (QID) | INTRAMUSCULAR | Status: DC | PRN

## 2017-07-06 MED ORDER — MIDAZOLAM HCL 5 MG/5ML IJ SOLN
INTRAMUSCULAR | Status: DC | PRN
  Administered 2017-07-06: 2 mg via INTRAVENOUS
  Administered 2017-07-06: 1 mg via INTRAVENOUS
  Administered 2017-07-06: 2 mg via INTRAVENOUS

## 2017-07-06 MED ORDER — DULOXETINE HCL 60 MG PO CPEP
60.0000 mg | ORAL_CAPSULE | Freq: Every day | ORAL | Status: DC
  Administered 2017-07-07: 60 mg via ORAL
  Filled 2017-07-06: qty 1

## 2017-07-06 MED ORDER — ACETAMINOPHEN 325 MG PO TABS: 650.0000 mg | ORAL_TABLET | ORAL | Status: DC | PRN

## 2017-07-06 MED ORDER — IOPAMIDOL (ISOVUE-370) INJECTION 76%
INTRAVENOUS | Status: AC
  Filled 2017-07-06: qty 50

## 2017-07-06 MED ORDER — HEPARIN SODIUM (PORCINE) 1000 UNIT/ML IJ SOLN
INTRAMUSCULAR | Status: AC
  Filled 2017-07-06: qty 1

## 2017-07-06 MED ORDER — HYDROCODONE-ACETAMINOPHEN 5-325 MG PO TABS: 1.0000 | ORAL_TABLET | ORAL | Status: DC | PRN

## 2017-07-06 MED ORDER — DEXAMETHASONE SODIUM PHOSPHATE 10 MG/ML IJ SOLN
INTRAMUSCULAR | Status: DC | PRN
  Administered 2017-07-06: 10 mg via INTRAVENOUS

## 2017-07-06 MED ORDER — PHENYLEPHRINE 40 MCG/ML (10ML) SYRINGE FOR IV PUSH (FOR BLOOD PRESSURE SUPPORT)
PREFILLED_SYRINGE | INTRAVENOUS | Status: DC | PRN
  Administered 2017-07-06: 80 ug via INTRAVENOUS
  Administered 2017-07-06: 120 ug via INTRAVENOUS

## 2017-07-06 MED ORDER — LEVOTHYROXINE SODIUM 137 MCG PO TABS
137.0000 ug | ORAL_TABLET | Freq: Every day | ORAL | Status: DC
  Filled 2017-07-06: qty 1

## 2017-07-06 MED ORDER — SODIUM CHLORIDE 0.9 % IV SOLN: INTRAVENOUS | Status: DC

## 2017-07-06 MED ORDER — FENTANYL CITRATE (PF) 100 MCG/2ML IJ SOLN
INTRAMUSCULAR | Status: DC | PRN
  Administered 2017-07-06 (×2): 50 ug via INTRAVENOUS

## 2017-07-06 MED ORDER — BUPIVACAINE HCL (PF) 0.25 % IJ SOLN
INTRAMUSCULAR | Status: AC
  Filled 2017-07-06: qty 30

## 2017-07-06 MED ORDER — RIVAROXABAN 20 MG PO TABS
20.0000 mg | ORAL_TABLET | Freq: Every day | ORAL | Status: DC
  Administered 2017-07-06: 20:00:00 20 mg via ORAL
  Filled 2017-07-06: qty 1

## 2017-07-06 MED ORDER — FENTANYL CITRATE (PF) 100 MCG/2ML IJ SOLN
INTRAMUSCULAR | Status: AC
  Filled 2017-07-06: qty 2

## 2017-07-06 MED ORDER — SODIUM CHLORIDE 0.9 % IV SOLN: 250.0000 mL | INTRAVENOUS | Status: DC | PRN

## 2017-07-06 MED ORDER — TORSEMIDE 20 MG PO TABS
40.0000 mg | ORAL_TABLET | Freq: Every day | ORAL | Status: DC
  Administered 2017-07-07: 40 mg via ORAL
  Filled 2017-07-06: qty 2

## 2017-07-06 MED ORDER — LISINOPRIL 20 MG PO TABS
20.0000 mg | ORAL_TABLET | Freq: Every day | ORAL | Status: DC
  Administered 2017-07-07: 09:00:00 20 mg via ORAL
  Filled 2017-07-06: qty 2
  Filled 2017-07-06: qty 1

## 2017-07-06 MED ORDER — FENTANYL CITRATE (PF) 100 MCG/2ML IJ SOLN
INTRAMUSCULAR | Status: DC | PRN
  Administered 2017-07-06 (×2): 25 ug via INTRAVENOUS

## 2017-07-06 MED ORDER — SODIUM CHLORIDE 0.9 % IV SOLN
INTRAVENOUS | Status: DC | PRN
  Administered 2017-07-06 (×2): via INTRAVENOUS

## 2017-07-06 MED ORDER — DILTIAZEM HCL ER COATED BEADS 240 MG PO CP24
240.0000 mg | ORAL_CAPSULE | Freq: Every day | ORAL | Status: DC
  Administered 2017-07-07: 09:00:00 240 mg via ORAL
  Filled 2017-07-06: qty 1

## 2017-07-06 MED ORDER — ONDANSETRON HCL 4 MG/2ML IJ SOLN
INTRAMUSCULAR | Status: DC | PRN
  Administered 2017-07-06: 4 mg via INTRAVENOUS

## 2017-07-06 MED ORDER — HEPARIN SODIUM (PORCINE) 1000 UNIT/ML IJ SOLN
INTRAMUSCULAR | Status: DC | PRN
  Administered 2017-07-06: 1000 [IU] via INTRAVENOUS
  Administered 2017-07-06: 12000 [IU] via INTRAVENOUS

## 2017-07-06 MED ORDER — PHENYLEPHRINE HCL 10 MG/ML IJ SOLN
INTRAVENOUS | Status: DC | PRN
  Administered 2017-07-06: 25 ug/min via INTRAVENOUS

## 2017-07-06 MED ORDER — IOPAMIDOL (ISOVUE-370) INJECTION 76%
INTRAVENOUS | Status: DC | PRN
  Administered 2017-07-06: 3 mL via INTRAVENOUS

## 2017-07-06 SURGICAL SUPPLY — 20 items
BLANKET WARM UNDERBOD FULL ACC (MISCELLANEOUS) ×3 IMPLANT
CATH MAPPNG PENTARAY F 2-6-2MM (CATHETERS) IMPLANT
CATH NAVISTAR SMARTTOUCH DF (ABLATOR) ×2 IMPLANT
CATH SOUNDSTAR 3D IMAGING (CATHETERS) ×2 IMPLANT
CATH WEBSTER BI DIR CS D-F CRV (CATHETERS) ×2 IMPLANT
COVER SWIFTLINK CONNECTOR (BAG) ×3 IMPLANT
NDL BAYLIS TRANSSEPTAL 71CM (NEEDLE) IMPLANT
NDL TRANSEP BRK 71CM 407200 (NEEDLE) IMPLANT
NEEDLE BAYLIS TRANSSEPTAL 71CM (NEEDLE) ×3 IMPLANT
NEEDLE TRANSEP BRK 71CM 407200 (NEEDLE) ×3 IMPLANT
PACK EP LATEX FREE (CUSTOM PROCEDURE TRAY) ×3
PACK EP LF (CUSTOM PROCEDURE TRAY) ×1 IMPLANT
PAD DEFIB LIFELINK (PAD) ×3 IMPLANT
PATCH CARTO3 (PAD) ×2 IMPLANT
PENTARAY F 2-6-2MM (CATHETERS) ×3
SHEATH AVANTI 11CM 7FR (SHEATH) ×4 IMPLANT
SHEATH AVANTI 11CM 9FR (SHEATH) ×2 IMPLANT
SHEATH AVANTI 11F 11CM (SHEATH) ×2 IMPLANT
SHEATH SWARTZ TS SL2 63CM 8.5F (SHEATH) ×2 IMPLANT
TUBING SMART ABLATE COOLFLOW (TUBING) ×2 IMPLANT

## 2017-07-06 NOTE — Interval H&P Note (Signed)
History and Physical Interval Note:  07/06/2017 8:42 AM  Diane Cohen  has presented today for surgery, with the diagnosis of AFIB  The various methods of treatment have been discussed with the patient and family. After consideration of risks, benefits and other options for treatment, the patient has consented to  Procedure(s): TRANSESOPHAGEAL ECHOCARDIOGRAM (TEE) (N/A) as a surgical intervention .  The patient's history has been reviewed, patient examined, no change in status, stable for surgery.  I have reviewed the patient's chart and labs.  Questions were answered to the patient's satisfaction.     Jenkins Rouge

## 2017-07-06 NOTE — Transfer of Care (Signed)
Immediate Anesthesia Transfer of Care Note  Patient: Diane Cohen  Procedure(s) Performed: ATRIAL FIBRILLATION ABLATION (N/A )  Patient Location: Cath Lab  Anesthesia Type:General  Level of Consciousness: drowsy  Airway & Oxygen Therapy: Patient Spontanous Breathing and Patient connected to face mask oxygen  Post-op Assessment: Report given to RN, Post -op Vital signs reviewed and stable and Patient moving all extremities X 4  Post vital signs: Reviewed and stable  Last Vitals:  Vitals Value Taken Time  BP 131/67 07/06/2017  2:23 PM  Temp 36.4 C 07/06/2017  2:22 PM  Pulse 94 07/06/2017  2:24 PM  Resp 21 07/06/2017  2:24 PM  SpO2 97 % 07/06/2017  2:24 PM  Vitals shown include unvalidated device data.  Last Pain:  Vitals:   07/06/17 1422  TempSrc: Temporal  PainSc: 0-No pain         Complications: No apparent anesthesia complications

## 2017-07-06 NOTE — Progress Notes (Signed)
Site area: Right groin a 7, 9, 11 french venous sheath was removed  Site Prior to Removal:  Level 0  Pressure Applied For 20 MINUTES    Bedrest Beginning at 1530p  Manual:   Yes.    Patient Status During Pull:  stable  Post Pull Groin Site:  Level 0  Post Pull Instructions Given:  Yes.    Post Pull Pulses Present:  Yes.    Dressing Applied:  Yes.    Comments:  VS remain stable during sheath pull.

## 2017-07-06 NOTE — Interval H&P Note (Signed)
History and Physical Interval Note:  07/06/2017 10:03 AM  Diane Cohen  has presented today for surgery, with the diagnosis of afib  The various methods of treatment have been discussed with the patient and family. After consideration of risks, benefits and other options for treatment, the patient has consented to  Procedure(s): ATRIAL FIBRILLATION ABLATION (N/A) as a surgical intervention .  The patient's history has been reviewed, patient examined, no change in status, stable for surgery.  I have reviewed the patient's chart and labs.  Questions were answered to the patient's satisfaction.    Reports compliance with xarelto without interruption.  TEE reviewed. Thompson Grayer

## 2017-07-06 NOTE — Anesthesia Preprocedure Evaluation (Addendum)
Anesthesia Evaluation  Patient identified by MRN, date of birth, ID band Patient awake    Reviewed: Allergy & Precautions, NPO status , Patient's Chart, lab work & pertinent test results  Airway Mallampati: II  TM Distance: >3 FB Neck ROM: Full    Dental no notable dental hx.    Pulmonary COPD, former smoker,    Pulmonary exam normal breath sounds clear to auscultation       Cardiovascular Exercise Tolerance: Good hypertension, negative cardio ROS Normal cardiovascular exam Rhythm:Regular Rate:Normal     Neuro/Psych negative neurological ROS     GI/Hepatic negative GI ROS, Neg liver ROS,   Endo/Other    Renal/GU negative Renal ROS     Musculoskeletal   Abdominal   Peds negative pediatric ROS (+)  Hematology  (+) anemia ,   Anesthesia Other Findings   Reproductive/Obstetrics                            Lab Results  Component Value Date   WBC 5.9 07/06/2017   HGB 13.4 07/06/2017   HCT 41.6 07/06/2017   MCV 101.7 (H) 07/06/2017   PLT 256 07/06/2017    Anesthesia Physical Anesthesia Plan  ASA: III  Anesthesia Plan: General   Post-op Pain Management:    Induction: Intravenous  PONV Risk Score and Plan: Treatment may vary due to age or medical condition and Ondansetron  Airway Management Planned: Oral ETT  Additional Equipment:   Intra-op Plan:   Post-operative Plan: Extubation in OR  Informed Consent: I have reviewed the patients History and Physical, chart, labs and discussed the procedure including the risks, benefits and alternatives for the proposed anesthesia with the patient or authorized representative who has indicated his/her understanding and acceptance.   Dental advisory given  Plan Discussed with: CRNA  Anesthesia Plan Comments:         Anesthesia Quick Evaluation

## 2017-07-06 NOTE — Discharge Summary (Addendum)
ELECTROPHYSIOLOGY PROCEDURE DISCHARGE SUMMARY    Patient ID: Diane Cohen,  MRN: [id number], DOB/AGE: [birth year] 72 y.o.  Admit date: 07/06/2017 Discharge date: 07/07/2017  Primary Care Physician: Sinda Du, MD Primary Cardiologist: Domenic Polite Electrophysiologist: Thompson Grayer, MD  Primary Discharge Diagnosis:  Persistent atrial fibrillation status post ablation this admission  Secondary Discharge Diagnosis:  1.  Depression 2.  HTN 3.  Prior TIA 4.  Hypothyroidism  Procedures This Admission:  1.  Electrophysiology study and radiofrequency catheter ablation on 07/06/17 by Dr Thompson Grayer.  This study demonstrated atrial fibrillation upon presentation; intracardiac echo reveals a mildly enlarged left atrium with four separate pulmonary veins without evidence of pulmonary vein stenosis; successful electrical isolation and anatomical encircling of all four pulmonary veins with radiofrequency current.  A WACA approach was used; additional left atrial ablation was performed with a standard box lesion created along the posterior wall of the left atrium; atrial fibrillation successfully cardioverted to sinus rhythm; no early apparent complications..    Brief HPI: Diane Cohen is a 72 y.o. female with a history of persistent atrial fibrillation.  They have failed medical therapy with amiodarone. Risks, benefits, and alternatives to catheter ablation of atrial fibrillation were reviewed with the patient who wished to proceed.  The patient underwent TEE prior to the procedure which demonstrated normal LV function and no LAA thrombus.    Hospital Course:  The patient was admitted and underwent EPS/RFCA of atrial fibrillation with details as outlined above.  They were monitored on telemetry overnight which demonstrated SR with short run of AF.  Groin was without complication on the day of discharge.  The patient was examined and considered to be stable for discharge.  Wound care and  restrictions were reviewed with the patient.  The patient will be seen back by Roderic Palau, NP in 4 weeks and Dr Rayann Heman in 12 weeks for post ablation follow up.   This patients CHA2DS2-VASc Score and unadjusted Ischemic Stroke Rate (% per year) is equal to 7.2 % stroke rate/year from a score of 5 Above score calculated as 1 point each if present [CHF, HTN, DM, Vascular=MI/PAD/Aortic Plaque, Age if 65-74, or Female] Above score calculated as 2 points each if present [Age > 75, or Stroke/TIA/TE]   Physical Exam: Vitals:   07/06/17 1830 07/06/17 1900 07/06/17 2112 07/07/17 0409  BP: 128/63 121/62 (!) 126/59 (!) 101/44  Pulse: 86 84 86 82  Resp: 18 18 15 17   Temp:   97.7 F (36.5 C) 97.8 F (36.6 C)  TempSrc:   Oral Oral  SpO2: 94% 93% 93% 94%  Weight:    187 lb 2.7 oz (84.9 kg)  Height:        GEN- The patient is elderly appearing, alert and oriented x 3 today.   HEENT: normocephalic, atraumatic; sclera clear, conjunctiva pink; hearing intact; oropharynx clear; neck supple  Lungs- Clear to ausculation bilaterally, normal work of breathing.  No wheezes, rales, rhonchi Heart- Regular rate and rhythm  GI- soft, non-tender, non-distended, bowel sounds present  Extremities- no clubbing, cyanosis, or edema; DP/PT/radial pulses 2+ bilaterally, groin without hematoma/bruit MS- no significant deformity or atrophy Skin- warm and dry, no rash or lesion Psych- euthymic mood, full affect Neuro- strength and sensation are intact   Labs:   Lab Results  Component Value Date   WBC 5.9 07/06/2017   HGB 13.4 07/06/2017   HCT 41.6 07/06/2017   MCV 101.7 (H) 07/06/2017   PLT 256 07/06/2017  Recent Labs  Lab 07/06/17 0948  NA 138  K 3.7  CL 102  CO2 28  BUN 16  CREATININE 1.13*  CALCIUM 9.8  GLUCOSE 100*     Discharge Medications:  Allergies as of 07/07/2017      Reactions   Levaquin [levofloxacin] Other (See Comments)   Headache    Sulfa Antibiotics Hives      Medication  List    TAKE these medications   acetaminophen 650 MG CR tablet Commonly known as:  TYLENOL Take 1,300 mg every 8 (eight) hours as needed by mouth for pain.   albuterol 108 (90 Base) MCG/ACT inhaler Commonly known as:  PROVENTIL HFA;VENTOLIN HFA Inhale 2 puffs every 6 (six) hours as needed into the lungs for wheezing or shortness of breath.   CALCIUM 600+D PO Take 1 tablet by mouth daily.   calcium carbonate 500 MG chewable tablet Commonly known as:  TUMS - dosed in mg elemental calcium Chew 4 tablets 2 (two) times daily as needed by mouth for indigestion or heartburn.   diltiazem 240 MG 24 hr capsule Commonly known as:  CARDIZEM CD Take 1 capsule (240 mg total) by mouth daily.   DULoxetine 60 MG capsule Commonly known as:  CYMBALTA Take 60 mg by mouth daily.   estradiol 1 MG tablet Commonly known as:  ESTRACE TAKE 1 TABLET BY MOUTH EVERY NIGHT AT BEDTIME   ferrous sulfate 325 (65 FE) MG tablet Take 325 mg by mouth daily with breakfast.   levothyroxine 137 MCG tablet Commonly known as:  SYNTHROID, LEVOTHROID Take 137 mcg by mouth daily before breakfast.   lisinopril 20 MG tablet Commonly known as:  PRINIVIL,ZESTRIL Take 1 tablet (20 mg total) by mouth daily.   magnesium oxide 400 MG tablet Commonly known as:  MAG-OX Take 400 mg by mouth daily.   metoprolol succinate 100 MG 24 hr tablet Commonly known as:  TOPROL-XL Take 100 mg by mouth daily. Take with or immediately following a meal.   omeprazole 40 MG capsule Commonly known as:  PRILOSEC Take 40 mg daily by mouth.   potassium chloride SA 20 MEQ tablet Commonly known as:  K-DUR,KLOR-CON Take 2 tablets (40 mEq total) by mouth daily.   rizatriptan 10 MG disintegrating tablet Commonly known as:  MAXALT-MLT Take 10 mg by mouth as needed for migraine.   torsemide 20 MG tablet Commonly known as:  DEMADEX TAKE 2 TABLETS(40 MG) BY MOUTH EVERY MORNING   XARELTO 20 MG Tabs tablet Generic drug:   rivaroxaban TAKE 1 TABLET BY MOUTH EVERY DAY WITH DINNER       Disposition:  Discharge Instructions    Diet - low sodium heart healthy   Complete by:  As directed    Increase activity slowly   Complete by:  As directed      Follow-up Information    [facility] Follow up on 08/04/2017.   Specialty:  Cardiology Why:  at 11:30AM Contact information: [id number] [place] [address] [phone]       Thompson Grayer, MD Follow up on 10/06/2017.   Specialty:  Cardiology Why:  at 12:15PM  Contact information: [place] [address] [phone]           Duration of Discharge Encounter: Greater than 30 minutes including physician time.  Signed, Chanetta Marshall, NP 07/07/2017 7:16 AM  I have seen, examined the patient, and reviewed the above assessment and plan.  Changes to above are made where necessary.  On exam, RRR.  Doing well s/p ablation.  Routine post procedure management.  DC to home.  Co Sign: Thompson Grayer, MD 07/07/2017 11:23 AM

## 2017-07-06 NOTE — Anesthesia Procedure Notes (Signed)
Procedure Name: Intubation Date/Time: 07/06/2017 11:14 AM Performed by: Leonor Liv, CRNA Pre-anesthesia Checklist: Patient identified, Emergency Drugs available, Suction available and Patient being monitored Patient Re-evaluated:Patient Re-evaluated prior to induction Oxygen Delivery Method: Circle System Utilized Preoxygenation: Pre-oxygenation with 100% oxygen Induction Type: IV induction Ventilation: Mask ventilation without difficulty Laryngoscope Size: McGraph and 3 Grade View: Grade II Tube type: Oral Tube size: 7.0 mm Number of attempts: 1 Airway Equipment and Method: Stylet and Oral airway Placement Confirmation: ETT inserted through vocal cords under direct vision,  positive ETCO2 and breath sounds checked- equal and bilateral Secured at: 21 cm Tube secured with: Tape Dental Injury: Teeth and Oropharynx as per pre-operative assessment

## 2017-07-06 NOTE — CV Procedure (Signed)
TEE; During this procedure the patient is administered a total of Versed 5 mg and Fentanyl 25 mg to achieve and maintain moderate conscious sedation.  The patient's heart rate, blood pressure, and oxygen saturation are monitored continuously during the procedure. The period of conscious sedation is 30 minutes, of which I was present face-to-face 100% of this time.  3D Rendering of the mitral valve was performed  Mild MR Normal AV  LAE No LAA thrombus There was smoke and shadowing from the coumadin [place] so definity was given and filled the LAA with no evidence of thrombus Normal RV No ASD No aortic debris  Jenkins Rouge

## 2017-07-07 ENCOUNTER — Encounter (HOSPITAL_COMMUNITY): Payer: Self-pay | Admitting: Internal Medicine

## 2017-07-07 DIAGNOSIS — I5032 Chronic diastolic (congestive) heart failure: Secondary | ICD-10-CM | POA: Diagnosis not present

## 2017-07-07 DIAGNOSIS — E039 Hypothyroidism, unspecified: Secondary | ICD-10-CM | POA: Diagnosis not present

## 2017-07-07 DIAGNOSIS — I110 Hypertensive heart disease with heart failure: Secondary | ICD-10-CM | POA: Diagnosis not present

## 2017-07-07 DIAGNOSIS — I481 Persistent atrial fibrillation: Secondary | ICD-10-CM | POA: Diagnosis not present

## 2017-07-07 DIAGNOSIS — I872 Venous insufficiency (chronic) (peripheral): Secondary | ICD-10-CM | POA: Diagnosis not present

## 2017-07-07 DIAGNOSIS — F329 Major depressive disorder, single episode, unspecified: Secondary | ICD-10-CM | POA: Diagnosis not present

## 2017-07-07 DIAGNOSIS — E663 Overweight: Secondary | ICD-10-CM | POA: Diagnosis not present

## 2017-07-07 DIAGNOSIS — F419 Anxiety disorder, unspecified: Secondary | ICD-10-CM | POA: Diagnosis not present

## 2017-07-07 DIAGNOSIS — E785 Hyperlipidemia, unspecified: Secondary | ICD-10-CM | POA: Diagnosis not present

## 2017-07-07 MED ORDER — OFF THE BEAT BOOK
Freq: Once | Status: AC
  Administered 2017-07-07: 07:00:00
  Filled 2017-07-07: qty 1

## 2017-07-07 MED FILL — Bupivacaine Inj 0.25% w/ Epinephrine 1:200000 (PF): INTRAMUSCULAR | Qty: 30 | Status: AC

## 2017-07-07 NOTE — Discharge Instructions (Addendum)
No driving for 4 days. No lifting over 5 lbs for 1 week. No sexual activity for 1 week. You may return to work in 1 week. Keep procedure site clean & dry. If you notice increased pain, swelling, bleeding or pus, call/return!  You may shower, but no soaking baths/hot tubs/pools for 1 week.   You have an appointment set up with the [facility].  Multiple studies have shown that being followed by a dedicated atrial fibrillation clinic in addition to the standard care you receive from your other physicians improves health. We believe that enrollment in the atrial fibrillation clinic will allow [place] to better care for you.   The phone number to the [facility] is [phone]. The clinic is staffed Monday through Friday from 8:30am to 5pm.  Parking Directions: The clinic is located in the Heart and Vascular Building connected to [facility]. 1)From [address] turn on to [address] and go to the 3rd entrance  (Heart and Vascular entrance) on the right. 2)Look to the right for Heart &Vascular Parking Garage. 3)A code for the entrance is required please call the clinic to receive this.   4)Take the elevators to the 1st floor. Registration is in the room with the glass walls at the end of the hallway.  If you have any trouble parking or locating the clinic, please dont hesitate to call [phone].     Information on my medicine - XARELTO (Rivaroxaban)  Why was Xarelto prescribed for you? Xarelto was prescribed for you to reduce the risk of a blood clot forming that can cause a stroke if you have a medical condition called atrial fibrillation (a type of irregular heartbeat).  What do you need to know about xarelto ? Take your Xarelto ONCE DAILY at the same time every day with your evening meal. If you have difficulty swallowing the tablet whole, you may crush it and mix in applesauce just prior to taking your dose.  Take Xarelto exactly as  prescribed by your doctor and DO NOT stop taking Xarelto without talking to the doctor who prescribed the medication.  Stopping without other stroke prevention medication to take the place of Xarelto may increase your risk of developing a clot that causes a stroke.  Refill your prescription before you run out.  After discharge, you should have regular check-up appointments with your healthcare provider that is prescribing your Xarelto.  In the future your dose may need to be changed if your kidney function or weight changes by a significant amount.  What do you do if you miss a dose? If you are taking Xarelto ONCE DAILY and you miss a dose, take it as soon as you remember on the same day then continue your regularly scheduled once daily regimen the next day. Do not take two doses of Xarelto at the same time or on the same day.   Important Safety Information A possible side effect of Xarelto is bleeding. You should call your healthcare provider right away if you experience any of the following: ? Bleeding from an injury or your nose that does not stop. ? Unusual colored urine (red or dark brown) or unusual colored stools (red or black). ? Unusual bruising for unknown reasons. ? A serious fall or if you hit your head (even if there is no bleeding).  Some medicines may interact with Xarelto and might increase your risk of bleeding while on Xarelto. To help avoid this, consult your healthcare provider or pharmacist  prior to using any new prescription or non-prescription medications, including herbals, vitamins, non-steroidal anti-inflammatory drugs (NSAIDs) and supplements.  This website has more information on Xarelto: [email or web address]

## 2017-07-07 NOTE — Anesthesia Postprocedure Evaluation (Signed)
Anesthesia Post Note  Patient: Diane Cohen  Procedure(s) Performed: ATRIAL FIBRILLATION ABLATION (N/A )     Patient location during evaluation: PACU Anesthesia Type: General Level of consciousness: awake and alert Pain management: pain level controlled Vital Signs Assessment: post-procedure vital signs reviewed and stable Respiratory status: spontaneous breathing, nonlabored ventilation, respiratory function stable and patient connected to nasal cannula oxygen Cardiovascular status: blood pressure returned to baseline and stable Postop Assessment: no apparent nausea or vomiting Anesthetic complications: no    Last Vitals:  Vitals:   07/07/17 0409 07/07/17 0814  BP: (!) 101/44 135/63  Pulse: 82 (!) 103  Resp: 17 15  Temp: 36.6 C 36.8 C  SpO2: 94% 96%    Last Pain:  Vitals:   07/07/17 0409  TempSrc: Oral  PainSc:                  Barnet [place]

## 2017-07-13 ENCOUNTER — Encounter: Payer: Self-pay | Admitting: Cardiology

## 2017-07-13 NOTE — Progress Notes (Signed)
Cardiology Office Note  Date: 07/16/2017   ID: Diane Cohen, Diane Cohen April 14, 1945, MRN [id number]  PCP: Sinda Du, MD  Primary Cardiologist: Rozann Lesches, MD   Chief Complaint  Patient presents with  . Atrial Fibrillation    History of Present Illness: Diane Cohen is a 72 y.o. female last seen in February.  Patient recently underwent atrial fibrillation ablation with Dr. Rayann Heman in late April.  States that she is doing well, no further palpitations at this time.  Heart rate is regular.  I reviewed her TEE results and postprocedure ECG.  Reports compliance with her medications including Xarelto, Toprol-XL, and Cardizem CD.  Resting heart rate today is in the 70s.  Past Medical History:  Diagnosis Date  . Anxiety   . Atrial fibrillation ([place])   . Chronic diastolic heart failure ([place])   . Depression   . Essential hypertension   . Hyperlipidemia   . Hypothyroidism   . Pneumonia   . TIA (transient ischemic attack)     Past Surgical History:  Procedure Laterality Date  . ABDOMINAL HYSTERECTOMY    . ABLATION OF DYSRHYTHMIC FOCUS  07/06/2017  . ATRIAL FIBRILLATION ABLATION N/A 07/06/2017   Procedure: ATRIAL FIBRILLATION ABLATION;  Surgeon: Thompson Grayer, MD;  Location: [facility];  Service: Cardiovascular;  Laterality: N/A;  . BREAST SURGERY     biopsy  . CARDIOVERSION N/A 01/23/2016   Procedure: CARDIOVERSION;  Surgeon: Satira Sark, MD;  [facility];  Service: Cardiovascular;  Laterality: N/A;  . CARDIOVERSION N/A 11/13/2016   Procedure: CARDIOVERSION;  Surgeon: Arnoldo Lenis, MD;  [facility];  Service: Endoscopy;  Laterality: N/A;  . CHOLECYSTECTOMY    . coloscopy    . TEE WITHOUT CARDIOVERSION  07/06/2017  . TEE WITHOUT CARDIOVERSION N/A 07/06/2017   Procedure: TRANSESOPHAGEAL ECHOCARDIOGRAM (TEE);  Surgeon: Josue Hector, MD;  Location: [facility] ENDOSCOPY;  Service: Cardiovascular;  Laterality: N/A;    Current Outpatient  Medications  Medication Sig Dispense Refill  . acetaminophen (TYLENOL) 650 MG CR tablet Take 1,300 mg every 8 (eight) hours as needed by mouth for pain.    Marland Kitchen albuterol (PROVENTIL HFA;VENTOLIN HFA) 108 (90 BASE) MCG/ACT inhaler Inhale 2 puffs every 6 (six) hours as needed into the lungs for wheezing or shortness of breath.     . calcium carbonate (TUMS - DOSED IN MG ELEMENTAL CALCIUM) 500 MG chewable tablet Chew 4 tablets 2 (two) times daily as needed by mouth for indigestion or heartburn.    . Calcium Carbonate-Vitamin D (CALCIUM 600+D PO) Take 1 tablet by mouth daily.     . DULoxetine (CYMBALTA) 60 MG capsule Take 60 mg by mouth daily.    Marland Kitchen estradiol (ESTRACE) 1 MG tablet TAKE 1 TABLET BY MOUTH EVERY NIGHT AT BEDTIME 90 tablet 3  . ferrous sulfate 325 (65 FE) MG tablet Take 325 mg by mouth daily with breakfast.    . levothyroxine (SYNTHROID, LEVOTHROID) 137 MCG tablet Take 137 mcg by mouth daily before breakfast.     . lisinopril (PRINIVIL,ZESTRIL) 20 MG tablet Take 1 tablet (20 mg total) by mouth daily.    . magnesium oxide (MAG-OX) 400 MG tablet Take 400 mg by mouth daily.    . metoprolol succinate (TOPROL-XL) 100 MG 24 hr tablet Take 100 mg by mouth daily. Take with or immediately following a meal.    . omeprazole (PRILOSEC) 40 MG capsule Take 40 mg daily by mouth.     Marland Kitchen  potassium chloride SA (K-DUR,KLOR-CON) 20 MEQ tablet Take 2 tablets (40 mEq total) by mouth daily. 180 tablet 3  . rizatriptan (MAXALT-MLT) 10 MG disintegrating tablet Take 10 mg by mouth as needed for migraine.     . torsemide (DEMADEX) 20 MG tablet TAKE 2 TABLETS BY MOUTH EVERY MORNING 180 tablet 0  . XARELTO 20 MG TABS tablet TAKE 1 TABLET BY MOUTH EVERY DAY WITH DINNER 30 tablet 0  . diltiazem (CARDIZEM CD) 240 MG 24 hr capsule Take 1 capsule (240 mg total) by mouth daily. 90 capsule 3   No current facility-administered medications for this visit.    Allergies:  Levaquin [levofloxacin] and Sulfa antibiotics   Social  History: The patient  reports that she quit smoking about 44 years ago. Her smoking use included cigarettes. She smoked 0.50 packs per day. She has never used smokeless tobacco. She reports that she does not drink alcohol or use drugs.   ROS:  Please see the history of present illness. Otherwise, complete review of systems is positive for none.  All other systems are reviewed and negative.   Physical Exam: VS:  BP 136/68 (BP Location: Right Arm)   Pulse 78   Ht 5\' 1"  (1.549 m)   Wt 183 lb (83 kg)   SpO2 96%   BMI 34.58 kg/m , BMI Body mass index is 34.58 kg/m.  Wt Readings from Last 3 Encounters:  07/16/17 183 lb (83 kg)  07/07/17 187 lb 2.7 oz (84.9 kg)  06/14/17 189 lb (85.7 kg)    General: Patient appears comfortable at rest. HEENT: Conjunctiva and lids normal, oropharynx clear. Neck: Supple, no elevated JVP or carotid bruits, no thyromegaly. Lungs: Clear to auscultation, nonlabored breathing at rest. Cardiac: Regular rate and rhythm, no S3, 2/6 systolic murmur. Abdomen: Soft, nontender, bowel sounds present. Extremities: Mild leg edema, distal pulses 2+. Skin: Warm and dry. Musculoskeletal: No kyphosis. Neuropsychiatric: Alert and oriented x3, affect grossly appropriate.  ECG: I personally reviewed the tracing from 07/07/2017 which showed sinus rhythm with poor R wave progression.  Recent Labwork: 11/06/2016: ALT 16; AST 22 07/06/2017: BUN 16; Creatinine, Ser 1.13; Hemoglobin 13.4; Platelets 256; Potassium 3.7; Sodium 138   Other Studies Reviewed Today:  TEE 07/06/2017: Study Conclusions  - Left ventricle: Systolic function was normal. The estimated   ejection fraction was in the range of 55% to 60%. - Aortic valve: There was trivial regurgitation. - Mitral valve: There was mild to moderate regurgitation. - Left atrium: The atrium was moderately dilated. No evidence of   thrombus in the atrial cavity or appendage. - Right atrium: No evidence of thrombus in the atrial  cavity or   appendage. - Atrial septum: No defect or patent foramen ovale was identified. - Impressions: Shadowing of LAA from coumadin ridge. Mild   spontaneous contrast in LA No LAA thrombus noted after definity   injection  Impressions:  - Shadowing of LAA from coumadin ridge. Mild spontaneous contrast   in LA No LAA thrombus noted after definity injection  Assessment and Plan:  1.  Persistent atrial fibrillation with CHADSVASC score of 5.  She is now status post successful atrial fibrillation ablation per Dr. Rayann Heman in late April.  She feels better without active palpitations.  Will continue Xarelto and current dose of Toprol-XL and Cardizem CD for the time being.  2.  Essential hypertension, blood pressure control is adequate today.  Keep follow-up with Dr. Luan Pulling.  3.  Chronic diastolic heart failure, symptomatically stable.  No weight gain.  She remains on Demadex with potassium supplements.  4.  Diet managed hyperlipidemia, followed by Dr. Luan Pulling.  Current medicines were reviewed with the patient today.  Disposition: Follow-up in 6 months.  Signed, Satira Sark, MD, [facility] 07/16/2017 9:53 AM    [facility], [place], [place] [address] Phone: [phone]; Fax: [phone]

## 2017-07-14 ENCOUNTER — Other Ambulatory Visit: Payer: Self-pay | Admitting: Cardiology

## 2017-07-14 ENCOUNTER — Other Ambulatory Visit: Payer: Self-pay | Admitting: Physician Assistant

## 2017-07-16 ENCOUNTER — Ambulatory Visit (INDEPENDENT_AMBULATORY_CARE_PROVIDER_SITE_OTHER): Payer: Medicare PPO | Admitting: Cardiology

## 2017-07-16 ENCOUNTER — Encounter: Payer: Self-pay | Admitting: Cardiology

## 2017-07-16 VITALS — BP 136/68 | HR 78 | Ht 61.0 in | Wt 183.0 lb

## 2017-07-16 DIAGNOSIS — I10 Essential (primary) hypertension: Secondary | ICD-10-CM

## 2017-07-16 DIAGNOSIS — I481 Persistent atrial fibrillation: Secondary | ICD-10-CM

## 2017-07-16 DIAGNOSIS — I4819 Other persistent atrial fibrillation: Secondary | ICD-10-CM

## 2017-07-16 DIAGNOSIS — E782 Mixed hyperlipidemia: Secondary | ICD-10-CM | POA: Diagnosis not present

## 2017-07-16 DIAGNOSIS — I5032 Chronic diastolic (congestive) heart failure: Secondary | ICD-10-CM

## 2017-07-16 NOTE — Patient Instructions (Signed)
Your physician wants you to follow-up in: 6 months  with with Dr.McDowell You will receive a reminder letter in the mail two months in advance. If you don't receive a letter, please call our office to schedule the follow-up appointment.     Your physician recommends that you continue on your current medications as directed. Please refer to the Current Medication list given to you today.    If you need a refill on your cardiac medications before your next appointment, please call your pharmacy.    No lab work or tests ordered today.      Thank you for choosing [facility] !

## 2017-08-04 ENCOUNTER — Ambulatory Visit (HOSPITAL_COMMUNITY): Payer: Self-pay | Admitting: Nurse Practitioner

## 2017-08-11 ENCOUNTER — Ambulatory Visit (HOSPITAL_COMMUNITY)
Admission: RE | Admit: 2017-08-11 | Discharge: 2017-08-11 | Disposition: A | Discharge status: Home / Self Care | Payer: Medicare PPO | Source: Ambulatory Visit | Attending: Nurse Practitioner | Admitting: Nurse Practitioner

## 2017-08-11 ENCOUNTER — Encounter (HOSPITAL_COMMUNITY): Payer: Self-pay | Admitting: Nurse Practitioner

## 2017-08-11 VITALS — BP 132/74 | HR 72 | Ht 61.0 in | Wt 185.4 lb

## 2017-08-11 DIAGNOSIS — Z79899 Other long term (current) drug therapy: Secondary | ICD-10-CM | POA: Diagnosis not present

## 2017-08-11 DIAGNOSIS — I5032 Chronic diastolic (congestive) heart failure: Secondary | ICD-10-CM | POA: Diagnosis not present

## 2017-08-11 DIAGNOSIS — Z9049 Acquired absence of other specified parts of digestive tract: Secondary | ICD-10-CM | POA: Insufficient documentation

## 2017-08-11 DIAGNOSIS — Z882 Allergy status to sulfonamides status: Secondary | ICD-10-CM | POA: Diagnosis not present

## 2017-08-11 DIAGNOSIS — I491 Atrial premature depolarization: Secondary | ICD-10-CM | POA: Diagnosis not present

## 2017-08-11 DIAGNOSIS — F329 Major depressive disorder, single episode, unspecified: Secondary | ICD-10-CM | POA: Diagnosis not present

## 2017-08-11 DIAGNOSIS — Z7901 Long term (current) use of anticoagulants: Secondary | ICD-10-CM | POA: Insufficient documentation

## 2017-08-11 DIAGNOSIS — E039 Hypothyroidism, unspecified: Secondary | ICD-10-CM | POA: Diagnosis not present

## 2017-08-11 DIAGNOSIS — F419 Anxiety disorder, unspecified: Secondary | ICD-10-CM | POA: Insufficient documentation

## 2017-08-11 DIAGNOSIS — I4891 Unspecified atrial fibrillation: Secondary | ICD-10-CM | POA: Diagnosis present

## 2017-08-11 DIAGNOSIS — Z9889 Other specified postprocedural states: Secondary | ICD-10-CM | POA: Diagnosis not present

## 2017-08-11 DIAGNOSIS — Z8673 Personal history of transient ischemic attack (TIA), and cerebral infarction without residual deficits: Secondary | ICD-10-CM | POA: Insufficient documentation

## 2017-08-11 DIAGNOSIS — I4819 Other persistent atrial fibrillation: Secondary | ICD-10-CM

## 2017-08-11 DIAGNOSIS — Z87891 Personal history of nicotine dependence: Secondary | ICD-10-CM | POA: Diagnosis not present

## 2017-08-11 DIAGNOSIS — Z8249 Family history of ischemic heart disease and other diseases of the circulatory system: Secondary | ICD-10-CM | POA: Diagnosis not present

## 2017-08-11 DIAGNOSIS — E785 Hyperlipidemia, unspecified: Secondary | ICD-10-CM | POA: Insufficient documentation

## 2017-08-11 DIAGNOSIS — I481 Persistent atrial fibrillation: Secondary | ICD-10-CM | POA: Diagnosis not present

## 2017-08-11 DIAGNOSIS — Z881 Allergy status to other antibiotic agents status: Secondary | ICD-10-CM | POA: Diagnosis not present

## 2017-08-11 DIAGNOSIS — Z79890 Hormone replacement therapy (postmenopausal): Secondary | ICD-10-CM | POA: Diagnosis not present

## 2017-08-11 DIAGNOSIS — I110 Hypertensive heart disease with heart failure: Secondary | ICD-10-CM | POA: Insufficient documentation

## 2017-08-11 NOTE — Progress Notes (Signed)
Primary Care Physician: Sinda Du, MD Referring Physician: Dr. Dionne Bucy is a 72 y.o. female with a h/o chronic diastolic heart failure, HTN, afib, previous TIA, that  Is in the afib clinic for f/u one month ablation. He has noticed some afib not more than an hour at a time. Improved from prior to ablation. Continues on anticoagulation. Denis any swallowing or groin issues.  Today, she denies symptoms of palpitations, chest pain, shortness of breath, orthopnea, PND, lower extremity edema, dizziness, presyncope, syncope, or neurologic sequela. The patient is tolerating medications without difficulties and is otherwise without complaint today.   Past Medical History:  Diagnosis Date  . Anxiety   . Atrial fibrillation ([place])   . Chronic diastolic heart failure ([place])   . Depression   . Essential hypertension   . Hyperlipidemia   . Hypothyroidism   . Pneumonia   . TIA (transient ischemic attack)    Past Surgical History:  Procedure Laterality Date  . ABDOMINAL HYSTERECTOMY    . ABLATION OF DYSRHYTHMIC FOCUS  07/06/2017  . ATRIAL FIBRILLATION ABLATION N/A 07/06/2017   Procedure: ATRIAL FIBRILLATION ABLATION;  Surgeon: Thompson Grayer, MD;  Location: [facility];  Service: Cardiovascular;  Laterality: N/A;  . BREAST SURGERY     biopsy  . CARDIOVERSION N/A 01/23/2016   Procedure: CARDIOVERSION;  Surgeon: Satira Sark, MD;  [facility];  Service: Cardiovascular;  Laterality: N/A;  . CARDIOVERSION N/A 11/13/2016   Procedure: CARDIOVERSION;  Surgeon: Arnoldo Lenis, MD;  [facility];  Service: Endoscopy;  Laterality: N/A;  . CHOLECYSTECTOMY    . coloscopy    . TEE WITHOUT CARDIOVERSION  07/06/2017  . TEE WITHOUT CARDIOVERSION N/A 07/06/2017   Procedure: TRANSESOPHAGEAL ECHOCARDIOGRAM (TEE);  Surgeon: Josue Hector, MD;  Location: [facility] ENDOSCOPY;  Service: Cardiovascular;  Laterality: N/A;    Current Outpatient Medications    Medication Sig Dispense Refill  . acetaminophen (TYLENOL) 650 MG CR tablet Take 1,300 mg every 8 (eight) hours as needed by mouth for pain.    Marland Kitchen albuterol (PROVENTIL HFA;VENTOLIN HFA) 108 (90 BASE) MCG/ACT inhaler Inhale 2 puffs every 6 (six) hours as needed into the lungs for wheezing or shortness of breath.     . calcium carbonate (TUMS - DOSED IN MG ELEMENTAL CALCIUM) 500 MG chewable tablet Chew 4 tablets 2 (two) times daily as needed by mouth for indigestion or heartburn.    . Calcium Carbonate-Vitamin D (CALCIUM 600+D PO) Take 1 tablet by mouth daily.     Marland Kitchen diltiazem (CARDIZEM CD) 240 MG 24 hr capsule Take 1 capsule (240 mg total) by mouth daily. 90 capsule 3  . DULoxetine (CYMBALTA) 60 MG capsule Take 60 mg by mouth daily.    Marland Kitchen estradiol (ESTRACE) 1 MG tablet TAKE 1 TABLET BY MOUTH EVERY NIGHT AT BEDTIME 90 tablet 3  . ferrous sulfate 325 (65 FE) MG tablet Take 325 mg by mouth daily with breakfast.    . levothyroxine (SYNTHROID, LEVOTHROID) 137 MCG tablet Take 137 mcg by mouth daily before breakfast.     . lisinopril (PRINIVIL,ZESTRIL) 20 MG tablet Take 1 tablet (20 mg total) by mouth daily.    . magnesium oxide (MAG-OX) 400 MG tablet Take 400 mg by mouth daily.    . metoprolol succinate (TOPROL-XL) 100 MG 24 hr tablet Take 100 mg by mouth daily. Take with or immediately following a meal.    . omeprazole (PRILOSEC) 40 MG capsule Take 40  mg daily by mouth.     . potassium chloride SA (K-DUR,KLOR-CON) 20 MEQ tablet Take 2 tablets (40 mEq total) by mouth daily. 180 tablet 3  . rizatriptan (MAXALT-MLT) 10 MG disintegrating tablet Take 10 mg by mouth as needed for migraine.     . torsemide (DEMADEX) 20 MG tablet TAKE 2 TABLETS BY MOUTH EVERY MORNING 180 tablet 0  . XARELTO 20 MG TABS tablet TAKE 1 TABLET BY MOUTH EVERY DAY WITH DINNER 30 tablet 0   No current facility-administered medications for this encounter.     Allergies  Allergen Reactions  . Levaquin [Levofloxacin] Other (See  Comments)    Headache   . Sulfa Antibiotics Hives    Social History   Socioeconomic History  . Marital status: Married    Spouse name: Not on file  . Number of children: Not on file  . Years of education: Not on file  . Highest education level: Not on file  Occupational History  . Not on file  Social Needs  . Financial resource strain: Not on file  . Food insecurity:    Worry: Not on file    Inability: Not on file  . Transportation needs:    Medical: Not on file    Non-medical: Not on file  Tobacco Use  . Smoking status: Former Smoker    Packs/day: 0.50    Types: Cigarettes    Last attempt to quit: 01/22/1973    Years since quitting: 44.5  . Smokeless tobacco: Never Used  Substance and Sexual Activity  . Alcohol use: No    Alcohol/week: 0.0 oz  . Drug use: No  . Sexual activity: Yes    Birth control/protection: Surgical  Lifestyle  . Physical activity:    Days per week: Not on file    Minutes per session: Not on file  . Stress: Not on file  Relationships  . Social connections:    Talks on phone: Not on file    Gets together: Not on file    Attends religious service: Not on file    Active member of club or organization: Not on file    Attends meetings of clubs or organizations: Not on file    Relationship status: Not on file  . Intimate partner violence:    Fear of current or ex partner: Not on file    Emotionally abused: Not on file    Physically abused: Not on file    Forced sexual activity: Not on file  Other Topics Concern  . Not on file  Social History Narrative   Lives with spouse in [place] has ALS and is dependant on the patient for transition from bed to chair.    Family History  Problem Relation Age of Onset  . Hypertension Unknown   . Hypertension Father   . Hypertension Mother   . Anemia Sister   . Hypertension Sister     ROS- All systems are reviewed and negative except as per the HPI above  Physical Exam: Vitals:    08/11/17 1330  BP: 132/74  Pulse: 72  Weight: 185 lb 6.4 oz (84.1 kg)  Height: 5\' 1"  (1.549 m)   Wt Readings from Last 3 Encounters:  08/11/17 185 lb 6.4 oz (84.1 kg)  07/16/17 183 lb (83 kg)  07/07/17 187 lb 2.7 oz (84.9 kg)    Labs: Lab Results  Component Value Date   NA 138 07/06/2017   K 3.7 07/06/2017   CL 102  07/06/2017   CO2 28 07/06/2017   GLUCOSE 100 (H) 07/06/2017   BUN 16 07/06/2017   CREATININE 1.13 (H) 07/06/2017   CALCIUM 9.8 07/06/2017   MG 2.1 12/29/2013   Lab Results  Component Value Date   INR 1.31 01/22/2016   No results found for: CHOL, HDL, LDLCALC, TRIG   GEN- The patient is well appearing, alert and oriented x 3 today.   Head- normocephalic, atraumatic Eyes-  Sclera clear, conjunctiva pink Ears- hearing intact Oropharynx- clear Neck- supple, no JVP Lymph- no cervical lymphadenopathy Lungs- Clear to ausculation bilaterally, normal work of breathing Heart- Regular rate and rhythm, no murmurs, rubs or gallops, PMI not laterally displaced GI- soft, NT, ND, + BS Extremities- no clubbing, cyanosis, or edema MS- no significant deformity or atrophy Skin- no rash or lesion Psych- euthymic mood, full affect Neuro- strength and sensation are intact  EKG-SR at 72 bpm, PR int 172 bpm, qrs int 74 ms, qtc 446 ms with PAC's Epic records reviewed    Assessment and Plan: 1. Persistent afib S/p ablation In SR today Continue metorpolol succinate 100 mg daily  Continue Cardizem 240 mg daily  Continue xarelto 20 mg daily for chadsvasc score of  at least 6, aware not to interrupt anticoagulation during the healing period  2. HTN  Stable  3. HF Fluid status stable  F/u with Dr. Rayann Heman as scheduled 7/31

## 2017-08-13 ENCOUNTER — Other Ambulatory Visit: Payer: Self-pay | Admitting: Cardiology

## 2017-08-13 NOTE — Telephone Encounter (Signed)
refilled xarelto per fax request

## 2017-09-01 NOTE — Addendum Note (Signed)
Encounter addended by: Sherran Needs, NP on: 09/01/2017 8:52 AM  Actions taken: LOS modified

## 2017-09-20 DIAGNOSIS — I5030 Unspecified diastolic (congestive) heart failure: Secondary | ICD-10-CM | POA: Diagnosis not present

## 2017-09-20 DIAGNOSIS — I482 Chronic atrial fibrillation: Secondary | ICD-10-CM | POA: Diagnosis not present

## 2017-09-20 DIAGNOSIS — I10 Essential (primary) hypertension: Secondary | ICD-10-CM | POA: Diagnosis not present

## 2017-09-20 DIAGNOSIS — I872 Venous insufficiency (chronic) (peripheral): Secondary | ICD-10-CM | POA: Diagnosis not present

## 2017-10-06 ENCOUNTER — Ambulatory Visit (INDEPENDENT_AMBULATORY_CARE_PROVIDER_SITE_OTHER): Payer: Medicare PPO | Admitting: Internal Medicine

## 2017-10-06 ENCOUNTER — Other Ambulatory Visit: Payer: Self-pay | Admitting: Internal Medicine

## 2017-10-06 ENCOUNTER — Encounter: Payer: Self-pay | Admitting: Internal Medicine

## 2017-10-06 VITALS — BP 138/80 | HR 71 | Ht 61.0 in | Wt 192.6 lb

## 2017-10-06 DIAGNOSIS — I481 Persistent atrial fibrillation: Secondary | ICD-10-CM

## 2017-10-06 DIAGNOSIS — I4819 Other persistent atrial fibrillation: Secondary | ICD-10-CM

## 2017-10-06 DIAGNOSIS — R0683 Snoring: Secondary | ICD-10-CM

## 2017-10-06 MED ORDER — DILTIAZEM HCL 30 MG PO TABS: 30.0000 mg | ORAL_TABLET | Freq: Four times a day (QID) | ORAL | 3 refills | Status: DC | PRN

## 2017-10-06 NOTE — Progress Notes (Signed)
PCP: Sinda Du, MD Primary Cardiologist: Dr Gwyndolyn Saxon is a 72 y.o. female who presents today for routine electrophysiology followup.  Since his recent afib ablation, the patient reports doing very well.  she denies procedure related complications and is pleased with the results of the procedure.  She continues to have fatigue and is tired despite sinus rhythm.  Her husband (he has Lou gehrig's disease).  She is up most of the night caring for him.  I think that this is the source for her symptoms.  Rare palpitations. Today, she denies symptoms of chest pain, shortness of breath,  lower extremity edema, dizziness, presyncope, or syncope.  The patient is otherwise without complaint today.   Past Medical History:  Diagnosis Date  . Anxiety   . Atrial fibrillation ([place])   . Chronic diastolic heart failure ([place])   . Depression   . Essential hypertension   . Hyperlipidemia   . Hypothyroidism   . Pneumonia   . TIA (transient ischemic attack)    Past Surgical History:  Procedure Laterality Date  . ABDOMINAL HYSTERECTOMY    . ABLATION OF DYSRHYTHMIC FOCUS  07/06/2017  . ATRIAL FIBRILLATION ABLATION N/A 07/06/2017   Procedure: ATRIAL FIBRILLATION ABLATION;  Surgeon: Thompson Grayer, MD;  Location: [facility];  Service: Cardiovascular;  Laterality: N/A;  . BREAST SURGERY     biopsy  . CARDIOVERSION N/A 01/23/2016   Procedure: CARDIOVERSION;  Surgeon: Satira Sark, MD;  [facility];  Service: Cardiovascular;  Laterality: N/A;  . CARDIOVERSION N/A 11/13/2016   Procedure: CARDIOVERSION;  Surgeon: Arnoldo Lenis, MD;  [facility];  Service: Endoscopy;  Laterality: N/A;  . CHOLECYSTECTOMY    . coloscopy    . TEE WITHOUT CARDIOVERSION  07/06/2017  . TEE WITHOUT CARDIOVERSION N/A 07/06/2017   Procedure: TRANSESOPHAGEAL ECHOCARDIOGRAM (TEE);  Surgeon: Josue Hector, MD;  Location: [facility] ENDOSCOPY;  Service: Cardiovascular;  Laterality: N/A;     ROS- all systems are personally reviewed and negatives except as per HPI above  Current Outpatient Medications  Medication Sig Dispense Refill  . acetaminophen (TYLENOL) 650 MG CR tablet Take 1,300 mg every 8 (eight) hours as needed by mouth for pain.    Marland Kitchen albuterol (PROVENTIL HFA;VENTOLIN HFA) 108 (90 BASE) MCG/ACT inhaler Inhale 2 puffs every 6 (six) hours as needed into the lungs for wheezing or shortness of breath.     . calcium carbonate (TUMS - DOSED IN MG ELEMENTAL CALCIUM) 500 MG chewable tablet Chew 4 tablets 2 (two) times daily as needed by mouth for indigestion or heartburn.    . Calcium Carbonate-Vitamin D (CALCIUM 600+D PO) Take 1 tablet by mouth daily.     Marland Kitchen diltiazem (CARDIZEM CD) 240 MG 24 hr capsule Take 1 capsule (240 mg total) by mouth daily. 90 capsule 3  . DULoxetine (CYMBALTA) 60 MG capsule Take 60 mg by mouth daily.    Marland Kitchen estradiol (ESTRACE) 1 MG tablet TAKE 1 TABLET BY MOUTH EVERY NIGHT AT BEDTIME 90 tablet 3  . ferrous sulfate 325 (65 FE) MG tablet Take 325 mg by mouth daily with breakfast.    . levothyroxine (SYNTHROID, LEVOTHROID) 137 MCG tablet Take 137 mcg by mouth daily before breakfast.     . lisinopril (PRINIVIL,ZESTRIL) 20 MG tablet Take 1 tablet (20 mg total) by mouth daily.    . magnesium oxide (MAG-OX) 400 MG tablet Take 400 mg by mouth daily.    . metoprolol succinate (TOPROL-XL)  100 MG 24 hr tablet Take 100 mg by mouth daily. Take with or immediately following a meal.    . Multiple Vitamins-Minerals (CENTRUM SILVER PO) Take 1 tablet by mouth daily.    Marland Kitchen omeprazole (PRILOSEC) 40 MG capsule Take 40 mg daily by mouth.     . potassium chloride SA (K-DUR,KLOR-CON) 20 MEQ tablet Take 2 tablets (40 mEq total) by mouth daily. 180 tablet 3  . rizatriptan (MAXALT-MLT) 10 MG disintegrating tablet Take 10 mg by mouth as needed for migraine.     . torsemide (DEMADEX) 20 MG tablet TAKE 2 TABLETS BY MOUTH EVERY MORNING 180 tablet 0  . XARELTO 20 MG TABS tablet TAKE 1  TABLET BY MOUTH EVERY DAY WITH DINNER 30 tablet 6   No current facility-administered medications for this visit.     Physical Exam: Vitals:   10/06/17 1242  BP: 138/80  Pulse: 71  SpO2: 95%  Weight: 192 lb 9.6 oz (87.4 kg)  Height: 5\' 1"  (1.549 m)    GEN- The patient is well appearing, alert and oriented x 3 today.   Head- normocephalic, atraumatic Eyes-  Sclera clear, conjunctiva pink Ears- hearing intact Oropharynx- clear Lungs- Clear to ausculation bilaterally, normal work of breathing Heart- Regular rate and rhythm, no murmurs, rubs or gallops, PMI not laterally displaced GI- soft, NT, ND, + BS Extremities- no clubbing, cyanosis, or edema  EKG tracing ordered today is personally reviewed and shows sinus rhythm with PACs, 71 bpm, PR 170 msec, QRS 74 msec, QTc 434 msec  Assessment and Plan:  1. Persistent  atrial fibrillation Doing well s/p ablation chads2vasc score is 5.  Continue long term anticoagulation Add cardizem 30mg  prn palpitatons Hopefully we can begin to wean medicines upon return if doing well.  If she continues to have palpitations at that time, I would favor ILR consideration.  I would not advise 30 day monitors for her going forward.  2. Overweight Body mass index is 36.39 kg/m. Wt Readings from Last 3 Encounters:  10/06/17 192 lb 9.6 oz (87.4 kg)  08/11/17 185 lb 6.4 oz (84.1 kg)  07/16/17 183 lb (83 kg)   Lifestyle modificawtion encouraged  3. Snoring I have advised sleep study  Return to see me in 3 months  Thompson Grayer MD, [facility] 10/06/2017 1:12 PM

## 2017-10-06 NOTE — Telephone Encounter (Signed)
This is a [place] pt.  °

## 2017-10-06 NOTE — Patient Instructions (Signed)
Medication Instructions:  Your physician has recommended you make the following change in your medication:  1. TAKE Diltiazem 30 mg every 6 hours as needed for atrial fibrillation.  * If you need a refill on your cardiac medications before your next appointment, please call your pharmacy.   Labwork: None ordered  Testing/Procedures: None ordered  Follow-Up: Your physician recommends that you schedule a follow-up appointment in: 3 months with Dr. Rayann Heman.   Thank you for choosing [facility]!!

## 2017-10-07 ENCOUNTER — Other Ambulatory Visit: Payer: Self-pay | Admitting: Cardiology

## 2017-10-15 ENCOUNTER — Encounter (INDEPENDENT_AMBULATORY_CARE_PROVIDER_SITE_OTHER): Payer: Self-pay | Admitting: Vascular Surgery

## 2017-10-22 ENCOUNTER — Encounter (INDEPENDENT_AMBULATORY_CARE_PROVIDER_SITE_OTHER): Payer: Medicare PPO | Admitting: Vascular Surgery

## 2017-11-09 ENCOUNTER — Encounter (INDEPENDENT_AMBULATORY_CARE_PROVIDER_SITE_OTHER): Payer: Self-pay | Admitting: Vascular Surgery

## 2017-11-09 ENCOUNTER — Ambulatory Visit (INDEPENDENT_AMBULATORY_CARE_PROVIDER_SITE_OTHER): Payer: Medicare PPO | Admitting: Vascular Surgery

## 2017-11-09 VITALS — BP 153/81 | HR 87 | Resp 16 | Ht 61.0 in | Wt 193.6 lb

## 2017-11-09 DIAGNOSIS — M7989 Other specified soft tissue disorders: Secondary | ICD-10-CM | POA: Diagnosis not present

## 2017-11-09 DIAGNOSIS — I10 Essential (primary) hypertension: Secondary | ICD-10-CM

## 2017-11-09 DIAGNOSIS — I481 Persistent atrial fibrillation: Secondary | ICD-10-CM | POA: Diagnosis not present

## 2017-11-09 DIAGNOSIS — I4819 Other persistent atrial fibrillation: Secondary | ICD-10-CM

## 2017-11-09 NOTE — Progress Notes (Signed)
Patient ID: Diane Cohen, female   DOB: 02-20-1946, 72 y.o.   MRN: [id number]  Chief Complaint  Patient presents with  . New Patient (Initial Visit)    ref Luan Pulling for venous insuffciency    HPI Diane Cohen is a 72 y.o. female.  I am asked to see the patient by Dr. Luan Pulling for evaluation of leg sewlling.  The patient reports years of swelling in the lower extremities.  Left lower extremity is the more severely affected of the 2 legs.  She does not have a previous history of DVT or superficial thrombophlebitis to her knowledge.  She denies any clear inciting event or causative factor that started the symptoms.  She is on her feet much of the time and has to care for a chronically ill husband.  This clearly exacerbates her symptoms.  She has tried compression stockings in the past but they have just rolled down her legs and not really seem to help much.  She has been treated by heart disease and her atrial fibrillation is now under control.   Past Medical History:  Diagnosis Date  . Anxiety   . Atrial fibrillation ([place])   . Chronic diastolic heart failure ([place])   . Depression   . Essential hypertension   . Hyperlipidemia   . Hypothyroidism   . Pneumonia   . TIA (transient ischemic attack)     Past Surgical History:  Procedure Laterality Date  . ABDOMINAL HYSTERECTOMY    . ABLATION OF DYSRHYTHMIC FOCUS  07/06/2017  . ATRIAL FIBRILLATION ABLATION N/A 07/06/2017   Procedure: ATRIAL FIBRILLATION ABLATION;  Surgeon: Thompson Grayer, MD;  Location: [place] [facility];  Service: Cardiovascular;  Laterality: N/A;  . BREAST SURGERY     biopsy  . CARDIOVERSION N/A 01/23/2016   Procedure: CARDIOVERSION;  Surgeon: Satira Sark, MD;  [facility];  Service: Cardiovascular;  Laterality: N/A;  . CARDIOVERSION N/A 11/13/2016   Procedure: CARDIOVERSION;  Surgeon: Arnoldo Lenis, MD;  [facility];  Service: Endoscopy;  Laterality: N/A;  . CHOLECYSTECTOMY    .  coloscopy    . TEE WITHOUT CARDIOVERSION  07/06/2017  . TEE WITHOUT CARDIOVERSION N/A 07/06/2017   Procedure: TRANSESOPHAGEAL ECHOCARDIOGRAM (TEE);  Surgeon: Josue Hector, MD;  Location: [facility];  Service: Cardiovascular;  Laterality: N/A;    Family History  Problem Relation Age of Onset  . Hypertension Unknown   . Hypertension Father   . Hypertension Mother   . Anemia Sister   . Hypertension Sister     Social History Social History   Tobacco Use  . Smoking status: Former Smoker    Packs/day: 0.50    Types: Cigarettes    Last attempt to quit: 01/22/1973    Years since quitting: 44.8  . Smokeless tobacco: Never Used  Substance Use Topics  . Alcohol use: No    Alcohol/week: 0.0 standard drinks  . Drug use: No     Allergies  Allergen Reactions  . Levaquin [Levofloxacin] Other (See Comments)    Headache   . Sulfa Antibiotics Hives    Current Outpatient Medications  Medication Sig Dispense Refill  . acetaminophen (TYLENOL) 650 MG CR tablet Take 1,300 mg every 8 (eight) hours as needed by mouth for pain.    Marland Kitchen albuterol (PROVENTIL HFA;VENTOLIN HFA) 108 (90 BASE) MCG/ACT inhaler Inhale 2 puffs every 6 (six) hours as needed into the lungs for wheezing or shortness of breath.     Marland Kitchen  calcium carbonate (TUMS - DOSED IN MG ELEMENTAL CALCIUM) 500 MG chewable tablet Chew 4 tablets 2 (two) times daily as needed by mouth for indigestion or heartburn.    . Calcium Carbonate-Vitamin D (CALCIUM 600+D PO) Take 1 tablet by mouth daily.     Marland Kitchen diltiazem (CARDIZEM CD) 240 MG 24 hr capsule Take 1 capsule (240 mg total) by mouth daily. 90 capsule 3  . diltiazem (CARDIZEM) 30 MG tablet Take 1 tablet (30 mg total) by mouth 4 (four) times daily as needed (for atrial fibrillation). 60 tablet 3  . DULoxetine (CYMBALTA) 60 MG capsule Take 60 mg by mouth daily.    Marland Kitchen estradiol (ESTRACE) 1 MG tablet TAKE 1 TABLET BY MOUTH EVERY NIGHT AT BEDTIME 90 tablet 3  . ferrous sulfate 325 (65 FE) MG tablet  Take 325 mg by mouth daily with breakfast.    . levothyroxine (SYNTHROID, LEVOTHROID) 137 MCG tablet Take 137 mcg by mouth daily before breakfast.     . lisinopril (PRINIVIL,ZESTRIL) 20 MG tablet Take 1 tablet (20 mg total) by mouth daily.    . magnesium oxide (MAG-OX) 400 MG tablet Take 400 mg by mouth daily.    . metoprolol succinate (TOPROL-XL) 100 MG 24 hr tablet Take 100 mg by mouth daily. Take with or immediately following a meal.    . Multiple Vitamins-Minerals (CENTRUM SILVER PO) Take 1 tablet by mouth daily.    Marland Kitchen omeprazole (PRILOSEC) 40 MG capsule Take 40 mg daily by mouth.     . potassium chloride SA (K-DUR,KLOR-CON) 20 MEQ tablet Take 2 tablets (40 mEq total) by mouth daily. 180 tablet 3  . rizatriptan (MAXALT-MLT) 10 MG disintegrating tablet Take 10 mg by mouth as needed for migraine.     . torsemide (DEMADEX) 20 MG tablet TAKE 2 TABLETS BY MOUTH EVERY MORNING 180 tablet 1  . XARELTO 20 MG TABS tablet TAKE 1 TABLET BY MOUTH EVERY DAY WITH DINNER 30 tablet 6   No current facility-administered medications for this visit.       REVIEW OF SYSTEMS (Negative unless checked)  Constitutional: [] Weight loss  [] Fever  [] Chills Cardiac: [] Chest pain   [] Chest pressure   [] Palpitations   [] Shortness of breath when laying flat   [] Shortness of breath at rest   [] Shortness of breath with exertion. Vascular:  [] Pain in legs with walking   [] Pain in legs at rest   [] Pain in legs when laying flat   [] Claudication   [] Pain in feet when walking  [] Pain in feet at rest  [] Pain in feet when laying flat   [] History of DVT   [] Phlebitis   [] Swelling in legs   [] Varicose veins   [] Non-healing ulcers Pulmonary:   [] Uses home oxygen   [] Productive cough   [] Hemoptysis   [] Wheeze  [] COPD   [] Asthma Neurologic:  [] Dizziness  [] Blackouts   [] Seizures   [] History of stroke   [] History of TIA  [] Aphasia   [] Temporary blindness   [] Dysphagia   [] Weakness or numbness in arms   [] Weakness or numbness in  legs Musculoskeletal:  [] Arthritis   [] Joint swelling   [] Joint pain   [] Low back pain Hematologic:  [] Easy bruising  [] Easy bleeding   [] Hypercoagulable state   [] Anemic  [] Hepatitis Gastrointestinal:  [] Blood in stool   [] Vomiting blood  [] Gastroesophageal reflux/heartburn   [] Abdominal pain Genitourinary:  [] Chronic kidney disease   [] Difficult urination  [] Frequent urination  [] Burning with urination   [] Hematuria Skin:  [] Rashes   [] Ulcers   []   Wounds Psychological:  [] History of anxiety   []  History of major depression.    Physical Exam BP (!) 153/81 (BP Location: Right Arm)   Pulse 87   Resp 16   Ht 5\' 1"  (1.549 m)   Wt 193 lb 9.6 oz (87.8 kg)   BMI 36.58 kg/m  Gen:  WD/WN, NAD Head: [place]/AT, No temporalis wasting Ear/Nose/Throat: Hearing grossly intact, nares w/o erythema or drainage, oropharynx w/o Erythema/Exudate Eyes: Conjunctiva clear, sclera non-icteric  Neck: trachea midline.  No JVD.  Pulmonary:  Good air movement, no use of accessory muscles Cardiac: Irregular Vascular: Diffuse varicosities bilaterally with the largest measuring 2 to 3 mm in size Vessel Right Left  Radial Palpable Palpable                          PT 1+ Palpable  not palpable  DP 1+ Palpable Trace Palpable   Gastrointestinal: soft, non-tender/non-distended. Musculoskeletal: M/S 5/5 throughout.  Extremities without ischemic changes.  No deformity or atrophy. 1-2+ RLE edema, 3+ LLE edema. Neurologic: Sensation grossly intact in extremities.  Symmetrical.  Speech is fluent. Motor exam as listed above. Psychiatric: Judgment intact, Mood & affect appropriate for pt's clinical situation. Dermatologic: No rashes or ulcers noted.  No cellulitis or open wounds.  Radiology No results found.  Labs No results found for this or any previous visit (from the past 2160 hour(s)).  Assessment/Plan:  Essential hypertension, benign blood pressure control important in reducing the progression of  atherosclerotic disease. On appropriate oral medications.   Persistent atrial fibrillation (HCC) On anticoagulation and medical management.  Follows with cardiology  Swelling of limb I have had a long discussion with the patient regarding swelling and why it  causes symptoms.  Patient will begin wearing graduated compression stockings class 1 (20-30 mmHg) on a daily basis a prescription was given. The patient will  beginning wearing the stockings first thing in the morning and removing them in the evening. The patient is instructed specifically not to sleep in the stockings.   In addition, behavioral modification will be initiated.  This will include frequent elevation, use of over the counter pain medications and exercise such as walking.  I have reviewed systemic causes for chronic edema such as liver, kidney and cardiac etiologies.  The patient denies problems with these organ systems.    Consideration for a lymph pump will also be made based upon the effectiveness of conservative therapy.  This would help to improve the edema control and prevent sequela such as ulcers and infections   Patient should undergo duplex ultrasound of the venous system to ensure that DVT or reflux is not present.  The patient will follow-up with me after the ultrasound.        Leotis Pain 11/09/2017, 3:35 PM   This note was created with Dragon medical transcription system.  Any errors from dictation are unintentional.

## 2017-11-09 NOTE — Assessment & Plan Note (Signed)
blood pressure control important in reducing the progression of atherosclerotic disease. On appropriate oral medications.  

## 2017-11-09 NOTE — Assessment & Plan Note (Signed)
On anticoagulation and medical management.  Follows with cardiology

## 2017-11-09 NOTE — Assessment & Plan Note (Signed)

## 2017-11-09 NOTE — Patient Instructions (Signed)

## 2017-11-10 ENCOUNTER — Ambulatory Visit (INDEPENDENT_AMBULATORY_CARE_PROVIDER_SITE_OTHER): Payer: Self-pay | Admitting: Nurse Practitioner

## 2017-11-10 ENCOUNTER — Encounter (INDEPENDENT_AMBULATORY_CARE_PROVIDER_SITE_OTHER): Payer: Self-pay

## 2018-01-12 ENCOUNTER — Encounter: Payer: Self-pay | Admitting: Internal Medicine

## 2018-01-12 ENCOUNTER — Ambulatory Visit (INDEPENDENT_AMBULATORY_CARE_PROVIDER_SITE_OTHER): Payer: Medicare PPO | Admitting: Internal Medicine

## 2018-01-12 VITALS — BP 132/80 | HR 75 | Ht 61.0 in | Wt 188.8 lb

## 2018-01-12 DIAGNOSIS — I4819 Other persistent atrial fibrillation: Secondary | ICD-10-CM | POA: Diagnosis not present

## 2018-01-12 MED ORDER — METOPROLOL SUCCINATE ER 50 MG PO TB24: 50.0000 mg | ORAL_TABLET | Freq: Every day | ORAL | 3 refills | Status: DC

## 2018-01-12 NOTE — Patient Instructions (Signed)
Medication Instructions:  Your physician has recommended you make the following change in your medication:   DECREASE: metoprolol succinate (Toprol) to 50 mg daily   If you need a refill on your cardiac medications before your next appointment, please call your pharmacy.   Lab work: None Ordered  If you have labs (blood work) drawn today and your tests are completely normal, you will receive your results only by: Marland Kitchen MyChart Message (if you have MyChart) OR . A paper copy in the mail If you have any lab test that is abnormal or we need to change your treatment, we will call you to review the results.  Testing/Procedures: None ordered  Follow-Up: . Your physician recommends that you schedule a follow-up appointment in: 3 months with Roderic Palau, NP  . Your physician recommends that you schedule a follow-up appointment in: 6 months with Dr. Rayann Heman     Any Other Special Instructions Will Be Listed Below (If Applicable).

## 2018-01-12 NOTE — Progress Notes (Signed)
PCP: Sinda Du, MD Primary Cardiologist: Dr Domenic Polite Primary EP: Dr Rayann Heman  Diane Cohen is a 72 y.o. female who presents today for routine electrophysiology followup.  Since last being seen in our clinic, the patient reports doing very well.  She feels that her afib is well controlled.  She has fatigue.  She doesn't think that she is snoring "anymore" and again declines sleep study today.  Today, she denies symptoms of palpitations, chest pain, shortness of breath,  lower extremity edema, dizziness, presyncope, or syncope.  The patient is otherwise without complaint today.   Past Medical History:  Diagnosis Date  . Anxiety   . Atrial fibrillation ([place])   . Chronic diastolic heart failure ([place])   . Depression   . Essential hypertension   . Hyperlipidemia   . Hypothyroidism   . Pneumonia   . TIA (transient ischemic attack)    Past Surgical History:  Procedure Laterality Date  . ABDOMINAL HYSTERECTOMY    . ABLATION OF DYSRHYTHMIC FOCUS  07/06/2017  . ATRIAL FIBRILLATION ABLATION N/A 07/06/2017   Procedure: ATRIAL FIBRILLATION ABLATION;  Surgeon: Thompson Grayer, MD;  Location: [place] [facility];  Service: Cardiovascular;  Laterality: N/A;  . BREAST SURGERY     biopsy  . CARDIOVERSION N/A 01/23/2016   Procedure: CARDIOVERSION;  Surgeon: Satira Sark, MD;  [facility];  Service: Cardiovascular;  Laterality: N/A;  . CARDIOVERSION N/A 11/13/2016   Procedure: CARDIOVERSION;  Surgeon: Arnoldo Lenis, MD;  [facility];  Service: Endoscopy;  Laterality: N/A;  . CHOLECYSTECTOMY    . coloscopy    . TEE WITHOUT CARDIOVERSION  07/06/2017  . TEE WITHOUT CARDIOVERSION N/A 07/06/2017   Procedure: TRANSESOPHAGEAL ECHOCARDIOGRAM (TEE);  Surgeon: Josue Hector, MD;  Location: [facility] ENDOSCOPY;  Service: Cardiovascular;  Laterality: N/A;    ROS- all systems are reviewed and negatives except as per HPI above  Current Outpatient Medications  Medication Sig  Dispense Refill  . acetaminophen (TYLENOL) 650 MG CR tablet Take 1,300 mg every 8 (eight) hours as needed by mouth for pain.    Marland Kitchen albuterol (PROVENTIL HFA;VENTOLIN HFA) 108 (90 BASE) MCG/ACT inhaler Inhale 2 puffs every 6 (six) hours as needed into the lungs for wheezing or shortness of breath.     . calcium carbonate (TUMS - DOSED IN MG ELEMENTAL CALCIUM) 500 MG chewable tablet Chew 4 tablets 2 (two) times daily as needed by mouth for indigestion or heartburn.    . Calcium Carbonate-Vitamin D (CALCIUM 600+D PO) Take 1 tablet by mouth daily.     Marland Kitchen diltiazem (CARDIZEM CD) 240 MG 24 hr capsule Take 1 capsule (240 mg total) by mouth daily. 90 capsule 3  . diltiazem (CARDIZEM) 30 MG tablet Take 1 tablet (30 mg total) by mouth 4 (four) times daily as needed (for atrial fibrillation). 60 tablet 3  . DULoxetine (CYMBALTA) 60 MG capsule Take 60 mg by mouth daily.    Marland Kitchen estradiol (ESTRACE) 1 MG tablet TAKE 1 TABLET BY MOUTH EVERY NIGHT AT BEDTIME 90 tablet 3  . ferrous sulfate 325 (65 FE) MG tablet Take 325 mg by mouth daily with breakfast.    . levothyroxine (SYNTHROID, LEVOTHROID) 137 MCG tablet Take 137 mcg by mouth daily before breakfast.     . lisinopril (PRINIVIL,ZESTRIL) 20 MG tablet Take 1 tablet (20 mg total) by mouth daily.    . magnesium oxide (MAG-OX) 400 MG tablet Take 400 mg by mouth daily.    . metoprolol  succinate (TOPROL-XL) 100 MG 24 hr tablet Take 100 mg by mouth daily. Take with or immediately following a meal.    . Multiple Vitamins-Minerals (CENTRUM SILVER PO) Take 1 tablet by mouth daily.    Marland Kitchen omeprazole (PRILOSEC) 40 MG capsule Take 40 mg daily by mouth.     . potassium chloride SA (K-DUR,KLOR-CON) 20 MEQ tablet Take 2 tablets (40 mEq total) by mouth daily. 180 tablet 3  . rizatriptan (MAXALT-MLT) 10 MG disintegrating tablet Take 10 mg by mouth as needed for migraine.     . torsemide (DEMADEX) 20 MG tablet TAKE 2 TABLETS BY MOUTH EVERY MORNING 180 tablet 1  . XARELTO 20 MG TABS  tablet TAKE 1 TABLET BY MOUTH EVERY DAY WITH DINNER 30 tablet 6   No current facility-administered medications for this visit.     Physical Exam: Vitals:   01/12/18 1401  BP: 132/80  Pulse: 75  SpO2: 95%  Weight: 188 lb 12.8 oz (85.6 kg)  Height: 5\' 1"  (1.549 m)    GEN- The patient is overweight appearing, alert and oriented x 3 today.   Head- normocephalic, atraumatic Eyes-  Sclera clear, conjunctiva pink Ears- hearing intact Oropharynx- clear Lungs- Clear to ausculation bilaterally, normal work of breathing Heart- Regular rate and rhythm, no murmurs, rubs or gallops, PMI not laterally displaced GI- soft, NT, ND, + BS Extremities- no clubbing, cyanosis, or edema  Wt Readings from Last 3 Encounters:  01/12/18 188 lb 12.8 oz (85.6 kg)  11/09/17 193 lb 9.6 oz (87.8 kg)  10/06/17 192 lb 9.6 oz (87.4 kg)    EKG tracing ordered today is personally reviewed and shows sinus rhythm  Assessment and Plan:  1. Persistent afib Doing ery well post ablation off AAD therapy chads2vasc score is 5.  Continue long term anticoagulation Reduce toprol to 50mg  daily due to fatigue   2. Overweight She has lost 4 lbs Ongoing lifestyle changes is encouraged  Return in 3 months in AF clinic Return to see me in 6 months  Thompson Grayer MD, [facility] 01/12/2018 2:28 PM

## 2018-03-04 DIAGNOSIS — Z79899 Other long term (current) drug therapy: Secondary | ICD-10-CM | POA: Diagnosis not present

## 2018-03-04 DIAGNOSIS — I482 Chronic atrial fibrillation, unspecified: Secondary | ICD-10-CM | POA: Diagnosis not present

## 2018-03-04 DIAGNOSIS — I5030 Unspecified diastolic (congestive) heart failure: Secondary | ICD-10-CM | POA: Diagnosis not present

## 2018-03-04 DIAGNOSIS — I10 Essential (primary) hypertension: Secondary | ICD-10-CM | POA: Diagnosis not present

## 2018-03-04 DIAGNOSIS — J449 Chronic obstructive pulmonary disease, unspecified: Secondary | ICD-10-CM | POA: Diagnosis not present

## 2018-03-15 ENCOUNTER — Encounter: Payer: Self-pay | Admitting: Internal Medicine

## 2018-03-22 ENCOUNTER — Other Ambulatory Visit: Payer: Self-pay | Admitting: Cardiology

## 2018-04-05 ENCOUNTER — Other Ambulatory Visit: Payer: Self-pay | Admitting: Cardiology

## 2018-04-14 ENCOUNTER — Encounter (HOSPITAL_COMMUNITY): Payer: Self-pay | Admitting: Nurse Practitioner

## 2018-04-14 ENCOUNTER — Ambulatory Visit (HOSPITAL_COMMUNITY)
Admission: RE | Admit: 2018-04-14 | Discharge: 2018-04-14 | Disposition: A | Discharge status: Home / Self Care | Payer: Medicare PPO | Source: Ambulatory Visit | Attending: Nurse Practitioner | Admitting: Nurse Practitioner

## 2018-04-14 VITALS — BP 144/74 | HR 127 | Ht 61.0 in | Wt 196.0 lb

## 2018-04-14 DIAGNOSIS — I110 Hypertensive heart disease with heart failure: Secondary | ICD-10-CM | POA: Insufficient documentation

## 2018-04-14 DIAGNOSIS — E785 Hyperlipidemia, unspecified: Secondary | ICD-10-CM | POA: Insufficient documentation

## 2018-04-14 DIAGNOSIS — I4819 Other persistent atrial fibrillation: Secondary | ICD-10-CM | POA: Insufficient documentation

## 2018-04-14 DIAGNOSIS — Z882 Allergy status to sulfonamides status: Secondary | ICD-10-CM | POA: Diagnosis not present

## 2018-04-14 DIAGNOSIS — Z7901 Long term (current) use of anticoagulants: Secondary | ICD-10-CM | POA: Diagnosis not present

## 2018-04-14 DIAGNOSIS — Z8249 Family history of ischemic heart disease and other diseases of the circulatory system: Secondary | ICD-10-CM | POA: Diagnosis not present

## 2018-04-14 DIAGNOSIS — Z79890 Hormone replacement therapy (postmenopausal): Secondary | ICD-10-CM | POA: Insufficient documentation

## 2018-04-14 DIAGNOSIS — E039 Hypothyroidism, unspecified: Secondary | ICD-10-CM | POA: Diagnosis not present

## 2018-04-14 DIAGNOSIS — I5032 Chronic diastolic (congestive) heart failure: Secondary | ICD-10-CM | POA: Insufficient documentation

## 2018-04-14 DIAGNOSIS — Z881 Allergy status to other antibiotic agents status: Secondary | ICD-10-CM | POA: Insufficient documentation

## 2018-04-14 DIAGNOSIS — Z79899 Other long term (current) drug therapy: Secondary | ICD-10-CM | POA: Insufficient documentation

## 2018-04-14 DIAGNOSIS — Z8673 Personal history of transient ischemic attack (TIA), and cerebral infarction without residual deficits: Secondary | ICD-10-CM | POA: Insufficient documentation

## 2018-04-14 DIAGNOSIS — Z87891 Personal history of nicotine dependence: Secondary | ICD-10-CM | POA: Diagnosis not present

## 2018-04-14 LAB — CBC
HCT: 39.8 % (ref 36.0–46.0)
HEMOGLOBIN: 12.4 g/dL (ref 12.0–15.0)
MCH: 31.2 pg (ref 26.0–34.0)
MCHC: 31.2 g/dL (ref 30.0–36.0)
MCV: 100 fL (ref 80.0–100.0)
Platelets: 254 10*3/uL (ref 150–400)
RBC: 3.98 MIL/uL (ref 3.87–5.11)
RDW: 14.9 % (ref 11.5–15.5)
WBC: 6.3 10*3/uL (ref 4.0–10.5)
nRBC: 0 % (ref 0.0–0.2)

## 2018-04-14 LAB — BASIC METABOLIC PANEL
Anion gap: 12 (ref 5–15)
BUN: 15 mg/dL (ref 8–23)
CHLORIDE: 103 mmol/L (ref 98–111)
CO2: 23 mmol/L (ref 22–32)
Calcium: 9.9 mg/dL (ref 8.9–10.3)
Creatinine, Ser: 0.97 mg/dL (ref 0.44–1.00)
GFR calc non Af Amer: 58 mL/min — ABNORMAL LOW (ref >60)
Glucose, Bld: 99 mg/dL (ref 70–99)
POTASSIUM: 3.7 mmol/L (ref 3.5–5.1)
SODIUM: 138 mmol/L (ref 135–145)

## 2018-04-14 NOTE — Progress Notes (Signed)
Primary Care Physician: Sinda Du, MD Referring Physician: Dr. Dionne Bucy is a 73 y.o. female with a h/o chronic diastolic heart failure, HTN, afib, previous TIA, that  Is in the afib clinic for f/u . She had an ablation 07/2017.    She is in afib today and has been in afib since the beginning of January. She feels the trigger was choking on a calcium tablet and she felt like she could not  get her air. She hit her chest hard trying to disloge the table and felt the afib come on shortly after that . The pill finally moved. She knew this appointment was coming up so she chose to just wait to further address the afib. Continues on anticoagulation without any missed doses.  She did increase her metoprolol dose.  Today, she denies symptoms of palpitations, chest pain, shortness of breath, orthopnea, PND, lower extremity edema, dizziness, presyncope, syncope, or neurologic sequela. The patient is tolerating medications without difficulties and is otherwise without complaint today.   Past Medical History:  Diagnosis Date  . Anxiety   . Atrial fibrillation ([place])   . Chronic diastolic heart failure ([employer])   . Depression   . Essential hypertension   . Hyperlipidemia   . Hypothyroidism   . Pneumonia   . TIA (transient ischemic attack)    Past Surgical History:  Procedure Laterality Date  . ABDOMINAL HYSTERECTOMY    . ABLATION OF DYSRHYTHMIC FOCUS  07/06/2017  . ATRIAL FIBRILLATION ABLATION N/A 07/06/2017   Procedure: ATRIAL FIBRILLATION ABLATION;  Surgeon: Thompson Grayer, MD;  Location: [facility];  Service: Cardiovascular;  Laterality: N/A;  . BREAST SURGERY     biopsy  . CARDIOVERSION N/A 01/23/2016   Procedure: CARDIOVERSION;  Surgeon: Satira Sark, MD;  [facility];  Service: Cardiovascular;  Laterality: N/A;  . CARDIOVERSION N/A 11/13/2016   Procedure: CARDIOVERSION;  Surgeon: Arnoldo Lenis, MD;  [facility];  Service: Endoscopy;   Laterality: N/A;  . CHOLECYSTECTOMY    . coloscopy    . TEE WITHOUT CARDIOVERSION  07/06/2017  . TEE WITHOUT CARDIOVERSION N/A 07/06/2017   Procedure: TRANSESOPHAGEAL ECHOCARDIOGRAM (TEE);  Surgeon: Josue Hector, MD;  Location: [facility];  Service: Cardiovascular;  Laterality: N/A;    Current Outpatient Medications  Medication Sig Dispense Refill  . acetaminophen (TYLENOL) 650 MG CR tablet Take 1,300 mg every 8 (eight) hours as needed by mouth for pain.    Marland Kitchen albuterol (PROVENTIL HFA;VENTOLIN HFA) 108 (90 BASE) MCG/ACT inhaler Inhale 2 puffs every 6 (six) hours as needed into the lungs for wheezing or shortness of breath.     . calcium carbonate (TUMS - DOSED IN MG ELEMENTAL CALCIUM) 500 MG chewable tablet Chew 4 tablets 2 (two) times daily as needed by mouth for indigestion or heartburn.    . Calcium Carbonate-Vitamin D (CALCIUM 600+D PO) Take 1 tablet by mouth daily.     Marland Kitchen diltiazem (CARDIZEM CD) 240 MG 24 hr capsule Take 1 capsule (240 mg total) by mouth daily. 90 capsule 3  . diltiazem (CARDIZEM) 30 MG tablet Take 1 tablet (30 mg total) by mouth 4 (four) times daily as needed (for atrial fibrillation). 60 tablet 3  . DULoxetine (CYMBALTA) 60 MG capsule Take 60 mg by mouth daily.    . ferrous sulfate 325 (65 FE) MG tablet Take 325 mg by mouth daily with breakfast.    . levothyroxine (SYNTHROID, LEVOTHROID) 137 MCG tablet Take  137 mcg by mouth daily before breakfast.     . lisinopril (PRINIVIL,ZESTRIL) 20 MG tablet Take 1 tablet (20 mg total) by mouth daily.    . magnesium oxide (MAG-OX) 400 MG tablet Take 400 mg by mouth daily.    . metoprolol succinate (TOPROL-XL) 50 MG 24 hr tablet Take 1 tablet (50 mg total) by mouth daily. Take with or immediately following a meal. (Patient taking differently: Take 100 mg by mouth daily. Take with or immediately following a meal. ) 90 tablet 3  . Multiple Vitamins-Minerals (CENTRUM SILVER PO) Take 1 tablet by mouth daily.    Marland Kitchen omeprazole (PRILOSEC)  40 MG capsule Take 40 mg daily by mouth.     . potassium chloride SA (K-DUR,KLOR-CON) 20 MEQ tablet Take 2 tablets (40 mEq total) by mouth daily. 180 tablet 3  . rizatriptan (MAXALT-MLT) 10 MG disintegrating tablet Take 10 mg by mouth as needed for migraine.     . torsemide (DEMADEX) 20 MG tablet TAKE 2 TABLETS BY MOUTH EVERY MORNING 180 tablet 1  . XARELTO 20 MG TABS tablet TAKE 1 TABLET BY MOUTH EVERY DAY WITH DINNER 30 tablet 11   No current facility-administered medications for this encounter.     Allergies  Allergen Reactions  . Levaquin [Levofloxacin] Other (See Comments)    Headache   . Sulfa Antibiotics Hives    Social History   Socioeconomic History  . Marital status: Married    Spouse name: Not on file  . Number of children: Not on file  . Years of education: Not on file  . Highest education level: Not on file  Occupational History  . Not on file  Social Needs  . Financial resource strain: Not on file  . Food insecurity:    Worry: Not on file    Inability: Not on file  . Transportation needs:    Medical: Not on file    Non-medical: Not on file  Tobacco Use  . Smoking status: Former Smoker    Packs/day: 0.50    Types: Cigarettes    Last attempt to quit: 01/22/1973    Years since quitting: 45.2  . Smokeless tobacco: Never Used  Substance and Sexual Activity  . Alcohol use: No    Alcohol/week: 0.0 standard drinks  . Drug use: No  . Sexual activity: Yes    Birth control/protection: Surgical  Lifestyle  . Physical activity:    Days per week: Not on file    Minutes per session: Not on file  . Stress: Not on file  Relationships  . Social connections:    Talks on phone: Not on file    Gets together: Not on file    Attends religious service: Not on file    Active member of club or organization: Not on file    Attends meetings of clubs or organizations: Not on file    Relationship status: Not on file  . Intimate partner violence:    Fear of current or ex  partner: Not on file    Emotionally abused: Not on file    Physically abused: Not on file    Forced sexual activity: Not on file  Other Topics Concern  . Not on file  Social History Narrative   Lives with spouse in [place] has ALS and is dependant on the patient for transition from bed to chair.    Family History  Problem Relation Age of Onset  . Hypertension Unknown   . Hypertension  Father   . Hypertension Mother   . Anemia Sister   . Hypertension Sister     ROS- All systems are reviewed and negative except as per the HPI above  Physical Exam: Vitals:   04/14/18 1413  BP: (!) 144/74  Pulse: (!) 127  Weight: 88.9 kg  Height: 5\' 1"  (1.549 m)   Wt Readings from Last 3 Encounters:  04/14/18 88.9 kg  01/12/18 85.6 kg  11/09/17 87.8 kg    Labs: Lab Results  Component Value Date   NA 138 07/06/2017   K 3.7 07/06/2017   CL 102 07/06/2017   CO2 28 07/06/2017   GLUCOSE 100 (H) 07/06/2017   BUN 16 07/06/2017   CREATININE 1.13 (H) 07/06/2017   CALCIUM 9.8 07/06/2017   MG 2.1 12/29/2013   Lab Results  Component Value Date   INR 1.31 01/22/2016   No results found for: CHOL, HDL, LDLCALC, TRIG   GEN- The patient is well appearing, alert and oriented x 3 today.   Head- normocephalic, atraumatic Eyes-  Sclera clear, conjunctiva pink Ears- hearing intact Oropharynx- clear Neck- supple, no JVP Lymph- no cervical lymphadenopathy Lungs- Clear to ausculation bilaterally, normal work of breathing Heart- irregular rate and rhythm, no murmurs, rubs or gallops, PMI not laterally displaced GI- soft, NT, ND, + BS Extremities- no clubbing, cyanosis, or edema MS- no significant deformity or atrophy Skin- no rash or lesion Psych- euthymic mood, full affect Neuro- strength and sensation are intact  EKG-afib at 127 ms  Epic records reviewed    Assessment and Plan: 1. Persistent afib S/p ablation 07/2017 In persistent afib since early January Trigger may  have been choking on a potassium tablet Continue metorpolol succinate 100 mg daily  Continue Cardizem 240 mg daily  Continue xarelto 20 mg daily for chadsvasc score of  at least 6, states no missed doses for the last 3 weeks Will plan on cardioversion, pt is in agreement Cbc/bmet today  2. HTN  Stable  3. HF Fluid status stable  F/u here one week after cardioversion  Butch Penny C. Belynda Pagaduan, [facility] [address] [place], [place] [address] [phone]

## 2018-04-14 NOTE — H&P (View-Only) (Signed)
Primary Care Physician: Sinda Du, MD Referring Physician: Dr. Dionne Bucy is a 73 y.o. female with a h/o chronic diastolic heart failure, HTN, afib, previous TIA, that  Is in the afib clinic for f/u . She had an ablation 07/2017.    She is in afib today and has been in afib since the beginning of January. She feels the trigger was choking on a calcium tablet and she felt like she could not  get her air. She hit her chest hard trying to disloge the table and felt the afib come on shortly after that . The pill finally moved. She knew this appointment was coming up so she chose to just wait to further address the afib. Continues on anticoagulation without any missed doses.  She did increase her metoprolol dose.  Today, she denies symptoms of palpitations, chest pain, shortness of breath, orthopnea, PND, lower extremity edema, dizziness, presyncope, syncope, or neurologic sequela. The patient is tolerating medications without difficulties and is otherwise without complaint today.   Past Medical History:  Diagnosis Date  . Anxiety   . Atrial fibrillation ([place])   . Chronic diastolic heart failure ([place])   . Depression   . Essential hypertension   . Hyperlipidemia   . Hypothyroidism   . Pneumonia   . TIA (transient ischemic attack)    Past Surgical History:  Procedure Laterality Date  . ABDOMINAL HYSTERECTOMY    . ABLATION OF DYSRHYTHMIC FOCUS  07/06/2017  . ATRIAL FIBRILLATION ABLATION N/A 07/06/2017   Procedure: ATRIAL FIBRILLATION ABLATION;  Surgeon: Thompson Grayer, MD;  Location: [facility];  Service: Cardiovascular;  Laterality: N/A;  . BREAST SURGERY     biopsy  . CARDIOVERSION N/A 01/23/2016   Procedure: CARDIOVERSION;  Surgeon: Satira Sark, MD;  [facility];  Service: Cardiovascular;  Laterality: N/A;  . CARDIOVERSION N/A 11/13/2016   Procedure: CARDIOVERSION;  Surgeon: Arnoldo Lenis, MD;  [facility];  Service: Endoscopy;   Laterality: N/A;  . CHOLECYSTECTOMY    . coloscopy    . TEE WITHOUT CARDIOVERSION  07/06/2017  . TEE WITHOUT CARDIOVERSION N/A 07/06/2017   Procedure: TRANSESOPHAGEAL ECHOCARDIOGRAM (TEE);  Surgeon: Josue Hector, MD;  Location: [facility];  Service: Cardiovascular;  Laterality: N/A;    Current Outpatient Medications  Medication Sig Dispense Refill  . acetaminophen (TYLENOL) 650 MG CR tablet Take 1,300 mg every 8 (eight) hours as needed by mouth for pain.    Marland Kitchen albuterol (PROVENTIL HFA;VENTOLIN HFA) 108 (90 BASE) MCG/ACT inhaler Inhale 2 puffs every 6 (six) hours as needed into the lungs for wheezing or shortness of breath.     . calcium carbonate (TUMS - DOSED IN MG ELEMENTAL CALCIUM) 500 MG chewable tablet Chew 4 tablets 2 (two) times daily as needed by mouth for indigestion or heartburn.    . Calcium Carbonate-Vitamin D (CALCIUM 600+D PO) Take 1 tablet by mouth daily.     Marland Kitchen diltiazem (CARDIZEM CD) 240 MG 24 hr capsule Take 1 capsule (240 mg total) by mouth daily. 90 capsule 3  . diltiazem (CARDIZEM) 30 MG tablet Take 1 tablet (30 mg total) by mouth 4 (four) times daily as needed (for atrial fibrillation). 60 tablet 3  . DULoxetine (CYMBALTA) 60 MG capsule Take 60 mg by mouth daily.    . ferrous sulfate 325 (65 FE) MG tablet Take 325 mg by mouth daily with breakfast.    . levothyroxine (SYNTHROID, LEVOTHROID) 137 MCG tablet Take  137 mcg by mouth daily before breakfast.     . lisinopril (PRINIVIL,ZESTRIL) 20 MG tablet Take 1 tablet (20 mg total) by mouth daily.    . magnesium oxide (MAG-OX) 400 MG tablet Take 400 mg by mouth daily.    . metoprolol succinate (TOPROL-XL) 50 MG 24 hr tablet Take 1 tablet (50 mg total) by mouth daily. Take with or immediately following a meal. (Patient taking differently: Take 100 mg by mouth daily. Take with or immediately following a meal. ) 90 tablet 3  . Multiple Vitamins-Minerals (CENTRUM SILVER PO) Take 1 tablet by mouth daily.    Marland Kitchen omeprazole (PRILOSEC)  40 MG capsule Take 40 mg daily by mouth.     . potassium chloride SA (K-DUR,KLOR-CON) 20 MEQ tablet Take 2 tablets (40 mEq total) by mouth daily. 180 tablet 3  . rizatriptan (MAXALT-MLT) 10 MG disintegrating tablet Take 10 mg by mouth as needed for migraine.     . torsemide (DEMADEX) 20 MG tablet TAKE 2 TABLETS BY MOUTH EVERY MORNING 180 tablet 1  . XARELTO 20 MG TABS tablet TAKE 1 TABLET BY MOUTH EVERY DAY WITH DINNER 30 tablet 11   No current facility-administered medications for this encounter.     Allergies  Allergen Reactions  . Levaquin [Levofloxacin] Other (See Comments)    Headache   . Sulfa Antibiotics Hives    Social History   Socioeconomic History  . Marital status: Married    Spouse name: Not on file  . Number of children: Not on file  . Years of education: Not on file  . Highest education level: Not on file  Occupational History  . Not on file  Social Needs  . Financial resource strain: Not on file  . Food insecurity:    Worry: Not on file    Inability: Not on file  . Transportation needs:    Medical: Not on file    Non-medical: Not on file  Tobacco Use  . Smoking status: Former Smoker    Packs/day: 0.50    Types: Cigarettes    Last attempt to quit: 01/22/1973    Years since quitting: 45.2  . Smokeless tobacco: Never Used  Substance and Sexual Activity  . Alcohol use: No    Alcohol/week: 0.0 standard drinks  . Drug use: No  . Sexual activity: Yes    Birth control/protection: Surgical  Lifestyle  . Physical activity:    Days per week: Not on file    Minutes per session: Not on file  . Stress: Not on file  Relationships  . Social connections:    Talks on phone: Not on file    Gets together: Not on file    Attends religious service: Not on file    Active member of club or organization: Not on file    Attends meetings of clubs or organizations: Not on file    Relationship status: Not on file  . Intimate partner violence:    Fear of current or ex  partner: Not on file    Emotionally abused: Not on file    Physically abused: Not on file    Forced sexual activity: Not on file  Other Topics Concern  . Not on file  Social History Narrative   Lives with spouse in [place] has ALS and is dependant on the patient for transition from bed to chair.    Family History  Problem Relation Age of Onset  . Hypertension Unknown   . Hypertension  Father   . Hypertension Mother   . Anemia Sister   . Hypertension Sister     ROS- All systems are reviewed and negative except as per the HPI above  Physical Exam: Vitals:   04/14/18 1413  BP: (!) 144/74  Pulse: (!) 127  Weight: 88.9 kg  Height: 5\' 1"  (1.549 m)   Wt Readings from Last 3 Encounters:  04/14/18 88.9 kg  01/12/18 85.6 kg  11/09/17 87.8 kg    Labs: Lab Results  Component Value Date   NA 138 07/06/2017   K 3.7 07/06/2017   CL 102 07/06/2017   CO2 28 07/06/2017   GLUCOSE 100 (H) 07/06/2017   BUN 16 07/06/2017   CREATININE 1.13 (H) 07/06/2017   CALCIUM 9.8 07/06/2017   MG 2.1 12/29/2013   Lab Results  Component Value Date   INR 1.31 01/22/2016   No results found for: CHOL, HDL, LDLCALC, TRIG   GEN- The patient is well appearing, alert and oriented x 3 today.   Head- normocephalic, atraumatic Eyes-  Sclera clear, conjunctiva pink Ears- hearing intact Oropharynx- clear Neck- supple, no JVP Lymph- no cervical lymphadenopathy Lungs- Clear to ausculation bilaterally, normal work of breathing Heart- irregular rate and rhythm, no murmurs, rubs or gallops, PMI not laterally displaced GI- soft, NT, ND, + BS Extremities- no clubbing, cyanosis, or edema MS- no significant deformity or atrophy Skin- no rash or lesion Psych- euthymic mood, full affect Neuro- strength and sensation are intact  EKG-afib at 127 ms  Epic records reviewed    Assessment and Plan: 1. Persistent afib S/p ablation 07/2017 In persistent afib since early January Trigger may  have been choking on a potassium tablet Continue metorpolol succinate 100 mg daily  Continue Cardizem 240 mg daily  Continue xarelto 20 mg daily for chadsvasc score of  at least 6, states no missed doses for the last 3 weeks Will plan on cardioversion, pt is in agreement Cbc/bmet today  2. HTN  Stable  3. HF Fluid status stable  F/u here one week after cardioversion  Butch Penny C. [place], [facility] [address] [place], [place] [address] [phone]

## 2018-04-15 ENCOUNTER — Telehealth (HOSPITAL_COMMUNITY): Payer: Self-pay | Admitting: *Deleted

## 2018-04-15 ENCOUNTER — Other Ambulatory Visit (HOSPITAL_COMMUNITY): Payer: Self-pay | Admitting: *Deleted

## 2018-04-15 NOTE — Telephone Encounter (Signed)
Patient scheduled for cardioversion 2/12 at 2pm with Dr. Margaretann Loveless. Pt NPO after MN except sip of water with morning medications. Instructed on no missed doses of Xarelto. Must have ride home for procedure. Follow up appt made. Pt verbalized understanding of all instructions.

## 2018-04-20 ENCOUNTER — Emergency Department (HOSPITAL_COMMUNITY): Payer: Medicare PPO

## 2018-04-20 ENCOUNTER — Encounter (HOSPITAL_COMMUNITY): Payer: Self-pay | Admitting: *Deleted

## 2018-04-20 ENCOUNTER — Ambulatory Visit (HOSPITAL_BASED_OUTPATIENT_CLINIC_OR_DEPARTMENT_OTHER)
Admission: RE | Admit: 2018-04-20 | Discharge: 2018-04-20 | Disposition: A | Discharge status: Home / Self Care | Payer: Medicare PPO | Source: Home / Self Care | Attending: Internal Medicine | Admitting: Internal Medicine

## 2018-04-20 ENCOUNTER — Inpatient Hospital Stay (HOSPITAL_COMMUNITY)
Admission: EM | Admit: 2018-04-20 | Discharge: 2018-04-22 | DRG: 292 | Disposition: A | Discharge status: Home / Self Care | Payer: Medicare PPO | Attending: Pulmonary Disease | Admitting: Pulmonary Disease

## 2018-04-20 ENCOUNTER — Ambulatory Visit (HOSPITAL_COMMUNITY): Payer: Medicare PPO | Admitting: Anesthesiology

## 2018-04-20 ENCOUNTER — Encounter (HOSPITAL_COMMUNITY): Payer: Self-pay | Admitting: Emergency Medicine

## 2018-04-20 ENCOUNTER — Encounter (HOSPITAL_COMMUNITY)
Admission: RE | Disposition: A | Discharge status: Home / Self Care | Payer: Self-pay | Source: Home / Self Care | Attending: Internal Medicine

## 2018-04-20 ENCOUNTER — Other Ambulatory Visit: Payer: Self-pay

## 2018-04-20 DIAGNOSIS — Z7901 Long term (current) use of anticoagulants: Secondary | ICD-10-CM | POA: Diagnosis not present

## 2018-04-20 DIAGNOSIS — F419 Anxiety disorder, unspecified: Secondary | ICD-10-CM | POA: Insufficient documentation

## 2018-04-20 DIAGNOSIS — I5032 Chronic diastolic (congestive) heart failure: Secondary | ICD-10-CM

## 2018-04-20 DIAGNOSIS — E039 Hypothyroidism, unspecified: Secondary | ICD-10-CM | POA: Diagnosis present

## 2018-04-20 DIAGNOSIS — E785 Hyperlipidemia, unspecified: Secondary | ICD-10-CM

## 2018-04-20 DIAGNOSIS — E0590 Thyrotoxicosis, unspecified without thyrotoxic crisis or storm: Secondary | ICD-10-CM | POA: Diagnosis present

## 2018-04-20 DIAGNOSIS — T501X6A Underdosing of loop [high-ceiling] diuretics, initial encounter: Secondary | ICD-10-CM | POA: Diagnosis present

## 2018-04-20 DIAGNOSIS — Z79890 Hormone replacement therapy (postmenopausal): Secondary | ICD-10-CM | POA: Diagnosis not present

## 2018-04-20 DIAGNOSIS — I110 Hypertensive heart disease with heart failure: Secondary | ICD-10-CM | POA: Diagnosis present

## 2018-04-20 DIAGNOSIS — I4819 Other persistent atrial fibrillation: Secondary | ICD-10-CM | POA: Diagnosis present

## 2018-04-20 DIAGNOSIS — Z87891 Personal history of nicotine dependence: Secondary | ICD-10-CM | POA: Diagnosis not present

## 2018-04-20 DIAGNOSIS — I5033 Acute on chronic diastolic (congestive) heart failure: Secondary | ICD-10-CM | POA: Diagnosis present

## 2018-04-20 DIAGNOSIS — Z881 Allergy status to other antibiotic agents status: Secondary | ICD-10-CM | POA: Diagnosis not present

## 2018-04-20 DIAGNOSIS — R0902 Hypoxemia: Secondary | ICD-10-CM | POA: Diagnosis present

## 2018-04-20 DIAGNOSIS — Z882 Allergy status to sulfonamides status: Secondary | ICD-10-CM

## 2018-04-20 DIAGNOSIS — Z8673 Personal history of transient ischemic attack (TIA), and cerebral infarction without residual deficits: Secondary | ICD-10-CM

## 2018-04-20 DIAGNOSIS — Z79899 Other long term (current) drug therapy: Secondary | ICD-10-CM

## 2018-04-20 DIAGNOSIS — I5020 Unspecified systolic (congestive) heart failure: Secondary | ICD-10-CM

## 2018-04-20 DIAGNOSIS — I480 Paroxysmal atrial fibrillation: Secondary | ICD-10-CM | POA: Diagnosis present

## 2018-04-20 DIAGNOSIS — I5031 Acute diastolic (congestive) heart failure: Secondary | ICD-10-CM | POA: Diagnosis not present

## 2018-04-20 DIAGNOSIS — I5043 Acute on chronic combined systolic (congestive) and diastolic (congestive) heart failure: Secondary | ICD-10-CM | POA: Diagnosis present

## 2018-04-20 DIAGNOSIS — I509 Heart failure, unspecified: Secondary | ICD-10-CM | POA: Diagnosis not present

## 2018-04-20 DIAGNOSIS — I10 Essential (primary) hypertension: Secondary | ICD-10-CM | POA: Diagnosis not present

## 2018-04-20 DIAGNOSIS — F329 Major depressive disorder, single episode, unspecified: Secondary | ICD-10-CM

## 2018-04-20 DIAGNOSIS — I4891 Unspecified atrial fibrillation: Secondary | ICD-10-CM | POA: Diagnosis not present

## 2018-04-20 DIAGNOSIS — Z8249 Family history of ischemic heart disease and other diseases of the circulatory system: Secondary | ICD-10-CM

## 2018-04-20 HISTORY — PX: CARDIOVERSION: SHX1299

## 2018-04-20 LAB — TROPONIN I

## 2018-04-20 LAB — COMPREHENSIVE METABOLIC PANEL
ALT: 28 U/L (ref 0–44)
AST: 33 U/L (ref 15–41)
Albumin: 4.3 g/dL (ref 3.5–5.0)
Alkaline Phosphatase: 125 U/L (ref 38–126)
Anion gap: 8 (ref 5–15)
BUN: 15 mg/dL (ref 8–23)
CHLORIDE: 104 mmol/L (ref 98–111)
CO2: 24 mmol/L (ref 22–32)
Calcium: 9.7 mg/dL (ref 8.9–10.3)
Creatinine, Ser: 0.78 mg/dL (ref 0.44–1.00)
GFR calc Af Amer: >60 mL/min (ref >60)
GFR calc non Af Amer: >60 mL/min (ref >60)
Glucose, Bld: 97 mg/dL (ref 70–99)
Potassium: 3.6 mmol/L (ref 3.5–5.1)
Sodium: 136 mmol/L (ref 135–145)
Total Bilirubin: 1 mg/dL (ref 0.3–1.2)
Total Protein: 7.7 g/dL (ref 6.5–8.1)

## 2018-04-20 LAB — BRAIN NATRIURETIC PEPTIDE: B Natriuretic Peptide: 252 pg/mL — ABNORMAL HIGH (ref 0.0–100.0)

## 2018-04-20 LAB — CBC WITH DIFFERENTIAL/PLATELET
Abs Immature Granulocytes: 0.03 10*3/uL (ref 0.00–0.07)
Basophils Absolute: 0 10*3/uL (ref 0.0–0.1)
Basophils Relative: 1 %
EOS ABS: 0.1 10*3/uL (ref 0.0–0.5)
Eosinophils Relative: 1 %
HCT: 41.5 % (ref 36.0–46.0)
Hemoglobin: 12.8 g/dL (ref 12.0–15.0)
IMMATURE GRANULOCYTES: 0 %
Lymphocytes Relative: 20 %
Lymphs Abs: 1.5 10*3/uL (ref 0.7–4.0)
MCH: 31.2 pg (ref 26.0–34.0)
MCHC: 30.8 g/dL (ref 30.0–36.0)
MCV: 101.2 fL — ABNORMAL HIGH (ref 80.0–100.0)
Monocytes Absolute: 0.3 10*3/uL (ref 0.1–1.0)
Monocytes Relative: 4 %
NEUTROS PCT: 74 %
Neutro Abs: 5.4 10*3/uL (ref 1.7–7.7)
Platelets: 291 10*3/uL (ref 150–400)
RBC: 4.1 MIL/uL (ref 3.87–5.11)
RDW: 15.4 % (ref 11.5–15.5)
WBC: 7.4 10*3/uL (ref 4.0–10.5)
nRBC: 0 % (ref 0.0–0.2)

## 2018-04-20 SURGERY — CARDIOVERSION
Anesthesia: General

## 2018-04-20 MED ORDER — LIDOCAINE HCL (CARDIAC) PF 100 MG/5ML IV SOSY
PREFILLED_SYRINGE | INTRAVENOUS | Status: DC | PRN
  Administered 2018-04-20: 100 mg via INTRAVENOUS

## 2018-04-20 MED ORDER — METOPROLOL SUCCINATE ER 25 MG PO TB24
100.0000 mg | ORAL_TABLET | Freq: Every day | ORAL | Status: DC
  Administered 2018-04-21 – 2018-04-22 (×2): 100 mg via ORAL
  Filled 2018-04-20 (×2): qty 4

## 2018-04-20 MED ORDER — LEVOTHYROXINE SODIUM 137 MCG PO TABS
137.0000 ug | ORAL_TABLET | Freq: Every day | ORAL | Status: DC
  Administered 2018-04-21 – 2018-04-22 (×2): 137 ug via ORAL
  Filled 2018-04-20 (×2): qty 1

## 2018-04-20 MED ORDER — DULOXETINE HCL 30 MG PO CPEP
60.0000 mg | ORAL_CAPSULE | Freq: Every day | ORAL | Status: DC
  Administered 2018-04-21 – 2018-04-22 (×2): 60 mg via ORAL
  Filled 2018-04-20 (×2): qty 2

## 2018-04-20 MED ORDER — PROPOFOL 10 MG/ML IV BOLUS
INTRAVENOUS | Status: DC | PRN
  Administered 2018-04-20: 60 mg via INTRAVENOUS

## 2018-04-20 MED ORDER — POTASSIUM CHLORIDE CRYS ER 20 MEQ PO TBCR
40.0000 meq | EXTENDED_RELEASE_TABLET | Freq: Every day | ORAL | Status: DC
  Administered 2018-04-21 – 2018-04-22 (×2): 40 meq via ORAL
  Filled 2018-04-20 (×2): qty 2

## 2018-04-20 MED ORDER — SODIUM CHLORIDE 0.9% FLUSH: 3.0000 mL | INTRAVENOUS | Status: DC | PRN

## 2018-04-20 MED ORDER — DILTIAZEM HCL ER COATED BEADS 240 MG PO CP24
240.0000 mg | ORAL_CAPSULE | Freq: Every day | ORAL | Status: DC
  Administered 2018-04-21 – 2018-04-22 (×2): 240 mg via ORAL
  Filled 2018-04-20 (×2): qty 1

## 2018-04-20 MED ORDER — SODIUM CHLORIDE 0.9 % IV SOLN
INTRAVENOUS | Status: DC
  Administered 2018-04-20 (×2): via INTRAVENOUS

## 2018-04-20 MED ORDER — ONDANSETRON HCL 4 MG/2ML IJ SOLN: 4.0000 mg | Freq: Four times a day (QID) | INTRAMUSCULAR | Status: DC | PRN

## 2018-04-20 MED ORDER — FUROSEMIDE 10 MG/ML IJ SOLN
40.0000 mg | Freq: Two times a day (BID) | INTRAMUSCULAR | Status: DC
  Administered 2018-04-21 – 2018-04-22 (×3): 40 mg via INTRAVENOUS
  Filled 2018-04-20 (×3): qty 4

## 2018-04-20 MED ORDER — FUROSEMIDE 10 MG/ML IJ SOLN
40.0000 mg | Freq: Once | INTRAMUSCULAR | Status: AC
  Administered 2018-04-20: 40 mg via INTRAVENOUS
  Filled 2018-04-20: qty 4

## 2018-04-20 MED ORDER — ACETAMINOPHEN 325 MG PO TABS
650.0000 mg | ORAL_TABLET | ORAL | Status: DC | PRN
  Administered 2018-04-21 – 2018-04-22 (×2): 650 mg via ORAL
  Filled 2018-04-20 (×2): qty 2

## 2018-04-20 MED ORDER — ORAL CARE MOUTH RINSE
15.0000 mL | Freq: Two times a day (BID) | OROMUCOSAL | Status: DC
  Administered 2018-04-20 – 2018-04-21 (×3): 15 mL via OROMUCOSAL

## 2018-04-20 MED ORDER — SODIUM CHLORIDE 0.9 % IV SOLN: 250.0000 mL | INTRAVENOUS | Status: DC | PRN

## 2018-04-20 MED ORDER — SODIUM CHLORIDE 0.9% FLUSH
3.0000 mL | Freq: Two times a day (BID) | INTRAVENOUS | Status: DC
  Administered 2018-04-20 – 2018-04-21 (×3): 3 mL via INTRAVENOUS

## 2018-04-20 MED ORDER — LISINOPRIL 10 MG PO TABS
20.0000 mg | ORAL_TABLET | Freq: Every day | ORAL | Status: DC
  Administered 2018-04-21 – 2018-04-22 (×2): 20 mg via ORAL
  Filled 2018-04-20 (×2): qty 2

## 2018-04-20 MED ORDER — RIVAROXABAN 20 MG PO TABS
20.0000 mg | ORAL_TABLET | Freq: Every day | ORAL | Status: DC
  Administered 2018-04-20 – 2018-04-21 (×2): 20 mg via ORAL
  Filled 2018-04-20 (×2): qty 1

## 2018-04-20 NOTE — Anesthesia Postprocedure Evaluation (Signed)
Anesthesia Post Note  Patient: Diane Cohen  Procedure(s) Performed: CARDIOVERSION (N/A )     Patient location during evaluation: PACU Anesthesia Type: General Level of consciousness: sedated and patient cooperative Pain management: pain level controlled Vital Signs Assessment: post-procedure vital signs reviewed and stable Respiratory status: spontaneous breathing Cardiovascular status: stable Anesthetic complications: no    Last Vitals:  Vitals:   04/20/18 1440 04/20/18 1450  BP:  (!) 147/74  Pulse: 78 78  Resp: 20 (!) 23  Temp:    SpO2: 92% 92%    Last Pain:  Vitals:   04/20/18 1450  TempSrc:   PainSc: 0-No pain                 Nolon Nations

## 2018-04-20 NOTE — Addendum Note (Signed)
Addendum  created 04/20/18 1821 by Nolon Nations, MD   Intraprocedure Staff edited

## 2018-04-20 NOTE — Anesthesia Preprocedure Evaluation (Signed)
Anesthesia Evaluation  Patient identified by MRN, date of birth, ID band Patient awake    Reviewed: Allergy & Precautions, NPO status , Patient's Chart, lab work & pertinent test results  Airway Mallampati: II  TM Distance: >3 FB Neck ROM: Full    Dental  (+) Dental Advisory Given   Pulmonary pneumonia, COPD, former smoker,    Pulmonary exam normal breath sounds clear to auscultation       Cardiovascular Exercise Tolerance: Good hypertension, Pt. on home beta blockers and Pt. on medications  Rhythm:Irregular Rate:Normal  Echo 06/2017 - Left ventricle: Systolic function was normal. The estimated ejection fraction was in the range of 55% to 60%. - Aortic valve: There was trivial regurgitation. - Mitral valve: There was mild to moderate regurgitation. - Left atrium: The atrium was moderately dilated. No evidence of thrombus in the atrial cavity or appendage. - Right atrium: No evidence of thrombus in the atrial cavity or appendage. - Atrial septum: No defect or patent foramen ovale was identified. - Impressions: Shadowing of LAA from coumadin ridge. Mild spontaneous contrast in LA No LAA thrombus noted after definity injection.  Impressions: - Shadowing of LAA from coumadin ridge. Mild spontaneous contrast in LA No LAA thrombus noted after definity injection   Neuro/Psych PSYCHIATRIC DISORDERS Anxiety Depression TIA   GI/Hepatic negative GI ROS, Neg liver ROS,   Endo/Other  Hypothyroidism   Renal/GU negative Renal ROS     Musculoskeletal   Abdominal   Peds  Hematology  (+) anemia ,   Anesthesia Other Findings   Reproductive/Obstetrics                             Lab Results  Component Value Date   WBC 6.3 04/14/2018   HGB 12.4 04/14/2018   HCT 39.8 04/14/2018   MCV 100.0 04/14/2018   PLT 254 04/14/2018    Anesthesia Physical  Anesthesia Plan  ASA: III  Anesthesia Plan: General    Post-op Pain Management:    Induction: Intravenous  PONV Risk Score and Plan: Treatment may vary due to age or medical condition and TIVA  Airway Management Planned: Mask  Additional Equipment:   Intra-op Plan:   Post-operative Plan:   Informed Consent: I have reviewed the patients History and Physical, chart, labs and discussed the procedure including the risks, benefits and alternatives for the proposed anesthesia with the patient or authorized representative who has indicated his/her understanding and acceptance.     Dental advisory given  Plan Discussed with: CRNA  Anesthesia Plan Comments:         Anesthesia Quick Evaluation

## 2018-04-20 NOTE — CV Procedure (Signed)
Procedure: Electrical Cardioversion Indications:  Atrial Fibrillation  Procedure Details:  Consent: Risks of procedure as well as the alternatives and risks of each were explained to the (patient/caregiver).  Consent for procedure obtained.  Time Out: Verified patient identification, verified procedure, site/side was marked, verified correct patient position, special equipment/implants available, medications/allergies/relevent history reviewed, required imaging and test results available. PERFORMED.  Patient placed on cardiac monitor, pulse oximetry, supplemental oxygen as necessary.  Sedation given: per anesthesia - propofol Pacer pads placed anterior and posterior chest.  Cardioverted 1 time(s).  Cardioversion with synchronized biphasic 120J shock.  Evaluation: Findings: Post procedure EKG shows: NSR Complications: None Patient did tolerate procedure well.  Time Spent Directly with the Patient:  30 minutes   [name] 04/20/2018, 2:31 PM

## 2018-04-20 NOTE — ED Provider Notes (Signed)
[name] EMERGENCY DEPARTMENT Provider Note   CSN: [id number] Arrival date & time: 04/20/18  1557     History   Chief Complaint Chief Complaint  Patient presents with  . Shortness of Breath    HPI Diane Cohen is a 73 y.o. female.  Patient complains of shortness of breath.  Patient states she has been getting more more shortness of breath over the last couple days.  She has not been taking her Lasix properly.  Patient was in A. fib and had cardioversion today and is now on normal sinus but her shortness of breath has become worse  The history is provided by the patient. No language interpreter was used.  Shortness of Breath  Severity:  Moderate Onset quality:  Sudden Timing:  Constant Progression:  Worsening Chronicity:  Recurrent Context: activity   Relieved by:  Nothing Worsened by:  Nothing Ineffective treatments:  None tried Associated symptoms: no abdominal pain, no chest pain, no cough, no headaches and no rash     Past Medical History:  Diagnosis Date  . Anxiety   . Atrial fibrillation ([place])   . Chronic diastolic heart failure ([place])   . Depression   . Essential hypertension   . Hyperlipidemia   . Hypothyroidism   . Pneumonia   . TIA (transient ischemic attack)     Patient Active Problem List   Diagnosis Date Noted  . Acute on chronic diastolic CHF (congestive heart failure) ([place]) 04/20/2018  . Swelling of limb 11/09/2017  . Chronic diastolic heart failure ([place]) 12/29/2013  . Anemia 09/20/2013  . Bilateral leg edema 08/29/2013  . Essential hypertension, benign   . Persistent atrial fibrillation     Past Surgical History:  Procedure Laterality Date  . ABDOMINAL HYSTERECTOMY    . ABLATION OF DYSRHYTHMIC FOCUS  07/06/2017  . ATRIAL FIBRILLATION ABLATION N/A 07/06/2017   Procedure: ATRIAL FIBRILLATION ABLATION;  Surgeon: Thompson Grayer, MD;  Location: [place] CV LAB;  Service: Cardiovascular;  Laterality: N/A;  . BREAST SURGERY     biopsy  .  CARDIOVERSION N/A 01/23/2016   Procedure: CARDIOVERSION;  Surgeon: Satira Sark, MD;  [facility];  Service: Cardiovascular;  Laterality: N/A;  . CARDIOVERSION N/A 11/13/2016   Procedure: CARDIOVERSION;  Surgeon: Arnoldo Lenis, MD;  Location: [facility];  Service: Endoscopy;  Laterality: N/A;  . CHOLECYSTECTOMY    . coloscopy    . TEE WITHOUT CARDIOVERSION  07/06/2017  . TEE WITHOUT CARDIOVERSION N/A 07/06/2017   Procedure: TRANSESOPHAGEAL ECHOCARDIOGRAM (TEE);  Surgeon: Josue Hector, MD;  Location: [facility] ENDOSCOPY;  Service: Cardiovascular;  Laterality: N/A;     OB History   No obstetric history on file.      Home Medications    Prior to Admission medications   Medication Sig Start Date End Date Taking? Authorizing Provider  acetaminophen (TYLENOL) 650 MG CR tablet Take 1,300 mg every 8 (eight) hours as needed by mouth for pain.    [provider]  albuterol (PROVENTIL HFA;VENTOLIN HFA) 108 (90 BASE) MCG/ACT inhaler Inhale 2 puffs every 6 (six) hours as needed into the lungs for wheezing or shortness of breath.     [provider]  calcium carbonate (TUMS - DOSED IN MG ELEMENTAL CALCIUM) 500 MG chewable tablet Chew 4 tablets 2 (two) times daily as needed by mouth for indigestion or heartburn.    [provider]  Calcium Carbonate-Vitamin D (CALTRATE 600+D PO) Take 1 tablet by mouth daily.  [provider]  diltiazem (CARDIZEM CD) 240 MG 24 hr capsule Take 1 capsule (240 mg total) by mouth daily. 04/15/17   Satira Sark, MD  diltiazem (CARDIZEM) 30 MG tablet Take 1 tablet (30 mg total) by mouth 4 (four) times daily as needed (for atrial fibrillation). 10/06/17   Allred, Jeneen Rinks, MD  DULoxetine (CYMBALTA) 60 MG capsule Take 60 mg by mouth daily.    [provider]  ferrous sulfate 325 (65 FE) MG tablet Take 325 mg by mouth daily with breakfast.    [provider]  levothyroxine (SYNTHROID, LEVOTHROID) 137 MCG  tablet Take 137 mcg by mouth daily before breakfast.  11/04/16   [provider]  lisinopril (PRINIVIL,ZESTRIL) 20 MG tablet Take 1 tablet (20 mg total) by mouth daily. 04/05/13   Rai, Ripudeep K, MD  magnesium oxide (MAG-OX) 400 MG tablet Take 400 mg by mouth daily.    [provider]  metoprolol succinate (TOPROL-XL) 100 MG 24 hr tablet Take 100 mg by mouth daily. 04/06/18   [provider]  Multiple Vitamin (MULTIVITAMIN WITH MINERALS) TABS tablet Take 1 tablet by mouth every evening. Centrum Silver for Adults 50+    [provider]  omeprazole (PRILOSEC) 40 MG capsule Take 40 mg daily by mouth.  11/14/15   [provider]  potassium chloride SA (K-DUR,KLOR-CON) 20 MEQ tablet Take 2 tablets (40 mEq total) by mouth daily. 08/13/15   Lendon Colonel, NP  rizatriptan (MAXALT-MLT) 10 MG disintegrating tablet Take 10 mg by mouth as needed for migraine.     [provider]  torsemide (DEMADEX) 20 MG tablet TAKE 2 TABLETS BY MOUTH EVERY MORNING Patient taking differently: Take 40 mg by mouth daily.  04/05/18   Satira Sark, MD  XARELTO 20 MG TABS tablet TAKE 1 TABLET BY MOUTH EVERY DAY WITH DINNER Patient taking differently: Take 20 mg by mouth daily with supper.  03/22/18   Satira Sark, MD    Family History Family History  Problem Relation Age of Onset  . Hypertension Other   . Hypertension Father   . Hypertension Mother   . Anemia Sister   . Hypertension Sister     Social History Social History   Tobacco Use  . Smoking status: Former Smoker    Packs/day: 0.50    Types: Cigarettes    Last attempt to quit: 01/22/1973    Years since quitting: 45.2  . Smokeless tobacco: Never Used  Substance Use Topics  . Alcohol use: No    Alcohol/week: 0.0 standard drinks  . Drug use: No     Allergies   Levaquin [levofloxacin] and Sulfa antibiotics   Review of Systems Review of Systems  Constitutional: Negative for appetite  change and fatigue.  HENT: Negative for congestion, ear discharge and sinus pressure.   Eyes: Negative for discharge.  Respiratory: Positive for shortness of breath. Negative for cough.   Cardiovascular: Negative for chest pain.  Gastrointestinal: Negative for abdominal pain and diarrhea.  Genitourinary: Negative for frequency and hematuria.  Musculoskeletal: Negative for back pain.  Skin: Negative for rash.  Neurological: Negative for seizures and headaches.  Psychiatric/Behavioral: Negative for hallucinations.     Physical Exam Updated Vital Signs BP (!) 134/57   Pulse 91   Temp 98 F (36.7 C) (Oral)   Resp (!) 24   SpO2 97%   Physical Exam Vitals signs and nursing note reviewed.  Constitutional:      Appearance: She is well-developed.  HENT:     Head: Normocephalic.     Nose: Nose normal.  Eyes:     General: No scleral icterus.    Conjunctiva/sclera: Conjunctivae normal.  Neck:     Musculoskeletal: Neck supple.     Thyroid: No thyromegaly.  Cardiovascular:     Rate and Rhythm: Normal rate and regular rhythm.     Heart sounds: No murmur. No friction rub. No gallop.   Pulmonary:     Breath sounds: No stridor. Rales present. No wheezing.  Chest:     Chest wall: No tenderness.  Abdominal:     General: There is no distension.     Tenderness: There is no abdominal tenderness. There is no rebound.  Musculoskeletal: Normal range of motion.  Lymphadenopathy:     Cervical: No cervical adenopathy.  Skin:    Findings: No erythema or rash.  Neurological:     Mental Status: She is oriented to person, place, and time.     Motor: No abnormal muscle tone.     Coordination: Coordination normal.  Psychiatric:        Behavior: Behavior normal.      ED Treatments / Results  Labs (all labs ordered are listed, but only abnormal results are displayed) Labs Reviewed  BRAIN NATRIURETIC PEPTIDE - Abnormal; Notable for the following components:      Result Value   B  Natriuretic Peptide 252.0 (*)    All other components within normal limits  CBC WITH DIFFERENTIAL/PLATELET - Abnormal; Notable for the following components:   MCV 101.2 (*)    All other components within normal limits  TROPONIN I  COMPREHENSIVE METABOLIC PANEL    EKG None  Radiology Dg Chest Portable 1 View  Result Date: 04/20/2018 CLINICAL DATA:  Dyspnea EXAM: PORTABLE CHEST 1 VIEW COMPARISON:  03/06/2015 chest radiograph. FINDINGS: Stable cardiomediastinal silhouette with mild cardiomegaly. No pneumothorax. Possible trace bilateral pleural effusions. Mild to moderate pulmonary edema. IMPRESSION: 1. Mild-to-moderate congestive heart failure. 2. Possible trace bilateral pleural effusions. Electronically Signed   By: Ilona Sorrel M.D.   On: 04/20/2018 17:35    Procedures Procedures (including critical care time)  Medications Ordered in ED Medications  furosemide (LASIX) injection 40 mg (40 mg Intravenous Given 04/20/18 1641)     Initial Impression / Assessment and Plan / ED Course  I have reviewed the triage vital signs and the nursing notes.  Pertinent labs & imaging results that were available during my care of the patient were reviewed by me and considered in my medical decision making (see chart for details).    CRITICAL CARE Performed by: Milton Ferguson Total critical care time: 40 minutes Critical care time was exclusive of separately billable procedures and treating other patients. Critical care was necessary to treat or prevent imminent or life-threatening deterioration. Critical care was time spent personally by me on the following activities: development of treatment plan with patient and/or surrogate as well as nursing, discussions with consultants, evaluation of patient's response to treatment, examination of patient, obtaining history from patient or surrogate, ordering and performing treatments and interventions, ordering and review of laboratory studies, ordering and  review of radiographic studies, pulse oximetry and re-evaluation of patient's condition. r  Patient in congestive heart failure.  Patient had O2 sat of room air at 86% when she got up went to the restroom.  She will be admitted for diuresis and congestive heart failure  Final Clinical Impressions(s) / ED Diagnoses   Final diagnoses:  Systolic congestive heart  failure, unspecified HF chronicity [facility])    ED Discharge Orders    None       Milton Ferguson, MD 04/20/18 1925

## 2018-04-20 NOTE — Discharge Instructions (Signed)
Electrical Cardioversion, Care After °This sheet gives you information about how to care for yourself after your procedure. Your health care provider may also give you more specific instructions. If you have problems or questions, contact your health care provider. °What can I expect after the procedure? °After the procedure, it is common to have: °· Some redness on the skin where the shocks were given. °Follow these instructions at home: ° °· Do not drive for 24 hours if you were given a medicine to help you relax (sedative). °· Take over-the-counter and prescription medicines only as told by your health care provider. °· Ask your health care provider how to check your pulse. Check it often. °· Rest for 48 hours after the procedure or as told by your health care provider. °· Avoid or limit your caffeine use as told by your health care provider. °Contact a health care provider if: °· You feel like your heart is beating too quickly or your pulse is not regular. °· You have a serious muscle cramp that does not go away. °Get help right away if: ° °· You have discomfort in your chest. °· You are dizzy or you feel faint. °· You have trouble breathing or you are short of breath. °· Your speech is slurred. °· You have trouble moving an arm or leg on one side of your body. °· Your fingers or toes turn cold or blue. °This information is not intended to replace advice given to you by your health care provider. Make sure you discuss any questions you have with your health care provider. °Document Released: 12/14/2012 Document Revised: 09/27/2015 Document Reviewed: 08/30/2015 °Elsevier Interactive Patient Education © 2019 [employer] ° °

## 2018-04-20 NOTE — ED Notes (Signed)
Patient ambulatory to restroom.  Patient became very short of breath when walking back to room from restroom. Patient's o2 sat dropped to 87%.  After being placed back on oxygen patient's o2 sat increased back to 97%

## 2018-04-20 NOTE — Interval H&P Note (Signed)
History and Physical Interval Note:  04/20/2018 1:46 PM  Diane Cohen  has presented today for surgery, with the diagnosis of atrial fibrillation  The various methods of treatment have been discussed with the patient and family. After consideration of risks, benefits and other options for treatment, the patient has consented to  Procedure(s): CARDIOVERSION (N/A) as a surgical intervention .  The patient's history has been reviewed, patient examined, no change in status, stable for surgery.  I have reviewed the patient's chart and labs.  Questions were answered to the patient's satisfaction.     [name]

## 2018-04-20 NOTE — Transfer of Care (Signed)
Immediate Anesthesia Transfer of Care Note  Patient: Mallie Darting  Procedure(s) Performed: CARDIOVERSION (N/A )  Patient Location: Endoscopy Unit  Anesthesia Type:General  Level of Consciousness: awake and alert   Airway & Oxygen Therapy: Patient Spontanous Breathing and Patient connected to nasal cannula oxygen  Post-op Assessment: Report given to RN and Post -op Vital signs reviewed and stable  Post vital signs: Reviewed and stable  Last Vitals:  Vitals Value Taken Time  BP 120/71 04/20/2018  2:33 PM  Temp 36.8 C 04/20/2018  2:33 PM  Pulse 78 04/20/2018  2:35 PM  Resp 27 04/20/2018  2:35 PM  SpO2 91 % 04/20/2018  2:35 PM    Last Pain:  Vitals:   04/20/18 1433  TempSrc: Oral  PainSc: 0-No pain         Complications: No apparent anesthesia complications

## 2018-04-20 NOTE — ED Notes (Signed)
Pt ambulated t obathroom with assistance. Minimal assistance but pt SATs did drop to 87% on room air. Placed back on 02 and sats came back up to mid 90s

## 2018-04-20 NOTE — H&P (Signed)
History and Physical    Diane Cohen IPJ:[id number] DOB: 1945/06/14 DOA: 04/20/2018  PCP: Sinda Du, MD   Patient coming from: Home   Chief Complaint: SOB   HPI: Diane Cohen is a 73 y.o. female with medical history significant for atrial fibrillation on Xarelto, chronic diastolic CHF, hypertension, and hypothyroidism, now presenting to the emergency department with shortness of breath after a successful electrical cardioversion earlier today.  Patient reports that she had been short of breath prior to the cardioversion and had been experiencing some increase in her chronic lower extremity edema.  She denies any chest pain.  She reports a mild nonproductive cough.  She denies fevers or chills.  ED Course: Upon arrival to the ED, patient is found to be afebrile, saturating adequately on room air while at rest, with vitals otherwise stable. EKG features a normal sinus rhythm and chest x-ray is concerning for mild to moderate CHF. Chemistry panel and CBC are unremarkable. BNP is elevated and troponin is undetectable. Patient was given 40 mg IV Lasix in the ED, has begun to diurese well, but becomes hypoxic in the mid 80s with mild exertion and will be observed for further evaluation and management.  Review of Systems:  All other systems reviewed and apart from HPI, are negative.  Past Medical History:  Diagnosis Date  . Anxiety   . Atrial fibrillation ([place])   . Chronic diastolic heart failure ([place])   . Depression   . Essential hypertension   . Hyperlipidemia   . Hypothyroidism   . Pneumonia   . TIA (transient ischemic attack)     Past Surgical History:  Procedure Laterality Date  . ABDOMINAL HYSTERECTOMY    . ABLATION OF DYSRHYTHMIC FOCUS  07/06/2017  . ATRIAL FIBRILLATION ABLATION N/A 07/06/2017   Procedure: ATRIAL FIBRILLATION ABLATION;  Surgeon: Thompson Grayer, MD;  Location: [place] CV LAB;  Service: Cardiovascular;  Laterality: N/A;  . BREAST SURGERY     biopsy  .  CARDIOVERSION N/A 01/23/2016   Procedure: CARDIOVERSION;  Surgeon: Satira Sark, MD;  [facility];  Service: Cardiovascular;  Laterality: N/A;  . CARDIOVERSION N/A 11/13/2016   Procedure: CARDIOVERSION;  Surgeon: Arnoldo Lenis, MD;  [facility];  Service: Endoscopy;  Laterality: N/A;  . CHOLECYSTECTOMY    . coloscopy    . TEE WITHOUT CARDIOVERSION  07/06/2017  . TEE WITHOUT CARDIOVERSION N/A 07/06/2017   Procedure: TRANSESOPHAGEAL ECHOCARDIOGRAM (TEE);  Surgeon: Josue Hector, MD;  Location: [facility] ENDOSCOPY;  Service: Cardiovascular;  Laterality: N/A;     reports that she quit smoking about 45 years ago. Her smoking use included cigarettes. She smoked 0.50 packs per day. She has never used smokeless tobacco. She reports that she does not drink alcohol or use drugs.  Allergies  Allergen Reactions  . Levaquin [Levofloxacin] Other (See Comments)    Headache   . Sulfa Antibiotics Hives    Family History  Problem Relation Age of Onset  . Hypertension Other   . Hypertension Father   . Hypertension Mother   . Anemia Sister   . Hypertension Sister      Prior to Admission medications   Medication Sig Start Date End Date Taking? Authorizing Provider  acetaminophen (TYLENOL) 650 MG CR tablet Take 1,300 mg every 8 (eight) hours as needed by mouth for pain.    [provider]  albuterol (PROVENTIL HFA;VENTOLIN HFA) 108 (90 BASE) MCG/ACT inhaler Inhale 2 puffs every 6 (six) hours as needed  into the lungs for wheezing or shortness of breath.     [provider]  calcium carbonate (TUMS - DOSED IN MG ELEMENTAL CALCIUM) 500 MG chewable tablet Chew 4 tablets 2 (two) times daily as needed by mouth for indigestion or heartburn.    [provider]  Calcium Carbonate-Vitamin D (CALTRATE 600+D PO) Take 1 tablet by mouth daily.    [provider]  diltiazem (CARDIZEM CD) 240 MG 24 hr capsule Take 1 capsule (240 mg total) by mouth daily.  04/15/17   Satira Sark, MD  diltiazem (CARDIZEM) 30 MG tablet Take 1 tablet (30 mg total) by mouth 4 (four) times daily as needed (for atrial fibrillation). 10/06/17   Allred, Jeneen Rinks, MD  DULoxetine (CYMBALTA) 60 MG capsule Take 60 mg by mouth daily.    [provider]  ferrous sulfate 325 (65 FE) MG tablet Take 325 mg by mouth daily with breakfast.    [provider]  levothyroxine (SYNTHROID, LEVOTHROID) 137 MCG tablet Take 137 mcg by mouth daily before breakfast.  11/04/16   [provider]  lisinopril (PRINIVIL,ZESTRIL) 20 MG tablet Take 1 tablet (20 mg total) by mouth daily. 04/05/13   Rai, Ripudeep K, MD  magnesium oxide (MAG-OX) 400 MG tablet Take 400 mg by mouth daily.    [provider]  metoprolol succinate (TOPROL-XL) 100 MG 24 hr tablet Take 100 mg by mouth daily. 04/06/18   [provider]  Multiple Vitamin (MULTIVITAMIN WITH MINERALS) TABS tablet Take 1 tablet by mouth every evening. Centrum Silver for Adults 50+    [provider]  omeprazole (PRILOSEC) 40 MG capsule Take 40 mg daily by mouth.  11/14/15   [provider]  potassium chloride SA (K-DUR,KLOR-CON) 20 MEQ tablet Take 2 tablets (40 mEq total) by mouth daily. 08/13/15   Lendon Colonel, NP  rizatriptan (MAXALT-MLT) 10 MG disintegrating tablet Take 10 mg by mouth as needed for migraine.     [provider]  torsemide (DEMADEX) 20 MG tablet TAKE 2 TABLETS BY MOUTH EVERY MORNING Patient taking differently: Take 40 mg by mouth daily.  04/05/18   Satira Sark, MD  XARELTO 20 MG TABS tablet TAKE 1 TABLET BY MOUTH EVERY DAY WITH DINNER Patient taking differently: Take 20 mg by mouth daily with supper.  03/22/18   Satira Sark, MD    Physical Exam: Vitals:   04/20/18 1730 04/20/18 1800 04/20/18 1830 04/20/18 1900  BP: 139/89 122/73 140/84 (!) 134/57  Pulse: (!) 103 89 91   Resp: (!) 29 (!) 25 (!) 24 (!) 24  Temp:      TempSrc:      SpO2: 100%  97% 97%     Constitutional: NAD, calm  Eyes: PERTLA, lids and conjunctivae normal ENMT: Mucous membranes are moist. Posterior pharynx clear of any exudate or lesions.   Neck: normal, supple, no masses, no thyromegaly Respiratory: Mild tachypnea, rales bilaterally. No accessory muscle use.  Cardiovascular: S1 & S2 heard, regular rate and rhythm. Pitting edema to bilateral LE's. Abdomen: No distension, no tenderness, soft. Bowel sounds normal.  Musculoskeletal: no clubbing / cyanosis. No joint deformity upper and lower extremities.   Skin: no significant rashes, lesions, ulcers. Warm, dry, well-perfused. Neurologic: CN 2-12 grossly intact. Sensation intact. Strength 5/5 in all 4 limbs.  Psychiatric: Alert and oriented x 3. Pleasant and cooperative.    Labs on Admission: I have personally reviewed following labs and imaging studies  CBC: Recent Labs  Lab  04/14/18 1440 04/20/18 1635  WBC 6.3 7.4  NEUTROABS  --  5.4  HGB 12.4 12.8  HCT 39.8 41.5  MCV 100.0 101.2*  PLT 254 456   Basic Metabolic Panel: Recent Labs  Lab 04/14/18 1440 04/20/18 1635  NA 138 136  K 3.7 3.6  CL 103 104  CO2 23 24  GLUCOSE 99 97  BUN 15 15  CREATININE 0.97 0.78  CALCIUM 9.9 9.7   GFR: Estimated Creatinine Clearance: 64.4 mL/min (by C-G formula based on SCr of 0.78 mg/dL). Liver Function Tests: Recent Labs  Lab 04/20/18 1635  AST 33  ALT 28  ALKPHOS 125  BILITOT 1.0  PROT 7.7  ALBUMIN 4.3   No results for input(s): LIPASE, AMYLASE in the last 168 hours. No results for input(s): AMMONIA in the last 168 hours. Coagulation Profile: No results for input(s): INR, PROTIME in the last 168 hours. Cardiac Enzymes: Recent Labs  Lab 04/20/18 1635  TROPONINI <0.03   BNP (last 3 results) No results for input(s): PROBNP in the last 8760 hours. HbA1C: No results for input(s): HGBA1C in the last 72 hours. CBG: No results for input(s): GLUCAP in the last 168 hours. Lipid Profile: No results  for input(s): CHOL, HDL, LDLCALC, TRIG, CHOLHDL, LDLDIRECT in the last 72 hours. Thyroid Function Tests: No results for input(s): TSH, T4TOTAL, FREET4, T3FREE, THYROIDAB in the last 72 hours. Anemia Panel: No results for input(s): VITAMINB12, FOLATE, FERRITIN, TIBC, IRON, RETICCTPCT in the last 72 hours. Urine analysis:    Component Value Date/Time   COLORURINE YELLOW 12/28/2013 1706   APPEARANCEUR CLEAR 12/28/2013 1706   LABSPEC <1.005 (L) 12/28/2013 1706   PHURINE 5.5 12/28/2013 1706   GLUCOSEU NEGATIVE 12/28/2013 1706   HGBUR NEGATIVE 12/28/2013 1706   BILIRUBINUR NEGATIVE 12/28/2013 1706   KETONESUR NEGATIVE 12/28/2013 1706   PROTEINUR NEGATIVE 12/28/2013 1706   UROBILINOGEN 0.2 12/28/2013 1706   NITRITE NEGATIVE 12/28/2013 1706   LEUKOCYTESUR NEGATIVE 12/28/2013 1706   Sepsis Labs: @LABRCNTIP (procalcitonin:4,lacticidven:4) )No results found for this or any previous visit (from the past 240 hour(s)).   Radiological Exams on Admission: Dg Chest Portable 1 View  Result Date: 04/20/2018 CLINICAL DATA:  Dyspnea EXAM: PORTABLE CHEST 1 VIEW COMPARISON:  03/06/2015 chest radiograph. FINDINGS: Stable cardiomediastinal silhouette with mild cardiomegaly. No pneumothorax. Possible trace bilateral pleural effusions. Mild to moderate pulmonary edema. IMPRESSION: 1. Mild-to-moderate congestive heart failure. 2. Possible trace bilateral pleural effusions. Electronically Signed   By: Ilona Sorrel M.D.   On: 04/20/2018 17:35    EKG: Independently reviewed. Sinus rhythm.   Assessment/Plan   1. Acute on chronic diastolic CHF  - Presents with worsening SOB and bilateral LE edema, has rales on exam, edema on CXR, and she became hypoxic to mid-80's while ambulating a few steps  - She was given 40 mg IV Lasix in ED and has begun to diurese, but remains hypoxic with minimal exertion  - Continue diuresis with Lasix 40 mg IV q12h, follow daily wt and I/O's, continue ACE and beta-blocker as tolerated,  update echo   2. Atrial fibrillation s/p electrical cardioversion  - Patient underwent successful cardioversion 04/20/18  - CHADS-VASc is 55 (age, gender, CHF, HTN)  - Continue Xarelto    3. Hypertension  - BP at goal, continue lisinopril, Toprol, and diltiazem   4. Hypothyroidism  - Continue Synthroid     DVT prophylaxis: Xarelto  Code Status: Full  Family Communication: Discussed with patient  Consults called: None Admission status: Observation  Vianne Bulls, MD [facility] Pager [phone]  If 7PM-7AM, please contact night-coverage [email or web address] Password [facility]  04/20/2018, 7:26 PM

## 2018-04-20 NOTE — ED Triage Notes (Signed)
PT c/o worsening in SOB since her Cardioversion performed at 1430 today at [facility]. PT states she was offered a breathing treatment before she left from having the procedure but the heart doctor told her it may put her back in an arrhythmia and she chose not to take the breathing tx. PT NSR upon arrival to ED.

## 2018-04-21 ENCOUNTER — Observation Stay (HOSPITAL_COMMUNITY): Payer: Medicare PPO

## 2018-04-21 ENCOUNTER — Encounter (HOSPITAL_COMMUNITY): Payer: Self-pay | Admitting: Internal Medicine

## 2018-04-21 DIAGNOSIS — I110 Hypertensive heart disease with heart failure: Secondary | ICD-10-CM | POA: Diagnosis present

## 2018-04-21 DIAGNOSIS — R0902 Hypoxemia: Secondary | ICD-10-CM | POA: Diagnosis present

## 2018-04-21 DIAGNOSIS — Z7901 Long term (current) use of anticoagulants: Secondary | ICD-10-CM | POA: Diagnosis not present

## 2018-04-21 DIAGNOSIS — I5033 Acute on chronic diastolic (congestive) heart failure: Secondary | ICD-10-CM | POA: Diagnosis not present

## 2018-04-21 DIAGNOSIS — I5031 Acute diastolic (congestive) heart failure: Secondary | ICD-10-CM | POA: Diagnosis not present

## 2018-04-21 DIAGNOSIS — I4819 Other persistent atrial fibrillation: Secondary | ICD-10-CM | POA: Diagnosis present

## 2018-04-21 DIAGNOSIS — Z79890 Hormone replacement therapy (postmenopausal): Secondary | ICD-10-CM | POA: Diagnosis not present

## 2018-04-21 DIAGNOSIS — Z881 Allergy status to other antibiotic agents status: Secondary | ICD-10-CM | POA: Diagnosis not present

## 2018-04-21 DIAGNOSIS — Z8673 Personal history of transient ischemic attack (TIA), and cerebral infarction without residual deficits: Secondary | ICD-10-CM | POA: Diagnosis not present

## 2018-04-21 DIAGNOSIS — E785 Hyperlipidemia, unspecified: Secondary | ICD-10-CM | POA: Diagnosis present

## 2018-04-21 DIAGNOSIS — T501X6A Underdosing of loop [high-ceiling] diuretics, initial encounter: Secondary | ICD-10-CM | POA: Diagnosis present

## 2018-04-21 DIAGNOSIS — E039 Hypothyroidism, unspecified: Secondary | ICD-10-CM | POA: Diagnosis present

## 2018-04-21 DIAGNOSIS — Z882 Allergy status to sulfonamides status: Secondary | ICD-10-CM | POA: Diagnosis not present

## 2018-04-21 DIAGNOSIS — Z8249 Family history of ischemic heart disease and other diseases of the circulatory system: Secondary | ICD-10-CM | POA: Diagnosis not present

## 2018-04-21 DIAGNOSIS — I5020 Unspecified systolic (congestive) heart failure: Secondary | ICD-10-CM | POA: Diagnosis present

## 2018-04-21 DIAGNOSIS — Z87891 Personal history of nicotine dependence: Secondary | ICD-10-CM | POA: Diagnosis not present

## 2018-04-21 DIAGNOSIS — I5043 Acute on chronic combined systolic (congestive) and diastolic (congestive) heart failure: Secondary | ICD-10-CM | POA: Diagnosis present

## 2018-04-21 LAB — BASIC METABOLIC PANEL
Anion gap: 8 (ref 5–15)
BUN: 17 mg/dL (ref 8–23)
CO2: 27 mmol/L (ref 22–32)
Calcium: 9.6 mg/dL (ref 8.9–10.3)
Chloride: 104 mmol/L (ref 98–111)
Creatinine, Ser: 0.85 mg/dL (ref 0.44–1.00)
GFR calc Af Amer: >60 mL/min (ref >60)
GFR calc non Af Amer: >60 mL/min (ref >60)
GLUCOSE: 92 mg/dL (ref 70–99)
Potassium: 3.7 mmol/L (ref 3.5–5.1)
Sodium: 139 mmol/L (ref 135–145)

## 2018-04-21 LAB — ECHOCARDIOGRAM COMPLETE
Height: 61 in
Weight: 3028.8 oz

## 2018-04-21 NOTE — Care Management Obs Status (Signed)
[place] NOTIFICATION   Patient Details  Name: Diane Cohen MRN: [id number] Date of Birth: 1945-10-04   Medicare Observation Status Notification Given:  Yes    Sherald Barge, RN 04/21/2018, 9:22 AM

## 2018-04-21 NOTE — Progress Notes (Signed)
*  PRELIMINARY RESULTS* Echocardiogram 2D Echocardiogram has been performed.  Leavy Cella 04/21/2018, 1:13 PM

## 2018-04-21 NOTE — Progress Notes (Signed)
Subjective: She came to the hospital yesterday evening with increasing shortness of breath.  She had cardioversion done yesterday and converted to sinus rhythm.  Chest x-ray in the emergency department was suggestive of mild pulmonary edema.  She feels better after Lasix.  She still had hypoxia with ambulation.  She is on oxygen now.  Objective: Vital signs in last 24 hours: Temp:  [98 F (36.7 C)-98.5 F (36.9 C)] 98.5 F (36.9 C) (02/13 0622) Pulse Rate:  [55-145] 82 (02/13 0622) Resp:  [18-29] 18 (02/13 0622) BP: (116-165)/(56-106) 139/67 (02/13 0622) SpO2:  [90 %-100 %] 97 % (02/13 0622) FiO2 (%):  [0 %] 0 % (02/12 1925) Weight:  [85.9 kg-86.4 kg] 85.9 kg (02/13 0618) Weight change:  Last BM Date: 04/20/18  Intake/Output from previous day: 02/12 0701 - 02/13 0700 In: -  Out: 500 [Urine:500]  PHYSICAL EXAM General appearance: alert, cooperative and no distress Resp: rales bibasilar Cardio: regular rate and rhythm, S1, S2 normal, no murmur, click, rub or gallop GI: soft, non-tender; bowel sounds normal; no masses,  no organomegaly Extremities: She has 2+ edema of the lower extremities some of which is chronic  Lab Results:  Results for orders placed or performed during the hospital encounter of 04/20/18 (from the past 48 hour(s))  Troponin I - ONCE - STAT     Status: None   Collection Time: 04/20/18  4:35 PM  Result Value Ref Range   Troponin I <0.03 <0.03 ng/mL    Comment: Performed at [facility], [address], [place], [place] [address]  Brain natriuretic peptide     Status: Abnormal   Collection Time: 04/20/18  4:35 PM  Result Value Ref Range   B Natriuretic Peptide 252.0 (H) 0.0 - 100.0 pg/mL    Comment: Performed at [facility], [address], [place], [place] [address]  CBC with Differential/Platelet     Status: Abnormal   Collection Time: 04/20/18  4:35 PM  Result Value Ref Range   WBC 7.4 4.0 - 10.5 K/uL   RBC 4.10 3.87 - 5.11 MIL/uL   Hemoglobin 12.8  12.0 - 15.0 g/dL   HCT 41.5 36.0 - 46.0 %   MCV 101.2 (H) 80.0 - 100.0 fL   MCH 31.2 26.0 - 34.0 pg   MCHC 30.8 30.0 - 36.0 g/dL   RDW 15.4 11.5 - 15.5 %   Platelets 291 150 - 400 K/uL   nRBC 0.0 0.0 - 0.2 %   Neutrophils Relative % 74 %   Neutro Abs 5.4 1.7 - 7.7 K/uL   Lymphocytes Relative 20 %   Lymphs Abs 1.5 0.7 - 4.0 K/uL   Monocytes Relative 4 %   Monocytes Absolute 0.3 0.1 - 1.0 K/uL   Eosinophils Relative 1 %   Eosinophils Absolute 0.1 0.0 - 0.5 K/uL   Basophils Relative 1 %   Basophils Absolute 0.0 0.0 - 0.1 K/uL   Immature Granulocytes 0 %   Abs Immature Granulocytes 0.03 0.00 - 0.07 K/uL    Comment: Performed at [facility], [address] [place], [place] [address]  Comprehensive metabolic panel     Status: None   Collection Time: 04/20/18  4:35 PM  Result Value Ref Range   Sodium 136 135 - 145 mmol/L   Potassium 3.6 3.5 - 5.1 mmol/L   Chloride 104 98 - 111 mmol/L   CO2 24 22 - 32 mmol/L   Glucose, Bld 97 70 - 99 mg/dL   BUN 15 8 - 23  mg/dL   Creatinine, Ser 0.78 0.44 - 1.00 mg/dL   Calcium 9.7 8.9 - 10.3 mg/dL   Total Protein 7.7 6.5 - 8.1 g/dL   Albumin 4.3 3.5 - 5.0 g/dL   AST 33 15 - 41 U/L   ALT 28 0 - 44 U/L   Alkaline Phosphatase 125 38 - 126 U/L   Total Bilirubin 1.0 0.3 - 1.2 mg/dL   GFR calc non Af Amer >60 >60 mL/min   GFR calc Af Amer >60 >60 mL/min   Anion gap 8 5 - 15    Comment: Performed at [facility], [address] [place], [place] [address]  Basic metabolic panel     Status: None   Collection Time: 04/21/18  5:11 AM  Result Value Ref Range   Sodium 139 135 - 145 mmol/L   Potassium 3.7 3.5 - 5.1 mmol/L   Chloride 104 98 - 111 mmol/L   CO2 27 22 - 32 mmol/L   Glucose, Bld 92 70 - 99 mg/dL   BUN 17 8 - 23 mg/dL   Creatinine, Ser 0.85 0.44 - 1.00 mg/dL   Calcium 9.6 8.9 - 10.3 mg/dL   GFR calc non Af Amer >60 >60 mL/min   GFR calc Af Amer >60 >60 mL/min   Anion gap 8 5 - 15    Comment: Performed at [facility], [address], [place], [place] [address]    ABGS No results for input(s): PHART, PO2ART, TCO2, HCO3 in the last 72 hours.  Invalid input(s): PCO2 CULTURES No results found for this or any previous visit (from the past 240 hour(s)). Studies/Results: Dg Chest Portable 1 View  Result Date: 04/20/2018 CLINICAL DATA:  Dyspnea EXAM: PORTABLE CHEST 1 VIEW COMPARISON:  03/06/2015 chest radiograph. FINDINGS: Stable cardiomediastinal silhouette with mild cardiomegaly. No pneumothorax. Possible trace bilateral pleural effusions. Mild to moderate pulmonary edema. IMPRESSION: 1. Mild-to-moderate congestive heart failure. 2. Possible trace bilateral pleural effusions. Electronically Signed   By: Ilona Sorrel M.D.   On: 04/20/2018 17:35    Medications:  Prior to Admission:  Medications Prior to Admission  Medication Sig Dispense Refill Last Dose  . acetaminophen (TYLENOL) 650 MG CR tablet Take 1,300 mg every 8 (eight) hours as needed by mouth for pain.   04/19/2018 at Unknown time  . albuterol (PROVENTIL HFA;VENTOLIN HFA) 108 (90 BASE) MCG/ACT inhaler Inhale 2 puffs every 6 (six) hours as needed into the lungs for wheezing or shortness of breath.    04/19/2018 at Unknown time  . calcium carbonate (TUMS - DOSED IN MG ELEMENTAL CALCIUM) 500 MG chewable tablet Chew 4 tablets 2 (two) times daily as needed by mouth for indigestion or heartburn.   Past Week at Unknown time  . Calcium Carbonate-Vitamin D (CALTRATE 600+D PO) Take 1 tablet by mouth daily.   04/19/2018 at Unknown time  . diltiazem (CARDIZEM CD) 240 MG 24 hr capsule Take 1 capsule (240 mg total) by mouth daily. 90 capsule 3 04/20/2018 at Unknown time  . diltiazem (CARDIZEM) 30 MG tablet Take 1 tablet (30 mg total) by mouth 4 (four) times daily as needed (for atrial fibrillation). 60 tablet 3 04/20/2018 at Unknown time  . DULoxetine (CYMBALTA) 60 MG capsule Take 60 mg by mouth daily.   04/19/2018 at Unknown time  . ferrous sulfate 325 (65 FE) MG tablet Take 325 mg by  mouth daily with breakfast.   04/19/2018 at Unknown time  . levothyroxine (SYNTHROID, LEVOTHROID) 137 MCG tablet Take 137 mcg by  mouth daily before breakfast.    Past Week at Unknown time  . lisinopril (PRINIVIL,ZESTRIL) 20 MG tablet Take 1 tablet (20 mg total) by mouth daily.   04/20/2018 at Unknown time  . magnesium oxide (MAG-OX) 400 MG tablet Take 400 mg by mouth daily.   04/19/2018 at Unknown time  . metoprolol succinate (TOPROL-XL) 100 MG 24 hr tablet Take 100 mg by mouth daily.   04/20/2018 at Unknown time  . Multiple Vitamin (MULTIVITAMIN WITH MINERALS) TABS tablet Take 1 tablet by mouth every evening. Centrum Silver for Adults 50+   04/19/2018 at Unknown time  . omeprazole (PRILOSEC) 40 MG capsule Take 40 mg daily by mouth.    04/19/2018 at Unknown time  . potassium chloride SA (K-DUR,KLOR-CON) 20 MEQ tablet Take 2 tablets (40 mEq total) by mouth daily. 180 tablet 3 Past Week at Unknown time  . torsemide (DEMADEX) 20 MG tablet TAKE 2 TABLETS BY MOUTH EVERY MORNING (Patient taking differently: Take 40 mg by mouth daily. ) 180 tablet 1 Past Week at Unknown time  . XARELTO 20 MG TABS tablet TAKE 1 TABLET BY MOUTH EVERY DAY WITH DINNER (Patient taking differently: Take 20 mg by mouth daily with supper. ) 30 tablet 11 04/19/2018 at Unknown time  . rizatriptan (MAXALT-MLT) 10 MG disintegrating tablet Take 10 mg by mouth as needed for migraine.    Unknown at Unknown time   Scheduled: . diltiazem  240 mg Oral Daily  . DULoxetine  60 mg Oral Daily  . furosemide  40 mg Intravenous Q12H  . levothyroxine  137 mcg Oral QAC breakfast  . lisinopril  20 mg Oral Daily  . mouth rinse  15 mL Mouth Rinse BID  . metoprolol succinate  100 mg Oral Daily  . potassium chloride SA  40 mEq Oral Daily  . rivaroxaban  20 mg Oral QHS  . sodium chloride flush  3 mL Intravenous Q12H   Continuous: . sodium chloride     MWU:XLKGMW chloride, acetaminophen, ondansetron (ZOFRAN) IV, sodium chloride flush  Assesment: She  was admitted with acute on chronic diastolic heart failure.  She has received Lasix but will need more.  She has hypertension which is pretty well controlled  He has had persistent atrial fib and she was cardioverted yesterday.   Principal Problem:   Acute on chronic diastolic CHF (congestive heart failure) (HCC) Active Problems:   Essential hypertension, benign   Hypothyroidism   Persistent atrial fibrillation    Plan: Continue diuresis.  Continue other treatments. Check echo   LOS: 0 days   Alonza Bogus 04/21/2018, 8:51 AM

## 2018-04-22 DIAGNOSIS — I5033 Acute on chronic diastolic (congestive) heart failure: Secondary | ICD-10-CM | POA: Diagnosis not present

## 2018-04-22 LAB — BASIC METABOLIC PANEL
Anion gap: 9 (ref 5–15)
BUN: 20 mg/dL (ref 8–23)
CO2: 27 mmol/L (ref 22–32)
Calcium: 9.7 mg/dL (ref 8.9–10.3)
Chloride: 103 mmol/L (ref 98–111)
Creatinine, Ser: 0.89 mg/dL (ref 0.44–1.00)
GFR calc Af Amer: >60 mL/min (ref >60)
GLUCOSE: 91 mg/dL (ref 70–99)
Potassium: 3.7 mmol/L (ref 3.5–5.1)
Sodium: 139 mmol/L (ref 135–145)

## 2018-04-22 NOTE — Care Management Note (Signed)
Case Management Note  Patient Details  Name: LATONJA BOBECK MRN: [id number] Date of Birth: [birth year]  Subjective/Objective:        CHF.             Action/Plan: Discharging home. Ambulated today and does not need oxygen. DC home. No needs.   Expected Discharge Date:  04/22/18               Expected Discharge Plan:  Home/Self Care  In-House Referral:     Discharge planning Services  CM Consult  Post Acute Care Choice:  NA Choice offered to:  NA  DME Arranged:    DME Agency:     HH Arranged:    HH Agency:     Status of Service:  Completed, signed off  If discussed at [employer] of Stay Meetings, dates discussed:    Additional Comments:  Juliah Scadden, Chauncey Reading, RN 04/22/2018, 10:35 AM

## 2018-04-22 NOTE — Progress Notes (Signed)
Patient ambulated in hall without difficulty. Patient O2 sat on RA 90-93% while ambulating.  Patient did become slightly short of breath, however was able to maintain sats above 90% and ambulate without difficultly.

## 2018-04-22 NOTE — Progress Notes (Signed)
Discharge instructions given to patient, verbalized understanding of instructions. security in to return belongings. Patient discharged home with family in stable condition.

## 2018-04-23 NOTE — Discharge Summary (Signed)
Physician Discharge Summary  Patient ID: Diane Cohen MRN: [id number] DOB/AGE: [birth year] 73 y.o. Primary Care Physician:Martese Vanatta, Percell Miller, MD Admit date: 04/20/2018 Discharge date: 04/23/2018    Discharge Diagnoses:   Principal Problem:   Acute on chronic diastolic CHF (congestive heart failure) ([place]) Active Problems:   Essential hypertension, benign   Hypothyroidism   Persistent atrial fibrillation   Allergies as of 04/22/2018      Reactions   Levaquin [levofloxacin] Other (See Comments)   Headache    Sulfa Antibiotics Hives      Medication List    TAKE these medications   acetaminophen 650 MG CR tablet Commonly known as:  TYLENOL Take 1,300 mg every 8 (eight) hours as needed by mouth for pain.   albuterol 108 (90 Base) MCG/ACT inhaler Commonly known as:  PROVENTIL HFA;VENTOLIN HFA Inhale 2 puffs every 6 (six) hours as needed into the lungs for wheezing or shortness of breath.   calcium carbonate 500 MG chewable tablet Commonly known as:  TUMS - dosed in mg elemental calcium Chew 4 tablets 2 (two) times daily as needed by mouth for indigestion or heartburn.   CALTRATE 600+D PO Take 1 tablet by mouth daily.   diltiazem 240 MG 24 hr capsule Commonly known as:  CARDIZEM CD Take 1 capsule (240 mg total) by mouth daily.   diltiazem 30 MG tablet Commonly known as:  CARDIZEM Take 1 tablet (30 mg total) by mouth 4 (four) times daily as needed (for atrial fibrillation).   DULoxetine 60 MG capsule Commonly known as:  CYMBALTA Take 60 mg by mouth daily.   ferrous sulfate 325 (65 FE) MG tablet Take 325 mg by mouth daily with breakfast.   levothyroxine 137 MCG tablet Commonly known as:  SYNTHROID, LEVOTHROID Take 137 mcg by mouth daily before breakfast.   lisinopril 20 MG tablet Commonly known as:  PRINIVIL,ZESTRIL Take 1 tablet (20 mg total) by mouth daily.   magnesium oxide 400 MG tablet Commonly known as:  MAG-OX Take 400 mg by mouth daily.   metoprolol  succinate 100 MG 24 hr tablet Commonly known as:  TOPROL-XL Take 100 mg by mouth daily.   multivitamin with minerals Tabs tablet Take 1 tablet by mouth every evening. Centrum Silver for Adults 50+   omeprazole 40 MG capsule Commonly known as:  PRILOSEC Take 40 mg daily by mouth.   potassium chloride SA 20 MEQ tablet Commonly known as:  K-DUR,KLOR-CON Take 2 tablets (40 mEq total) by mouth daily.   rizatriptan 10 MG disintegrating tablet Commonly known as:  MAXALT-MLT Take 10 mg by mouth as needed for migraine.   torsemide 20 MG tablet Commonly known as:  DEMADEX TAKE 2 TABLETS BY MOUTH EVERY MORNING What changed:  when to take this   XARELTO 20 MG Tabs tablet Generic drug:  rivaroxaban TAKE 1 TABLET BY MOUTH EVERY DAY WITH DINNER What changed:  See the new instructions.       Discharged Condition: Improved    Consults: None  Significant Diagnostic Studies: Dg Chest Portable 1 View  Result Date: 04/20/2018 CLINICAL DATA:  Dyspnea EXAM: PORTABLE CHEST 1 VIEW COMPARISON:  03/06/2015 chest radiograph. FINDINGS: Stable cardiomediastinal silhouette with mild cardiomegaly. No pneumothorax. Possible trace bilateral pleural effusions. Mild to moderate pulmonary edema. IMPRESSION: 1. Mild-to-moderate congestive heart failure. 2. Possible trace bilateral pleural effusions. Electronically Signed   By: Ilona Sorrel M.D.   On: 04/20/2018 17:35    Lab Results: Basic Metabolic Panel: Recent Labs    04/21/18  0511 04/22/18 0445  NA 139 139  K 3.7 3.7  CL 104 103  CO2 27 27  GLUCOSE 92 91  BUN 17 20  CREATININE 0.85 0.89  CALCIUM 9.6 9.7   Liver Function Tests: Recent Labs    04/20/18 1635  AST 33  ALT 28  ALKPHOS 125  BILITOT 1.0  PROT 7.7  ALBUMIN 4.3     CBC: Recent Labs    04/20/18 1635  WBC 7.4  NEUTROABS 5.4  HGB 12.8  HCT 41.5  MCV 101.2*  PLT 291    No results found for this or any previous visit (from the past 240 hour(s)).   Hospital  Course: This is a 73 year old who underwent cardioversion on the day of admission and who developed increasing shortness of breath after the cardioversion.  She came to the emergency department was found to have heart failure.  Echocardiogram confirmed diastolic dysfunction.  She was still in sinus rhythm.  She was started on intravenous Lasix and improved.  By the time of discharge she was back at baseline.  She has chronic edema of the lower extremities and that was still present.  Discharge Exam: Blood pressure 125/61, pulse 71, temperature 98.2 F (36.8 C), temperature source Oral, resp. rate 18, height 5\' 1"  (1.549 m), weight 86.5 kg, SpO2 97 %. She is awake and alert.  Chest is clear.  Heart is regular.  Abdomen soft.  1-2+ edema of the extremities  Disposition: Home      Signed: Alonza Bogus   04/23/2018, 10:16 AM

## 2018-04-27 ENCOUNTER — Other Ambulatory Visit: Payer: Self-pay | Admitting: *Deleted

## 2018-04-27 NOTE — Patient Outreach (Signed)
[facility]) Care Management  04/27/2018  [name] 13-Apr-1945 [id number]   EMMI-general discharge, AP  RED ON EMMI ALERT Day # 1 Date: Sunday 04/24/2018 [place] Reason: Scheduled follow-up? No  Insurance:  [facility] admissions x 1 ED visits x 1  in the last 6 months    Outreach attempt # 1 No answer. [facility] RN CM left HIPAA compliant voicemail message along with CM's contact info.   Plan: [facility] RN CM sent an unsuccessful outreach letter and scheduled this patient for another call attempt within 4 business days  Iris Hairston L. Lavina Hamman, RN, BSN, [place] Coordinator Office number [phone] Mobile number [phone]  Main [facility] number [phone] Fax number [phone]

## 2018-04-28 ENCOUNTER — Ambulatory Visit: Payer: Self-pay | Admitting: *Deleted

## 2018-04-29 ENCOUNTER — Ambulatory Visit: Payer: Self-pay | Admitting: *Deleted

## 2018-04-29 ENCOUNTER — Other Ambulatory Visit: Payer: Self-pay | Admitting: Cardiology

## 2018-05-02 ENCOUNTER — Other Ambulatory Visit: Payer: Self-pay | Admitting: *Deleted

## 2018-05-02 NOTE — Patient Outreach (Signed)
[facility]) Care Management  05/02/2018  DANE BLOCH January 03, 1946 [id number]     EMMI-general discharge, AP  RED ON EMMI ALERT Day # 1 Date: Sunday 04/24/2018 [place] Reason: Scheduled follow-up? No  Insurance:  [facility] admissions x 1 ED visits x 1  in the last 6 months    Outreach attempt # 2 No answer. [facility] RN CM left HIPAA compliant voicemail message along with CM's contact info.   Plan: [facility] RN CM scheduled this patient for another call attempt within 4 business days  Khaidyn Staebell L. Lavina Hamman, RN, BSN, [place] Coordinator Office number [phone] Mobile number [phone]  Main [facility] number [phone] Fax number [phone]

## 2018-05-02 NOTE — Patient Outreach (Addendum)
[facility]) Care Management  05/02/2018  Diane Cohen May 02, 1945 [id number]    EMMI-general discharge,AP  RED ON EMMI ALERT Day #1 Date:Sunday 04/24/2018 1633 [place] Reason:Scheduled follow-up? No  Insurance:[employer] medicare and [facility] admissions[facility] 1ED visits x 1in the last 6 months      Patient returned a call to [place] Patient is able to verify HIPAA Reviewed and addressed EMMI red alert/referral to [facility] with patient  EMMI Diane Cohen confirms she has a follow up appointment with Dr Luan Pulling on 05/05/18 and had been unable to make the appointment on the day of the EMMI call (A Sunday)  CM assessed for s/s of CHF and Diane Cohen reports her CHF issues have been ongoing, " I know what to do but I have sneaked and ate potato chips" Cm discussed the importance of maintaining her diet, use of diuretics and weighing daily She reports non adherence to home care interventions. Cm offered and discussed the services of [facility] health coach but she wants to see MD before making a decision Cm discussed the importance of following the CHF discharge instructions she reports she has received especially related to weighing daily  CM encouraged her to speak with her MD and to call CM prn   Social: Diane Cohen lives at home with her husband and has good support from her sister in law She is retired She denies issues with transportation to medical appointments She is independent with her care needs   Conditions: HTN, persistent atrial fibrillation, Chronic diastolic heart failure, hypothyroidism, anemia, edema of limbs  DME scales eyeglasses   Medications: denies concerns with taking medications as prescribed, affording medications, side effects of medications and questions about medications   Advance Directives: She has a POA and living will   Consent: [facility] RN CM reviewed [facility] services with patient. Patient gave verbal consent for services. She denies need of  services from [facility]/Telephonic RN CM, pharmacy, health coach, NP or SW at this time    Plans [facility] RN CM will close case at this time as patient has been assessed and no needs identified/needs resolved.   Pt encouraged to return a call to [place] CM prn  [facility] RN CM sent a successful outreach letter as discussed with [facility] brochure enclosed for review  Kimberly L. Lavina Hamman, RN, BSN, [place] Coordinator Office number [phone] Mobile number [phone]  Main [facility] number [phone] Fax number [phone]

## 2018-05-02 NOTE — Patient Outreach (Signed)
[facility]) Care Management  05/02/2018  DENNICE TINDOL 1945-10-13 [id number]   Opened in error  Joelene Millin L. Lavina Hamman, RN, BSN, [place] Coordinator Office number [phone] Mobile number [phone]  Main [facility] number [phone] Fax number [phone]

## 2018-05-03 ENCOUNTER — Ambulatory Visit (HOSPITAL_COMMUNITY)
Admission: RE | Admit: 2018-05-03 | Discharge: 2018-05-03 | Disposition: A | Discharge status: Home / Self Care | Payer: Medicare PPO | Source: Ambulatory Visit | Attending: Nurse Practitioner | Admitting: Nurse Practitioner

## 2018-05-03 ENCOUNTER — Encounter (HOSPITAL_COMMUNITY): Payer: Self-pay | Admitting: Nurse Practitioner

## 2018-05-03 ENCOUNTER — Ambulatory Visit: Payer: Self-pay | Admitting: *Deleted

## 2018-05-03 VITALS — BP 140/70 | HR 76 | Ht 61.0 in | Wt 183.0 lb

## 2018-05-03 DIAGNOSIS — Z882 Allergy status to sulfonamides status: Secondary | ICD-10-CM | POA: Insufficient documentation

## 2018-05-03 DIAGNOSIS — F419 Anxiety disorder, unspecified: Secondary | ICD-10-CM | POA: Insufficient documentation

## 2018-05-03 DIAGNOSIS — F329 Major depressive disorder, single episode, unspecified: Secondary | ICD-10-CM | POA: Diagnosis not present

## 2018-05-03 DIAGNOSIS — I4819 Other persistent atrial fibrillation: Secondary | ICD-10-CM | POA: Insufficient documentation

## 2018-05-03 DIAGNOSIS — E039 Hypothyroidism, unspecified: Secondary | ICD-10-CM | POA: Insufficient documentation

## 2018-05-03 DIAGNOSIS — E785 Hyperlipidemia, unspecified: Secondary | ICD-10-CM | POA: Diagnosis not present

## 2018-05-03 DIAGNOSIS — I5032 Chronic diastolic (congestive) heart failure: Secondary | ICD-10-CM | POA: Diagnosis not present

## 2018-05-03 DIAGNOSIS — Z79899 Other long term (current) drug therapy: Secondary | ICD-10-CM | POA: Insufficient documentation

## 2018-05-03 DIAGNOSIS — Z79890 Hormone replacement therapy (postmenopausal): Secondary | ICD-10-CM | POA: Insufficient documentation

## 2018-05-03 DIAGNOSIS — Z87891 Personal history of nicotine dependence: Secondary | ICD-10-CM | POA: Insufficient documentation

## 2018-05-03 DIAGNOSIS — Z888 Allergy status to other drugs, medicaments and biological substances status: Secondary | ICD-10-CM | POA: Insufficient documentation

## 2018-05-03 DIAGNOSIS — Z8249 Family history of ischemic heart disease and other diseases of the circulatory system: Secondary | ICD-10-CM | POA: Insufficient documentation

## 2018-05-03 DIAGNOSIS — I110 Hypertensive heart disease with heart failure: Secondary | ICD-10-CM | POA: Diagnosis not present

## 2018-05-03 DIAGNOSIS — Z7901 Long term (current) use of anticoagulants: Secondary | ICD-10-CM | POA: Insufficient documentation

## 2018-05-03 DIAGNOSIS — Z8673 Personal history of transient ischemic attack (TIA), and cerebral infarction without residual deficits: Secondary | ICD-10-CM | POA: Insufficient documentation

## 2018-05-03 NOTE — Progress Notes (Signed)
Primary Care Physician: Sinda Du, MD Referring Physician: Dr. Dionne Bucy is a 73 y.o. female with a h/o chronic diastolic heart failure, HTN, afib, previous TIA, that  Is in the afib clinic for f/u . She had an ablation 07/2017.    She is in afib today and has been in afib since the beginning of January. She feels the trigger was choking on a calcium tablet and she felt like she could not  get her air. She hit her chest hard trying to disloge the table and felt the afib come on shortly after that . The pill finally moved. She knew this appointment was coming up so she chose to just wait to further address the afib. Continues on anticoagulation without any missed doses.  She did increase her metoprolol dose.  F/u in afib clinic 2/25. She had successful cardioversion but on the way home, she developed shortness of breath and presented to [facility] ER and was admitted for CHF, diastolic dysfunction.Marland Kitchen She was diuresed and was d/c 2 days later. She has maintained  SR and feels improved.  Today, she denies symptoms of palpitations, chest pain, shortness of breath, orthopnea, PND, chronic lower extremity edema, dizziness, presyncope, syncope, or neurologic sequela. The patient is tolerating medications without difficulties and is otherwise without complaint today.   Past Medical History:  Diagnosis Date  . Anxiety   . Atrial fibrillation ([place])   . Chronic diastolic heart failure ([place])   . Depression   . Essential hypertension   . Hyperlipidemia   . Hypothyroidism   . Pneumonia   . TIA (transient ischemic attack)    Past Surgical History:  Procedure Laterality Date  . ABDOMINAL HYSTERECTOMY    . ABLATION OF DYSRHYTHMIC FOCUS  07/06/2017  . ATRIAL FIBRILLATION ABLATION N/A 07/06/2017   Procedure: ATRIAL FIBRILLATION ABLATION;  Surgeon: Thompson Grayer, MD;  Location: [facility] CV LAB;  Service: Cardiovascular;  Laterality: N/A;  . BREAST SURGERY     biopsy  . CARDIOVERSION N/A  01/23/2016   Procedure: CARDIOVERSION;  Surgeon: Satira Sark, MD;  [facility];  Service: Cardiovascular;  Laterality: N/A;  . CARDIOVERSION N/A 11/13/2016   Procedure: CARDIOVERSION;  Surgeon: Arnoldo Lenis, MD;  [facility];  Service: Endoscopy;  Laterality: N/A;  . CARDIOVERSION N/A 04/20/2018   Procedure: CARDIOVERSION;  Surgeon: [name], MD;  Location: [facility] ENDOSCOPY;  Service: Cardiovascular;  Laterality: N/A;  . CHOLECYSTECTOMY    . coloscopy    . TEE WITHOUT CARDIOVERSION  07/06/2017  . TEE WITHOUT CARDIOVERSION N/A 07/06/2017   Procedure: TRANSESOPHAGEAL ECHOCARDIOGRAM (TEE);  Surgeon: Josue Hector, MD;  Location: [facility] ENDOSCOPY;  Service: Cardiovascular;  Laterality: N/A;    Current Outpatient Medications  Medication Sig Dispense Refill  . acetaminophen (TYLENOL) 650 MG CR tablet Take 1,300 mg every 8 (eight) hours as needed by mouth for pain.    Marland Kitchen albuterol (PROVENTIL HFA;VENTOLIN HFA) 108 (90 BASE) MCG/ACT inhaler Inhale 2 puffs every 6 (six) hours as needed into the lungs for wheezing or shortness of breath.     . calcium carbonate (TUMS - DOSED IN MG ELEMENTAL CALCIUM) 500 MG chewable tablet Chew 4 tablets 2 (two) times daily as needed by mouth for indigestion or heartburn.    . Calcium Carbonate-Vitamin D (CALTRATE 600+D PO) Take 1 tablet by mouth daily.    Marland Kitchen diltiazem (CARDIZEM CD) 240 MG 24 hr capsule TAKE 1 CAPSULE(240 MG) BY MOUTH DAILY 90  capsule 3  . diltiazem (CARDIZEM) 30 MG tablet Take 1 tablet (30 mg total) by mouth 4 (four) times daily as needed (for atrial fibrillation). 60 tablet 3  . DULoxetine (CYMBALTA) 60 MG capsule Take 60 mg by mouth daily.    . ferrous sulfate 325 (65 FE) MG tablet Take 325 mg by mouth daily with breakfast.    . levothyroxine (SYNTHROID, LEVOTHROID) 137 MCG tablet Take 137 mcg by mouth daily before breakfast.     . lisinopril (PRINIVIL,ZESTRIL) 20 MG tablet Take 1 tablet (20 mg total) by mouth daily.     . magnesium oxide (MAG-OX) 400 MG tablet Take 400 mg by mouth daily.    . metoprolol succinate (TOPROL-XL) 100 MG 24 hr tablet Take 100 mg by mouth daily.    . Multiple Vitamin (MULTIVITAMIN WITH MINERALS) TABS tablet Take 1 tablet by mouth every evening. Centrum Silver for Adults 50+    . omeprazole (PRILOSEC) 40 MG capsule Take 40 mg daily by mouth.     . potassium chloride SA (K-DUR,KLOR-CON) 20 MEQ tablet Take 2 tablets (40 mEq total) by mouth daily. 180 tablet 3  . rizatriptan (MAXALT-MLT) 10 MG disintegrating tablet Take 10 mg by mouth as needed for migraine.     . torsemide (DEMADEX) 20 MG tablet TAKE 2 TABLETS BY MOUTH EVERY MORNING (Patient taking differently: Take 40 mg by mouth daily. ) 180 tablet 1  . XARELTO 20 MG TABS tablet TAKE 1 TABLET BY MOUTH EVERY DAY WITH DINNER (Patient taking differently: Take 20 mg by mouth daily with supper. ) 30 tablet 11   No current facility-administered medications for this encounter.     Allergies  Allergen Reactions  . Levaquin [Levofloxacin] Other (See Comments)    Headache   . Sulfa Antibiotics Hives    Social History   Socioeconomic History  . Marital status: Married    Spouse name: Not on file  . Number of children: Not on file  . Years of education: Not on file  . Highest education level: Not on file  Occupational History  . Not on file  Social Needs  . Financial resource strain: Not on file  . Food insecurity:    Worry: Not on file    Inability: Not on file  . Transportation needs:    Medical: Not on file    Non-medical: Not on file  Tobacco Use  . Smoking status: Former Smoker    Packs/day: 0.50    Types: Cigarettes    Last attempt to quit: 01/22/1973    Years since quitting: 45.3  . Smokeless tobacco: Never Used  Substance and Sexual Activity  . Alcohol use: No    Alcohol/week: 0.0 standard drinks  . Drug use: No  . Sexual activity: Yes    Birth control/protection: Surgical  Lifestyle  . Physical activity:      Days per week: Not on file    Minutes per session: Not on file  . Stress: Not on file  Relationships  . Social connections:    Talks on phone: Not on file    Gets together: Not on file    Attends religious service: Not on file    Active member of club or organization: Not on file    Attends meetings of clubs or organizations: Not on file    Relationship status: Not on file  . Intimate partner violence:    Fear of current or ex partner: Not on file    Emotionally abused: Not  on file    Physically abused: Not on file    Forced sexual activity: Not on file  Other Topics Concern  . Not on file  Social History Narrative   Lives with spouse in [place] has ALS and is dependant on the patient for transition from bed to chair.    Family History  Problem Relation Age of Onset  . Hypertension Other   . Hypertension Father   . Hypertension Mother   . Anemia Sister   . Hypertension Sister     ROS- All systems are reviewed and negative except as per the HPI above  Physical Exam: Vitals:   05/03/18 1358  BP: 140/70  Pulse: 76  Weight: 83 kg  Height: 5\' 1"  (1.549 m)   Wt Readings from Last 3 Encounters:  05/03/18 83 kg  04/22/18 86.5 kg  04/14/18 88.9 kg    Labs: Lab Results  Component Value Date   NA 139 04/22/2018   K 3.7 04/22/2018   CL 103 04/22/2018   CO2 27 04/22/2018   GLUCOSE 91 04/22/2018   BUN 20 04/22/2018   CREATININE 0.89 04/22/2018   CALCIUM 9.7 04/22/2018   MG 2.1 12/29/2013   Lab Results  Component Value Date   INR 1.31 01/22/2016   No results found for: CHOL, HDL, LDLCALC, TRIG   GEN- The patient is well appearing, alert and oriented x 3 today.   Head- normocephalic, atraumatic Eyes-  Sclera clear, conjunctiva pink Ears- hearing intact Oropharynx- clear Neck- supple, no JVP Lymph- no cervical lymphadenopathy Lungs- Clear to ausculation bilaterally, normal work of breathing Heart- regular rate and rhythm, no murmurs, rubs or  gallops, PMI not laterally displaced GI- soft, NT, ND, + BS Extremities- no clubbing, cyanosis, + for edema MS- no significant deformity or atrophy Skin- no rash or lesion Psych- euthymic mood, full affect Neuro- strength and sensation are intact  EKG- NSR at 76 bpm, PR int 162 ms, qrs int 74 ms, qtc 434 ms Epic records reviewed Echo- 04/21/18-IMPRESSIONS    1. The left ventricle has normal systolic function with an ejection fraction of 60-65%. The cavity size was normal. There is mildly increased left ventricular wall thickness. Left ventricular diastolic Doppler parameters are consistent with  pseudonormalization Elevated mean left atrial pressure.  2. The right ventricle has normal systolic function. The cavity was mildly enlarged. There is no increase in right ventricular wall thickness.  3. Left atrial size was mildly dilated.  4. The mitral valve is normal in structure. There is mild thickening. No evidence of mitral valve stenosis.  5. The tricuspid valve is normal in structure.  6. The aortic valve is tricuspid There is mild thickening of the aortic valve.  7. The aortic root is normal in size and structure.  8. Right atrial pressure is estimated at 3 mmHg.  FINDINGS  Left Ventricle: The left ventricle has normal systolic function, with an ejection fraction of 60-65%. The cavity size was normal. There is mildly increased left ventricular wall thickness. Left ventricular diastolic Doppler parameters are consistent  with pseudonormalization Elevated mean left atrial pressure Right Ventricle: The right ventricle has normal systolic function. The cavity was mildly enlarged. There is no increase in right ventricular wall thickness. Left Atrium: left atrial size was mildly dilated Right Atrium: right atrial size was normal in size Right atrial pressure is estimated at 3 mmHg. Interatrial Septum: No atrial level shunt detected by color flow Doppler. Pericardium: There is no evidence  of pericardial effusion. Mitral Valve: The mitral valve is normal in structure. There is mild thickening. Mitral valve regurgitation is mild by color flow Doppler. No evidence of mitral valve stenosis. Tricuspid Valve: The tricuspid valve is normal in structure. Tricuspid valve regurgitation is mild by color flow Doppler. Aortic Valve: The aortic valve is tricuspid There is mild thickening of the aortic valve. Aortic valve regurgitation was not visualized by color flow Doppler. Pulmonic Valve: The pulmonic valve was grossly normal. Pulmonic valve regurgitation is not visualized by color flow Doppler. No evidence of pulmonic stenosis. Aorta: The aortic root is normal in size and structure. Venous: The inferior vena cava is normal in size with greater than 50% respiratory variability.   Assessment and Plan: 1. Persistent afib S/p ablation 07/2017 In persistent afib since early January Trigger may have been choking on a potassium tablet S/p successful cardioversion but with shortness of breath afterward and diuresed at [facility] Now feels she is back to baseline Continue metorpolol succinate 100 mg daily  Continue Cardizem 240 mg daily  Continue xarelto 20 mg daily for chadsvasc score of  at least 6    2. HTN  Stable  3.Diastolic dysfunction/ HF Recently diuresed/stable  F/u here one week after cardioversion  Diane Cohen, [facility] [address] [place], [place] [address] [phone]

## 2018-05-04 ENCOUNTER — Ambulatory Visit (HOSPITAL_COMMUNITY): Payer: Self-pay | Admitting: Nurse Practitioner

## 2018-05-05 DIAGNOSIS — I5030 Unspecified diastolic (congestive) heart failure: Secondary | ICD-10-CM | POA: Diagnosis not present

## 2018-05-05 DIAGNOSIS — I10 Essential (primary) hypertension: Secondary | ICD-10-CM | POA: Diagnosis not present

## 2018-05-05 DIAGNOSIS — I129 Hypertensive chronic kidney disease with stage 1 through stage 4 chronic kidney disease, or unspecified chronic kidney disease: Secondary | ICD-10-CM | POA: Diagnosis not present

## 2018-05-05 DIAGNOSIS — I482 Chronic atrial fibrillation, unspecified: Secondary | ICD-10-CM | POA: Diagnosis not present

## 2018-05-12 ENCOUNTER — Telehealth: Payer: Self-pay

## 2018-05-12 ENCOUNTER — Encounter: Payer: Self-pay | Admitting: Gastroenterology

## 2018-05-12 ENCOUNTER — Ambulatory Visit (INDEPENDENT_AMBULATORY_CARE_PROVIDER_SITE_OTHER): Payer: Medicare PPO | Admitting: Gastroenterology

## 2018-05-12 ENCOUNTER — Other Ambulatory Visit: Payer: Self-pay

## 2018-05-12 VITALS — BP 115/68 | HR 77 | Temp 97.4°F | Ht 61.0 in | Wt 179.2 lb

## 2018-05-12 DIAGNOSIS — Z8719 Personal history of other diseases of the digestive system: Secondary | ICD-10-CM | POA: Insufficient documentation

## 2018-05-12 DIAGNOSIS — Z1211 Encounter for screening for malignant neoplasm of colon: Secondary | ICD-10-CM | POA: Diagnosis not present

## 2018-05-12 MED ORDER — NA SULFATE-K SULFATE-MG SULF 17.5-3.13-1.6 GM/177ML PO SOLN: 1.0000 | ORAL | 0 refills | Status: DC

## 2018-05-12 NOTE — Progress Notes (Signed)
Primary Care Physician:  Sinda Du, MD Primary Gastroenterologist:  Dr. Gala Romney   Chief Complaint  Patient presents with  . Consult    TCS. had done 10 yrs ago    HPI:   Diane Cohen is a 73 y.o. female presenting today at the request of Dr. Luan Pulling for screening colonoscopy. She was brought in for an office visit due to chronic anticoagulation. Last colonoscopy over 10 years ago in [place]. No polyps. Performed endoscopy at same time and showed a hiatal hernia. Wants an EGD as well this time to check hernia.   Sometimes abdominal cramping with diarrhea. Lately about once a week. No food triggers. Will have 2-3 loose stools once a week. Baseline constipation. Doesn't take anything for constipation. No fiber supplementation. BM sometimes every other day and sometimes up to 3 days. Believes she has taken Amitiza before but felt nauseated. In the past would get a sensation of a knot in upper abdomen, took extra Tums and it went away. No issues in awhile. PPI daily. No dysphagia  Husband has ALS. Patient doesn't want to take Miralax as her husband takes this and can't make it to bathroom sometimes.   Past Medical History:  Diagnosis Date  . Anxiety   . Atrial fibrillation ([place])   . Chronic diastolic heart failure ([place])   . Depression   . Essential hypertension   . Hyperlipidemia   . Hypothyroidism   . Pneumonia   . TIA (transient ischemic attack)     Past Surgical History:  Procedure Laterality Date  . ABDOMINAL HYSTERECTOMY    . ABLATION OF DYSRHYTHMIC FOCUS  07/06/2017  . ATRIAL FIBRILLATION ABLATION N/A 07/06/2017   Procedure: ATRIAL FIBRILLATION ABLATION;  Surgeon: Thompson Grayer, MD;  Location: [facility] CV LAB;  Service: Cardiovascular;  Laterality: N/A;  . BREAST SURGERY     biopsy  . CARDIOVERSION N/A 01/23/2016   Procedure: CARDIOVERSION;  Surgeon: Satira Sark, MD;  [facility];  Service: Cardiovascular;  Laterality: N/A;  . CARDIOVERSION N/A  11/13/2016   Procedure: CARDIOVERSION;  Surgeon: Arnoldo Lenis, MD;  [facility];  Service: Endoscopy;  Laterality: N/A;  . CARDIOVERSION N/A 04/20/2018   Procedure: CARDIOVERSION;  Surgeon: [name], MD;  Location: [facility] ENDOSCOPY;  Service: Cardiovascular;  Laterality: N/A;  . CHOLECYSTECTOMY    . coloscopy    . TEE WITHOUT CARDIOVERSION  07/06/2017  . TEE WITHOUT CARDIOVERSION N/A 07/06/2017   Procedure: TRANSESOPHAGEAL ECHOCARDIOGRAM (TEE);  Surgeon: Josue Hector, MD;  Location: [facility] ENDOSCOPY;  Service: Cardiovascular;  Laterality: N/A;    Current Outpatient Medications  Medication Sig Dispense Refill  . acetaminophen (TYLENOL) 650 MG CR tablet Take 1,300 mg every 8 (eight) hours as needed by mouth for pain.    Marland Kitchen albuterol (PROVENTIL HFA;VENTOLIN HFA) 108 (90 BASE) MCG/ACT inhaler Inhale 2 puffs every 6 (six) hours as needed into the lungs for wheezing or shortness of breath.     . calcium carbonate (TUMS - DOSED IN MG ELEMENTAL CALCIUM) 500 MG chewable tablet Chew 4 tablets 2 (two) times daily as needed by mouth for indigestion or heartburn.    . Calcium Carbonate-Vitamin D (CALTRATE 600+D PO) Take 1 tablet by mouth daily.    Marland Kitchen diltiazem (CARDIZEM CD) 240 MG 24 hr capsule TAKE 1 CAPSULE(240 MG) BY MOUTH DAILY 90 capsule 3  . diltiazem (CARDIZEM) 30 MG tablet Take 1 tablet (30 mg total) by mouth 4 (four) times daily as needed (  for atrial fibrillation). 60 tablet 3  . DULoxetine (CYMBALTA) 60 MG capsule Take 60 mg by mouth daily.    . ferrous sulfate 325 (65 FE) MG tablet Take 325 mg by mouth daily with breakfast.    . levothyroxine (SYNTHROID, LEVOTHROID) 137 MCG tablet Take 137 mcg by mouth daily before breakfast.     . lisinopril (PRINIVIL,ZESTRIL) 20 MG tablet Take 1 tablet (20 mg total) by mouth daily.    . magnesium oxide (MAG-OX) 400 MG tablet Take 400 mg by mouth daily.    . metolazone (ZAROXOLYN) 5 MG tablet Take 5 mg by mouth. Twice a week    . metoprolol  succinate (TOPROL-XL) 100 MG 24 hr tablet Take 100 mg by mouth daily.    . Multiple Vitamin (MULTIVITAMIN WITH MINERALS) TABS tablet Take 1 tablet by mouth every evening. Centrum Silver for Adults 50+    . omeprazole (PRILOSEC) 40 MG capsule Take 40 mg daily by mouth.     . potassium chloride SA (K-DUR,KLOR-CON) 20 MEQ tablet Take 2 tablets (40 mEq total) by mouth daily. 180 tablet 3  . rizatriptan (MAXALT-MLT) 10 MG disintegrating tablet Take 10 mg by mouth as needed for migraine.     . torsemide (DEMADEX) 20 MG tablet TAKE 2 TABLETS BY MOUTH EVERY MORNING (Patient taking differently: Take 40 mg by mouth daily. ) 180 tablet 1  . XARELTO 20 MG TABS tablet TAKE 1 TABLET BY MOUTH EVERY DAY WITH DINNER (Patient taking differently: Take 20 mg by mouth daily with supper. ) 30 tablet 11   No current facility-administered medications for this visit.     Allergies as of 05/12/2018 - Review Complete 05/12/2018  Allergen Reaction Noted  . Levaquin [levofloxacin] Other (See Comments) 03/31/2013  . Sulfa antibiotics Hives 03/31/2013    Family History  Problem Relation Age of Onset  . Hypertension Other   . Hypertension Father   . Hypertension Mother   . Anemia Sister   . Hypertension Sister     Social History   Socioeconomic History  . Marital status: Married    Spouse name: Not on file  . Number of children: Not on file  . Years of education: Not on file  . Highest education level: Not on file  Occupational History  . Not on file  Social Needs  . Financial resource strain: Not on file  . Food insecurity:    Worry: Not on file    Inability: Not on file  . Transportation needs:    Medical: Not on file    Non-medical: Not on file  Tobacco Use  . Smoking status: Former Smoker    Packs/day: 0.50    Types: Cigarettes    Last attempt to quit: 01/22/1973    Years since quitting: 45.3  . Smokeless tobacco: Never Used  Substance and Sexual Activity  . Alcohol use: Yes    Alcohol/week:  0.0 standard drinks    Comment: 3 times a year  . Drug use: No  . Sexual activity: Yes    Birth control/protection: Surgical  Lifestyle  . Physical activity:    Days per week: Not on file    Minutes per session: Not on file  . Stress: Not on file  Relationships  . Social connections:    Talks on phone: Not on file    Gets together: Not on file    Attends religious service: Not on file    Active member of club or organization: Not on file  Attends meetings of clubs or organizations: Not on file    Relationship status: Not on file  . Intimate partner violence:    Fear of current or ex partner: Not on file    Emotionally abused: Not on file    Physically abused: Not on file    Forced sexual activity: Not on file  Other Topics Concern  . Not on file  Social History Narrative   Lives with spouse in [place] has ALS and is dependant on the patient for transition from bed to chair.    Review of Systems: Gen: Denies any fever, chills, fatigue, weight loss, lack of appetite.  CV: Denies chest pain, heart palpitations, peripheral edema, syncope.  Resp: Denies shortness of breath at rest or with exertion. Denies wheezing or cough.  GI: see HPI GU : Denies urinary burning, urinary frequency, urinary hesitancy MS: Denies joint pain, muscle weakness, cramps, or limitation of movement.  Derm: Denies rash, itching, dry skin Psych: Denies depression, anxiety, memory loss, and confusion Heme: Denies bruising, bleeding, and enlarged lymph nodes.  Physical Exam: BP 115/68   Pulse 77   Temp (!) 97.4 F (36.3 C) (Oral)   Ht 5\' 1"  (1.549 m)   Wt 179 lb 3.2 oz (81.3 kg)   BMI 33.86 kg/m  General:   Alert and oriented. Pleasant and cooperative. Well-nourished and well-developed.  Head:  Normocephalic and atraumatic. Eyes:  Without icterus, sclera clear and conjunctiva pink.  Ears:  Normal auditory acuity. Nose:  No deformity, discharge,  or lesions. Mouth:  No deformity or  lesions, oral mucosa pink.  Lungs:  Clear to auscultation bilaterally. No wheezes, rales, or rhonchi. No distress.  Heart:  S1, S2 present, irregularly irregular Abdomen:  +BS, soft, non-tender and non-distended. No HSM noted. No guarding or rebound. No masses appreciated.  Rectal:  Deferred  Msk:  With moderate kyphosis  Extremities:  With chronic pedal and ankle edema Neurologic:  Alert and  oriented x4 Psych:  Alert and cooperative. Normal mood and affect.

## 2018-05-12 NOTE — Patient Instructions (Signed)
Please stop Xarelto 48 hours prior to colonoscopy. We will discuss with Dr. Rayann Heman as well.  Start taking Benefiber daily: please see handout. Let me know if this helps with bowel regimen.  We have arranged an xray of your esophagus to evaluate your hernia further.  We will see you in 3-4 months!  It was a pleasure to see you today. I strive to create trusting relationships with patients to provide genuine, compassionate, and quality care. I value your feedback. If you receive a survey regarding your visit,  I greatly appreciate you taking time to fill this out.   Annitta Needs, PhD, ANP-BC [facility]

## 2018-05-12 NOTE — Progress Notes (Signed)
cc'ed to pcp °

## 2018-05-12 NOTE — Assessment & Plan Note (Addendum)
73 year old female [place], now presenting for average risk screening colonoscopy. Baseline constipation with occasional self-limited diarrhea. No alarm signs/symptoms.   Proceed with TCS with Dr. Gala Romney in near future: the risks, benefits, and alternatives have been discussed with the patient in detail. The patient states understanding and desires to proceed. On Xarelto for afib: discussed with cardiology and recommended hold for no more than 24 hours. Patient is aware.  Add Benefiber Return in 3 months

## 2018-05-12 NOTE — Telephone Encounter (Signed)
Pt requested low volume prep. She is aware her insurance only covers [employer]. Stated she has heard other people talk about that one and doesn't want it. Informed her low volume prep could cost up to $100. Stated she has $100 and wants low volume prep. Suprep sent to pharmacy.

## 2018-05-12 NOTE — Assessment & Plan Note (Addendum)
Notes this was found at time of EGD over 10 years ago. No dysphagia. Historically would have a sensation of a  "knot" in epigastric region but none recently. Desires EGD but without any abdominal pain now, no dysphagia, no unexplained weight loss or lack of appetite. Will pursue barium swallow first, as Diane Cohen would like hiatal hernia to be assessed.   Addendum 3/10: I reviewed BPE with patient. Diane Cohen is now reporting epigastric discomfort at least twice a week, requiring OTC agents. No dysphagia. No N/V. We will add diagnostic EGD at time of colonoscopy. I reviewed with her the risks and benefits in detail. Diane Cohen desires to proceed and is glad that an EGD will be performed at time of colonoscopy. Diane Cohen will hold Xarelto for 24 hours, as cardiology feels Diane Cohen is not able to hold for 48 hours with history of TIA.

## 2018-05-12 NOTE — Telephone Encounter (Signed)
Dr. Thompson Grayer,   This mutual patient was seen in our office today by Roseanne Kaufman, NP.  We have her scheduled for a colonoscopy with Dr. Gala Romney on 07/12/2018.  Please advise if it is OK to HOLD Xarelto x 48 hours prior to procedure.  Thanks so much!   Everardo All, LPN

## 2018-05-14 NOTE — Telephone Encounter (Signed)
I will forward to our pharmacy team per anticoagulation protocol.

## 2018-05-16 ENCOUNTER — Ambulatory Visit (HOSPITAL_COMMUNITY)
Admission: RE | Admit: 2018-05-16 | Discharge: 2018-05-16 | Disposition: A | Discharge status: Home / Self Care | Payer: Medicare PPO | Source: Ambulatory Visit | Attending: Gastroenterology | Admitting: Gastroenterology

## 2018-05-16 DIAGNOSIS — Z8719 Personal history of other diseases of the digestive system: Secondary | ICD-10-CM | POA: Diagnosis not present

## 2018-05-16 NOTE — Telephone Encounter (Signed)
PT is aware cardiologist has approved for her to hold Xarelto for 48 hours prior to procedure and start back as soon as possible after procedure.  She is aware this information is on her instructions and she will put it out where she can be reminded when to do it.

## 2018-05-16 NOTE — Telephone Encounter (Signed)
Patient with diagnosis of Afib on Xarelto for anticoagulation.    Procedure: Colonoscopy Date of procedure: 07/12/18  CHADS2-VASc score of  5 (CHF, HTN, AGE, DM2, stroke/tia x 2, CAD, AGE, female)  CrCl 76ml/min  Per office protocol, patient can hold Xarelto for 24 hours prior to procedure.  Would recommend resume Xarelto as soon as safe post procedure due to history of TIA.

## 2018-05-17 ENCOUNTER — Other Ambulatory Visit: Payer: Self-pay | Admitting: *Deleted

## 2018-05-17 ENCOUNTER — Telehealth: Payer: Self-pay | Admitting: Gastroenterology

## 2018-05-17 ENCOUNTER — Telehealth: Payer: Self-pay | Admitting: *Deleted

## 2018-05-17 DIAGNOSIS — Z1211 Encounter for screening for malignant neoplasm of colon: Secondary | ICD-10-CM

## 2018-05-17 DIAGNOSIS — R1013 Epigastric pain: Secondary | ICD-10-CM

## 2018-05-17 NOTE — Telephone Encounter (Signed)
Patient aware.

## 2018-05-17 NOTE — Telephone Encounter (Signed)
With history of TIA we recommend holding Xarelto for ONLY 24 hours. We did NOT approve a 48 hour hold.

## 2018-05-17 NOTE — Telephone Encounter (Signed)
PA is pending via [place] website for EGD. Tracking # [id number]. Clinicals faxed

## 2018-05-17 NOTE — Telephone Encounter (Signed)
Discussed with patient her upper GI symptoms. Please arrange EGD at time of colonoscopy with Dr. Gala Romney due to dyspepsia.

## 2018-05-17 NOTE — Telephone Encounter (Signed)
New instructions mailed to pt. LMOVM

## 2018-05-17 NOTE — Telephone Encounter (Signed)
Thanks, Georgina Peer, for the reminder! I had seen the 24 hour recommendation, and we will make sure the patient is aware of this.   [facility] and [facility] clinical pool: patient should only hold Xarelto 24 hours prior. Please make sure she is aware of this.

## 2018-05-17 NOTE — Progress Notes (Signed)
BPE without obvious or visible hiatal hernia. If she is not having any abdominal pain, dysphagia, then would not pursue EGD.

## 2018-05-17 NOTE — Telephone Encounter (Signed)
Patient aware EGD added

## 2018-05-18 NOTE — Telephone Encounter (Signed)
[employer]# [id number] dates 07/12/2018-08/11/2018. Letter scanned in

## 2018-06-02 ENCOUNTER — Telehealth: Payer: Self-pay | Admitting: Internal Medicine

## 2018-06-02 NOTE — Telephone Encounter (Signed)
LMOVM

## 2018-06-02 NOTE — Telephone Encounter (Signed)
Spoke with patient and she is wanting to r/s procedure as her transportation is unable to take her this day. She is r/s'd to 6/3 at 1:00pm. New instructions mailed. Called endo and spoke with Maudie Mercury regarding new appt time.

## 2018-06-02 NOTE — Telephone Encounter (Signed)
[place] ABOUT RESCHEDULING PROCEDURE

## 2018-07-11 ENCOUNTER — Telehealth: Payer: Self-pay | Admitting: Internal Medicine

## 2018-07-11 NOTE — Telephone Encounter (Signed)
Tried to call pt, no answer, LMOVM for return call.  

## 2018-07-11 NOTE — Telephone Encounter (Signed)
Pt wanted to cancel her OV for 6/17 and to cancel her procedure with [facility] on 6/3. She said that she needed to take her husband to the doctor

## 2018-07-12 ENCOUNTER — Encounter (HOSPITAL_COMMUNITY): Payer: Self-pay

## 2018-07-12 ENCOUNTER — Ambulatory Visit (HOSPITAL_COMMUNITY): Admit: 2018-07-12 | Payer: Medicare PPO | Admitting: Internal Medicine

## 2018-07-12 SURGERY — COLONOSCOPY
Anesthesia: Moderate Sedation

## 2018-07-12 NOTE — Telephone Encounter (Signed)
Called pt, she's not sure at this time if she wants to cancel TCS/EGD w/[facility] for 08/10/18. Advised pt to call back if she decides to cancel procedure.

## 2018-07-13 NOTE — Telephone Encounter (Signed)
Pt called and said she would like to r/s her procedure that's scheduled for 08/10/18. That time and date wont work for pt.

## 2018-07-13 NOTE — Telephone Encounter (Signed)
Called patient and states her transportation is unable to take her to procedure on 6/3. Patient r/s'd to 7/31 at [place] will mail new instructions (confirmed address). Called endo and LMOVM making aware of appt change.

## 2018-07-15 ENCOUNTER — Telehealth: Payer: Self-pay

## 2018-07-15 NOTE — Telephone Encounter (Signed)
Spoke with pt regarding appt on 07/18/18. Pt was advise to check vitals prior to appt. Pt questions and concerns were address.

## 2018-07-18 ENCOUNTER — Telehealth (INDEPENDENT_AMBULATORY_CARE_PROVIDER_SITE_OTHER): Payer: Medicare PPO | Admitting: Internal Medicine

## 2018-07-18 VITALS — BP 109/59 | HR 76 | Wt 170.0 lb

## 2018-07-18 DIAGNOSIS — I4819 Other persistent atrial fibrillation: Secondary | ICD-10-CM | POA: Diagnosis not present

## 2018-07-18 DIAGNOSIS — I110 Hypertensive heart disease with heart failure: Secondary | ICD-10-CM | POA: Diagnosis not present

## 2018-07-18 DIAGNOSIS — I5032 Chronic diastolic (congestive) heart failure: Secondary | ICD-10-CM

## 2018-07-18 DIAGNOSIS — I10 Essential (primary) hypertension: Secondary | ICD-10-CM

## 2018-07-18 NOTE — Progress Notes (Signed)
Electrophysiology TeleHealth Note   Due to national recommendations of social distancing due to COVID 19, an audio/video telehealth visit is felt to be most appropriate for this patient at this time.  See MyChart message from today for the patient's consent to telehealth for [facility].   Date:  07/18/2018   ID:  Diane Cohen, DOB Feb 22, 1946, MRN [id number]  Location: patient's home  Provider location: [place]  Evaluation Performed: Follow-up visit  PCP:  Sinda Du, MD  Cardiologist:  Rozann Lesches, MD  Electrophysiologist:  Dr Rayann Heman  Chief Complaint:  afib  History of Present Illness:    Diane Cohen is a 73 y.o. female who presents via audio/video conferencing for a telehealth visit today.  Since last being seen in our clinic, the patient reports doing very well.  Today, she denies symptoms of palpitations, chest pain, shortness of breath,  lower extremity edema, dizziness, presyncope, or syncope.  The patient is otherwise without complaint today.  The patient denies symptoms of fevers, chills, cough, or new SOB worrisome for COVID 19.  Past Medical History:  Diagnosis Date  . Anxiety   . Atrial fibrillation ([place])   . Chronic diastolic heart failure ([place])   . Depression   . Essential hypertension   . Hyperlipidemia   . Hypothyroidism   . Pneumonia   . TIA (transient ischemic attack)    12-13 years ago    Past Surgical History:  Procedure Laterality Date  . ABDOMINAL HYSTERECTOMY    . ABLATION OF DYSRHYTHMIC FOCUS  07/06/2017  . ATRIAL FIBRILLATION ABLATION N/A 07/06/2017   Procedure: ATRIAL FIBRILLATION ABLATION;  Surgeon: Thompson Grayer, MD;  Location: [facility];  Service: Cardiovascular;  Laterality: N/A;  . BREAST SURGERY     biopsy  . CARDIOVERSION N/A 01/23/2016   Procedure: CARDIOVERSION;  Surgeon: Satira Sark, MD;  Location: [facility];  Service: Cardiovascular;  Laterality: N/A;  . CARDIOVERSION N/A 11/13/2016   Procedure: CARDIOVERSION;  Surgeon: Arnoldo Lenis, MD;  Location: [facility];  Service: Endoscopy;  Laterality: N/A;  . CARDIOVERSION N/A 04/20/2018   Procedure: CARDIOVERSION;  Surgeon: [name], MD;  Location: [facility] ENDOSCOPY;  Service: Cardiovascular;  Laterality: N/A;  . CHOLECYSTECTOMY    . coloscopy    . TEE WITHOUT CARDIOVERSION  07/06/2017  . TEE WITHOUT CARDIOVERSION N/A 07/06/2017   Procedure: TRANSESOPHAGEAL ECHOCARDIOGRAM (TEE);  Surgeon: Josue Hector, MD;  Location: [facility] ENDOSCOPY;  Service: Cardiovascular;  Laterality: N/A;    Current Outpatient Medications  Medication Sig Dispense Refill  . acetaminophen (TYLENOL) 650 MG CR tablet Take 1,300 mg every 8 (eight) hours as needed by mouth for pain.    . Calcium Carbonate-Vitamin D (CALTRATE 600+D PO) Take 1 tablet by mouth daily.    Marland Kitchen diltiazem (CARDIZEM CD) 240 MG 24 hr capsule TAKE 1 CAPSULE(240 MG) BY MOUTH DAILY 90 capsule 3  . diltiazem (CARDIZEM) 30 MG tablet Take 1 tablet (30 mg total) by mouth 4 (four) times daily as needed (for atrial fibrillation). 60 tablet 3  . DULoxetine (CYMBALTA) 60 MG capsule Take 60 mg by mouth daily.    . ferrous sulfate 325 (65 FE) MG tablet Take 325 mg by mouth daily with breakfast.    . levothyroxine (SYNTHROID, LEVOTHROID) 137 MCG tablet Take 137 mcg by mouth daily before breakfast.     . lisinopril (PRINIVIL,ZESTRIL) 20 MG tablet Take 1 tablet (20 mg total) by mouth daily.    Marland Kitchen  magnesium oxide (MAG-OX) 400 MG tablet Take 400 mg by mouth daily.    . metolazone (ZAROXOLYN) 5 MG tablet Take 5 mg by mouth. Twice a week    . metoprolol succinate (TOPROL-XL) 100 MG 24 hr tablet Take 100 mg by mouth daily.    . Multiple Vitamin (MULTIVITAMIN WITH MINERALS) TABS tablet Take 1 tablet by mouth every evening. Centrum Silver for Adults 50+    . Na Sulfate-K Sulfate-Mg Sulf (SUPREP BOWEL PREP KIT) 17.5-3.13-1.6 GM/177ML SOLN Take 1 kit by mouth as directed. 1 Bottle 0  . omeprazole  (PRILOSEC) 40 MG capsule Take 40 mg daily by mouth.     . potassium chloride SA (K-DUR,KLOR-CON) 20 MEQ tablet Take 2 tablets (40 mEq total) by mouth daily. 180 tablet 3  . rizatriptan (MAXALT-MLT) 10 MG disintegrating tablet Take 10 mg by mouth as needed for migraine.     . torsemide (DEMADEX) 20 MG tablet TAKE 2 TABLETS BY MOUTH EVERY MORNING (Patient taking differently: Take 40 mg by mouth daily. ) 180 tablet 1  . XARELTO 20 MG TABS tablet TAKE 1 TABLET BY MOUTH EVERY DAY WITH DINNER (Patient taking differently: Take 20 mg by mouth daily with supper. ) 30 tablet 11   No current facility-administered medications for this visit.     Allergies:   Levaquin [levofloxacin] and Sulfa antibiotics   Social History:  The patient  reports that she quit smoking about 45 years ago. Her smoking use included cigarettes. She smoked 0.50 packs per day. She has never used smokeless tobacco. She reports current alcohol use. She reports that she does not use drugs.   Family History:  The patient's  family history includes Anemia in her sister; Hypertension in her father, mother, sister, and another family member.   ROS:  Please see the history of present illness.   All other systems are personally reviewed and negative.    Exam:    Vital Signs:  BP (!) 109/59   Pulse 76   Wt 170 lb (77.1 kg)   BMI 32.12 kg/m   Well appearing, alert and conversant, regular work of breathing,  good skin color Eyes- anicteric, neuro- grossly intact, skin- no apparent rash or lesions or cyanosis, mouth- oral mucosa is pink   Labs/Other Tests and Data Reviewed:    Recent Labs: 04/20/2018: ALT 28; B Natriuretic Peptide 252.0; Hemoglobin 12.8; Platelets 291 04/22/2018: BUN 20; Creatinine, Ser 0.89; Potassium 3.7; Sodium 139   Wt Readings from Last 3 Encounters:  07/18/18 170 lb (77.1 kg)  05/12/18 179 lb 3.2 oz (81.3 kg)  05/03/18 183 lb (83 kg)     Other studies personally reviewed: Additional studies/ records that  were reviewed today include: my prior office notes,  [facility] clinic notes  Review of the above records today demonstrates: as above   ASSESSMENT & PLAN:    1.  Persistent afib Reasonably well controlled post ablation She did require cardioversion in February.  No subsequent afib On OAC therapy for chads2vasc score of 5  2. Overweight Lifestyle modification is encouraged She has lost 9 lbs since last visit!  3. HTN Stable No change required today  4. chronic diastolic dysfunction Improved with sinus rhythm  5. COVID 19 screen The patient denies symptoms of COVID 19 at this time.  The importance of social distancing was discussed today.  Follow-up:   AF clinic in 6 months   Current medicines are reviewed at length with the patient today.   The patient does  not have concerns regarding her medicines.  The following changes were made today:  none  Labs/ tests ordered today include:  No orders of the defined types were placed in this encounter.   Patient Risk:  after full review of this patients clinical status, I feel that they are at moderate risk at this time.  Today, I have spent 15 minutes with the patient with telehealth technology discussing afig .    Signed, Thompson Grayer, MD  07/18/2018 2:25 PM     [facility] [address] [place] [address] [phone] (office) [phone] (fax)

## 2018-08-24 ENCOUNTER — Ambulatory Visit: Payer: Self-pay | Admitting: Gastroenterology

## 2018-09-07 ENCOUNTER — Telehealth: Payer: Self-pay | Admitting: Internal Medicine

## 2018-09-07 ENCOUNTER — Telehealth: Payer: Self-pay | Admitting: *Deleted

## 2018-09-07 NOTE — Telephone Encounter (Signed)
Spoke to pt, COVID test scheduled for 10/04/18 at 1:00pm (prior to procedure 10/07/18). Pt is aware to quarantine after test until procedure.

## 2018-09-07 NOTE — Telephone Encounter (Signed)
LMOVM

## 2018-09-07 NOTE — Telephone Encounter (Signed)
See other phone note

## 2018-09-07 NOTE — Telephone Encounter (Signed)
Patient returned call, please call back  

## 2018-09-26 DIAGNOSIS — I5030 Unspecified diastolic (congestive) heart failure: Secondary | ICD-10-CM | POA: Diagnosis not present

## 2018-09-26 DIAGNOSIS — I10 Essential (primary) hypertension: Secondary | ICD-10-CM | POA: Diagnosis not present

## 2018-09-26 DIAGNOSIS — J449 Chronic obstructive pulmonary disease, unspecified: Secondary | ICD-10-CM | POA: Diagnosis not present

## 2018-09-26 DIAGNOSIS — I87001 Postthrombotic syndrome without complications of right lower extremity: Secondary | ICD-10-CM | POA: Diagnosis not present

## 2018-10-02 ENCOUNTER — Other Ambulatory Visit: Payer: Self-pay | Admitting: Cardiology

## 2018-10-03 NOTE — Telephone Encounter (Signed)
Refilled torsemide

## 2018-10-04 ENCOUNTER — Other Ambulatory Visit: Payer: Self-pay

## 2018-10-04 ENCOUNTER — Other Ambulatory Visit (HOSPITAL_COMMUNITY)
Admission: RE | Admit: 2018-10-04 | Discharge: 2018-10-04 | Disposition: A | Discharge status: Home / Self Care | Payer: Medicare PPO | Source: Ambulatory Visit | Attending: Internal Medicine | Admitting: Internal Medicine

## 2018-10-04 DIAGNOSIS — Z20828 Contact with and (suspected) exposure to other viral communicable diseases: Secondary | ICD-10-CM | POA: Insufficient documentation

## 2018-10-04 LAB — SARS CORONAVIRUS 2 (TAT 6-24 HRS): SARS Coronavirus 2: NEGATIVE

## 2018-10-07 ENCOUNTER — Encounter (HOSPITAL_COMMUNITY)
Admission: RE | Disposition: A | Discharge status: Home / Self Care | Payer: Self-pay | Source: Home / Self Care | Attending: Internal Medicine

## 2018-10-07 ENCOUNTER — Other Ambulatory Visit: Payer: Self-pay

## 2018-10-07 ENCOUNTER — Encounter (HOSPITAL_COMMUNITY): Payer: Self-pay | Admitting: *Deleted

## 2018-10-07 ENCOUNTER — Ambulatory Visit (HOSPITAL_COMMUNITY)
Admission: RE | Admit: 2018-10-07 | Discharge: 2018-10-07 | Disposition: A | Discharge status: Home / Self Care | Payer: Medicare PPO | Attending: Internal Medicine | Admitting: Internal Medicine

## 2018-10-07 DIAGNOSIS — D120 Benign neoplasm of cecum: Secondary | ICD-10-CM | POA: Diagnosis not present

## 2018-10-07 DIAGNOSIS — I5032 Chronic diastolic (congestive) heart failure: Secondary | ICD-10-CM | POA: Diagnosis not present

## 2018-10-07 DIAGNOSIS — Z1211 Encounter for screening for malignant neoplasm of colon: Secondary | ICD-10-CM | POA: Diagnosis not present

## 2018-10-07 DIAGNOSIS — R1013 Epigastric pain: Secondary | ICD-10-CM | POA: Insufficient documentation

## 2018-10-07 DIAGNOSIS — I4891 Unspecified atrial fibrillation: Secondary | ICD-10-CM | POA: Diagnosis not present

## 2018-10-07 DIAGNOSIS — F419 Anxiety disorder, unspecified: Secondary | ICD-10-CM | POA: Insufficient documentation

## 2018-10-07 DIAGNOSIS — K621 Rectal polyp: Secondary | ICD-10-CM | POA: Diagnosis not present

## 2018-10-07 DIAGNOSIS — K5730 Diverticulosis of large intestine without perforation or abscess without bleeding: Secondary | ICD-10-CM | POA: Insufficient documentation

## 2018-10-07 DIAGNOSIS — F329 Major depressive disorder, single episode, unspecified: Secondary | ICD-10-CM | POA: Diagnosis not present

## 2018-10-07 DIAGNOSIS — R1310 Dysphagia, unspecified: Secondary | ICD-10-CM

## 2018-10-07 DIAGNOSIS — D122 Benign neoplasm of ascending colon: Secondary | ICD-10-CM | POA: Insufficient documentation

## 2018-10-07 DIAGNOSIS — K635 Polyp of colon: Secondary | ICD-10-CM

## 2018-10-07 DIAGNOSIS — Z87891 Personal history of nicotine dependence: Secondary | ICD-10-CM | POA: Insufficient documentation

## 2018-10-07 DIAGNOSIS — E039 Hypothyroidism, unspecified: Secondary | ICD-10-CM | POA: Insufficient documentation

## 2018-10-07 DIAGNOSIS — Z79899 Other long term (current) drug therapy: Secondary | ICD-10-CM | POA: Insufficient documentation

## 2018-10-07 DIAGNOSIS — Z79890 Hormone replacement therapy (postmenopausal): Secondary | ICD-10-CM | POA: Insufficient documentation

## 2018-10-07 DIAGNOSIS — Z8673 Personal history of transient ischemic attack (TIA), and cerebral infarction without residual deficits: Secondary | ICD-10-CM | POA: Diagnosis not present

## 2018-10-07 DIAGNOSIS — I110 Hypertensive heart disease with heart failure: Secondary | ICD-10-CM | POA: Insufficient documentation

## 2018-10-07 DIAGNOSIS — Z7901 Long term (current) use of anticoagulants: Secondary | ICD-10-CM | POA: Diagnosis not present

## 2018-10-07 HISTORY — PX: POLYPECTOMY: SHX5525

## 2018-10-07 HISTORY — PX: COLONOSCOPY: SHX5424

## 2018-10-07 HISTORY — PX: ESOPHAGOGASTRODUODENOSCOPY: SHX5428

## 2018-10-07 HISTORY — PX: ESOPHAGEAL DILATION: SHX303

## 2018-10-07 SURGERY — COLONOSCOPY
Anesthesia: Moderate Sedation

## 2018-10-07 MED ORDER — MIDAZOLAM HCL 5 MG/5ML IJ SOLN
INTRAMUSCULAR | Status: DC | PRN
  Administered 2018-10-07 (×3): 1 mg via INTRAVENOUS
  Administered 2018-10-07: 2 mg via INTRAVENOUS
  Administered 2018-10-07: 1 mg via INTRAVENOUS

## 2018-10-07 MED ORDER — ONDANSETRON HCL 4 MG/2ML IJ SOLN
INTRAMUSCULAR | Status: AC
  Filled 2018-10-07: qty 2

## 2018-10-07 MED ORDER — MEPERIDINE HCL 100 MG/ML IJ SOLN
INTRAMUSCULAR | Status: DC | PRN
  Administered 2018-10-07: 25 mg via INTRAVENOUS
  Administered 2018-10-07: 15 mg via INTRAVENOUS
  Administered 2018-10-07: 10 mg via INTRAVENOUS

## 2018-10-07 MED ORDER — LIDOCAINE VISCOUS HCL 2 % MT SOLN
OROMUCOSAL | Status: DC | PRN
  Administered 2018-10-07: 6 mL via OROMUCOSAL

## 2018-10-07 MED ORDER — MIDAZOLAM HCL 5 MG/5ML IJ SOLN
INTRAMUSCULAR | Status: AC
  Filled 2018-10-07: qty 10

## 2018-10-07 MED ORDER — MEPERIDINE HCL 50 MG/ML IJ SOLN
INTRAMUSCULAR | Status: AC
  Filled 2018-10-07: qty 1

## 2018-10-07 MED ORDER — SODIUM CHLORIDE 0.9 % IV SOLN
INTRAVENOUS | Status: DC
  Administered 2018-10-07: 12:00:00 1000 mL via INTRAVENOUS

## 2018-10-07 MED ORDER — STERILE WATER FOR IRRIGATION IR SOLN
Status: DC | PRN
  Administered 2018-10-07: 4 mL

## 2018-10-07 MED ORDER — LIDOCAINE VISCOUS HCL 2 % MT SOLN
OROMUCOSAL | Status: AC
  Filled 2018-10-07: qty 15

## 2018-10-07 NOTE — Op Note (Signed)
[facility] Patient Name: Diane Cohen Procedure Date: 10/07/2018 12:51 PM MRN: [id number] Date of Birth: 07/23/1945 Attending MD: Norvel Richards , MD CSN: [id number] Age: 73 Admit Type: Outpatient Procedure:                Colonoscopy Indications:              Screening for colorectal malignant neoplasm Providers:                Norvel Richards, MD, Hinton Rao, RN,                            Raphael Gibney, Technician Referring MD:              Medicines:                Midazolam 7 mg IV Complications:            No immediate complications. Estimated Blood Loss:     Estimated blood loss was minimal. Procedure:                Pre-Anesthesia Assessment:                           - Prior to the procedure, a History and Physical                            was performed, and patient medications and                            allergies were reviewed. The patient's tolerance of                            previous anesthesia was also reviewed. The risks                            and benefits of the procedure and the sedation                            options and risks were discussed with the patient.                            All questions were answered, and informed consent                            was obtained. Prior Anticoagulants: The patient                            last took Xarelto (rivaroxaban) 2 days prior to the                            procedure. ASA Grade Assessment: III - A patient                            with severe systemic disease. After reviewing the  risks and benefits, the patient was deemed in                            satisfactory condition to undergo the procedure.                           After obtaining informed consent, the colonoscope                            was passed under direct vision. Throughout the                            procedure, the patient's blood pressure, pulse, and     oxygen saturations were monitored continuously. The                            [id number]-[id number] ([id number]) scope was introduced through                            the anus and advanced to the the cecum, identified                            by appendiceal orifice and ileocecal valve. Scope In: 12:53:42 PM Scope Out: 1:30:20 PM Scope Withdrawal Time: 0 hours 30 minutes 41 seconds  Total Procedure Duration: 0 hours 36 minutes 38 seconds  Findings:      The perianal and digital rectal examinations were normal.      A few medium-mouthed diverticula were found in the sigmoid colon and       descending colon.      Three semi-pedunculated polyps were found in the rectum, ascending colon       and ileocecal valve. The polyps were 6 to 15 mm in size. These polyps       were removed with a hot snare. Resection and retrieval were complete.       Estimated blood loss was minimal.      A 6 mm polyp was found in the cecum. The polyp was sessile. The polyp       was removed with a cold snare. Resection and retrieval were complete.       Estimated blood loss was minimal. The largest 15 mm polyp in the cecum       was removed completely in piecemeal fashion. It was a difficult       approach. All 3 hot snare sites wanted to ooze following polyp removal.       Each received 1 clip; the largest polyp in the cecum received 2 clips to       ensure good hemostasis.      The exam was otherwise without abnormality on direct and retroflexion       views. Impression:               - Diverticulosis in the sigmoid colon and in the                            descending colon.                           -  Three 6 to 15 mm polyps in the rectum, in the                            ascending colon and at the ileocecal valve, removed                            with a hot snare. Resected and retrieved.                           - One 6 mm polyp in the cecum, removed with a cold                            snare. Resected and  retrieved.                           - The examination was otherwise normal on direct                            and retroflexion views. Moderate Sedation:      Moderate (conscious) sedation was administered by the endoscopy nurse       and supervised by the endoscopist. The following parameters were       monitored: oxygen saturation, heart rate, blood pressure, respiratory       rate, EKG, adequacy of pulmonary ventilation, and response to care.       Total physician intraservice time was 57 minutes. Recommendation:           - Patient has a contact number available for                            emergencies. The signs and symptoms of potential                            delayed complications were discussed with the                            patient. Return to normal activities tomorrow.                            Written discharge instructions were provided to the                            patient.                           - Resume previous diet.                           - Continue present medications.                           - Repeat colonoscopy date to be determined after                            pending pathology results are reviewed for  surveillance based on pathology results.                           - Return to GI office in 6 weeks. No MRI until                            clips gone. See EGD report. Resume Xarelto today. Procedure Code(s):        --- Professional ---                           [phone], Colonoscopy, flexible; with removal of                            tumor(s), polyp(s), or other lesion(s) by snare                            technique                           99153, Moderate sedation; each additional 15                            minutes intraservice time                           99153, Moderate sedation; each additional 15                            minutes intraservice time                           99153, Moderate sedation;  each additional 15                            minutes intraservice time                           G0500, Moderate sedation services provided by the                            same physician or other qualified health care                            professional performing a gastrointestinal                            endoscopic service that sedation supports,                            requiring the presence of an independent trained                            observer to assist in the monitoring of the                            patient's level of consciousness  and physiological                            status; initial 15 minutes of intra-service time;                            patient age 66 years or older (additional time may                            be reported with [phone], as appropriate) Diagnosis Code(s):        --- Professional ---                           Z12.11, Encounter for screening for malignant                            neoplasm of colon                           K62.1, Rectal polyp                           K63.5, Polyp of colon                           K57.30, Diverticulosis of large intestine without                            perforation or abscess without bleeding CPT copyright 2019 [employer]. All rights reserved. The codes documented in this report are preliminary and upon coder review may  be revised to meet current compliance requirements. Diane Cohen. Diane Spanier, MD Norvel Richards, MD 10/07/2018 1:57:59 PM This report has been signed electronically. Number of Addenda: 0

## 2018-10-07 NOTE — Discharge Instructions (Signed)
Colonoscopy Discharge Instructions  Read the instructions outlined below and refer to this sheet in the next few weeks. These discharge instructions provide you with general information on caring for yourself after you leave the hospital. Your doctor may also give you specific instructions. While your treatment has been planned according to the most current medical practices available, unavoidable complications occasionally occur. If you have any problems or questions after discharge, call Dr. Gala Romney at [phone]. ACTIVITY  You may resume your regular activity, but move at a slower pace for the next 24 hours.   Take frequent rest periods for the next 24 hours.   Walking will help get rid of the air and reduce the bloated feeling in your belly (abdomen).   No driving for 24 hours (because of the medicine (anesthesia) used during the test).    Do not sign any important legal documents or operate any machinery for 24 hours (because of the anesthesia used during the test).  NUTRITION  Drink plenty of fluids.   You may resume your normal diet as instructed by your doctor.   Begin with a light meal and progress to your normal diet. Heavy or fried foods are harder to digest and may make you feel sick to your stomach (nauseated).   Avoid alcoholic beverages for 24 hours or as instructed.  MEDICATIONS  You may resume your normal medications unless your doctor tells you otherwise.  WHAT YOU CAN EXPECT TODAY  Some feelings of bloating in the abdomen.   Passage of more gas than usual.   Spotting of blood in your stool or on the toilet paper.  IF YOU HAD POLYPS REMOVED DURING THE COLONOSCOPY:  No aspirin products for 7 days or as instructed.   No alcohol for 7 days or as instructed.   Eat a soft diet for the next 24 hours.  FINDING OUT THE RESULTS OF YOUR TEST Not all test results are available during your visit. If your test results are not back during the visit, make an appointment  with your caregiver to find out the results. Do not assume everything is normal if you have not heard from your caregiver or the medical facility. It is important for you to follow up on all of your test results.  SEEK IMMEDIATE MEDICAL ATTENTION IF:  You have more than a spotting of blood in your stool.   Your belly is swollen (abdominal distention).   You are nauseated or vomiting.   You have a temperature over 101.   You have abdominal pain or discomfort that is severe or gets worse throughout the day.    Diverticulosis and colon polyp information provided  No future MRI until clips gone  Resume Xarelto today  Continue omeprazole 40 mg daily  Office visit with [place] in 6 weeks  Further recommendations to follow pending review of pathology report  I discussed my findings and recommendations with patient's daughter.   Diverticulosis  Diverticulosis is a condition that develops when small pouches (diverticula) form in the wall of the large intestine (colon). The colon is where water is absorbed and stool is formed. The pouches form when the inside layer of the colon pushes through weak spots in the outer layers of the colon. You may have a few pouches or many of them. What are the causes? The cause of this condition is not known. What increases the risk? The following factors may make you more likely to develop this condition:  Being older than age 48.  Your risk for this condition increases with age. Diverticulosis is rare among people younger than age 61. By age 48, many people have it.  Eating a low-fiber diet.  Having frequent constipation.  Being overweight.  Not getting enough exercise.  Smoking.  Taking over-the-counter pain medicines, like aspirin and ibuprofen.  Having a family history of diverticulosis. What are the signs or symptoms? In most people, there are no symptoms of this condition. If you do have symptoms, they may include:  Bloating.  Cramps in  the abdomen.  Constipation or diarrhea.  Pain in the lower left side of the abdomen. How is this diagnosed? This condition is most often diagnosed during an exam for other colon problems. Because diverticulosis usually has no symptoms, it often cannot be diagnosed independently. This condition may be diagnosed by:  Using a flexible scope to examine the colon (colonoscopy).  Taking an X-ray of the colon after dye has been put into the colon (barium enema).  Doing a CT scan. How is this treated? You may not need treatment for this condition if you have never developed an infection related to diverticulosis. If you have had an infection before, treatment may include:  Eating a high-fiber diet. This may include eating more fruits, vegetables, and grains.  Taking a fiber supplement.  Taking a live bacteria supplement (probiotic).  Taking medicine to relax your colon.  Taking antibiotic medicines. Follow these instructions at home:  Drink 6-8 glasses of water or more each day to prevent constipation.  Try not to strain when you have a bowel movement.  If you have had an infection before: ? Eat more fiber as directed by your health care provider or your diet and nutrition specialist (dietitian). ? Take a fiber supplement or probiotic, if your health care provider approves.  Take over-the-counter and prescription medicines only as told by your health care provider.  If you were prescribed an antibiotic, take it as told by your health care provider. Do not stop taking the antibiotic even if you start to feel better.  Keep all follow-up visits as told by your health care provider. This is important. Contact a health care provider if:  You have pain in your abdomen.  You have bloating.  You have cramps.  You have not had a bowel movement in 3 days. Get help right away if:  Your pain gets worse.  Your bloating becomes very bad.  You have a fever or chills, and your symptoms  suddenly get worse.  You vomit.  You have bowel movements that are bloody or black.  You have bleeding from your rectum. Summary  Diverticulosis is a condition that develops when small pouches (diverticula) form in the wall of the large intestine (colon).  You may have a few pouches or many of them.  This condition is most often diagnosed during an exam for other colon problems.  If you have had an infection related to diverticulosis, treatment may include increasing the fiber in your diet, taking supplements, or taking medicines. This information is not intended to replace advice given to you by your health care provider. Make sure you discuss any questions you have with your health care provider. Document Released: 11/21/2003 Document Revised: 02/05/2017 Document Reviewed: 01/13/2016 Elsevier Patient Education  [place].  Colon Polyps  Polyps are tissue growths inside the body. Polyps can grow in many places, including the large intestine (colon). A polyp may be a round bump or a mushroom-shaped growth. You could have  one polyp or several. Most colon polyps are noncancerous (benign). However, some colon polyps can become cancerous over time. Finding and removing the polyps early can help prevent this. What are the causes? The exact cause of colon polyps is not known. What increases the risk? You are more likely to develop this condition if you:  Have a family history of colon cancer or colon polyps.  Are older than 70 or older than 45 if you are African American.  Have inflammatory bowel disease, such as ulcerative colitis or Crohn's disease.  Have certain hereditary conditions, such as: ? Familial adenomatous polyposis. ? Lynch syndrome. ? Turcot syndrome. ? Peutz-Jeghers syndrome.  Are overweight.  Smoke cigarettes.  Do not get enough exercise.  Drink too much alcohol.  Eat a diet that is high in fat and red meat and low in fiber.  Had childhood cancer  that was treated with abdominal radiation. What are the signs or symptoms? Most polyps do not cause symptoms. If you have symptoms, they may include:  Blood coming from your rectum when having a bowel movement.  Blood in your stool. The stool may look dark red or black.  Abdominal pain.  A change in bowel habits, such as constipation or diarrhea. How is this diagnosed? This condition is diagnosed with a colonoscopy. This is a procedure in which a lighted, flexible scope is inserted into the anus and then passed into the colon to examine the area. Polyps are sometimes found when a colonoscopy is done as part of routine cancer screening tests. How is this treated? Treatment for this condition involves removing any polyps that are found. Most polyps can be removed during a colonoscopy. Those polyps will then be tested for cancer. Additional treatment may be needed depending on the results of testing. Follow these instructions at home: Lifestyle  Maintain a healthy weight, or lose weight if recommended by your health care provider.  Exercise every day or as told by your health care provider.  Do not use any products that contain nicotine or tobacco, such as cigarettes and e-cigarettes. If you need help quitting, ask your health care provider.  If you drink alcohol, limit how much you have: ? 0-1 drink a day for women. ? 0-2 drinks a day for men.  Be aware of how much alcohol is in your drink. In the [place] one drink equals one 12 oz bottle of beer (355 mL), one 5 oz glass of wine (148 mL), or one 1 oz shot of hard liquor (44 mL). Eating and drinking   Eat foods that are high in fiber, such as fruits, vegetables, and whole grains.  Eat foods that are high in calcium and vitamin D, such as milk, cheese, yogurt, eggs, liver, fish, and broccoli.  Limit foods that are high in fat, such as fried foods and desserts.  Limit the amount of red meat and processed meat you eat, such as hot  dogs, sausage, bacon, and lunch meats. General instructions  Keep all follow-up visits as told by your health care provider. This is important. ? This includes having regularly scheduled colonoscopies. ? Talk to your health care provider about when you need a colonoscopy. Contact a health care provider if:  You have new or worsening bleeding during a bowel movement.  You have new or increased blood in your stool.  You have a change in bowel habits.  You lose weight for no known reason. Summary  Polyps are tissue growths inside the body. Polyps  can grow in many places, including the colon.  Most colon polyps are noncancerous (benign), but some can become cancerous over time.  This condition is diagnosed with a colonoscopy.  Treatment for this condition involves removing any polyps that are found. Most polyps can be removed during a colonoscopy. This information is not intended to replace advice given to you by your health care provider. Make sure you discuss any questions you have with your health care provider. Document Released: 11/20/2003 Document Revised: 06/10/2017 Document Reviewed: 06/10/2017 Elsevier Patient Education  2020 [employer].

## 2018-10-07 NOTE — H&P (Signed)
@LOGO @   Primary Care Physician:  Sinda Du, MD Primary Gastroenterologist:  Dr. Gala Romney  Pre-Procedure History & Physical: HPI:  Diane Cohen is a 73 y.o. female here for here for average risk colorectal cancer screening via colonoscopy.  Negative colonoscopy elsewhere 10 years ago.  Intermittent epigastric pain for which EGD is also planned at the same time as colonoscopy.  Intermittent dysphagia.  States abdominal pain better since she is developed a more healthy diet and has lost 30 pounds in the past 3 months.  Xarelto held since Wednesday  Past Medical History:  Diagnosis Date  . Anxiety   . Atrial fibrillation ([place])   . Chronic diastolic heart failure ([place])   . Depression   . Dysrhythmia    a-fib  . Essential hypertension   . Hyperlipidemia   . Hypothyroidism   . Pneumonia   . TIA (transient ischemic attack)    12-13 years ago    Past Surgical History:  Procedure Laterality Date  . ABDOMINAL HYSTERECTOMY    . ABLATION OF DYSRHYTHMIC FOCUS  07/06/2017  . ATRIAL FIBRILLATION ABLATION N/A 07/06/2017   Procedure: ATRIAL FIBRILLATION ABLATION;  Surgeon: Thompson Grayer, MD;  Location: [facility];  Service: Cardiovascular;  Laterality: N/A;  . BREAST SURGERY     biopsy  . CARDIOVERSION N/A 01/23/2016   Procedure: CARDIOVERSION;  Surgeon: Satira Sark, MD;  Location: [facility];  Service: Cardiovascular;  Laterality: N/A;  . CARDIOVERSION N/A 11/13/2016   Procedure: CARDIOVERSION;  Surgeon: Arnoldo Lenis, MD;  Location: [facility];  Service: Endoscopy;  Laterality: N/A;  . CARDIOVERSION N/A 04/20/2018   Procedure: CARDIOVERSION;  Surgeon: [name], MD;  Location: [facility] ENDOSCOPY;  Service: Cardiovascular;  Laterality: N/A;  . CHOLECYSTECTOMY    . coloscopy    . TEE WITHOUT CARDIOVERSION  07/06/2017  . TEE WITHOUT CARDIOVERSION N/A 07/06/2017   Procedure: TRANSESOPHAGEAL ECHOCARDIOGRAM (TEE);  Surgeon: Josue Hector, MD;  Location: [facility]  ENDOSCOPY;  Service: Cardiovascular;  Laterality: N/A;    Prior to Admission medications   Medication Sig Start Date End Date Taking? Authorizing Provider  acetaminophen (TYLENOL) 650 MG CR tablet Take 1,300 mg every 8 (eight) hours as needed by mouth for pain.   Yes [provider]  diltiazem (CARDIZEM CD) 240 MG 24 hr capsule TAKE 1 CAPSULE(240 MG) BY MOUTH DAILY Patient taking differently: Take 240 mg by mouth daily.  04/29/18  Yes Satira Sark, MD  diltiazem (CARDIZEM) 30 MG tablet Take 1 tablet (30 mg total) by mouth 4 (four) times daily as needed (for atrial fibrillation). 10/06/17  Yes Allred, Jeneen Rinks, MD  DULoxetine (CYMBALTA) 60 MG capsule Take 60 mg by mouth daily.   Yes [provider]  levothyroxine (SYNTHROID, LEVOTHROID) 137 MCG tablet Take 137 mcg by mouth daily before breakfast.  11/04/16  Yes [provider]  lisinopril (PRINIVIL,ZESTRIL) 20 MG tablet Take 1 tablet (20 mg total) by mouth daily. 04/05/13  Yes Rai, Ripudeep K, MD  metolazone (ZAROXOLYN) 5 MG tablet Take 5 mg by mouth 2 (two) times a week.    Yes [provider]  metoprolol succinate (TOPROL-XL) 100 MG 24 hr tablet Take 100 mg by mouth daily. 04/06/18  Yes [provider]  omeprazole (PRILOSEC) 40 MG capsule Take 40 mg daily by mouth.  11/14/15  Yes [provider]  potassium chloride SA (K-DUR,KLOR-CON) 20 MEQ tablet Take 2 tablets (40 mEq total) by mouth daily. 08/13/15  Yes Jory Sims  M, NP  torsemide (DEMADEX) 20 MG tablet TAKE 2 TABLETS BY MOUTH EVERY MORNING Patient taking differently: Take 40 mg by mouth daily.  10/03/18  Yes Satira Sark, MD  XARELTO 20 MG TABS tablet TAKE 1 TABLET BY MOUTH EVERY DAY WITH DINNER Patient taking differently: Take 20 mg by mouth daily with supper.  03/22/18  Yes Satira Sark, MD    Allergies as of 05/17/2018 - Review Complete 05/12/2018  Allergen Reaction Noted  . Levaquin [levofloxacin] Other (See Comments)  03/31/2013  . Sulfa antibiotics Hives 03/31/2013    Family History  Problem Relation Age of Onset  . Hypertension Other   . Hypertension Father   . Hypertension Mother   . Anemia Sister   . Hypertension Sister   . Colon cancer Neg Hx   . Colon polyps Neg Hx     Social History   Socioeconomic History  . Marital status: Married    Spouse name: Not on file  . Number of children: Not on file  . Years of education: Not on file  . Highest education level: Not on file  Occupational History  . Not on file  Social Needs  . Financial resource strain: Not on file  . Food insecurity    Worry: Not on file    Inability: Not on file  . Transportation needs    Medical: Not on file    Non-medical: Not on file  Tobacco Use  . Smoking status: Former Smoker    Packs/day: 0.50    Types: Cigarettes    Quit date: 01/22/1973    Years since quitting: 45.7  . Smokeless tobacco: Never Used  Substance and Sexual Activity  . Alcohol use: Yes    Alcohol/week: 0.0 standard drinks    Comment: 3 times a year: Christmas, Thanksgiving, and maybe just because   . Drug use: No  . Sexual activity: Yes    Birth control/protection: Surgical  Lifestyle  . Physical activity    Days per week: Not on file    Minutes per session: Not on file  . Stress: Not on file  Relationships  . Social Herbalist on phone: Not on file    Gets together: Not on file    Attends religious service: Not on file    Active member of club or organization: Not on file    Attends meetings of clubs or organizations: Not on file    Relationship status: Not on file  . Intimate partner violence    Fear of current or ex partner: Not on file    Emotionally abused: Not on file    Physically abused: Not on file    Forced sexual activity: Not on file  Other Topics Concern  . Not on file  Social History Narrative   Lives with spouse in [place] has ALS and is dependant on the patient for transition from  bed to chair.    Review of Systems: See HPI, otherwise negative ROS  Physical Exam: BP (!) 137/58   Pulse 78   Temp 98.3 F (36.8 C) (Oral)   Resp 19   Ht 5\' 1"  (1.549 m)   Wt 74.8 kg   SpO2 98%   BMI 31.18 kg/m  General:   Alert,  Well-developed, well-nourished, pleasant and cooperative in NAD Mouth:  No deformity or lesions. Neck:  Supple; no masses or thyromegaly. No significant cervical adenopathy. Lungs:  Clear throughout to auscultation.   No  wheezes, crackles, or rhonchi. No acute distress. Heart:  Regular rate and rhythm; no murmurs, clicks, rubs,  or gallops. Abdomen: Non-distended, normal bowel sounds.  Soft and nontender without appreciable mass or hepatosplenomegaly.  Pulses:  Normal pulses noted. Extremities:  Without clubbing or edema.  Impression/Plan: 73 year old lady with intermittent epigastric pain vague esophageal dysphagia.  In need of average risk colorectal cancer screening. Above the patient a EGD with esophageal dilation as feasible/appropriate per plan.  In addition, average rescreening colonoscopy today. Epigastric pain notably better in association with significant weight loss recently. The risks, benefits, limitations, imponderables and alternatives regarding both EGD and colonoscopy have been reviewed with the patient. Questions have been answered. All parties agreeable.       Notice: This dictation was prepared with Dragon dictation along with smaller phrase technology. Any transcriptional errors that result from this process are unintentional and may not be corrected upon review.

## 2018-10-07 NOTE — Op Note (Signed)
[facility] Patient Name: Diane Cohen Procedure Date: 10/07/2018 12:19 PM MRN: [id number] Date of Birth: [birth year] Attending MD: Norvel Richards , MD CSN: [id number] Age: 73 Admit Type: Outpatient Procedure:                Upper GI endoscopy Indications:              Epigastric abdominal pain, Dysphagia Providers:                Norvel Richards, MD, Hinton Rao, RN,                            Raphael Gibney, Technician Referring MD:              Medicines:                Midazolam 4 mg IV, Meperidine 50 mg IV Complications:            No immediate complications. Estimated Blood Loss:     Estimated blood loss was minimal. Estimated blood                            loss: none. Procedure:                Pre-Anesthesia Assessment:                           - Prior to the procedure, a History and Physical                            was performed, and patient medications and                            allergies were reviewed. The patient's tolerance of                            previous anesthesia was also reviewed. The risks                            and benefits of the procedure and the sedation                            options and risks were discussed with the patient.                            All questions were answered, and informed consent                            was obtained. Prior Anticoagulants: The patient                            last took Xarelto (rivaroxaban) 2 days prior to the                            procedure. ASA Grade Assessment: III - A patient  with severe systemic disease. After reviewing the                            risks and benefits, the patient was deemed in                            satisfactory condition to undergo the procedure.                           After obtaining informed consent, the endoscope was                            passed under direct vision. Throughout the   procedure, the patient's blood pressure, pulse, and                            oxygen saturations were monitored continuously. The                            GIF-H190 ([id number]) scope was introduced through the                            mouth, and advanced to the second part of duodenum.                            The upper GI endoscopy was accomplished without                            difficulty. The patient tolerated the procedure                            well. Scope In: 12:42:41 PM Scope Out: 40:81:44 PM Total Procedure Duration: 0 hours 5 minutes 31 seconds  Findings:      The examined esophagus was normal.      The entire examined stomach was normal.      The duodenal bulb and second portion of the duodenum were normal. The       scope was withdrawn. Dilation was performed with a Maloney dilator with       mild resistance at 15 Fr. The dilation site was examined following       endoscope reinsertion and showed no change. Estimated blood loss was       minimal. Impression:               - Normal esophagus. Dilated.                           - Normal stomach.                           - Normal duodenal bulb and second portion of the                            duodenum.                           -  No specimens collected. Moderate Sedation:      Moderate (conscious) sedation was administered by the endoscopy nurse       and supervised by the endoscopist. The following parameters were       monitored: oxygen saturation, heart rate, blood pressure, respiratory       rate, EKG, adequacy of pulmonary ventilation, and response to care.       Total physician intraservice time was 14 minutes. Recommendation:           - Patient has a contact number available for                            emergencies. The signs and symptoms of potential                            delayed complications were discussed with the                            patient. Return to normal activities tomorrow.                             Written discharge instructions were provided to the                            patient.                           - Resume previous diet.                           - Continue present medications. Continue omeprazole                            40 mg daily. Resume Xarelto today.                           - Patient has a contact number available for                            emergencies. The signs and symptoms of potential                            delayed complications were discussed with the                            patient. Return to normal activities tomorrow.                            Written discharge instructions were provided to the                            patient.                           - Advance diet as tolerated. See colonoscopy report. Procedure Code(s):        --- Professional ---  23762, Esophagogastroduodenoscopy, flexible,                            transoral; diagnostic, including collection of                            specimen(s) by brushing or washing, when performed                            (separate procedure)                           43450, Dilation of esophagus, by unguided sound or                            bougie, single or multiple passes                           G0500, Moderate sedation services provided by the                            same physician or other qualified health care                            professional performing a gastrointestinal                            endoscopic service that sedation supports,                            requiring the presence of an independent trained                            observer to assist in the monitoring of the                            patient's level of consciousness and physiological                            status; initial 15 minutes of intra-service time;                            patient age 38 years or older (additional time may                             be reported with [id number], as appropriate) Diagnosis Code(s):        --- Professional ---                           R10.13, Epigastric pain                           R13.10, Dysphagia, unspecified CPT copyright 2019 American Medical Association. All rights reserved. The codes documented in this report are preliminary and upon coder review may  be revised to meet current compliance requirements.  Cristopher Estimable. Trevonn Hallum, MD Norvel Richards, MD 10/07/2018 12:53:26 PM This report has been signed electronically. Number of Addenda: 0

## 2018-10-11 ENCOUNTER — Encounter: Payer: Self-pay | Admitting: Internal Medicine

## 2018-10-18 ENCOUNTER — Encounter (HOSPITAL_COMMUNITY): Payer: Self-pay | Admitting: Internal Medicine

## 2018-12-13 ENCOUNTER — Encounter: Payer: Self-pay | Admitting: *Deleted

## 2018-12-13 ENCOUNTER — Other Ambulatory Visit: Payer: Self-pay | Admitting: *Deleted

## 2018-12-13 ENCOUNTER — Other Ambulatory Visit: Payer: Self-pay

## 2018-12-13 ENCOUNTER — Ambulatory Visit (INDEPENDENT_AMBULATORY_CARE_PROVIDER_SITE_OTHER): Payer: Medicare PPO | Admitting: Gastroenterology

## 2018-12-13 ENCOUNTER — Encounter: Payer: Self-pay | Admitting: Gastroenterology

## 2018-12-13 VITALS — BP 143/66 | HR 69 | Temp 96.8°F | Ht 61.0 in | Wt 166.0 lb

## 2018-12-13 DIAGNOSIS — R1013 Epigastric pain: Secondary | ICD-10-CM | POA: Diagnosis not present

## 2018-12-13 DIAGNOSIS — K5900 Constipation, unspecified: Secondary | ICD-10-CM | POA: Insufficient documentation

## 2018-12-13 DIAGNOSIS — Z860100 Personal history of colon polyps, unspecified: Secondary | ICD-10-CM | POA: Insufficient documentation

## 2018-12-13 MED ORDER — PANTOPRAZOLE SODIUM 40 MG PO TBEC: 40.0000 mg | DELAYED_RELEASE_TABLET | Freq: Every day | ORAL | 3 refills | Status: DC

## 2018-12-13 MED ORDER — PEG 3350-KCL-NA BICARB-NACL 420 G PO SOLR: 4000.0000 mL | Freq: Once | ORAL | 0 refills | Status: AC

## 2018-12-13 NOTE — Progress Notes (Signed)
Primary Care Physician:  Sinda Du, MD Primary GI: Dr. Gala Romney   Chief Complaint  Patient presents with  . pp f/u    HPI:   Stepahnie Callaway Cohen is a 73 y.o. female presenting today with a history of adenomas and one advanced adenoma on colonoscopy July 2020, returning for visit to arrange early interval 6 month colonoscopy.   Dealing with constipation for about 6-8 months. [place] stool scale #1-2. Upper abdominal pain, no burning. "hurts". Nothing triggers the pain. Will take Tums to help if severe. Has pain at bra line. Feels like ballooning or swelling. Purposeful weight loss noted. No overt rectal bleeding. On Xarelto for afib. Prilosec chronically.   Past Medical History:  Diagnosis Date  . Anxiety   . Atrial fibrillation ([place])   . Chronic diastolic heart failure ([place])   . Depression   . Dysrhythmia    a-fib  . Essential hypertension   . Hyperlipidemia   . Hypothyroidism   . Pneumonia   . TIA (transient ischemic attack)    12-13 years ago    Past Surgical History:  Procedure Laterality Date  . ABDOMINAL HYSTERECTOMY    . ABLATION OF DYSRHYTHMIC FOCUS  07/06/2017  . ATRIAL FIBRILLATION ABLATION N/A 07/06/2017   Procedure: ATRIAL FIBRILLATION ABLATION;  Surgeon: Thompson Grayer, MD;  Location: [place] [facility];  Service: Cardiovascular;  Laterality: N/A;  . BREAST SURGERY     biopsy  . CARDIOVERSION N/A 01/23/2016   Procedure: CARDIOVERSION;  Surgeon: Satira Sark, MD;  [facility];  Service: Cardiovascular;  Laterality: N/A;  . CARDIOVERSION N/A 11/13/2016   Procedure: CARDIOVERSION;  Surgeon: Arnoldo Lenis, MD;  [facility];  Service: Endoscopy;  Laterality: N/A;  . CARDIOVERSION N/A 04/20/2018   Procedure: CARDIOVERSION;  Surgeon: [name], MD;  Location: [facility] ENDOSCOPY;  Service: Cardiovascular;  Laterality: N/A;  . CHOLECYSTECTOMY    . COLONOSCOPY N/A 10/07/2018   Sigmoid and descending colon diverticulosis, three 6 to 15  mm polyps in rectum, ascending colon, and IC valve, one 6 mm polyp in cecum. (sessile serrated polyps and sessile serrated adenoma of ascending colon with focal high grade dysplasia.  . coloscopy    . ESOPHAGEAL DILATION  10/07/2018   Procedure: ESOPHAGEAL DILATION;  Surgeon: Daneil Dolin, MD;  [facility];  Service: Endoscopy;;  . ESOPHAGOGASTRODUODENOSCOPY N/A 10/07/2018   normal esophagus s/p dilation  . POLYPECTOMY  10/07/2018   Procedure: POLYPECTOMY;  Surgeon: Daneil Dolin, MD;  [facility];  Service: Endoscopy;;  Cecal polyps x 2 hot snare Ascending polyp x 1 Hot snare Rectal polyp x 1  . TEE WITHOUT CARDIOVERSION  07/06/2017  . TEE WITHOUT CARDIOVERSION N/A 07/06/2017   Procedure: TRANSESOPHAGEAL ECHOCARDIOGRAM (TEE);  Surgeon: Josue Hector, MD;  Location[facility] END[facility];  Service: Cardiovascular;  Laterality: N/A;    Current Outpatient Medications  Medication Sig Dispense Refill  . acetaminophen (TYLENOL) 650 MG CR tablet Take 1,300 mg every 8 (eight) hours as needed by mouth for pain.    Marland Kitchen diltiazem (CARDIZEM CD) 240 MG 24 hr capsule TAKE 1 CAPSULE(240 MG) BY MOUTH DAILY (Patient taking differently: Take 240 mg by mouth daily. ) 90 capsule 3  . diltiazem (CARDIZEM) 30 MG tablet Take 1 tablet (30 mg total) by mouth 4 (four) times daily as needed (for atrial fibrillation). 60 tablet 3  . DULoxetine (CYMBALTA) 60 MG capsule Take 60 mg by mouth daily.    Marland Kitchen  levothyroxine (SYNTHROID, LEVOTHROID) 137 MCG tablet Take 137 mcg by mouth daily before breakfast.     . lisinopril (PRINIVIL,ZESTRIL) 20 MG tablet Take 1 tablet (20 mg total) by mouth daily.    . metolazone (ZAROXOLYN) 5 MG tablet Take 5 mg by mouth 2 (two) times a week.     . metoprolol succinate (TOPROL-XL) 100 MG 24 hr tablet Take 100 mg by mouth daily.    Marland Kitchen omeprazole (PRILOSEC) 40 MG capsule Take 40 mg daily by mouth.     . potassium chloride SA (K-DUR,KLOR-CON) 20 MEQ tablet Take 2 tablets (40 mEq  total) by mouth daily. 180 tablet 3  . torsemide (DEMADEX) 20 MG tablet TAKE 2 TABLETS BY MOUTH EVERY MORNING (Patient taking differently: Take 40 mg by mouth daily. ) 180 tablet 1  . XARELTO 20 MG TABS tablet TAKE 1 TABLET BY MOUTH EVERY DAY WITH DINNER (Patient taking differently: Take 20 mg by mouth daily with supper. ) 30 tablet 11  . pantoprazole (PROTONIX) 40 MG tablet Take 1 tablet (40 mg total) by mouth daily. 30 minutes before breakfast 30 tablet 3   No current facility-administered medications for this visit.     Allergies as of 12/13/2018 - Review Complete 12/13/2018  Allergen Reaction Noted  . Levaquin [levofloxacin] Other (See Comments) 03/31/2013  . Sulfa antibiotics Hives 03/31/2013    Family History  Problem Relation Age of Onset  . Hypertension Other   . Hypertension Father   . Hypertension Mother   . Anemia Sister   . Hypertension Sister   . Colon cancer Neg Hx   . Colon polyps Neg Hx     Social History   Socioeconomic History  . Marital status: Married    Spouse name: Not on file  . Number of children: Not on file  . Years of education: Not on file  . Highest education level: Not on file  Occupational History  . Not on file  Social Needs  . Financial resource strain: Not on file  . Food insecurity    Worry: Not on file    Inability: Not on file  . Transportation needs    Medical: Not on file    Non-medical: Not on file  Tobacco Use  . Smoking status: Former Smoker    Packs/day: 0.50    Types: Cigarettes    Quit date: 01/22/1973    Years since quitting: 45.9  . Smokeless tobacco: Never Used  Substance and Sexual Activity  . Alcohol use: Yes    Alcohol/week: 0.0 standard drinks    Comment: 3 times a year: Christmas, Thanksgiving, and maybe just because   . Drug use: No  . Sexual activity: Yes    Birth control/protection: Surgical  Lifestyle  . Physical activity    Days per week: Not on file    Minutes per session: Not on file  . Stress:  Not on file  Relationships  . Social Herbalist on phone: Not on file    Gets together: Not on file    Attends religious service: Not on file    Active member of club or organization: Not on file    Attends meetings of clubs or organizations: Not on file    Relationship status: Not on file  Other Topics Concern  . Not on file  Social History Narrative   Lives with spouse in [place] has ALS and is dependant on the patient for transition from bed to chair.  Review of Systems: Gen: Denies fever, chills, anorexia. Denies fatigue, weakness, weight loss.  CV: Denies chest pain, palpitations, syncope, peripheral edema, and claudication. Resp: Denies dyspnea at rest, cough, wheezing, coughing up blood, and pleurisy. GI: see HPI Derm: Denies rash, itching, dry skin Psych: Denies depression, anxiety, memory loss, confusion. No homicidal or suicidal ideation.  Heme: Denies bruising, bleeding, and enlarged lymph nodes.  Physical Exam: BP (!) 143/66   Pulse 69   Temp (!) 96.8 F (36 C) (Temporal)   Ht 5\' 1"  (1.549 m)   Wt 166 lb (75.3 kg)   BMI 31.37 kg/m  General:   Alert and oriented. No distress noted. Pleasant and cooperative.  Head:  Normocephalic and atraumatic. Eyes:  Conjuctiva clear without scleral icterus. Lungs: clear bilaterally Cardiac: S1 S2 present, irregularly irregular  Abdomen:  +BS, soft, mild TTP upper abdomen and non-distended. No rebound or guarding. No HSM or masses noted. Msk:  Symmetrical without gross deformities. Normal posture. Extremities:  Without edema. Neurologic:  Alert and  oriented x4 Psych:  Alert and cooperative. Normal mood and affect.

## 2018-12-13 NOTE — Patient Instructions (Signed)
Let's stop Prilosec. Start Protonix once each morning, 30 minutes before breakfast.   For constipation: take 2 teaspoons of Benefiber each morning. You can mix in your coffee or other liquid. We can increase this if needed. Tomorrow, we will have samples of Linzess you can take. You will take this once each morning on an empty stomach, 30 minutes before breakfast. You may have loose stool starting out but should get better. If not, call us. Also, if this is not strong enough, call us so we can adjust dosing.  We have arranged a CT scan due to the persistent abdominal pain.  We have arranged a colonoscopy in Jan 2021 to follow-up on polyps. PLEASE STOP XARELTO 24 hours before the procedure.  I enjoyed seeing you again today! As you know, I value our relationship and want to provide genuine, compassionate, and quality care. I welcome your feedback. If you receive a survey regarding your visit,  I greatly appreciate you taking time to fill this out. See you next time!  Annitta Needs, PhD, ANP-BC [facility]

## 2018-12-15 ENCOUNTER — Encounter: Payer: Self-pay | Admitting: Gastroenterology

## 2018-12-15 NOTE — Assessment & Plan Note (Signed)
73 year old female with history of adenomas and one advanced adenoma with high grade dysplasia on colonoscopy July 2020, now with need for early interval 6 month surveillance. No concerning lower GI signs/symptoms. On Xarelto chronically. Will have her hold this 24 hours before as previously recommended by cardiology.  Proceed with TCS with Dr. Gala Romney in near future: the risks, benefits, and alternatives have been discussed with the patient in detail. The patient states understanding and desires to proceed. HOLD XARELTO 24 hours prior

## 2018-12-15 NOTE — Assessment & Plan Note (Addendum)
Trial samples of Linzess 72 mcg daily. Call with progress report.

## 2018-12-15 NOTE — Assessment & Plan Note (Signed)
EGD on file recently and normal. Chronically on Prilosec. Will change to Protonix for now. Purposeful weight loss noted. Gallbladder absent. CT planned in near future.

## 2018-12-16 ENCOUNTER — Telehealth: Payer: Self-pay | Admitting: *Deleted

## 2018-12-16 NOTE — Telephone Encounter (Signed)
PA sent for clinical review via [employer]. Clinicals faxed. Ref# [id number]

## 2018-12-19 ENCOUNTER — Telehealth: Payer: Self-pay | Admitting: Internal Medicine

## 2018-12-19 MED ORDER — NA SULFATE-K SULFATE-MG SULF 17.5-3.13-1.6 GM/177ML PO SOLN: 1.0000 | Freq: Once | ORAL | 0 refills | Status: AC

## 2018-12-19 NOTE — Addendum Note (Signed)
Addended by: Cheron Every on: 12/19/2018 03:11 PM   Modules accepted: Orders

## 2018-12-19 NOTE — Telephone Encounter (Signed)
See prior note

## 2018-12-19 NOTE — Telephone Encounter (Signed)
Patient called back. She is aware insurance does not cover suprep but will send this in. Aware will mail new instructions.

## 2018-12-19 NOTE — Telephone Encounter (Signed)
[phone] PATIENT RETURNED CALL, PLEASE CALL BACK

## 2018-12-19 NOTE — Telephone Encounter (Signed)
PATIENT WANTS THE SUPREP FOR HER PROCEDURE THIS TIME, THAT IS WHAT SHE USED LAST TCS.  SHE SAID THAT SHE DOES NOT WANT TO HAVE THE PREP WITH THE HUGE AMOUNT TO [place]

## 2018-12-19 NOTE — Telephone Encounter (Signed)
LMOVM for pt 

## 2018-12-19 NOTE — Telephone Encounter (Signed)
PA approved. Auth# [id number] dates 12/28/2018-01/27/2019

## 2018-12-22 DIAGNOSIS — R1013 Epigastric pain: Secondary | ICD-10-CM | POA: Diagnosis not present

## 2018-12-23 LAB — BUN: BUN: 15 mg/dL (ref 7–25)

## 2018-12-23 LAB — CREATININE, SERUM: Creat: 0.94 mg/dL — ABNORMAL HIGH (ref 0.60–0.93)

## 2018-12-28 ENCOUNTER — Ambulatory Visit (HOSPITAL_COMMUNITY): Payer: Medicare PPO

## 2019-01-25 ENCOUNTER — Telehealth: Payer: Self-pay | Admitting: *Deleted

## 2019-01-25 NOTE — Telephone Encounter (Signed)
PA approved via [employer]. Auth# [id number] dates 01/30/2019-03/01/2019

## 2019-01-25 NOTE — Telephone Encounter (Signed)
Received call patient auth for CT expires 11/20 and patient is scheduled for 11/23. Patient CT was originally scheduled in October and she rescheduled this. PA done via [employer]. Pending tracking# [id number]. Clinicals faxed

## 2019-01-30 ENCOUNTER — Ambulatory Visit (HOSPITAL_COMMUNITY): Payer: Medicare PPO

## 2019-03-08 ENCOUNTER — Other Ambulatory Visit: Payer: Self-pay

## 2019-03-08 MED ORDER — RIVAROXABAN 20 MG PO TABS: ORAL_TABLET | ORAL | 11 refills | Status: DC

## 2019-03-13 ENCOUNTER — Other Ambulatory Visit (HOSPITAL_COMMUNITY)
Admission: RE | Admit: 2019-03-13 | Discharge: 2019-03-13 | Disposition: A | Discharge status: Home / Self Care | Payer: Medicare PPO | Source: Ambulatory Visit | Attending: Internal Medicine | Admitting: Internal Medicine

## 2019-03-13 ENCOUNTER — Telehealth: Payer: Self-pay | Admitting: Internal Medicine

## 2019-03-13 ENCOUNTER — Other Ambulatory Visit (HOSPITAL_COMMUNITY): Payer: Medicare PPO

## 2019-03-13 ENCOUNTER — Other Ambulatory Visit: Payer: Self-pay

## 2019-03-13 NOTE — Telephone Encounter (Signed)
Pt wants to cancel her procedure with [facility]MR on 03/15/2019, She does not want to reschedule.

## 2019-03-13 NOTE — Telephone Encounter (Signed)
Called patient. Confirmed she wants to cancel and she did not want to r/s. Reports she needs to get some things straight before she does anything. Endo aware to cancel. FYI to AB

## 2019-03-13 NOTE — Telephone Encounter (Signed)
LMOVM

## 2019-03-15 ENCOUNTER — Ambulatory Visit (HOSPITAL_COMMUNITY): Admission: RE | Admit: 2019-03-15 | Payer: Medicare PPO | Source: Home / Self Care | Admitting: Internal Medicine

## 2019-03-15 ENCOUNTER — Encounter (HOSPITAL_COMMUNITY): Admission: RE | Payer: Self-pay | Source: Home / Self Care

## 2019-03-15 SURGERY — COLONOSCOPY
Anesthesia: Moderate Sedation

## 2019-04-20 ENCOUNTER — Ambulatory Visit: Payer: Medicare PPO | Admitting: Cardiology

## 2019-05-08 ENCOUNTER — Encounter: Payer: Self-pay | Admitting: Cardiology

## 2019-05-08 ENCOUNTER — Encounter: Payer: Self-pay | Admitting: Internal Medicine

## 2019-05-08 ENCOUNTER — Telehealth: Payer: Self-pay

## 2019-05-08 NOTE — Telephone Encounter (Signed)
Virtual Visit Pre-Appointment Phone Call  "(Name), I am calling you today to discuss your upcoming appointment. We are currently trying to limit exposure to the virus that causes COVID-19 by seeing patients at home rather than in the office."  1. "What is the BEST phone number to call the day of the visit?" - include this in appointment notes  2. "Do you have or have access to (through a family member/friend) a smartphone with video capability that we can use for your visit?" a. If yes - list this number in appt notes as "cell" (if different from BEST phone #) and list the appointment type as a VIDEO visit in appointment notes b. If no - list the appointment type as a PHONE visit in appointment notes  Confirm consent - "In the setting of the current Covid19 crisis, you are scheduled for a (phone or video) visit with your provider on (date) at (time).  Just as we do with many in-office visits, in order for you to participate in this visit, we must obtain consent.  If you'd like, I can send this to your mychart (if signed up) or email for you to review.  Otherwise, I can obtain your verbal consent now.  All virtual visits are billed to your insurance company just like a normal visit would be.  By agreeing to a virtual visit, we'd like you to understand that the technology does not allow for your provider to perform an examination, and thus may limit your provider's ability to fully assess your condition. If your provider identifies any concerns that need to be evaluated in person, we will make arrangements to do so.  Finally, though the technology is pretty good, we cannot assure that it will always work on either your or our end, and in the setting of a video visit, we may have to convert it to a phone-only visit.  In either situation, we cannot ensure that we have a secure connection.  Are you willing to proceed?" STAFF: Did the patient verbally acknowledge consent to telehealth visit? Document  YES/NO here:  3. Advise patient to be prepared - "Two hours prior to your appointment, go ahead and check your blood pressure, pulse, oxygen saturation, and your weight (if you have the equipment to check those) and write them all down. When your visit starts, your provider will ask you for this information. If you have an Apple Watch or Kardia device, please plan to have heart rate information ready on the day of your appointment. Please have a pen and paper handy nearby the day of the visit as well."  4. Give patient instructions for MyChart download to smartphone OR Doximity/Doxy.me as below if video visit (depending on what platform provider is using)  5. Inform patient they will receive a phone call 15 minutes prior to their appointment time (may be from unknown caller ID) so they should be prepared to answer    TELEPHONE CALL NOTE  Diane Cohen has been deemed a candidate for a follow-up tele-health visit to limit community exposure during the Covid-19 pandemic. I spoke with the patient via phone to ensure availability of phone/video source, confirm preferred email & phone number, and discuss instructions and expectations.  I reminded Diane Cohen to be prepared with any vital sign and/or heart rhythm information that could potentially be obtained via home monitoring, at the time of her visit. I reminded Diane Cohen to expect a phone call prior to her visit.  Dorothey Baseman 05/08/2019 9:46 AM   INSTRUCTIONS FOR DOWNLOADING THE MYCHART APP TO SMARTPHONE  - The patient must first make sure to have activated MyChart and know their login information - If Apple, go to App Store and type in MyChart in the search bar and download the app. If Android, ask patient to go to [employer] and type in [place] in the search bar and download the app. The app is free but as with any other app downloads, their phone may require them to verify saved payment information or Apple/Android password.   - The patient will need to then log into the app with their MyChart username and password, and select [facility] as their healthcare provider to link the account. When it is time for your visit, go to the MyChart app, find appointments, and click Begin Video Visit. Be sure to Select Allow for your device to access the Microphone and Camera for your visit. You will then be connected, and your provider will be with you shortly.  **If they have any issues connecting, or need assistance please contact MyChart service desk [phone])**  **If using a computer, in order to ensure the best quality for their visit they will need to use either of the following Internet Browsers: [employer], or Google Chrome**  IF USING DOXIMITY or DOXY.ME - The patient will receive a link just prior to their visit by text.     FULL LENGTH CONSENT FOR TELE-HEALTH VISIT   I hereby voluntarily request, consent and authorize [facility] and its employed or contracted physicians, physician assistants, nurse practitioners or other licensed health care professionals (the Practitioner), to provide me with telemedicine health care services (the "Services") as deemed necessary by the treating Practitioner. I acknowledge and consent to receive the Services by the Practitioner via telemedicine. I understand that the telemedicine visit will involve communicating with the Practitioner through live audiovisual communication technology and the disclosure of certain medical information by electronic transmission. I acknowledge that I have been given the opportunity to request an in-person assessment or other available alternative prior to the telemedicine visit and am voluntarily participating in the telemedicine visit.  I understand that I have the right to withhold or withdraw my consent to the use of telemedicine in the course of my care at any time, without affecting my right to future care or treatment, and that  the Practitioner or I may terminate the telemedicine visit at any time. I understand that I have the right to inspect all information obtained and/or recorded in the course of the telemedicine visit and may receive copies of available information for a reasonable fee.  I understand that some of the potential risks of receiving the Services via telemedicine include:  Marland Kitchen Delay or interruption in medical evaluation due to technological equipment failure or disruption; . Information transmitted may not be sufficient (e.g. poor resolution of images) to allow for appropriate medical decision making by the Practitioner; and/or  . In rare instances, security protocols could fail, causing a breach of personal health information.  Furthermore, I acknowledge that it is my responsibility to provide information about my medical history, conditions and care that is complete and accurate to the best of my ability. I acknowledge that Practitioner's advice, recommendations, and/or decision may be based on factors not within their control, such as incomplete or inaccurate data provided by me or distortions of diagnostic images or specimens that may result from electronic transmissions. I understand that the practice  of medicine is not an Chief Strategy Officer and that Practitioner makes no warranties or guarantees regarding treatment outcomes. I acknowledge that I will receive a copy of this consent concurrently upon execution via email to the email address I last provided but may also request a printed copy by calling the office of [facility].    I understand that my insurance will be billed for this visit.   I have read or had this consent read to me. . I understand the contents of this consent, which adequately explains the benefits and risks of the Services being provided via telemedicine.  . I have been provided ample opportunity to ask questions regarding this consent and the Services and have had my questions answered to  my satisfaction. . I give my informed consent for the services to be provided through the use of telemedicine in my medical care  By participating in this telemedicine visit I agree to the above.

## 2019-05-08 NOTE — Progress Notes (Signed)
Virtual Visit via Telephone Note   This visit type was conducted due to national recommendations for restrictions regarding the COVID-19 Pandemic (e.g. social distancing) in an effort to limit this patient's exposure and mitigate transmission in our community.  Due to her co-morbid illnesses, this patient is at least at moderate risk for complications without adequate follow up.  This format is felt to be most appropriate for this patient at this time.  The patient did not have access to video technology/had technical difficulties with video requiring transitioning to audio format only (telephone).  All issues noted in this document were discussed and addressed.  No physical exam could be performed with this format.  Please refer to the patient's chart for her  consent to telehealth for [facility].  Date:  05/09/2019   ID:  Diane Cohen, DOB [birth year], MRN [id number]  Patient Location: Home Provider Location: Office  PCP:  Sinda Du, MD  Cardiologist:  Rozann Lesches, MD Electrophysiologist:  Thompson Grayer, MD   Evaluation Performed:  Follow-Up Visit  Chief Complaint:  Cardiac follow-up  History of Present Illness:    Diane Cohen is a 74 y.o. female last seen in May 2019.  We spoke by phone today.  She reports occasional palpitations and use of short acting Cardizem 30 mg tablets, but otherwise no change in her baseline regimen which includes Cardizem CD and Toprol-XL.  She reports no spontaneous bleeding problems or changes in stool on Xarelto.  She has maintained interval follow-up in the atrial fibrillation clinic and with Dr. Rayann Heman.  She has a history of atrial fibrillation ablation in May 2019.  We discussed arranging follow-up lab work on [place].  She plans to establish with Dr. Nevada Crane for primary care now that Dr. Luan Pulling has retired.  The patient does not have symptoms concerning for COVID-19 infection (fever, chills, cough, or new shortness of breath).  She plans  to get a vaccine.   Past Medical History:  Diagnosis Date  . Anxiety   . Atrial fibrillation ([place])   . Chronic diastolic heart failure ([place])   . Depression   . Essential hypertension   . Hyperlipidemia   . Hypothyroidism   . Pneumonia   . TIA (transient ischemic attack)    12-13 years ago   Past Surgical History:  Procedure Laterality Date  . ABDOMINAL HYSTERECTOMY    . ABLATION OF DYSRHYTHMIC FOCUS  07/06/2017  . ATRIAL FIBRILLATION ABLATION N/A 07/06/2017   Procedure: ATRIAL FIBRILLATION ABLATION;  Surgeon: Thompson Grayer, MD;  Location: [facility] LAB;  Service: Cardiovascular;  Laterality: N/A;  . BREAST SURGERY     biopsy  . CARDIOVERSION N/A 01/23/2016   Procedure: CARDIOVERSION;  Surgeon: Satira Sark, MD;  [facility];  Service: Cardiovascular;  Laterality: N/A;  . CARDIOVERSION N/A 11/13/2016   Procedure: CARDIOVERSION;  Surgeon: Arnoldo Lenis, MD;  [facility];  Service: Endoscopy;  Laterality: N/A;  . CARDIOVERSION N/A 04/20/2018   Procedure: CARDIOVERSION;  Surgeon: [name], MD;  Location[facility] ENDOSCOPY;  Service: Cardiovascular;  Laterality: N/A;  . CHOLECYSTECTOMY    . COLONOSCOPY N/A 10/07/2018   Sigmoid and descending colon diverticulosis, three 6 to 15 mm polyps in rectum, ascending colon, and IC valve, one 6 mm polyp in cecum. (sessile serrated polyps and sessile serrated adenoma of ascending colon with focal high grade dysplasia.  . coloscopy    . ESOPHAGEAL DILATION  10/07/2018   Procedure: ESOPHAGEAL DILATION;  Surgeon:  Daneil Dolin, MD;  [facility];  Service: Endoscopy;;  . ESOPHAGOGASTRODUODENOSCOPY N/A 10/07/2018   normal esophagus s/p dilation  . POLYPECTOMY  10/07/2018   Procedure: POLYPECTOMY;  Surgeon: Daneil Dolin, MD;  [facility];  Service: Endoscopy;;  Cecal polyps x 2 hot snare Ascending polyp x 1 Hot snare Rectal polyp x 1  . TEE WITHOUT CARDIOVERSION  07/06/2017  . TEE  WITHOUT CARDIOVERSION N/A 07/06/2017   Procedure: TRANSESOPHAGEAL ECHOCARDIOGRAM (TEE);  Surgeon: Josue Hector, MD;  Location: [facility] ENDOSCOPY;  Service: Cardiovascular;  Laterality: N/A;     Current Meds  Medication Sig  . acetaminophen (TYLENOL) 650 MG CR tablet Take 1,300 mg every 8 (eight) hours as needed by mouth for pain.  Marland Kitchen diltiazem (CARDIZEM CD) 240 MG 24 hr capsule TAKE 1 CAPSULE(240 MG) BY MOUTH DAILY (Patient taking differently: Take 240 mg by mouth daily. )  . diltiazem (CARDIZEM) 30 MG tablet Take 1 tablet (30 mg total) by mouth 4 (four) times daily as needed (for atrial fibrillation).  . DULoxetine (CYMBALTA) 60 MG capsule Take 60 mg by mouth daily.  Marland Kitchen levothyroxine (SYNTHROID, LEVOTHROID) 137 MCG tablet Take 137 mcg by mouth daily before breakfast.   . lisinopril (ZESTRIL) 20 MG tablet Take 1 tablet (20 mg total) by mouth daily.  . metoprolol succinate (TOPROL-XL) 100 MG 24 hr tablet Take 100 mg by mouth daily.  . pantoprazole (PROTONIX) 40 MG tablet Take 1 tablet (40 mg total) by mouth daily. 30 minutes before breakfast  . potassium chloride SA (K-DUR,KLOR-CON) 20 MEQ tablet Take 2 tablets (40 mEq total) by mouth daily.  . rivaroxaban (XARELTO) 20 MG TABS tablet TAKE 1 TABLET BY MOUTH EVERY DAY WITH DINNER  . torsemide (DEMADEX) 20 MG tablet TAKE 2 TABLETS BY MOUTH EVERY MORNING (Patient taking differently: Take 40 mg by mouth daily. )  . [DISCONTINUED] lisinopril (PRINIVIL,ZESTRIL) 20 MG tablet Take 1 tablet (20 mg total) by mouth daily.     Allergies:   Levaquin [levofloxacin] and Sulfa antibiotics   ROS:   No syncope.  Prior CV studies:   The following studies were reviewed today:  Echocardiogram 04/21/2018: 1. The left ventricle has normal systolic function with an ejection  fraction of 60-65%. The cavity size was normal. There is mildly increased  left ventricular wall thickness. Left ventricular diastolic Doppler  parameters are consistent with    pseudonormalization Elevated mean left atrial pressure.  2. The right ventricle has normal systolic function. The cavity was  mildly enlarged. There is no increase in right ventricular wall thickness.  3. Left atrial size was mildly dilated.  4. The mitral valve is normal in structure. There is mild thickening. No  evidence of mitral valve stenosis.  5. The tricuspid valve is normal in structure.  6. The aortic valve is tricuspid There is mild thickening of the aortic  valve.  7. The aortic root is normal in size and structure.  8. Right atrial pressure is estimated at 3 mmHg.   Labs/Other Tests and Data Reviewed:    EKG:  An ECG dated 05/03/2018 was personally reviewed today and demonstrated:  Normal sinus rhythm with poor R wave progression.  Recent Labs: 12/22/2018: BUN 15; Creat 0.94    Wt Readings from Last 3 Encounters:  05/09/19 162 lb (73.5 kg)  12/13/18 166 lb (75.3 kg)  10/07/18 165 lb (74.8 kg)     Objective:    Vital Signs:  BP (!) 142/64  Pulse 75   Ht 5\' 1"  (1.549 m)   Wt 162 lb (73.5 kg)   BMI 30.61 kg/m    Patient spoke in full sentences, not short of breath. No audible wheezing or coughing. Speech pattern normal.  ASSESSMENT & PLAN:    1.  Persistent atrial fibrillation with CHA2DS2-VASc score of 6.  She underwent atrial fibrillation ablation in 2019 and continues to follow with Dr. Rayann Heman.  No change in present standing doses of Toprol-XL or Cardizem CD.  She has short acting Cardizem 30 mg tablets to take as needed for palpitations.  Follow-up CBC and BMET on Xarelto.  2.  History of chronic diastolic heart failure.  She remains on Demadex and reports no progressive swelling, orthopnea or PND.   Time:   Today, I have spent 8 minutes with the patient with telehealth technology discussing the above problems.     Medication Adjustments/Labs and Tests Ordered: Current medicines are reviewed at length with the patient today.  Concerns  regarding medicines are outlined above.   Tests Ordered: Orders Placed This Encounter  Procedures  . CBC  . Basic Metabolic Panel (BMET)    Medication Changes: Meds ordered this encounter  Medications  . lisinopril (ZESTRIL) 20 MG tablet    Sig: Take 1 tablet (20 mg total) by mouth daily.    Dispense:  90 tablet    Refill:  3    Follow Up:  In Person 6 months in the [place] office.  Signed, Rozann Lesches, MD  05/09/2019 12:10 PM    [place]

## 2019-05-09 ENCOUNTER — Encounter: Payer: Self-pay | Admitting: Cardiology

## 2019-05-09 ENCOUNTER — Telehealth (INDEPENDENT_AMBULATORY_CARE_PROVIDER_SITE_OTHER): Payer: Medicare HMO | Admitting: Cardiology

## 2019-05-09 VITALS — BP 142/64 | HR 75 | Ht 61.0 in | Wt 162.0 lb

## 2019-05-09 DIAGNOSIS — I4819 Other persistent atrial fibrillation: Secondary | ICD-10-CM | POA: Diagnosis not present

## 2019-05-09 DIAGNOSIS — I5032 Chronic diastolic (congestive) heart failure: Secondary | ICD-10-CM | POA: Diagnosis not present

## 2019-05-09 DIAGNOSIS — I10 Essential (primary) hypertension: Secondary | ICD-10-CM

## 2019-05-09 MED ORDER — LISINOPRIL 20 MG PO TABS: 20.0000 mg | ORAL_TABLET | Freq: Every day | ORAL | 3 refills | Status: DC

## 2019-05-09 NOTE — Patient Instructions (Signed)
Medication Instructions:  Your physician recommends that you continue on your current medications as directed. Please refer to the Current Medication list given to you today.    I refilled your Lisinopril   *If you need a refill on your cardiac medications before your next appointment, please call your pharmacy*   Lab Work: CBC and BMEt , due now  If you have labs (blood work) drawn today and your tests are completely normal, you will receive your results only by: Marland Kitchen MyChart Message (if you have MyChart) OR . A paper copy in the mail If you have any lab test that is abnormal or we need to change your treatment, we will call you to review the results.   Testing/Procedures: None today   Follow-Up: At [facility], you and your health needs are our priority.  As part of our continuing mission to provide you with exceptional heart care, we have created designated Provider Care Teams.  These Care Teams include your primary Cardiologist (physician) and Advanced Practice Providers (APPs -  Physician Assistants and Nurse Practitioners) who all work together to provide you with the care you need, when you need it.  We recommend signing up for the patient portal called "MyChart".  Sign up information is provided on this After Visit Summary.  MyChart is used to connect with patients for Virtual Visits (Telemedicine).  Patients are able to view lab/test results, encounter notes, upcoming appointments, etc.  Non-urgent messages can be sent to your provider as well.   To learn more about what you can do with MyChart, go to [email or web address].    Your next appointment:   6 month(s)  The format for your next appointment:   In Person  Provider:   Rozann Lesches, MD   Other Instructions None       Thank you for choosing [facility] !

## 2019-05-17 ENCOUNTER — Other Ambulatory Visit: Payer: Self-pay | Admitting: Cardiology

## 2019-05-17 ENCOUNTER — Telehealth: Payer: Self-pay | Admitting: Cardiology

## 2019-05-17 NOTE — Telephone Encounter (Signed)
Pt doesn't have a primary care provider at this time and would like to know if Dr. Domenic Polite would give her a Rx for her DULoxetine (CYMBALTA) 60 MG capsule [id number]

## 2019-05-17 NOTE — Telephone Encounter (Signed)
This is generally not a medication that we prescribe, not related to her current cardiac status.

## 2019-05-17 NOTE — Telephone Encounter (Signed)
Patient informed and verbalized understanding

## 2019-06-23 ENCOUNTER — Encounter (HOSPITAL_COMMUNITY): Payer: Self-pay | Admitting: *Deleted

## 2019-06-23 ENCOUNTER — Other Ambulatory Visit: Payer: Self-pay

## 2019-06-23 ENCOUNTER — Emergency Department (HOSPITAL_COMMUNITY): Payer: Medicare HMO

## 2019-06-23 ENCOUNTER — Observation Stay (HOSPITAL_COMMUNITY)
Admission: EM | Admit: 2019-06-23 | Discharge: 2019-06-24 | Disposition: A | Discharge status: Home / Self Care | Payer: Medicare HMO | Attending: Internal Medicine | Admitting: Internal Medicine

## 2019-06-23 DIAGNOSIS — Z8673 Personal history of transient ischemic attack (TIA), and cerebral infarction without residual deficits: Secondary | ICD-10-CM | POA: Insufficient documentation

## 2019-06-23 DIAGNOSIS — Z882 Allergy status to sulfonamides status: Secondary | ICD-10-CM | POA: Diagnosis not present

## 2019-06-23 DIAGNOSIS — R0989 Other specified symptoms and signs involving the circulatory and respiratory systems: Secondary | ICD-10-CM | POA: Insufficient documentation

## 2019-06-23 DIAGNOSIS — I110 Hypertensive heart disease with heart failure: Secondary | ICD-10-CM | POA: Insufficient documentation

## 2019-06-23 DIAGNOSIS — F329 Major depressive disorder, single episode, unspecified: Secondary | ICD-10-CM | POA: Diagnosis not present

## 2019-06-23 DIAGNOSIS — Z86010 Personal history of colonic polyps: Secondary | ICD-10-CM | POA: Diagnosis not present

## 2019-06-23 DIAGNOSIS — F419 Anxiety disorder, unspecified: Secondary | ICD-10-CM | POA: Insufficient documentation

## 2019-06-23 DIAGNOSIS — M545 Low back pain, unspecified: Secondary | ICD-10-CM

## 2019-06-23 DIAGNOSIS — Z87891 Personal history of nicotine dependence: Secondary | ICD-10-CM | POA: Insufficient documentation

## 2019-06-23 DIAGNOSIS — I5032 Chronic diastolic (congestive) heart failure: Secondary | ICD-10-CM | POA: Diagnosis not present

## 2019-06-23 DIAGNOSIS — Z79899 Other long term (current) drug therapy: Secondary | ICD-10-CM | POA: Insufficient documentation

## 2019-06-23 DIAGNOSIS — Z881 Allergy status to other antibiotic agents status: Secondary | ICD-10-CM | POA: Insufficient documentation

## 2019-06-23 DIAGNOSIS — E0590 Thyrotoxicosis, unspecified without thyrotoxic crisis or storm: Secondary | ICD-10-CM | POA: Diagnosis present

## 2019-06-23 DIAGNOSIS — K219 Gastro-esophageal reflux disease without esophagitis: Secondary | ICD-10-CM | POA: Insufficient documentation

## 2019-06-23 DIAGNOSIS — I700 Atherosclerosis of aorta: Secondary | ICD-10-CM | POA: Diagnosis not present

## 2019-06-23 DIAGNOSIS — Z79890 Hormone replacement therapy (postmenopausal): Secondary | ICD-10-CM | POA: Insufficient documentation

## 2019-06-23 DIAGNOSIS — E039 Hypothyroidism, unspecified: Secondary | ICD-10-CM | POA: Diagnosis present

## 2019-06-23 DIAGNOSIS — E785 Hyperlipidemia, unspecified: Secondary | ICD-10-CM | POA: Diagnosis not present

## 2019-06-23 DIAGNOSIS — Z8249 Family history of ischemic heart disease and other diseases of the circulatory system: Secondary | ICD-10-CM | POA: Diagnosis not present

## 2019-06-23 DIAGNOSIS — Z7901 Long term (current) use of anticoagulants: Secondary | ICD-10-CM | POA: Insufficient documentation

## 2019-06-23 DIAGNOSIS — I2510 Atherosclerotic heart disease of native coronary artery without angina pectoris: Secondary | ICD-10-CM | POA: Diagnosis not present

## 2019-06-23 DIAGNOSIS — I10 Essential (primary) hypertension: Secondary | ICD-10-CM | POA: Diagnosis present

## 2019-06-23 DIAGNOSIS — I4891 Unspecified atrial fibrillation: Principal | ICD-10-CM

## 2019-06-23 DIAGNOSIS — M546 Pain in thoracic spine: Secondary | ICD-10-CM

## 2019-06-23 DIAGNOSIS — Z20822 Contact with and (suspected) exposure to covid-19: Secondary | ICD-10-CM | POA: Insufficient documentation

## 2019-06-23 LAB — RESPIRATORY PANEL BY RT PCR (FLU A&B, COVID)
Influenza A by PCR: NEGATIVE
Influenza B by PCR: NEGATIVE
SARS Coronavirus 2 by RT PCR: NEGATIVE

## 2019-06-23 LAB — COMPREHENSIVE METABOLIC PANEL
ALT: 22 U/L (ref 0–44)
AST: 29 U/L (ref 15–41)
Albumin: 4.6 g/dL (ref 3.5–5.0)
Alkaline Phosphatase: 143 U/L — ABNORMAL HIGH (ref 38–126)
Anion gap: 10 (ref 5–15)
BUN: 13 mg/dL (ref 8–23)
CO2: 26 mmol/L (ref 22–32)
Calcium: 10.4 mg/dL — ABNORMAL HIGH (ref 8.9–10.3)
Chloride: 103 mmol/L (ref 98–111)
Creatinine, Ser: 0.89 mg/dL (ref 0.44–1.00)
GFR calc Af Amer: >60 mL/min (ref >60)
GFR calc non Af Amer: >60 mL/min (ref >60)
Glucose, Bld: 99 mg/dL (ref 70–99)
Potassium: 4 mmol/L (ref 3.5–5.1)
Sodium: 139 mmol/L (ref 135–145)
Total Bilirubin: 0.8 mg/dL (ref 0.3–1.2)
Total Protein: 8.2 g/dL — ABNORMAL HIGH (ref 6.5–8.1)

## 2019-06-23 LAB — CBC WITH DIFFERENTIAL/PLATELET
Abs Immature Granulocytes: 0.01 10*3/uL (ref 0.00–0.07)
Basophils Absolute: 0 10*3/uL (ref 0.0–0.1)
Basophils Relative: 1 %
Eosinophils Absolute: 0.1 10*3/uL (ref 0.0–0.5)
Eosinophils Relative: 1 %
HCT: 40 % (ref 36.0–46.0)
Hemoglobin: 12.7 g/dL (ref 12.0–15.0)
Immature Granulocytes: 0 %
Lymphocytes Relative: 31 %
Lymphs Abs: 1.6 10*3/uL (ref 0.7–4.0)
MCH: 31.1 pg (ref 26.0–34.0)
MCHC: 31.8 g/dL (ref 30.0–36.0)
MCV: 97.8 fL (ref 80.0–100.0)
Monocytes Absolute: 0.4 10*3/uL (ref 0.1–1.0)
Monocytes Relative: 7 %
Neutro Abs: 3.1 10*3/uL (ref 1.7–7.7)
Neutrophils Relative %: 60 %
Platelets: 270 10*3/uL (ref 150–400)
RBC: 4.09 MIL/uL (ref 3.87–5.11)
RDW: 13.9 % (ref 11.5–15.5)
WBC: 5.1 10*3/uL (ref 4.0–10.5)
nRBC: 0 % (ref 0.0–0.2)

## 2019-06-23 LAB — MAGNESIUM: Magnesium: 2.3 mg/dL (ref 1.7–2.4)

## 2019-06-23 LAB — BRAIN NATRIURETIC PEPTIDE: B Natriuretic Peptide: 337 pg/mL — ABNORMAL HIGH (ref 0.0–100.0)

## 2019-06-23 LAB — TROPONIN I (HIGH SENSITIVITY)
Troponin I (High Sensitivity): 3 ng/L (ref <18)
Troponin I (High Sensitivity): 3 ng/L (ref <18)

## 2019-06-23 LAB — TSH: TSH: 0.52 u[IU]/mL (ref 0.350–4.500)

## 2019-06-23 MED ORDER — METHOCARBAMOL 500 MG PO TABS
500.0000 mg | ORAL_TABLET | Freq: Four times a day (QID) | ORAL | Status: DC | PRN
  Administered 2019-06-23 – 2019-06-24 (×2): 500 mg via ORAL
  Filled 2019-06-23 (×2): qty 1

## 2019-06-23 MED ORDER — FENTANYL CITRATE (PF) 100 MCG/2ML IJ SOLN
50.0000 ug | Freq: Once | INTRAMUSCULAR | Status: AC
  Administered 2019-06-23: 50 ug via INTRAVENOUS
  Filled 2019-06-23: qty 2

## 2019-06-23 MED ORDER — CHLORHEXIDINE GLUCONATE CLOTH 2 % EX PADS
6.0000 | MEDICATED_PAD | Freq: Every day | CUTANEOUS | Status: DC
  Administered 2019-06-23: 6 via TOPICAL

## 2019-06-23 MED ORDER — DILTIAZEM HCL-DEXTROSE 125-5 MG/125ML-% IV SOLN (PREMIX)
5.0000 mg/h | INTRAVENOUS | Status: DC
  Administered 2019-06-23: 16:00:00 5 mg/h via INTRAVENOUS
  Administered 2019-06-23: 16:00:00 10 mg/h via INTRAVENOUS
  Filled 2019-06-23: qty 125

## 2019-06-23 MED ORDER — IOHEXOL 350 MG/ML SOLN
100.0000 mL | Freq: Once | INTRAVENOUS | Status: AC | PRN
  Administered 2019-06-23: 100 mL via INTRAVENOUS

## 2019-06-23 MED ORDER — METOPROLOL SUCCINATE ER 50 MG PO TB24
100.0000 mg | ORAL_TABLET | Freq: Every day | ORAL | Status: DC
  Administered 2019-06-23: 100 mg via ORAL
  Filled 2019-06-23: qty 4

## 2019-06-23 MED ORDER — ACETAMINOPHEN 650 MG RE SUPP: 650.0000 mg | Freq: Four times a day (QID) | RECTAL | Status: DC | PRN

## 2019-06-23 MED ORDER — ACETAMINOPHEN 325 MG PO TABS
650.0000 mg | ORAL_TABLET | Freq: Four times a day (QID) | ORAL | Status: DC | PRN
  Administered 2019-06-23 – 2019-06-24 (×2): 650 mg via ORAL
  Filled 2019-06-23 (×2): qty 2

## 2019-06-23 MED ORDER — LEVOTHYROXINE SODIUM 25 MCG PO TABS
137.0000 ug | ORAL_TABLET | Freq: Every day | ORAL | Status: DC
  Administered 2019-06-24: 07:00:00 137 ug via ORAL
  Filled 2019-06-23: qty 1

## 2019-06-23 MED ORDER — DILTIAZEM HCL 60 MG PO TABS
60.0000 mg | ORAL_TABLET | Freq: Four times a day (QID) | ORAL | Status: DC
  Administered 2019-06-23 – 2019-06-24 (×3): 60 mg via ORAL
  Filled 2019-06-23 (×3): qty 1

## 2019-06-23 MED ORDER — RIVAROXABAN 20 MG PO TABS
20.0000 mg | ORAL_TABLET | Freq: Every day | ORAL | Status: DC
  Administered 2019-06-23: 20 mg via ORAL
  Filled 2019-06-23: qty 1

## 2019-06-23 MED ORDER — DILTIAZEM HCL 25 MG/5ML IV SOLN
10.0000 mg | Freq: Once | INTRAVENOUS | Status: AC
  Administered 2019-06-23: 12:00:00 10 mg via INTRAVENOUS
  Filled 2019-06-23: qty 5

## 2019-06-23 MED ORDER — TORSEMIDE 20 MG PO TABS
40.0000 mg | ORAL_TABLET | Freq: Every day | ORAL | Status: DC
  Administered 2019-06-23 – 2019-06-24 (×2): 40 mg via ORAL
  Filled 2019-06-23 (×2): qty 2

## 2019-06-23 MED ORDER — ACETAMINOPHEN 325 MG PO TABS: 650.0000 mg | ORAL_TABLET | Freq: Four times a day (QID) | ORAL | Status: DC | PRN

## 2019-06-23 MED ORDER — MORPHINE SULFATE (PF) 2 MG/ML IV SOLN
2.0000 mg | Freq: Once | INTRAVENOUS | Status: AC
  Administered 2019-06-23: 21:00:00 2 mg via INTRAVENOUS
  Filled 2019-06-23: qty 1

## 2019-06-23 MED ORDER — PANTOPRAZOLE SODIUM 40 MG PO TBEC
40.0000 mg | DELAYED_RELEASE_TABLET | Freq: Every day | ORAL | Status: DC
  Administered 2019-06-23 – 2019-06-24 (×2): 40 mg via ORAL
  Filled 2019-06-23 (×2): qty 1

## 2019-06-23 NOTE — ED Provider Notes (Signed)
[facility] EMERGENCY DEPARTMENT Provider Note   CSN: [id number] Arrival date & time: 06/23/19  1115     History Chief Complaint  Patient presents with  . Shortness of Breath  . Back Pain    Diane Cohen is a 74 y.o. female with a history of CHF with last EF 60-65%, atrial fibrillation on xarelto, hypertension, hyperlipidemia, hypothyroidism, and anemia who presents to the ED with complaints of shortness of breath for the past week and back pain that started last night.  Patient states that she feels intermittently short of breath, no specific alleviating or aggravating factors, this has been occurring for about 1 week, associated symptoms include some mild nasal congestion and cough that is intermittently productive of phlegm sputum over the past few weeks, she states this is fairly typical for her with allergies though.  States that last night she developed pain to the mid thoracic back region, it is fairly constant, alleviated with a hot shower, worse with certain positions/movements.  She cannot recall specific change in activity or injury, she does help her husband with pivoting transitions as he is wheelchair-bound.  Upon further questioning she does state that she has intermittent swelling to the left lower extremity which she attributes to arthritis.  Denies chest pain, nausea, vomiting, diaphoresis, lightheadedness, dizziness, syncope, numbness, tingling, weakness, incontinence, retention, or dysuria. Taking medication as prescribed, but thinks she may have missed a doze of xarelto.   HPI     Past Medical History:  Diagnosis Date  . Anxiety   . Atrial fibrillation ([place])   . Chronic diastolic heart failure ([place])   . Depression   . Essential hypertension   . Hyperlipidemia   . Hypothyroidism   . Pneumonia   . TIA (transient ischemic attack)    12-13 years ago    Patient Active Problem List   Diagnosis Date Noted  . History of colonic polyps 12/13/2018  . Constipation  12/13/2018  . Abdominal pain, epigastric 12/13/2018  . History of hiatal hernia 05/12/2018  . Encounter for screening colonoscopy 05/12/2018  . Acute on chronic diastolic CHF (congestive heart failure) ([place]) 04/20/2018  . Swelling of limb 11/09/2017  . Chronic diastolic heart failure ([place]) 12/29/2013  . Anemia 09/20/2013  . Bilateral leg edema 08/29/2013  . Essential hypertension, benign   . Hypothyroidism   . Persistent atrial fibrillation     Past Surgical History:  Procedure Laterality Date  . ABDOMINAL HYSTERECTOMY    . ABLATION OF DYSRHYTHMIC FOCUS  07/06/2017  . ATRIAL FIBRILLATION ABLATION N/A 07/06/2017   Procedure: ATRIAL FIBRILLATION ABLATION;  Surgeon: Thompson Grayer, MD;  Location: [facility];  Service: Cardiovascular;  Laterality: N/A;  . BREAST SURGERY     biopsy  . CARDIOVERSION N/A 01/23/2016   Procedure: CARDIOVERSION;  Surgeon: Satira Sark, MD;  Location: [facility];  Service: Cardiovascular;  Laterality: N/A;  . CARDIOVERSION N/A 11/13/2016   Procedure: CARDIOVERSION;  Surgeon: Arnoldo Lenis, MD;  Location: [facility];  Service: Endoscopy;  Laterality: N/A;  . CARDIOVERSION N/A 04/20/2018   Procedure: CARDIOVERSION;  Surgeon: [name], MD;  Location: [facility] ENDOSCOPY;  Service: Cardiovascular;  Laterality: N/A;  . CHOLECYSTECTOMY    . COLONOSCOPY N/A 10/07/2018   Sigmoid and descending colon diverticulosis, three 6 to 15 mm polyps in rectum, ascending colon, and IC valve, one 6 mm polyp in cecum. (sessile serrated polyps and sessile serrated adenoma of ascending colon with focal high grade dysplasia.  . coloscopy    .  ESOPHAGEAL DILATION  10/07/2018   Procedure: ESOPHAGEAL DILATION;  Surgeon: Daneil Dolin, MD;  Location: [facility];  Service: Endoscopy;;  . ESOPHAGOGASTRODUODENOSCOPY N/A 10/07/2018   normal esophagus s/p dilation  . POLYPECTOMY  10/07/2018   Procedure: POLYPECTOMY;  Surgeon: Daneil Dolin, MD;  Location: [facility];  Service: Endoscopy;;  Cecal polyps x 2 hot snare Ascending polyp x 1 Hot snare Rectal polyp x 1  . TEE WITHOUT CARDIOVERSION  07/06/2017  . TEE WITHOUT CARDIOVERSION N/A 07/06/2017   Procedure: TRANSESOPHAGEAL ECHOCARDIOGRAM (TEE);  Surgeon: Josue Hector, MD;  Location: [facility] ENDOSCOPY;  Service: Cardiovascular;  Laterality: N/A;     OB History   No obstetric history on file.     Family History  Problem Relation Age of Onset  . Hypertension Other   . Hypertension Father   . Hypertension Mother   . Anemia Sister   . Hypertension Sister   . Colon cancer Neg Hx   . Colon polyps Neg Hx     Social History   Tobacco Use  . Smoking status: Former Smoker    Packs/day: 0.50    Types: Cigarettes    Quit date: 01/22/1973    Years since quitting: 46.4  . Smokeless tobacco: Never Used  Substance Use Topics  . Alcohol use: Yes    Alcohol/week: 0.0 standard drinks    Comment: 3 times a year: Christmas, Thanksgiving, and maybe just because   . Drug use: No    Home Medications Prior to Admission medications   Medication Sig Start Date End Date Taking? Authorizing Provider  acetaminophen (TYLENOL) 650 MG CR tablet Take 1,300 mg every 8 (eight) hours as needed by mouth for pain.    [provider]  diltiazem (CARDIZEM CD) 240 MG 24 hr capsule Take 1 capsule (240 mg total) by mouth daily. 05/18/19   Satira Sark, MD  diltiazem (CARDIZEM) 30 MG tablet Take 1 tablet (30 mg total) by mouth 4 (four) times daily as needed (for atrial fibrillation). 10/06/17   Allred, Jeneen Rinks, MD  DULoxetine (CYMBALTA) 60 MG capsule Take 60 mg by mouth daily.    [provider]  levothyroxine (SYNTHROID, LEVOTHROID) 137 MCG tablet Take 137 mcg by mouth daily before breakfast.  11/04/16   [provider]  lisinopril (ZESTRIL) 20 MG tablet Take 1 tablet (20 mg total) by mouth daily. 05/09/19   Satira Sark, MD  metoprolol succinate (TOPROL-XL) 100 MG 24 hr tablet Take 100  mg by mouth daily. 04/06/18   [provider]  pantoprazole (PROTONIX) 40 MG tablet Take 1 tablet (40 mg total) by mouth daily. 30 minutes before breakfast 12/13/18   Annitta Needs, NP  potassium chloride SA (K-DUR,KLOR-CON) 20 MEQ tablet Take 2 tablets (40 mEq total) by mouth daily. 08/13/15   Lendon Colonel, NP  rivaroxaban (XARELTO) 20 MG TABS tablet TAKE 1 TABLET BY MOUTH EVERY DAY WITH DINNER 03/08/19   Satira Sark, MD  torsemide (DEMADEX) 20 MG tablet TAKE 2 TABLETS BY MOUTH EVERY MORNING Patient taking differently: Take 40 mg by mouth daily.  10/03/18   Satira Sark, MD    Allergies    Levaquin [levofloxacin] and Sulfa antibiotics  Review of Systems   Review of Systems  Constitutional: Negative for chills and fever.  HENT: Positive for congestion. Negative for ear pain and sore throat.   Respiratory: Positive for cough and shortness of breath.   Cardiovascular: Negative for chest pain.  Gastrointestinal: Negative for abdominal pain, diarrhea, nausea and vomiting.  Genitourinary: Negative for dysuria.  Musculoskeletal: Positive for back pain.  Neurological: Negative for weakness and numbness.       Negative for incontinence or saddle anesthesia.  All other systems reviewed and are negative.   Physical Exam Updated Vital Signs BP (!) 157/78   Pulse (!) 56   Temp 97.7 F (36.5 C)   Resp 20   Ht 5' (1.524 m)   Wt 79.8 kg   SpO2 99%   BMI 34.37 kg/m   Physical Exam Vitals and nursing note reviewed.  Constitutional:      General: She is not in acute distress.    Appearance: She is well-developed. She is not toxic-appearing.  HENT:     Head: Normocephalic and atraumatic.  Eyes:     General:        Right eye: No discharge.        Left eye: No discharge.     Conjunctiva/sclera: Conjunctivae normal.  Cardiovascular:     Comments: Tachycardic, irregularly irregular.  2+ symmetric radial pulses.  2+ symmetric DP pulses. Pulmonary:     Effort:  Pulmonary effort is normal. No respiratory distress.     Breath sounds: Normal breath sounds. No wheezing, rhonchi or rales.  Abdominal:     General: There is no distension.     Palpations: Abdomen is soft.     Tenderness: There is no abdominal tenderness.  Musculoskeletal:     Cervical back: Neck supple.     Comments: Back: Patient is tender to palpation to the mid thoracic region including midline and bilateral paraspinal muscles. Patient has asymmetric pitting edema to the bilateral lower leg/ankle left greater than right.  No erythema/warmth.  Varicose veins noted.  Intact active range of motion.  Nontender to palpation.  No calf tenderness.  Skin:    General: Skin is warm and dry.     Findings: No rash.  Neurological:     Mental Status: She is alert.     Comments: Clear speech.  Sensation grossly intact bilateral upper and lower extremities.  5 out of 5 symmetric grip strength.  5 out of 5 strength with plantar dorsiflexion bilaterally.  Psychiatric:        Behavior: Behavior normal.     ED Results / Procedures / Treatments   Labs (all labs ordered are listed, but only abnormal results are displayed) Labs Reviewed  COMPREHENSIVE METABOLIC PANEL - Abnormal; Notable for the following components:      Result Value   Calcium 10.4 (*)    Total Protein 8.2 (*)    Alkaline Phosphatase 143 (*)    All other components within normal limits  BRAIN NATRIURETIC PEPTIDE - Abnormal; Notable for the following components:   B Natriuretic Peptide 337.0 (*)    All other components within normal limits  CBC WITH DIFFERENTIAL/PLATELET  TROPONIN I (HIGH SENSITIVITY)    EKG None  Radiology DG Chest 2 View  Result Date: 06/23/2019 CLINICAL DATA:  Dyspnea. EXAM: CHEST - 2 VIEW COMPARISON:  April 20, 2018. FINDINGS: Stable cardiomediastinal silhouette with mild central pulmonary vascular congestion. No pneumothorax or pleural effusion is noted. No consolidative process is noted. Thoracic  kyphosis is noted. Atherosclerosis of thoracic aorta is noted IMPRESSION: Mild central pulmonary vascular congestion is noted. Aortic Atherosclerosis (ICD10-I70.0). Electronically Signed   By: Marijo Conception M.D.   On: 06/23/2019 14:25   CT Angio Chest PE W/Cm &/Or Wo Cm  Result  Date: 06/23/2019 CLINICAL DATA:  Shortness of breath with lower extremity edema. Chest tightness. EXAM: CT ANGIOGRAPHY CHEST WITH CONTRAST TECHNIQUE: Multidetector CT imaging of the chest was performed using the standard protocol during bolus administration of intravenous contrast. Multiplanar CT image reconstructions and MIPs were obtained to evaluate the vascular anatomy. CONTRAST:  170mL OMNIPAQUE IOHEXOL 350 MG/ML SOLN COMPARISON:  CT angiogram chest March 31, 2013; chest radiograph June 23, 2019 FINDINGS: Cardiovascular: There is no demonstrable pulmonary embolus. There is no thoracic aortic aneurysm. No thoracic aortic dissection. The contrast bolus in the aorta is somewhat less than optimal for dissection assessment. Visualized great vessels appear normal. There are foci of aortic atherosclerosis. There are foci of coronary artery calcification. There is no pericardial effusion or pericardial thickening. Mediastinum/Nodes: Thyroid appears unremarkable. There is a focal lymph node in the right anterior precarinal region measuring 1.4 x 1.2 cm, enlarged but smaller than on previous study. No other lymph node enlargement seen currently. There are several subcentimeter mediastinal lymph nodes which do not meet size criteria for pathologic significance. No esophageal lesions are demonstrable. Lungs/Pleura: There is underlying centrilobular emphysematous change. There are scattered areas of atelectatic change bilaterally without edema or consolidation. No pleural effusions are evident. There are areas of lobular septal thickening, primarily in the lower lobes. No appreciable bronchiectasis. Upper Abdomen: Gallbladder absent.  Apparent cyst in the posterior segment right lobe of the liver measuring 1.3 x 0.8 cm. There is a cyst in the left lobe of the liver anteriorly measuring 0.8 x 0.8 cm. Visualized upper abdominal structures otherwise appear unremarkable. Musculoskeletal: There is degenerative change in the thoracic spine. No blastic or lytic bone lesions. No chest wall lesions evident. Review of the MIP images confirms the above findings. IMPRESSION: 1. No demonstrable pulmonary embolus. No thoracic aortic aneurysm or dissection. There is aortic atherosclerosis. There are foci of coronary artery calcification. 2. Underlying centrilobular emphysematous change. Areas of mild atelectasis. No edema or airspace opacity. Lobular septal thickening in the bases likely represents chronic inflammation. No overt pulmonary edema. No pleural effusions. 3. Prominent precarinal lymph node, smaller than on previous study. No new adenopathy. 4.  Absent gallbladder. Aortic Atherosclerosis (ICD10-I70.0) and Emphysema (ICD10-J43.9). Electronically Signed   By: Lowella Grip III M.D.   On: 06/23/2019 15:00   US Venous Img Lower  Left (DVT Study)  Result Date: 06/23/2019 CLINICAL DATA:  74 year old female with leg swelling. EXAM: LEFT LOWER EXTREMITY VENOUS DOPPLER ULTRASOUND TECHNIQUE: Gray-scale sonography with compression, as well as color and duplex ultrasound, were performed to evaluate the deep venous system(s) from the level of the common femoral vein through the popliteal and proximal calf veins. COMPARISON:  None. FINDINGS: VENOUS Normal compressibility of the common femoral, superficial femoral, and popliteal veins, as well as the visualized calf veins. Visualized portions of profunda femoral vein and great saphenous vein unremarkable. No filling defects to suggest DVT on grayscale or color Doppler imaging. Doppler waveforms show normal direction of venous flow, normal respiratory phasicity and response to augmentation. Limited views of  the contralateral common femoral vein are unremarkable. OTHER Subcutaneous edema evident at the calf (image 32). Limitations: none IMPRESSION: No evidence of left lower extremity deep venous thrombosis. Electronically Signed   By: Genevie Ann M.D.   On: 06/23/2019 14:00    Procedures .Critical Care Performed by: Amaryllis Dyke, PA-C Authorized by: Amaryllis Dyke, PA-C    CRITICAL CARE Performed by: Kennith Maes   Total critical care time: 35 minutes  Critical  care time was exclusive of separately billable procedures and treating other patients.  Critical care was necessary to treat or prevent imminent or life-threatening deterioration.  Critical care was time spent personally by me on the following activities: development of treatment plan with patient and/or surrogate as well as nursing, discussions with consultants, evaluation of patient's response to treatment, examination of patient, obtaining history from patient or surrogate, ordering and performing treatments and interventions, ordering and review of laboratory studies, ordering and review of radiographic studies, pulse oximetry and re-evaluation of patient's condition.    (including critical care time)  Medications Ordered in ED Medications  fentaNYL (SUBLIMAZE) injection 50 mcg (50 mcg Intravenous Given 06/23/19 1426)  diltiazem (CARDIZEM) injection 10 mg (10 mg Intravenous Given 06/23/19 1227)  iohexol (OMNIPAQUE) 350 MG/ML injection 100 mL (100 mLs Intravenous Contrast Given 06/23/19 1409)    ED Course  I have reviewed the triage vital signs and the nursing notes.  Pertinent labs & imaging results that were available during my care of the patient were reviewed by me and considered in my medical decision making (see chart for details).  Diane Cohen was evaluated in Emergency Department on 06/23/2019 for the symptoms described in the history of present illness. He/she was evaluated in the context of the  global COVID-19 pandemic, which necessitated consideration that the patient might be at risk for infection with the SARS-CoV-2 virus that causes COVID-19. Institutional protocols and algorithms that pertain to the evaluation of patients at risk for COVID-19 are in a state of rapid change based on information released by regulatory bodies including the CDC and federal and state organizations. These policies and algorithms were followed during the patient's care in the ED.    MDM Rules/Calculators/A&P                     This patient presents to the ED for concern of intermittent shortness of breath for the past week and back pain that began last night.  She is nontoxic, somewhat hypertensive, notably appears to be in A. fib with RVR based on tachycardia with irregularly irregular pulse, confirmed on EKG.  Some reproducibility of back pain with palpation to the posterior chest wall.  Differential diagnosis includes: A. fib with RVR, pulmonary embolism, ACS, pneumonia, pneumothorax, muscular strain, costochondritis, severe anemia.  No neuro deficits in regards to back pain therefore do not suspect cauda equina or cord compression.  Additional history obtained:  Additional history obtained from nursing notes & chart review.   Lab Tests:  I Ordered, reviewed, and interpreted labs, which included:  CBC: No anemia or leukocytosis. CMP: Mild hypercalcemia, no significant electrolyte derangement. Initial troponin: 3 BNP: Elevation at 337  Imaging Studies ordered:  I ordered imaging studies which included CXR, LLE venous duplex, and CTA, I independently visualized and interpreted imaging which showed:  LLE venous duplex: No evidence of DVT. Chest x-ray: Mild central pulmonary vascular congestion noted CT angio of the chest: No PE or thoracic aortic aneurysm/dissection.  Emphysema changes.  No edema or airspace opacity.  No overt pulmonary edema.  No pleural effusions.  Medicines ordered:  I ordered  medication Cardizem for atrial fibrillation and fentanyl for back pain.  Reevaluation: 15:10: RE-EVAL: After the interventions stated above, I reevaluated the patient and found her to have resolution of her pain, however she remains in A. fib with RVR with rate ranging 110-130 bpm. Will start cardizem drip and consult for admission.  Discussed results and plan of  care with patient who is in agreement.  Consultations Obtained: 15:20: CONSULT: Discussed case with hospitalist Dr. Waldron Labs- accepts admission.   This is a shared visit with supervising physician Dr. Sedonia Small who has independently evaluated patient & provided guidance in evaluation/management/disposition, in agreement with care   Critical Interventions:  . Diltiazem drip Portions of this note were generated with [employer]. Dictation errors may occur despite best attempts at proofreading.  Final Clinical Impression(s) / ED Diagnoses Final diagnoses:  Atrial fibrillation with rapid ventricular response ([place])  Acute bilateral thoracic back pain    Rx / DC Orders ED Discharge Orders    None       Amaryllis Dyke, PA-C 06/23/19 1520    Maudie Flakes, MD 06/24/19 1236

## 2019-06-23 NOTE — H&P (Signed)
TRH H&P   Patient Demographics:    Diane Cohen, is a 74 y.o. female  MRN: [id number]   DOB - 1945/07/01  Admit Date - 06/23/2019  Outpatient Primary MD for the patient is Patient, No Pcp Per  Referring MD/NP/PA: PA samantha  Outpatient Specialists: Dr Duard Brady  Patient coming from: Home  Chief Complaint  Patient presents with  . Shortness of Breath  . Back Pain      HPI:    Diane Cohen  is a 74 y.o. female,with past medical history of diastolic CHF, last known EF 60 to 65%, atrial fibrillation on Xarelto, hypertension, hyperlipidemia, hypothyroidism, patient presents to ED secondary to complaints of back pain started last week, as well as some intermittent dyspnea over the last week as well, she reports no provoking or activating factors for her dyspnea, reports some mild nasal congestion, which is typical for her allergies, reports her back pain is constant, resolved with hot showers, worsened with activity and certain positions, patient with known kyphoscoliosis, she denies any trauma or injury to her back, denies any chest pain, nausea, vomiting, diaphoresis, syncope. - in ED she was noted to have fib with RVR heart rate in the 140s, reports she did not take her Cardizem or metoprolol this morning, CTA chest negative for PE, as well venous Dopplers with no evidence of DVT, potassium is at 4, she was started on Cardizem drip, and I was requested to admit.    Review of systems:    In addition to the HPI above,  No Fever-chills, No Headache, No changes with Vision or hearing, No problems swallowing food or Liquids, No Chest pain, Cough, reports mild dyspnea and palpitation .no Abdominal pain, No Nausea or Vommitting, Bowel movements are regular, No Blood in stool or Urine, No dysuria, No new skin rashes or bruises, Complains of back pain. No new weakness, tingling, numbness  in any extremity, No recent weight gain or loss, No polyuria, polydypsia or polyphagia, No significant Mental Stressors.  A full 10 point Review of Systems was done, except as stated above, all other Review of Systems were negative.   With Past History of the following :    Past Medical History:  Diagnosis Date  . Anxiety   . Atrial fibrillation ([place])   . Chronic diastolic heart failure ([place])   . Depression   . Essential hypertension   . Hyperlipidemia   . Hypothyroidism   . Pneumonia   . TIA (transient ischemic attack)    12-13 years ago      Past Surgical History:  Procedure Laterality Date  . ABDOMINAL HYSTERECTOMY    . ABLATION OF DYSRHYTHMIC FOCUS  07/06/2017  . ATRIAL FIBRILLATION ABLATION N/A 07/06/2017   Procedure: ATRIAL FIBRILLATION ABLATION;  Surgeon: Thompson Grayer, MD;  Location: [facility];  Service: Cardiovascular;  Laterality: N/A;  . BREAST SURGERY  biopsy  . CARDIOVERSION N/A 01/23/2016   Procedure: CARDIOVERSION;  Surgeon: Satira Sark, MD;  [facility];  Service: Cardiovascular;  Laterality: N/A;  . CARDIOVERSION N/A 11/13/2016   Procedure: CARDIOVERSION;  Surgeon: Arnoldo Lenis, MD;  [facility];  Service: Endoscopy;  Laterality: N/A;  . CARDIOVERSION N/A 04/20/2018   Procedure: CARDIOVERSION;  Surgeon: [name], MD;  Location: [facility];  Service: Cardiovascular;  Laterality: N/A;  . CHOLECYSTECTOMY    . COLONOSCOPY N/A 10/07/2018   Sigmoid and descending colon diverticulosis, three 6 to 15 mm polyps in rectum, ascending colon, and IC valve, one 6 mm polyp in cecum. (sessile serrated polyps and sessile serrated adenoma of ascending colon with focal high grade dysplasia.  . coloscopy    . ESOPHAGEAL DILATION  10/07/2018   Procedure: ESOPHAGEAL DILATION;  Surgeon: Daneil Dolin, MD;  [facility];  Service: Endoscopy;;  . ESOPHAGOGASTRODUODENOSCOPY N/A 10/07/2018   normal esophagus s/p  dilation  . POLYPECTOMY  10/07/2018   Procedure: POLYPECTOMY;  Surgeon: Daneil Dolin, MD;  [facility];  Service: Endoscopy;;  Cecal polyps x 2 hot snare Ascending polyp x 1 Hot snare Rectal polyp x 1  . TEE WITHOUT CARDIOVERSION  07/06/2017  . TEE WITHOUT CARDIOVERSION N/A 07/06/2017   Procedure: TRANSESOPHAGEAL ECHOCARDIOGRAM (TEE);  Surgeon: Josue Hector, MD;  Location: [facility];  Service: Cardiovascular;  Laterality: N/A;      Social History:     Social History   Tobacco Use  . Smoking status: Former Smoker    Packs/day: 0.50    Types: Cigarettes    Quit date: 01/22/1973    Years since quitting: 46.4  . Smokeless tobacco: Never Used  Substance Use Topics  . Alcohol use: Yes    Alcohol/week: 0.0 standard drinks    Comment: 3 times a year: Christmas, Thanksgiving, and maybe just because         Family History :     Family History  Problem Relation Age of Onset  . Hypertension Other   . Hypertension Father   . Hypertension Mother   . Anemia Sister   . Hypertension Sister   . Colon cancer Neg Hx   . Colon polyps Neg Hx      Home Medications:   Prior to Admission medications   Medication Sig Start Date End Date Taking? Authorizing Provider  acetaminophen (TYLENOL) 650 MG CR tablet Take 1,300 mg every 8 (eight) hours as needed by mouth for pain.    [provider]  diltiazem (CARDIZEM CD) 240 MG 24 hr capsule Take 1 capsule (240 mg total) by mouth daily. 05/18/19   Satira Sark, MD  diltiazem (CARDIZEM) 30 MG tablet Take 1 tablet (30 mg total) by mouth 4 (four) times daily as needed (for atrial fibrillation). 10/06/17   Allred, Jeneen Rinks, MD  DULoxetine (CYMBALTA) 60 MG capsule Take 60 mg by mouth daily.    [provider]  levothyroxine (SYNTHROID, LEVOTHROID) 137 MCG tablet Take 137 mcg by mouth daily before breakfast.  11/04/16   [provider]  lisinopril (ZESTRIL) 20 MG tablet Take 1 tablet (20 mg total) by mouth  daily. 05/09/19   Satira Sark, MD  metoprolol succinate (TOPROL-XL) 100 MG 24 hr tablet Take 100 mg by mouth daily. 04/06/18   [provider]  pantoprazole (PROTONIX) 40 MG tablet Take 1 tablet (40 mg total) by mouth daily. 30 minutes before breakfast 12/13/18  Annitta Needs, NP  potassium chloride SA (K-DUR,KLOR-CON) 20 MEQ tablet Take 2 tablets (40 mEq total) by mouth daily. 08/13/15   Lendon Colonel, NP  rivaroxaban (XARELTO) 20 MG TABS tablet TAKE 1 TABLET BY MOUTH EVERY DAY WITH DINNER 03/08/19   Satira Sark, MD  torsemide (DEMADEX) 20 MG tablet TAKE 2 TABLETS BY MOUTH EVERY MORNING Patient taking differently: Take 40 mg by mouth daily.  10/03/18   Satira Sark, MD     Allergies:     Allergies  Allergen Reactions  . Levaquin [Levofloxacin] Other (See Comments)    Headache   . Sulfa Antibiotics Hives     Physical Exam:   Vitals  Blood pressure (!) 124/103, pulse (!) 105, temperature 97.7 F (36.5 C), resp. rate (!) 23, height 5' (1.524 m), weight 79.8 kg, SpO2 98 %.   1. General well developed female lying in bed in NAD,    2. Normal affect and insight, Not Suicidal or Homicidal, Awake Alert, Oriented X 3.  3. No F.N deficits, ALL C.Nerves Intact, Strength 5/5 all 4 extremities, Sensation intact all 4 extremities, Plantars down going.  4. Ears and Eyes appear Normal, Conjunctivae clear, PERRLA. Moist Oral Mucosa.  5. Supple Neck, No JVD, No cervical lymphadenopathy appriciated, No Carotid Bruits.  6. Symmetrical Chest wall movement, Good air movement bilaterally, CTAB.  7. Irregular irregular, tachycardic, No Gallops, Rubs or Murmurs, No Parasternal Heave. No lower extremity edema, left more than right  8. Positive Bowel Sounds, Abdomen Soft, No tenderness, No organomegaly appriciated,No rebound -guarding or rigidity.  9.  No Cyanosis, Normal Skin Turgor, No Skin Rash or Bruise.  10. Good muscle tone,  joints appear normal , no effusions,  Normal ROM. She has kyphoscoliosis, tenderness to palpation in the kyphoscoliotic area in the lower thoracic spine.  11. No Palpable Lymph Nodes in Neck or Axillae    Data Review:    CBC Recent Labs  Lab 06/23/19 1209  WBC 5.1  HGB 12.7  HCT 40.0  PLT 270  MCV 97.8  MCH 31.1  MCHC 31.8  RDW 13.9  LYMPHSABS 1.6  MONOABS 0.4  EOSABS 0.1  BASOSABS 0.0   ------------------------------------------------------------------------------------------------------------------  Chemistries  Recent Labs  Lab 06/23/19 1209  NA 139  K 4.0  CL 103  CO2 26  GLUCOSE 99  BUN 13  CREATININE 0.89  CALCIUM 10.4*  AST 29  ALT 22  ALKPHOS 143*  BILITOT 0.8   ------------------------------------------------------------------------------------------------------------------ estimated creatinine clearance is 52.6 mL/min (by C-G formula based on SCr of 0.89 mg/dL). ------------------------------------------------------------------------------------------------------------------ No results for input(s): TSH, T4TOTAL, T3FREE, THYROIDAB in the last 72 hours.  Invalid input(s): FREET3  Coagulation profile No results for input(s): INR, PROTIME in the last 168 hours. ------------------------------------------------------------------------------------------------------------------- No results for input(s): DDIMER in the last 72 hours. -------------------------------------------------------------------------------------------------------------------  Cardiac Enzymes No results for input(s): CKMB, TROPONINI, MYOGLOBIN in the last 168 hours.  Invalid input(s): CK ------------------------------------------------------------------------------------------------------------------    Component Value Date/Time   BNP 337.0 (H) 06/23/2019 1209     ---------------------------------------------------------------------------------------------------------------  Urinalysis    Component Value  Date/Time   COLORURINE YELLOW 12/28/2013 1706   APPEARANCEUR CLEAR 12/28/2013 1706   LABSPEC <1.005 (L) 12/28/2013 1706   PHURINE 5.5 12/28/2013 1706   GLUCOSEU NEGATIVE 12/28/2013 1706   HGBUR NEGATIVE 12/28/2013 1706   BILIRUBINUR NEGATIVE 12/28/2013 1706   KETONESUR NEGATIVE 12/28/2013 1706   PROTEINUR NEGATIVE 12/28/2013 1706   UROBILINOGEN 0.2 12/28/2013 1706   NITRITE NEGATIVE 12/28/2013 1706  LEUKOCYTESUR NEGATIVE 12/28/2013 1706    ----------------------------------------------------------------------------------------------------------------   Imaging Results:    DG Chest 2 View  Result Date: 06/23/2019 CLINICAL DATA:  Dyspnea. EXAM: CHEST - 2 VIEW COMPARISON:  April 20, 2018. FINDINGS: Stable cardiomediastinal silhouette with mild central pulmonary vascular congestion. No pneumothorax or pleural effusion is noted. No consolidative process is noted. Thoracic kyphosis is noted. Atherosclerosis of thoracic aorta is noted IMPRESSION: Mild central pulmonary vascular congestion is noted. Aortic Atherosclerosis (ICD10-I70.0). Electronically Signed   By: Marijo Conception M.D.   On: 06/23/2019 14:25   CT Angio Chest PE W/Cm &/Or Wo Cm  Result Date: 06/23/2019 CLINICAL DATA:  Shortness of breath with lower extremity edema. Chest tightness. EXAM: CT ANGIOGRAPHY CHEST WITH CONTRAST TECHNIQUE: Multidetector CT imaging of the chest was performed using the standard protocol during bolus administration of intravenous contrast. Multiplanar CT image reconstructions and MIPs were obtained to evaluate the vascular anatomy. CONTRAST:  143mL OMNIPAQUE IOHEXOL 350 MG/ML SOLN COMPARISON:  CT angiogram chest March 31, 2013; chest radiograph June 23, 2019 FINDINGS: Cardiovascular: There is no demonstrable pulmonary embolus. There is no thoracic aortic aneurysm. No thoracic aortic dissection. The contrast bolus in the aorta is somewhat less than optimal for dissection assessment. Visualized great  vessels appear normal. There are foci of aortic atherosclerosis. There are foci of coronary artery calcification. There is no pericardial effusion or pericardial thickening. Mediastinum/Nodes: Thyroid appears unremarkable. There is a focal lymph node in the right anterior precarinal region measuring 1.4 x 1.2 cm, enlarged but smaller than on previous study. No other lymph node enlargement seen currently. There are several subcentimeter mediastinal lymph nodes which do not meet size criteria for pathologic significance. No esophageal lesions are demonstrable. Lungs/Pleura: There is underlying centrilobular emphysematous change. There are scattered areas of atelectatic change bilaterally without edema or consolidation. No pleural effusions are evident. There are areas of lobular septal thickening, primarily in the lower lobes. No appreciable bronchiectasis. Upper Abdomen: Gallbladder absent. Apparent cyst in the posterior segment right lobe of the liver measuring 1.3 x 0.8 cm. There is a cyst in the left lobe of the liver anteriorly measuring 0.8 x 0.8 cm. Visualized upper abdominal structures otherwise appear unremarkable. Musculoskeletal: There is degenerative change in the thoracic spine. No blastic or lytic bone lesions. No chest wall lesions evident. Review of the MIP images confirms the above findings. IMPRESSION: 1. No demonstrable pulmonary embolus. No thoracic aortic aneurysm or dissection. There is aortic atherosclerosis. There are foci of coronary artery calcification. 2. Underlying centrilobular emphysematous change. Areas of mild atelectasis. No edema or airspace opacity. Lobular septal thickening in the bases likely represents chronic inflammation. No overt pulmonary edema. No pleural effusions. 3. Prominent precarinal lymph node, smaller than on previous study. No new adenopathy. 4.  Absent gallbladder. Aortic Atherosclerosis (ICD10-I70.0) and Emphysema (ICD10-J43.9). Electronically Signed   By: Lowella Grip III M.D.   On: 06/23/2019 15:00   US Venous Img Lower  Left (DVT Study)  Result Date: 06/23/2019 CLINICAL DATA:  74 year old female with leg swelling. EXAM: LEFT LOWER EXTREMITY VENOUS DOPPLER ULTRASOUND TECHNIQUE: Gray-scale sonography with compression, as well as color and duplex ultrasound, were performed to evaluate the deep venous system(s) from the level of the common femoral vein through the popliteal and proximal calf veins. COMPARISON:  None. FINDINGS: VENOUS Normal compressibility of the common femoral, superficial femoral, and popliteal veins, as well as the visualized calf veins. Visualized portions of profunda femoral vein and great saphenous vein unremarkable. No filling  defects to suggest DVT on grayscale or color Doppler imaging. Doppler waveforms show normal direction of venous flow, normal respiratory phasicity and response to augmentation. Limited views of the contralateral common femoral vein are unremarkable. OTHER Subcutaneous edema evident at the calf (image 32). Limitations: none IMPRESSION: No evidence of left lower extremity deep venous thrombosis. Electronically Signed   By: Genevie Ann M.D.   On: 06/23/2019 14:00    My personal review of EKG: Rhythm A fib at 140   Assessment & Plan:    Active Problems:   Essential hypertension, benign   Hypothyroidism   Chronic diastolic heart failure (H[facility])   Atrial fibrillation with RVR ([facility])  A. fib with RVR -Heart rate poorly controlled, in the 140s, did not respond to IV Cardizem push in ED, she is started on Cardizem drip, I will continue her home dose metoprolol XL 100 mg, first dose now as she did not get her morning dose, she is on Cardizem CD 240 mg, will start her on Cardizem 60 mg every 6 hours, hopefully her Cardizem drip can be titrated tomorrow where she can be resumed back on her home dose Cardizem CD of 240 mg. -Check TSH. -Potassium more than 4, will check magnesium level and aim for magnesium more than 2. -CTA  chest and venous Doppler negative for VTE.  Chronic diastolic CHF -She appears to be euvolemic, continue with home dose torsemide.  Hypertension -Blood pressure acceptable, actually on the lower side, so I will hold lisinopril, and continue with above meds as discussed under A. Fib.  Hypothyroidism -Continue Synthroid, check TSH.  Back pain -Likely related to her kyphoscoliosis, will start on heat pad, as needed Robaxin and Tylenol.    DVT Prophylaxis on xarelto  AM Labs Ordered, also please review Full Orders  Family Communication: Admission, patients condition and plan of care including tests being ordered have been discussed with the patient who indicate understanding and agree with the plan and Code Status  Code Status Full  Likely DC to  Home  Condition GUARDED    Consults called: None  Admission status: Observation  Time spent in minutes : 55 Minutes   Phillips Climes M.D on 06/23/2019 at 3:22 PM  Between 7am to 7pm - Pager - [phone]. After 7pm go to [email or web address] - password [email or web address]  [facility] - Office  [phone]

## 2019-06-23 NOTE — ED Triage Notes (Signed)
C/o shortness of breath for several days, history of a fib, also c/o back pain onset yesterday, states pain is better now

## 2019-06-23 NOTE — Progress Notes (Signed)
Pt c/o mid-lower back pain and is asking for additional pain medication. States she only takes Tylenol at home but has never had back pain like this before. Advised patient that PRN Tylenol and Robaxin could not be administered again until 2356. She asked that I page the MD for additional meds. Paged midlevel. Pt is currently standing at the bedside to alleviate pain.

## 2019-06-24 DIAGNOSIS — I10 Essential (primary) hypertension: Secondary | ICD-10-CM

## 2019-06-24 DIAGNOSIS — I4891 Unspecified atrial fibrillation: Secondary | ICD-10-CM | POA: Diagnosis not present

## 2019-06-24 DIAGNOSIS — M545 Low back pain, unspecified: Secondary | ICD-10-CM

## 2019-06-24 DIAGNOSIS — E039 Hypothyroidism, unspecified: Secondary | ICD-10-CM | POA: Diagnosis not present

## 2019-06-24 DIAGNOSIS — I5032 Chronic diastolic (congestive) heart failure: Secondary | ICD-10-CM

## 2019-06-24 LAB — CBC
HCT: 34.7 % — ABNORMAL LOW (ref 36.0–46.0)
Hemoglobin: 10.9 g/dL — ABNORMAL LOW (ref 12.0–15.0)
MCH: 31.3 pg (ref 26.0–34.0)
MCHC: 31.4 g/dL (ref 30.0–36.0)
MCV: 99.7 fL (ref 80.0–100.0)
Platelets: 197 10*3/uL (ref 150–400)
RBC: 3.48 MIL/uL — ABNORMAL LOW (ref 3.87–5.11)
RDW: 14.2 % (ref 11.5–15.5)
WBC: 4.1 10*3/uL (ref 4.0–10.5)
nRBC: 0 % (ref 0.0–0.2)

## 2019-06-24 LAB — MRSA PCR SCREENING: MRSA by PCR: NEGATIVE

## 2019-06-24 LAB — BASIC METABOLIC PANEL
Anion gap: 10 (ref 5–15)
BUN: 20 mg/dL (ref 8–23)
CO2: 27 mmol/L (ref 22–32)
Calcium: 9.2 mg/dL (ref 8.9–10.3)
Chloride: 102 mmol/L (ref 98–111)
Creatinine, Ser: 1.11 mg/dL — ABNORMAL HIGH (ref 0.44–1.00)
GFR calc Af Amer: 57 mL/min — ABNORMAL LOW (ref >60)
GFR calc non Af Amer: 49 mL/min — ABNORMAL LOW (ref >60)
Glucose, Bld: 90 mg/dL (ref 70–99)
Potassium: 3.5 mmol/L (ref 3.5–5.1)
Sodium: 139 mmol/L (ref 135–145)

## 2019-06-24 MED ORDER — OXYCODONE HCL 5 MG PO TABS
5.0000 mg | ORAL_TABLET | Freq: Four times a day (QID) | ORAL | Status: DC | PRN
  Administered 2019-06-24: 07:00:00 5 mg via ORAL
  Filled 2019-06-24: qty 1

## 2019-06-24 MED ORDER — METHOCARBAMOL 500 MG PO TABS: 500.0000 mg | ORAL_TABLET | Freq: Three times a day (TID) | ORAL | 0 refills | Status: DC | PRN

## 2019-06-24 MED ORDER — MORPHINE SULFATE (PF) 4 MG/ML IV SOLN
4.0000 mg | Freq: Once | INTRAVENOUS | Status: AC
  Administered 2019-06-24: 4 mg via INTRAVENOUS
  Filled 2019-06-24: qty 1

## 2019-06-24 MED ORDER — LISINOPRIL 20 MG PO TABS: 10.0000 mg | ORAL_TABLET | Freq: Every day | ORAL | 3 refills | Status: DC

## 2019-06-24 MED ORDER — DULOXETINE HCL 60 MG PO CPEP: 60.0000 mg | ORAL_CAPSULE | Freq: Every day | ORAL | 1 refills | Status: AC

## 2019-06-24 MED ORDER — RIVAROXABAN 15 MG PO TABS: 15.0000 mg | ORAL_TABLET | Freq: Every day | ORAL | Status: DC

## 2019-06-24 MED ORDER — MAGNESIUM OXIDE 400 MG PO TABS: 400.0000 mg | ORAL_TABLET | Freq: Every day | ORAL | 0 refills | Status: DC

## 2019-06-24 NOTE — Discharge Summary (Signed)
Physician Discharge Summary  Diane Cohen [id number] DOB: [birth year] DOA: 06/23/2019  PCP: Patient, No Pcp Per  Admit date: 06/23/2019 Discharge date: 06/24/2019  Time spent: 35 minutes  Recommendations for Outpatient Follow-up:  1. Repeat basic metabolic panel and magnesium to follow electrolytes level and renal function. 2. Reassess blood pressure and adjust antihypertensive regimen as needed.   Discharge Diagnoses:  Active Problems:   Essential hypertension, benign   Hypothyroidism   Chronic diastolic heart failure (HCC)   Atrial fibrillation with rapid ventricular response (HCC)   Acute bilateral low back pain without sciatica Gastroesophageal reflux disease Depression  Discharge Condition: Stable and improved.  Discharged home with instruction to follow-up with PCP as an outpatient and also to visit cardiology/electrophysiology service as previously scheduled.  CODE STATUS: Full code.  Diet recommendation: Heart healthy/low-sodium diet.  Filed Weights   06/23/19 1750 06/23/19 1800 06/24/19 0500  Weight: 76.7 kg 76.7 kg 75.9 kg    History of present illness:  As per H&P written by Dr. Waldron Labs on 06/23/2019 74 y.o. female,with past medical history of diastolic CHF, last known EF 60 to 65%, atrial fibrillation on Xarelto, hypertension, hyperlipidemia, hypothyroidism, patient presents to ED secondary to complaints of back pain started last week, as well as some intermittent dyspnea over the last week as well, she reports no provoking or activating factors for her dyspnea, reports some mild nasal congestion, which is typical for her allergies, reports her back pain is constant, resolved with hot showers, worsened with activity and certain positions, patient with known kyphoscoliosis, she denies any trauma or injury to her back, denies any chest pain, nausea, vomiting, diaphoresis, syncope. - in ED she was noted to have fib with RVR heart rate in the 140s, reports she did not  take her Cardizem or metoprolol this morning, CTA chest negative for PE, as well venous Dopplers with no evidence of DVT, potassium is at 4, she was started on Cardizem drip, and I was requested to admit.  Hospital Course:  1-A. fib with RVR -Rate adequately controlled after the use of IV Cardizem and continuation of metoprolol. -Continue Xarelto for secondary prevention -Will recommend basic metabolic panel follow-up visit along with magnesium level; trying to aim for potassium more than 4 and magnesium above 2. -TSH within normal limits -Patient will resume Cardizem 240 40 mg extended release along with her metoprolol XL 100 mg on daily basis. -Outpatient follow-up with cardiology/electrophysiology service recommended.  2-lower back pain -Musculoskeletal -No injury or fractures reported. -Chest x-ray demonstrating chronic thoracic kyphosis without other new abnormalities. -Patient reports at home having to help her husband who suffers from [place], into pivoting/lifting, and probably messed up her muscles back doing that. -Continue Tylenol and as needed Robaxin to assist with pain. -Continue increasing activity slowly. -Warm compresses/shower also recommended.  3-hypothyroidism -Continue Synthroid.  4-hypertension -Blood pressure soft blood stable -Resume home antihypertensive regimen and heart healthy diet.  5-chronic diastolic heart failure -Stable and compensated -Continue to follow low-sodium diet -Resume home torsemide regimen -Follow daily weights.  6-gastroesophageal reflux disease -Continue PPI.  7-depression -Continue Cymbalta -No suicidal ideation or hallucinations.  Procedures:  See below for x-ray reports.  Consultations:  None  Discharge Exam: Vitals:   06/24/19 1130 06/24/19 1200  BP: 94/60 (!) 97/53  Pulse: 65 (!) 39  Resp: 20 16  Temp:    SpO2:  92%    General: Afebrile, no chest pain, no shortness of breath, no nausea, no vomiting; denies  palpitation.  Still  with some lower back pain discomfort,  Cardiovascular: Rate controlled, no JVD, no rubs, no gallops. Respiratory: Good air movement bilaterally; no crackles, no wheezing. Abdomen: Soft, nontender, distended, positive bowel sounds Extremities: No edema, no cyanosis or clubbing.  Discharge Instructions   Discharge Instructions    (HEART FAILURE PATIENTS) Call MD:  Anytime you have any of the following symptoms: 1) 3 pound weight gain in 24 hours or 5 pounds in 1 week 2) shortness of breath, with or without a dry hacking cough 3) swelling in the hands, feet or stomach 4) if you have to sleep on extra pillows at night in order to breathe.   Complete by: As directed    Diet - low sodium heart healthy   Complete by: As directed    Discharge instructions   Complete by: As directed    Take medications as prescribed Arrange follow-up with PCP in 10 days Follow heart healthy/low-sodium diet and maintain adequate hydration.   Increase activity slowly   Complete by: As directed      Allergies as of 06/24/2019      Reactions   Levaquin [levofloxacin] Other (See Comments)   Headache    Sulfa Antibiotics Hives      Medication List    TAKE these medications   acetaminophen 650 MG CR tablet Commonly known as: TYLENOL Take 1,300 mg every 8 (eight) hours as needed by mouth for pain.   diltiazem 240 MG 24 hr capsule Commonly known as: CARDIZEM CD Take 1 capsule (240 mg total) by mouth daily.   diltiazem 30 MG tablet Commonly known as: Cardizem Take 1 tablet (30 mg total) by mouth 4 (four) times daily as needed (for atrial fibrillation).   DULoxetine 60 MG capsule Commonly known as: CYMBALTA Take 60 mg by mouth daily.   levothyroxine 137 MCG tablet Commonly known as: SYNTHROID Take 137 mcg by mouth daily before breakfast.   lisinopril 20 MG tablet Commonly known as: ZESTRIL Take 0.5 tablets (10 mg total) by mouth daily. What changed: how much to take    magnesium oxide 400 MG tablet Commonly known as: MAG-OX Take 1 tablet (400 mg total) by mouth daily.   methocarbamol 500 MG tablet Commonly known as: ROBAXIN Take 1 tablet (500 mg total) by mouth every 8 (eight) hours as needed for muscle spasms.   metoprolol succinate 100 MG 24 hr tablet Commonly known as: TOPROL-XL Take 100 mg by mouth daily.   pantoprazole 40 MG tablet Commonly known as: PROTONIX Take 1 tablet (40 mg total) by mouth daily. 30 minutes before breakfast   potassium chloride SA 20 MEQ tablet Commonly known as: KLOR-CON Take 2 tablets (40 mEq total) by mouth daily.   rivaroxaban 20 MG Tabs tablet Commonly known as: Xarelto TAKE 1 TABLET BY MOUTH EVERY DAY WITH DINNER   torsemide 20 MG tablet Commonly known as: DEMADEX TAKE 2 TABLETS BY MOUTH EVERY MORNING What changed: when to take this      Allergies  Allergen Reactions  . Levaquin [Levofloxacin] Other (See Comments)    Headache   . Sulfa Antibiotics Hives   Follow-up Information    Allred, Jeneen Rinks, MD. Schedule an appointment as soon as possible for a visit in 10 day(s).   Specialty: Cardiology Contact information: [address] [place] [address] [phone]        Satira Sark, MD .   Specialty: Cardiology Contact information: [address] [place] [address] [phone]  The results of significant diagnostics from this hospitalization (including imaging, microbiology, ancillary and laboratory) are listed below for reference.    Significant Diagnostic Studies: DG Chest 2 View  Result Date: 06/23/2019 CLINICAL DATA:  Dyspnea. EXAM: CHEST - 2 VIEW COMPARISON:  April 20, 2018. FINDINGS: Stable cardiomediastinal silhouette with mild central pulmonary vascular congestion. No pneumothorax or pleural effusion is noted. No consolidative process is noted. Thoracic kyphosis is noted. Atherosclerosis of thoracic aorta is noted IMPRESSION: Mild central  pulmonary vascular congestion is noted. Aortic Atherosclerosis (ICD10-I70.0). Electronically Signed   By: Marijo Conception M.D.   On: 06/23/2019 14:25   CT Angio Chest PE W/Cm &/Or Wo Cm  Result Date: 06/23/2019 CLINICAL DATA:  Shortness of breath with lower extremity edema. Chest tightness. EXAM: CT ANGIOGRAPHY CHEST WITH CONTRAST TECHNIQUE: Multidetector CT imaging of the chest was performed using the standard protocol during bolus administration of intravenous contrast. Multiplanar CT image reconstructions and MIPs were obtained to evaluate the vascular anatomy. CONTRAST:  136mL OMNIPAQUE IOHEXOL 350 MG/ML SOLN COMPARISON:  CT angiogram chest March 31, 2013; chest radiograph June 23, 2019 FINDINGS: Cardiovascular: There is no demonstrable pulmonary embolus. There is no thoracic aortic aneurysm. No thoracic aortic dissection. The contrast bolus in the aorta is somewhat less than optimal for dissection assessment. Visualized great vessels appear normal. There are foci of aortic atherosclerosis. There are foci of coronary artery calcification. There is no pericardial effusion or pericardial thickening. Mediastinum/Nodes: Thyroid appears unremarkable. There is a focal lymph node in the right anterior precarinal region measuring 1.4 x 1.2 cm, enlarged but smaller than on previous study. No other lymph node enlargement seen currently. There are several subcentimeter mediastinal lymph nodes which do not meet size criteria for pathologic significance. No esophageal lesions are demonstrable. Lungs/Pleura: There is underlying centrilobular emphysematous change. There are scattered areas of atelectatic change bilaterally without edema or consolidation. No pleural effusions are evident. There are areas of lobular septal thickening, primarily in the lower lobes. No appreciable bronchiectasis. Upper Abdomen: Gallbladder absent. Apparent cyst in the posterior segment right lobe of the liver measuring 1.3 x 0.8 cm. There  is a cyst in the left lobe of the liver anteriorly measuring 0.8 x 0.8 cm. Visualized upper abdominal structures otherwise appear unremarkable. Musculoskeletal: There is degenerative change in the thoracic spine. No blastic or lytic bone lesions. No chest wall lesions evident. Review of the MIP images confirms the above findings. IMPRESSION: 1. No demonstrable pulmonary embolus. No thoracic aortic aneurysm or dissection. There is aortic atherosclerosis. There are foci of coronary artery calcification. 2. Underlying centrilobular emphysematous change. Areas of mild atelectasis. No edema or airspace opacity. Lobular septal thickening in the bases likely represents chronic inflammation. No overt pulmonary edema. No pleural effusions. 3. Prominent precarinal lymph node, smaller than on previous study. No new adenopathy. 4.  Absent gallbladder. Aortic Atherosclerosis (ICD10-I70.0) and Emphysema (ICD10-J43.9). Electronically Signed   By: Lowella Grip III M.D.   On: 06/23/2019 15:00   US Venous Img Lower  Left (DVT Study)  Result Date: 06/23/2019 CLINICAL DATA:  74 year old female with leg swelling. EXAM: LEFT LOWER EXTREMITY VENOUS DOPPLER ULTRASOUND TECHNIQUE: Gray-scale sonography with compression, as well as color and duplex ultrasound, were performed to evaluate the deep venous system(s) from the level of the common femoral vein through the popliteal and proximal calf veins. COMPARISON:  None. FINDINGS: VENOUS Normal compressibility of the common femoral, superficial femoral, and popliteal veins, as well as the visualized calf veins. Visualized portions of  profunda femoral vein and great saphenous vein unremarkable. No filling defects to suggest DVT on grayscale or color Doppler imaging. Doppler waveforms show normal direction of venous flow, normal respiratory phasicity and response to augmentation. Limited views of the contralateral common femoral vein are unremarkable. OTHER Subcutaneous edema evident at  the calf (image 32). Limitations: none IMPRESSION: No evidence of left lower extremity deep venous thrombosis. Electronically Signed   By: Genevie Ann M.D.   On: 06/23/2019 14:00    Microbiology: Recent Results (from the past 240 hour(s))  Respiratory Panel by RT PCR (Flu A&B, Covid) - Nasopharyngeal Swab     Status: None   Collection Time: 06/23/19  4:23 PM   Specimen: Nasopharyngeal Swab  Result Value Ref Range Status   SARS Coronavirus 2 by RT PCR NEGATIVE NEGATIVE Final    Comment: (NOTE) SARS-CoV-2 target nucleic acids are NOT DETECTED. The SARS-CoV-2 RNA is generally detectable in upper respiratoy specimens during the acute phase of infection. The lowest concentration of SARS-CoV-2 viral copies this assay can detect is 131 copies/mL. A negative result does not preclude SARS-Cov-2 infection and should not be used as the sole basis for treatment or other patient management decisions. A negative result may occur with  improper specimen collection/handling, submission of specimen other than nasopharyngeal swab, presence of viral mutation(s) within the areas targeted by this assay, and inadequate number of viral copies (<131 copies/mL). A negative result must be combined with clinical observations, patient history, and epidemiological information. The expected result is Negative. Fact Sheet for Patients:  PinkCheek.be Fact Sheet for Healthcare Providers:  GravelBags.it This test is not yet ap proved or cleared by the [place] FDA and  has been authorized for detection and/or diagnosis of SARS-CoV-2 by FDA under an Emergency Use Authorization (EUA). This EUA will remain  in effect (meaning this test can be used) for the duration of the COVID-19 declaration under Section 564(b)(1) of the Act, 21 U.S.C. section 360bbb-3(b)(1), unless the authorization is terminated or revoked sooner.    Influenza A by PCR NEGATIVE NEGATIVE  Final   Influenza B by PCR NEGATIVE NEGATIVE Final    Comment: (NOTE) The Xpert Xpress SARS-CoV-2/FLU/RSV assay is intended as an aid in  the diagnosis of influenza from Nasopharyngeal swab specimens and  should not be used as a sole basis for treatment. Nasal washings and  aspirates are unacceptable for Xpert Xpress SARS-CoV-2/FLU/RSV  testing. Fact Sheet for Patients: PinkCheek.be Fact Sheet for Healthcare Providers: GravelBags.it This test is not yet approved or cleared by the [place] FDA and  has been authorized for detection and/or diagnosis of SARS-CoV-2 by  FDA under an Emergency Use Authorization (EUA). This EUA will remain  in effect (meaning this test can be used) for the duration of the  Covid-19 declaration under Section 564(b)(1) of the Act, 21  U.S.C. section 360bbb-3(b)(1), unless the authorization is  terminated or revoked. Performed at [facility], [address] [place], [place] [address]      Labs: Basic Metabolic Panel: Recent Labs  Lab 06/23/19 1209 06/23/19 1420 06/24/19 0614  NA 139  --  139  K 4.0  --  3.5  CL 103  --  102  CO2 26  --  27  GLUCOSE 99  --  90  BUN 13  --  20  CREATININE 0.89  --  1.11*  CALCIUM 10.4*  --  9.2  MG  --  2.3  --    Liver Function Tests: Recent  Labs  Lab 06/23/19 1209  AST 29  ALT 22  ALKPHOS 143*  BILITOT 0.8  PROT 8.2*  ALBUMIN 4.6   CBC: Recent Labs  Lab 06/23/19 1209 06/24/19 0614  WBC 5.1 4.1  NEUTROABS 3.1  --   HGB 12.7 10.9*  HCT 40.0 34.7*  MCV 97.8 99.7  PLT 270 197   BNP (last 3 results) Recent Labs    06/23/19 1209  BNP 337.0*    Signed:  Barton Dubois MD.  [facility] 06/24/2019, 12:30 PM

## 2019-06-27 ENCOUNTER — Encounter: Payer: Self-pay | Admitting: Cardiology

## 2019-06-27 ENCOUNTER — Ambulatory Visit: Payer: Medicare HMO | Admitting: Cardiology

## 2019-06-27 NOTE — Progress Notes (Deleted)
Cardiology Office Note  Date: 06/27/2019   ID: Diane, Cohen 06/15/1945, MRN [id number]  PCP:  Patient, No Pcp Per  Cardiologist:  Rozann Lesches, MD Electrophysiologist:  Thompson Grayer, MD   No chief complaint on file.   History of Present Illness: Diane Cohen is a 74 y.o. female last assessed via telehealth encounter in March.  I reviewed interval records, she was hospitalized recently in April with rapid atrial fibrillation.  Discharge summary indicates initial management with IV Cardizem, converted back to oral regimen and presumably left in atrial fibrillation.  Last cardioversion for atrial fibrillation was in February 2020.  Past Medical History:  Diagnosis Date  . Anxiety   . Atrial fibrillation (HCC)    Atrial fibrillation ablation in 2019 - Dr. Rayann Heman  . Chronic diastolic heart failure ([place])   . Depression   . Essential hypertension   . History of TIA (transient ischemic attack)   . Hyperlipidemia   . Hypothyroidism   . Pneumonia     Past Surgical History:  Procedure Laterality Date  . ABDOMINAL HYSTERECTOMY    . ABLATION OF DYSRHYTHMIC FOCUS  07/06/2017  . ATRIAL FIBRILLATION ABLATION N/A 07/06/2017   Procedure: ATRIAL FIBRILLATION ABLATION;  Surgeon: Thompson Grayer, MD;  Location: [facility];  Service: Cardiovascular;  Laterality: N/A;  . BREAST SURGERY     biopsy  . CARDIOVERSION N/A 01/23/2016   Procedure: CARDIOVERSION;  Surgeon: Satira Sark, MD;  [facility];  Service: Cardiovascular;  Laterality: N/A;  . CARDIOVERSION N/A 11/13/2016   Procedure: CARDIOVERSION;  Surgeon: Arnoldo Lenis, MD;  [facility];  Service: Endoscopy;  Laterality: N/A;  . CARDIOVERSION N/A 04/20/2018   Procedure: CARDIOVERSION;  Surgeon: [name], MD;  Location[facility];  Service: Cardiovascular;  Laterality: N/A;  . CHOLECYSTECTOMY    . COLONOSCOPY N/A 10/07/2018   Sigmoid and descending colon diverticulosis, three 6  to 15 mm polyps in rectum, ascending colon, and IC valve, one 6 mm polyp in cecum. (sessile serrated polyps and sessile serrated adenoma of ascending colon with focal high grade dysplasia.  . coloscopy    . ESOPHAGEAL DILATION  10/07/2018   Procedure: ESOPHAGEAL DILATION;  Surgeon: Daneil Dolin, MD;  [facility];  Service: Endoscopy;;  . ESOPHAGOGASTRODUODENOSCOPY N/A 10/07/2018   normal esophagus s/p dilation  . POLYPECTOMY  10/07/2018   Procedure: POLYPECTOMY;  Surgeon: Daneil Dolin, MD;  [facility];  Service: Endoscopy;;  Cecal polyps x 2 hot snare Ascending polyp x 1 Hot snare Rectal polyp x 1  . TEE WITHOUT CARDIOVERSION  07/06/2017  . TEE WITHOUT CARDIOVERSION N/A 07/06/2017   Procedure: TRANSESOPHAGEAL ECHOCARDIOGRAM (TEE);  Surgeon: Josue Hector, MD;  Location: [facility] ENDOSCOPY;  Service: Cardiovascular;  Laterality: N/A;    Current Outpatient Medications  Medication Sig Dispense Refill  . acetaminophen (TYLENOL) 650 MG CR tablet Take 1,300 mg every 8 (eight) hours as needed by mouth for pain.    Marland Kitchen diltiazem (CARDIZEM CD) 240 MG 24 hr capsule Take 1 capsule (240 mg total) by mouth daily. 90 capsule 1  . diltiazem (CARDIZEM) 30 MG tablet Take 1 tablet (30 mg total) by mouth 4 (four) times daily as needed (for atrial fibrillation). 60 tablet 3  . DULoxetine (CYMBALTA) 60 MG capsule Take 1 capsule (60 mg total) by mouth daily. 30 capsule 1  . levothyroxine (SYNTHROID, LEVOTHROID) 137 MCG tablet Take 137 mcg by mouth daily before  breakfast.     . lisinopril (ZESTRIL) 20 MG tablet Take 0.5 tablets (10 mg total) by mouth daily. 90 tablet 3  . magnesium oxide (MAG-OX) 400 MG tablet Take 1 tablet (400 mg total) by mouth daily. 20 tablet 0  . methocarbamol (ROBAXIN) 500 MG tablet Take 1 tablet (500 mg total) by mouth every 8 (eight) hours as needed for muscle spasms. 30 tablet 0  . metoprolol succinate (TOPROL-XL) 100 MG 24 hr tablet Take 100 mg by mouth daily.    .  pantoprazole (PROTONIX) 40 MG tablet Take 1 tablet (40 mg total) by mouth daily. 30 minutes before breakfast 30 tablet 3  . potassium chloride SA (K-DUR,KLOR-CON) 20 MEQ tablet Take 2 tablets (40 mEq total) by mouth daily. 180 tablet 3  . rivaroxaban (XARELTO) 20 MG TABS tablet TAKE 1 TABLET BY MOUTH EVERY DAY WITH DINNER 30 tablet 11  . torsemide (DEMADEX) 20 MG tablet TAKE 2 TABLETS BY MOUTH EVERY MORNING (Patient taking differently: Take 40 mg by mouth daily. ) 180 tablet 1   No current facility-administered medications for this visit.   Allergies:  Levaquin [levofloxacin] and Sulfa antibiotics   Social History: The patient  reports that she quit smoking about 46 years ago. Her smoking use included cigarettes. She smoked 0.50 packs per day. She has never used smokeless tobacco. She reports current alcohol use. She reports that she does not use drugs.   Family History: The patient's family history includes Anemia in her sister; Hypertension in her father, mother, sister, and another family member.   ROS:  Please see the history of present illness. Otherwise, complete review of systems is positive for {NONE DEFAULTED:18576::"none"}.  All other systems are reviewed and negative.   Physical Exam: VS:  There were no vitals taken for this visit., BMI There is no height or weight on file to calculate BMI.  Wt Readings from Last 3 Encounters:  06/24/19 167 lb 5.3 oz (75.9 kg)  05/09/19 162 lb (73.5 kg)  12/13/18 166 lb (75.3 kg)    General: Patient appears comfortable at rest. HEENT: Conjunctiva and lids normal, oropharynx clear with moist mucosa. Neck: Supple, no elevated JVP or carotid bruits, no thyromegaly. Lungs: Clear to auscultation, nonlabored breathing at rest. Cardiac: Regular rate and rhythm, no S3 or significant systolic murmur, no pericardial rub. Abdomen: Soft, nontender, no hepatomegaly, bowel sounds present, no guarding or rebound. Extremities: No pitting edema, distal pulses  2+. Skin: Warm and dry. Musculoskeletal: No kyphosis. Neuropsychiatric: Alert and oriented x3, affect grossly appropriate.  ECG:  An ECG dated 06/23/2019 was personally reviewed today and demonstrated:  Atrial fibrillation with RVR.  Recent Labwork: 06/23/2019: ALT 22; AST 29; B Natriuretic Peptide 337.0; Magnesium 2.3; TSH 0.520 06/24/2019: BUN 20; Creatinine, Ser 1.11; Hemoglobin 10.9; Platelets 197; Potassium 3.5; Sodium 139   Other Studies Reviewed Today:  Echocardiogram 04/21/2018: 1. The left ventricle has normal systolic function with an ejection  fraction of 60-65%. The cavity size was normal. There is mildly increased  left ventricular wall thickness. Left ventricular diastolic Doppler  parameters are consistent with  pseudonormalization Elevated mean left atrial pressure.  2. The right ventricle has normal systolic function. The cavity was  mildly enlarged. There is no increase in right ventricular wall thickness.  3. Left atrial size was mildly dilated.  4. The mitral valve is normal in structure. There is mild thickening. No  evidence of mitral valve stenosis.  5. The tricuspid valve is normal in structure.  6.  The aortic valve is tricuspid There is mild thickening of the aortic  valve.  7. The aortic root is normal in size and structure.  8. Right atrial pressure is estimated at 3 mmHg.   Assessment and Plan:    Medication Adjustments/Labs and Tests Ordered: Current medicines are reviewed at length with the patient today.  Concerns regarding medicines are outlined above.   Tests Ordered: No orders of the defined types were placed in this encounter.   Medication Changes: No orders of the defined types were placed in this encounter.   Disposition:  Follow up {follow up:15908}  Signed, Satira Sark, MD, [facility] 06/27/2019 8:11 AM    [facility] [place], [place], [place] [address] Phone: [phone]; Fax: [phone]

## 2019-06-28 ENCOUNTER — Ambulatory Visit (INDEPENDENT_AMBULATORY_CARE_PROVIDER_SITE_OTHER): Payer: Medicare HMO | Admitting: Cardiology

## 2019-06-28 ENCOUNTER — Other Ambulatory Visit: Payer: Self-pay

## 2019-06-28 ENCOUNTER — Encounter: Payer: Self-pay | Admitting: Cardiology

## 2019-06-28 VITALS — BP 122/72 | HR 65 | Ht 61.0 in | Wt 168.0 lb

## 2019-06-28 DIAGNOSIS — I4819 Other persistent atrial fibrillation: Secondary | ICD-10-CM | POA: Diagnosis not present

## 2019-06-28 DIAGNOSIS — I5032 Chronic diastolic (congestive) heart failure: Secondary | ICD-10-CM

## 2019-06-28 NOTE — Progress Notes (Signed)
Cardiology Office Note  Date: 06/28/2019   ID: Cherell, Marke [birth year], MRN [id number]  PCP:  Celene Squibb, MD  Cardiologist:  Rozann Lesches, MD Electrophysiologist:  Thompson Grayer, MD   Chief Complaint  Patient presents with  . Hospitalization Follow-up    History of Present Illness: Diane Cohen is a 74 y.o. female last assessed via telehealth encounter in March.  I reviewed interval records, she was recently admitted to [facility] over the weekend.  She states that she went there due to lower back pain, was noted to be in rapid atrial fibrillation at the time.  She had not taken her morning AV nodal blocker regimen because she thought she would be back home in time to do it.  She was observed overnight back on her regular regimen with adequate heart rate control and ultimately discharged to follow-up with me.  She states that her back is still uncomfortable, does not recall injuring it in any way.  She has been using a muscle relaxant.  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  She has been very stressed related to this.  It is not entirely clear how long she has been in atrial fibrillation.  At our last telehealth encounter in March her heart rate was normal and she did not report any obvious palpitations at that time.  Her last ECG was in February 2020.  It was at that time that she had a cardioversion for recurrent atrial fibrillation.  I personally reviewed her ECG today which shows rate controlled atrial fibrillation.  Past Medical History:  Diagnosis Date  . Anxiety   . Atrial fibrillation (HCC)    Atrial fibrillation ablation in 2019 - Dr. Rayann Heman  . Chronic diastolic heart failure ([place])   . Depression   . Essential hypertension   . History of TIA (transient ischemic attack)   . Hyperlipidemia   . Hypothyroidism   . Pneumonia     Past Surgical  History:  Procedure Laterality Date  . ABDOMINAL HYSTERECTOMY    . ABLATION OF DYSRHYTHMIC FOCUS  07/06/2017  . ATRIAL FIBRILLATION ABLATION N/A 07/06/2017   Procedure: ATRIAL FIBRILLATION ABLATION;  Surgeon: Thompson Grayer, MD;  Location: [place] [facility];  Service: Cardiovascular;  Laterality: N/A;  . BREAST SURGERY     biopsy  . CARDIOVERSION N/A 01/23/2016   Procedure: CARDIOVERSION;  Surgeon: Satira Sark, MD;  [facility];  Service: Cardiovascular;  Laterality: N/A;  . CARDIOVERSION N/A 11/13/2016   Procedure: CARDIOVERSION;  Surgeon: Arnoldo Lenis, MD;  Location: [facility];  Service: Endoscopy;  Laterality: N/A;  . CARDIOVERSION N/A 04/20/2018   Procedure: CARDIOVERSION;  Surgeon: [name], MD;  Location: [facility] ENDOSCOPY;  Service: Cardiovascular;  Laterality: N/A;  . CHOLECYSTECTOMY    . COLONOSCOPY N/A 10/07/2018   Sigmoid and descending colon diverticulosis, three 6 to 15 mm polyps in rectum, ascending colon, and IC valve, one 6 mm polyp in cecum. (sessile serrated polyps and sessile serrated adenoma of ascending colon with focal high grade dysplasia.  . coloscopy    . ESOPHAGEAL DILATION  10/07/2018   Procedure: ESOPHAGEAL DILATION;  Surgeon: Daneil Dolin, MD;  Location[facility];  Service: Endoscopy;;  . ESOPHAGOGASTRODUODENOSCOPY N/A 10/07/2018   normal esophagus s/p dilation  . POLYPECTOMY  10/07/2018   Procedure: POLYPECTOMY;  Surgeon: Manus Rudd  M, MD;  Location[facility];  Service: Endoscopy;;  Cecal polyps x 2 hot snare Ascending polyp x 1 Hot snare Rectal polyp x 1  . TEE WITHOUT CARDIOVERSION  07/06/2017  . TEE WITHOUT CARDIOVERSION N/A 07/06/2017   Procedure: TRANSESOPHAGEAL ECHOCARDIOGRAM (TEE);  Surgeon: Josue Hector, MD;  Location: [facility] ENDOSCOPY;  Service: Cardiovascular;  Laterality: N/A;    Current Outpatient Medications  Medication Sig Dispense Refill  . acetaminophen (TYLENOL) 650 MG CR tablet Take 1,300 mg every  8 (eight) hours as needed by mouth for pain.    Marland Kitchen diltiazem (CARDIZEM CD) 240 MG 24 hr capsule Take 1 capsule (240 mg total) by mouth daily. 90 capsule 1  . diltiazem (CARDIZEM) 30 MG tablet Take 1 tablet (30 mg total) by mouth 4 (four) times daily as needed (for atrial fibrillation). 60 tablet 3  . DULoxetine (CYMBALTA) 60 MG capsule Take 1 capsule (60 mg total) by mouth daily. 30 capsule 1  . levothyroxine (SYNTHROID, LEVOTHROID) 137 MCG tablet Take 137 mcg by mouth daily before breakfast.     . lisinopril (ZESTRIL) 20 MG tablet Take 0.5 tablets (10 mg total) by mouth daily. 90 tablet 3  . magnesium oxide (MAG-OX) 400 MG tablet Take 1 tablet (400 mg total) by mouth daily. 20 tablet 0  . methocarbamol (ROBAXIN) 500 MG tablet Take 1 tablet (500 mg total) by mouth every 8 (eight) hours as needed for muscle spasms. 30 tablet 0  . metoprolol succinate (TOPROL-XL) 100 MG 24 hr tablet Take 100 mg by mouth daily.    . pantoprazole (PROTONIX) 40 MG tablet Take 1 tablet (40 mg total) by mouth daily. 30 minutes before breakfast 30 tablet 3  . potassium chloride SA (K-DUR,KLOR-CON) 20 MEQ tablet Take 2 tablets (40 mEq total) by mouth daily. 180 tablet 3  . rivaroxaban (XARELTO) 20 MG TABS tablet TAKE 1 TABLET BY MOUTH EVERY DAY WITH DINNER 30 tablet 11  . torsemide (DEMADEX) 20 MG tablet TAKE 2 TABLETS BY MOUTH EVERY MORNING (Patient taking differently: Take 40 mg by mouth daily. ) 180 tablet 1   No current facility-administered medications for this visit.   Allergies:  Levaquin [levofloxacin] and Sulfa antibiotics   ROS:   Fatigue, lower back pain.  Physical Exam: VS:  BP 122/72   Pulse 65   Ht 5\' 1"  (1.549 m)   Wt 168 lb (76.2 kg)   SpO2 93%   BMI 31.74 kg/m , BMI Body mass index is 31.74 kg/m.  Wt Readings from Last 3 Encounters:  06/28/19 168 lb (76.2 kg)  06/24/19 167 lb 5.3 oz (75.9 kg)  05/09/19 162 lb (73.5 kg)    General: Patient appears comfortable at rest. HEENT: Conjunctiva and  lids normal, wearing a mask. Neck: Supple, no elevated JVP or carotid bruits, no thyromegaly. Lungs: Clear to auscultation, nonlabored breathing at rest. Cardiac: Irregularly irregular, no S3 or significant systolic murmur, no pericardial rub. Abdomen: Soft, bowel sounds present. Extremities: No pitting edema, distal pulses 2+.   ECG:  An ECG dated 06/23/2019 was personally reviewed today and demonstrated:  Atrial fibrillation with RVR.  Recent Labwork: 06/23/2019: ALT 22; AST 29; B Natriuretic Peptide 337.0; Magnesium 2.3; TSH 0.520 06/24/2019: BUN 20; Creatinine, Ser 1.11; Hemoglobin 10.9; Platelets 197; Potassium 3.5; Sodium 139   Other Studies Reviewed Today:  Echocardiogram 04/21/2018: 1. The left ventricle has normal systolic function with an ejection  fraction of 60-65%. The cavity size was normal. There is mildly increased  left  ventricular wall thickness. Left ventricular diastolic Doppler  parameters are consistent with  pseudonormalization Elevated mean left atrial pressure.  2. The right ventricle has normal systolic function. The cavity was  mildly enlarged. There is no increase in right ventricular wall thickness.  3. Left atrial size was mildly dilated.  4. The mitral valve is normal in structure. There is mild thickening. No  evidence of mitral valve stenosis.  5. The tricuspid valve is normal in structure.  6. The aortic valve is tricuspid There is mild thickening of the aortic  valve.  7. The aortic root is normal in size and structure.  8. Right atrial pressure is estimated at 3 mmHg.   Assessment and Plan:  1.  Persistent atrial fibrillation, CHA2DS2-VASc score is 6.  She reports consistent use of her Xarelto.  She is in atrial fibrillation at this time although rate controlled on current doses of Toprol-XL and Cardizem CD.  It is not clear how long she has been in atrial fibrillation.  I talked with her about elective cardioversion, she wanted to hold off  for now but will come back to the office in a few weeks to see how she is doing.  We could potentially adopt a strategy of permanent atrial fibrillation, although she did undergo a successful atrial fibrillation ablation in 2019 and has done reasonably well maintaining sinus rhythm in the interim.  She did require cardioversion in February 2020.  2.  Chronic diastolic heart failure by history, weight has been stable.  She continues on Demadex.  Medication Adjustments/Labs and Tests Ordered: Current medicines are reviewed at length with the patient today.  Concerns regarding medicines are outlined above.   Tests Ordered: Orders Placed This Encounter  Procedures  . EKG 12-Lead    Medication Changes: No orders of the defined types were placed in this encounter.   Disposition:  Follow up 2 weeks with [place] in the [place] office.  Signed, Satira Sark, MD, [facility] 06/28/2019 12:03 PM    [facility] [address], [place], [place] [address] Phone: [phone]; Fax: [phone]

## 2019-06-28 NOTE — Patient Instructions (Signed)
Medication Instructions:  Continue all current medications.  Labwork: none  Testing/Procedures: none  Follow-Up: 2 weeks   Any Other Special Instructions Will Be Listed Below (If Applicable).  If you need a refill on your cardiac medications before your next appointment, please call your pharmacy.

## 2019-07-12 ENCOUNTER — Ambulatory Visit (INDEPENDENT_AMBULATORY_CARE_PROVIDER_SITE_OTHER): Payer: Medicare HMO | Admitting: Student

## 2019-07-12 ENCOUNTER — Other Ambulatory Visit: Payer: Self-pay

## 2019-07-12 ENCOUNTER — Encounter: Payer: Self-pay | Admitting: Student

## 2019-07-12 VITALS — BP 120/70 | HR 72 | Ht 61.0 in | Wt 170.6 lb

## 2019-07-12 DIAGNOSIS — I4819 Other persistent atrial fibrillation: Secondary | ICD-10-CM | POA: Diagnosis not present

## 2019-07-12 DIAGNOSIS — I5032 Chronic diastolic (congestive) heart failure: Secondary | ICD-10-CM

## 2019-07-12 DIAGNOSIS — E039 Hypothyroidism, unspecified: Secondary | ICD-10-CM

## 2019-07-12 DIAGNOSIS — I10 Essential (primary) hypertension: Secondary | ICD-10-CM | POA: Diagnosis not present

## 2019-07-12 MED ORDER — LISINOPRIL 10 MG PO TABS: 10.0000 mg | ORAL_TABLET | Freq: Every day | ORAL | 3 refills | Status: DC

## 2019-07-12 MED ORDER — DILTIAZEM HCL ER COATED BEADS 300 MG PO CP24: 300.0000 mg | ORAL_CAPSULE | Freq: Every day | ORAL | 5 refills | Status: DC

## 2019-07-12 NOTE — Patient Instructions (Signed)
Medication Instructions:  Your physician has recommended you make the following change in your medication:  Increase Cardizem CD to 300 mg Daily  Call in 2 weeks and update [place] on your symptoms.   *If you need a refill on your cardiac medications before your next appointment, please call your pharmacy*   Lab Work: NONE   If you have labs (blood work) drawn today and your tests are completely normal, you will receive your results only by: Marland Kitchen MyChart Message (if you have MyChart) OR . A paper copy in the mail If you have any lab test that is abnormal or we need to change your treatment, we will call you to review the results.   Testing/Procedures: NONE    Follow-Up: At [facility], you and your health needs are our priority.  As part of our continuing mission to provide you with exceptional heart care, we have created designated Provider Care Teams.  These Care Teams include your primary Cardiologist (physician) and Advanced Practice Providers (APPs -  Physician Assistants and Nurse Practitioners) who all work together to provide you with the care you need, when you need it.  We recommend signing up for the patient portal called "MyChart".  Sign up information is provided on this After Visit Summary.  MyChart is used to connect with patients for Virtual Visits (Telemedicine).  Patients are able to view lab/test results, encounter notes, upcoming appointments, etc.  Non-urgent messages can be sent to your provider as well.   To learn more about what you can do with MyChart, go to [email or web address].    Your next appointment:   4 month(s)  The format for your next appointment:   In Person  Provider:   Rozann Lesches, MD   Other Instructions Thank you for choosing [facility]!

## 2019-07-12 NOTE — Progress Notes (Signed)
Cardiology Office Note    Date:  07/13/2019   ID:  Diane Cohen, DOB January 11, 1946, MRN [id number]  PCP:  Celene Squibb, MD  Cardiologist: Rozann Lesches, MD    Chief Complaint  Patient presents with  . Follow-up    History of Present Illness:    Diane Cohen is a 74 y.o. female with past medical history of paroxysmal atrial fibrillation (s/p ablation in 2019, DCCV in 04/2018), chronic diastolic CHF, HTN, HLD, Hypothyroidism and history of TIA who presents to the office today for 2-week follow-up.   She was last examined by Dr. Domenic Polite on 06/28/2019 and had recently been admitted to [facility] for evaluation of lower back pain and found to be in atrial fibrillation with RVR but she had not taken any of her morning medications. She was restarted on her home regimen and HR improved. She was still in atrial fibrillation at the time of her visit but rates remained well-controlled. She was continued on Toprol-XL, Cardizem CD and Xarelto with close follow-up arranged to reassess symptoms and to see if a strategy of permanent atrial fibrillation should be pursued or to consider repeat DCCV.   In talking with the patient today, she reports being under increased stress as she is the primary caregiver for her husband with ALS. They previously had a caregiver and are in the process of trying to find another one but have not had success thus far.   She does report intermittent palpitations but denies any tachy-palpitations. Does not check her HR or BP regularly at home but HR is in the 70's during today's visit. She denies any chest pain or dyspnea on exertion. No recent orthopnea, PND or edema. She reports good compliance with Xarelto and denies any evidence of active bleeding.    Past Medical History:  Diagnosis Date  . Anxiety   . Atrial fibrillation (HCC)    Atrial fibrillation ablation in 2019 - Dr. Rayann Heman  . Chronic diastolic heart failure ([place])   . Depression   . Essential hypertension     . History of TIA (transient ischemic attack)   . Hyperlipidemia   . Hypothyroidism   . Pneumonia     Past Surgical History:  Procedure Laterality Date  . ABDOMINAL HYSTERECTOMY    . ABLATION OF DYSRHYTHMIC FOCUS  07/06/2017  . ATRIAL FIBRILLATION ABLATION N/A 07/06/2017   Procedure: ATRIAL FIBRILLATION ABLATION;  Surgeon: Thompson Grayer, MD;  Location: [facility];  Service: Cardiovascular;  Laterality: N/A;  . BREAST SURGERY     biopsy  . CARDIOVERSION N/A 01/23/2016   Procedure: CARDIOVERSION;  Surgeon: Satira Sark, MD;  [facility];  Service: Cardiovascular;  Laterality: N/A;  . CARDIOVERSION N/A 11/13/2016   Procedure: CARDIOVERSION;  Surgeon: Arnoldo Lenis, MD;  Location: [facility];  Service: Endoscopy;  Laterality: N/A;  . CARDIOVERSION N/A 04/20/2018   Procedure: CARDIOVERSION;  Surgeon: [name], MD;  Location: [facility] ENDOSCOPY;  Service: Cardiovascular;  Laterality: N/A;  . CHOLECYSTECTOMY    . COLONOSCOPY N/A 10/07/2018   Sigmoid and descending colon diverticulosis, three 6 to 15 mm polyps in rectum, ascending colon, and IC valve, one 6 mm polyp in cecum. (sessile serrated polyps and sessile serrated adenoma of ascending colon with focal high grade dysplasia.  . coloscopy    . ESOPHAGEAL DILATION  10/07/2018   Procedure: ESOPHAGEAL DILATION;  Surgeon: Daneil Dolin, MD;  Location: [facility];  Service: Endoscopy;;  . ESOPHAGOGASTRODUODENOSCOPY  N/A 10/07/2018   normal esophagus s/p dilation  . POLYPECTOMY  10/07/2018   Procedure: POLYPECTOMY;  Surgeon: Daneil Dolin, MD;  Location: [facility];  Service: Endoscopy;;  Cecal polyps x 2 hot snare Ascending polyp x 1 Hot snare Rectal polyp x 1  . TEE WITHOUT CARDIOVERSION  07/06/2017  . TEE WITHOUT CARDIOVERSION N/A 07/06/2017   Procedure: TRANSESOPHAGEAL ECHOCARDIOGRAM (TEE);  Surgeon: Josue Hector, MD;  Location: [facility] ENDOSCOPY;  Service: Cardiovascular;  Laterality: N/A;     Current Medications: Outpatient Medications Prior to Visit  Medication Sig Dispense Refill  . acetaminophen (TYLENOL) 650 MG CR tablet Take 1,300 mg every 8 (eight) hours as needed by mouth for pain.    Marland Kitchen diltiazem (CARDIZEM) 30 MG tablet Take 1 tablet (30 mg total) by mouth 4 (four) times daily as needed (for atrial fibrillation). 60 tablet 3  . DULoxetine (CYMBALTA) 60 MG capsule Take 1 capsule (60 mg total) by mouth daily. 30 capsule 1  . levothyroxine (SYNTHROID, LEVOTHROID) 137 MCG tablet Take 137 mcg by mouth daily before breakfast.     . magnesium oxide (MAG-OX) 400 MG tablet Take 1 tablet (400 mg total) by mouth daily. 20 tablet 0  . metoprolol succinate (TOPROL-XL) 100 MG 24 hr tablet Take 100 mg by mouth daily.    . pantoprazole (PROTONIX) 40 MG tablet Take 1 tablet (40 mg total) by mouth daily. 30 minutes before breakfast 30 tablet 3  . potassium chloride SA (K-DUR,KLOR-CON) 20 MEQ tablet Take 2 tablets (40 mEq total) by mouth daily. 180 tablet 3  . rivaroxaban (XARELTO) 20 MG TABS tablet TAKE 1 TABLET BY MOUTH EVERY DAY WITH DINNER 30 tablet 11  . torsemide (DEMADEX) 20 MG tablet TAKE 2 TABLETS BY MOUTH EVERY MORNING (Patient taking differently: Take 40 mg by mouth daily. ) 180 tablet 1  . diltiazem (CARDIZEM CD) 240 MG 24 hr capsule Take 1 capsule (240 mg total) by mouth daily. 90 capsule 1  . lisinopril (ZESTRIL) 20 MG tablet Take 0.5 tablets (10 mg total) by mouth daily. 90 tablet 3  . methocarbamol (ROBAXIN) 500 MG tablet Take 1 tablet (500 mg total) by mouth every 8 (eight) hours as needed for muscle spasms. 30 tablet 0   No facility-administered medications prior to visit.     Allergies:   Levaquin [levofloxacin] and Sulfa antibiotics   Social History   Socioeconomic History  . Marital status: Married    Spouse name: Not on file  . Number of children: Not on file  . Years of education: Not on file  . Highest education level: Not on file  Occupational History  .  Not on file  Tobacco Use  . Smoking status: Former Smoker    Packs/day: 0.50    Types: Cigarettes    Quit date: 01/22/1973    Years since quitting: 46.5  . Smokeless tobacco: Never Used  Substance and Sexual Activity  . Alcohol use: Not Currently    Alcohol/week: 0.0 standard drinks    Comment: 3 times a year: Christmas, Thanksgiving, and maybe just because   . Drug use: No  . Sexual activity: Yes    Birth control/protection: Surgical  Other Topics Concern  . Not on file  Social History Narrative   Lives with spouse in [place] has ALS and is dependant on the patient for transition from bed to chair.   Social Determinants of Health   Financial Resource Strain:   . Difficulty of Paying  Living Expenses:   Food Insecurity:   . Worried About Charity fundraiser in the Last Year:   . Arboriculturist in the Last Year:   Transportation Needs:   . Film/video editor (Medical):   Marland Kitchen Lack of Transportation (Non-Medical):   Physical Activity:   . Days of Exercise per Week:   . Minutes of Exercise per Session:   Stress:   . Feeling of Stress :   Social Connections:   . Frequency of Communication with Friends and Family:   . Frequency of Social Gatherings with Friends and Family:   . Attends Religious Services:   . Active Member of Clubs or Organizations:   . Attends Archivist Meetings:   Marland Kitchen Marital Status:      Family History:  The patient's family history includes Anemia in her sister; Hypertension in her father, mother, sister, and another family member.   Review of Systems:   Please see the history of present illness.     General:  No chills, fever, night sweats or weight changes.  Cardiovascular:  No chest pain, dyspnea on exertion, edema, orthopnea, paroxysmal nocturnal dyspnea. Positive for palpitations.  Dermatological: No rash, lesions/masses Respiratory: No cough, dyspnea Urologic: No hematuria, dysuria Abdominal:   No nausea, vomiting,  diarrhea, bright red blood per rectum, melena, or hematemesis Neurologic:  No visual changes, wkns, changes in mental status. All other systems reviewed and are otherwise negative except as noted above.   Physical Exam:    VS:  BP 120/70   Pulse 72   Ht 5\' 1"  (1.549 m)   Wt 170 lb 9.6 oz (77.4 kg)   SpO2 98%   BMI 32.23 kg/m    General: Well developed, well nourished,female appearing in no acute distress. Head: Normocephalic, atraumatic, sclera non-icteric.  Neck: No carotid bruits. JVD not elevated.  Lungs: Respirations regular and unlabored, without wheezes or rales.  Heart: Irregularly irregular. No S3 or S4.  No murmur, no rubs, or gallops appreciated. Abdomen: Soft, non-tender, non-distended. No obvious abdominal masses. Msk:  Strength and tone appear normal for age. No obvious joint deformities or effusions. Extremities: No clubbing or cyanosis. Trace ankle edema with varicose veins noted.  Distal pedal pulses are 2+ bilaterally. Neuro: Alert and oriented X 3. Moves all extremities spontaneously. No focal deficits noted. Psych:  Responds to questions appropriately with a normal affect. Skin: No rashes or lesions noted  Wt Readings from Last 3 Encounters:  07/12/19 170 lb 9.6 oz (77.4 kg)  06/28/19 168 lb (76.2 kg)  06/24/19 167 lb 5.3 oz (75.9 kg)     Studies/Labs Reviewed:   EKG:  EKG is not ordered today.   Recent Labs: 06/23/2019: ALT 22; B Natriuretic Peptide 337.0; Magnesium 2.3; TSH 0.520 06/24/2019: BUN 20; Creatinine, Ser 1.11; Hemoglobin 10.9; Platelets 197; Potassium 3.5; Sodium 139   Lipid Panel No results found for: CHOL, TRIG, HDL, CHOLHDL, VLDL, LDLCALC, LDLDIRECT  Additional studies/ records that were reviewed today include:   Echocardiogram: 04/2018 IMPRESSIONS    1. The left ventricle has normal systolic function with an ejection  fraction of 60-65%. The cavity size was normal. There is mildly increased  left ventricular wall thickness. Left  ventricular diastolic Doppler  parameters are consistent with  pseudonormalization Elevated mean left atrial pressure.  2. The right ventricle has normal systolic function. The cavity was  mildly enlarged. There is no increase in right ventricular wall thickness.  3. Left atrial size was mildly  dilated.  4. The mitral valve is normal in structure. There is mild thickening. No  evidence of mitral valve stenosis.  5. The tricuspid valve is normal in structure.  6. The aortic valve is tricuspid There is mild thickening of the aortic  valve.  7. The aortic root is normal in size and structure.  8. Right atrial pressure is estimated at 3 mmHg.   Assessment:    1. Persistent atrial fibrillation ([place])   2. Chronic diastolic heart failure ([place])   3. Essential hypertension   4. Hypothyroidism, unspecified type      Plan:   In order of problems listed above:  1. Persistent Atrial Fibrillation - she is s/p ablation in 2019 with repeat DCCV in 04/2018. She does report intermittent palpitations but denies any tachy-palpitations and HR is well-controlled during today's visit. We reviewed options today in regards to a rate-control strategy or repeat DCCV and at this time she prefers medication adjustments as she is the primary caregiver for her husband and has been under increased stress with this. She is currently on Cardizem CD 240mg  daily and Toprol-XL 100mg  daily. Will titrate Cardizem CD to 300mg  daily. She has to rely on family members to watch her husband when she has appointments and prefers to call back in a few weeks to report on symptoms. If palpitations have not improved, would again discuss DCCV but hopefully titration of Cardizem CD will provide some improvement.  - continue Xarelto 20mg  daily for anticoagulation. No evidence of active bleeding.   2. Chronic Diastolic CHF - she denies any recent orthopnea, PND or edema. Breathing has been at baseline and she only has trace ankle  edema on examination today. Continue Torsemide 40mg  daily. Creatinine was stable at 1.11 by recent labs in 06/2019.  3. HTN - BP is well-controlled at 120/70 during today's visit. Continue current medication regimen with dose adjustment of Cardizem CD as outlined above.   4. Hypothyroidism - TSH WNL at 0.520 when checked in 06/2019. Continue Synthroid 137 mcg daily.    Medication Adjustments/Labs and Tests Ordered: Current medicines are reviewed at length with the patient today.  Concerns regarding medicines are outlined above.  Medication changes, Labs and Tests ordered today are listed in the Patient Instructions below. Patient Instructions  Medication Instructions:  Your physician has recommended you make the following change in your medication:  Increase Cardizem CD to 300 mg Daily  Call in 2 weeks and update [place] on your symptoms.   *If you need a refill on your cardiac medications before your next appointment, please call your pharmacy*   Lab Work: NONE   If you have labs (blood work) drawn today and your tests are completely normal, you will receive your results only by: Marland Kitchen MyChart Message (if you have MyChart) OR . A paper copy in the mail If you have any lab test that is abnormal or we need to change your treatment, we will call you to review the results.   Testing/Procedures: NONE    Follow-Up: At [facility], you and your health needs are our priority.  As part of our continuing mission to provide you with exceptional heart care, we have created designated Provider Care Teams.  These Care Teams include your primary Cardiologist (physician) and Advanced Practice Providers (APPs -  Physician Assistants and Nurse Practitioners) who all work together to provide you with the care you need, when you need it.  We recommend signing up for the patient portal called "MyChart".  Sign up information is provided on this After Visit Summary.  MyChart is used to connect with patients  for Virtual Visits (Telemedicine).  Patients are able to view lab/test results, encounter notes, upcoming appointments, etc.  Non-urgent messages can be sent to your provider as well.   To learn more about what you can do with MyChart, go to [email or web address].    Your next appointment:   4 month(s)  The format for your next appointment:   In Person  Provider:   Rozann Lesches, MD   Other Instructions Thank you for choosing [facility]!       Signed, Erma Heritage, PA-C  07/13/2019 8:21 AM    [facility] [address] [place], [place] [address] Phone: [phone] Fax: [phone]

## 2019-07-13 ENCOUNTER — Encounter: Payer: Self-pay | Admitting: Student

## 2019-08-03 ENCOUNTER — Other Ambulatory Visit (HOSPITAL_COMMUNITY): Payer: Self-pay | Admitting: Internal Medicine

## 2019-08-03 DIAGNOSIS — Z13820 Encounter for screening for osteoporosis: Secondary | ICD-10-CM

## 2019-08-03 DIAGNOSIS — Z1231 Encounter for screening mammogram for malignant neoplasm of breast: Secondary | ICD-10-CM

## 2019-08-30 ENCOUNTER — Other Ambulatory Visit: Payer: Self-pay | Admitting: Gastroenterology

## 2019-08-30 NOTE — Telephone Encounter (Signed)
Is patient still taking this medication. Looks like it was last filled in January 2021.

## 2019-08-31 NOTE — Telephone Encounter (Signed)
Spoke with pt. Pt is taking Pantoprazole. Pt had some extra pills at home and didn't need it refilled until now.

## 2019-08-31 NOTE — Telephone Encounter (Signed)
Noted.  Rx sent.

## 2019-10-05 ENCOUNTER — Other Ambulatory Visit: Payer: Self-pay | Admitting: Cardiology

## 2019-11-27 ENCOUNTER — Encounter: Payer: Self-pay | Admitting: Cardiology

## 2019-11-27 ENCOUNTER — Ambulatory Visit (INDEPENDENT_AMBULATORY_CARE_PROVIDER_SITE_OTHER): Payer: Medicare HMO | Admitting: Cardiology

## 2019-11-27 DIAGNOSIS — I5032 Chronic diastolic (congestive) heart failure: Secondary | ICD-10-CM | POA: Diagnosis not present

## 2019-11-27 DIAGNOSIS — I4819 Other persistent atrial fibrillation: Secondary | ICD-10-CM

## 2019-11-27 MED ORDER — TORSEMIDE 20 MG PO TABS: 60.0000 mg | ORAL_TABLET | Freq: Every morning | ORAL | 3 refills | Status: DC

## 2019-11-27 NOTE — Patient Instructions (Addendum)
Your physician wants you to follow-up in 2-3 [place]   Your physician has recommended you make the following change in your medication:   INCREASE TORSEMIDE 60 MG IN THE MORNING (3 TABLETS)  Your physician has requested that you have an echocardiogram. Echocardiography is a painless test that uses sound waves to create images of your heart. It provides your doctor with information about the size and shape of your heart and how well your heart's chambers and valves are working. This procedure takes approximately one hour. There are no restrictions for this procedure.  Your physician recommends that you return for lab work in: CBC/BMP   Thank you for choosing [facility]!!

## 2019-11-27 NOTE — Addendum Note (Signed)
Addended by: Laurine Blazer on: 11/27/2019 05:49 PM   Modules accepted: Orders

## 2019-11-27 NOTE — Progress Notes (Signed)
Cardiology Office Note  Date: 11/27/2019   ID: Ermalinda, Joubert Mar 21, 1945, MRN [id number]  PCP:  Celene Squibb, MD  Cardiologist:  Rozann Lesches, MD Electrophysiologist:  Thompson Grayer, MD   Chief Complaint  Patient presents with  . Cardiac follow-up    History of Present Illness: Abbigayle Toole Rabadan is a 74 y.o. female last seen in May by Ms. Strader PA-C.  She presents for a follow-up visit.  She does not report any progressive sense of palpitations, generally fatigued overall.  She does have caregiver stress, her husband has ALS.  At this point she has not found an assistant to replace the person that was helping her several months ago.  I personally reviewed her ECG today which shows rate controlled atrial fibrillation in the 90s on current therapy.  I went over her medications which are listed below.  She does not report any bleeding problems and is due for follow-up lab work.  She tells me that her legs have been swelling more significantly in the last few weeks despite continued use of Demadex with potassium supplement.  Her weight is up about 6 pounds.  Last echocardiogram was in February 2020 at which point LVEF was 60 to 65%.  Past Medical History:  Diagnosis Date  . Anxiety   . Atrial fibrillation (HCC)    Atrial fibrillation ablation in 2019 - Dr. Rayann Heman  . Chronic diastolic heart failure ([place])   . Depression   . Essential hypertension   . History of TIA (transient ischemic attack)   . Hyperlipidemia   . Hypothyroidism   . Pneumonia     Past Surgical History:  Procedure Laterality Date  . ABDOMINAL HYSTERECTOMY    . ABLATION OF DYSRHYTHMIC FOCUS  07/06/2017  . ATRIAL FIBRILLATION ABLATION N/A 07/06/2017   Procedure: ATRIAL FIBRILLATION ABLATION;  Surgeon: Thompson Grayer, MD;  Location: [place] [facility];  Service: Cardiovascular;  Laterality: N/A;  . BREAST SURGERY     biopsy  . CARDIOVERSION N/A 01/23/2016   Procedure: CARDIOVERSION;  Surgeon: Satira Sark, MD;  [facility];  Service: Cardiovascular;  Laterality: N/A;  . CARDIOVERSION N/A 11/13/2016   Procedure: CARDIOVERSION;  Surgeon: Arnoldo Lenis, MD;  Location: [facility];  Service: Endoscopy;  Laterality: N/A;  . CARDIOVERSION N/A 04/20/2018   Procedure: CARDIOVERSION;  Surgeon: [name], MD;  Location: [facility] ENDOSCOPY;  Service: Cardiovascular;  Laterality: N/A;  . CHOLECYSTECTOMY    . COLONOSCOPY N/A 10/07/2018   Sigmoid and descending colon diverticulosis, three 6 to 15 mm polyps in rectum, ascending colon, and IC valve, one 6 mm polyp in cecum. (sessile serrated polyps and sessile serrated adenoma of ascending colon with focal high grade dysplasia.  . coloscopy    . ESOPHAGEAL DILATION  10/07/2018   Procedure: ESOPHAGEAL DILATION;  Surgeon: Daneil Dolin, MD;  Location: [facility];  Service: Endoscopy;;  . ESOPHAGOGASTRODUODENOSCOPY N/A 10/07/2018   normal esophagus s/p dilation  . POLYPECTOMY  10/07/2018   Procedure: POLYPECTOMY;  Surgeon: Daneil Dolin, MD;  [facility]: [facility];  Service: Endoscopy;;  Cecal polyps x 2 hot snare Ascending polyp x 1 Hot snare Rectal polyp x 1  . TEE WITHOUT CARDIOVERSION  07/06/2017  . TEE WITHOUT CARDIOVERSION N/A 07/06/2017   Procedure: TRANSESOPHAGEAL ECHOCARDIOGRAM (TEE);  Surgeon: Josue Hector, MD;  Location: [facility] ENDOSCOPY;  Service: Cardiovascular;  Laterality: N/A;    Current Outpatient Medications  Medication Sig Dispense Refill  .  acetaminophen (TYLENOL) 650 MG CR tablet Take 1,300 mg every 8 (eight) hours as needed by mouth for pain.    Marland Kitchen diltiazem (CARDIZEM CD) 300 MG 24 hr capsule Take 1 capsule (300 mg total) by mouth daily. 30 capsule 5  . diltiazem (CARDIZEM) 30 MG tablet Take 1 tablet (30 mg total) by mouth 4 (four) times daily as needed (for atrial fibrillation). 60 tablet 3  . DULoxetine (CYMBALTA) 60 MG capsule Take 1 capsule (60 mg total) by mouth daily. 30 capsule 1  . levothyroxine  (SYNTHROID, LEVOTHROID) 137 MCG tablet Take 137 mcg by mouth daily before breakfast.     . lisinopril (ZESTRIL) 10 MG tablet Take 1 tablet (10 mg total) by mouth daily. 90 tablet 3  . magnesium oxide (MAG-OX) 400 MG tablet Take 1 tablet (400 mg total) by mouth daily. 20 tablet 0  . metoprolol succinate (TOPROL-XL) 100 MG 24 hr tablet Take 100 mg by mouth daily.    . pantoprazole (PROTONIX) 40 MG tablet TAKE 1 TABLET BY MOUTH DAILY 30 MINUTES BEFORE BREAKFAST 30 tablet 5  . potassium chloride SA (K-DUR,KLOR-CON) 20 MEQ tablet Take 2 tablets (40 mEq total) by mouth daily. 180 tablet 3  . rivaroxaban (XARELTO) 20 MG TABS tablet TAKE 1 TABLET BY MOUTH EVERY DAY WITH DINNER 30 tablet 11  . torsemide (DEMADEX) 20 MG tablet Take 3 tablets (60 mg total) by mouth every morning. 90 tablet 3   No current facility-administered medications for this visit.   Allergies:  Levaquin [levofloxacin] and Sulfa antibiotics   ROS:   No syncope.  Physical Exam: VS:  BP 110/84   Pulse 91   Ht 5\' 1"  (1.549 m)   Wt 176 lb (79.8 kg)   SpO2 93%   BMI 33.25 kg/m , BMI Body mass index is 33.25 kg/m.  Wt Readings from Last 3 Encounters:  11/27/19 176 lb (79.8 kg)  07/12/19 170 lb 9.6 oz (77.4 kg)  06/28/19 168 lb (76.2 kg)    General: Patient appears comfortable at rest. HEENT: Conjunctiva and lids normal, wearing a mask. Neck: Supple, no elevated JVP or carotid bruits, no thyromegaly. Lungs: Clear to auscultation, nonlabored breathing at rest. Cardiac: Irregularly irregular, no S3 or significant systolic murmur, no pericardial rub. Extremities: Lymphedema and worsening leg edema noted bilaterally.  ECG:  An ECG dated 06/28/2019 was personally reviewed today and demonstrated:  Rate controlled atrial fibrillation.  Recent Labwork: 06/23/2019: ALT 22; AST 29; B Natriuretic Peptide 337.0; Magnesium 2.3; TSH 0.520 06/24/2019: BUN 20; Creatinine, Ser 1.11; Hemoglobin 10.9; Platelets 197; Potassium 3.5; Sodium 139     Other Studies Reviewed Today:  Echocardiogram 04/21/2018: 1. The left ventricle has normal systolic function with an ejection  fraction of 60-65%. The cavity size was normal. There is mildly increased  left ventricular wall thickness. Left ventricular diastolic Doppler  parameters are consistent with  pseudonormalization Elevated mean left atrial pressure.  2. The right ventricle has normal systolic function. The cavity was  mildly enlarged. There is no increase in right ventricular wall thickness.  3. Left atrial size was mildly dilated.  4. The mitral valve is normal in structure. There is mild thickening. No  evidence of mitral valve stenosis.  5. The tricuspid valve is normal in structure.  6. The aortic valve is tricuspid There is mild thickening of the aortic  valve.  7. The aortic root is normal in size and structure.  8. Right atrial pressure is estimated at 3 mmHg.  Assessment and Plan:  1.  Persistent atrial fibrillation.  CHA2DS2-VASc score is 6.  She remains in atrial fibrillation today, heart rate controlled on combination of Cardizem CD and Toprol-XL, continues on Xarelto for stroke prophylaxis.  She complains of fatigue, not so much palpitations.  We have discussed options of management for permanent atrial fibrillation versus referral back to the atrial fibrillation clinic and discussion of antiarrhythmic therapy and repeat cardioversion (has a history of A. fib ablation).  She reports worsening leg edema.  Our plan is to obtain a follow-up echocardiogram to reassess LVEF, also increase Demadex to 60 mg daily.  If she has had a decline in cardiac function this would be further impetus to pursue strategy of rhythm control.  She is still considering her options for now.  Follow-up CBC and BMET with clinical reassessment in the [place] office.  2.  Chronic diastolic heart failure, as noted above diuretics are being increased and a follow-up echocardiogram will be  obtained.  3.  Hypothyroidism on Synthroid with follow-up by Dr. Nevada Crane.  Last LDL was 0.52 in April.   Medication Adjustments/Labs and Tests Ordered: Current medicines are reviewed at length with the patient today.  Concerns regarding medicines are outlined above.   Tests Ordered: Orders Placed This Encounter  Procedures  . Basic Metabolic Panel (BMET)  . CBC  . ECHOCARDIOGRAM COMPLETE    Medication Changes: Meds ordered this encounter  Medications  . torsemide (DEMADEX) 20 MG tablet    Sig: Take 3 tablets (60 mg total) by mouth every morning.    Dispense:  90 tablet    Refill:  3    DOSE INCREASE 11/27/2019    Disposition:  Follow up in the next few weeks in the [name] office.  Signed, Satira Sark, MD, [facility] 11/27/2019 3:29 PM    [employer] [facility] [place], [place],  [address] Phone: [phone]; Fax: [phone]

## 2019-12-01 ENCOUNTER — Other Ambulatory Visit (HOSPITAL_COMMUNITY)
Admission: RE | Admit: 2019-12-01 | Discharge: 2019-12-01 | Disposition: A | Discharge status: Home / Self Care | Payer: Medicare HMO | Source: Ambulatory Visit | Attending: Cardiology | Admitting: Cardiology

## 2019-12-01 ENCOUNTER — Ambulatory Visit (HOSPITAL_COMMUNITY)
Admission: RE | Admit: 2019-12-01 | Discharge: 2019-12-01 | Disposition: A | Discharge status: Home / Self Care | Payer: Medicare HMO | Source: Ambulatory Visit | Attending: Cardiology | Admitting: Cardiology

## 2019-12-01 ENCOUNTER — Other Ambulatory Visit: Payer: Self-pay

## 2019-12-01 ENCOUNTER — Telehealth: Payer: Self-pay | Admitting: *Deleted

## 2019-12-01 DIAGNOSIS — I10 Essential (primary) hypertension: Secondary | ICD-10-CM

## 2019-12-01 DIAGNOSIS — I5032 Chronic diastolic (congestive) heart failure: Secondary | ICD-10-CM

## 2019-12-01 LAB — CBC
HCT: 42.7 % (ref 36.0–46.0)
Hemoglobin: 13.4 g/dL (ref 12.0–15.0)
MCH: 30.7 pg (ref 26.0–34.0)
MCHC: 31.4 g/dL (ref 30.0–36.0)
MCV: 97.9 fL (ref 80.0–100.0)
Platelets: 287 10*3/uL (ref 150–400)
RBC: 4.36 MIL/uL (ref 3.87–5.11)
RDW: 14.6 % (ref 11.5–15.5)
WBC: 5.6 10*3/uL (ref 4.0–10.5)
nRBC: 0 % (ref 0.0–0.2)

## 2019-12-01 LAB — BASIC METABOLIC PANEL
Anion gap: 11 (ref 5–15)
BUN: 21 mg/dL (ref 8–23)
CO2: 29 mmol/L (ref 22–32)
Calcium: 10.5 mg/dL — ABNORMAL HIGH (ref 8.9–10.3)
Chloride: 99 mmol/L (ref 98–111)
Creatinine, Ser: 1.28 mg/dL — ABNORMAL HIGH (ref 0.44–1.00)
GFR calc Af Amer: 48 mL/min — ABNORMAL LOW (ref >60)
GFR calc non Af Amer: 41 mL/min — ABNORMAL LOW (ref >60)
Glucose, Bld: 94 mg/dL (ref 70–99)
Potassium: 3.4 mmol/L — ABNORMAL LOW (ref 3.5–5.1)
Sodium: 139 mmol/L (ref 135–145)

## 2019-12-01 LAB — ECHOCARDIOGRAM COMPLETE
AR max vel: 2.07 cm2
AV Area VTI: 1.89 cm2
AV Area mean vel: 1.78 cm2
AV Mean grad: 2.2 mmHg
AV Peak grad: 3.6 mmHg
Ao pk vel: 0.95 m/s
Area-P 1/2: 4.1 cm2
MV M vel: 4.97 m/s
MV Peak grad: 98.8 mmHg
S' Lateral: 2.96 cm

## 2019-12-01 NOTE — Telephone Encounter (Signed)
-----   Message from Satira Sark, MD sent at 12/01/2019  3:38 PM EDT ----- Results reviewed.  Hemoglobin normal at 13.4

## 2019-12-01 NOTE — Telephone Encounter (Signed)
-----   Message from Satira Sark, MD sent at 12/01/2019  4:03 PM EDT ----- Results reviewed.  Potassium is mildly low at 3.4 and creatinine 1.28.  We just increased her Demadex.  Please uptitrate KCl to 40 mEq in the morning and 20 mEq in the afternoon (she was previously on 20 mEq twice daily).  Check BMET in 2 weeks.

## 2019-12-01 NOTE — Telephone Encounter (Signed)
-----   Message from Satira Sark, MD sent at 12/01/2019  4:23 PM EDT ----- Results reviewed.  Please let her know that cardiac function remains normal, LVEF 55 to 60%.  Left atrium moderately dilated.  Also moderately elevated pulmonary pressures with mildly dilated right ventricle.  Continue with current plan, diuretics were just increased.

## 2019-12-01 NOTE — Progress Notes (Signed)
*  PRELIMINARY RESULTS* Echocardiogram 2D Echocardiogram has been performed.  Leavy Cella 12/01/2019, 3:56 PM

## 2019-12-04 MED ORDER — POTASSIUM CHLORIDE CRYS ER 20 MEQ PO TBCR
40.0000 meq | EXTENDED_RELEASE_TABLET | Freq: Every morning | ORAL | 3 refills | Status: DC
Start: 2019-12-04 — End: 2020-10-24

## 2019-12-04 NOTE — Telephone Encounter (Signed)
Patient informed and verbalized understanding of plan. Patient request not to send K+ prescription d/t having plenty on hand. Lab order faxed to [facility]. Copy sent to PCP

## 2019-12-04 NOTE — Telephone Encounter (Signed)
Patient returned call

## 2019-12-17 NOTE — Progress Notes (Deleted)
Cardiology Office Note  Date: 12/17/2019   ID: Cohen, Diane 06-20-1945, MRN [id number]  PCP:  Celene Squibb, MD  Cardiologist:  Rozann Lesches, MD Electrophysiologist:  Thompson Grayer, MD   Chief Complaint: Cardiac follow up  History of Present Illness: Diane Cohen is a 74 y.o. female with a history of persistent atrial fibrillation, chronic diastolic heart failure, hypothyroidism, HTN, HLD.  Last encounter with Dr. Domenic Polite 11/27/2019.  Had no complaints of progressive palpitations.  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  Her EKG showed rate controlled atrial fibrillation.  Medications were reviewed with the patient.  She did not report any bleeding.  She complained of increased leg swelling more significantly in the last few weeks in spite of continued use of torsemide with potassium.  Her weight was up about 6 pounds.  A follow-up echocardiogram was ordered.  Her torsemide was increased to 60 mg daily.  Dr. Domenic Polite mentioned if she had a decline in cardiac function would be further impetus to pursue a strategy of rhythm control.  He discussed antiarrhythmic therapy and repeat cardioversion.  Patient was continuing to consider her options.  Follow-up CBC and BMP were ordered.   Past Medical History:  Diagnosis Date  . Anxiety   . Atrial fibrillation (HCC)    Atrial fibrillation ablation in 2019 - Dr. Rayann Heman  . Chronic diastolic heart failure ([place])   . Depression   . Essential hypertension   . History of TIA (transient ischemic attack)   . Hyperlipidemia   . Hypothyroidism   . Pneumonia     Past Surgical History:  Procedure Laterality Date  . ABDOMINAL HYSTERECTOMY    . ABLATION OF DYSRHYTHMIC FOCUS  07/06/2017  . ATRIAL FIBRILLATION ABLATION N/A 07/06/2017   Procedure: ATRIAL FIBRILLATION ABLATION;  Surgeon: Thompson Grayer, MD;  Location: [facility];  Service: Cardiovascular;  Laterality: N/A;  .  BREAST SURGERY     biopsy  . CARDIOVERSION N/A 01/23/2016   Procedure: CARDIOVERSION;  Surgeon: Satira Sark, MD;  [facility];  Service: Cardiovascular;  Laterality: N/A;  . CARDIOVERSION N/A 11/13/2016   Procedure: CARDIOVERSION;  Surgeon: Arnoldo Lenis, MD;  [facility];  Service: Endoscopy;  Laterality: N/A;  . CARDIOVERSION N/A 04/20/2018   Procedure: CARDIOVERSION;  Surgeon: [name], MD;  Location: [facility] ENDOSCOPY;  Service: Cardiovascular;  Laterality: N/A;  . CHOLECYSTECTOMY    . COLONOSCOPY N/A 10/07/2018   Sigmoid and descending colon diverticulosis, three 6 to 15 mm polyps in rectum, ascending colon, and IC valve, one 6 mm polyp in cecum. (sessile serrated polyps and sessile serrated adenoma of ascending colon with focal high grade dysplasia.  . coloscopy    . ESOPHAGEAL DILATION  10/07/2018   Procedure: ESOPHAGEAL DILATION;  Surgeon: Daneil Dolin, MD;  [facility];  Service: Endoscopy;;  . ESOPHAGOGASTRODUODENOSCOPY N/A 10/07/2018   normal esophagus s/p dilation  . POLYPECTOMY  10/07/2018   Procedure: POLYPECTOMY;  Surgeon: Daneil Dolin, MD;  [facility];  Service: Endoscopy;;  Cecal polyps x 2 hot snare Ascending polyp x 1 Hot snare Rectal polyp x 1  . TEE WITHOUT CARDIOVERSION  07/06/2017  . TEE WITHOUT CARDIOVERSION N/A 07/06/2017   Procedure: TRANSESOPHAGEAL ECHOCARDIOGRAM (TEE);  Surgeon: Josue Hector, MD;  Location: [facility] ENDOSCOPY;  Service: Cardiovascular;  Laterality: N/A;    Current Outpatient Medications  Medication Sig  Dispense Refill  . acetaminophen (TYLENOL) 650 MG CR tablet Take 1,300 mg every 8 (eight) hours as needed by mouth for pain.    Marland Kitchen diltiazem (CARDIZEM CD) 300 MG 24 hr capsule Take 1 capsule (300 mg total) by mouth daily. 30 capsule 5  . diltiazem (CARDIZEM) 30 MG tablet Take 1 tablet (30 mg total) by mouth 4 (four) times daily as needed (for atrial fibrillation). 60 tablet 3  .  DULoxetine (CYMBALTA) 60 MG capsule Take 1 capsule (60 mg total) by mouth daily. 30 capsule 1  . levothyroxine (SYNTHROID, LEVOTHROID) 137 MCG tablet Take 137 mcg by mouth daily before breakfast.     . lisinopril (ZESTRIL) 10 MG tablet Take 1 tablet (10 mg total) by mouth daily. 90 tablet 3  . magnesium oxide (MAG-OX) 400 MG tablet Take 1 tablet (400 mg total) by mouth daily. 20 tablet 0  . metoprolol succinate (TOPROL-XL) 100 MG 24 hr tablet Take 100 mg by mouth daily.    . pantoprazole (PROTONIX) 40 MG tablet TAKE 1 TABLET BY MOUTH DAILY 30 MINUTES BEFORE BREAKFAST 30 tablet 5  . potassium chloride SA (KLOR-CON) 20 MEQ tablet Take 2 tablets (40 mEq total) by mouth in the morning. & 1 tablet (20 meq total) by mouth in the evening 270 tablet 3  . rivaroxaban (XARELTO) 20 MG TABS tablet TAKE 1 TABLET BY MOUTH EVERY DAY WITH DINNER 30 tablet 11  . torsemide (DEMADEX) 20 MG tablet Take 3 tablets (60 mg total) by mouth every morning. 90 tablet 3   No current facility-administered medications for this visit.   Allergies:  Levaquin [levofloxacin] and Sulfa antibiotics   Social History: The patient  reports that she quit smoking about 46 years ago. Her smoking use included cigarettes. She smoked 0.50 packs per day. She has never used smokeless tobacco. She reports previous alcohol use. She reports that she does not use drugs.   Family History: The patient's family history includes Anemia in her sister; Hypertension in her father, mother, sister, and another family member.   ROS:  Please see the history of present illness. Otherwise, complete review of systems is positive for {NONE DEFAULTED:18576::"none"}.  All other systems are reviewed and negative.   Physical Exam: VS:  There were no vitals taken for this visit., BMI There is no height or weight on file to calculate BMI.  Wt Readings from Last 3 Encounters:  11/27/19 176 lb (79.8 kg)  07/12/19 170 lb 9.6 oz (77.4 kg)  06/28/19 168 lb (76.2 kg)      General: Patient appears comfortable at rest. HEENT: Conjunctiva and lids normal, oropharynx clear with moist mucosa. Neck: Supple, no elevated JVP or carotid bruits, no thyromegaly. Lungs: Clear to auscultation, nonlabored breathing at rest. Cardiac: Regular rate and rhythm, no S3 or significant systolic murmur, no pericardial rub. Abdomen: Soft, nontender, no hepatomegaly, bowel sounds present, no guarding or rebound. Extremities: No pitting edema, distal pulses 2+. Skin: Warm and dry. Musculoskeletal: No kyphosis. Neuropsychiatric: Alert and oriented x3, affect grossly appropriate.  ECG:  {EKG/Telemetry Strips Reviewed:[phone]}  Recent Labwork: 06/23/2019: ALT 22; AST 29; B Natriuretic Peptide 337.0; Magnesium 2.3; TSH 0.520 12/01/2019: BUN 21; Creatinine, Ser 1.28; Hemoglobin 13.4; Platelets 287; Potassium 3.4; Sodium 139  No results found for: CHOL, TRIG, HDL, CHOLHDL, VLDL, LDLCALC, LDLDIRECT  Other Studies Reviewed Today:     Echocardiogram 04/21/2018: 1. The left ventricle has normal systolic function with an ejection  fraction of 60-65%. The cavity size was normal. There  is mildly increased  left ventricular wall thickness. Left ventricular diastolic Doppler  parameters are consistent with  pseudonormalization Elevated mean left atrial pressure.  2. The right ventricle has normal systolic function. The cavity was  mildly enlarged. There is no increase in right ventricular wall thickness.  3. Left atrial size was mildly dilated.  4. The mitral valve is normal in structure. There is mild thickening. No  evidence of mitral valve stenosis.  5. The tricuspid valve is normal in structure.  6. The aortic valve is tricuspid There is mild thickening of the aortic  valve.  7. The aortic root is normal in size and structure.  8. Right atrial pressure is estimated at 3 mmHg.  Assessment and Plan:  1. Persistent atrial fibrillation   2. Chronic diastolic heart  failure ([place])   3. Acquired hypothyroidism      Medication Adjustments/Labs and Tests Ordered: Current medicines are reviewed at length with the patient today.  Concerns regarding medicines are outlined above.   Disposition: Follow-up with ***  Signed, Levell July, NP 12/17/2019 9:11 PM    [place] at [facility], [place], [place] [address] Phone: [phone]; Fax: [phone]   Cardiology Office Note  Date: 12/17/2019   ID: Aylin, Rhoads 10/20/1945, MRN [id number]  PCP:  Celene Squibb, MD  Cardiologist:  Rozann Lesches, MD Electrophysiologist:  Thompson Grayer, MD   Chief Complaint: Cardiac follow up  History of Present Illness: Diane Cohen is a 74 y.o. female with a history of persistent atrial fibrillation, chronic diastolic heart failure, hypothyroidism.  Last encounter with Dr. Domenic Polite 11/27/2019: She was feeling generally fatigued overall and having caregiver stress secondary to taking care of her husband has ALS.  She did not report any palpitations.  She did not report any bleeding problems and was due for follow-up lab work.  Stated her legs have been swelling more significantly in the last few months despite continued use of torsemide with potassium supplement.  Her weight was up about 6 pounds.  Last echo in February 2020 LVEF was 60 to 65%.  She remains in atrial fibrillation but heart rate control on combination Cardizem CD and Toprol-XL.  She was continued Xarelto for stroke prophylaxis.  Plan was to obtain a follow-up echocardiogram to reassess LV function.  Torsemide was increased to 60 mg a day.  There is marked decline in cardiac function would be a further impetus to pursue a strategy of rhythm control for atrial fibrillation.  She was still considering her options.  Follow-up CBC and BMP was ordered for clinical reassessment [place].  Past Medical History:  Diagnosis Date  . Anxiety   . Atrial fibrillation (HCC)      Atrial fibrillation ablation in 2019 - Dr. Rayann Heman  . Chronic diastolic heart failure ([place])   . Depression   . Essential hypertension   . History of TIA (transient ischemic attack)   . Hyperlipidemia   . Hypothyroidism   . Pneumonia     Past Surgical History:  Procedure Laterality Date  . ABDOMINAL HYSTERECTOMY    . ABLATION OF DYSRHYTHMIC FOCUS  07/06/2017  . ATRIAL FIBRILLATION ABLATION N/A 07/06/2017   Procedure: ATRIAL FIBRILLATION ABLATION;  Surgeon: Thompson Grayer, MD;  Location: [facility];  Service: Cardiovascular;  Laterality: N/A;  . BREAST SURGERY     biopsy  . CARDIOVERSION N/A 01/23/2016   Procedure: CARDIOVERSION;  Surgeon: Satira Sark, MD;  [facility];  Service: Cardiovascular;  Laterality: N/A;  . CARDIOVERSION N/A 11/13/2016   Procedure: CARDIOVERSION;  Surgeon: Arnoldo Lenis, MD;  [facility];  Service: Endoscopy;  Laterality: N/A;  . CARDIOVERSION N/A 04/20/2018   Procedure: CARDIOVERSION;  Surgeon: [name], MD;  Location: [facility] ENDOSCOPY;  Service: Cardiovascular;  Laterality: N/A;  . CHOLECYSTECTOMY    . COLONOSCOPY N/A 10/07/2018   Sigmoid and descending colon diverticulosis, three 6 to 15 mm polyps in rectum, ascending colon, and IC valve, one 6 mm polyp in cecum. (sessile serrated polyps and sessile serrated adenoma of ascending colon with focal high grade dysplasia.  . coloscopy    . ESOPHAGEAL DILATION  10/07/2018   Procedure: ESOPHAGEAL DILATION;  Surgeon: Daneil Dolin, MD;  [facility];  Service: Endoscopy;;  . ESOPHAGOGASTRODUODENOSCOPY N/A 10/07/2018   normal esophagus s/p dilation  . POLYPECTOMY  10/07/2018   Procedure: POLYPECTOMY;  Surgeon: Daneil Dolin, MD;  [facility];  Service: Endoscopy;;  Cecal polyps x 2 hot snare Ascending polyp x 1 Hot snare Rectal polyp x 1  . TEE WITHOUT CARDIOVERSION  07/06/2017  . TEE WITHOUT CARDIOVERSION N/A 07/06/2017   Procedure:  TRANSESOPHAGEAL ECHOCARDIOGRAM (TEE);  Surgeon: Josue Hector, MD;  Location: [facility] ENDOSCOPY;  Service: Cardiovascular;  Laterality: N/A;    Current Outpatient Medications  Medication Sig Dispense Refill  . acetaminophen (TYLENOL) 650 MG CR tablet Take 1,300 mg every 8 (eight) hours as needed by mouth for pain.    Marland Kitchen diltiazem (CARDIZEM CD) 300 MG 24 hr capsule Take 1 capsule (300 mg total) by mouth daily. 30 capsule 5  . diltiazem (CARDIZEM) 30 MG tablet Take 1 tablet (30 mg total) by mouth 4 (four) times daily as needed (for atrial fibrillation). 60 tablet 3  . DULoxetine (CYMBALTA) 60 MG capsule Take 1 capsule (60 mg total) by mouth daily. 30 capsule 1  . levothyroxine (SYNTHROID, LEVOTHROID) 137 MCG tablet Take 137 mcg by mouth daily before breakfast.     . lisinopril (ZESTRIL) 10 MG tablet Take 1 tablet (10 mg total) by mouth daily. 90 tablet 3  . magnesium oxide (MAG-OX) 400 MG tablet Take 1 tablet (400 mg total) by mouth daily. 20 tablet 0  . metoprolol succinate (TOPROL-XL) 100 MG 24 hr tablet Take 100 mg by mouth daily.    . pantoprazole (PROTONIX) 40 MG tablet TAKE 1 TABLET BY MOUTH DAILY 30 MINUTES BEFORE BREAKFAST 30 tablet 5  . potassium chloride SA (KLOR-CON) 20 MEQ tablet Take 2 tablets (40 mEq total) by mouth in the morning. & 1 tablet (20 meq total) by mouth in the evening 270 tablet 3  . rivaroxaban (XARELTO) 20 MG TABS tablet TAKE 1 TABLET BY MOUTH EVERY DAY WITH DINNER 30 tablet 11  . torsemide (DEMADEX) 20 MG tablet Take 3 tablets (60 mg total) by mouth every morning. 90 tablet 3   No current facility-administered medications for this visit.   Allergies:  Levaquin [levofloxacin] and Sulfa antibiotics   Social History: The patient  reports that she quit smoking about 46 years ago. Her smoking use included cigarettes. She smoked 0.50 packs per day. She has never used smokeless tobacco. She reports previous alcohol use. She reports that she does not use drugs.   Family  History: The patient's family history includes Anemia in her sister; Hypertension in her father, mother, sister, and another family member.   ROS:  Please see the history of present illness. Otherwise,  complete review of systems is positive for {NONE DEFAULTED:18576::"none"}.  All other systems are reviewed and negative.   Physical Exam: VS:  There were no vitals taken for this visit., BMI There is no height or weight on file to calculate BMI.  Wt Readings from Last 3 Encounters:  11/27/19 176 lb (79.8 kg)  07/12/19 170 lb 9.6 oz (77.4 kg)  06/28/19 168 lb (76.2 kg)    General: Patient appears comfortable at rest. HEENT: Conjunctiva and lids normal, oropharynx clear with moist mucosa. Neck: Supple, no elevated JVP or carotid bruits, no thyromegaly. Lungs: Clear to auscultation, nonlabored breathing at rest. Cardiac: Regular rate and rhythm, no S3 or significant systolic murmur, no pericardial rub. Abdomen: Soft, nontender, no hepatomegaly, bowel sounds present, no guarding or rebound. Extremities: No pitting edema, distal pulses 2+. Skin: Warm and dry. Musculoskeletal: No kyphosis. Neuropsychiatric: Alert and oriented x3, affect grossly appropriate.  ECG:  {EKG/Telemetry Strips [phone]}  Recent Labwork: 06/23/2019: ALT 22; AST 29; B Natriuretic Peptide 337.0; Magnesium 2.3; TSH 0.520 12/01/2019: BUN 21; Creatinine, Ser 1.28; Hemoglobin 13.4; Platelets 287; Potassium 3.4; Sodium 139  No results found for: CHOL, TRIG, HDL, CHOLHDL, VLDL, LDLCALC, LDLDIRECT  Other Studies Reviewed Today:  Echocardiogram 12/01/2019 1. Left ventricular ejection fraction, by estimation, is 55 to 60%. The left ventricle has normal function. The left ventricle has no regional wall motion abnormalities. Left ventricular diastolic parameters are indeterminate. 2. Right ventricular systolic function is normal. The right ventricular size is mildly enlarged. 3. Left atrial size was moderately  dilated. 4. Right atrial size was moderately dilated. 5. The mitral valve is normal in structure. Mild mitral valve regurgitation. No evidence of mitral stenosis. 6. Tricuspid valve regurgitation is moderate. 7. The aortic valve is tricuspid. Aortic valve regurgitation is not visualized. No aortic stenosis is present. 8. Moderate pulmonary HTN, PASP is 51 mmHg. 9. The inferior vena cava is normal in size with greater than 50% respiratory variability, suggesting right atrial pressure of 3 mmHg.  CTA chest 06/23/2019 IMPRESSION: 1. No demonstrable pulmonary embolus. No thoracic aortic aneurysm or dissection. There is aortic atherosclerosis. There are foci of coronary artery calcification. 2. Underlying centrilobular emphysematous change. Areas of mild atelectasis. No edema or airspace opacity. Lobular septal thickening in the bases likely represents chronic inflammation. No overt pulmonary edema. No pleural effusions. 3. Prominent precarinal lymph node, smaller than on previous study. No new adenopathy. 4.  Absent gallbladder.  Aortic Atherosclerosis (ICD10-I70.0) and Emphysema (ICD10-J43.9).   Echocardiogram 04/21/2018: 1. The left ventricle has normal systolic function with an ejection  fraction of 60-65%. The cavity size was normal. There is mildly increased  left ventricular wall thickness. Left ventricular diastolic Doppler  parameters are consistent with  pseudonormalization Elevated mean left atrial pressure.  2. The right ventricle has normal systolic function. The cavity was  mildly enlarged. There is no increase in right ventricular wall thickness.  3. Left atrial size was mildly dilated.  4. The mitral valve is normal in structure. There is mild thickening. No  evidence of mitral valve stenosis.  5. The tricuspid valve is normal in structure.  6. The aortic valve is tricuspid There is mild thickening of the aortic  valve.  7. The aortic root is normal in  size and structure.  8. Right atrial pressure is estimated at 3 mmHg.   Assessment and Plan:  1. Persistent atrial fibrillation   2. Chronic diastolic heart failure ([place])   3. Acquired hypothyroidism    1. Persistent  atrial fibrillation ***  2. Chronic diastolic heart failure (HCC) ***  3. Acquired hypothyroidism ***  Medication Adjustments/Labs and Tests Ordered: Current medicines are reviewed at length with the patient today.  Concerns regarding medicines are outlined above.   Disposition: Follow-up with ***  Signed, Levell July, NP 12/17/2019 9:11 PM    [place] at [facility] [place], [place], [place] [address] Phone: [phone]; Fax: [phone]

## 2019-12-18 ENCOUNTER — Ambulatory Visit: Payer: Medicare HMO | Admitting: Family Medicine

## 2019-12-18 DIAGNOSIS — I5032 Chronic diastolic (congestive) heart failure: Secondary | ICD-10-CM

## 2019-12-18 DIAGNOSIS — I4819 Other persistent atrial fibrillation: Secondary | ICD-10-CM

## 2019-12-18 DIAGNOSIS — E039 Hypothyroidism, unspecified: Secondary | ICD-10-CM

## 2020-01-08 NOTE — Progress Notes (Deleted)
Cardiology Office Note  Date: 01/08/2020   ID: [name], Knock 01-07-1946, MRN [id number]  PCP:  Celene Squibb, MD  Cardiologist:  Rozann Lesches, MD Electrophysiologist:  Thompson Grayer, MD   Chief Complaint: Cardiac follow up  History of Present Illness: Diane Cohen is a 74 y.o. female with a history of persistent atrial fibrillation, chronic diastolic heart failure, hypothyroidism, HTN, HLD.  Last encounter with Dr. Domenic Polite 11/27/2019.  Had no complaints of progressive palpitations.  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  Her EKG showed rate controlled atrial fibrillation.  Medications were reviewed with the patient.  She did not report any bleeding.  She complained of increased leg swelling more significantly in the prior few weeks in spite of continued use of torsemide with potassium.  Her weight was up about 6 pounds.  A follow-up echocardiogram was ordered.  Her torsemide was increased to 60 mg daily.  Dr. Domenic Polite mentioned if she had a decline in cardiac function would be further impetus to pursue a strategy of rhythm control.  He discussed antiarrhythmic therapy and repeat cardioversion.  Patient was continuing to consider her options.  Follow-up CBC and BMP were ordered.   Past Medical History:  Diagnosis Date  . Anxiety   . Atrial fibrillation (HCC)    Atrial fibrillation ablation in 2019 - Dr. Rayann Heman  . Chronic diastolic heart failure ([place])   . Depression   . Essential hypertension   . History of TIA (transient ischemic attack)   . Hyperlipidemia   . Hypothyroidism   . Pneumonia     Past Surgical History:  Procedure Laterality Date  . ABDOMINAL HYSTERECTOMY    . ABLATION OF DYSRHYTHMIC FOCUS  07/06/2017  . ATRIAL FIBRILLATION ABLATION N/A 07/06/2017   Procedure: ATRIAL FIBRILLATION ABLATION;  Surgeon: Thompson Grayer, MD;  Location: [facility];  Service: Cardiovascular;  Laterality: N/A;  .  BREAST SURGERY     biopsy  . CARDIOVERSION N/A 01/23/2016   Procedure: CARDIOVERSION;  Surgeon: Satira Sark, MD;  [facility];  Service: Cardiovascular;  Laterality: N/A;  . CARDIOVERSION N/A 11/13/2016   Procedure: CARDIOVERSION;  Surgeon: Arnoldo Lenis, MD;  [facility];  Service: Endoscopy;  Laterality: N/A;  . CARDIOVERSION N/A 04/20/2018   Procedure: CARDIOVERSION;  Surgeon: [name], MD;  Location: [facility] ENDOSCOPY;  Service: Cardiovascular;  Laterality: N/A;  . CHOLECYSTECTOMY    . COLONOSCOPY N/A 10/07/2018   Sigmoid and descending colon diverticulosis, three 6 to 15 mm polyps in rectum, ascending colon, and IC valve, one 6 mm polyp in cecum. (sessile serrated polyps and sessile serrated adenoma of ascending colon with focal high grade dysplasia.  . coloscopy    . ESOPHAGEAL DILATION  10/07/2018   Procedure: ESOPHAGEAL DILATION;  Surgeon: Daneil Dolin, MD;  [facility];  Service: Endoscopy;;  . ESOPHAGOGASTRODUODENOSCOPY N/A 10/07/2018   normal esophagus s/p dilation  . POLYPECTOMY  10/07/2018   Procedure: POLYPECTOMY;  Surgeon: Daneil Dolin, MD;  [facility];  Service: Endoscopy;;  Cecal polyps x 2 hot snare Ascending polyp x 1 Hot snare Rectal polyp x 1  . TEE WITHOUT CARDIOVERSION  07/06/2017  . TEE WITHOUT CARDIOVERSION N/A 07/06/2017   Procedure: TRANSESOPHAGEAL ECHOCARDIOGRAM (TEE);  Surgeon: Josue Hector, MD;  Location: [facility] ENDOSCOPY;  Service: Cardiovascular;  Laterality: N/A;    Current Outpatient Medications  Medication Sig  Dispense Refill  . acetaminophen (TYLENOL) 650 MG CR tablet Take 1,300 mg every 8 (eight) hours as needed by mouth for pain.    Marland Kitchen diltiazem (CARDIZEM CD) 300 MG 24 hr capsule Take 1 capsule (300 mg total) by mouth daily. 30 capsule 5  . diltiazem (CARDIZEM) 30 MG tablet Take 1 tablet (30 mg total) by mouth 4 (four) times daily as needed (for atrial fibrillation). 60 tablet 3  .  DULoxetine (CYMBALTA) 60 MG capsule Take 1 capsule (60 mg total) by mouth daily. 30 capsule 1  . levothyroxine (SYNTHROID, LEVOTHROID) 137 MCG tablet Take 137 mcg by mouth daily before breakfast.     . lisinopril (ZESTRIL) 10 MG tablet Take 1 tablet (10 mg total) by mouth daily. 90 tablet 3  . magnesium oxide (MAG-OX) 400 MG tablet Take 1 tablet (400 mg total) by mouth daily. 20 tablet 0  . metoprolol succinate (TOPROL-XL) 100 MG 24 hr tablet Take 100 mg by mouth daily.    . pantoprazole (PROTONIX) 40 MG tablet TAKE 1 TABLET BY MOUTH DAILY 30 MINUTES BEFORE BREAKFAST 30 tablet 5  . potassium chloride SA (KLOR-CON) 20 MEQ tablet Take 2 tablets (40 mEq total) by mouth in the morning. & 1 tablet (20 meq total) by mouth in the evening 270 tablet 3  . rivaroxaban (XARELTO) 20 MG TABS tablet TAKE 1 TABLET BY MOUTH EVERY DAY WITH DINNER 30 tablet 11  . torsemide (DEMADEX) 20 MG tablet Take 3 tablets (60 mg total) by mouth every morning. 90 tablet 3   No current facility-administered medications for this visit.   Allergies:  Levaquin [levofloxacin] and Sulfa antibiotics   Social History: The patient  reports that she quit smoking about 46 years ago. Her smoking use included cigarettes. She smoked 0.50 packs per day. She has never used smokeless tobacco. She reports previous alcohol use. She reports that she does not use drugs.   Family History: The patient's family history includes Anemia in her sister; Hypertension in her father, mother, sister, and another family member.   ROS:  Please see the history of present illness. Otherwise, complete review of systems is positive for none.  All other systems are reviewed and negative.   Physical Exam: VS:  There were no vitals taken for this visit., BMI There is no height or weight on file to calculate BMI.  Wt Readings from Last 3 Encounters:  11/27/19 176 lb (79.8 kg)  07/12/19 170 lb 9.6 oz (77.4 kg)  06/28/19 168 lb (76.2 kg)    General: Patient  appears comfortable at rest. HEENT: Conjunctiva and lids normal, oropharynx clear with moist mucosa. Neck: Supple, no elevated JVP or carotid bruits, no thyromegaly. Lungs: Clear to auscultation, nonlabored breathing at rest. Cardiac: Regular rate and rhythm, no S3 or significant systolic murmur, no pericardial rub. Abdomen: Soft, nontender, no hepatomegaly, bowel sounds present, no guarding or rebound. Extremities: No pitting edema, distal pulses 2+. Skin: Warm and dry. Musculoskeletal: No kyphosis. Neuropsychiatric: Alert and oriented x3, affect grossly appropriate.  ECG:  {EKG/Telemetry Strips [phone]}  Recent Labwork: 06/23/2019: ALT 22; AST 29; B Natriuretic Peptide 337.0; Magnesium 2.3; TSH 0.520 12/01/2019: BUN 21; Creatinine, Ser 1.28; Hemoglobin 13.4; Platelets 287; Potassium 3.4; Sodium 139  No results found for: CHOL, TRIG, HDL, CHOLHDL, VLDL, LDLCALC, LDLDIRECT  Other Studies Reviewed Today:  Echocardiogram 12/01/2019 IMPRESSIONS  1. Left ventricular ejection fraction, by estimation, is 55 to 60%. The  left ventricle has normal function. The left ventricle has no regional  wall motion abnormalities. Left ventricular diastolic parameters are  indeterminate.  2. Right ventricular systolic function is normal. The right ventricular  size is mildly enlarged.  3. Left atrial size was moderately dilated.  4. Right atrial size was moderately dilated.  5. The mitral valve is normal in structure. Mild mitral valve  regurgitation. No evidence of mitral stenosis.  6. Tricuspid valve regurgitation is moderate.  7. The aortic valve is tricuspid. Aortic valve regurgitation is not  visualized. No aortic stenosis is present.  8. Moderate pulmonary HTN, PASP is 51 mmHg.  9. The inferior vena cava is normal in size with greater than 50%  respiratory variability, suggesting right atrial pressure of 3 mmHg.      Echocardiogram 04/21/2018: 1. The left ventricle has  normal systolic function with an ejection  fraction of 60-65%. The cavity size was normal. There is mildly increased  left ventricular wall thickness. Left ventricular diastolic Doppler  parameters are consistent with  pseudonormalization Elevated mean left atrial pressure.  2. The right ventricle has normal systolic function. The cavity was  mildly enlarged. There is no increase in right ventricular wall thickness.  3. Left atrial size was mildly dilated.  4. The mitral valve is normal in structure. There is mild thickening. No  evidence of mitral valve stenosis.  5. The tricuspid valve is normal in structure.  6. The aortic valve is tricuspid There is mild thickening of the aortic  valve.  7. The aortic root is normal in size and structure.  8. Right atrial pressure is estimated at 3 mmHg.  Assessment and Plan:  1. Persistent atrial fibrillation   2. Chronic diastolic heart failure ([place])   3. Essential hypertension, benign    1. Persistent atrial fibrillation ***  2. Chronic diastolic heart failure (HCC) ***  3. Essential hypertension, benign ***  Medication Adjustments/Labs and Tests Ordered: Current medicines are reviewed at length with the patient today.  Concerns regarding medicines are outlined above.   Disposition: Follow-up with ***  Signed, Levell July, NP 01/08/2020 4:34 PM    [facility] at [facility], [place], [place] [address]

## 2020-01-09 ENCOUNTER — Ambulatory Visit: Payer: Medicare HMO | Admitting: Family Medicine

## 2020-01-09 DIAGNOSIS — I4819 Other persistent atrial fibrillation: Secondary | ICD-10-CM

## 2020-01-09 DIAGNOSIS — I10 Essential (primary) hypertension: Secondary | ICD-10-CM

## 2020-01-09 DIAGNOSIS — I5032 Chronic diastolic (congestive) heart failure: Secondary | ICD-10-CM

## 2020-01-18 NOTE — Progress Notes (Signed)
Cardiology Office Note  Date: 01/19/2020   ID: Aine, Strycharz 1946/02/12, MRN [id number]  PCP:  Celene Squibb, MD  Cardiologist:  Rozann Lesches, MD Electrophysiologist:  Thompson Grayer, MD   Chief Complaint  Patient presents with  . Cardiac follow-up    History of Present Illness: Diane Cohen is a 74 y.o. female last seen in September.  She presents for a follow-up visit.  Still reports intermittent sense of palpitations.  She is under a tremendous amount of caregiver stress however, takes care of her husband at home with ALS, also trying to train separate help to come in 3 days of the week.  She states that she sometimes forgets to take her medications regularly.  Demadex was increased to 60 mg daily at the last visit. Follow-up lab work is noted below. Potassium supplements were increased.  Echocardiogram in September showed LVEF 55-60%, mildly enlarged RV, moderate left atrial enlargement, moderate tricuspid regurgitation, and PASP estimated 51 mmHg.  Today I talked with her about referral back to the atrial fibrillation clinic for discussion regarding antiarrhythmic therapy and cardioversion.  She has persistent atrial fibrillation and remains intermittently symptomatic, had a previous ablation by Dr. Rayann Heman in 2019.  Past Medical History:  Diagnosis Date  . Anxiety   . Atrial fibrillation (HCC)    Atrial fibrillation ablation in 2019 - Dr. Rayann Heman  . Chronic diastolic heart failure ([place])   . Depression   . Essential hypertension   . History of TIA (transient ischemic attack)   . Hyperlipidemia   . Hypothyroidism   . Pneumonia     Past Surgical History:  Procedure Laterality Date  . ABDOMINAL HYSTERECTOMY    . ABLATION OF DYSRHYTHMIC FOCUS  07/06/2017  . ATRIAL FIBRILLATION ABLATION N/A 07/06/2017   Procedure: ATRIAL FIBRILLATION ABLATION;  Surgeon: Thompson Grayer, MD;  Location: [facility];  Service: Cardiovascular;  Laterality: N/A;  . BREAST SURGERY       biopsy  . CARDIOVERSION N/A 01/23/2016   Procedure: CARDIOVERSION;  Surgeon: Satira Sark, MD;  [facility];  Service: Cardiovascular;  Laterality: N/A;  . CARDIOVERSION N/A 11/13/2016   Procedure: CARDIOVERSION;  Surgeon: Arnoldo Lenis, MD;  [facility];  Service: Endoscopy;  Laterality: N/A;  . CARDIOVERSION N/A 04/20/2018   Procedure: CARDIOVERSION;  Surgeon: [name], MD;  Location: [facility] ENDOSCOPY;  Service: Cardiovascular;  Laterality: N/A;  . CHOLECYSTECTOMY    . COLONOSCOPY N/A 10/07/2018   Sigmoid and descending colon diverticulosis, three 6 to 15 mm polyps in rectum, ascending colon, and IC valve, one 6 mm polyp in cecum. (sessile serrated polyps and sessile serrated adenoma of ascending colon with focal high grade dysplasia.  . coloscopy    . ESOPHAGEAL DILATION  10/07/2018   Procedure: ESOPHAGEAL DILATION;  Surgeon: Daneil Dolin, MD;  [facility];  Service: Endoscopy;;  . ESOPHAGOGASTRODUODENOSCOPY N/A 10/07/2018   normal esophagus s/p dilation  . POLYPECTOMY  10/07/2018   Procedure: POLYPECTOMY;  Surgeon: Daneil Dolin, MD;  [facility];  Service: Endoscopy;;  Cecal polyps x 2 hot snare Ascending polyp x 1 Hot snare Rectal polyp x 1  . TEE WITHOUT CARDIOVERSION  07/06/2017  . TEE WITHOUT CARDIOVERSION N/A 07/06/2017   Procedure: TRANSESOPHAGEAL ECHOCARDIOGRAM (TEE);  Surgeon: Josue Hector, MD;  Location: [facility] ENDOSCOPY;  Service: Cardiovascular;  Laterality: N/A;    Current Outpatient Medications  Medication Sig Dispense Refill  . acetaminophen (  TYLENOL) 650 MG CR tablet Take 1,300 mg every 8 (eight) hours as needed by mouth for pain.    Marland Kitchen diltiazem (CARDIZEM CD) 300 MG 24 hr capsule Take 1 capsule (300 mg total) by mouth daily. 30 capsule 5  . diltiazem (CARDIZEM) 30 MG tablet Take 1 tablet (30 mg total) by mouth 4 (four) times daily as needed (for atrial fibrillation). 60 tablet 3  . DULoxetine (CYMBALTA) 60  MG capsule Take 1 capsule (60 mg total) by mouth daily. 30 capsule 1  . levothyroxine (SYNTHROID, LEVOTHROID) 137 MCG tablet Take 137 mcg by mouth daily before breakfast.     . lisinopril (ZESTRIL) 10 MG tablet Take 1 tablet (10 mg total) by mouth daily. 90 tablet 3  . magnesium oxide (MAG-OX) 400 MG tablet Take 1 tablet (400 mg total) by mouth daily. 20 tablet 0  . metoprolol succinate (TOPROL-XL) 100 MG 24 hr tablet Take 100 mg by mouth daily.    . pantoprazole (PROTONIX) 40 MG tablet TAKE 1 TABLET BY MOUTH DAILY 30 MINUTES BEFORE BREAKFAST 30 tablet 5  . potassium chloride SA (KLOR-CON) 20 MEQ tablet Take 2 tablets (40 mEq total) by mouth in the morning. & 1 tablet (20 meq total) by mouth in the evening 270 tablet 3  . rivaroxaban (XARELTO) 20 MG TABS tablet TAKE 1 TABLET BY MOUTH EVERY DAY WITH DINNER 30 tablet 11  . torsemide (DEMADEX) 20 MG tablet Take 3 tablets (60 mg total) by mouth every morning. 90 tablet 3   No current facility-administered medications for this visit.   Allergies:  Levaquin [levofloxacin] and Sulfa antibiotics   ROS: No palpitations or syncope.   Physical Exam: VS:  BP 130/62   Pulse 86   Ht 5\' 1"  (1.549 m)   Wt 171 lb (77.6 kg)   SpO2 99%   BMI 32.31 kg/m , BMI Body mass index is 32.31 kg/m.  Wt Readings from Last 3 Encounters:  01/19/20 171 lb (77.6 kg)  11/27/19 176 lb (79.8 kg)  07/12/19 170 lb 9.6 oz (77.4 kg)    General: Patient appears comfortable at rest. HEENT: Conjunctiva and lids normal, wearing a mask. Neck: Supple, no elevated JVP or carotid bruits, no thyromegaly. Lungs: Clear to auscultation, nonlabored breathing at rest. Cardiac: Irregularly irregular, no S3 or significant systolic murmur, no pericardial rub. Abdomen: Soft, bowel sounds present. Extremities: Lymphedema, distal pulses 2+.  ECG:  An ECG dated 11/27/2019 was personally reviewed today and demonstrated:  Atrial fibrillation with PVC versus aberrantly conducted  complex.  Recent Labwork: 06/23/2019: ALT 22; AST 29; B Natriuretic Peptide 337.0; Magnesium 2.3; TSH 0.520 12/01/2019: BUN 21; Creatinine, Ser 1.28; Hemoglobin 13.4; Platelets 287; Potassium 3.4; Sodium 139   Other Studies Reviewed Today:  Echocardiogram 12/01/2019: 1. Left ventricular ejection fraction, by estimation, is 55 to 60%. The  left ventricle has normal function. The left ventricle has no regional  wall motion abnormalities. Left ventricular diastolic parameters are  indeterminate.  2. Right ventricular systolic function is normal. The right ventricular  size is mildly enlarged.  3. Left atrial size was moderately dilated.  4. Right atrial size was moderately dilated.  5. The mitral valve is normal in structure. Mild mitral valve  regurgitation. No evidence of mitral stenosis.  6. Tricuspid valve regurgitation is moderate.  7. The aortic valve is tricuspid. Aortic valve regurgitation is not  visualized. No aortic stenosis is present.  8. Moderate pulmonary HTN, PASP is 51 mmHg.  9. The inferior vena cava  is normal in size with greater than 50%  respiratory variability, suggesting right atrial pressure of 3 mmHg.   Assessment and Plan:  1.  Persistent atrial fibrillation with CHA2DS2-VASc score of 6.  She remains intermittently symptomatic despite efforts at heart rate control and continued anticoagulation.  It sounds like she has not been entirely consistent with her medications however in the setting of caregiver stress at home.  Follow-up echocardiogram shows LVEF 55 to 60%.  Left atrium is moderately dilated.  We have increased her diuretic regimen in the interim as well as potassium supplement.  Plan is to refer her to the atrial fibrillation clinic for discussion regarding initiation of antiarrhythmic therapy and cardioversion attempt.  She is likely a reasonable candidate for Tikosyn or amiodarone.  2.  Chronic diastolic heart failure, LVEF remains stable at 55 to  60% by recent echocardiogram.  We have increased her diuretic regimen.  Weight is down about 5 pounds from September.  3.  Chronic lymphedema.  Medication Adjustments/Labs and Tests Ordered: Current medicines are reviewed at length with the patient today.  Concerns regarding medicines are outlined above.   Tests Ordered: No orders of the defined types were placed in this encounter.   Medication Changes: No orders of the defined types were placed in this encounter.   Disposition:  Follow up atrial fibrillation clinic and then 3 months in the [place] office.  Signed, Satira Sark, MD, [facility] 01/19/2020 1:49 PM    [facility] at [facility] [address] [place], [place] [address] Phone: [phone]; Fax: [phone]

## 2020-01-19 ENCOUNTER — Other Ambulatory Visit: Payer: Self-pay

## 2020-01-19 ENCOUNTER — Encounter: Payer: Self-pay | Admitting: Cardiology

## 2020-01-19 ENCOUNTER — Ambulatory Visit (INDEPENDENT_AMBULATORY_CARE_PROVIDER_SITE_OTHER): Payer: Medicare HMO | Admitting: Cardiology

## 2020-01-19 VITALS — BP 130/62 | HR 86 | Ht 61.0 in | Wt 171.0 lb

## 2020-01-19 DIAGNOSIS — I4819 Other persistent atrial fibrillation: Secondary | ICD-10-CM | POA: Diagnosis not present

## 2020-01-19 DIAGNOSIS — I5032 Chronic diastolic (congestive) heart failure: Secondary | ICD-10-CM | POA: Diagnosis not present

## 2020-01-19 NOTE — Patient Instructions (Signed)
Medication Instructions:  Your physician recommends that you continue on your current medications as directed. Please refer to the Current Medication list given to you today.  *If you need a refill on your cardiac medications before your next appointment, please call your pharmacy*   Lab Work: None  If you have labs (blood work) drawn today and your tests are completely normal, you will receive your results only by: Marland Kitchen MyChart Message (if you have MyChart) OR . A paper copy in the mail If you have any lab test that is abnormal or we need to change your treatment, we will call you to review the results.   Testing/Procedures: None    Follow-Up: At [facility], you and your health needs are our priority.  As part of our continuing mission to provide you with exceptional heart care, we have created designated Provider Care Teams.  These Care Teams include your primary Cardiologist (physician) and Advanced Practice Providers (APPs -  Physician Assistants and Nurse Practitioners) who all work together to provide you with the care you need, when you need it.  We recommend signing up for the patient portal called "MyChart".  Sign up information is provided on this After Visit Summary.  MyChart is used to connect with patients for Virtual Visits (Telemedicine).  Patients are able to view lab/test results, encounter notes, upcoming appointments, etc.  Non-urgent messages can be sent to your provider as well.   To learn more about what you can do with MyChart, go to [email or web address].    Your next appointment:   3 month(s)  The format for your next appointment:   In Person  Provider:   Rozann Lesches, MD   Other Instructions We have referred you to A-fib clinic in [place].They will call you with an appointment.      Thank you for choosing [facility] !

## 2020-01-24 ENCOUNTER — Encounter (HOSPITAL_COMMUNITY): Payer: Self-pay | Admitting: Nurse Practitioner

## 2020-01-24 ENCOUNTER — Other Ambulatory Visit: Payer: Self-pay

## 2020-01-24 ENCOUNTER — Other Ambulatory Visit (HOSPITAL_COMMUNITY): Payer: Self-pay | Admitting: Nurse Practitioner

## 2020-01-24 ENCOUNTER — Ambulatory Visit (HOSPITAL_COMMUNITY)
Admission: RE | Admit: 2020-01-24 | Discharge: 2020-01-24 | Disposition: A | Discharge status: Home / Self Care | Payer: Medicare HMO | Source: Ambulatory Visit | Attending: Nurse Practitioner | Admitting: Nurse Practitioner

## 2020-01-24 VITALS — BP 132/72 | HR 96 | Ht 61.0 in | Wt 170.6 lb

## 2020-01-24 DIAGNOSIS — I5032 Chronic diastolic (congestive) heart failure: Secondary | ICD-10-CM | POA: Diagnosis not present

## 2020-01-24 DIAGNOSIS — Z8249 Family history of ischemic heart disease and other diseases of the circulatory system: Secondary | ICD-10-CM | POA: Diagnosis not present

## 2020-01-24 DIAGNOSIS — I110 Hypertensive heart disease with heart failure: Secondary | ICD-10-CM | POA: Diagnosis not present

## 2020-01-24 DIAGNOSIS — I4819 Other persistent atrial fibrillation: Secondary | ICD-10-CM | POA: Insufficient documentation

## 2020-01-24 DIAGNOSIS — D6869 Other thrombophilia: Secondary | ICD-10-CM | POA: Diagnosis not present

## 2020-01-24 DIAGNOSIS — Z79899 Other long term (current) drug therapy: Secondary | ICD-10-CM | POA: Insufficient documentation

## 2020-01-24 DIAGNOSIS — Z87891 Personal history of nicotine dependence: Secondary | ICD-10-CM | POA: Insufficient documentation

## 2020-01-24 DIAGNOSIS — Z7901 Long term (current) use of anticoagulants: Secondary | ICD-10-CM | POA: Diagnosis not present

## 2020-01-24 MED ORDER — AMIODARONE HCL 200 MG PO TABS: ORAL_TABLET | ORAL | 0 refills | Status: DC

## 2020-01-24 NOTE — Patient Instructions (Signed)
Amiodarone 200mg  tablet -- Take 1 tablet by mouth twice a day for 1 month then reduce to 1 tablet daily  Have EKG performed in the [place] office in 1 week.

## 2020-01-24 NOTE — Progress Notes (Signed)
Primary Care Physician: Celene Squibb, MD Referring Physician: Dr.Sam  Melea Diane Cohen is a 74 y.o. female with a h/o HTN, persistent  afib, chronic diastolic HF, that is in the afib clinic to discuss antiarrythmic's. By my estimation, the pt has been in persistent afib since April  of this year if not longer as the 2 cardiology visits prior to that were tele visits. Later, the pt has felt  more short of breath with LLE. It appears that she had been on amiodarone for years prior to failing drug. Dr. Rayann Heman stopped drug in 01/2017 and took pt for an ablation, spring of 2019. It was delayed as pt's husband has Diane Cohen and could not arrange for someone to be with him. It appears that she did well staying in [place] until 04/2018 and required cardioversion, but had heart failure associated with that and was admitted . She remained in SR. She  was admitted with afib and HF in April 2021. Sent home in afib with rate control.    She  is in afib today with v rates in the 90's. She has chronic LLE, her diuretic was increased last week x 3 days and she is back to her usual LLE. We discussed antiarrythmic's. I think her 2 best options to get back in SR are  amiodarone or Tikosyn. She repeots that she can not be in the hospital for a Tikosyn admit as she still  is the caregiver for her husband. She would prefer to try amiodarone again.    Today, she denies symptoms of palpitations, chest pain, shortness of breath, orthopnea, PND, lower extremity edema, dizziness, presyncope, syncope, or neurologic sequela. The patient is tolerating medications without difficulties and is otherwise without complaint today.   Past Medical History:  Diagnosis Date  . Anxiety   . Atrial fibrillation (HCC)    Atrial fibrillation ablation in 2019 - Dr. Rayann Heman  . Chronic diastolic heart failure ([place])   . Depression   . Essential hypertension   . History of TIA (transient ischemic attack)   . Hyperlipidemia   .  Hypothyroidism   . Pneumonia    Past Surgical History:  Procedure Laterality Date  . ABDOMINAL HYSTERECTOMY    . ABLATION OF DYSRHYTHMIC FOCUS  07/06/2017  . ATRIAL FIBRILLATION ABLATION N/A 07/06/2017   Procedure: ATRIAL FIBRILLATION ABLATION;  Surgeon: Thompson Grayer, MD;  Location: [place] [facility];  Service: Cardiovascular;  Laterality: N/A;  . BREAST SURGERY     biopsy  . CARDIOVERSION N/A 01/23/2016   Procedure: CARDIOVERSION;  Surgeon: Satira Sark, MD;  [facility];  Service: Cardiovascular;  Laterality: N/A;  . CARDIOVERSION N/A 11/13/2016   Procedure: CARDIOVERSION;  Surgeon: Arnoldo Lenis, MD;  [facility];  Service: Endoscopy;  Laterality: N/A;  . CARDIOVERSION N/A 04/20/2018   Procedure: CARDIOVERSION;  Surgeon: [name], MD;  Location: [facility] ENDOSCOPY;  Service: Cardiovascular;  Laterality: N/A;  . CHOLECYSTECTOMY    . COLONOSCOPY N/A 10/07/2018   Sigmoid and descending colon diverticulosis, three 6 to 15 mm polyps in rectum, ascending colon, and IC valve, one 6 mm polyp in cecum. (sessile serrated polyps and sessile serrated adenoma of ascending colon with focal high grade dysplasia.  . coloscopy    . ESOPHAGEAL DILATION  10/07/2018   Procedure: ESOPHAGEAL DILATION;  Surgeon: Daneil Dolin, MD;  [facility];  Service: Endoscopy;;  . ESOPHAGOGASTRODUODENOSCOPY N/A 10/07/2018   normal esophagus  s/p dilation  . POLYPECTOMY  10/07/2018   Procedure: POLYPECTOMY;  Surgeon: Daneil Dolin, MD;  [facility];  Service: Endoscopy;;  Cecal polyps x 2 hot snare Ascending polyp x 1 Hot snare Rectal polyp x 1  . TEE WITHOUT CARDIOVERSION  07/06/2017  . TEE WITHOUT CARDIOVERSION N/A 07/06/2017   Procedure: TRANSESOPHAGEAL ECHOCARDIOGRAM (TEE);  Surgeon: Josue Hector, MD;  Location: [facility] ENDOSCOPY;  Service: Cardiovascular;  Laterality: N/A;    Current Outpatient Medications  Medication Sig Dispense Refill  .  acetaminophen (TYLENOL) 650 MG CR tablet Take 1,300 mg by mouth every 8 (eight) hours as needed for pain. Arthritis Strength    . diltiazem (CARDIZEM CD) 300 MG 24 hr capsule Take 1 capsule (300 mg total) by mouth daily. 30 capsule 5  . diltiazem (CARDIZEM) 30 MG tablet Take 1 tablet (30 mg total) by mouth 4 (four) times daily as needed (for atrial fibrillation). 60 tablet 3  . DULoxetine (CYMBALTA) 60 MG capsule Take 1 capsule (60 mg total) by mouth daily. 30 capsule 1  . levothyroxine (SYNTHROID, LEVOTHROID) 137 MCG tablet Take 137 mcg by mouth daily before breakfast.     . lisinopril (ZESTRIL) 10 MG tablet Take 1 tablet (10 mg total) by mouth daily. 90 tablet 3  . metoprolol succinate (TOPROL-XL) 100 MG 24 hr tablet Take 100 mg by mouth daily.    . pantoprazole (PROTONIX) 40 MG tablet TAKE 1 TABLET BY MOUTH DAILY 30 MINUTES BEFORE BREAKFAST 30 tablet 5  . potassium chloride SA (KLOR-CON) 20 MEQ tablet Take 2 tablets (40 mEq total) by mouth in the morning. & 1 tablet (20 meq total) by mouth in the evening 270 tablet 3  . rivaroxaban (XARELTO) 20 MG TABS tablet TAKE 1 TABLET BY MOUTH EVERY DAY WITH DINNER 30 tablet 11  . torsemide (DEMADEX) 20 MG tablet Take 3 tablets (60 mg total) by mouth every morning. 90 tablet 3  . amiodarone (PACERONE) 200 MG tablet Take 1 tablet by mouth twice a day for 1 month then reduce to 1 tablet daily 60 tablet 0  . magnesium oxide (MAG-OX) 400 MG tablet Take 1 tablet (400 mg total) by mouth daily. (Patient not taking: Reported on 01/24/2020) 20 tablet 0   No current facility-administered medications for this encounter.    Allergies  Allergen Reactions  . Levaquin [Levofloxacin] Other (See Comments)    Headache   . Sulfa Antibiotics Hives    Social History   Socioeconomic History  . Marital status: Married    Spouse name: Not on file  . Number of children: Not on file  . Years of education: Not on file  . Highest education level: Not on file    Occupational History  . Not on file  Tobacco Use  . Smoking status: Former Smoker    Packs/day: 0.50    Types: Cigarettes    Quit date: 01/22/1973    Years since quitting: 47.0  . Smokeless tobacco: Never Used  Vaping Use  . Vaping Use: Never used  Substance and Sexual Activity  . Alcohol use: Not Currently    Alcohol/week: 0.0 standard drinks    Comment: 3 times a year: Christmas, Thanksgiving, and maybe just because   . Drug use: No  . Sexual activity: Yes    Birth control/protection: Surgical  Other Topics Concern  . Not on file  Social History Narrative   Lives with spouse in [place] has ALS and is dependant on  the patient for transition from bed to chair.   Social Determinants of Health   Financial Resource Strain:   . Difficulty of Paying Living Expenses: Not on file  Food Insecurity:   . Worried About Charity fundraiser in the Last Year: Not on file  . Ran Out of Food in the Last Year: Not on file  Transportation Needs:   . Lack of Transportation (Medical): Not on file  . Lack of Transportation (Non-Medical): Not on file  Physical Activity:   . Days of Exercise per Week: Not on file  . Minutes of Exercise per Session: Not on file  Stress:   . Feeling of Stress : Not on file  Social Connections:   . Frequency of Communication with Friends and Family: Not on file  . Frequency of Social Gatherings with Friends and Family: Not on file  . Attends Religious Services: Not on file  . Active Member of Clubs or Organizations: Not on file  . Attends Archivist Meetings: Not on file  . Marital Status: Not on file  Intimate Partner Violence:   . Fear of Current or Ex-Partner: Not on file  . Emotionally Abused: Not on file  . Physically Abused: Not on file  . Sexually Abused: Not on file    Family History  Problem Relation Age of Onset  . Hypertension Other   . Hypertension Father   . Hypertension Mother   . Anemia Sister   . Hypertension  Sister   . Colon cancer Neg Hx   . Colon polyps Neg Hx     ROS- All systems are reviewed and negative except as per the HPI above  Physical Exam: Vitals:   01/24/20 1353  BP: 132/72  Pulse: 96  Weight: 77.4 kg  Height: 5\' 1"  (1.549 m)   Wt Readings from Last 3 Encounters:  01/24/20 77.4 kg  01/19/20 77.6 kg  11/27/19 79.8 kg    Labs: Lab Results  Component Value Date   NA 139 12/01/2019   K 3.4 (L) 12/01/2019   CL 99 12/01/2019   CO2 29 12/01/2019   GLUCOSE 94 12/01/2019   BUN 21 12/01/2019   CREATININE 1.28 (H) 12/01/2019   CALCIUM 10.5 (H) 12/01/2019   MG 2.3 06/23/2019   Lab Results  Component Value Date   INR 1.31 01/22/2016   No results found for: CHOL, HDL, LDLCALC, TRIG   GEN- The patient is well appearing, alert and oriented x 3 today.   Head- normocephalic, atraumatic Eyes-  Sclera clear, conjunctiva pink Ears- hearing intact Oropharynx- clear Neck- supple, no JVP Lymph- no cervical lymphadenopathy Lungs- Clear to ausculation bilaterally, normal work of breathing Heart- irregular rate and rhythm, no murmurs, rubs or gallops, PMI not laterally displaced GI- soft, NT, ND, + BS Extremities- no clubbing, cyanosis, or edema MS- no significant deformity or atrophy Skin- no rash or lesion Psych- euthymic mood, full affect Neuro- strength and sensation are intact  EKG-afib at 96 bpm, qrs int 68 ms, qtc 449 ms  Echo- 11/2019-1. Left ventricular ejection fraction, by estimation, is 55 to 60%. The  left ventricle has normal function. The left ventricle has no regional  wall motion abnormalities. Left ventricular diastolic parameters are  indeterminate.  2. Right ventricular systolic function is normal. The right ventricular  size is mildly enlarged.  3. Left atrial size was moderately dilated.  4. Right atrial size was moderately dilated.  5. The mitral valve is normal in structure. Mild mitral  valve  regurgitation. No evidence of mitral stenosis.    6. Tricuspid valve regurgitation is moderate.  7. The aortic valve is tricuspid. Aortic valve regurgitation is not  visualized. No aortic stenosis is present.  8. Moderate pulmonary HTN, PASP is 51 mmHg.  9. The inferior vena cava is normal in size with greater than 50%  respiratory variability, suggesting right atrial pressure of 3 mmHg.    Assessment and Plan: 1. Persistent afib Unsure how long she may have been in afib, probably 9+ months I discussed options with her  She does not want a hospital stay for tikosyn  She was on amiodarone prior to ablation and would like to try this again  Will start 200 mg bid and she will have f/u ekg in the [place] office in one week I will see her again in 2 weeks to see how she is tolerating amiodarone loading  Will plan on cardioversion in one month after amiodarone loading   2. CHA2DS2VASc score is at least 4 Continue xarelto 20 mg daily   3. HTN Stable  4. Chronic diastolic HF Fluid status appears stable  Diuretic as ordered   F/u in 2 weeks   Diane Cohen, [facility] [address] [place], [place] [address] [phone]

## 2020-01-25 ENCOUNTER — Other Ambulatory Visit (HOSPITAL_COMMUNITY): Payer: Self-pay | Admitting: Nurse Practitioner

## 2020-01-31 ENCOUNTER — Other Ambulatory Visit: Payer: Self-pay

## 2020-01-31 ENCOUNTER — Ambulatory Visit (INDEPENDENT_AMBULATORY_CARE_PROVIDER_SITE_OTHER): Payer: Medicare HMO

## 2020-01-31 VITALS — BP 118/68 | HR 78

## 2020-01-31 DIAGNOSIS — I4819 Other persistent atrial fibrillation: Secondary | ICD-10-CM | POA: Diagnosis not present

## 2020-02-06 ENCOUNTER — Other Ambulatory Visit: Payer: Self-pay | Admitting: Student

## 2020-02-08 ENCOUNTER — Ambulatory Visit (HOSPITAL_COMMUNITY)
Admission: RE | Admit: 2020-02-08 | Discharge: 2020-02-08 | Disposition: A | Discharge status: Home / Self Care | Payer: Medicare HMO | Source: Ambulatory Visit | Attending: Nurse Practitioner | Admitting: Nurse Practitioner

## 2020-02-08 ENCOUNTER — Encounter (HOSPITAL_COMMUNITY): Payer: Self-pay | Admitting: Nurse Practitioner

## 2020-02-08 ENCOUNTER — Other Ambulatory Visit: Payer: Self-pay

## 2020-02-08 VITALS — BP 134/70 | HR 69 | Ht 61.0 in | Wt 179.0 lb

## 2020-02-08 DIAGNOSIS — I4819 Other persistent atrial fibrillation: Secondary | ICD-10-CM

## 2020-02-08 DIAGNOSIS — I5032 Chronic diastolic (congestive) heart failure: Secondary | ICD-10-CM | POA: Insufficient documentation

## 2020-02-08 DIAGNOSIS — Z8673 Personal history of transient ischemic attack (TIA), and cerebral infarction without residual deficits: Secondary | ICD-10-CM | POA: Diagnosis not present

## 2020-02-08 DIAGNOSIS — Z79899 Other long term (current) drug therapy: Secondary | ICD-10-CM | POA: Insufficient documentation

## 2020-02-08 DIAGNOSIS — Z87891 Personal history of nicotine dependence: Secondary | ICD-10-CM | POA: Insufficient documentation

## 2020-02-08 DIAGNOSIS — I110 Hypertensive heart disease with heart failure: Secondary | ICD-10-CM | POA: Insufficient documentation

## 2020-02-08 DIAGNOSIS — Z79890 Hormone replacement therapy (postmenopausal): Secondary | ICD-10-CM | POA: Diagnosis not present

## 2020-02-08 DIAGNOSIS — D6869 Other thrombophilia: Secondary | ICD-10-CM

## 2020-02-08 DIAGNOSIS — Z7901 Long term (current) use of anticoagulants: Secondary | ICD-10-CM | POA: Diagnosis not present

## 2020-02-08 LAB — BASIC METABOLIC PANEL
Anion gap: 9 (ref 5–15)
BUN: 13 mg/dL (ref 8–23)
CO2: 24 mmol/L (ref 22–32)
Calcium: 10.2 mg/dL (ref 8.9–10.3)
Chloride: 104 mmol/L (ref 98–111)
Creatinine, Ser: 1.11 mg/dL — ABNORMAL HIGH (ref 0.44–1.00)
GFR, Estimated: 52 mL/min — ABNORMAL LOW (ref >60)
Glucose, Bld: 98 mg/dL (ref 70–99)
Potassium: 4.3 mmol/L (ref 3.5–5.1)
Sodium: 137 mmol/L (ref 135–145)

## 2020-02-08 LAB — CBC
HCT: 39.1 % (ref 36.0–46.0)
Hemoglobin: 12 g/dL (ref 12.0–15.0)
MCH: 30.2 pg (ref 26.0–34.0)
MCHC: 30.7 g/dL (ref 30.0–36.0)
MCV: 98.5 fL (ref 80.0–100.0)
Platelets: 242 10*3/uL (ref 150–400)
RBC: 3.97 MIL/uL (ref 3.87–5.11)
RDW: 15.2 % (ref 11.5–15.5)
WBC: 6.3 10*3/uL (ref 4.0–10.5)
nRBC: 0 % (ref 0.0–0.2)

## 2020-02-08 NOTE — Patient Instructions (Signed)
Will have the [place] office call you to setup cardioversion with Dr. Domenic Polite week of December 13th

## 2020-02-08 NOTE — Progress Notes (Signed)
Primary Care Physician: Celene Squibb, MD Referring Physician: Dr.Sam  Diane Cohen is a 75 y.o. female with a h/o HTN, persistent  afib, chronic diastolic HF, that is in the afib clinic to discuss antiarrythmic's. By my estimation, the pt has been in persistent afib since April  of this year if not longer as the 2 cardiology visits prior to that were tele visits. Later, the pt has felt  more short of breath with LLE. It appears that she had been on amiodarone for years prior to failing drug. Dr. Rayann Heman stopped drug in 01/2017 and took pt for an ablation, spring of 2019. It was delayed as pt's husband has [name] disease and could not arrange for someone to be with him. It appears that she did well staying in [place] until 04/2018 and required cardioversion, but had heart failure associated with that and was admitted . She remained in SR. She  was admitted with afib and HF in April 2021. Sent home in afib with rate control.    She  is in afib today with v rates in the 90's. She has chronic LLE, her diuretic was increased last week x 3 days and she is back to her usual LLE. We discussed antiarrythmic's. I think her 2 best options to get back in SR are  amiodarone or Tikosyn. She repeots that she can not be in the hospital for a Tikosyn admit as she still  is the caregiver for her husband. She would prefer to try amiodarone again.    F/u in afib clinic, 02/08/20. She has been loading on amiodarone 200 mg bid for several weeks and will set up for cardioversion after 12/13. EKG shows rate controlled  afib at 69 bpm. Her weight is up 9 lbs today, but she did not take her diuretic this am and has not shown any discretion with her diet lately, salty and sweet food.   Today, she denies symptoms of palpitations, chest pain, shortness of breath, orthopnea, PND, lower extremity edema, dizziness, presyncope, syncope, or neurologic sequela. The patient is tolerating medications without difficulties and is  otherwise without complaint today.   Past Medical History:  Diagnosis Date  . Anxiety   . Atrial fibrillation (HCC)    Atrial fibrillation ablation in 2019 - Dr. Rayann Heman  . Chronic diastolic heart failure ([place])   . Depression   . Essential hypertension   . History of TIA (transient ischemic attack)   . Hyperlipidemia   . Hypothyroidism   . Pneumonia    Past Surgical History:  Procedure Laterality Date  . ABDOMINAL HYSTERECTOMY    . ABLATION OF DYSRHYTHMIC FOCUS  07/06/2017  . ATRIAL FIBRILLATION ABLATION N/A 07/06/2017   Procedure: ATRIAL FIBRILLATION ABLATION;  Surgeon: Thompson Grayer, MD;  Location: [place] [facility];  Service: Cardiovascular;  Laterality: N/A;  . BREAST SURGERY     biopsy  . CARDIOVERSION N/A 01/23/2016   Procedure: CARDIOVERSION;  Surgeon: Satira Sark, MD;  [facility];  Service: Cardiovascular;  Laterality: N/A;  . CARDIOVERSION N/A 11/13/2016   Procedure: CARDIOVERSION;  Surgeon: Arnoldo Lenis, MD;  [facility];  Service: Endoscopy;  Laterality: N/A;  . CARDIOVERSION N/A 04/20/2018   Procedure: CARDIOVERSION;  Surgeon: [name], MD;  Location: [facility] ENDOSCOPY;  Service: Cardiovascular;  Laterality: N/A;  . CHOLECYSTECTOMY    . COLONOSCOPY N/A 10/07/2018   Sigmoid and descending colon diverticulosis, three 6 to 15 mm polyps in  rectum, ascending colon, and IC valve, one 6 mm polyp in cecum. (sessile serrated polyps and sessile serrated adenoma of ascending colon with focal high grade dysplasia.  . coloscopy    . ESOPHAGEAL DILATION  10/07/2018   Procedure: ESOPHAGEAL DILATION;  Surgeon: Daneil Dolin, MD;  [facility];  Service: Endoscopy;;  . ESOPHAGOGASTRODUODENOSCOPY N/A 10/07/2018   normal esophagus s/p dilation  . POLYPECTOMY  10/07/2018   Procedure: POLYPECTOMY;  Surgeon: Daneil Dolin, MD;  [facility];  Service: Endoscopy;;  Cecal polyps x 2 hot snare Ascending polyp x 1 Hot snare Rectal  polyp x 1  . TEE WITHOUT CARDIOVERSION  07/06/2017  . TEE WITHOUT CARDIOVERSION N/A 07/06/2017   Procedure: TRANSESOPHAGEAL ECHOCARDIOGRAM (TEE);  Surgeon: Josue Hector, MD;  Location: [facility] ENDOSCOPY;  Service: Cardiovascular;  Laterality: N/A;    Current Outpatient Medications  Medication Sig Dispense Refill  . acetaminophen (TYLENOL) 650 MG CR tablet Take 1,300 mg by mouth every 8 (eight) hours as needed for pain. Arthritis Strength    . amiodarone (PACERONE) 200 MG tablet TAKE 1 TABLET BY MOUTH TWICE DAILY FOR 30 DAYS, THEN REDUCE TO 1 TABLET BY MOUTH EVERY DAY 180 tablet 0  . diltiazem (CARDIZEM CD) 300 MG 24 hr capsule TAKE 1 CAPSULE(300 MG) BY MOUTH DAILY 30 capsule 5  . diltiazem (CARDIZEM) 30 MG tablet Take 1 tablet (30 mg total) by mouth 4 (four) times daily as needed (for atrial fibrillation). 60 tablet 3  . DULoxetine (CYMBALTA) 60 MG capsule Take 1 capsule (60 mg total) by mouth daily. 30 capsule 1  . levothyroxine (SYNTHROID) 125 MCG tablet Take 125 mcg by mouth daily before breakfast.     . lisinopril (ZESTRIL) 10 MG tablet Take 1 tablet (10 mg total) by mouth daily. 90 tablet 3  . metoprolol succinate (TOPROL-XL) 100 MG 24 hr tablet Take 100 mg by mouth daily.    . pantoprazole (PROTONIX) 40 MG tablet TAKE 1 TABLET BY MOUTH DAILY 30 MINUTES BEFORE BREAKFAST 30 tablet 5  . potassium chloride SA (KLOR-CON) 20 MEQ tablet Take 2 tablets (40 mEq total) by mouth in the morning. & 1 tablet (20 meq total) by mouth in the evening 270 tablet 3  . rivaroxaban (XARELTO) 20 MG TABS tablet TAKE 1 TABLET BY MOUTH EVERY DAY WITH DINNER 30 tablet 11  . torsemide (DEMADEX) 20 MG tablet Take 3 tablets (60 mg total) by mouth every morning. 90 tablet 3  . magnesium oxide (MAG-OX) 400 MG tablet Take 1 tablet (400 mg total) by mouth daily. (Patient not taking: Reported on 01/24/2020) 20 tablet 0   No current facility-administered medications for this encounter.    Allergies  Allergen Reactions  .  Levaquin [Levofloxacin] Other (See Comments)    Headache   . Sulfa Antibiotics Hives    Social History   Socioeconomic History  . Marital status: Married    Spouse name: Not on file  . Number of children: Not on file  . Years of education: Not on file  . Highest education level: Not on file  Occupational History  . Not on file  Tobacco Use  . Smoking status: Former Smoker    Packs/day: 0.50    Types: Cigarettes    Quit date: 01/22/1973    Years since quitting: 47.0  . Smokeless tobacco: Never Used  Vaping Use  . Vaping Use: Never used  Substance and Sexual Activity  . Alcohol use: Not Currently  Alcohol/week: 0.0 standard drinks    Comment: 3 times a year: Christmas, Thanksgiving, and maybe just because   . Drug use: No  . Sexual activity: Yes    Birth control/protection: Surgical  Other Topics Concern  . Not on file  Social History Narrative   Lives with spouse in [place] has ALS and is dependant on the patient for transition from bed to chair.   Social Determinants of Health   Financial Resource Strain:   . Difficulty of Paying Living Expenses: Not on file  Food Insecurity:   . Worried About Charity fundraiser in the Last Year: Not on file  . Ran Out of Food in the Last Year: Not on file  Transportation Needs:   . Lack of Transportation (Medical): Not on file  . Lack of Transportation (Non-Medical): Not on file  Physical Activity:   . Days of Exercise per Week: Not on file  . Minutes of Exercise per Session: Not on file  Stress:   . Feeling of Stress : Not on file  Social Connections:   . Frequency of Communication with Friends and Family: Not on file  . Frequency of Social Gatherings with Friends and Family: Not on file  . Attends Religious Services: Not on file  . Active Member of Clubs or Organizations: Not on file  . Attends Archivist Meetings: Not on file  . Marital Status: Not on file  Intimate Partner Violence:   . Fear of  Current or Ex-Partner: Not on file  . Emotionally Abused: Not on file  . Physically Abused: Not on file  . Sexually Abused: Not on file    Family History  Problem Relation Age of Onset  . Hypertension Other   . Hypertension Father   . Hypertension Mother   . Anemia Sister   . Hypertension Sister   . Colon cancer Neg Hx   . Colon polyps Neg Hx     ROS- All systems are reviewed and negative except as per the HPI above  Physical Exam: Vitals:   02/08/20 1414  BP: 134/70  Pulse: 69  Weight: 81.2 kg  Height: 5\' 1"  (1.549 m)   Wt Readings from Last 3 Encounters:  02/08/20 81.2 kg  01/24/20 77.4 kg  01/19/20 77.6 kg    Labs: Lab Results  Component Value Date   NA 139 12/01/2019   K 3.4 (L) 12/01/2019   CL 99 12/01/2019   CO2 29 12/01/2019   GLUCOSE 94 12/01/2019   BUN 21 12/01/2019   CREATININE 1.28 (H) 12/01/2019   CALCIUM 10.5 (H) 12/01/2019   MG 2.3 06/23/2019   Lab Results  Component Value Date   INR 1.31 01/22/2016   No results found for: CHOL, HDL, LDLCALC, TRIG   GEN- The patient is well appearing, alert and oriented x 3 today.   Head- normocephalic, atraumatic Eyes-  Sclera clear, conjunctiva pink Ears- hearing intact Oropharynx- clear Neck- supple, no JVP Lymph- no cervical lymphadenopathy Lungs- Clear to ausculation bilaterally, normal work of breathing Heart- irregular rate and rhythm, no murmurs, rubs or gallops, PMI not laterally displaced GI- soft, NT, ND, + BS Extremities- no clubbing, cyanosis, or edema MS- no significant deformity or atrophy Skin- no rash or lesion Psych- euthymic mood, full affect Neuro- strength and sensation are intact  EKG-afib at 69 bpm, qrs int 72 ms, qtc 429  ms  Echo- 11/2019-1. Left ventricular ejection fraction, by estimation, is 55 to 60%. The  left ventricle has normal function. The left ventricle has no regional  wall motion abnormalities. Left ventricular diastolic parameters are  indeterminate.  2.  Right ventricular systolic function is normal. The right ventricular  size is mildly enlarged.  3. Left atrial size was moderately dilated.  4. Right atrial size was moderately dilated.  5. The mitral valve is normal in structure. Mild mitral valve  regurgitation. No evidence of mitral stenosis.  6. Tricuspid valve regurgitation is moderate.  7. The aortic valve is tricuspid. Aortic valve regurgitation is not  visualized. No aortic stenosis is present.  8. Moderate pulmonary HTN, PASP is 51 mmHg.  9. The inferior vena cava is normal in size with greater than 50%  respiratory variability, suggesting right atrial pressure of 3 mmHg.    Assessment and Plan: 1. Persistent afib Unsure how long she may have been in afib, probably 9+ months I  previously discussed options with her  She did  not want a hospital stay for tikosyn  She was on amiodarone prior to ablation and would like to try this again  She  is taking 200 mg bid and she will be good to have a cardioversion the week of 12/13. She would like this set up with Dr. Domenic Polite at [facility]/bmet today  Amiodarone will need to be reduced to 200 mg following cardioversion   2. CHA2DS2VASc score is at least 4 Continue xarelto 20 mg daily, states no missed does for at least 3 weeks   3. HTN Stable  4. Chronic diastolic HF Weight is up to today but did not take diuretic this am  She has been eating more snock foods  Encouraged  to modify diet for weight loss  Fluid status appears stable per pt  Diuretic as ordered   F/u after cardioversion with Dr. Domenic Polite  afib clinic as needed   Geroge Baseman. Graclyn Lawther, [facility] [address] [place], [place] [address] [phone]

## 2020-02-12 ENCOUNTER — Other Ambulatory Visit (HOSPITAL_COMMUNITY): Payer: Self-pay | Admitting: *Deleted

## 2020-02-20 ENCOUNTER — Encounter (HOSPITAL_COMMUNITY): Payer: Medicare HMO

## 2020-02-20 ENCOUNTER — Other Ambulatory Visit (HOSPITAL_COMMUNITY): Payer: Medicare HMO

## 2020-02-22 ENCOUNTER — Ambulatory Visit (HOSPITAL_COMMUNITY): Admission: RE | Admit: 2020-02-22 | Payer: Medicare HMO | Source: Home / Self Care | Admitting: Cardiology

## 2020-02-22 ENCOUNTER — Encounter (HOSPITAL_COMMUNITY): Admission: RE | Payer: Self-pay | Source: Home / Self Care

## 2020-02-22 SURGERY — CARDIOVERSION
Anesthesia: Monitor Anesthesia Care

## 2020-02-26 ENCOUNTER — Other Ambulatory Visit: Payer: Self-pay | Admitting: Gastroenterology

## 2020-03-20 ENCOUNTER — Ambulatory Visit (HOSPITAL_COMMUNITY): Payer: Medicare HMO | Admitting: Nurse Practitioner

## 2020-04-07 ENCOUNTER — Other Ambulatory Visit: Payer: Self-pay | Admitting: Cardiology

## 2020-04-08 NOTE — Telephone Encounter (Signed)
Prescription refill request for Xarelto received.  Indication: PAF Last office visit: 01/19/20 Weight: 81.2kg Age: 75 Scr: 1.11 CrCl: 57.00  Refill for Xarelto 20mg  daily approved

## 2020-04-09 ENCOUNTER — Telehealth (HOSPITAL_COMMUNITY): Payer: Self-pay | Admitting: *Deleted

## 2020-04-09 NOTE — Telephone Encounter (Signed)
-----   Message from Sherran Needs, NP sent at 02/16/2020  1:20 PM EST ----- FYI: Was informed that patient left message with hospital registration that she requested to cx cardioversion, no reason left.

## 2020-04-09 NOTE — Telephone Encounter (Signed)
Late entry into chart. Called and spoke with patient 12/10. She states she will call when ready to schedule dccv.

## 2020-04-22 ENCOUNTER — Ambulatory Visit: Payer: Medicare HMO | Admitting: Cardiology

## 2020-05-15 ENCOUNTER — Telehealth: Payer: Self-pay

## 2020-05-15 NOTE — Telephone Encounter (Signed)
I will forward this to [facility].  We are going to need more information before scheduling a cardioversion.  This had originally been set up through the atrial fibrillation clinic for December 2021 and deferred.  Need to clarify doses of all medications including amiodarone, also compliance with Xarelto.  Would have her come in for visit with [facility] to get an updated note in the chart and then set up from there.

## 2020-05-15 NOTE — Telephone Encounter (Signed)
-----   Message from Bernita Raisin, RN sent at 02/16/2020  1:47 PM EST ----- Regarding: RE: pt cx cardioversion Patient states her help has a family member in the hospital.She has no idea when she can reschedule.She will call back when she is able to. ----- Message ----- From: Sherran Needs, NP Sent: 02/16/2020   1:29 PM EST To: Bernita Raisin, RN Subject: RE: pt cx cardioversion                        We talked to the pt. She needs to reschedule if you can reach out to her to do this. The person that was to stay with her husband had a conflict.  Thank you  ----- Message ----- From: Bernita Raisin, RN Sent: 02/16/2020  11:24 AM EST To: Sherran Needs, NP Subject: pt cx cardioversion                            FYI: Was informed that patient left message with hospital registration that she requested to cx cardioversion, no reason left.

## 2020-05-15 NOTE — Telephone Encounter (Signed)
Appointment scheduled with Bernerd Pho PA tomorrow (05/16/2020) @3L :00 pm at the [place] office. Patient aware.

## 2020-05-15 NOTE — Telephone Encounter (Addendum)
lmom to call back to schedule cardioiverson

## 2020-05-15 NOTE — Telephone Encounter (Signed)
Returned call to patient about her scheduling her cardioversion, she is ready to schedule.   Patient states she is in AFIB and would like to have done next week or the week after.

## 2020-05-16 ENCOUNTER — Encounter: Payer: Self-pay | Admitting: Student

## 2020-05-16 ENCOUNTER — Ambulatory Visit (INDEPENDENT_AMBULATORY_CARE_PROVIDER_SITE_OTHER): Payer: Medicare HMO | Admitting: Student

## 2020-05-16 ENCOUNTER — Other Ambulatory Visit: Payer: Self-pay

## 2020-05-16 VITALS — BP 122/58 | HR 93 | Ht 61.0 in | Wt 165.6 lb

## 2020-05-16 DIAGNOSIS — I4819 Other persistent atrial fibrillation: Secondary | ICD-10-CM | POA: Diagnosis not present

## 2020-05-16 DIAGNOSIS — I10 Essential (primary) hypertension: Secondary | ICD-10-CM

## 2020-05-16 DIAGNOSIS — I5032 Chronic diastolic (congestive) heart failure: Secondary | ICD-10-CM

## 2020-05-16 DIAGNOSIS — E039 Hypothyroidism, unspecified: Secondary | ICD-10-CM

## 2020-05-16 NOTE — Patient Instructions (Signed)
Medication Instructions:  Your physician recommends that you continue on your current medications as directed. Please refer to the Current Medication list given to you today.  *If you need a refill on your cardiac medications before your next appointment, please call your pharmacy*   Lab Work: CBC BMET  If you have labs (blood work) drawn today and your tests are completely normal, you will receive your results only by: Marland Kitchen MyChart Message (if you have MyChart) OR . A paper copy in the mail If you have any lab test that is abnormal or we need to change your treatment, we will call you to review the results.   Testing/Procedures: Your physician has recommended that you have a Cardioversion (DCCV). Electrical Cardioversion uses a jolt of electricity to your heart either through paddles or wired patches attached to your chest. This is a controlled, usually prescheduled, procedure. Defibrillation is done under light anesthesia in the hospital, and you usually go home the day of the procedure. This is done to get your heart back into a normal rhythm. You are not awake for the procedure. Please see the instruction sheet given to you today.     Follow-Up: At [facility], you and your health needs are our priority.  As part of our continuing mission to provide you with exceptional heart care, we have created designated Provider Care Teams.  These Care Teams include your primary Cardiologist (physician) and Advanced Practice Providers (APPs -  Physician Assistants and Nurse Practitioners) who all work together to provide you with the care you need, when you need it.  We recommend signing up for the patient portal called "MyChart".  Sign up information is provided on this After Visit Summary.  MyChart is used to connect with patients for Virtual Visits (Telemedicine).  Patients are able to view lab/test results, encounter notes, upcoming appointments, etc.  Non-urgent messages can be sent to your  provider as well.   To learn more about what you can do with MyChart, go to [email or web address].    Your next appointment:   Keep follow up with Dr. Myles Gip   Other Instructions

## 2020-05-16 NOTE — Progress Notes (Signed)
Cardiology Office Note    Date:  05/16/2020   ID:  Diane Cohen, Diane Cohen 1946-01-22, MRN [id number]  PCP:  Celene Squibb, MD  Cardiologist: Rozann Lesches, MD    Chief Complaint  Patient presents with   Follow-up    Pt wants to arrange DCCV    History of Present Illness:    Diane Cohen is a 75 y.o. female with past medical history of paroxysmal atrial fibrillation (s/p ablation in 2019, DCCV in 04/2018, recurrence in 06/2019), chronic diastolic CHF, HTN, HLD, Hypothyroidism and history of TIA who presents to the office today to arrange for her DCCV.   She was last examined by Dr. 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 She was referred to the [facility] for consideration of antiarrhythmic therapy. Options were reviewed with the patient in regards to Amiodarone or Tikosyn and she reported she could not be in the hospital for a Tikosyn admission given she was the caregiver for her husband. She was started on Amiodarone 200mg  BID and followed-up with Roderic Palau, NP in 02/2020 and EKG showed rate-controlled atrial fibrillation. Notes mentioned to plan for DCCV the week of 12/13 and would reduce Amiodarone to 200mg  daily following DCCV. DCCV was scheduled for 02/22/2020 but it appears this was canceled by the patient and she also canceled follow-up with Roderic Palau, NP in 03/2020 and with Dr. Domenic Polite in 04/2020.  In talking with the patient today, she reports still experiencing fatigue along with intermittent palpitations and is interested in scheduling her prior cardioversion. Now has a caregiver for her husband and is able to proceed.  She reports associated dyspnea and dizziness when she has palpitations. No specific chest pain, orthopnea or PND.  She does have lower extremity edema in the setting of varicose veins.  She has been taking  Amiodarone 200 mg daily for the past several months and has also remained on Cardizem CD 300 mg daily and Xarelto 20 mg daily. She reports good compliance with Xarelto and has not missed a dose within the past 3 weeks. She does take PRN Cardizem a few times each week for breakthrough palpitations.   Past Medical History:  Diagnosis Date   Anxiety    Atrial fibrillation [facility])    Atrial fibrillation ablation in 2019 - Dr. Rayann Heman   Chronic diastolic heart failure ([place])    Depression    Essential hypertension    History of TIA (transient ischemic attack)    Hyperlipidemia    Hypothyroidism    Pneumonia     Past Surgical History:  Procedure Laterality Date   ABDOMINAL HYSTERECTOMY     ABLATION OF DYSRHYTHMIC FOCUS  07/06/2017   ATRIAL FIBRILLATION ABLATION N/A 07/06/2017   Procedure: ATRIAL FIBRILLATION ABLATION;  Surgeon: Thompson Grayer, MD;  Location: [facility];  Service: Cardiovascular;  Laterality: N/A;   BREAST SURGERY     biopsy   CARDIOVERSION N/A 01/23/2016   Procedure: CARDIOVERSION;  Surgeon: Satira Sark, MD;  Location: [facility];  Service: Cardiovascular;  Laterality: N/A;   CARDIOVERSION N/A 11/13/2016   Procedure: CARDIOVERSION;  Surgeon: Arnoldo Lenis, MD;  Location: [facility];  Service: Endoscopy;  Laterality: N/A;   CARDIOVERSION N/A 04/20/2018   Procedure: CARDIOVERSION;  Surgeon: [name], MD;  Location: [facility] ENDOSCOPY;  Service: Cardiovascular;  Laterality: N/A;   CHOLECYSTECTOMY  COLONOSCOPY N/A 10/07/2018   Sigmoid and descending colon diverticulosis, three 6 to 15 mm polyps in rectum, ascending colon, and IC valve, one 6 mm polyp in cecum. (sessile serrated polyps and sessile serrated adenoma of ascending colon with focal high grade dysplasia.   coloscopy     ESOPHAGEAL DILATION  10/07/2018   Procedure: ESOPHAGEAL DILATION;  Surgeon: Daneil Dolin, MD;  Location: [facility];  Service: Endoscopy;;    ESOPHAGOGASTRODUODENOSCOPY N/A 10/07/2018   normal esophagus s/p dilation   POLYPECTOMY  10/07/2018   Procedure: POLYPECTOMY;  Surgeon: Daneil Dolin, MD;  Location[facility];  Service: Endoscopy;;  Cecal polyps x 2 hot snare Ascending polyp x 1 Hot snare Rectal polyp x 1   TEE WITHOUT CARDIOVERSION  07/06/2017   TEE WITHOUT CARDIOVERSION N/A 07/06/2017   Procedure: TRANSESOPHAGEAL ECHOCARDIOGRAM (TEE);  Surgeon: Josue Hector, MD;  Location: [facility] ENDOSCOPY;  Service: Cardiovascular;  Laterality: N/A;    Current Medications: Outpatient Medications Prior to Visit  Medication Sig Dispense Refill   acetaminophen (TYLENOL) 650 MG CR tablet Take 1,300 mg by mouth every 8 (eight) hours as needed for pain. Arthritis Strength     amiodarone (PACERONE) 200 MG tablet TAKE 1 TABLET BY MOUTH TWICE DAILY FOR 30 DAYS, THEN REDUCE TO 1 TABLET BY MOUTH EVERY DAY 180 tablet 0   diltiazem (CARDIZEM CD) 300 MG 24 hr capsule TAKE 1 CAPSULE(300 MG) BY MOUTH DAILY 30 capsule 5   diltiazem (CARDIZEM) 30 MG tablet Take 1 tablet (30 mg total) by mouth 4 (four) times daily as needed (for atrial fibrillation). 60 tablet 3   DULoxetine (CYMBALTA) 60 MG capsule Take 1 capsule (60 mg total) by mouth daily. 30 capsule 1   levothyroxine (SYNTHROID) 125 MCG tablet Take 125 mcg by mouth daily before breakfast.      lisinopril (ZESTRIL) 10 MG tablet Take 1 tablet (10 mg total) by mouth daily. 90 tablet 3   magnesium oxide (MAG-OX) 400 MG tablet Take 1 tablet (400 mg total) by mouth daily. 20 tablet 0   metoprolol succinate (TOPROL-XL) 100 MG 24 hr tablet Take 100 mg by mouth daily.     pantoprazole (PROTONIX) 40 MG tablet TAKE 1 TABLET BY MOUTH DAILY 30 MINUTES BEFORE BREAKFAST 30 tablet 3   potassium chloride SA (KLOR-CON) 20 MEQ tablet Take 2 tablets (40 mEq total) by mouth in the morning. & 1 tablet (20 meq total) by mouth in the evening 270 tablet 3   torsemide (DEMADEX) 20 MG tablet Take 3 tablets (60  mg total) by mouth every morning. 90 tablet 3   XARELTO 20 MG TABS tablet TAKE 1 TABLET BY MOUTH EVERY DAY WITH DINNER 30 tablet 11   No facility-administered medications prior to visit.     Allergies:   Levaquin [levofloxacin] and Sulfa antibiotics   Social History   Socioeconomic History   Marital status: Married    Spouse name: Not on file   Number of children: Not on file   Years of education: Not on file   Highest education level: Not on file  Occupational History   Not on file  Tobacco Use   Smoking status: Former Smoker    Packs/day: 0.50    Types: Cigarettes    Quit date: 01/22/1973    Years since quitting: 47.3   Smokeless tobacco: Never Used  Vaping Use   Vaping Use: Never used  Substance and Sexual Activity   Alcohol use: Not Currently  Alcohol/week: 0.0 standard drinks    Comment: 3 times a year: Christmas, Thanksgiving, and maybe just because    Drug use: No   Sexual activity: Yes    Birth control/protection: Surgical  Other Topics Concern   Not on file  Social History Narrative   Lives with spouse in [place] has ALS and is dependant on the patient for transition from bed to chair.   Social Determinants of Health   Financial Resource Strain: Not on file  Food Insecurity: Not on file  Transportation Needs: Not on file  Physical Activity: Not on file  Stress: Not on file  Social Connections: Not on file     Family History:  The patient's family history includes Anemia in her sister; Hypertension in her father, mother, sister, and another family member.   Review of Systems:   Please see the history of present illness.     General:  No chills, fever, night sweats or weight changes. Positive for fatigue.  Cardiovascular:  No chest pain, paroxysmal nocturnal dyspnea. Positive for dyspnea on exertion and palpitations.  Dermatological: No rash, lesions/masses Respiratory: No cough, dyspnea Urologic: No hematuria,  dysuria Abdominal:   No nausea, vomiting, diarrhea, bright red blood per rectum, melena, or hematemesis Neurologic:  No visual changes, wkns, changes in mental status. All other systems reviewed and are otherwise negative except as noted above.   Physical Exam:    VS:  BP (!) 122/58    Pulse 93    Ht 5\' 1"  (1.549 m)    Wt 165 lb 9.6 oz (75.1 kg)    SpO2 97%    BMI 31.29 kg/m    General: Well developed, well nourished,female appearing in no acute distress. Head: Normocephalic, atraumatic. Neck: No carotid bruits. JVD not elevated.  Lungs: Respirations regular and unlabored, without wheezes or rales.  Heart: Irregularly irregular. No S3 or S4.  No murmur, no rubs, or gallops appreciated. Abdomen: Appears non-distended. No obvious abdominal masses. Msk:  Strength and tone appear normal for age. No obvious joint deformities or effusions. Extremities: No clubbing or cyanosis. No lower extremity edema.  Distal pedal pulses are 2+ bilaterally. Neuro: Alert and oriented X 3. Moves all extremities spontaneously. No focal deficits noted. Psych:  Responds to questions appropriately with a normal affect. Skin: No rashes or lesions noted  Wt Readings from Last 3 Encounters:  05/16/20 165 lb 9.6 oz (75.1 kg)  02/08/20 179 lb (81.2 kg)  01/24/20 170 lb 9.6 oz (77.4 kg)     Studies/Labs Reviewed:   EKG:  EKG is ordered today.  The ekg ordered today demonstrates atrial fibrillation, HR 81 with RAD and slight TWI along Lead III.    Recent Labs: 06/23/2019: ALT 22; B Natriuretic Peptide 337.0; Magnesium 2.3; TSH 0.520 02/08/2020: BUN 13; Creatinine, Ser 1.11; Hemoglobin 12.0; Platelets 242; Potassium 4.3; Sodium 137   Lipid Panel No results found for: CHOL, TRIG, HDL, CHOLHDL, VLDL, LDLCALC, LDLDIRECT  Additional studies/ records that were reviewed today include:   Echocardiogram: 11/2019 IMPRESSIONS    1. Left ventricular ejection fraction, by estimation, is 55 to 60%. The  left ventricle  has normal function. The left ventricle has no regional  wall motion abnormalities. Left ventricular diastolic parameters are  indeterminate.  2. Right ventricular systolic function is normal. The right ventricular  size is mildly enlarged.  3. Left atrial size was moderately dilated.  4. Right atrial size was moderately dilated.  5. The mitral valve is normal  in structure. Mild mitral valve  regurgitation. No evidence of mitral stenosis.  6. Tricuspid valve regurgitation is moderate.  7. The aortic valve is tricuspid. Aortic valve regurgitation is not  visualized. No aortic stenosis is present.  8. Moderate pulmonary HTN, PASP is 51 mmHg.  9. The inferior vena cava is normal in size with greater than 50%  respiratory variability, suggesting right atrial pressure of 3 mmHg.   Assessment:    1. Persistent atrial fibrillation ([place])   2. Chronic diastolic heart failure ([place])   3. Essential hypertension   4. Hypothyroidism, unspecified type      Plan:   In order of problems listed above:  1. Persistent Atrial Fibrillation - She is s/p ablation in 2019 and DCCV in 04/2018 with recurrence documented in 06/2019. She has been on Amiodarone for over 3 months and does wish to arrange for cardioversion as she remains in atrial fibrillation today. Risks and benefits of the procedure were reviewed with the patient and she is in agreement to proceed. Will see if this can be arranged for next week with Dr. Domenic Polite. She has not missed any doses of Xarelto within the past 3 weeks and continued compliance with anticoagulation was strongly encouraged. Will continue Cardizem CD 300 mg daily, Toprol-XL 100mg  daily and Amiodarone 200 mg daily for now but she may require dose adjustment following her DCCV.  2. Chronic Diastolic CHF - She denies any specific orthopnea or PND. She does have varicose veins on examination today but no pitting edema. Remains on Torsemide 60 mg daily. Will recheck a  BMET with her pre-procedure labs.   3. HTN - BP is well controlled at 122/58 during today's visit. Continue current medication regimen with Cardizem CD 300 mg daily, Lisinopril 10 mg daily and Toprol-XL 100 mg daily.  4. Hypothyroidism - She remains on Synthroid 125 mcg daily. Will recheck TSH with upcoming pre-procedure labs given the use of Amiodarone.   Medication Adjustments/Labs and Tests Ordered: Current medicines are reviewed at length with the patient today.  Concerns regarding medicines are outlined above.  Medication changes, Labs and Tests ordered today are listed in the Patient Instructions below. Patient Instructions  Medication Instructions:  Your physician recommends that you continue on your current medications as directed. Please refer to the Current Medication list given to you today.  *If you need a refill on your cardiac medications before your next appointment, please call your pharmacy*   Lab Work: CBC BMET  If you have labs (blood work) drawn today and your tests are completely normal, you will receive your results only by:  [place] (if you have MyChart) OR  A paper copy in the mail If you have any lab test that is abnormal or we need to change your treatment, we will call you to review the results.   Testing/Procedures: Your physician has recommended that you have a Cardioversion (DCCV). Electrical Cardioversion uses a jolt of electricity to your heart either through paddles or wired patches attached to your chest. This is a controlled, usually prescheduled, procedure. Defibrillation is done under light anesthesia in the hospital, and you usually go home the day of the procedure. This is done to get your heart back into a normal rhythm. You are not awake for the procedure. Please see the instruction sheet given to you today.     Follow-Up: At [facility], you and your health needs are our priority.  As part of our continuing mission to provide  you  with exceptional heart care, we have created designated Provider Care Teams.  These Care Teams include your primary Cardiologist (physician) and Advanced Practice Providers (APPs -  Physician Assistants and Nurse Practitioners) who all work together to provide you with the care you need, when you need it.  We recommend signing up for the patient portal called "MyChart".  Sign up information is provided on this After Visit Summary.  MyChart is used to connect with patients for Virtual Visits (Telemedicine).  Patients are able to view lab/test results, encounter notes, upcoming appointments, etc.  Non-urgent messages can be sent to your provider as well.   To learn more about what you can do with MyChart, go to [email or web address].    Your next appointment:   Keep follow up with Dr. Myles Gip   Other Instructions       Signed, Erma Heritage, PA-C  05/16/2020 4:36 PM    [facility] [address] [place], [place] [address] Phone: [phone] Fax: [phone]

## 2020-05-16 NOTE — H&P (View-Only) (Signed)
Cardiology Office Note    Date:  05/16/2020   ID:  [name], [name] 1945-06-24, MRN [id number]  PCP:  Celene Squibb, MD  Cardiologist: Rozann Lesches, MD    Chief Complaint  Patient presents with  . Follow-up    Pt wants to arrange DCCV    History of Present Illness:    Diane Cohen is a 75 y.o. female with past medical history of paroxysmal atrial fibrillation (s/p ablation in 2019, DCCV in 04/2018, recurrence in 06/2019), chronic diastolic CHF, HTN, HLD, Hypothyroidism and history of TIA who presents to the office today to arrange for her DCCV.   She was last examined by Dr. 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 She was referred to the [facility] for consideration of antiarrhythmic therapy. Options were reviewed with the patient in regards to Amiodarone or Tikosyn and she reported she could not be in the hospital for a Tikosyn admission given she was the caregiver for her husband. She was started on Amiodarone 200mg  BID and followed-up with Roderic Palau, NP in 02/2020 and EKG showed rate-controlled atrial fibrillation. Notes mentioned to plan for DCCV the week of 12/13 and would reduce Amiodarone to 200mg  daily following DCCV. DCCV was scheduled for 02/22/2020 but it appears this was canceled by the patient and she also canceled follow-up with Roderic Palau, NP in 03/2020 and with Dr. Domenic Polite in 04/2020.  In talking with the patient today, she reports still experiencing fatigue along with intermittent palpitations and is interested in scheduling her prior cardioversion. Now has a caregiver for her husband and is able to proceed.  She reports associated dyspnea and dizziness when she has palpitations. No specific chest pain, orthopnea or PND.  She does have lower extremity edema in the setting of varicose veins.  She has been taking  Amiodarone 200 mg daily for the past several months and has also remained on Cardizem CD 300 mg daily and Xarelto 20 mg daily. She reports good compliance with Xarelto and has not missed a dose within the past 3 weeks. She does take PRN Cardizem a few times each week for breakthrough palpitations.   Past Medical History:  Diagnosis Date  . Anxiety   . Atrial fibrillation (HCC)    Atrial fibrillation ablation in 2019 - Dr. Rayann Heman  . Chronic diastolic heart failure ([place])   . Depression   . Essential hypertension   . History of TIA (transient ischemic attack)   . Hyperlipidemia   . Hypothyroidism   . Pneumonia     Past Surgical History:  Procedure Laterality Date  . ABDOMINAL HYSTERECTOMY    . ABLATION OF DYSRHYTHMIC FOCUS  07/06/2017  . ATRIAL FIBRILLATION ABLATION N/A 07/06/2017   Procedure: ATRIAL FIBRILLATION ABLATION;  Surgeon: Thompson Grayer, MD;  Location: [facility];  Service: Cardiovascular;  Laterality: N/A;  . BREAST SURGERY     biopsy  . CARDIOVERSION N/A 01/23/2016   Procedure: CARDIOVERSION;  Surgeon: Satira Sark, MD;  Location: [facility];  Service: Cardiovascular;  Laterality: N/A;  . CARDIOVERSION N/A 11/13/2016   Procedure: CARDIOVERSION;  Surgeon: Arnoldo Lenis, MD;  Location: [facility];  Service: Endoscopy;  Laterality: N/A;  . CARDIOVERSION N/A 04/20/2018   Procedure: CARDIOVERSION;  Surgeon: [name], MD;  Location: [facility]OSCOPY;  Service: Cardiovascular;  Laterality: N/A;  . CHOLECYSTECTOMY    .  COLONOSCOPY N/A 10/07/2018   Sigmoid and descending colon diverticulosis, three 6 to 15 mm polyps in rectum, ascending colon, and IC valve, one 6 mm polyp in cecum. (sessile serrated polyps and sessile serrated adenoma of ascending colon with focal high grade dysplasia.  . coloscopy    . ESOPHAGEAL DILATION  10/07/2018   Procedure: ESOPHAGEAL DILATION;  Surgeon: Daneil Dolin, MD;  Location: [facility];  Service: Endoscopy;;  .  ESOPHAGOGASTRODUODENOSCOPY N/A 10/07/2018   normal esophagus s/p dilation  . POLYPECTOMY  10/07/2018   Procedure: POLYPECTOMY;  Surgeon: Daneil Dolin, MD;  [facility];  Service: Endoscopy;;  Cecal polyps x 2 hot snare Ascending polyp x 1 Hot snare Rectal polyp x 1  . TEE WITHOUT CARDIOVERSION  07/06/2017  . TEE WITHOUT CARDIOVERSION N/A 07/06/2017   Procedure: TRANSESOPHAGEAL ECHOCARDIOGRAM (TEE);  Surgeon: Josue Hector, MD;  Location: [facility] ENDOSCOPY;  Service: Cardiovascular;  Laterality: N/A;    Current Medications: Outpatient Medications Prior to Visit  Medication Sig Dispense Refill  . acetaminophen (TYLENOL) 650 MG CR tablet Take 1,300 mg by mouth every 8 (eight) hours as needed for pain. Arthritis Strength    . amiodarone (PACERONE) 200 MG tablet TAKE 1 TABLET BY MOUTH TWICE DAILY FOR 30 DAYS, THEN REDUCE TO 1 TABLET BY MOUTH EVERY DAY 180 tablet 0  . diltiazem (CARDIZEM CD) 300 MG 24 hr capsule TAKE 1 CAPSULE(300 MG) BY MOUTH DAILY 30 capsule 5  . diltiazem (CARDIZEM) 30 MG tablet Take 1 tablet (30 mg total) by mouth 4 (four) times daily as needed (for atrial fibrillation). 60 tablet 3  . DULoxetine (CYMBALTA) 60 MG capsule Take 1 capsule (60 mg total) by mouth daily. 30 capsule 1  . levothyroxine (SYNTHROID) 125 MCG tablet Take 125 mcg by mouth daily before breakfast.     . lisinopril (ZESTRIL) 10 MG tablet Take 1 tablet (10 mg total) by mouth daily. 90 tablet 3  . magnesium oxide (MAG-OX) 400 MG tablet Take 1 tablet (400 mg total) by mouth daily. 20 tablet 0  . metoprolol succinate (TOPROL-XL) 100 MG 24 hr tablet Take 100 mg by mouth daily.    . pantoprazole (PROTONIX) 40 MG tablet TAKE 1 TABLET BY MOUTH DAILY 30 MINUTES BEFORE BREAKFAST 30 tablet 3  . potassium chloride SA (KLOR-CON) 20 MEQ tablet Take 2 tablets (40 mEq total) by mouth in the morning. & 1 tablet (20 meq total) by mouth in the evening 270 tablet 3  . torsemide (DEMADEX) 20 MG tablet Take 3 tablets (60  mg total) by mouth every morning. 90 tablet 3  . XARELTO 20 MG TABS tablet TAKE 1 TABLET BY MOUTH EVERY DAY WITH DINNER 30 tablet 11   No facility-administered medications prior to visit.     Allergies:   Levaquin [levofloxacin] and Sulfa antibiotics   Social History   Socioeconomic History  . Marital status: Married    Spouse name: Not on file  . Number of children: Not on file  . Years of education: Not on file  . Highest education level: Not on file  Occupational History  . Not on file  Tobacco Use  . Smoking status: Former Smoker    Packs/day: 0.50    Types: Cigarettes    Quit date: 01/22/1973    Years since quitting: 47.3  . Smokeless tobacco: Never Used  Vaping Use  . Vaping Use: Never used  Substance and Sexual Activity  . Alcohol use: Not Currently  Alcohol/week: 0.0 standard drinks    Comment: 3 times a year: Christmas, Thanksgiving, and maybe just because   . Drug use: No  . Sexual activity: Yes    Birth control/protection: Surgical  Other Topics Concern  . Not on file  Social History Narrative   Lives with spouse in [place] has ALS and is dependant on the patient for transition from bed to chair.   Social Determinants of Health   Financial Resource Strain: Not on file  Food Insecurity: Not on file  Transportation Needs: Not on file  Physical Activity: Not on file  Stress: Not on file  Social Connections: Not on file     Family History:  The patient's family history includes Anemia in her sister; Hypertension in her father, mother, sister, and another family member.   Review of Systems:   Please see the history of present illness.     General:  No chills, fever, night sweats or weight changes. Positive for fatigue.  Cardiovascular:  No chest pain, paroxysmal nocturnal dyspnea. Positive for dyspnea on exertion and palpitations.  Dermatological: No rash, lesions/masses Respiratory: No cough, dyspnea Urologic: No hematuria,  dysuria Abdominal:   No nausea, vomiting, diarrhea, bright red blood per rectum, melena, or hematemesis Neurologic:  No visual changes, wkns, changes in mental status. All other systems reviewed and are otherwise negative except as noted above.   Physical Exam:    VS:  BP (!) 122/58   Pulse 93   Ht 5\' 1"  (1.549 m)   Wt 165 lb 9.6 oz (75.1 kg)   SpO2 97%   BMI 31.29 kg/m    General: Well developed, well nourished,female appearing in no acute distress. Head: Normocephalic, atraumatic. Neck: No carotid bruits. JVD not elevated.  Lungs: Respirations regular and unlabored, without wheezes or rales.  Heart: Irregularly irregular. No S3 or S4.  No murmur, no rubs, or gallops appreciated. Abdomen: Appears non-distended. No obvious abdominal masses. Msk:  Strength and tone appear normal for age. No obvious joint deformities or effusions. Extremities: No clubbing or cyanosis. No lower extremity edema.  Distal pedal pulses are 2+ bilaterally. Neuro: Alert and oriented X 3. Moves all extremities spontaneously. No focal deficits noted. Psych:  Responds to questions appropriately with a normal affect. Skin: No rashes or lesions noted  Wt Readings from Last 3 Encounters:  05/16/20 165 lb 9.6 oz (75.1 kg)  02/08/20 179 lb (81.2 kg)  01/24/20 170 lb 9.6 oz (77.4 kg)     Studies/Labs Reviewed:   EKG:  EKG is ordered today.  The ekg ordered today demonstrates atrial fibrillation, HR 81 with RAD and slight TWI along Lead III.    Recent Labs: 06/23/2019: ALT 22; B Natriuretic Peptide 337.0; Magnesium 2.3; TSH 0.520 02/08/2020: BUN 13; Creatinine, Ser 1.11; Hemoglobin 12.0; Platelets 242; Potassium 4.3; Sodium 137   Lipid Panel No results found for: CHOL, TRIG, HDL, CHOLHDL, VLDL, LDLCALC, LDLDIRECT  Additional studies/ records that were reviewed today include:   Echocardiogram: 11/2019 IMPRESSIONS    1. Left ventricular ejection fraction, by estimation, is 55 to 60%. The  left ventricle  has normal function. The left ventricle has no regional  wall motion abnormalities. Left ventricular diastolic parameters are  indeterminate.  2. Right ventricular systolic function is normal. The right ventricular  size is mildly enlarged.  3. Left atrial size was moderately dilated.  4. Right atrial size was moderately dilated.  5. The mitral valve is normal in structure. Mild mitral valve  regurgitation. No evidence of mitral stenosis.  6. Tricuspid valve regurgitation is moderate.  7. The aortic valve is tricuspid. Aortic valve regurgitation is not  visualized. No aortic stenosis is present.  8. Moderate pulmonary HTN, PASP is 51 mmHg.  9. The inferior vena cava is normal in size with greater than 50%  respiratory variability, suggesting right atrial pressure of 3 mmHg.   Assessment:    1. Persistent atrial fibrillation ([place])   2. Chronic diastolic heart failure ([place])   3. Essential hypertension   4. Hypothyroidism, unspecified type      Plan:   In order of problems listed above:  1. Persistent Atrial Fibrillation - She is s/p ablation in 2019 and DCCV in 04/2018 with recurrence documented in 06/2019. She has been on Amiodarone for over 3 months and does wish to arrange for cardioversion as she remains in atrial fibrillation today. Risks and benefits of the procedure were reviewed with the patient and she is in agreement to proceed. Will see if this can be arranged for next week with Dr. Domenic Polite. She has not missed any doses of Xarelto within the past 3 weeks and continued compliance with anticoagulation was strongly encouraged. Will continue Cardizem CD 300 mg daily, Toprol-XL 100mg  daily and Amiodarone 200 mg daily for now but she may require dose adjustment following her DCCV.  2. Chronic Diastolic CHF - She denies any specific orthopnea or PND. She does have varicose veins on examination today but no pitting edema. Remains on Torsemide 60 mg daily. Will recheck a  BMET with her pre-procedure labs.   3. HTN - BP is well controlled at 122/58 during today's visit. Continue current medication regimen with Cardizem CD 300 mg daily, Lisinopril 10 mg daily and Toprol-XL 100 mg daily.  4. Hypothyroidism - She remains on Synthroid 125 mcg daily. Will recheck TSH with upcoming pre-procedure labs given the use of Amiodarone.   Medication Adjustments/Labs and Tests Ordered: Current medicines are reviewed at length with the patient today.  Concerns regarding medicines are outlined above.  Medication changes, Labs and Tests ordered today are listed in the Patient Instructions below. Patient Instructions  Medication Instructions:  Your physician recommends that you continue on your current medications as directed. Please refer to the Current Medication list given to you today.  *If you need a refill on your cardiac medications before your next appointment, please call your pharmacy*   Lab Work: CBC BMET  If you have labs (blood work) drawn today and your tests are completely normal, you will receive your results only by: Marland Kitchen MyChart Message (if you have MyChart) OR . A paper copy in the mail If you have any lab test that is abnormal or we need to change your treatment, we will call you to review the results.   Testing/Procedures: Your physician has recommended that you have a Cardioversion (DCCV). Electrical Cardioversion uses a jolt of electricity to your heart either through paddles or wired patches attached to your chest. This is a controlled, usually prescheduled, procedure. Defibrillation is done under light anesthesia in the hospital, and you usually go home the day of the procedure. This is done to get your heart back into a normal rhythm. You are not awake for the procedure. Please see the instruction sheet given to you today.     Follow-Up: At [facility], you and your health needs are our priority.  As part of our continuing mission to provide  you with exceptional heart care, we have  created designated Provider Care Teams.  These Care Teams include your primary Cardiologist (physician) and Advanced Practice Providers (APPs -  Physician Assistants and Nurse Practitioners) who all work together to provide you with the care you need, when you need it.  We recommend signing up for the patient portal called "MyChart".  Sign up information is provided on this After Visit Summary.  MyChart is used to connect with patients for Virtual Visits (Telemedicine).  Patients are able to view lab/test results, encounter notes, upcoming appointments, etc.  Non-urgent messages can be sent to your provider as well.   To learn more about what you can do with MyChart, go to [email or web address].    Your next appointment:   Keep follow up with Dr. Myles Gip   Other Instructions       Signed, Erma Heritage, PA-C  05/16/2020 4:36 PM    [facility]. [address] [place], [place] [address] Phone: [phone] Fax: [phone]

## 2020-05-20 NOTE — Patient Instructions (Signed)
Your procedure is scheduled on: 05/23/2020  Report to [facility] Entrance at  8:00   AM.  Call this number if you have problems the morning of surgery: [phone]   Remember:   Do not Eat or Drink after midnight         No Smoking the morning of surgery  :  Take these medicines the morning of surgery with A SIP OF WATER: Amiodarone, Diltiazem, Cymbalta, and Levothyroxine   Do not wear jewelry, make-up or nail polish.  Do not wear lotions, powders, or perfumes. You may wear deodorant.  Do not shave 48 hours prior to surgery. Men may shave face and neck.  Do not bring valuables to the hospital.  Contacts, dentures or bridgework may not be worn into surgery.  Leave suitcase in the car. After surgery it may be brought to your room.  For patients admitted to the hospital, checkout time is 11:00 AM the day of discharge.   Patients discharged the day of surgery will not be allowed to drive home.     Electrical Cardioversion Electrical cardioversion is the delivery of a jolt of electricity to restore a normal rhythm to the heart. A rhythm that is too fast or is not regular keeps the heart from pumping well. In this procedure, sticky patches or metal paddles are placed on the chest to deliver electricity to the heart from a device. This procedure may be done in an emergency if:  There is low or no blood pressure as a result of the heart rhythm.  Normal rhythm must be restored as fast as possible to protect the brain and heart from further damage.  It may save a life. This may also be a scheduled procedure for irregular or fast heart rhythms that are not immediately life-threatening. Tell a health care provider about:  Any allergies you have.  All medicines you are taking, including vitamins, herbs, eye drops, creams, and over-the-counter medicines.  Any problems you or family members have had with anesthetic medicines.  Any blood disorders you have.  Any surgeries you have  had.  Any medical conditions you have.  Whether you are pregnant or may be pregnant. What are the risks? Generally, this is a safe procedure. However, problems may occur, including:  Allergic reactions to medicines.  A blood clot that breaks free and travels to other parts of your body.  The possible return of an abnormal heart rhythm within hours or days after the procedure.  Your heart stopping (cardiac arrest). This is rare. What happens before the procedure? Medicines  Your health care provider may have you start taking: ? Blood-thinning medicines (anticoagulants) so your blood does not clot as easily. ? Medicines to help stabilize your heart rate and rhythm.  Ask your health care provider about: ? Changing or stopping your regular medicines. This is especially important if you are taking diabetes medicines or blood thinners. ? Taking medicines such as aspirin and ibuprofen. These medicines can thin your blood. Do not take these medicines unless your health care provider tells you to take them. ? Taking over-the-counter medicines, vitamins, herbs, and supplements. General instructions  Follow instructions from your health care provider about eating or drinking restrictions.  Plan to have someone take you home from the hospital or clinic.  If you will be going home right after the procedure, plan to have someone with you for 24 hours.  Ask your health care provider what steps will be taken to help prevent  infection. These may include washing your skin with a germ-killing soap. What happens during the procedure?  An IV will be inserted into one of your veins.  Sticky patches (electrodes) or metal paddles may be placed on your chest.  You will be given a medicine to help you relax (sedative).  An electrical shock will be delivered. The procedure may vary among health care providers and hospitals.   What can I expect after the procedure?  Your blood pressure, heart  rate, breathing rate, and blood oxygen level will be monitored until you leave the hospital or clinic.  Your heart rhythm will be watched to make sure it does not change.  You may have some redness on the skin where the shocks were given. Follow these instructions at home:  Do not drive for 24 hours if you were given a sedative during your procedure.  Take over-the-counter and prescription medicines only as told by your health care provider.  Ask your health care provider how to check your pulse. Check it often.  Rest for 48 hours after the procedure or as told by your health care provider.  Avoid or limit your caffeine use as told by your health care provider.  Keep all follow-up visits as told by your health care provider. This is important. Contact a health care provider if:  You feel like your heart is beating too quickly or your pulse is not regular.  You have a serious muscle cramp that does not go away. Get help right away if:  You have discomfort in your chest.  You are dizzy or you feel faint.  You have trouble breathing or you are short of breath.  Your speech is slurred.  You have trouble moving an arm or leg on one side of your body.  Your fingers or toes turn cold or blue. Summary  Electrical cardioversion is the delivery of a jolt of electricity to restore a normal rhythm to the heart.  This procedure may be done right away in an emergency or may be a scheduled procedure if the condition is not an emergency.  Generally, this is a safe procedure.  After the procedure, check your pulse often as told by your health care provider. This information is not intended to replace advice given to you by your health care provider. Make sure you discuss any questions you have with your health care provider. Document Revised: 09/26/2018 Document Reviewed: 09/26/2018 Elsevier Patient Education  2021 [place] After This sheet  gives you information about how to care for yourself after your procedure. Your health care provider may also give you more specific instructions. If you have problems or questions, contact your health care provider. What can I expect after the procedure? After the procedure, it is common to have:  Tiredness.  Forgetfulness about what happened after the procedure.  Impaired judgment for important decisions.  Nausea or vomiting.  Some difficulty with balance. Follow these instructions at home: For the time period you were told by your health care provider:  Rest as needed.  Do not participate in activities where you could fall or become injured.  Do not drive or use machinery.  Do not drink alcohol.  Do not take sleeping pills or medicines that cause drowsiness.  Do not make important decisions or sign legal documents.  Do not take care of children on your own.      Eating and drinking  Follow the diet that is recommended  by your health care provider.  Drink enough fluid to keep your urine pale yellow.  If you vomit: ? Drink water, juice, or soup when you can drink without vomiting. ? Make sure you have little or no nausea before eating solid foods. General instructions  Have a responsible adult stay with you for the time you are told. It is important to have someone help care for you until you are awake and alert.  Take over-the-counter and prescription medicines only as told by your health care provider.  If you have sleep apnea, surgery and certain medicines can increase your risk for breathing problems. Follow instructions from your health care provider about wearing your sleep device: ? Anytime you are sleeping, including during daytime naps. ? While taking prescription pain medicines, sleeping medicines, or medicines that make you drowsy.  Avoid smoking.  Keep all follow-up visits as told by your health care provider. This is important. Contact a health care  provider if:  You keep feeling nauseous or you keep vomiting.  You feel light-headed.  You are still sleepy or having trouble with balance after 24 hours.  You develop a rash.  You have a fever.  You have redness or swelling around the IV site. Get help right away if:  You have trouble breathing.  You have new-onset confusion at home. Summary  For several hours after your procedure, you may feel tired. You may also be forgetful and have poor judgment.  Have a responsible adult stay with you for the time you are told. It is important to have someone help care for you until you are awake and alert.  Rest as told. Do not drive or operate machinery. Do not drink alcohol or take sleeping pills.  Get help right away if you have trouble breathing, or if you suddenly become confused. This information is not intended to replace advice given to you by your health care provider. Make sure you discuss any questions you have with your health care provider. Document Revised: 11/09/2019 Document Reviewed: 01/26/2019 Elsevier Patient Education  2021 [employer].

## 2020-05-21 ENCOUNTER — Other Ambulatory Visit: Payer: Self-pay

## 2020-05-21 ENCOUNTER — Encounter (HOSPITAL_COMMUNITY): Payer: Self-pay

## 2020-05-21 ENCOUNTER — Other Ambulatory Visit (HOSPITAL_COMMUNITY)
Admission: RE | Admit: 2020-05-21 | Discharge: 2020-05-21 | Disposition: A | Discharge status: Home / Self Care | Payer: Medicare HMO | Source: Ambulatory Visit | Attending: Cardiology | Admitting: Cardiology

## 2020-05-21 ENCOUNTER — Encounter (HOSPITAL_COMMUNITY)
Admission: RE | Admit: 2020-05-21 | Discharge: 2020-05-21 | Disposition: A | Discharge status: Home / Self Care | Payer: Medicare HMO | Source: Ambulatory Visit | Attending: Cardiology | Admitting: Cardiology

## 2020-05-21 DIAGNOSIS — Z01812 Encounter for preprocedural laboratory examination: Secondary | ICD-10-CM | POA: Diagnosis present

## 2020-05-21 DIAGNOSIS — Z20822 Contact with and (suspected) exposure to covid-19: Secondary | ICD-10-CM | POA: Insufficient documentation

## 2020-05-21 LAB — CBC WITH DIFFERENTIAL/PLATELET
Abs Immature Granulocytes: 0.02 10*3/uL (ref 0.00–0.07)
Basophils Absolute: 0 10*3/uL (ref 0.0–0.1)
Basophils Relative: 1 %
Eosinophils Absolute: 0.1 10*3/uL (ref 0.0–0.5)
Eosinophils Relative: 3 %
HCT: 36.6 % (ref 36.0–46.0)
Hemoglobin: 11.3 g/dL — ABNORMAL LOW (ref 12.0–15.0)
Immature Granulocytes: 1 %
Lymphocytes Relative: 29 %
Lymphs Abs: 1.3 10*3/uL (ref 0.7–4.0)
MCH: 31 pg (ref 26.0–34.0)
MCHC: 30.9 g/dL (ref 30.0–36.0)
MCV: 100.3 fL — ABNORMAL HIGH (ref 80.0–100.0)
Monocytes Absolute: 0.4 10*3/uL (ref 0.1–1.0)
Monocytes Relative: 8 %
Neutro Abs: 2.6 10*3/uL (ref 1.7–7.7)
Neutrophils Relative %: 58 %
Platelets: 291 10*3/uL (ref 150–400)
RBC: 3.65 MIL/uL — ABNORMAL LOW (ref 3.87–5.11)
RDW: 16 % — ABNORMAL HIGH (ref 11.5–15.5)
WBC: 4.4 10*3/uL (ref 4.0–10.5)
nRBC: 0 % (ref 0.0–0.2)

## 2020-05-21 LAB — BASIC METABOLIC PANEL
Anion gap: 10 (ref 5–15)
BUN: 20 mg/dL (ref 8–23)
CO2: 26 mmol/L (ref 22–32)
Calcium: 10 mg/dL (ref 8.9–10.3)
Chloride: 98 mmol/L (ref 98–111)
Creatinine, Ser: 1.09 mg/dL — ABNORMAL HIGH (ref 0.44–1.00)
GFR, Estimated: 53 mL/min — ABNORMAL LOW (ref >60)
Glucose, Bld: 84 mg/dL (ref 70–99)
Potassium: 3.9 mmol/L (ref 3.5–5.1)
Sodium: 134 mmol/L — ABNORMAL LOW (ref 135–145)

## 2020-05-21 LAB — TSH: TSH: 6.239 u[IU]/mL — ABNORMAL HIGH (ref 0.350–4.500)

## 2020-05-21 LAB — PROTIME-INR
INR: 2.2 — ABNORMAL HIGH (ref 0.8–1.2)
Prothrombin Time: 23.3 seconds — ABNORMAL HIGH (ref 11.4–15.2)

## 2020-05-22 ENCOUNTER — Telehealth: Payer: Self-pay | Admitting: *Deleted

## 2020-05-22 LAB — SARS CORONAVIRUS 2 (TAT 6-24 HRS): SARS Coronavirus 2: NEGATIVE

## 2020-05-22 NOTE — Telephone Encounter (Signed)
-----   Message from Satira Sark, MD sent at 05/21/2020  4:28 PM EDT ----- Results reviewed.  TSH elevated at 6.239, was normal 1 year ago, but in similar range for years ago.  Amiodarone has been cut back.  We will continue to follow.

## 2020-05-22 NOTE — Telephone Encounter (Signed)
Patient informed. Copy sent to PCP °

## 2020-05-23 ENCOUNTER — Ambulatory Visit (HOSPITAL_COMMUNITY)
Admission: RE | Admit: 2020-05-23 | Discharge: 2020-05-23 | Disposition: A | Discharge status: Home / Self Care | Payer: Medicare HMO | Attending: Cardiology | Admitting: Cardiology

## 2020-05-23 ENCOUNTER — Ambulatory Visit (HOSPITAL_COMMUNITY): Payer: Medicare HMO | Admitting: Anesthesiology

## 2020-05-23 ENCOUNTER — Encounter (HOSPITAL_COMMUNITY)
Admission: RE | Disposition: A | Discharge status: Home / Self Care | Payer: Self-pay | Source: Home / Self Care | Attending: Cardiology

## 2020-05-23 ENCOUNTER — Other Ambulatory Visit: Payer: Self-pay

## 2020-05-23 ENCOUNTER — Encounter (HOSPITAL_COMMUNITY): Payer: Self-pay | Admitting: Cardiology

## 2020-05-23 DIAGNOSIS — Z881 Allergy status to other antibiotic agents status: Secondary | ICD-10-CM | POA: Diagnosis not present

## 2020-05-23 DIAGNOSIS — I110 Hypertensive heart disease with heart failure: Secondary | ICD-10-CM | POA: Diagnosis not present

## 2020-05-23 DIAGNOSIS — Z79899 Other long term (current) drug therapy: Secondary | ICD-10-CM | POA: Diagnosis not present

## 2020-05-23 DIAGNOSIS — E039 Hypothyroidism, unspecified: Secondary | ICD-10-CM | POA: Diagnosis not present

## 2020-05-23 DIAGNOSIS — I480 Paroxysmal atrial fibrillation: Secondary | ICD-10-CM | POA: Diagnosis not present

## 2020-05-23 DIAGNOSIS — Z7901 Long term (current) use of anticoagulants: Secondary | ICD-10-CM | POA: Diagnosis not present

## 2020-05-23 DIAGNOSIS — Z882 Allergy status to sulfonamides status: Secondary | ICD-10-CM | POA: Diagnosis not present

## 2020-05-23 DIAGNOSIS — I491 Atrial premature depolarization: Secondary | ICD-10-CM | POA: Diagnosis not present

## 2020-05-23 DIAGNOSIS — Z87891 Personal history of nicotine dependence: Secondary | ICD-10-CM | POA: Diagnosis not present

## 2020-05-23 DIAGNOSIS — I272 Pulmonary hypertension, unspecified: Secondary | ICD-10-CM | POA: Diagnosis not present

## 2020-05-23 DIAGNOSIS — Z8249 Family history of ischemic heart disease and other diseases of the circulatory system: Secondary | ICD-10-CM | POA: Diagnosis not present

## 2020-05-23 DIAGNOSIS — Z8673 Personal history of transient ischemic attack (TIA), and cerebral infarction without residual deficits: Secondary | ICD-10-CM | POA: Insufficient documentation

## 2020-05-23 DIAGNOSIS — Z79890 Hormone replacement therapy (postmenopausal): Secondary | ICD-10-CM | POA: Insufficient documentation

## 2020-05-23 DIAGNOSIS — I5032 Chronic diastolic (congestive) heart failure: Secondary | ICD-10-CM | POA: Diagnosis not present

## 2020-05-23 DIAGNOSIS — I4819 Other persistent atrial fibrillation: Secondary | ICD-10-CM

## 2020-05-23 HISTORY — PX: CARDIOVERSION: SHX1299

## 2020-05-23 SURGERY — CARDIOVERSION
Anesthesia: General

## 2020-05-23 MED ORDER — PROPOFOL 10 MG/ML IV BOLUS
INTRAVENOUS | Status: DC | PRN
  Administered 2020-05-23: 60 mg via INTRAVENOUS

## 2020-05-23 MED ORDER — METOPROLOL SUCCINATE ER 100 MG PO TB24: 50.0000 mg | ORAL_TABLET | Freq: Every day | ORAL | 3 refills | Status: DC

## 2020-05-23 MED ORDER — LIDOCAINE HCL (PF) 2 % IJ SOLN
INTRAMUSCULAR | Status: AC
  Filled 2020-05-23: qty 5

## 2020-05-23 MED ORDER — LACTATED RINGERS IV SOLN: INTRAVENOUS | Status: DC

## 2020-05-23 MED ORDER — LIDOCAINE HCL (CARDIAC) PF 100 MG/5ML IV SOSY
PREFILLED_SYRINGE | INTRAVENOUS | Status: DC | PRN
  Administered 2020-05-23: 80 mg via INTRAVENOUS

## 2020-05-23 MED ORDER — PROPOFOL 10 MG/ML IV BOLUS
INTRAVENOUS | Status: AC
  Filled 2020-05-23: qty 40

## 2020-05-23 NOTE — CV Procedure (Signed)
Elective direct-current cardioversion  Indication: Persistent atrial fibrillation  Description of procedure: After informed consent was obtained, the patient was taken to the PACU where a timeout was performed.  Anterior and posterior pads were placed in standard fashion and connected to a biphasic defibrillator.  Deep sedation was achieved via propofol with administration and management by the Anesthesia team.  With sandbag placed on anterior chest pad, a single synchronized 150 J shock was delivered with successful restoration of sinus rhythm.  Patient remained hemodynamically stable throughout and there were no immediate complications.  Post procedure rhythm is sinus with PACs by ECG.  Disposition: Patient will be discharged home after appropriate observation.  Continue current medical regimen including Xarelto, amiodarone, Cardizem CD, and reduced dose Toprol-XL 50 mg daily.  Office follow-up will be arranged.  Satira Sark, M.D., F.A.C.C.

## 2020-05-23 NOTE — Anesthesia Preprocedure Evaluation (Signed)
Anesthesia Evaluation  Patient identified by MRN, date of birth, ID band Patient awake    Reviewed: Allergy & Precautions, NPO status , Patient's Chart, lab work & pertinent test results  Airway Mallampati: II  TM Distance: >3 FB Neck ROM: Full    Dental  (+) Dental Advisory Given, Caps   Pulmonary pneumonia, former smoker,    Pulmonary exam normal breath sounds clear to auscultation       Cardiovascular hypertension, Pt. on medications +CHF  + dysrhythmias Atrial Fibrillation  Rhythm:Irregular Rate:Tachycardia  Results reviewed.  Please let her know that cardiac function remains normal, LVEF 55 to 60%.  Left atrium moderately dilated.  Also moderately elevated pulmonary pressures with mildly dilated right ventricle.  Continue with current plan, diuretics were just increased   Neuro/Psych PSYCHIATRIC DISORDERS Anxiety Depression TIA   GI/Hepatic Neg liver ROS, hiatal hernia, GERD  Controlled,  Endo/Other  Hypothyroidism   Renal/GU negative Renal ROS     Musculoskeletal  (+) Arthritis  (back pain),   Abdominal   Peds  Hematology  (+) anemia ,   Anesthesia Other Findings   Reproductive/Obstetrics                             Anesthesia Physical Anesthesia Plan  ASA: III  Anesthesia Plan: General   Post-op Pain Management:    Induction: Intravenous  PONV Risk Score and Plan:   Airway Management Planned: Nasal Cannula and Natural Airway  Additional Equipment:   Intra-op Plan:   Post-operative Plan:   Informed Consent: I have reviewed the patients History and Physical, chart, labs and discussed the procedure including the risks, benefits and alternatives for the proposed anesthesia with the patient or authorized representative who has indicated his/her understanding and acceptance.     Dental advisory given  Plan Discussed with: CRNA and Surgeon  Anesthesia Plan Comments:          Anesthesia Quick Evaluation

## 2020-05-23 NOTE — Anesthesia Postprocedure Evaluation (Signed)
Anesthesia Post Note  Patient: Diane Cohen  Procedure(s) Performed: CARDIOVERSION (N/A )  Patient location during evaluation: PACU Anesthesia Type: General Level of consciousness: awake and alert and oriented Pain management: pain level controlled Vital Signs Assessment: post-procedure vital signs reviewed and stable Respiratory status: spontaneous breathing and respiratory function stable Cardiovascular status: blood pressure returned to baseline and stable Postop Assessment: no apparent nausea or vomiting Anesthetic complications: no   No complications documented.   Last Vitals:  Vitals:   05/23/20 1030 05/23/20 1045  BP: (!) 147/86 (!) 142/70  Pulse: 67 70  Resp: 20 20  Temp:  36.5 C  SpO2: 98% 97%    Last Pain:  Vitals:   05/23/20 1045  TempSrc: Oral  PainSc: 0-No pain                 [name]

## 2020-05-23 NOTE — Interval H&P Note (Signed)
History and Physical Interval Note:  05/23/2020 9:20 AM  Diane Cohen  has presented today for surgery, with the diagnosis of a-fib.  The various methods of treatment have been discussed with the patient and family. After consideration of risks, benefits and other options for treatment, the patient has consented to  Procedure(s): CARDIOVERSION (N/A) as a surgical intervention.  The patient's history has been reviewed, patient examined, no change in status, stable for surgery.  I have reviewed the patient's chart and labs.  Questions were answered to the patient's satisfaction.     Rozann Lesches

## 2020-05-23 NOTE — Transfer of Care (Signed)
Immediate Anesthesia Transfer of Care Note  Patient: Diane Cohen  Procedure(s) Performed: CARDIOVERSION (N/A )  Patient Location: PACU  Anesthesia Type:MAC  Level of Consciousness: awake, alert  and oriented  Airway & Oxygen Therapy: Patient Spontanous Breathing and Patient connected to nasal cannula oxygen  Post-op Assessment: Report given to RN and Post -op Vital signs reviewed and stable  Post vital signs: Reviewed and stable  Last Vitals:  Vitals Value Taken Time  BP 101/61 05/23/20 0933  Temp    Pulse 63 05/23/20 0936  Resp 25 05/23/20 0936  SpO2 97 % 05/23/20 0936    Last Pain:  Vitals:   05/23/20 0829  PainSc: 5       Patients Stated Pain Goal: 7 (92/49/32 4199)  Complications: No complications documented.

## 2020-05-23 NOTE — Anesthesia Procedure Notes (Signed)
Procedure Name: MAC Date/Time: 05/23/2020 9:17 AM Performed by: Alain Marion, CRNA Pre-anesthesia Checklist: Patient identified, Emergency Drugs available, Suction available and Patient being monitored Patient Re-evaluated:Patient Re-evaluated prior to induction Oxygen Delivery Method: Nasal cannula Placement Confirmation: positive ETCO2

## 2020-05-23 NOTE — Progress Notes (Signed)
Electrical Cardioversion Procedure Note Diane Cohen [id number] Oct 22, 1945  Procedure: Electrical Cardioversion Indications: A fib  Procedure Details Consent: direct current cardioversion Time Out: Verified patient identification, verified procedure, site/side was marked, verified correct patient position, special equipment/implants available, medications/allergies/relevent history reviewed, required imaging and test results available time out preformed 0927 Patient placed on cardiac monitor, pulse oximetry, supplemental oxygen as necessary.  Sedation given: per CRNA Pacer pads placed anterior/posterior  Cardioverted 1 time(s).  Cardioverted at  150J Evaluation Findings: Post procedure EKG shows: NSR Complications: none. Patient did tolerate procedure well.   [name] 05/23/2020, 10:00 AM

## 2020-05-23 NOTE — Progress Notes (Signed)
Patient in post op ready for discharge and c/o increased shortness of breath, (1100) Placed on cardiac monitor rhythm appears to be atrial flutter with HR of 70 O2 sats 95% on room air and BP 140/72, EKG (1109) obtained showing PAC's sinus rhythm, rhythm on monitor appears now to be sinus rhythm with rate of 68. Dr.McDowell notified and updated with patient status. 1120 Patient states she is feeling better shortness has resolved does have +2 edema noted in lower extremities has not had fluid pill today. Dr. Domenic Polite states patient can continue with discharge and discharge plan.

## 2020-05-23 NOTE — Discharge Instructions (Signed)
Electrical Cardioversion Electrical cardioversion is the delivery of a jolt of electricity to restore a normal rhythm to the heart. A rhythm that is too fast or is not regular keeps the heart from pumping well. In this procedure, sticky patches or metal paddles are placed on the chest to deliver electricity to the heart from a device. This procedure may be done in an emergency if:  There is low or no blood pressure as a result of the heart rhythm.  Normal rhythm must be restored as fast as possible to protect the brain and heart from further damage.  It may save a life. This may also be a scheduled procedure for irregular or fast heart rhythms that are not immediately life-threatening. Tell a health care provider about:  Any allergies you have.  All medicines you are taking, including vitamins, herbs, eye drops, creams, and over-the-counter medicines.  Any problems you or family members have had with anesthetic medicines.  Any blood disorders you have.  Any surgeries you have had.  Any medical conditions you have.  Whether you are pregnant or may be pregnant. What are the risks? Generally, this is a safe procedure. However, problems may occur, including:  Allergic reactions to medicines.  A blood clot that breaks free and travels to other parts of your body.  The possible return of an abnormal heart rhythm within hours or days after the procedure.  Your heart stopping (cardiac arrest). This is rare. What happens before the procedure? Medicines  Your health care provider may have you start taking: ? Blood-thinning medicines (anticoagulants) so your blood does not clot as easily. ? Medicines to help stabilize your heart rate and rhythm.  Ask your health care provider about: ? Changing or stopping your regular medicines. This is especially important if you are taking diabetes medicines or blood thinners. ? Taking medicines such as aspirin and ibuprofen. These medicines can  thin your blood. Do not take these medicines unless your health care provider tells you to take them. ? Taking over-the-counter medicines, vitamins, herbs, and supplements. General instructions  Follow instructions from your health care provider about eating or drinking restrictions.  Plan to have someone take you home from the hospital or clinic.  If you will be going home right after the procedure, plan to have someone with you for 24 hours.  Ask your health care provider what steps will be taken to help prevent infection. These may include washing your skin with a germ-killing soap. What happens during the procedure?  An IV will be inserted into one of your veins.  Sticky patches (electrodes) or metal paddles may be placed on your chest.  You will be given a medicine to help you relax (sedative).  An electrical shock will be delivered. The procedure may vary among health care providers and hospitals.   What can I expect after the procedure?  Your blood pressure, heart rate, breathing rate, and blood oxygen level will be monitored until you leave the hospital or clinic.  Your heart rhythm will be watched to make sure it does not change.  You may have some redness on the skin where the shocks were given. Follow these instructions at home:  Do not drive for 24 hours if you were given a sedative during your procedure.  Take over-the-counter and prescription medicines only as told by your health care provider.  Ask your health care provider how to check your pulse. Check it often.  Rest for 48 hours after the procedure   or as told by your health care provider.  Avoid or limit your caffeine use as told by your health care provider.  Keep all follow-up visits as told by your health care provider. This is important. Contact a health care provider if:  You feel like your heart is beating too quickly or your pulse is not regular.  You have a serious muscle cramp that does not go  away. Get help right away if:  You have discomfort in your chest.  You are dizzy or you feel faint.  You have trouble breathing or you are short of breath.  Your speech is slurred.  You have trouble moving an arm or leg on one side of your body.  Your fingers or toes turn cold or blue. Summary  Electrical cardioversion is the delivery of a jolt of electricity to restore a normal rhythm to the heart.  This procedure may be done right away in an emergency or may be a scheduled procedure if the condition is not an emergency.  Generally, this is a safe procedure.  After the procedure, check your pulse often as told by your health care provider. This information is not intended to replace advice given to you by your health care provider. Make sure you discuss any questions you have with your health care provider. Document Revised: 09/26/2018 Document Reviewed: 09/26/2018 Elsevier Patient Education  2021 Elsevier Inc.         Monitored Anesthesia Care, Care After This sheet gives you information about how to care for yourself after your procedure. Your health care provider may also give you more specific instructions. If you have problems or questions, contact your health care provider. What can I expect after the procedure? After the procedure, it is common to have:  Tiredness.  Forgetfulness about what happened after the procedure.  Impaired judgment for important decisions.  Nausea or vomiting.  Some difficulty with balance. Follow these instructions at home: For the time period you were told by your health care provider:  Rest as needed.  Do not participate in activities where you could fall or become injured.  Do not drive or use machinery.  Do not drink alcohol.  Do not take sleeping pills or medicines that cause drowsiness.  Do not make important decisions or sign legal documents.  Do not take care of children on your own.      Eating and  drinking  Follow the diet that is recommended by your health care provider.  Drink enough fluid to keep your urine pale yellow.  If you vomit: ? Drink water, juice, or soup when you can drink without vomiting. ? Make sure you have little or no nausea before eating solid foods. General instructions  Have a responsible adult stay with you for the time you are told. It is important to have someone help care for you until you are awake and alert.  Take over-the-counter and prescription medicines only as told by your health care provider.  If you have sleep apnea, surgery and certain medicines can increase your risk for breathing problems. Follow instructions from your health care provider about wearing your sleep device: ? Anytime you are sleeping, including during daytime naps. ? While taking prescription pain medicines, sleeping medicines, or medicines that make you drowsy.  Avoid smoking.  Keep all follow-up visits as told by your health care provider. This is important. Contact a health care provider if:  You keep feeling nauseous or you keep vomiting.  You feel   light-headed.  You are still sleepy or having trouble with balance after 24 hours.  You develop a rash.  You have a fever.  You have redness or swelling around the IV site. Get help right away if:  You have trouble breathing.  You have new-onset confusion at home. Summary  For several hours after your procedure, you may feel tired. You may also be forgetful and have poor judgment.  Have a responsible adult stay with you for the time you are told. It is important to have someone help care for you until you are awake and alert.  Rest as told. Do not drive or operate machinery. Do not drink alcohol or take sleeping pills.  Get help right away if you have trouble breathing, or if you suddenly become confused. This information is not intended to replace advice given to you by your health care provider. Make sure  you discuss any questions you have with your health care provider. Document Revised: 11/09/2019 Document Reviewed: 01/26/2019 Elsevier Patient Education  2021 Elsevier Inc.     

## 2020-05-29 ENCOUNTER — Encounter (HOSPITAL_COMMUNITY): Payer: Self-pay | Admitting: Cardiology

## 2020-06-13 ENCOUNTER — Ambulatory Visit (INDEPENDENT_AMBULATORY_CARE_PROVIDER_SITE_OTHER): Payer: Medicare HMO | Admitting: Cardiology

## 2020-06-13 ENCOUNTER — Other Ambulatory Visit: Payer: Self-pay

## 2020-06-13 ENCOUNTER — Encounter: Payer: Self-pay | Admitting: Cardiology

## 2020-06-13 VITALS — BP 124/62 | HR 75 | Ht 61.0 in | Wt 163.0 lb

## 2020-06-13 DIAGNOSIS — I5032 Chronic diastolic (congestive) heart failure: Secondary | ICD-10-CM

## 2020-06-13 DIAGNOSIS — I4819 Other persistent atrial fibrillation: Secondary | ICD-10-CM

## 2020-06-13 DIAGNOSIS — D6869 Other thrombophilia: Secondary | ICD-10-CM | POA: Diagnosis not present

## 2020-06-13 NOTE — Progress Notes (Signed)
Cardiology Office Note  Date: 06/13/2020   ID: Maris, Abascal 1945-12-14, MRN [id number]  PCP:  Celene Squibb, MD  Cardiologist:  Rozann Lesches, MD Electrophysiologist:  Thompson Grayer, MD   Chief Complaint  Patient presents with  . Cardiac follow-up    History of Present Illness: Diane Cohen is a 75 y.o. female last seen in March by Ms. Strader PA-C.  She underwent successful cardioversion on March 17 and presents for follow-up.  She remains in sinus rhythm by ECG today which I personally reviewed.  She states that she remains fatigued, I think a lot of this is multifactorial.  Unclear at least as yet that conversion to sinus rhythm has made a tremendous difference.  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  She does now have an assistant that helps out about 15 hours a week.  We went over her medications.  Heart rate on current regimen is in the 70s in sinus rhythm.  No changes anticipated at this time.  Past Medical History:  Diagnosis Date  . Anxiety   . Atrial fibrillation (HCC)    Atrial fibrillation ablation in 2019 - Dr. Rayann Heman  . Chronic diastolic heart failure ([place])   . Depression   . Essential hypertension   . History of TIA (transient ischemic attack)   . Hyperlipidemia   . Hypothyroidism   . Pneumonia     Past Surgical History:  Procedure Laterality Date  . ABDOMINAL HYSTERECTOMY    . ABLATION OF DYSRHYTHMIC FOCUS  07/06/2017  . ATRIAL FIBRILLATION ABLATION N/A 07/06/2017   Procedure: ATRIAL FIBRILLATION ABLATION;  Surgeon: Thompson Grayer, MD;  Location: [facility];  Service: Cardiovascular;  Laterality: N/A;  . BREAST SURGERY     biopsy  . CARDIOVERSION N/A 01/23/2016   Procedure: CARDIOVERSION;  Surgeon: Satira Sark, MD;  [facility];  Service: Cardiovascular;  Laterality: N/A;  . CARDIOVERSION N/A 11/13/2016   Procedure: CARDIOVERSION;  Surgeon: Arnoldo Lenis, MD;  Location: [facility];  Service: Endoscopy;   Laterality: N/A;  . CARDIOVERSION N/A 04/20/2018   Procedure: CARDIOVERSION;  Surgeon: [name], MD;  Location[facility] ENDOSCOPY;  Service: Cardiovascular;  Laterality: N/A;  . CARDIOVERSION N/A 05/23/2020   Procedure: CARDIOVERSION;  Surgeon: Satira Sark, MD;  Location: [facility];  Service: Cardiovascular;  Laterality: N/A;  . CHOLECYSTECTOMY    . COLONOSCOPY N/A 10/07/2018   Sigmoid and descending colon diverticulosis, three 6 to 15 mm polyps in rectum, ascending colon, and IC valve, one 6 mm polyp in cecum. (sessile serrated polyps and sessile serrated adenoma of ascending colon with focal high grade dysplasia.  . coloscopy    . ESOPHAGEAL DILATION  10/07/2018   Procedure: ESOPHAGEAL DILATION;  Surgeon: Daneil Dolin, MD;  Location: [facility];  Service: Endoscopy;;  . ESOPHAGOGASTRODUODENOSCOPY N/A 10/07/2018   normal esophagus s/p dilation  . POLYPECTOMY  10/07/2018   Procedure: POLYPECTOMY;  Surgeon: Daneil Dolin, MD;  Location: [facility];  Service: Endoscopy;;  Cecal polyps x 2 hot snare Ascending polyp x 1 Hot snare Rectal polyp x 1  . TEE WITHOUT CARDIOVERSION  07/06/2017  . TEE WITHOUT CARDIOVERSION N/A 07/06/2017   Procedure: TRANSESOPHAGEAL ECHOCARDIOGRAM (TEE);  Surgeon: Josue Hector, MD;  Location: [facility] ENDOSCOPY;  Service: Cardiovascular;  Laterality: N/A;    Current Outpatient Medications  Medication Sig Dispense Refill  . acetaminophen (TYLENOL) 650 MG CR tablet Take 1,300 mg by  mouth every 8 (eight) hours as needed for pain. Arthritis Strength    . amiodarone (PACERONE) 200 MG tablet TAKE 1 TABLET BY MOUTH TWICE DAILY FOR 30 DAYS, THEN REDUCE TO 1 TABLET BY MOUTH EVERY DAY (Patient taking differently: Take 200 mg by mouth daily.) 180 tablet 0  . diltiazem (CARDIZEM CD) 300 MG 24 hr capsule TAKE 1 CAPSULE(300 MG) BY MOUTH DAILY (Patient taking differently: Take 300 mg by mouth daily.) 30 capsule 5  . diltiazem (CARDIZEM) 30 MG tablet Take 1 tablet  (30 mg total) by mouth 4 (four) times daily as needed (for atrial fibrillation). 60 tablet 3  . DULoxetine (CYMBALTA) 60 MG capsule Take 1 capsule (60 mg total) by mouth daily. (Patient taking differently: Take by mouth daily.) 30 capsule 1  . levothyroxine (SYNTHROID) 125 MCG tablet Take 125 mcg by mouth daily before breakfast.     . lisinopril (ZESTRIL) 10 MG tablet Take 1 tablet (10 mg total) by mouth daily. 90 tablet 3  . magnesium oxide (MAG-OX) 400 MG tablet Take 1 tablet (400 mg total) by mouth daily. 20 tablet 0  . metoprolol succinate (TOPROL-XL) 100 MG 24 hr tablet Take 0.5 tablets (50 mg total) by mouth daily. 30 tablet 3  . pantoprazole (PROTONIX) 40 MG tablet TAKE 1 TABLET BY MOUTH DAILY 30 MINUTES BEFORE BREAKFAST (Patient taking differently: Take 40 mg by mouth daily.) 30 tablet 3  . potassium chloride SA (KLOR-CON) 20 MEQ tablet Take 2 tablets (40 mEq total) by mouth in the morning. & 1 tablet (20 meq total) by mouth in the evening (Patient taking differently: Take 40 mEq by mouth in the morning. Take additional 20 mg if needed if take an extra Torsemide) 270 tablet 3  . torsemide (DEMADEX) 20 MG tablet Take 3 tablets (60 mg total) by mouth every morning. (Patient taking differently: Take 40 mg by mouth every morning. Additional 20 mg if needed for Afib) 90 tablet 3  . XARELTO 20 MG TABS tablet TAKE 1 TABLET BY MOUTH EVERY DAY WITH DINNER (Patient taking differently: Take 20 mg by mouth daily with supper.) 30 tablet 11   No current facility-administered medications for this visit.   Allergies:  Levaquin [levofloxacin] and Sulfa antibiotics   ROS: No dizziness or syncope.  Physical Exam: VS:  BP 124/62   Pulse 75   Ht 5\' 1"  (1.549 m)   Wt 163 lb (73.9 kg)   SpO2 93%   BMI 30.80 kg/m , BMI Body mass index is 30.8 kg/m.  Wt Readings from Last 3 Encounters:  06/13/20 163 lb (73.9 kg)  05/23/20 164 lb 14.5 oz (74.8 kg)  05/21/20 165 lb (74.8 kg)    General: Patient appears  comfortable at rest. HEENT: Conjunctiva and lids normal, wearing a mask. Neck: Supple, no elevated JVP or carotid bruits, no thyromegaly. Lungs: Clear to auscultation, nonlabored breathing at rest. Cardiac: Regular rate and rhythm, no S3 or significant systolic murmur, no pericardial rub. Extremities: No pitting edema.  ECG:  An ECG dated 05/23/2020 was personally reviewed today and demonstrated:  Atrial fibrillation.  Recent Labwork: 06/23/2019: ALT 22; AST 29; B Natriuretic Peptide 337.0; Magnesium 2.3 05/21/2020: BUN 20; Creatinine, Ser 1.09; Hemoglobin 11.3; Platelets 291; Potassium 3.9; Sodium 134; TSH 6.239   Other Studies Reviewed Today:  Echocardiogram 12/01/2019: 1. Left ventricular ejection fraction, by estimation, is 55 to 60%. The  left ventricle has normal function. The left ventricle has no regional  wall motion abnormalities. Left ventricular diastolic parameters  are  indeterminate.  2. Right ventricular systolic function is normal. The right ventricular  size is mildly enlarged.  3. Left atrial size was moderately dilated.  4. Right atrial size was moderately dilated.  5. The mitral valve is normal in structure. Mild mitral valve  regurgitation. No evidence of mitral stenosis.  6. Tricuspid valve regurgitation is moderate.  7. The aortic valve is tricuspid. Aortic valve regurgitation is not  visualized. No aortic stenosis is present.  8. Moderate pulmonary HTN, PASP is 51 mmHg.  9. The inferior vena cava is normal in size with greater than 50%  respiratory variability, suggesting right atrial pressure of 3 mmHg.   Assessment and Plan:  1.  Persistent atrial fibrillation with CHA2DS2-VASc score of 6.  She is status post successful cardioversion in March, remains in sinus rhythm today.  At this point not entirely clear that she has had much symptomatic improvement but will continue with strategy of rhythm control at the present time.  Continue amiodarone,  Cardizem CD, Toprol-XL, and Xarelto at current doses.  2.  Acquired thrombophilia.  CHA2DS2-VASc score is 6 and she is on Xarelto for stroke prophylaxis.  No reported spontaneous bleeding problems.  3.  Chronic diastolic heart failure and lymphedema.  Weight is stable.  She continues on Demadex with potassium supplement.  Medication Adjustments/Labs and Tests Ordered: Current medicines are reviewed at length with the patient today.  Concerns regarding medicines are outlined above.   Tests Ordered: Orders Placed This Encounter  Procedures  . EKG 12-Lead    Medication Changes: No orders of the defined types were placed in this encounter.   Disposition:  Follow up 4 months in the [place] office.  Signed, Satira Sark, MD, [facility] 06/13/2020 3:36 PM    [address] [address], [place], [place] [address] Phone: [phone]; Fax: [phone]

## 2020-06-13 NOTE — Patient Instructions (Addendum)
Medication Instructions:    Your physician recommends that you continue on your current medications as directed. Please refer to the Current Medication list given to you today.  Labwork:  none  Testing/Procedures:  none  Follow-Up:  Your physician recommends that you schedule a follow-up appointment in: 4 months at the [place] office.  Any Other Special Instructions Will Be Listed Below (If Applicable).  If you need a refill on your cardiac medications before your next appointment, please call your pharmacy.

## 2020-08-11 IMAGING — CR DG CHEST 1V PORT
1 series · 1 of 1 positions shown · non-contrast
Comparison: 03/06/2015 chest radiograph.

CLINICAL DATA: Dyspnea

EXAM:
PORTABLE CHEST 1 VIEW

[portable]
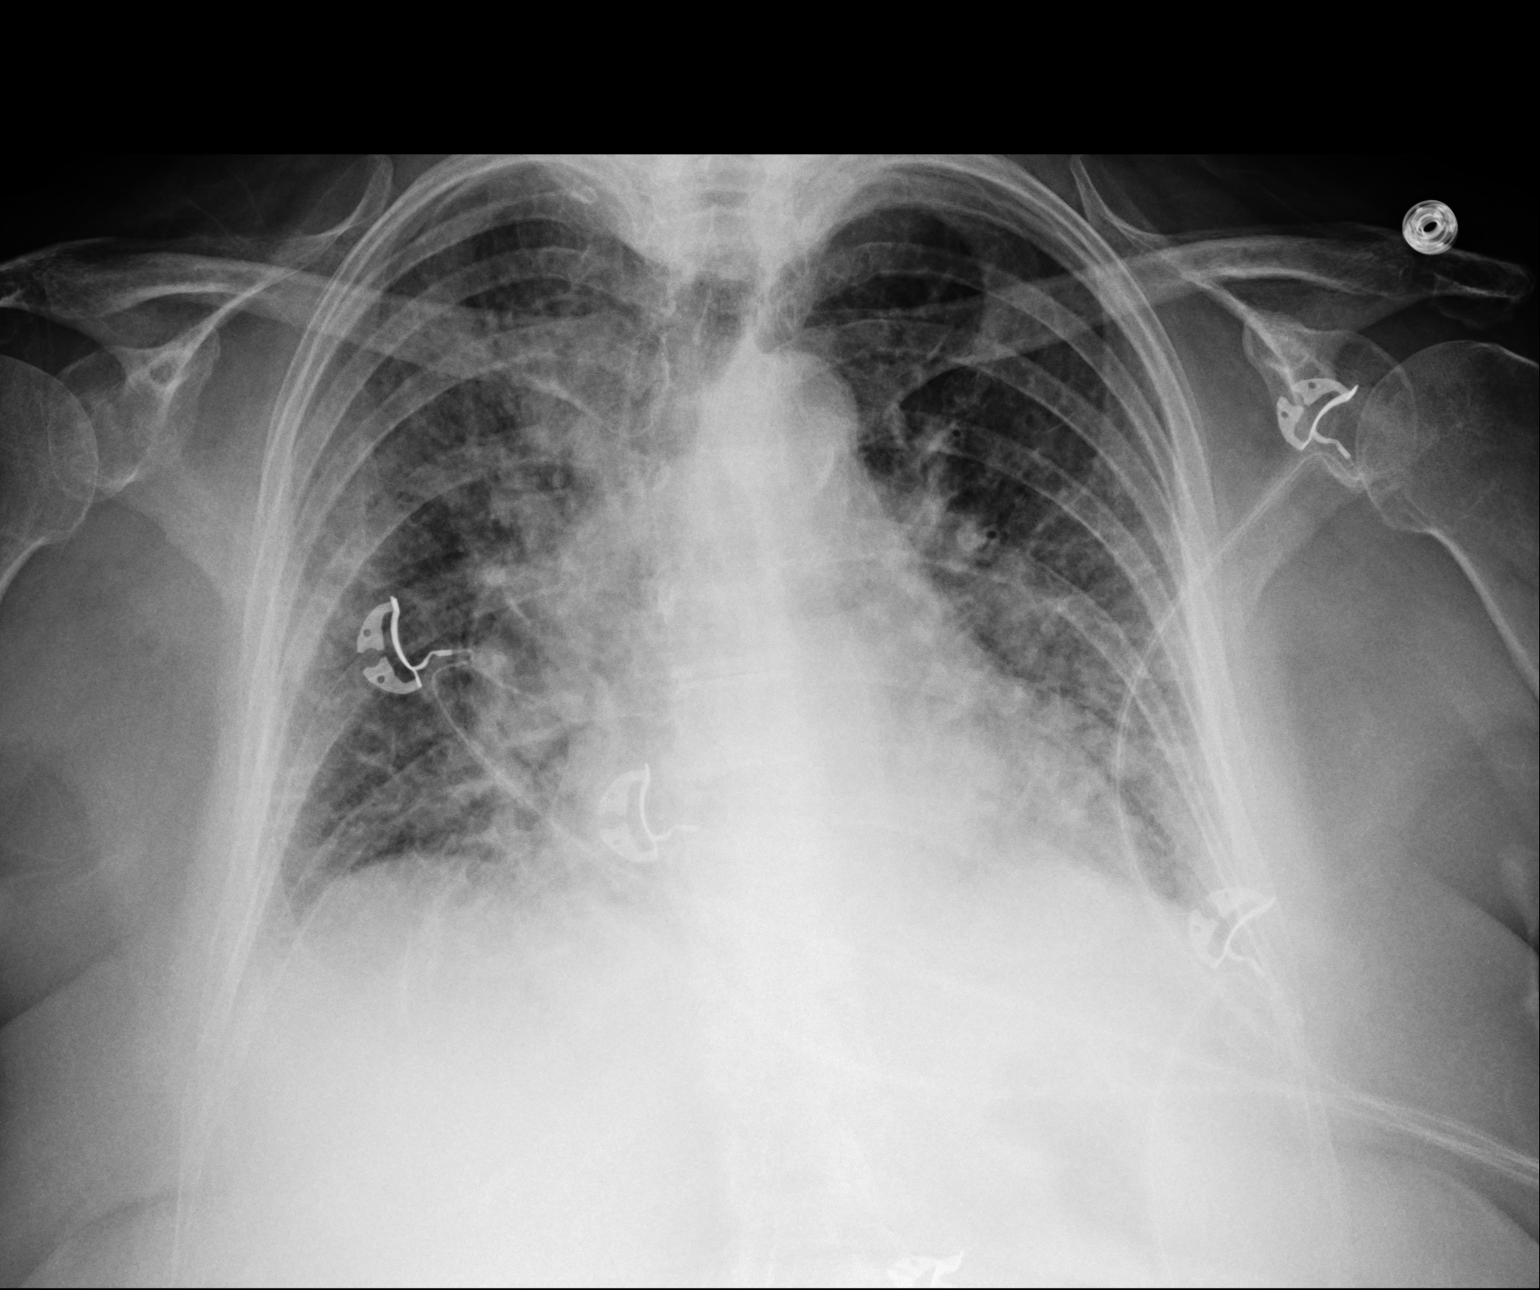

[1 of 1 positions shown; findings below may reference images not displayed]

FINDINGS: Stable cardiomediastinal silhouette with mild cardiomegaly. No
pneumothorax. Possible trace bilateral pleural effusions. Mild to
moderate pulmonary edema.
IMPRESSION: 1. Mild-to-moderate congestive heart failure.
2. Possible trace bilateral pleural effusions.

## 2020-08-12 ENCOUNTER — Telehealth: Payer: Self-pay | Admitting: Cardiology

## 2020-08-12 DIAGNOSIS — Z79899 Other long term (current) drug therapy: Secondary | ICD-10-CM

## 2020-08-12 DIAGNOSIS — I4819 Other persistent atrial fibrillation: Secondary | ICD-10-CM

## 2020-08-12 NOTE — Telephone Encounter (Signed)
I would have her come in for an ECG to clarify rhythm, we can determine if any further cardiac monitoring is necessary based on that.  Would also review medications at that time.  She needs follow-up CBC, BMET, and magnesium levels.

## 2020-08-12 NOTE — Telephone Encounter (Signed)
Pulse is dropping down to the 40's and sometimes the high 30's - it's lasting a few hours like this. Sometimes it will go back up and then come back down. This has been going on for 1 month and it's becoming more frequent.   She has not been taking her potassium.   Please call [phone]

## 2020-08-12 NOTE — Telephone Encounter (Signed)
Reports she noticed her HR being in the 40's and sometimes hight 30's. Reports checking HR with pulse oximeter and BP monitor. Today HR 43 at 11:00 am. While on phone, HR 44. Reports fatigue & SOB. Denies chest pain. Reports having dizziness all the time that is unchanged. Reports stopping potassium 1 year ago d/t not being able to swallow them. Reports stopping magnesium 3-4 years ago. Takes torsemide 40 mg daily and sometimes takes and extra tablet as needed for swelling. Medications reviewed.

## 2020-08-12 NOTE — Telephone Encounter (Signed)
Patient informed and verbalized understanding of plan. Lab orders faxed to [facility].  Offered appt for 08/13/20 but unable to come due conflict with husband's caregiver.

## 2020-08-14 ENCOUNTER — Ambulatory Visit: Payer: Medicare HMO

## 2020-09-04 ENCOUNTER — Telehealth: Payer: Self-pay

## 2020-09-04 MED ORDER — AMIODARONE HCL 200 MG PO TABS: 200.0000 mg | ORAL_TABLET | Freq: Every day | ORAL | 3 refills | Status: DC

## 2020-09-04 NOTE — Telephone Encounter (Signed)
Medication refill request for Amiodarone 200 mg tablets approved and sent to [facility].

## 2020-09-05 ENCOUNTER — Other Ambulatory Visit: Payer: Self-pay

## 2020-09-05 ENCOUNTER — Ambulatory Visit (INDEPENDENT_AMBULATORY_CARE_PROVIDER_SITE_OTHER): Payer: Medicare HMO

## 2020-09-05 VITALS — BP 132/58 | HR 53

## 2020-09-05 DIAGNOSIS — R001 Bradycardia, unspecified: Secondary | ICD-10-CM | POA: Diagnosis not present

## 2020-09-05 MED ORDER — DILTIAZEM HCL ER COATED BEADS 180 MG PO CP24: 180.0000 mg | ORAL_CAPSULE | Freq: Every day | ORAL | 3 refills | Status: DC

## 2020-09-05 NOTE — Progress Notes (Signed)
Pt being seen today for EKG per Dr. Domenic Polite.

## 2020-09-05 NOTE — Progress Notes (Signed)
EKG reviewed by Dr.McDowell in office today. Medication doses confirmed. Cardizem 300 mg qd will be decreased to 180 mg daily.E-scribed to pharmacy.

## 2020-09-05 NOTE — Patient Instructions (Signed)
After review of EKG by Dr.McDowell, diltiazem is decreased from 300 mg daily to 180 mg daily. E-scribed rx to pharmacy.

## 2020-09-05 NOTE — Addendum Note (Signed)
Addended by: Barbarann Ehlers A on: 09/05/2020 04:06 PM   Modules accepted: Orders

## 2020-09-05 NOTE — Addendum Note (Signed)
Addended by: Levonne Hubert on: 09/05/2020 04:07 PM   Modules accepted: Orders

## 2020-09-06 ENCOUNTER — Other Ambulatory Visit (HOSPITAL_COMMUNITY)
Admission: RE | Admit: 2020-09-06 | Discharge: 2020-09-06 | Disposition: A | Discharge status: Home / Self Care | Payer: Medicare HMO | Source: Ambulatory Visit | Attending: Cardiology | Admitting: Cardiology

## 2020-09-06 DIAGNOSIS — I4819 Other persistent atrial fibrillation: Secondary | ICD-10-CM

## 2020-09-06 DIAGNOSIS — Z79899 Other long term (current) drug therapy: Secondary | ICD-10-CM | POA: Diagnosis present

## 2020-09-06 LAB — BASIC METABOLIC PANEL
Anion gap: 6 (ref 5–15)
BUN: 22 mg/dL (ref 8–23)
CO2: 28 mmol/L (ref 22–32)
Calcium: 9.9 mg/dL (ref 8.9–10.3)
Chloride: 102 mmol/L (ref 98–111)
Creatinine, Ser: 1.08 mg/dL — ABNORMAL HIGH (ref 0.44–1.00)
GFR, Estimated: 54 mL/min — ABNORMAL LOW (ref >60)
Glucose, Bld: 89 mg/dL (ref 70–99)
Potassium: 4.3 mmol/L (ref 3.5–5.1)
Sodium: 136 mmol/L (ref 135–145)

## 2020-09-06 LAB — CBC
HCT: 37.5 % (ref 36.0–46.0)
Hemoglobin: 12.1 g/dL (ref 12.0–15.0)
MCH: 32 pg (ref 26.0–34.0)
MCHC: 32.3 g/dL (ref 30.0–36.0)
MCV: 99.2 fL (ref 80.0–100.0)
Platelets: 246 10*3/uL (ref 150–400)
RBC: 3.78 MIL/uL — ABNORMAL LOW (ref 3.87–5.11)
RDW: 14.6 % (ref 11.5–15.5)
WBC: 5.2 10*3/uL (ref 4.0–10.5)
nRBC: 0 % (ref 0.0–0.2)

## 2020-09-06 LAB — MAGNESIUM: Magnesium: 2.2 mg/dL (ref 1.7–2.4)

## 2020-09-12 ENCOUNTER — Telehealth: Payer: Self-pay | Admitting: Physician Assistant

## 2020-09-12 NOTE — Telephone Encounter (Signed)
  Pt called because her HR went down again today.   Since her Cardizem was decreased last week, she has been doing ok until today.   This pm, she started getting lightheaded. Did not feel in danger of losing consciousness, but is a little weak.    She took her meds this am, Cardizem CD 180 mg, Toprol XL 50 mg and amio 200 mg.  She prefers to wait this out.  However, her HR has been dropping into the 30s.  I strongly recommended that she call 911 and come to the ER.  She lives alone, I requested that if she insists on staying home, she needs to get someone to stay with her.   She will think about it.  Rosaria Ferries, PA-C 09/12/2020 6:58 PM

## 2020-09-13 MED ORDER — METOPROLOL SUCCINATE ER 25 MG PO TB24: 25.0000 mg | ORAL_TABLET | Freq: Every day | ORAL | 3 refills | Status: DC

## 2020-09-13 NOTE — Addendum Note (Signed)
Addended by: Barbarann Ehlers A on: 09/13/2020 11:05 AM   Modules accepted: Orders

## 2020-09-13 NOTE — Telephone Encounter (Signed)
   Please call the patient and follow-up today on her symptoms. Given her continued intermittent bradycardia, would further reduce Toprol-XL to 25mg  daily and may need to discontinue over time pending HR response.   Signed, Erma Heritage, PA-C 09/13/2020, 7:40 AM Pager: [phone]

## 2020-09-13 NOTE — Telephone Encounter (Signed)
I spoke with patient and she agrees to reduce Toprol to 25 mg daily. I also gave her results of lab work done 09/06/20

## 2020-10-24 ENCOUNTER — Telehealth: Payer: Self-pay | Admitting: Cardiology

## 2020-10-24 DIAGNOSIS — I340 Nonrheumatic mitral (valve) insufficiency: Secondary | ICD-10-CM

## 2020-10-24 MED ORDER — POTASSIUM CHLORIDE CRYS ER 20 MEQ PO TBCR: 20.0000 meq | EXTENDED_RELEASE_TABLET | Freq: Every day | ORAL | 3 refills | Status: DC

## 2020-10-24 NOTE — Telephone Encounter (Signed)
*  STAT* If patient is at the pharmacy, call can be transferred to refill team.   1. Which medications need to be refilled? (please list name of each medication and dose if known) Potassium   2. Which pharmacy/location (including street and city if local pharmacy) is medication to be sent to? [facility] on [address]. In [place], [place]  3. Do they need a 30 day or 90 day supply? [place]

## 2020-10-24 NOTE — Telephone Encounter (Signed)
Diane Cohen confirmed she is not taking potassium. She is taking torsemide. I e-scribed Potassium 20 meq qd #90 to [facility] on [address]

## 2020-10-24 NOTE — Telephone Encounter (Signed)
Patient states she stopped potassium over a year ago because she could not swallow the pill. Now she requests to re-start. She was taking 40 meq in the am, 20 meq in the pm. Most recent K+( 4.3)  was on 09/06/20.   Do you concur re-start?

## 2020-10-29 ENCOUNTER — Ambulatory Visit: Payer: Medicare HMO | Admitting: Student

## 2020-10-30 ENCOUNTER — Encounter: Payer: Self-pay | Admitting: Emergency Medicine

## 2020-10-30 ENCOUNTER — Other Ambulatory Visit: Payer: Self-pay

## 2020-10-30 ENCOUNTER — Ambulatory Visit
Admission: EM | Admit: 2020-10-30 | Discharge: 2020-10-30 | Disposition: A | Discharge status: Home / Self Care | Payer: Medicare HMO | Attending: Physician Assistant | Admitting: Physician Assistant

## 2020-10-30 DIAGNOSIS — U071 COVID-19: Secondary | ICD-10-CM

## 2020-10-30 MED ORDER — MOLNUPIRAVIR EUA 200MG CAPSULE: 4.0000 | ORAL_CAPSULE | Freq: Two times a day (BID) | ORAL | 0 refills | Status: AC

## 2020-10-30 NOTE — ED Provider Notes (Signed)
[facility]    CSN: [id number] Arrival date & time: 10/30/20  1232      History   Chief Complaint Chief Complaint  Patient presents with   Covid Positive    HPI Diane Cohen is a 75 y.o. female.   Pt reports she tested positive for covid yesterday.  Pt request prescription for covid medication.  Pt complains of cough   The history is provided by the patient. No language interpreter was used.  Cough Cough characteristics:  Non-productive Sputum characteristics:  Nondescript Severity:  Moderate Onset quality:  Gradual Timing:  Constant Progression:  Worsening Chronicity:  New Smoker: no   Relieved by:  Nothing Worsened by:  Nothing Ineffective treatments:  None tried Associated symptoms: no fever    Past Medical History:  Diagnosis Date   Anxiety    Atrial fibrillation (HCC)    Atrial fibrillation ablation in 2019 - Dr. Rayann Heman   Chronic diastolic heart failure ([place])    Depression    Essential hypertension    History of TIA (transient ischemic attack)    Hyperlipidemia    Hypothyroidism    Pneumonia     Patient Active Problem List   Diagnosis Date Noted   Acute bilateral low back pain without sciatica    Atrial fibrillation with rapid ventricular response ([place]) 06/23/2019   History of colonic polyps 12/13/2018   Constipation 12/13/2018   Abdominal pain, epigastric 12/13/2018   History of hiatal hernia 05/12/2018   Encounter for screening colonoscopy 05/12/2018   Acute on chronic diastolic CHF (congestive heart failure) ([place]) 04/20/2018   Swelling of limb 11/09/2017   Chronic diastolic heart failure ([place]) 12/29/2013   Anemia 09/20/2013   Bilateral leg edema 08/29/2013   Essential hypertension, benign    Hypothyroidism    Persistent atrial fibrillation     Past Surgical History:  Procedure Laterality Date   ABDOMINAL HYSTERECTOMY     ABLATION OF DYSRHYTHMIC FOCUS  07/06/2017   ATRIAL FIBRILLATION ABLATION N/A 07/06/2017   Procedure:  ATRIAL FIBRILLATION ABLATION;  Surgeon: Thompson Grayer, MD;  Location: [place] [facility];  Service: Cardiovascular;  Laterality: N/A;   BREAST SURGERY     biopsy   CARDIOVERSION N/A 01/23/2016   Procedure: CARDIOVERSION;  Surgeon: Satira Sark, MD;  [facility];  Service: Cardiovascular;  Laterality: N/A;   CARDIOVERSION N/A 11/13/2016   Procedure: CARDIOVERSION;  Surgeon: Arnoldo Lenis, MD;  [facility];  Service: Endoscopy;  Laterality: N/A;   CARDIOVERSION N/A 04/20/2018   Procedure: CARDIOVERSION;  Surgeon: [name], MD;  Location: [facility];  Service: Cardiovascular;  Laterality: N/A;   CARDIOVERSION N/A 05/23/2020   Procedure: CARDIOVERSION;  Surgeon: Satira Sark, MD;  [facility];  Service: Cardiovascular;  Laterality: N/A;   CHOLECYSTECTOMY     COLONOSCOPY N/A 10/07/2018   Sigmoid and descending colon diverticulosis, three 6 to 15 mm polyps in rectum, ascending colon, and IC valve, one 6 mm polyp in cecum. (sessile serrated polyps and sessile serrated adenoma of ascending colon with focal high grade dysplasia.   coloscopy     ESOPHAGEAL DILATION  10/07/2018   Procedure: ESOPHAGEAL DILATION;  Surgeon: Daneil Dolin, MD;  [facility];  Service: Endoscopy;;   ESOPHAGOGASTRODUODENOSCOPY N/A 10/07/2018   normal esophagus s/p dilation   POLYPECTOMY  10/07/2018   Procedure: POLYPECTOMY;  Surgeon: Daneil Dolin, MD;  [facility];  Service: Endoscopy;;  Cecal polyps x 2 hot  snare Ascending polyp x 1 Hot snare Rectal polyp x 1   TEE WITHOUT CARDIOVERSION  07/06/2017   TEE WITHOUT CARDIOVERSION N/A 07/06/2017   Procedure: TRANSESOPHAGEAL ECHOCARDIOGRAM (TEE);  Surgeon: Josue Hector, MD;  Location: [facility]COPY;  Service: Cardiovascular;  Laterality: N/A;    OB History   No obstetric history on file.      Home Medications    Prior to Admission medications   Medication Sig Start Date End Date  Taking? Authorizing Provider  molnupiravir EUA 200 mg CAPS Take 4 capsules (800 mg total) by mouth 2 (two) times daily for 5 days. 10/30/20 11/04/20 Yes Fransico Meadow, PA-C  acetaminophen (TYLENOL) 650 MG CR tablet Take 1,300 mg by mouth every 8 (eight) hours as needed for pain. Arthritis Strength    [provider]  amiodarone (PACERONE) 200 MG tablet Take 1 tablet (200 mg total) by mouth daily. 09/04/20   Satira Sark, MD  diltiazem (CARDIZEM CD) 180 MG 24 hr capsule Take 1 capsule (180 mg total) by mouth daily. 09/05/20 12/04/20  Satira Sark, MD  diltiazem (CARDIZEM) 30 MG tablet Take 1 tablet (30 mg total) by mouth 4 (four) times daily as needed (for atrial fibrillation). 10/06/17   Allred, Jeneen Rinks, MD  DULoxetine (CYMBALTA) 60 MG capsule Take 1 capsule (60 mg total) by mouth daily. Patient taking differently: Take by mouth daily. 06/24/19   Barton Dubois, MD  levothyroxine (SYNTHROID) 125 MCG tablet Take 125 mcg by mouth daily before breakfast.  11/09/19   [provider]  lisinopril (ZESTRIL) 10 MG tablet Take 1 tablet (10 mg total) by mouth daily. 07/12/19   Strader, Fransisco Hertz, PA-C  magnesium oxide (MAG-OX) 400 MG tablet Take 1 tablet (400 mg total) by mouth daily. Patient not taking: Reported on 08/12/2020 06/24/19   Barton Dubois, MD  metoprolol succinate (TOPROL XL) 25 MG 24 hr tablet Take 1 tablet (25 mg total) by mouth daily. 09/13/20   Strader, Fransisco Hertz, PA-C  pantoprazole (PROTONIX) 40 MG tablet TAKE 1 TABLET BY MOUTH DAILY 30 MINUTES BEFORE BREAKFAST Patient taking differently: Take 40 mg by mouth daily. 02/27/20   Erenest Rasher, PA-C  potassium chloride SA (KLOR-CON) 20 MEQ tablet Take 1 tablet (20 mEq total) by mouth daily. 10/24/20   Satira Sark, MD  torsemide (DEMADEX) 20 MG tablet Take 3 tablets (60 mg total) by mouth every morning. Patient taking differently: Take 40 mg by mouth every morning. Additional 20 mg if needed for Afib 11/27/19   Satira Sark, MD  XARELTO 20 MG TABS tablet TAKE 1 TABLET BY MOUTH EVERY DAY WITH DINNER Patient taking differently: Take 20 mg by mouth daily with supper. 04/08/20   Satira Sark, MD    Family History Family History  Problem Relation Age of Onset   Hypertension Other    Hypertension Father    Hypertension Mother    Anemia Sister    Hypertension Sister    Colon cancer Neg Hx    Colon polyps Neg Hx     Social History Social History   Tobacco Use   Smoking status: Former    Packs/day: 0.50    Types: Cigarettes    Quit date: 01/22/1973    Years since quitting: 47.8   Smokeless tobacco: Never  Vaping Use   Vaping Use: Never used  Substance Use Topics   Alcohol use: Not Currently    Alcohol/week: 0.0 standard drinks    Comment: 3 times  a year: Christmas, Thanksgiving, and maybe just because    Drug use: No     Allergies   Levaquin [levofloxacin] and Sulfa antibiotics   Review of Systems Review of Systems  Constitutional:  Negative for fever.  Respiratory:  Positive for cough.   All other systems reviewed and are negative.   Physical Exam Triage Vital Signs ED Triage Vitals [10/30/20 1257]  Enc Vitals Group     BP (!) 147/76     Pulse Rate 88     Resp 18     Temp 99.9 F (37.7 C)     Temp Source Oral     SpO2 94 %     Weight      Height      Head Circumference      Peak Flow      Pain Score 6     Pain Loc      Pain Edu?      Excl. in [place]?    No data found.  Updated Vital Signs BP (!) 147/76 (BP Location: Right Arm)   Pulse 88   Temp 99.9 F (37.7 C) (Oral)   Resp 18   SpO2 94%   Visual Acuity Right Eye Distance:   Left Eye Distance:   Bilateral Distance:    Right Eye Near:   Left Eye Near:    Bilateral Near:     Physical Exam Vitals reviewed.  Constitutional:      Appearance: Normal appearance.  Cardiovascular:     Rate and Rhythm: Normal rate and regular rhythm.  Pulmonary:     Effort: Pulmonary effort is normal.     Breath  sounds: Normal breath sounds.  Abdominal:     General: Abdomen is flat.  Musculoskeletal:        General: Normal range of motion.  Skin:    General: Skin is warm.  Neurological:     General: No focal deficit present.     Mental Status: She is alert.  Psychiatric:        Mood and Affect: Mood normal.     UC Treatments / Results  Labs (all labs ordered are listed, but only abnormal results are displayed) Labs Reviewed - No data to display  EKG   Radiology No results found.  Procedures Procedures (including critical care time)  Medications Ordered in UC Medications - No data to display  Initial Impression / Assessment and Plan / UC Course  I have reviewed the triage vital signs and the nursing notes.  Pertinent labs & imaging results that were available during my care of the patient were reviewed by me and considered in my medical decision making (see chart for details).     MDM: Pt has multiple medical problems.  Pt given rx for molnupiravir.   Final Clinical Impressions(s) / UC Diagnoses   Final diagnoses:  None   Discharge Instructions   None    ED Prescriptions     Medication Sig Dispense Auth. Provider   molnupiravir EUA 200 mg CAPS Take 4 capsules (800 mg total) by mouth 2 (two) times daily for 5 days. 40 capsule Fransico Meadow, [place]      PDMP not reviewed this encounter.   Fransico Meadow, [place] 10/30/20 1325

## 2020-10-30 NOTE — ED Triage Notes (Addendum)
Nasal congestion, cough, headache that started yesterday.  Pos at home covid test today.  Would like anti virals. States sometimes she feels like she is going to pass out. Has spoke to her heart doctor about this problem.

## 2020-11-18 ENCOUNTER — Other Ambulatory Visit: Payer: Self-pay | Admitting: Student

## 2020-11-20 ENCOUNTER — Other Ambulatory Visit: Payer: Self-pay | Admitting: Cardiology

## 2020-12-04 ENCOUNTER — Ambulatory Visit: Payer: Medicare HMO | Admitting: Student

## 2020-12-04 ENCOUNTER — Encounter: Payer: Self-pay | Admitting: Student

## 2020-12-04 ENCOUNTER — Other Ambulatory Visit: Payer: Self-pay

## 2020-12-04 ENCOUNTER — Encounter: Payer: Self-pay | Admitting: *Deleted

## 2020-12-04 VITALS — BP 130/80 | HR 88 | Ht 61.0 in | Wt 162.4 lb

## 2020-12-04 DIAGNOSIS — R0600 Dyspnea, unspecified: Secondary | ICD-10-CM

## 2020-12-04 DIAGNOSIS — I480 Paroxysmal atrial fibrillation: Secondary | ICD-10-CM

## 2020-12-04 DIAGNOSIS — I5032 Chronic diastolic (congestive) heart failure: Secondary | ICD-10-CM

## 2020-12-04 DIAGNOSIS — R0609 Other forms of dyspnea: Secondary | ICD-10-CM

## 2020-12-04 DIAGNOSIS — I10 Essential (primary) hypertension: Secondary | ICD-10-CM

## 2020-12-04 DIAGNOSIS — R079 Chest pain, unspecified: Secondary | ICD-10-CM | POA: Diagnosis not present

## 2020-12-04 NOTE — Patient Instructions (Addendum)
Medication Instructions:   Take an extra Toprol XL 25 mg the morning of your CT Scan   *If you need a refill on your cardiac medications before your next appointment, please call your pharmacy*   Lab Work: Your physician recommends that you return for lab work in: BMET   If you have labs (blood work) drawn today and your tests are completely normal, you will receive your results only by: [employer] (if you have MyChart) OR A paper copy in the mail If you have any lab test that is abnormal or we need to change your treatment, we will call you to review the results.   Testing/Procedures: Cardiac CT    Follow-Up: At [facility], you and your health needs are our priority.  As part of our continuing mission to provide you with exceptional heart care, we have created designated Provider Care Teams.  These Care Teams include your primary Cardiologist (physician) and Advanced Practice Providers (APPs -  Physician Assistants and Nurse Practitioners) who all work together to provide you with the care you need, when you need it.  We recommend signing up for the patient portal called "MyChart".  Sign up information is provided on this After Visit Summary.  MyChart is used to connect with patients for Virtual Visits (Telemedicine).  Patients are able to view lab/test results, encounter notes, upcoming appointments, etc.  Non-urgent messages can be sent to your provider as well.   To learn more about what you can do with MyChart, go to [email or web address].    Your next appointment:   6 month(s)  The format for your next appointment:   In Person  Provider:   Rozann Lesches, MD   Other Instructions Thank you for choosing [facility]!    Your cardiac CT will be scheduled at one of the below locations:   [facility] [address] [place], [place] [address] [phone]  [facility] [address] [place], [place] [address] [phone]  If scheduled at [facility], please arrive at the [facility] main entrance (entrance A) of [facility] 30 minutes prior to test start time. Proceed to the [facility] Radiology Department (first floor) to check-in and test prep.  If scheduled at [facility], please arrive 15 mins early for check-in and test prep.  Please follow these instructions carefully (unless otherwise directed):  Take an extra Toprol XL 25 mg the morning of your CT Scan   On the Night Before the Test: Be sure to Drink plenty of water. Do not consume any caffeinated/decaffeinated beverages or chocolate 12 hours prior to your test. Do not take any antihistamines 12 hours prior to your test.  On the Day of the Test: Drink plenty of water until 1 hour prior to the test. Do not eat any food 4 hours prior to the test. You may take your regular medications prior to the test.  HOLD Furosemide/Hydrochlorothiazide morning of the test. FEMALES- please wear underwire-free bra if available, avoid dresses & tight clothing   *For Clinical Staff only. Please instruct patient the following:* Heart Rate Medication Recommendations for Cardiac CT  Resting HR < 50 bpm  No medication  Resting HR 50-60 bpm and BP >110/50 mmHG   Consider Metoprolol tartrate 25 mg PO 90-120 min prior to scan  Resting HR 60-65 bpm and BP >110/50 mmHG  Metoprolol tartrate 50 mg PO 90-120 minutes prior to scan  Resting HR > 65 bpm and BP >110/50 mmHG  Metoprolol tartrate 100 mg PO 90-120 minutes prior to scan  Consider Ivabradine 10-15 mg PO or a calcium channel blocker for resting HR >60 bpm and contraindication to metoprolol tartrate  Consider Ivabradine 10-15 mg PO in combination with metoprolol tartrate for HR >80 bpm         After the Test: Drink plenty of water. After receiving IV contrast, you may experience a mild flushed feeling. This is  normal. On occasion, you may experience a mild rash up to 24 hours after the test. This is not dangerous. If this occurs, you can take Benadryl 25 mg and increase your fluid intake. If you experience trouble breathing, this can be serious. If it is severe call 911 IMMEDIATELY. If it is mild, please call our office. If you take any of these medications: Glipizide/Metformin, Avandament, Glucavance, please do not take 48 hours after completing test unless otherwise instructed.  Please allow 2-4 weeks for scheduling of routine cardiac CTs. Some insurance companies require a pre-authorization which may delay scheduling of this test.   For non-scheduling related questions, please contact the cardiac imaging nurse navigator should you have any questions/concerns: Marchia Bond, Cardiac Imaging Nurse Navigator Gordy Clement, Cardiac Imaging Nurse Navigator [facility] Direct Office Dial: [phone]   For scheduling needs, including cancellations and rescheduling, please call [place], [phone].

## 2020-12-04 NOTE — Progress Notes (Signed)
Cardiology Office Note    Date:  12/04/2020   ID:  Simya, Tercero [birth year], MRN [id number]  PCP:  Celene Squibb, MD  Cardiologist: Rozann Lesches, MD    Chief Complaint  Patient presents with   Follow-up    4 month visit    History of Present Illness:    Diane Cohen is a 75 y.o. female with past medical history of paroxysmal atrial fibrillation (s/p ablation in 2019, DCCV in 04/2018, repeat DCCV in 05/2020 after being started on Amiodarone), HFpEF, HTN, HLD, Hypothyroidism and history of TIA who presents to the office today for 39-month follow-up.   She was last examined by Dr. Domenic Polite in 06/2020 and reported still having fatigue but denied any associated chest pain or palpitations. She remained in normal sinus rhythm at that time following recent DCCV and was continued on Amiodarone, Cardizem CD, Toprol-XL and Xarelto.  She did call the office in 08/2020 reporting episodes of bradycardia and EKG confirmed sinus bradycardia, therefore Cardizem CD was reduced from 300 mg daily to 180 mg daily. She continued to have bradycardia in 09/2020 and Toprol-XL was reduced to 25 mg daily.  In talking with the patient today, she reports her heart rate has overall been well controlled when checked at home and is typically in the 70's to 80's. Reports her fatigue has overall improved but she has started to have some intermittent episodes of chest discomfort which can occur when she is emotional or can occur with activity. She does report her husband passed away this summer. She develops episodes of chest pressure or dyspnea sporadically but has noticed this when walking to the mailbox or to the shed. Symptoms typically resolve within a few minutes. No associated orthopnea, PND or pitting edema.   Past Medical History:  Diagnosis Date   Anxiety    Atrial fibrillation [facility])    Atrial fibrillation ablation in 2019 - Dr. Rayann Heman   Chronic diastolic heart failure ([place])    Depression     Essential hypertension    History of TIA (transient ischemic attack)    Hyperlipidemia    Hypothyroidism    Pneumonia     Past Surgical History:  Procedure Laterality Date   ABDOMINAL HYSTERECTOMY     ABLATION OF DYSRHYTHMIC FOCUS  07/06/2017   ATRIAL FIBRILLATION ABLATION N/A 07/06/2017   Procedure: ATRIAL FIBRILLATION ABLATION;  Surgeon: Thompson Grayer, MD;  Location: [place] [facility] CV LAB;  Service: Cardiovascular;  Laterality: N/A;   BREAST SURGERY     biopsy   CARDIOVERSION N/A 01/23/2016   Procedure: CARDIOVERSION;  Surgeon: Satira Sark, MD;  [facility];  Service: Cardiovascular;  Laterality: N/A;   CARDIOVERSION N/A 11/13/2016   Procedure: CARDIOVERSION;  Surgeon: Arnoldo Lenis, MD;  [facility];  Service: Endoscopy;  Laterality: N/A;   CARDIOVERSION N/A 04/20/2018   Procedure: CARDIOVERSION;  Surgeon: [name], MD;  Location: [facility] ENDOSCOPY;  Service: Cardiovascular;  Laterality: N/A;   CARDIOVERSION N/A 05/23/2020   Procedure: CARDIOVERSION;  Surgeon: Satira Sark, MD;  [facility];  Service: Cardiovascular;  Laterality: N/A;   CHOLECYSTECTOMY     COLONOSCOPY N/A 10/07/2018   Sigmoid and descending colon diverticulosis, three 6 to 15 mm polyps in rectum, ascending colon, and IC valve, one 6 mm polyp in cecum. (sessile serrated polyps and sessile serrated adenoma of ascending colon with focal high grade dysplasia.   coloscopy     ESOPHAGEAL DILATION  10/07/2018  Procedure: ESOPHAGEAL DILATION;  Surgeon: Daneil Dolin, MD;  [facility];  Service: Endoscopy;;   ESOPHAGOGASTRODUODENOSCOPY N/A 10/07/2018   normal esophagus s/p dilation   POLYPECTOMY  10/07/2018   Procedure: POLYPECTOMY;  Surgeon: Daneil Dolin, MD;  [facility];  Service: Endoscopy;;  Cecal polyps x 2 hot snare Ascending polyp x 1 Hot snare Rectal polyp x 1   TEE WITHOUT CARDIOVERSION  07/06/2017   TEE WITHOUT CARDIOVERSION N/A  07/06/2017   Procedure: TRANSESOPHAGEAL ECHOCARDIOGRAM (TEE);  Surgeon: Josue Hector, MD;  Location: [facility] ENDOSCOPY;  Service: Cardiovascular;  Laterality: N/A;    Current Medications: Outpatient Medications Prior to Visit  Medication Sig Dispense Refill   acetaminophen (TYLENOL) 650 MG CR tablet Take 1,300 mg by mouth every 8 (eight) hours as needed for pain. Arthritis Strength     amiodarone (PACERONE) 200 MG tablet Take 1 tablet (200 mg total) by mouth daily. 90 tablet 3   diltiazem (CARDIZEM CD) 180 MG 24 hr capsule Take 1 capsule (180 mg total) by mouth daily. 90 capsule 3   diltiazem (CARDIZEM) 30 MG tablet Take 1 tablet (30 mg total) by mouth 4 (four) times daily as needed (for atrial fibrillation). 60 tablet 3   DULoxetine (CYMBALTA) 60 MG capsule Take 1 capsule (60 mg total) by mouth daily. (Patient taking differently: Take by mouth daily.) 30 capsule 1   levothyroxine (SYNTHROID) 125 MCG tablet Take 125 mcg by mouth daily before breakfast.      lisinopril (ZESTRIL) 10 MG tablet TAKE 1 TABLET(10 MG) BY MOUTH DAILY 90 tablet 3   magnesium oxide (MAG-OX) 400 MG tablet Take 1 tablet (400 mg total) by mouth daily. 20 tablet 0   metoprolol succinate (TOPROL XL) 25 MG 24 hr tablet Take 1 tablet (25 mg total) by mouth daily. 90 tablet 3   pantoprazole (PROTONIX) 40 MG tablet TAKE 1 TABLET BY MOUTH DAILY 30 MINUTES BEFORE BREAKFAST (Patient taking differently: Take 40 mg by mouth daily.) 30 tablet 3   potassium chloride SA (KLOR-CON) 20 MEQ tablet Take 1 tablet (20 mEq total) by mouth daily. 90 tablet 3   torsemide (DEMADEX) 20 MG tablet TAKE 2 TABLETS BY MOUTH EVERY MORNING 180 tablet 1   XARELTO 20 MG TABS tablet TAKE 1 TABLET BY MOUTH EVERY DAY WITH DINNER (Patient taking differently: Take 20 mg by mouth daily with supper.) 30 tablet 11   No facility-administered medications prior to visit.     Allergies:   Levaquin [levofloxacin] and Sulfa antibiotics   Social History   Socioeconomic  History   Marital status: Married    Spouse name: Not on file   Number of children: Not on file   Years of education: Not on file   Highest education level: Not on file  Occupational History   Not on file  Tobacco Use   Smoking status: Former    Packs/day: 0.50    Types: Cigarettes    Quit date: 01/22/1973    Years since quitting: 47.8   Smokeless tobacco: Never  Vaping Use   Vaping Use: Never used  Substance and Sexual Activity   Alcohol use: Not Currently    Alcohol/week: 0.0 standard drinks    Comment: 3 times a year: Christmas, Thanksgiving, and maybe just because    Drug use: No   Sexual activity: Yes    Birth control/protection: Surgical  Other Topics Concern   Not on file  Social History Narrative   Lives with spouse  in [place] has ALS and is dependant on the patient for transition from bed to chair.   Social Determinants of Health   Financial Resource Strain: Not on file  Food Insecurity: Not on file  Transportation Needs: Not on file  Physical Activity: Not on file  Stress: Not on file  Social Connections: Not on file     Family History:  The patient's family history includes Anemia in her sister; Hypertension in her father, mother, sister, and another family member.   Review of Systems:    Please see the history of present illness.     All other systems reviewed and are otherwise negative except as noted above.   Physical Exam:    VS:  BP 130/80   Pulse 88   Ht 5\' 1"  (1.549 m)   Wt 162 lb 6.4 oz (73.7 kg)   SpO2 95%   BMI 30.69 kg/m    General: Well developed, well nourished,female appearing in no acute distress. Head: Normocephalic, atraumatic. Neck: No carotid bruits. JVD not elevated.  Lungs: Respirations regular and unlabored, without wheezes or rales.  Heart: Regular rate and rhythm. No S3 or S4.  No murmur, no rubs, or gallops appreciated. Abdomen: Appears non-distended. No obvious abdominal masses. Msk:  Strength and tone  appear normal for age. No obvious joint deformities or effusions. Extremities: No clubbing or cyanosis. No pitting edema.  Distal pedal pulses are 2+ bilaterally. Neuro: Alert and oriented X 3. Moves all extremities spontaneously. No focal deficits noted. Psych:  Responds to questions appropriately with a normal affect. Skin: No rashes or lesions noted  Wt Readings from Last 3 Encounters:  12/04/20 162 lb 6.4 oz (73.7 kg)  06/13/20 163 lb (73.9 kg)  05/23/20 164 lb 14.5 oz (74.8 kg)     Studies/Labs Reviewed:   EKG:  EKG is not ordered today.   Recent Labs: 05/21/2020: TSH 6.239 09/06/2020: BUN 22; Creatinine, Ser 1.08; Hemoglobin 12.1; Magnesium 2.2; Platelets 246; Potassium 4.3; Sodium 136   Lipid Panel No results found for: CHOL, TRIG, HDL, CHOLHDL, VLDL, LDLCALC, LDLDIRECT  Additional studies/ records that were reviewed today include:   Echocardiogram: 11/2019 IMPRESSIONS     1. Left ventricular ejection fraction, by estimation, is 55 to 60%. The  left ventricle has normal function. The left ventricle has no regional  wall motion abnormalities. Left ventricular diastolic parameters are  indeterminate.   2. Right ventricular systolic function is normal. The right ventricular  size is mildly enlarged.   3. Left atrial size was moderately dilated.   4. Right atrial size was moderately dilated.   5. The mitral valve is normal in structure. Mild mitral valve  regurgitation. No evidence of mitral stenosis.   6. Tricuspid valve regurgitation is moderate.   7. The aortic valve is tricuspid. Aortic valve regurgitation is not  visualized. No aortic stenosis is present.   8. Moderate pulmonary HTN, PASP is 51 mmHg.   9. The inferior vena cava is normal in size with greater than 50%  respiratory variability, suggesting right atrial pressure of 3 mmHg.   Assessment:    1. Chest pain, unspecified type   2. Dyspnea on exertion   3. Paroxysmal atrial fibrillation (HCC)   4.  Essential hypertension   5. Chronic heart failure with preserved ejection fraction (HCC)      Plan:   In order of problems listed above:  1. Chest Pain/Dyspnea on Exertion - She initially described episodes of chest discomfort  which occur when she is stressed or anxious but upon further interview reported episodes of chest/shoulder tightness which comes on with activity along with dyspnea on exertion. - Reviewed options with the patient in regards to nuclear stress testing vs. Coronary CT and she is in agreement to proceed with a Coronary CT. She is already on AV nodal blocking agents with Cardizem CD 180 mg daily and Toprol-XL 25 mg daily. I have advised that she take an extra Toprol-XL 25 mg that morning. Would not adjust her typical baseline dose given prior bradycardia earlier this year  2. Paroxysmal Atrial Fibrillation - She denies any recent palpitations and is normal sinus rhythm on examination today.  She remains on Amiodarone 200 mg daily along with Cardizem CD 180 mg daily and Toprol-XL 25 mg daily. Given use of Amiodarone, will request most recent labs from her PCP to make sure her TSH and LFT's have been checked recently given the use of Amiodarone (TSH elevated to 6.239 in 05/2020).  - No reports of active bleeding. She remains on Xarelto for anticoagulation and hemoglobin was stable at 12.1 in 09/2020 with platelets at 246 K. Creatinine clearance was at 52 mL/min based off most recent labs so will continue to follow to ensure she remains on the appropriate dose of Xarelto.  3. HTN - Her blood pressure remains well controlled at 130/80 during today's visit. Continue current medication regimen with Cardizem CD, Lisinopril and Toprol-XL.  4. Diastolic Dysfunction - Echocardiogram last year showed a preserved EF of 55 to 60% with no regional motion abnormalities. She remains on Torsemide 40 mg daily along with K+ supplementation and appears euvolemic on examination  today.   Medication Adjustments/Labs and Tests Ordered: Current medicines are reviewed at length with the patient today.  Concerns regarding medicines are outlined above.  Medication changes, Labs and Tests ordered today are listed in the Patient Instructions below. Patient Instructions  Medication Instructions:   Take an extra Toprol XL 25 mg the morning of your CT Scan   *If you need a refill on your cardiac medications before your next appointment, please call your pharmacy*   Lab Work: Your physician recommends that you return for lab work in: BMET   If you have labs (blood work) drawn today and your tests are completely normal, you will receive your results only by: [employer] (if you have MyChart) OR A paper copy in the mail If you have any lab test that is abnormal or we need to change your treatment, we will call you to review the results.   Testing/Procedures: Cardiac CT    Follow-Up: At [facility], you and your health needs are our priority.  As part of our continuing mission to provide you with exceptional heart care, we have created designated Provider Care Teams.  These Care Teams include your primary Cardiologist (physician) and Advanced Practice Providers (APPs -  Physician Assistants and Nurse Practitioners) who all work together to provide you with the care you need, when you need it.  We recommend signing up for the patient portal called "MyChart".  Sign up information is provided on this After Visit Summary.  MyChart is used to connect with patients for Virtual Visits (Telemedicine).  Patients are able to view lab/test results, encounter notes, upcoming appointments, etc.  Non-urgent messages can be sent to your provider as well.   To learn more about what you can do with MyChart, go to [email or web address].    Your next appointment:   6  month(s)  The format for your next appointment:   In Person  Provider:   Rozann Lesches, MD   Other  Instructions Thank you for choosing [facility]!    Your cardiac CT will be scheduled at one of the below locations:   [facility] [address] [place], [place] [address] [phone]  [place] [address] [place], [place] [address] [phone]  If scheduled at [facility], please arrive at the [facility] main entrance (entrance A) of [facility] 30 minutes prior to test start time. Proceed to the [facility] Radiology Department (first floor) to check-in and test prep.  If scheduled at [facility], please arrive 15 mins early for check-in and test prep.  Please follow these instructions carefully (unless otherwise directed):  Take an extra Toprol XL 25 mg the morning of your CT Scan   On the Night Before the Test: Be sure to Drink plenty of water. Do not consume any caffeinated/decaffeinated beverages or chocolate 12 hours prior to your test. Do not take any antihistamines 12 hours prior to your test.  On the Day of the Test: Drink plenty of water until 1 hour prior to the test. Do not eat any food 4 hours prior to the test. You may take your regular medications prior to the test.  HOLD Furosemide/Hydrochlorothiazide morning of the test. FEMALES- please wear underwire-free bra if available, avoid dresses & tight clothing   *For Clinical Staff only. Please instruct patient the following:* Heart Rate Medication Recommendations for Cardiac CT  Resting HR < 50 bpm  No medication  Resting HR 50-60 bpm and BP >110/50 mmHG   Consider Metoprolol tartrate 25 mg PO 90-120 min prior to scan  Resting HR 60-65 bpm and BP >110/50 mmHG  Metoprolol tartrate 50 mg PO 90-120 minutes prior to scan   Resting HR > 65 bpm and BP >110/50 mmHG  Metoprolol tartrate 100 mg PO 90-120 minutes prior to scan  Consider Ivabradine 10-15 mg PO or a calcium channel blocker for  resting HR >60 bpm and contraindication to metoprolol tartrate  Consider Ivabradine 10-15 mg PO in combination with metoprolol tartrate for HR >80 bpm         After the Test: Drink plenty of water. After receiving IV contrast, you may experience a mild flushed feeling. This is normal. On occasion, you may experience a mild rash up to 24 hours after the test. This is not dangerous. If this occurs, you can take Benadryl 25 mg and increase your fluid intake. If you experience trouble breathing, this can be serious. If it is severe call 911 IMMEDIATELY. If it is mild, please call our office. If you take any of these medications: Glipizide/Metformin, Avandament, Glucavance, please do not take 48 hours after completing test unless otherwise instructed.  Please allow 2-4 weeks for scheduling of routine cardiac CTs. Some insurance companies require a pre-authorization which may delay scheduling of this test.   For non-scheduling related questions, please contact the cardiac imaging nurse navigator should you have any questions/concerns: Marchia Bond, Cardiac Imaging Nurse Navigator Gordy Clement, Cardiac Imaging Nurse Navigator [facility] Direct Office Dial: [phone]   For scheduling needs, including cancellations and rescheduling, please call [place], [phone].    Signed, Erma Heritage, PA-C  12/04/2020 4:58 PM    [place] [address] [place], [place] [address] Phone: [phone] Fax: [phone]

## 2020-12-12 ENCOUNTER — Telehealth (HOSPITAL_COMMUNITY): Payer: Self-pay | Admitting: Emergency Medicine

## 2020-12-12 NOTE — Telephone Encounter (Signed)
Attempted to call patient regarding upcoming cardiac CT appointment. Left message on voicemail with name and callback number Marchia Bond RN Navigator Cardiac Imaging [facility] [phone] Office [phone] Cell  Courtesy call to remind patient to get labs prior to CTA on Monday at [facility] main lab

## 2020-12-16 ENCOUNTER — Ambulatory Visit (HOSPITAL_COMMUNITY): Payer: Medicare HMO

## 2020-12-19 ENCOUNTER — Telehealth (HOSPITAL_COMMUNITY): Payer: Self-pay | Admitting: *Deleted

## 2020-12-19 NOTE — Telephone Encounter (Signed)
Attempted to call patient regarding upcoming cardiac CT appointment. °Left message on voicemail with name and callback number ° °Khamiyah Grefe RN Navigator Cardiac Imaging °[facility] °[phone] Office °[phone] Cell ° °

## 2020-12-20 ENCOUNTER — Telehealth: Payer: Self-pay | Admitting: *Deleted

## 2020-12-20 ENCOUNTER — Telehealth (HOSPITAL_COMMUNITY): Payer: Self-pay | Admitting: *Deleted

## 2020-12-20 ENCOUNTER — Other Ambulatory Visit (HOSPITAL_COMMUNITY)
Admission: RE | Admit: 2020-12-20 | Discharge: 2020-12-20 | Disposition: A | Discharge status: Home / Self Care | Payer: Medicare HMO | Source: Ambulatory Visit | Attending: Student | Admitting: Student

## 2020-12-20 ENCOUNTER — Other Ambulatory Visit (HOSPITAL_COMMUNITY): Payer: Self-pay | Admitting: *Deleted

## 2020-12-20 DIAGNOSIS — R079 Chest pain, unspecified: Secondary | ICD-10-CM

## 2020-12-20 DIAGNOSIS — Z79899 Other long term (current) drug therapy: Secondary | ICD-10-CM

## 2020-12-20 DIAGNOSIS — I4819 Other persistent atrial fibrillation: Secondary | ICD-10-CM | POA: Insufficient documentation

## 2020-12-20 DIAGNOSIS — R0609 Other forms of dyspnea: Secondary | ICD-10-CM

## 2020-12-20 LAB — BASIC METABOLIC PANEL
Anion gap: 7 (ref 5–15)
BUN: 27 mg/dL — ABNORMAL HIGH (ref 8–23)
CO2: 29 mmol/L (ref 22–32)
Calcium: 9.7 mg/dL (ref 8.9–10.3)
Chloride: 100 mmol/L (ref 98–111)
Creatinine, Ser: 1.42 mg/dL — ABNORMAL HIGH (ref 0.44–1.00)
GFR, Estimated: 39 mL/min — ABNORMAL LOW (ref >60)
Glucose, Bld: 83 mg/dL (ref 70–99)
Potassium: 3.6 mmol/L (ref 3.5–5.1)
Sodium: 136 mmol/L (ref 135–145)

## 2020-12-20 MED ORDER — TORSEMIDE 20 MG PO TABS: 20.0000 mg | ORAL_TABLET | Freq: Every day | ORAL | 3 refills | Status: DC

## 2020-12-20 NOTE — Telephone Encounter (Signed)
Reaching out to patient to offer assistance regarding upcoming cardiac imaging study; pt verbalizes understanding of appt date/time, parking situation and where to check in, pre-test NPO status and medications ordered, and verified current allergies; name and call back number provided for further questions should they arise  Neel Buffone RN Navigator Cardiac Imaging [facility] [phone] office [phone] cell  

## 2020-12-20 NOTE — Telephone Encounter (Signed)
Called to notify pt of lab results. No answer. Left msg to call back.

## 2020-12-20 NOTE — Telephone Encounter (Signed)
-----   Message from Erma Heritage, [place] sent at 12/20/2020  4:07 PM EDT ----- A basic metabolic panel was obtained prior to her Coronary CT scheduled for next week. Her electrolytes are within a normal range but her kidney function has worsened since 09/2020 with creatinine increasing to 1.42. I would recommend that she reduce her Torsemide from 40 mg daily to 20 mg daily and hold Torsemide the morning of her Coronary CT.  Would recommend a repeat BMET in 3-4 weeks for reassessment of her renal function.

## 2020-12-23 ENCOUNTER — Telehealth: Payer: Self-pay

## 2020-12-23 ENCOUNTER — Other Ambulatory Visit: Payer: Self-pay | Admitting: Cardiology

## 2020-12-23 ENCOUNTER — Other Ambulatory Visit: Payer: Self-pay

## 2020-12-23 ENCOUNTER — Ambulatory Visit (HOSPITAL_COMMUNITY)
Admission: RE | Admit: 2020-12-23 | Discharge: 2020-12-23 | Disposition: A | Discharge status: Home / Self Care | Payer: Medicare HMO | Source: Ambulatory Visit | Attending: Student | Admitting: Student

## 2020-12-23 ENCOUNTER — Ambulatory Visit (HOSPITAL_COMMUNITY)
Admission: RE | Admit: 2020-12-23 | Discharge: 2020-12-23 | Disposition: A | Discharge status: Home / Self Care | Payer: Medicare HMO | Source: Ambulatory Visit | Attending: Cardiology | Admitting: Cardiology

## 2020-12-23 DIAGNOSIS — R0609 Other forms of dyspnea: Secondary | ICD-10-CM | POA: Insufficient documentation

## 2020-12-23 DIAGNOSIS — R079 Chest pain, unspecified: Secondary | ICD-10-CM | POA: Insufficient documentation

## 2020-12-23 DIAGNOSIS — R931 Abnormal findings on diagnostic imaging of heart and coronary circulation: Secondary | ICD-10-CM

## 2020-12-23 DIAGNOSIS — I2510 Atherosclerotic heart disease of native coronary artery without angina pectoris: Secondary | ICD-10-CM | POA: Diagnosis not present

## 2020-12-23 MED ORDER — NITROGLYCERIN 0.4 MG SL SUBL
SUBLINGUAL_TABLET | SUBLINGUAL | Status: AC
  Filled 2020-12-23: qty 2

## 2020-12-23 MED ORDER — IOHEXOL 350 MG/ML SOLN
95.0000 mL | Freq: Once | INTRAVENOUS | Status: AC | PRN
  Administered 2020-12-23: 95 mL via INTRAVENOUS

## 2020-12-23 MED ORDER — NITROGLYCERIN 0.4 MG SL SUBL
0.8000 mg | SUBLINGUAL_TABLET | Freq: Once | SUBLINGUAL | Status: AC
  Administered 2020-12-23: 0.8 mg via SUBLINGUAL

## 2020-12-23 NOTE — Telephone Encounter (Signed)
   Thank you for the update. Will include recommendations for repeat CT in result note once Coronary CT fully resulted.   Signed, Erma Heritage, PA-C 12/23/2020, 4:21 PM

## 2020-12-23 NOTE — Telephone Encounter (Signed)
Diane from [facility] called and wanted to make B. Strader, PA-C aware of results from pt's CT completed today asap.   IMPRESSION: Pulmonary nodules measure up to 6 mm in the left lower lobe and appear new from 06/23/2019. Recommend follow-up CT chest without contrast in 3-6 months as malignancy cannot be excluded. These results will be called to the ordering clinician or representative by the Radiologist Assistant, and communication documented in the PACS or [employer].     Electronically Signed   By: Lorin Picket M.D.   On: 12/23/2020 11:42

## 2020-12-24 DIAGNOSIS — I2510 Atherosclerotic heart disease of native coronary artery without angina pectoris: Secondary | ICD-10-CM | POA: Diagnosis not present

## 2020-12-25 ENCOUNTER — Telehealth: Payer: Self-pay | Admitting: *Deleted

## 2020-12-25 DIAGNOSIS — R918 Other nonspecific abnormal finding of lung field: Secondary | ICD-10-CM

## 2020-12-25 DIAGNOSIS — Z13220 Encounter for screening for lipoid disorders: Secondary | ICD-10-CM

## 2020-12-25 NOTE — Telephone Encounter (Signed)
Pt notified and orders placed  

## 2020-12-25 NOTE — Telephone Encounter (Signed)
-----   Message from Erma Heritage, [place] sent at 12/24/2020  3:56 PM EDT ----- Please let the patient know that her Coronary CT showed only mild plaque with less than 25 to 49% stenosis along the left anterior descending artery and no significant plaque along her other coronary arteries. They are sending this for additional analysis (FFR) to make sure this is stable. We will sometimes initiate aspirin but she is already on Xarelto for anticoagulation so would not start ASA. Please obtain a copy of labs from her PCP as we need to make sure her LDL is less than 70 to help prevent additional plaque buildup.  If LDL is above 70, would need to consider initiation of statin therapy.  The Radiology over-read did show pulmonary nodules which were new from prior CT last year and the radiologist did recommend a repeat chest CT without contrast in 3 to 6 months to make sure these remain stable. Can enter orders for this and this test can actually be performed at [facility].

## 2020-12-26 ENCOUNTER — Ambulatory Visit: Payer: Medicare HMO | Admitting: Student

## 2021-01-03 ENCOUNTER — Other Ambulatory Visit: Payer: Self-pay | Admitting: *Deleted

## 2021-01-03 MED ORDER — TORSEMIDE 20 MG PO TABS: 20.0000 mg | ORAL_TABLET | Freq: Every day | ORAL | 3 refills | Status: DC

## 2021-02-04 ENCOUNTER — Ambulatory Visit: Payer: Medicare HMO | Admitting: Student

## 2021-02-04 ENCOUNTER — Ambulatory Visit (HOSPITAL_COMMUNITY): Payer: Medicare HMO

## 2021-02-05 ENCOUNTER — Other Ambulatory Visit (HOSPITAL_COMMUNITY): Payer: Self-pay | Admitting: Family Medicine

## 2021-02-05 DIAGNOSIS — M25512 Pain in left shoulder: Secondary | ICD-10-CM

## 2021-02-05 DIAGNOSIS — M25511 Pain in right shoulder: Secondary | ICD-10-CM

## 2021-02-26 ENCOUNTER — Telehealth: Payer: Self-pay | Admitting: Radiology

## 2021-02-26 NOTE — Telephone Encounter (Signed)
Patient called about [facility] imaging  I will call her She previously wanted to wait until Jan.

## 2021-02-26 NOTE — Telephone Encounter (Signed)
Arbie Cookey, I received message about patient Diane Cohen imaging, but I think she needs appointment with Dr Aline Brochure, looks like she wanted to wait until January to see him, but now she is wanting to schedule.

## 2021-03-12 ENCOUNTER — Ambulatory Visit (HOSPITAL_COMMUNITY)
Admission: RE | Admit: 2021-03-12 | Discharge: 2021-03-12 | Disposition: A | Discharge status: Home / Self Care | Payer: Medicare HMO | Source: Ambulatory Visit | Attending: Student | Admitting: Student

## 2021-03-12 ENCOUNTER — Other Ambulatory Visit: Payer: Self-pay

## 2021-03-12 DIAGNOSIS — R918 Other nonspecific abnormal finding of lung field: Secondary | ICD-10-CM

## 2021-03-14 ENCOUNTER — Telehealth: Payer: Self-pay

## 2021-03-14 ENCOUNTER — Other Ambulatory Visit: Payer: Self-pay

## 2021-03-14 NOTE — Telephone Encounter (Signed)
Patient notified and verbalized understanding. Pt would like [facility] to schedule Chest Ct. PCP copied

## 2021-03-14 NOTE — Telephone Encounter (Signed)
-----   Message from Erma Heritage, [place] sent at 03/13/2021 11:43 AM EST ----- Please let the patient know her repeat Chest CT shows that her prior nodules are either stable in size or have resolved which suggests a benign etiology. There were a few new ones that were not noted on prior CT imaging (not captured given a more limited scan since prior imaging was a Coronary CT) and another repeat Chest CT is recommended in 6 months to assess stability and this can be arranged by [place] or her PCP.

## 2021-03-17 ENCOUNTER — Other Ambulatory Visit: Payer: Self-pay

## 2021-03-17 ENCOUNTER — Ambulatory Visit: Payer: Medicare HMO

## 2021-03-17 ENCOUNTER — Encounter: Payer: Self-pay | Admitting: Orthopedic Surgery

## 2021-03-17 ENCOUNTER — Ambulatory Visit (INDEPENDENT_AMBULATORY_CARE_PROVIDER_SITE_OTHER): Payer: Medicare HMO | Admitting: Orthopedic Surgery

## 2021-03-17 VITALS — BP 165/82 | HR 73 | Ht 61.0 in | Wt 165.0 lb

## 2021-03-17 DIAGNOSIS — G8929 Other chronic pain: Secondary | ICD-10-CM

## 2021-03-17 DIAGNOSIS — M25511 Pain in right shoulder: Secondary | ICD-10-CM

## 2021-03-17 DIAGNOSIS — M25512 Pain in left shoulder: Secondary | ICD-10-CM

## 2021-03-17 NOTE — Progress Notes (Signed)
Chief Complaint  Patient presents with   Shoulder Pain    Right worse than left hard to raise right shoulder and it pops   Diane Cohen 76 years old she has hypertension hypothyroidism she is on Xarelto for atrial fibrillation history  She comes in with bilateral shoulder pain right worse than left with decreased range of motion right worse than left and popping in both shoulders no trauma   Past Medical History:  Diagnosis Date   Anxiety    Atrial fibrillation (HCC)    Atrial fibrillation ablation in 2019 - Dr. Rayann Heman   Chronic diastolic heart failure ([place])    Depression    Essential hypertension    History of TIA (transient ischemic attack)    Hyperlipidemia    Hypothyroidism    Pneumonia    Past Surgical History:  Procedure Laterality Date   ABDOMINAL HYSTERECTOMY     ABLATION OF DYSRHYTHMIC FOCUS  07/06/2017   ATRIAL FIBRILLATION ABLATION N/A 07/06/2017   Procedure: ATRIAL FIBRILLATION ABLATION;  Surgeon: Thompson Grayer, MD;  Location: [place] [facility];  Service: Cardiovascular;  Laterality: N/A;   BREAST SURGERY     biopsy   CARDIOVERSION N/A 01/23/2016   Procedure: CARDIOVERSION;  Surgeon: Satira Sark, MD;  [facility];  Service: Cardiovascular;  Laterality: N/A;   CARDIOVERSION N/A 11/13/2016   Procedure: CARDIOVERSION;  Surgeon: Arnoldo Lenis, MD;  [facility];  Service: Endoscopy;  Laterality: N/A;   CARDIOVERSION N/A 04/20/2018   Procedure: CARDIOVERSION;  Surgeon: [name], MD;  Location: [facility] ENDOSCOPY;  Service: Cardiovascular;  Laterality: N/A;   CARDIOVERSION N/A 05/23/2020   Procedure: CARDIOVERSION;  Surgeon: Satira Sark, MD;  [facility];  Service: Cardiovascular;  Laterality: N/A;   CHOLECYSTECTOMY     COLONOSCOPY N/A 10/07/2018   Sigmoid and descending colon diverticulosis, three 6 to 15 mm polyps in rectum, ascending colon, and IC valve, one 6 mm polyp in cecum. (sessile serrated polyps and sessile serrated  adenoma of ascending colon with focal high grade dysplasia.   coloscopy     ESOPHAGEAL DILATION  10/07/2018   Procedure: ESOPHAGEAL DILATION;  Surgeon: Daneil Dolin, MD;  [facility];  Service: Endoscopy;;   ESOPHAGOGASTRODUODENOSCOPY N/A 10/07/2018   normal esophagus s/p dilation   POLYPECTOMY  10/07/2018   Procedure: POLYPECTOMY;  Surgeon: Daneil Dolin, MD;  [facility];  Service: Endoscopy;;  Cecal polyps x 2 hot snare Ascending polyp x 1 Hot snare Rectal polyp x 1   TEE WITHOUT CARDIOVERSION  07/06/2017   TEE WITHOUT CARDIOVERSION N/A 07/06/2017   Procedure: TRANSESOPHAGEAL ECHOCARDIOGRAM (TEE);  Surgeon: Josue Hector, MD;  Location: [facility] ENDOSCOPY;  Service: Cardiovascular;  Laterality: N/A;   BP (!) 165/82    Pulse 73    Ht 5\' 1"  (1.549 m)    Wt 165 lb (74.8 kg)    BMI 31.18 kg/m   Right shoulder Nonspecific tenderness around the right shoulder Range of motion is limited to less than 90 degrees of flexion and 90 degrees of abduction centering around 60 degrees Instability none Motor exam normal as tested Skin normal  Left shoulder Active range of motion is about 100 degrees in abduction and flexion No tenderness No instability Normal strength in abduction internal/external rotation Skin normal    Imaging right shoulder there may be some mild arthritis evidenced by joint space narrowing at the inferior margin of the glenohumeral joint otherwise normal  Imaging left  shoulder normal shoulder   Assessment and plan  76 year old first presentation with bilateral shoulder pain with significant cardiac history and anticoagulation recommend nonoperative treatment  PT injection Tylenol   Return if no improvement after 2 months   Procedure injection right shoulder subacromial joint and left shoulder subacromial joint   procedure note the subacromial injection shoulder RIGHT  Verbal consent was obtained to inject the  RIGHT   Shoulder  Timeout  was completed to confirm the injection site is a subacromial space of the  RIGHT  shoulder   Medication used Depo-Medrol 40 mg and lidocaine 1% 3 cc  Anesthesia was provided by ethyl chloride  The injection was performed in the RIGHT  posterior subacromial space. After pinning the skin with alcohol and anesthetized the skin with ethyl chloride the subacromial space was injected using a 20-gauge needle. There were no complications  Sterile dressing was applied.    Procedure note the subacromial injection shoulder left   Verbal consent was obtained to inject the  Left   Shoulder  Timeout was completed to confirm the injection site is a subacromial space of the  left  shoulder  Medication used Depo-Medrol 40 mg and lidocaine 1% 3 cc  Anesthesia was provided by ethyl chloride  The injection was performed in the left  posterior subacromial space. After pinning the skin with alcohol and anesthetized the skin with ethyl chloride the subacromial space was injected using a 20-gauge needle. There were no complications  Sterile dressing was applied.

## 2021-03-17 NOTE — Patient Instructions (Addendum)
If still hurting after 8 weeks call to schedule a new appt   Physical therapy has been ordered for you at [facility]. They should call you to schedule, [phone] is the phone number to call, if you want to call to schedule.

## 2021-03-17 NOTE — Progress Notes (Signed)
error 

## 2021-03-20 ENCOUNTER — Other Ambulatory Visit: Payer: Self-pay | Admitting: Cardiology

## 2021-04-11 ENCOUNTER — Other Ambulatory Visit: Payer: Self-pay | Admitting: Pharmacist

## 2021-04-11 ENCOUNTER — Other Ambulatory Visit: Payer: Self-pay | Admitting: Cardiology

## 2021-04-11 DIAGNOSIS — Z79899 Other long term (current) drug therapy: Secondary | ICD-10-CM

## 2021-04-11 DIAGNOSIS — Z13220 Encounter for screening for lipoid disorders: Secondary | ICD-10-CM

## 2021-04-11 NOTE — Telephone Encounter (Signed)
Prescription refill request for Xarelto received.  Indication: afib  Last office visit: Strader, 12/04/2020 Weight: 74.8 kg  Age: 76 yo  Scr: 1.42, 12/20/2020 CrCl: 65ml/min   Per dosing criteria pt qualifies for a dose decrease of Xarelto. However, CrCl is based on a Scr that was elevated. Pt has repeat blood work ordered.    Called and spoke to pt who stated that she is completely out of Xarelto. Informed her that I would send in a refill of Xarelto for a 1 month supply. Xarelto is a medication that we do not want her to miss any doses of. However, in the mean time she will need to go to a lab to get updated blood work.   Spoke to [employer], okay to refill pt's Xarelto with a 1 month supply- Xarelto 20mg  daily. He also stated that we could use the labs ([facility]) placed on 12/20/2020 so pt could get labs at [facility].   Refill sent.

## 2021-04-16 ENCOUNTER — Encounter (HOSPITAL_COMMUNITY): Payer: Self-pay

## 2021-04-16 ENCOUNTER — Other Ambulatory Visit: Payer: Self-pay

## 2021-04-16 ENCOUNTER — Ambulatory Visit (HOSPITAL_COMMUNITY): Payer: Medicare HMO | Attending: Orthopedic Surgery

## 2021-04-16 DIAGNOSIS — M25511 Pain in right shoulder: Secondary | ICD-10-CM | POA: Insufficient documentation

## 2021-04-16 DIAGNOSIS — G8929 Other chronic pain: Secondary | ICD-10-CM | POA: Diagnosis present

## 2021-04-16 DIAGNOSIS — M25612 Stiffness of left shoulder, not elsewhere classified: Secondary | ICD-10-CM | POA: Insufficient documentation

## 2021-04-16 DIAGNOSIS — R29898 Other symptoms and signs involving the musculoskeletal system: Secondary | ICD-10-CM | POA: Insufficient documentation

## 2021-04-16 DIAGNOSIS — M25611 Stiffness of right shoulder, not elsewhere classified: Secondary | ICD-10-CM | POA: Insufficient documentation

## 2021-04-16 DIAGNOSIS — M25512 Pain in left shoulder: Secondary | ICD-10-CM | POA: Diagnosis present

## 2021-04-16 NOTE — Patient Instructions (Signed)
Complete the following exercises 2-3 times a day.  Doorway Stretch  Place each hand opposite each other on the doorway. (You can change where you feel the stretch by moving arms higher or lower.) Step through with one foot and bend front knee until a stretch is felt and hold. Step through with the opposite foot on the next rep. Hold for __20-30___ seconds. Repeat __2__times.     Scapular Retraction (Standing)   With arms at sides, pinch shoulder blades together. Repeat __10__ times per set. Do __1__ sets per session. Do __2__ sessions per day.  http://orth.exer.us/944   Copyright  VHI. All rights reserved.     Wall Flexion  Slide your arm up the wall or door frame until a stretch is felt in your shoulder . Hold for 20-30 seconds. Complete 2 times     Shoulder Abduction Stretch  Stand side ways by a wall with affected up on wall. Gently step in toward wall to feel stretch. Hold for 20-30 seconds. Complete 2 times.

## 2021-04-16 NOTE — Therapy (Signed)
[place], [place], [address] Phone: [phone]   Fax:  [phone]  Occupational Therapy Evaluation  Patient Details  Name: Diane Cohen MRN: [id number] Date of Birth: Mar 07, 1946 Referring Provider (OT): Arther Abbott, MD   Encounter Date: 04/16/2021   OT End of Session - 04/16/21 1750     Visit Number 1    Number of Visits 4    Date for OT Re-Evaluation 05/14/21    Authorization Type [employer]    Authorization Time Period $20 copay, no visit limit    Progress Note Due on Visit 10    OT Start Time 1305    OT Stop Time 1340    OT Time Calculation (min) 35 min    Activity Tolerance Patient tolerated treatment well    Behavior During Therapy [facility] for tasks assessed/performed             Past Medical History:  Diagnosis Date   Anxiety    Atrial fibrillation (HCC)    Atrial fibrillation ablation in 2019 - Dr. Rayann Heman   Chronic diastolic heart failure ([place])    Depression    Essential hypertension    History of TIA (transient ischemic attack)    Hyperlipidemia    Hypothyroidism    Pneumonia     Past Surgical History:  Procedure Laterality Date   ABDOMINAL HYSTERECTOMY     ABLATION OF DYSRHYTHMIC FOCUS  07/06/2017   ATRIAL FIBRILLATION ABLATION N/A 07/06/2017   Procedure: ATRIAL FIBRILLATION ABLATION;  Surgeon: Thompson Grayer, MD;  Location: [place] [facility];  Service: Cardiovascular;  Laterality: N/A;   BREAST SURGERY     biopsy   CARDIOVERSION N/A 01/23/2016   Procedure: CARDIOVERSION;  Surgeon: Satira Sark, MD;  [facility];  Service: Cardiovascular;  Laterality: N/A;   CARDIOVERSION N/A 11/13/2016   Procedure: CARDIOVERSION;  Surgeon: Arnoldo Lenis, MD;  [facility];  Service: Endoscopy;  Laterality: N/A;   CARDIOVERSION N/A 04/20/2018   Procedure: CARDIOVERSION;  Surgeon: [name], MD;  Location: [facility] ENDOSCOPY;  Service: Cardiovascular;   Laterality: N/A;   CARDIOVERSION N/A 05/23/2020   Procedure: CARDIOVERSION;  Surgeon: Satira Sark, MD;  [facility];  Service: Cardiovascular;  Laterality: N/A;   CHOLECYSTECTOMY     COLONOSCOPY N/A 10/07/2018   Sigmoid and descending colon diverticulosis, three 6 to 15 mm polyps in rectum, ascending colon, and IC valve, one 6 mm polyp in cecum. (sessile serrated polyps and sessile serrated adenoma of ascending colon with focal high grade dysplasia.   coloscopy     ESOPHAGEAL DILATION  10/07/2018   Procedure: ESOPHAGEAL DILATION;  Surgeon: Daneil Dolin, MD;  [facility];  Service: Endoscopy;;   ESOPHAGOGASTRODUODENOSCOPY N/A 10/07/2018   normal esophagus s/p dilation   POLYPECTOMY  10/07/2018   Procedure: POLYPECTOMY;  Surgeon: Daneil Dolin, MD;  [facility];  Service: Endoscopy;;  Cecal polyps x 2 hot snare Ascending polyp x 1 Hot snare Rectal polyp x 1   TEE WITHOUT CARDIOVERSION  07/06/2017   TEE WITHOUT CARDIOVERSION N/A 07/06/2017   Procedure: TRANSESOPHAGEAL ECHOCARDIOGRAM (TEE);  Surgeon: Josue Hector, MD;  Location: [facility] ENDOSCOPY;  Service: Cardiovascular;  Laterality: N/A;    There were no vitals filed for this visit.   Subjective Assessment - 04/16/21 1309     Subjective  S: The shots really helped. Before I got those, I couldn't move my arms very  well.    Pertinent History Patient is a 76 y/o female S/P bilateral chronic shoulder pain. Patient reports this has been ongoing for approximately 1 year which may have been triggered when she was caring for her husband. Dr. Aline Brochure provided her with injections to both shoulders when seen on 03/17/21. Pt reports they have helped a lot with her pain.    Patient Stated Goals To decrease pain and be able to do things with both arms easier.    Currently in Pain? Yes    Pain Score 5     Pain Location Shoulder    Pain Orientation Right    Pain Descriptors / Indicators Sharp;Sore    Pain Type  Chronic pain    Pain Onset More than a month ago    Pain Frequency Occasional    Aggravating Factors  reaching overhead, behind back, lifting extra weight    Pain Relieving Factors injection, OTC pain medication    Effect of Pain on Daily Activities moderate effect    Multiple Pain Sites Yes    Pain Score 2    Pain Location Shoulder    Pain Orientation Left    Pain Descriptors / Indicators Sore    Pain Type Chronic pain    Pain Onset More than a month ago    Pain Frequency Occasional    Aggravating Factors  reaching behind back, overhead    Pain Relieving Factors injection, OTC pain medication    Effect of Pain on Daily Activities min effect               [facility] OT Assessment - 04/16/21 1312       Assessment   Medical Diagnosis Bilateral chronic shoulder pain    Referring Provider (OT) Arther Abbott, MD    Onset Date/Surgical Date --   at least 1 year.   Hand Dominance Left    Next MD Visit --   TBD     Precautions   Precautions None      Restrictions   Weight Bearing Restrictions No      Balance Screen   Has the patient fallen in the past 6 months Yes    How many times? [place] expects to be discharged to: Private residence      Prior Function   Level of Independence Independent      Mobility   Mobility Status Independent      Written Expression   Dominant Hand Left      Vision - History   Baseline Vision Wears glasses all the time      Cognition   Overall Cognitive Status Within Functional Limits for tasks assessed      ROM / Strength   AROM / PROM / Strength AROM;Strength      Palpation   Palpation comment Moderate fascial restrictions noted in bilateral upper trapezius region.      AROM   Overall AROM Comments Assessed seated. IR/er adducted    AROM Assessment Site Shoulder    Right/Left Shoulder Right;Left    Right Shoulder Flexion 105 Degrees    Right Shoulder ABduction 110 Degrees    Right Shoulder  Internal Rotation 90 Degrees    Right Shoulder External Rotation 45 Degrees    Left Shoulder Flexion 116 Degrees    Left Shoulder ABduction 125 Degrees    Left Shoulder Internal Rotation 90 Degrees    Left Shoulder External Rotation 45 Degrees  Strength   Overall Strength Comments Assessed seated. IR/er adducted    Strength Assessment Site Shoulder    Right/Left Shoulder Right;Left    Right Shoulder Flexion 5/5    Right Shoulder ABduction 5/5    Right Shoulder Internal Rotation 5/5    Right Shoulder External Rotation 5/5    Left Shoulder Flexion 5/5    Left Shoulder ABduction 5/5    Left Shoulder Internal Rotation 5/5    Left Shoulder External Rotation 5/5                              OT Education - 04/16/21 1749     Education Details shoulder stretches    Person(s) Educated Patient    Methods Explanation;Demonstration;Verbal cues;Handout;Tactile cues    Comprehension Returned demonstration;Verbalized understanding              OT Short Term Goals - 04/16/21 1806       OT SHORT TERM GOAL #1   Title Pt will be educated and independent with HEP in order to increase functional use of her BUEs and allow her to complete all daily tasks with minimal difficulty.    Time 4    Period Weeks    Status New    Target Date 05/14/21      OT SHORT TERM GOAL #2   Title Patient will increase her BUE A/ROM ROM by 10 degrees or more where able in order to decrease the amount of difficulty when performing reaching tasks above shoulder level or behind her back.    Time 4    Period Weeks    Status New      OT SHORT TERM GOAL #3   Title Pt will decrease the fascial restrictions in both shoulders to minimal amount or less in order to increase the functional mobility needed to complete household related tasks.    Time 4    Period Weeks    Status New      OT SHORT TERM GOAL #4   Title Pt will report a decrease in pain in both shoulders of approximately 3/10 or  less when completing self care tasks such as bathing and dressing.    Time 4    Period Weeks    Status New                      Plan - 04/16/21 1758     Clinical Impression Statement A: Patient is a 76 y/o female S/P chronic bilateral shoulder pain causing increased fascial restrictions and decreased ROM resulting in extreme difficulty when any reaching task above chest level, dressing, bathing, and completing all required daily tasks.    OT Occupational Profile and History Problem Focused Assessment - Including review of records relating to presenting problem    Occupational performance deficits (Please refer to evaluation for details): ADL's;IADL's    Body Structure / Function / Physical Skills ADL;ROM;UE functional use;Pain;Fascial restriction    Rehab Potential Excellent    Clinical Decision Making Limited treatment options, no task modification necessary    Comorbidities Affecting Occupational Performance: Presence of comorbidities impacting occupational performance    Comorbidities impacting occupational performance description: thoracic kyphosis and lumbar lordosis limits the ROM of bilateral shoulder joint.    Modification or Assistance to Complete Evaluation  No modification of tasks or assist necessary to complete eval    OT Frequency 1x / week    OT Duration  4 weeks    OT Treatment/Interventions Self-care/ADL training;Electrical Stimulation;Therapeutic exercise;Moist Heat;Cryotherapy;Ultrasound;DME and/or AE instruction;Manual Therapy;Passive range of motion;Therapeutic activities;Neuromuscular education;Patient/family education    Plan P: Pt will benefit from skilled OT services to increase functional use of her BUEs while participating in needed ADL tasks. Treatment plan: myofascial release, manual stretching, AA/ROM, A/ROM, general scapular strengthening. Modalities PRN. NEXT SESSION: FOTO    OT Home Exercise Plan eval : Shoulder stretches    Consulted and Agree with  Plan of Care Patient             Patient will benefit from skilled therapeutic intervention in order to improve the following deficits and impairments:   Body Structure / Function / Physical Skills: ADL, ROM, UE functional use, Pain, Fascial restriction       Visit Diagnosis: Other symptoms and signs involving the musculoskeletal system - Plan: Ot plan of care cert/re-cert  Stiffness of left shoulder, not elsewhere classified - Plan: Ot plan of care cert/re-cert  Stiffness of right shoulder, not elsewhere classified - Plan: Ot plan of care cert/re-cert  Chronic left shoulder pain - Plan: Ot plan of care cert/re-cert  Chronic right shoulder pain - Plan: Ot plan of care cert/re-cert    Problem List Patient Active Problem List   Diagnosis Date Noted   Acute bilateral low back pain without sciatica    Atrial fibrillation with rapid ventricular response ([place]) 06/23/2019   History of colonic polyps 12/13/2018   Constipation 12/13/2018   Abdominal pain, epigastric 12/13/2018   History of hiatal hernia 05/12/2018   Encounter for screening colonoscopy 05/12/2018   Acute on chronic diastolic CHF (congestive heart failure) ([place]) 04/20/2018   Swelling of limb 11/09/2017   Chronic diastolic heart failure ([place]) 12/29/2013   Anemia 09/20/2013   Bilateral leg edema 08/29/2013   Essential hypertension, benign    Hypothyroidism    Persistent atrial fibrillation    Ailene Ravel, OTR/L,CBIS  [phone]  04/16/2021, 6:14 PM  [place], [place], [address] Phone: [phone]   Fax:  [phone]  Name: Diane Cohen MRN: [id number] Date of Birth: Nov 01, 1945

## 2021-04-18 ENCOUNTER — Other Ambulatory Visit (HOSPITAL_COMMUNITY)
Admission: RE | Admit: 2021-04-18 | Discharge: 2021-04-18 | Disposition: A | Discharge status: Home / Self Care | Payer: Medicare HMO | Source: Ambulatory Visit | Attending: Student | Admitting: Student

## 2021-04-18 ENCOUNTER — Telehealth: Payer: Self-pay

## 2021-04-18 ENCOUNTER — Other Ambulatory Visit: Payer: Self-pay

## 2021-04-18 DIAGNOSIS — Z79899 Other long term (current) drug therapy: Secondary | ICD-10-CM | POA: Diagnosis present

## 2021-04-18 DIAGNOSIS — Z5181 Encounter for therapeutic drug level monitoring: Secondary | ICD-10-CM | POA: Insufficient documentation

## 2021-04-18 LAB — BASIC METABOLIC PANEL
Anion gap: 7 (ref 5–15)
BUN: 21 mg/dL (ref 8–23)
CO2: 28 mmol/L (ref 22–32)
Calcium: 9.9 mg/dL (ref 8.9–10.3)
Chloride: 104 mmol/L (ref 98–111)
Creatinine, Ser: 1.25 mg/dL — ABNORMAL HIGH (ref 0.44–1.00)
GFR, Estimated: 45 mL/min — ABNORMAL LOW (ref >60)
Glucose, Bld: 80 mg/dL (ref 70–99)
Potassium: 4.3 mmol/L (ref 3.5–5.1)
Sodium: 139 mmol/L (ref 135–145)

## 2021-04-18 MED ORDER — RIVAROXABAN 15 MG PO TABS: 15.0000 mg | ORAL_TABLET | Freq: Every day | ORAL | 6 refills | Status: DC

## 2021-04-18 NOTE — Telephone Encounter (Signed)
I spoke with patient and discussed lab results. She will finish the 20 mg Xarelto first as she just picked up rx a couple days ago. Ok'd per B.Strader, PA-C. Copied pcp

## 2021-04-18 NOTE — Telephone Encounter (Signed)
-----   Message from Erma Heritage, [place] sent at 04/18/2021  4:24 PM EST ----- Please let the patient know her electrolytes are within a normal range and her kidney function has improved compared to prior labs in 12/2020 as her creatinine was previously at 1.48 and is now down to 1.25.  In looking at her Xarelto dose, her calculated creatinine clearance is 46 mL/min and therefore her Xarelto dose should be reduced to 15 mg daily. Please provide a new Rx for this.

## 2021-04-21 ENCOUNTER — Encounter (HOSPITAL_COMMUNITY): Payer: Medicare HMO

## 2021-04-29 ENCOUNTER — Encounter (HOSPITAL_COMMUNITY): Payer: Self-pay | Admitting: Occupational Therapy

## 2021-04-29 ENCOUNTER — Ambulatory Visit (HOSPITAL_COMMUNITY): Payer: Medicare HMO | Admitting: Occupational Therapy

## 2021-04-29 ENCOUNTER — Other Ambulatory Visit: Payer: Self-pay

## 2021-04-29 DIAGNOSIS — R29898 Other symptoms and signs involving the musculoskeletal system: Secondary | ICD-10-CM | POA: Diagnosis not present

## 2021-04-29 DIAGNOSIS — M25511 Pain in right shoulder: Secondary | ICD-10-CM

## 2021-04-29 DIAGNOSIS — G8929 Other chronic pain: Secondary | ICD-10-CM

## 2021-04-29 DIAGNOSIS — M25612 Stiffness of left shoulder, not elsewhere classified: Secondary | ICD-10-CM

## 2021-04-29 DIAGNOSIS — M25611 Stiffness of right shoulder, not elsewhere classified: Secondary | ICD-10-CM

## 2021-04-29 DIAGNOSIS — M25512 Pain in left shoulder: Secondary | ICD-10-CM

## 2021-04-29 NOTE — Therapy (Signed)
[place], [place], [address] Phone: [phone]   Fax:  [phone]  Occupational Therapy Treatment  Patient Details  Name: Diane Cohen MRN: [id number] Date of Birth: 11-17-1945 Referring Provider (OT): Arther Abbott, MD   Encounter Date: 04/29/2021   OT End of Session - 04/29/21 1438     Visit Number 2    Number of Visits 4    Date for OT Re-Evaluation 05/14/21    Authorization Type [employer]    Authorization Time Period $20 copay, no visit limit    Progress Note Due on Visit 10    OT Start Time 1349    OT Stop Time 1430    OT Time Calculation (min) 41 min    Activity Tolerance Patient tolerated treatment well    Behavior During Therapy [facility] for tasks assessed/performed             Past Medical History:  Diagnosis Date   Anxiety    Atrial fibrillation (HCC)    Atrial fibrillation ablation in 2019 - Dr. Rayann Heman   Chronic diastolic heart failure ([place])    Depression    Essential hypertension    History of TIA (transient ischemic attack)    Hyperlipidemia    Hypothyroidism    Pneumonia     Past Surgical History:  Procedure Laterality Date   ABDOMINAL HYSTERECTOMY     ABLATION OF DYSRHYTHMIC FOCUS  07/06/2017   ATRIAL FIBRILLATION ABLATION N/A 07/06/2017   Procedure: ATRIAL FIBRILLATION ABLATION;  Surgeon: Thompson Grayer, MD;  Location: [facility];  Service: Cardiovascular;  Laterality: N/A;   BREAST SURGERY     biopsy   CARDIOVERSION N/A 01/23/2016   Procedure: CARDIOVERSION;  Surgeon: Satira Sark, MD;  [facility];  Service: Cardiovascular;  Laterality: N/A;   CARDIOVERSION N/A 11/13/2016   Procedure: CARDIOVERSION;  Surgeon: Arnoldo Lenis, MD;  [facility];  Service: Endoscopy;  Laterality: N/A;   CARDIOVERSION N/A 04/20/2018   Procedure: CARDIOVERSION;  Surgeon: [name], MD;  Location: [facility] ENDOSCOPY;  Service: Cardiovascular;   Laterality: N/A;   CARDIOVERSION N/A 05/23/2020   Procedure: CARDIOVERSION;  Surgeon: Satira Sark, MD;  [facility];  Service: Cardiovascular;  Laterality: N/A;   CHOLECYSTECTOMY     COLONOSCOPY N/A 10/07/2018   Sigmoid and descending colon diverticulosis, three 6 to 15 mm polyps in rectum, ascending colon, and IC valve, one 6 mm polyp in cecum. (sessile serrated polyps and sessile serrated adenoma of ascending colon with focal high grade dysplasia.   coloscopy     ESOPHAGEAL DILATION  10/07/2018   Procedure: ESOPHAGEAL DILATION;  Surgeon: Daneil Dolin, MD;  [facility];  Service: Endoscopy;;   ESOPHAGOGASTRODUODENOSCOPY N/A 10/07/2018   normal esophagus s/p dilation   POLYPECTOMY  10/07/2018   Procedure: POLYPECTOMY;  Surgeon: Daneil Dolin, MD;  [facility];  Service: Endoscopy;;  Cecal polyps x 2 hot snare Ascending polyp x 1 Hot snare Rectal polyp x 1   TEE WITHOUT CARDIOVERSION  07/06/2017   TEE WITHOUT CARDIOVERSION N/A 07/06/2017   Procedure: TRANSESOPHAGEAL ECHOCARDIOGRAM (TEE);  Surgeon: Josue Hector, MD;  Location: [facility] ENDOSCOPY;  Service: Cardiovascular;  Laterality: N/A;    There were no vitals filed for this visit.   Subjective Assessment - 04/29/21 1354     Subjective  S: They're doing better.    Currently in Pain? Yes    Pain  Score 4     Pain Location Shoulder    Pain Orientation Right    Pain Descriptors / Indicators Sore    Pain Type Chronic pain    Pain Radiating Towards N/A    Pain Onset More than a month ago    Pain Frequency Occasional    Aggravating Factors  reaching overhead, behind back, lifting extra weight    Pain Relieving Factors injection, OTC pain medication    Effect of Pain on Daily Activities mod effect                          OT Treatments/Exercises (OP) - 04/29/21 1355       Exercises   Exercises Shoulder      Shoulder Exercises: Supine   Protraction PROM;5 reps;AAROM;10 reps     Horizontal ABduction PROM;5 reps;AAROM;10 reps    External Rotation PROM;5 reps;AAROM;10 reps    Internal Rotation PROM;5 reps;AAROM;10 reps    Flexion PROM;5 reps;AAROM;10 reps    ABduction PROM;5 reps      Shoulder Exercises: Seated   Protraction AAROM;10 reps    Horizontal ABduction AAROM;10 reps    External Rotation AAROM;10 reps    Internal Rotation AAROM;10 reps    Flexion AAROM;10 reps    Abduction AROM;10 reps      Shoulder Exercises: Pulleys   Flexion 1 minute    ABduction 1 minute      Manual Therapy   Manual Therapy Myofascial release    Manual therapy comments completed separately from therapeutic exercises    Myofascial Release myofascial release and manual techniques to address fascial restrictions in right upper arm, anterior shoulder, and trapezius regions to decrease pain and fascial restrictions and increase joint ROM                      OT Short Term Goals - 04/29/21 1414       OT SHORT TERM GOAL #1   Title Pt will be educated and independent with HEP in order to increase functional use of her BUEs and allow her to complete all daily tasks with minimal difficulty.    Time 4    Period Weeks    Status On-going    Target Date 05/14/21      OT SHORT TERM GOAL #2   Title Patient will increase her BUE A/ROM ROM by 10 degrees or more where able in order to decrease the amount of difficulty when performing reaching tasks above shoulder level or behind her back.    Time 4    Period Weeks    Status On-going      OT SHORT TERM GOAL #3   Title Pt will decrease the fascial restrictions in both shoulders to minimal amount or less in order to increase the functional mobility needed to complete household related tasks.    Time 4    Period Weeks    Status On-going      OT SHORT TERM GOAL #4   Title Pt will report a decrease in pain in both shoulders of approximately 3/10 or less when completing self care tasks such as bathing and dressing.    Time 4     Period Weeks    Status On-going                      Plan - 04/29/21 1438     Clinical Impression Statement A: Initiated myofascial release  to RUE to address fascial restrictions. Completed passive stretching and AA/ROM for bilateral UEs. Also completing pulleys. Pt more limited in RUE compared to LUE during both passive and active exercises. Verbal and visual cuing for form and technique.    Body Structure / Function / Physical Skills ADL;ROM;UE functional use;Pain;Fascial restriction    Plan P: continue with AA/ROM and add to HEP, attempt scapular theraband    OT Home Exercise Plan eval : Shoulder stretches    Consulted and Agree with Plan of Care Patient             Patient will benefit from skilled therapeutic intervention in order to improve the following deficits and impairments:   Body Structure / Function / Physical Skills: ADL, ROM, UE functional use, Pain, Fascial restriction       Visit Diagnosis: Other symptoms and signs involving the musculoskeletal system  Stiffness of left shoulder, not elsewhere classified  Stiffness of right shoulder, not elsewhere classified  Chronic left shoulder pain  Chronic right shoulder pain    Problem List Patient Active Problem List   Diagnosis Date Noted   Acute bilateral low back pain without sciatica    Atrial fibrillation with rapid ventricular response ([place]) 06/23/2019   History of colonic polyps 12/13/2018   Constipation 12/13/2018   Abdominal pain, epigastric 12/13/2018   History of hiatal hernia 05/12/2018   Encounter for screening colonoscopy 05/12/2018   Acute on chronic diastolic CHF (congestive heart failure) ([place]) 04/20/2018   Swelling of limb 11/09/2017   Chronic diastolic heart failure (HCC) 12/29/2013   Anemia 09/20/2013   Bilateral leg edema 08/29/2013   Essential hypertension, benign    Hypothyroidism    Persistent atrial fibrillation     Guadelupe Sabin, OTR/L   [phone] 04/29/2021, 2:40 PM  [place], [place], [address] Phone: [phone]   Fax:  [phone]  Name: Diane Cohen MRN: [id number] Date of Birth: Feb 18, 1946

## 2021-05-06 ENCOUNTER — Other Ambulatory Visit: Payer: Self-pay

## 2021-05-06 ENCOUNTER — Encounter (HOSPITAL_COMMUNITY): Payer: Self-pay | Admitting: Occupational Therapy

## 2021-05-06 ENCOUNTER — Ambulatory Visit (HOSPITAL_COMMUNITY): Payer: Medicare HMO | Admitting: Occupational Therapy

## 2021-05-06 DIAGNOSIS — R29898 Other symptoms and signs involving the musculoskeletal system: Secondary | ICD-10-CM

## 2021-05-06 DIAGNOSIS — G8929 Other chronic pain: Secondary | ICD-10-CM

## 2021-05-06 DIAGNOSIS — M25612 Stiffness of left shoulder, not elsewhere classified: Secondary | ICD-10-CM

## 2021-05-06 DIAGNOSIS — M25611 Stiffness of right shoulder, not elsewhere classified: Secondary | ICD-10-CM

## 2021-05-06 DIAGNOSIS — M25511 Pain in right shoulder: Secondary | ICD-10-CM

## 2021-05-06 NOTE — Therapy (Signed)
[place], [place], [address] Phone: [phone]   Fax:  [phone]  Occupational Therapy Treatment  Patient Details  Name: Diane Cohen MRN: [id number] Date of Birth: [birth year] Referring Provider (OT): Arther Abbott, MD   Encounter Date: 05/06/2021   OT End of Session - 05/06/21 1742     Visit Number 3    Number of Visits 4    Date for OT Re-Evaluation 05/14/21    Authorization Type [employer]    Authorization Time Period $20 copay, no visit limit    Progress Note Due on Visit 10    OT Start Time 1531   pt arrived late   OT Stop Time 1559    OT Time Calculation (min) 28 min    Activity Tolerance Patient tolerated treatment well    Behavior During Therapy [facility] for tasks assessed/performed             Past Medical History:  Diagnosis Date   Anxiety    Atrial fibrillation ([place])    Atrial fibrillation ablation in 2019 - Dr. Rayann Heman   Chronic diastolic heart failure ([place])    Depression    Essential hypertension    History of TIA (transient ischemic attack)    Hyperlipidemia    Hypothyroidism    Pneumonia     Past Surgical History:  Procedure Laterality Date   ABDOMINAL HYSTERECTOMY     ABLATION OF DYSRHYTHMIC FOCUS  07/06/2017   ATRIAL FIBRILLATION ABLATION N/A 07/06/2017   Procedure: ATRIAL FIBRILLATION ABLATION;  Surgeon: Thompson Grayer, MD;  Location: [facility];  Service: Cardiovascular;  Laterality: N/A;   BREAST SURGERY     biopsy   CARDIOVERSION N/A 01/23/2016   Procedure: CARDIOVERSION;  Surgeon: Satira Sark, MD;  [facility];  Service: Cardiovascular;  Laterality: N/A;   CARDIOVERSION N/A 11/13/2016   Procedure: CARDIOVERSION;  Surgeon: Arnoldo Lenis, MD;  [facility];  Service: Endoscopy;  Laterality: N/A;   CARDIOVERSION N/A 04/20/2018   Procedure: CARDIOVERSION;  Surgeon: [name], MD;  Location: [facility] ENDOSCOPY;  Service:  Cardiovascular;  Laterality: N/A;   CARDIOVERSION N/A 05/23/2020   Procedure: CARDIOVERSION;  Surgeon: Satira Sark, MD;  [facility];  Service: Cardiovascular;  Laterality: N/A;   CHOLECYSTECTOMY     COLONOSCOPY N/A 10/07/2018   Sigmoid and descending colon diverticulosis, three 6 to 15 mm polyps in rectum, ascending colon, and IC valve, one 6 mm polyp in cecum. (sessile serrated polyps and sessile serrated adenoma of ascending colon with focal high grade dysplasia.   coloscopy     ESOPHAGEAL DILATION  10/07/2018   Procedure: ESOPHAGEAL DILATION;  Surgeon: Daneil Dolin, MD;  [facility];  Service: Endoscopy;;   ESOPHAGOGASTRODUODENOSCOPY N/A 10/07/2018   normal esophagus s/p dilation   POLYPECTOMY  10/07/2018   Procedure: POLYPECTOMY;  Surgeon: Daneil Dolin, MD;  [facility];  Service: Endoscopy;;  Cecal polyps x 2 hot snare Ascending polyp x 1 Hot snare Rectal polyp x 1   TEE WITHOUT CARDIOVERSION  07/06/2017   TEE WITHOUT CARDIOVERSION N/A 07/06/2017   Procedure: TRANSESOPHAGEAL ECHOCARDIOGRAM (TEE);  Surgeon: Josue Hector, MD;  Location: [facility] ENDOSCOPY;  Service: Cardiovascular;  Laterality: N/A;    There were no vitals filed for this visit.   Subjective Assessment - 05/06/21 1533     Subjective  S: They're feeling better.    Currently in Pain? No/denies  OT Treatments/Exercises (OP) - 05/06/21 1541       Exercises   Exercises Shoulder      Shoulder Exercises: Supine   Protraction PROM;5 reps    Horizontal ABduction PROM;5 reps    External Rotation PROM;5 reps    Internal Rotation PROM;5 reps    Flexion PROM;5 reps    ABduction PROM;5 reps      Shoulder Exercises: Standing   Protraction AROM;10 reps    Horizontal ABduction AROM;10 reps    External Rotation AAROM;10 reps    Internal Rotation AAROM;10 reps    Flexion AAROM;10 reps    ABduction AAROM;10 reps    Extension Theraband;10  reps    Theraband Level (Shoulder Extension) Level 2 (Red)    Row Theraband;10 reps    Theraband Level (Shoulder Row) Level 2 (Red)    Retraction Theraband;10 reps    Theraband Level (Shoulder Retraction) Level 2 (Red)      Shoulder Exercises: Pulleys   Flexion 1 minute    ABduction 1 minute      Functional Reaching Activities   Mid Level pt placing 5 cones onto middle shelf of overhead cabinet with left hand, removing with right hand.                    OT Education - 05/06/21 1548     Education Details shoulder A/ROM and AA/ROM-see handout    Person(s) Educated Patient    Methods Explanation;Demonstration;Verbal cues;Handout;Tactile cues    Comprehension Returned demonstration;Verbalized understanding              OT Short Term Goals - 04/29/21 1414       OT SHORT TERM GOAL #1   Title Pt will be educated and independent with HEP in order to increase functional use of her BUEs and allow her to complete all daily tasks with minimal difficulty.    Time 4    Period Weeks    Status On-going    Target Date 05/14/21      OT SHORT TERM GOAL #2   Title Patient will increase her BUE A/ROM ROM by 10 degrees or more where able in order to decrease the amount of difficulty when performing reaching tasks above shoulder level or behind her back.    Time 4    Period Weeks    Status On-going      OT SHORT TERM GOAL #3   Title Pt will decrease the fascial restrictions in both shoulders to minimal amount or less in order to increase the functional mobility needed to complete household related tasks.    Time 4    Period Weeks    Status On-going      OT SHORT TERM GOAL #4   Title Pt will report a decrease in pain in both shoulders of approximately 3/10 or less when completing self care tasks such as bathing and dressing.    Time 4    Period Weeks    Status On-going                      Plan - 05/06/21 1743     Clinical Impression Statement A: Pt arrived  late for session, passive stretching completed with pt achieving approximately 50% ROM with RUE and 75% ROM with LUE. Pt completing a combination of AA/ROM and A/ROM today, added scapular theraband and functional reaching task. Verbal cuing for form and technique.    Body Structure / Function / Physical Skills  ADL;ROM;UE functional use;Pain;Fascial restriction    Plan P: continue with functional reaching tasks, scapular theraband, attempt overhead lacing    OT Home Exercise Plan eval : Shoulder stretches    Consulted and Agree with Plan of Care Patient             Patient will benefit from skilled therapeutic intervention in order to improve the following deficits and impairments:   Body Structure / Function / Physical Skills: ADL, ROM, UE functional use, Pain, Fascial restriction       Visit Diagnosis: Other symptoms and signs involving the musculoskeletal system  Stiffness of left shoulder, not elsewhere classified  Stiffness of right shoulder, not elsewhere classified  Chronic left shoulder pain  Chronic right shoulder pain    Problem List Patient Active Problem List   Diagnosis Date Noted   Acute bilateral low back pain without sciatica    Atrial fibrillation with rapid ventricular response ([place]) 06/23/2019   History of colonic polyps 12/13/2018   Constipation 12/13/2018   Abdominal pain, epigastric 12/13/2018   History of hiatal hernia 05/12/2018   Encounter for screening colonoscopy 05/12/2018   Acute on chronic diastolic CHF (congestive heart failure) ([place]) 04/20/2018   Swelling of limb 11/09/2017   Chronic diastolic heart failure (HCC) 12/29/2013   Anemia 09/20/2013   Bilateral leg edema 08/29/2013   Essential hypertension, benign    Hypothyroidism    Persistent atrial fibrillation     Guadelupe Sabin, OTR/L  [phone] 05/06/2021, 5:45 PM  [place], [place], [address] Phone:  [phone]   Fax:  [phone]  Name: TIRZAH FROSS MRN: [id number] Date of Birth: 1945-09-12

## 2021-05-06 NOTE — Patient Instructions (Signed)
Repeat all exercises 10-15 times, 1-2 times per day.  1) Shoulder Protraction    Begin with elbows by your side, slowly "punch" straight out in front of you.     2) Horizontal abduction/adduction  Supine:   Standing:           Begin with arms straight out in front of you, bring out to the side in at "T" shape. Keep arms straight entire time.     3) Shoulder Abduction  Supine:     Standing:      Begin with arms down by your sides and bring out to the side and up towards your ears. Keep arms straight entire time.   4) Shoulder flexion with dowel rod     Begin with arms straight down holding cane or dowel rod with palms down, and bring up and in an overhead direction. Keep arms straight entire time.     5) Internal/External ROTATION   In the standing position, hold a wand/cane with both hands keeping your elbows bent. Move your arms and wand/cane to one side.  Your affected arm should be partially relaxed while your unaffected arm performs most of the effort.

## 2021-05-13 ENCOUNTER — Ambulatory Visit (HOSPITAL_COMMUNITY): Payer: Medicare HMO | Attending: Orthopedic Surgery | Admitting: Occupational Therapy

## 2021-05-13 ENCOUNTER — Other Ambulatory Visit: Payer: Self-pay

## 2021-05-13 ENCOUNTER — Encounter (HOSPITAL_COMMUNITY): Payer: Self-pay | Admitting: Occupational Therapy

## 2021-05-13 DIAGNOSIS — M25512 Pain in left shoulder: Secondary | ICD-10-CM | POA: Diagnosis present

## 2021-05-13 DIAGNOSIS — R29898 Other symptoms and signs involving the musculoskeletal system: Secondary | ICD-10-CM | POA: Insufficient documentation

## 2021-05-13 DIAGNOSIS — M25511 Pain in right shoulder: Secondary | ICD-10-CM | POA: Insufficient documentation

## 2021-05-13 DIAGNOSIS — M25611 Stiffness of right shoulder, not elsewhere classified: Secondary | ICD-10-CM | POA: Insufficient documentation

## 2021-05-13 DIAGNOSIS — M25612 Stiffness of left shoulder, not elsewhere classified: Secondary | ICD-10-CM | POA: Insufficient documentation

## 2021-05-13 DIAGNOSIS — G8929 Other chronic pain: Secondary | ICD-10-CM | POA: Diagnosis present

## 2021-05-13 NOTE — Patient Instructions (Addendum)
1) (Home) Extension: Isometric / Bilateral Arm Retraction - Sitting ? ? ?Facing anchor, hold hands and elbow at shoulder height, with elbow bent.  Pull arms back to squeeze shoulder blades together. Repeat 10-15 times. 1-3 times/day.  ? ?2) (Clinic) Extension / Flexion (Assist) ? ? ?Face anchor, pull arms back, keeping elbow straight, and squeze shoulder blades together. ?Repeat 10-15 times. 1-3 times/day.  ? ?Copyright ? [employer]. All rights reserved.  ? ?3) (Home) Retraction: Row - Bilateral (Anchor) ? ? ?Facing anchor, arms reaching forward, pull hands toward stomach, keeping elbows bent and at your sides and pinching shoulder blades together. ?Repeat 10-15 times. 1-3 times/day.  ? ?Copyright ? [employer]. All rights reserved.  ? ? ? ?A/ROM and AA/ROM Exercises ?Repeat all exercises 10-15 times, 1-2 times per day. ?  ?1) Shoulder Protraction ?  ?  ?Begin with elbows by your side, slowly "punch" straight out in front of you.   ?  ?  ?2) Horizontal abduction/adduction ?  ?Supine: ?  ?Standing: ?  ?                                                                         ?  ?  ?Begin with arms straight out in front of you, bring out to the side in at "T" shape. Keep arms straight entire time.  ?  ?  ?  ?3) Shoulder Abduction ?  ?Supine:                                                Standing:  ?  ?  ?  ?Begin with arms down by your sides and bring out to the side and up towards your ears. Keep arms straight entire time.  ?  ?4) Shoulder flexion with dowel rod ?  ?  ?  ?Begin with arms straight down holding cane or dowel rod with palms down, and bring up and in an overhead direction. Keep arms straight entire time.  ?  ?  ?  ?5) Internal/External ROTATION  ?  ?In the standing position, hold a wand/cane with both hands keeping your elbows bent. Move your arms and wand/cane to one side.  Your affected arm should be partially relaxed while your unaffected arm performs most of the effort.   ?  ? ?

## 2021-05-13 NOTE — Therapy (Signed)
[place], [place], [address] Phone: [phone]   Fax:  [phone]  Occupational Therapy Reassessment, Treatment, Discharge Summary  Patient Details  Name: Diane Cohen MRN: [id number] Date of Birth: Jan 12, 1946 Referring Provider (OT): Arther Abbott, MD   Encounter Date: 05/13/2021   OT End of Session - 05/13/21 1513     Visit Number 4    Number of Visits 4    Date for OT Re-Evaluation 05/14/21    Authorization Type [employer]    Authorization Time Period $20 copay, no visit limit    Progress Note Due on Visit 10    OT Start Time 1347    OT Stop Time 1418    OT Time Calculation (min) 31 min    Activity Tolerance Patient tolerated treatment well    Behavior During Therapy [facility] for tasks assessed/performed             Past Medical History:  Diagnosis Date   Anxiety    Atrial fibrillation ([place])    Atrial fibrillation ablation in 2019 - Dr. Rayann Heman   Chronic diastolic heart failure ([place])    Depression    Essential hypertension    History of TIA (transient ischemic attack)    Hyperlipidemia    Hypothyroidism    Pneumonia     Past Surgical History:  Procedure Laterality Date   ABDOMINAL HYSTERECTOMY     ABLATION OF DYSRHYTHMIC FOCUS  07/06/2017   ATRIAL FIBRILLATION ABLATION N/A 07/06/2017   Procedure: ATRIAL FIBRILLATION ABLATION;  Surgeon: Thompson Grayer, MD;  Location: [place] [facility];  Service: Cardiovascular;  Laterality: N/A;   BREAST SURGERY     biopsy   CARDIOVERSION N/A 01/23/2016   Procedure: CARDIOVERSION;  Surgeon: Satira Sark, MD;  [facility];  Service: Cardiovascular;  Laterality: N/A;   CARDIOVERSION N/A 11/13/2016   Procedure: CARDIOVERSION;  Surgeon: Arnoldo Lenis, MD;  [facility];  Service: Endoscopy;  Laterality: N/A;   CARDIOVERSION N/A 04/20/2018   Procedure: CARDIOVERSION;  Surgeon: [name], MD;  Location: [facility] ENDOSCOPY;   Service: Cardiovascular;  Laterality: N/A;   CARDIOVERSION N/A 05/23/2020   Procedure: CARDIOVERSION;  Surgeon: Satira Sark, MD;  [facility];  Service: Cardiovascular;  Laterality: N/A;   CHOLECYSTECTOMY     COLONOSCOPY N/A 10/07/2018   Sigmoid and descending colon diverticulosis, three 6 to 15 mm polyps in rectum, ascending colon, and IC valve, one 6 mm polyp in cecum. (sessile serrated polyps and sessile serrated adenoma of ascending colon with focal high grade dysplasia.   coloscopy     ESOPHAGEAL DILATION  10/07/2018   Procedure: ESOPHAGEAL DILATION;  Surgeon: Daneil Dolin, MD;  [facility];  Service: Endoscopy;;   ESOPHAGOGASTRODUODENOSCOPY N/A 10/07/2018   normal esophagus s/p dilation   POLYPECTOMY  10/07/2018   Procedure: POLYPECTOMY;  Surgeon: Daneil Dolin, MD;  [facility];  Service: Endoscopy;;  Cecal polyps x 2 hot snare Ascending polyp x 1 Hot snare Rectal polyp x 1   TEE WITHOUT CARDIOVERSION  07/06/2017   TEE WITHOUT CARDIOVERSION N/A 07/06/2017   Procedure: TRANSESOPHAGEAL ECHOCARDIOGRAM (TEE);  Surgeon: Josue Hector, MD;  Location: [facility] ENDOSCOPY;  Service: Cardiovascular;  Laterality: N/A;    There were no vitals filed for this visit.   Subjective Assessment - 05/13/21 1349     Subjective  S: They just feel tight when I move them.  Currently in Pain? No/denies                [facility] OT Assessment - 05/13/21 1349       Assessment   Medical Diagnosis Bilateral chronic shoulder pain      Precautions   Precautions None      Palpation   Palpation comment Min/mod fascial restrictions noted in bilateral upper trapezius region.      AROM   Overall AROM Comments Assessed seated. IR/er adducted    AROM Assessment Site Shoulder    Right/Left Shoulder Right;Left    Right Shoulder Flexion 117 Degrees   105 previous; 130 passive   Right Shoulder ABduction 149 Degrees   110 previous; 125 passive   Right Shoulder  Internal Rotation 90 Degrees   same as previous   Right Shoulder External Rotation 45 Degrees   same as previous; 50 passive   Left Shoulder Flexion 132 Degrees   116 previous; 148 passive   Left Shoulder ABduction 155 Degrees   125 previous; 150 passive   Left Shoulder Internal Rotation 90 Degrees   same as previous   Left Shoulder External Rotation 45 Degrees   same as previous; 57 passive                     OT Treatments/Exercises (OP) - 05/13/21 1350       Exercises   Exercises Shoulder      Shoulder Exercises: Supine   Protraction PROM;5 reps    Horizontal ABduction PROM;5 reps    External Rotation PROM;5 reps    Internal Rotation PROM;5 reps    Flexion PROM;5 reps    ABduction PROM;5 reps      Shoulder Exercises: Standing   Extension Theraband;10 reps    Theraband Level (Shoulder Extension) Level 2 (Red)    Row Theraband;10 reps    Theraband Level (Shoulder Row) Level 2 (Red)    Retraction Theraband;10 reps    Theraband Level (Shoulder Retraction) Level 2 (Red)                    OT Education - 05/13/21 1408     Education Details red scapular theraband    Person(s) Educated Patient    Methods Explanation;Demonstration;Verbal cues;Handout;Tactile cues    Comprehension Returned demonstration;Verbalized understanding              OT Short Term Goals - 05/13/21 1406       OT SHORT TERM GOAL #1   Title Pt will be educated and independent with HEP in order to increase functional use of her BUEs and allow her to complete all daily tasks with minimal difficulty.    Time 4    Period Weeks    Status Achieved    Target Date 05/14/21      OT SHORT TERM GOAL #2   Title Patient will increase her BUE A/ROM ROM by 10 degrees or more where able in order to decrease the amount of difficulty when performing reaching tasks above shoulder level or behind her back.    Time 4    Period Weeks    Status Partially Met      OT SHORT TERM GOAL #3    Title Pt will decrease the fascial restrictions in both shoulders to minimal amount or less in order to increase the functional mobility needed to complete household related tasks.    Time 4    Period Weeks    Status Achieved  OT SHORT TERM GOAL #4   Title Pt will report a decrease in pain in both shoulders of approximately 3/10 or less when completing self care tasks such as bathing and dressing.    Time 4    Period Weeks    Status Achieved                      Plan - 05/13/21 1513     Clinical Impression Statement A: Reassessment completed this session, pt has met 3/4 goals and has partially met remaining goal. Pt reports she is able to sleep on her right (preferred) side now without waking due to pain. She is also able to reach overhead and behind back with greater ease. She is now having mostly tightness versus pain in BUE. HEP updated today and pt completing with good form. Pt is agreeable to discharge today with HEP.    Body Structure / Function / Physical Skills ADL;ROM;UE functional use;Pain;Fascial restriction    Plan P: Discharge pt    OT Home Exercise Plan eval : Shoulder stretches; 3/7: A/ROM, red scapular theraband    Consulted and Agree with Plan of Care Patient             Patient will benefit from skilled therapeutic intervention in order to improve the following deficits and impairments:   Body Structure / Function / Physical Skills: ADL, ROM, UE functional use, Pain, Fascial restriction       Visit Diagnosis: Other symptoms and signs involving the musculoskeletal system  Stiffness of left shoulder, not elsewhere classified  Stiffness of right shoulder, not elsewhere classified  Chronic left shoulder pain  Chronic right shoulder pain    Problem List Patient Active Problem List   Diagnosis Date Noted   Acute bilateral low back pain without sciatica    Atrial fibrillation with rapid ventricular response ([place]) 06/23/2019   History of  colonic polyps 12/13/2018   Constipation 12/13/2018   Abdominal pain, epigastric 12/13/2018   History of hiatal hernia 05/12/2018   Encounter for screening colonoscopy 05/12/2018   Acute on chronic diastolic CHF (congestive heart failure) ([place]) 04/20/2018   Swelling of limb 11/09/2017   Chronic diastolic heart failure ([facility]) 12/29/2013   Anemia 09/20/2013   Bilateral leg edema 08/29/2013   Essential hypertension, benign    Hypothyroidism    Persistent atrial fibrillation     Guadelupe Sabin, OTR/L  [phone] 05/13/2021, 3:29 PM  [place], [place], [address] Phone: [phone]   Fax:  [phone]  Name: Diane Cohen MRN: [id number] Date of Birth: [birth year]  OCCUPATIONAL THERAPY DISCHARGE SUMMARY  Visits from Start of Care: 4  Current functional level related to goals / functional outcomes: See above. Pt has met 3/4 goals and partially met one additional goal. Pt reports improvement with functional tasks, min pain noted during ADLs.    Remaining deficits: Some difficulty with reaching overhead with RUE.    Education / Equipment: HEP for ROM and strengthening   Patient agrees to discharge. Patient goals were met. Patient is being discharged due to meeting the stated rehab goals.Marland Kitchen

## 2021-05-15 ENCOUNTER — Other Ambulatory Visit: Payer: Self-pay

## 2021-05-15 DIAGNOSIS — R918 Other nonspecific abnormal finding of lung field: Secondary | ICD-10-CM

## 2021-05-18 ENCOUNTER — Other Ambulatory Visit: Payer: Self-pay | Admitting: Cardiology

## 2021-05-19 MED ORDER — RIVAROXABAN 15 MG PO TABS: 15.0000 mg | ORAL_TABLET | Freq: Every day | ORAL | 6 refills | Status: DC

## 2021-05-19 NOTE — Telephone Encounter (Signed)
Pt last saw [place], [place] on 12/04/20, last labs 04/18/21 Creat 1.25, age 76, weight 74.8kg, CrCl 45.92, based on CrCl pt is on appropriate dosage of Xarelto '15mg'$  QD per pt med list.  Will refuse refill request for '20mg'$  tablets and refill '15mg'$  Xarelto per med list.  ?

## 2021-06-16 DIAGNOSIS — H02831 Dermatochalasis of right upper eyelid: Secondary | ICD-10-CM | POA: Diagnosis not present

## 2021-06-16 DIAGNOSIS — H2513 Age-related nuclear cataract, bilateral: Secondary | ICD-10-CM | POA: Diagnosis not present

## 2021-06-16 DIAGNOSIS — H02834 Dermatochalasis of left upper eyelid: Secondary | ICD-10-CM | POA: Diagnosis not present

## 2021-07-02 ENCOUNTER — Ambulatory Visit (HOSPITAL_COMMUNITY): Payer: Medicare HMO

## 2021-08-21 ENCOUNTER — Telehealth: Payer: Self-pay | Admitting: Cardiology

## 2021-08-21 NOTE — Telephone Encounter (Signed)
Left a message for pt to call office regarding Levothyroxine refill.

## 2021-08-21 NOTE — Telephone Encounter (Signed)
*  STAT* If patient is at the pharmacy, call can be transferred to refill team.   1. Which medications need to be refilled? (please list name of each medication and dose if known)   levothyroxine (SYNTHROID) 125 MCG tablet    2. Which pharmacy/location (including street and city if local pharmacy) is medication to be sent to? [id number] - [place], [address]  3. Do they need a 30 day or 90 day supply?  90 day

## 2021-08-22 NOTE — Telephone Encounter (Signed)
Spoke to pt and verbalized that [facility] does not prescribe/fill Levothyroxine and this medication refill request should go to her PCP. Pt voiced understanding.

## 2021-09-08 ENCOUNTER — Other Ambulatory Visit: Payer: Self-pay | Admitting: Cardiology

## 2021-09-22 DIAGNOSIS — H40013 Open angle with borderline findings, low risk, bilateral: Secondary | ICD-10-CM | POA: Diagnosis not present

## 2021-09-22 DIAGNOSIS — H01002 Unspecified blepharitis right lower eyelid: Secondary | ICD-10-CM | POA: Diagnosis not present

## 2021-09-22 DIAGNOSIS — H25813 Combined forms of age-related cataract, bilateral: Secondary | ICD-10-CM | POA: Diagnosis not present

## 2021-09-22 DIAGNOSIS — H01001 Unspecified blepharitis right upper eyelid: Secondary | ICD-10-CM | POA: Diagnosis not present

## 2021-09-22 DIAGNOSIS — H01004 Unspecified blepharitis left upper eyelid: Secondary | ICD-10-CM | POA: Diagnosis not present

## 2021-10-14 IMAGING — CT CT ANGIO CHEST
2 of 6 series · 18 of 46 positions shown · IV contrast (omnipaque)
Comparison: CT angiogram chest March 31, 2013; chest radiograph
June 23, 2019

CLINICAL DATA: Shortness of breath with lower extremity edema.
Chest tightness.

EXAM:
CT ANGIOGRAPHY CHEST WITH CONTRAST
TECHNIQUE: Multidetector CT imaging of the chest was performed using the
standard protocol during bolus administration of intravenous
contrast. Multiplanar CT image reconstructions and MIPs were
obtained to evaluate the vascular anatomy.
CONTRAST:  100mL OMNIPAQUE IOHEXOL 350 MG/ML SOLN

[Series 5: pe axial thins · axial · 0.65mm/px · z∈[+1009,+1285]mm · 15 of 304 slices shown]
[im 14/304  lung]
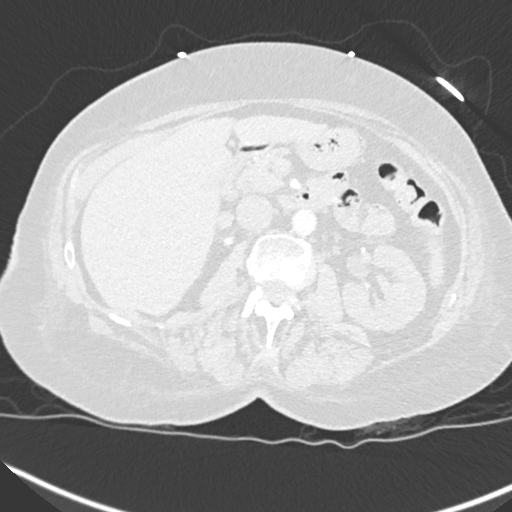
[im 40/304  soft-tissue]
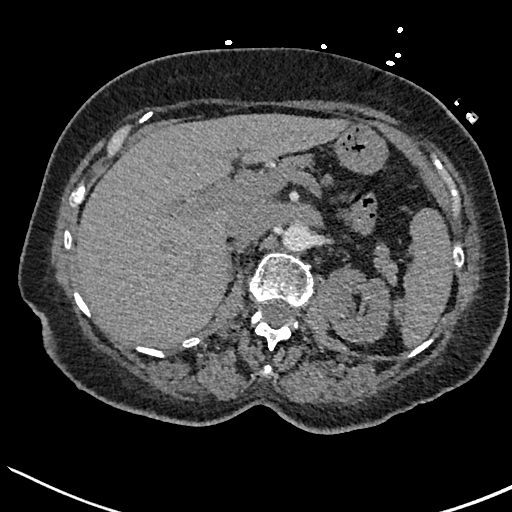
[im 53/304  lung]
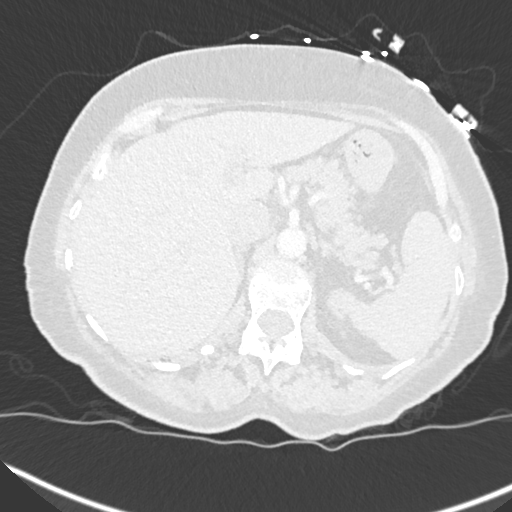
[im 80/304  soft-tissue]
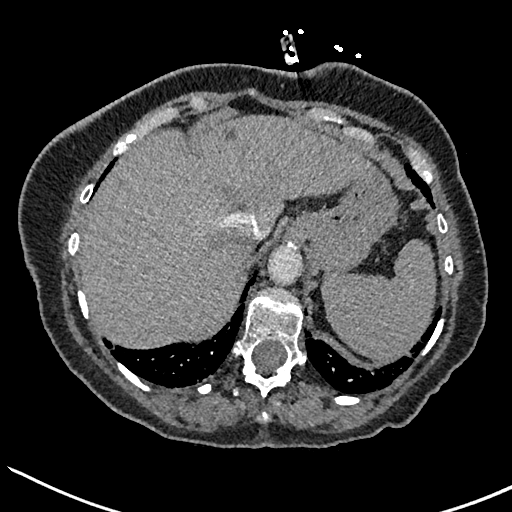
[im 93/304  lung]
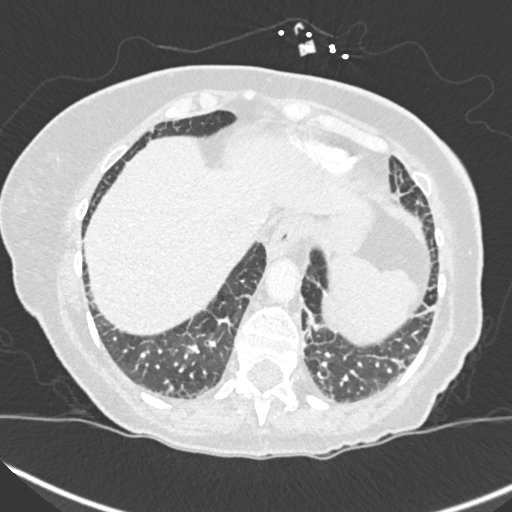
[im 119/304  soft-tissue]
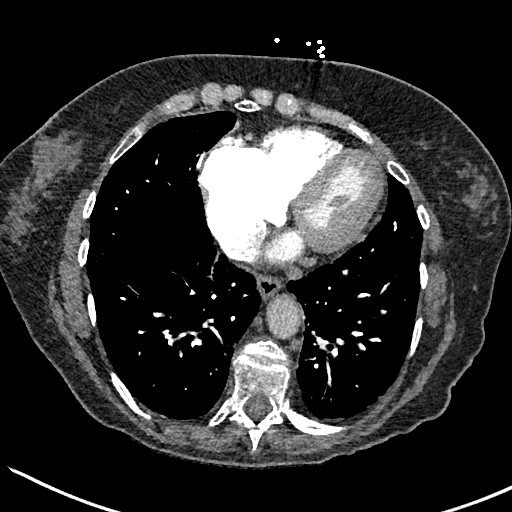
[im 132/304  lung]
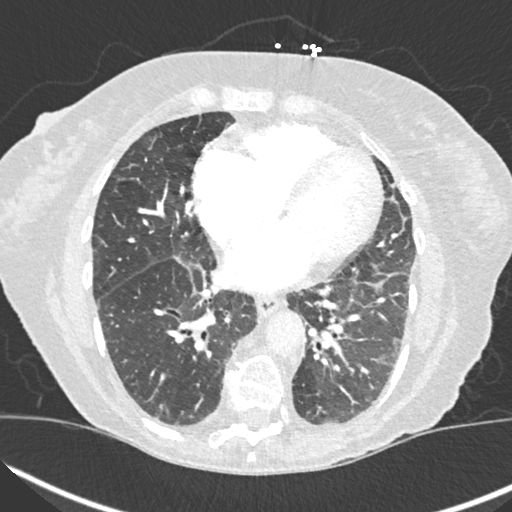
[im 159/304  soft-tissue]
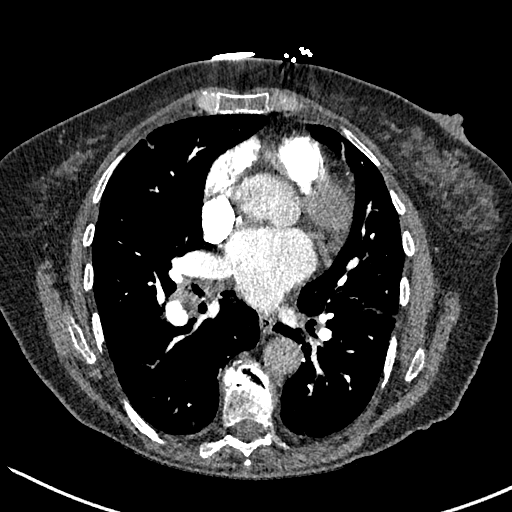
[im 172/304  lung]
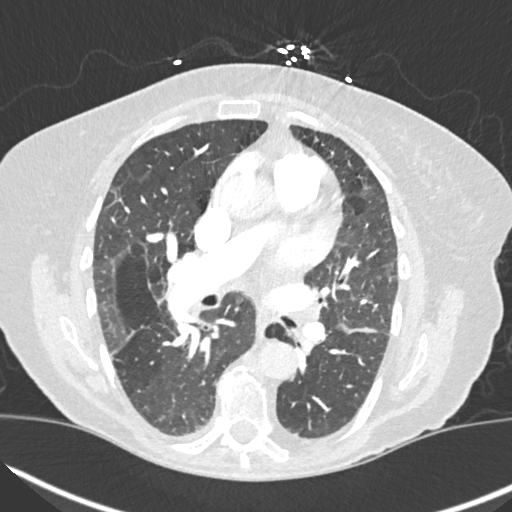
[im 185/304  soft-tissue]
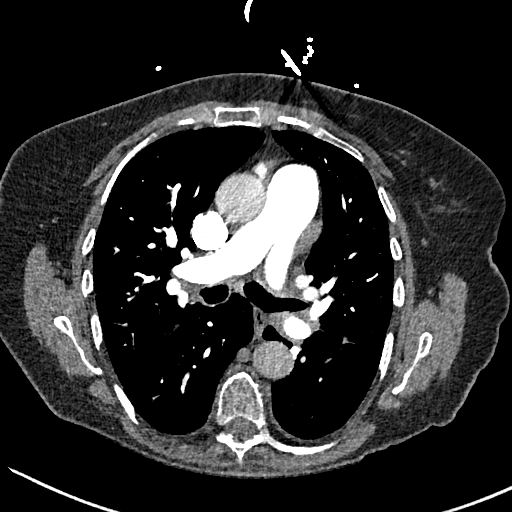
[im 211/304  lung]
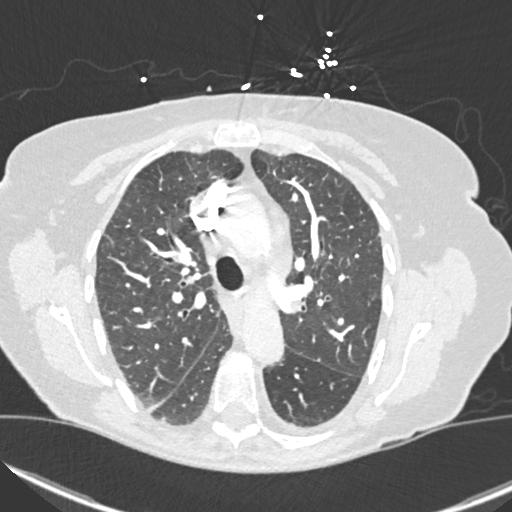
[im 224/304  soft-tissue]
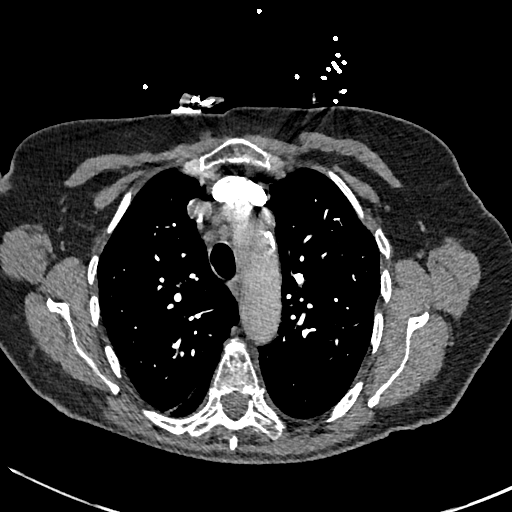
[im 251/304  lung]
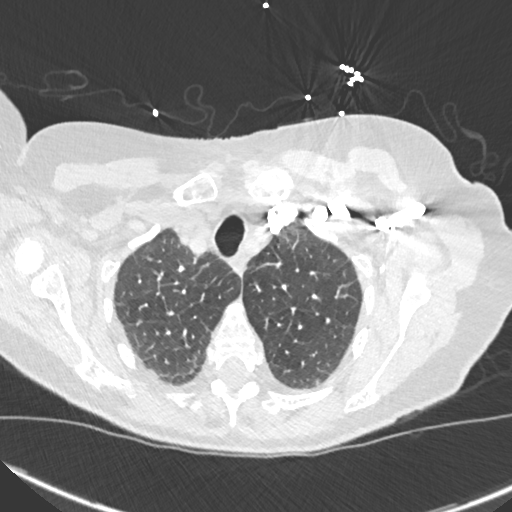
[im 264/304  soft-tissue]
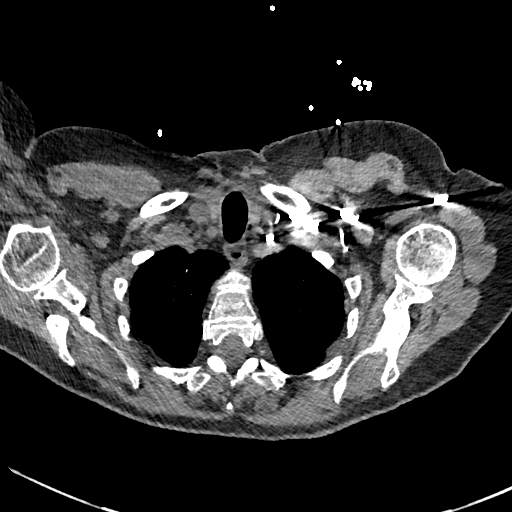
[im 290/304  lung]
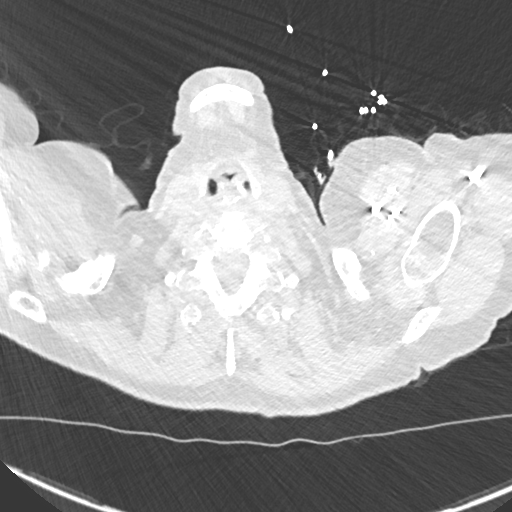

[Series 7: cor soft · coronal · 0.61mm/px · 3 of 151 slices shown]
[im 38/151  soft-tissue]
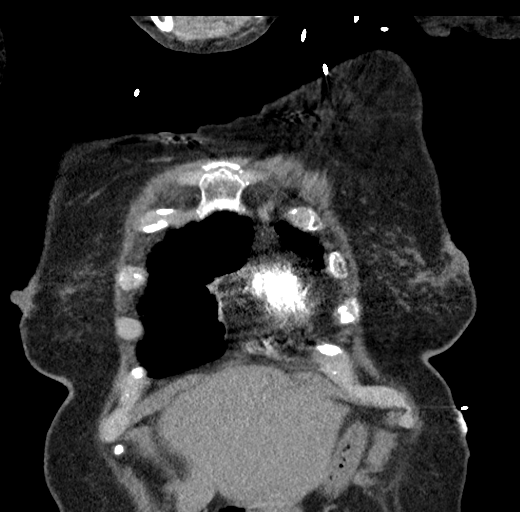
[im 76/151  soft-tissue]
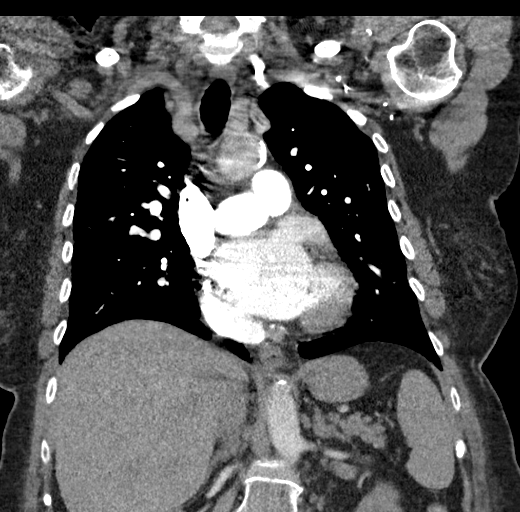
[im 113/151  soft-tissue]
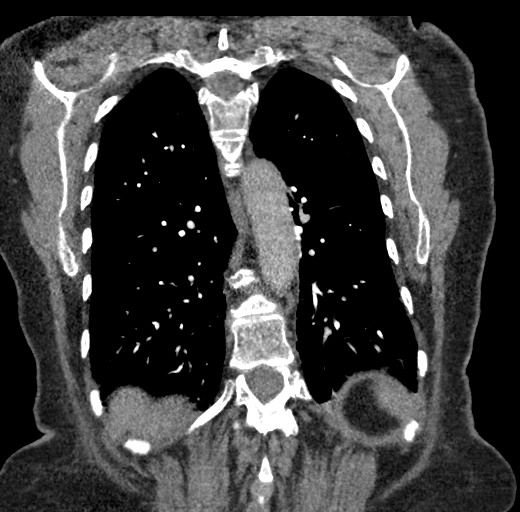

[18 of 46 positions shown; findings below may reference images not displayed]

FINDINGS: Cardiovascular: There is no demonstrable pulmonary embolus. There is
no thoracic aortic aneurysm. No thoracic aortic dissection. The
contrast bolus in the aorta is somewhat less than optimal for
dissection assessment. Visualized great vessels appear normal. There
are foci of aortic atherosclerosis. There are foci of coronary
artery calcification. There is no pericardial effusion or
pericardial thickening.

Mediastinum/Nodes: Thyroid appears unremarkable. There is a focal
lymph node in the right anterior precarinal region measuring 1.4 x
1.2 cm, enlarged but smaller than on previous study. No other lymph
node enlargement seen currently. There are several subcentimeter
mediastinal lymph nodes which do not meet size criteria for
pathologic significance. No esophageal lesions are demonstrable.

Lungs/Pleura: There is underlying centrilobular emphysematous
change. There are scattered areas of atelectatic change bilaterally
without edema or consolidation. No pleural effusions are evident.
There are areas of lobular septal thickening, primarily in the lower
lobes. No appreciable bronchiectasis.

Upper Abdomen: Gallbladder absent. Apparent cyst in the posterior
segment right lobe of the liver measuring 1.3 x 0.8 cm. There is a
cyst in the left lobe of the liver anteriorly measuring 0.8 x
cm. Visualized upper abdominal structures otherwise appear
unremarkable.

Musculoskeletal: There is degenerative change in the thoracic spine.
No blastic or lytic bone lesions. No chest wall lesions evident.

Review of the MIP images confirms the above findings.
IMPRESSION: 1. No demonstrable pulmonary embolus. No thoracic aortic aneurysm or
dissection. There is aortic atherosclerosis. There are foci of
coronary artery calcification.

2. Underlying centrilobular emphysematous change. Areas of mild
atelectasis. No edema or airspace opacity. Lobular septal thickening
in the bases likely represents chronic inflammation. No overt
pulmonary edema. No pleural effusions.

3. Prominent precarinal lymph node, smaller than on previous study.
No new adenopathy.

4.  Absent gallbladder.

Aortic Atherosclerosis (A9BLC-5E7.7) and Emphysema (A9BLC-B47.T).

## 2021-10-14 IMAGING — US US EXTREM LOW VENOUS*L*
1 series · 14 of 24 positions shown · non-contrast
Comparison: None.

CLINICAL DATA: 73-year-old female with leg swelling.

EXAM:
LEFT LOWER EXTREMITY VENOUS DOPPLER ULTRASOUND
TECHNIQUE: Gray-scale sonography with compression, as well as color and duplex
ultrasound, were performed to evaluate the deep venous system(s)
from the level of the common femoral vein through the popliteal and
proximal calf veins.

[Series 1: us extrem low venous*left* · 14 of 39 slices shown]
[im 1/39]
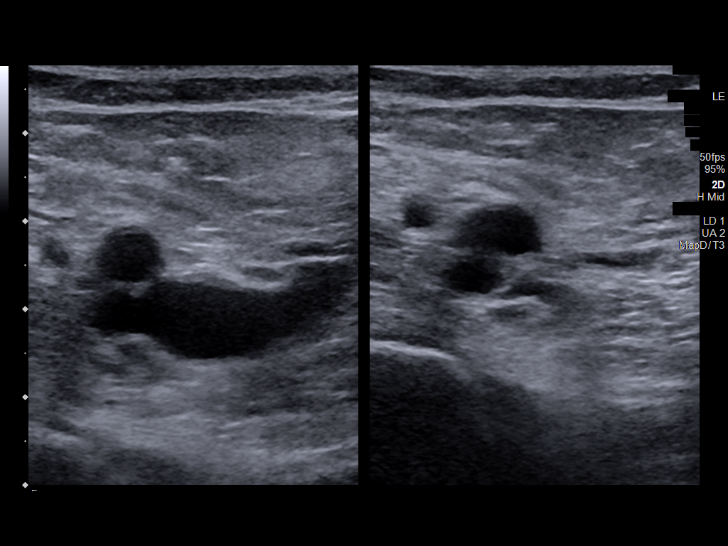
[im 4/39]
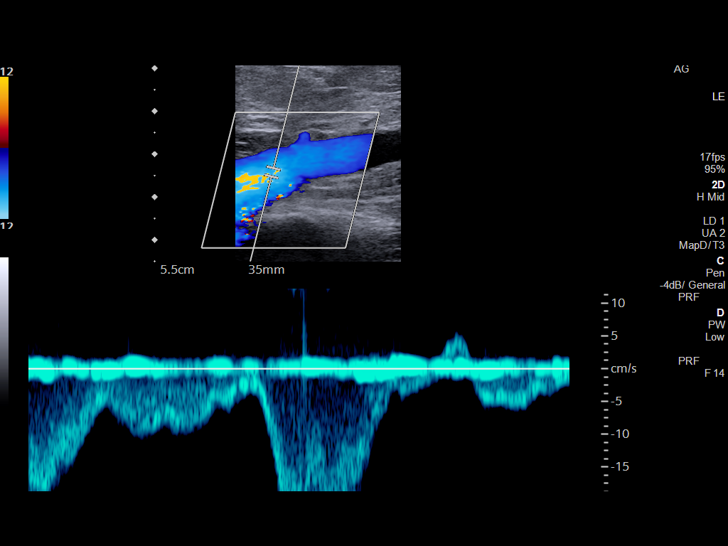
[im 7/39]
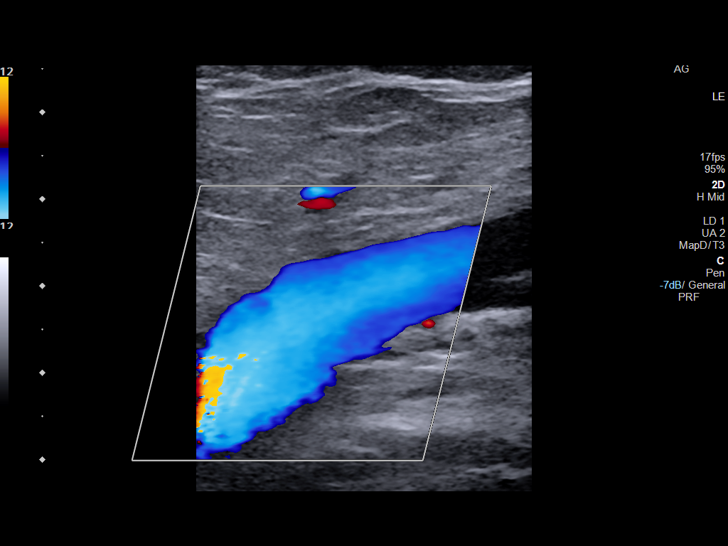
[im 10/39]
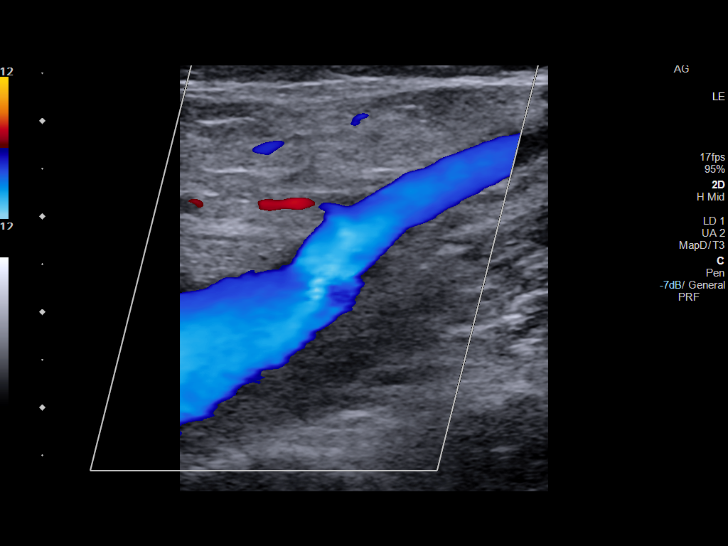
[im 12/39]
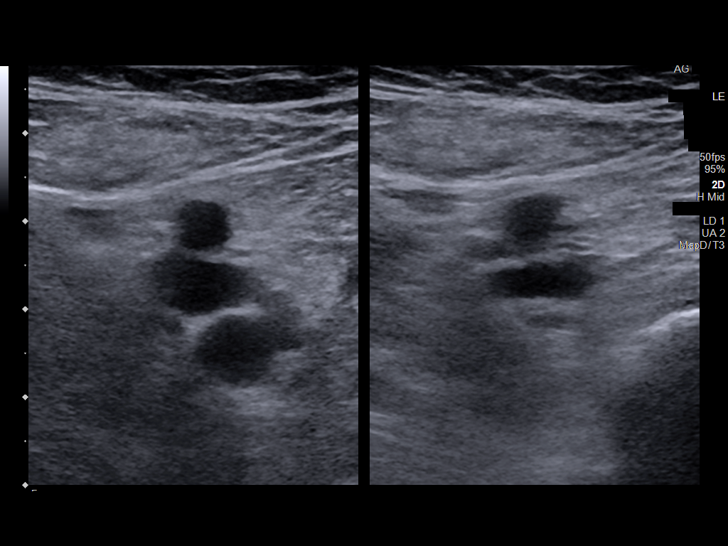
[im 15/39]
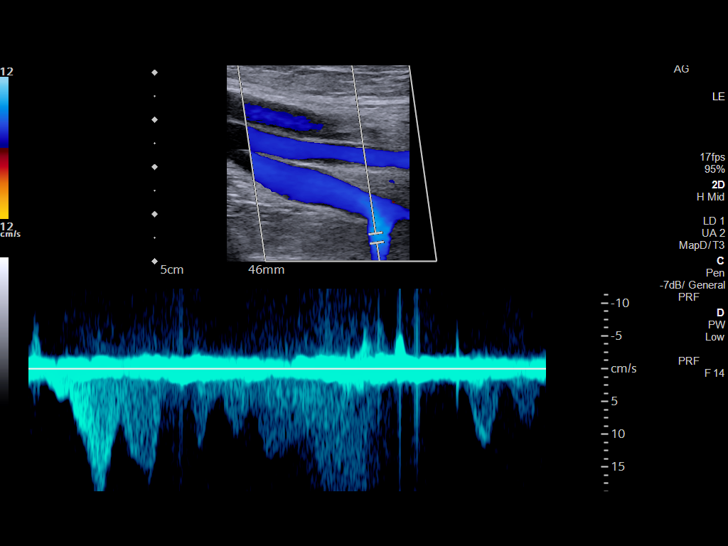
[im 19/39]
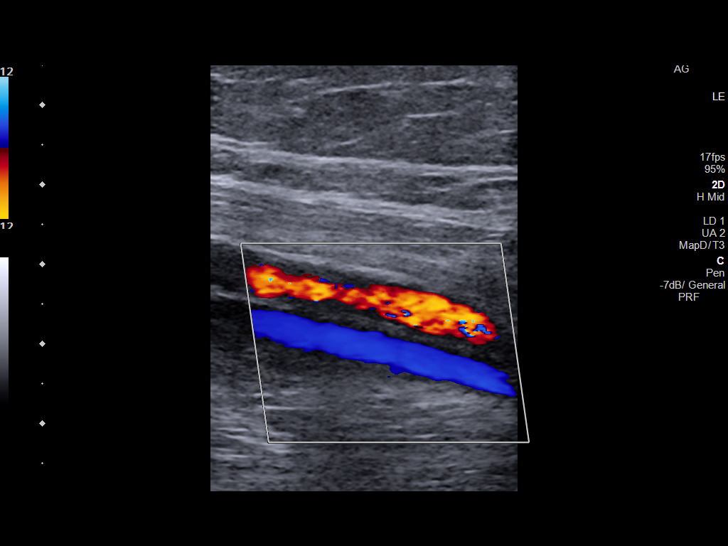
[im 20/39]
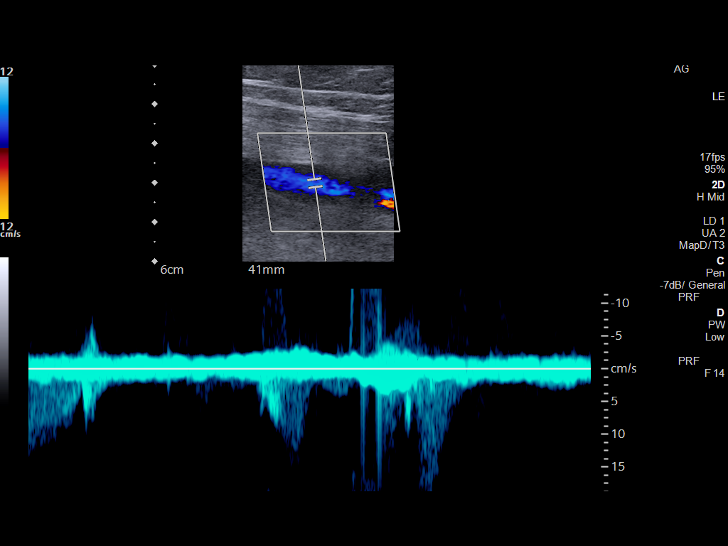
[im 24/39]
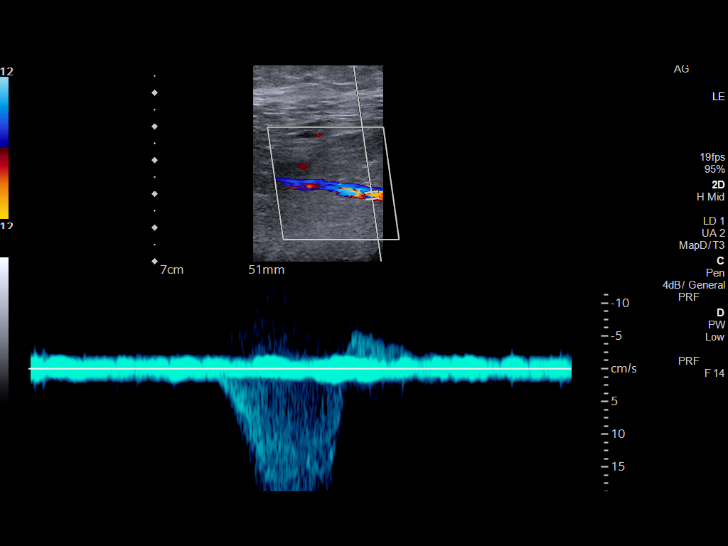
[im 27/39]
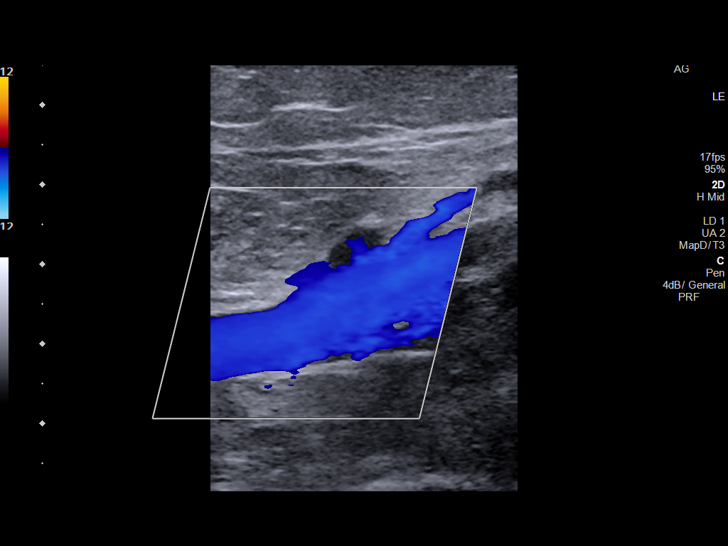
[im 30/39]
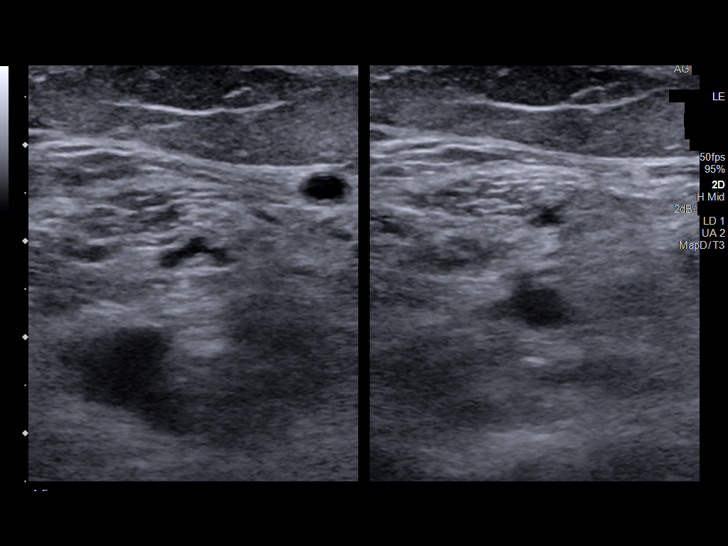
[im 32/39]
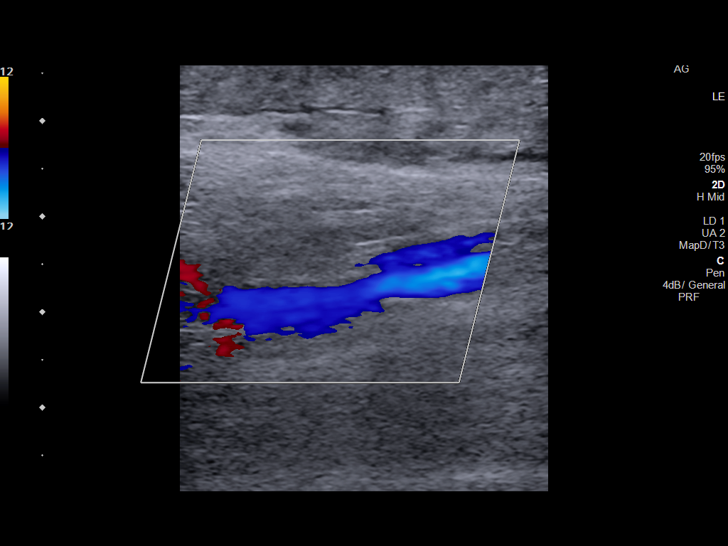
[im 35/39]
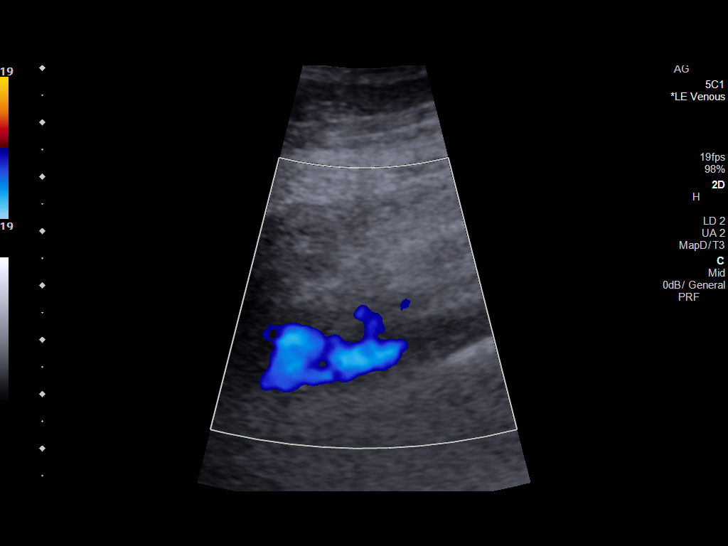
[im 39/39]
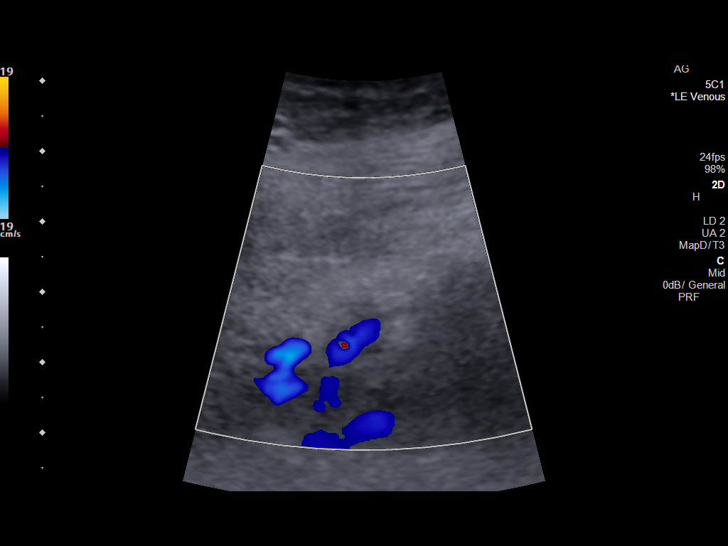

[14 of 24 positions shown; findings below may reference images not displayed]

FINDINGS: VENOUS

Normal compressibility of the common femoral, superficial femoral,
and popliteal veins, as well as the visualized calf veins.
Visualized portions of profunda femoral vein and great saphenous
vein unremarkable. No filling defects to suggest DVT on grayscale or
color Doppler imaging. Doppler waveforms show normal direction of
venous flow, normal respiratory phasicity and response to
augmentation.

Limited views of the contralateral common femoral vein are
unremarkable.

OTHER

Subcutaneous edema evident at the calf (image 32).

Limitations: none
IMPRESSION: No evidence of left lower extremity deep venous thrombosis.

## 2021-11-03 ENCOUNTER — Ambulatory Visit: Attending: Medical | Admitting: Medical

## 2021-11-03 ENCOUNTER — Encounter: Payer: Self-pay | Admitting: Medical

## 2021-11-03 VITALS — BP 140/78 | HR 103 | Ht 61.0 in | Wt 167.8 lb

## 2021-11-03 DIAGNOSIS — I5032 Chronic diastolic (congestive) heart failure: Secondary | ICD-10-CM | POA: Diagnosis not present

## 2021-11-03 DIAGNOSIS — I480 Paroxysmal atrial fibrillation: Secondary | ICD-10-CM | POA: Diagnosis not present

## 2021-11-03 DIAGNOSIS — I10 Essential (primary) hypertension: Secondary | ICD-10-CM

## 2021-11-03 MED ORDER — TORSEMIDE 20 MG PO TABS: 20.0000 mg | ORAL_TABLET | Freq: Every day | ORAL | 3 refills | Status: DC

## 2021-11-03 MED ORDER — METOPROLOL SUCCINATE ER 100 MG PO TB24: 100.0000 mg | ORAL_TABLET | Freq: Every day | ORAL | 3 refills | Status: DC

## 2021-11-03 MED ORDER — POTASSIUM CHLORIDE CRYS ER 20 MEQ PO TBCR: 20.0000 meq | EXTENDED_RELEASE_TABLET | Freq: Every day | ORAL | 3 refills | Status: DC

## 2021-11-03 NOTE — Progress Notes (Signed)
Cardiology Office Note:    Date:  11/03/2021   ID:  Diane Cohen, DOB 03/21/1945, MRN [id number]  PCP:  Celene Squibb, MD  [facility] Cardiologist:  Rozann Lesches, MD  [facility] Electrophysiologist:  Thompson Grayer, MD   Referring MD: Celene Squibb, MD   Chief Complaint: 6 month follow-up  History of Present Illness:    Diane Cohen is a 76 y.o. female with a hx of paroxysmal atrial fibrillation (s/p ablation in 2019, DCCV in 04/2018, repeat DCCV in 05/2020 after being started on Amiodarone), HFpEF, HTN, HLD, Hypothyroidism and history of TIA.  She has a history of A-fib with ablation in 2019. She has been on amiodarone long-term. Appears she stayed in sinus rhythm until 2020.  She has required subsequent cardioversions. She is on Xarelto.  She is on rate control with Cardizem and Toprol.  Echo 11/27/2019 showed LVEF 55 to 60%, no wall motion abnormalities, moderately dilated atrium, mild MR, moderate TR, moderate pulmonary hypertension.  Last seen 11/26/2020 for follow-up for bradycardia.  Cardizem and Toprol were both reduced with improvement of heart rate.  She reported chest pain and coronary CTA was ordered.  This showed a coronary calcium score of 62.9, 53rd percentile for age sex matched.  Mild atherosclerosis.  Was sent for FFR, which showed no significant stenosis.  Today, the patient is in afib with relatively controlled rates. HR 103bpm. She feels tired. No palpitations, chest pain or SOB. She used to be on cardizem '240mg'$  daily and Toprol '100mg'$  daily. Otherwise, no changes. No recent illness. Needs refill of torsemide '20mg'$  daily.   Past Medical History:  Diagnosis Date   Anxiety    Atrial fibrillation [facility])    Atrial fibrillation ablation in 2019 - Dr. Rayann Heman   Chronic diastolic heart failure ([place])    Depression    Essential hypertension    History of TIA (transient ischemic attack)    Hyperlipidemia    Hypothyroidism    Pneumonia     Past Surgical History:   Procedure Laterality Date   ABDOMINAL HYSTERECTOMY     ABLATION OF DYSRHYTHMIC FOCUS  07/06/2017   ATRIAL FIBRILLATION ABLATION N/A 07/06/2017   Procedure: ATRIAL FIBRILLATION ABLATION;  Surgeon: Thompson Grayer, MD;  Location: [place] [facility];  Service: Cardiovascular;  Laterality: N/A;   BREAST SURGERY     biopsy   CARDIOVERSION N/A 01/23/2016   Procedure: CARDIOVERSION;  Surgeon: Satira Sark, MD;  [facility];  Service: Cardiovascular;  Laterality: N/A;   CARDIOVERSION N/A 11/13/2016   Procedure: CARDIOVERSION;  Surgeon: Arnoldo Lenis, MD;  [facility];  Service: Endoscopy;  Laterality: N/A;   CARDIOVERSION N/A 04/20/2018   Procedure: CARDIOVERSION;  Surgeon: [name], MD;  Location: [facility] ENDOSCOPY;  Service: Cardiovascular;  Laterality: N/A;   CARDIOVERSION N/A 05/23/2020   Procedure: CARDIOVERSION;  Surgeon: Satira Sark, MD;  [facility];  Service: Cardiovascular;  Laterality: N/A;   CHOLECYSTECTOMY     COLONOSCOPY N/A 10/07/2018   Sigmoid and descending colon diverticulosis, three 6 to 15 mm polyps in rectum, ascending colon, and IC valve, one 6 mm polyp in cecum. (sessile serrated polyps and sessile serrated adenoma of ascending colon with focal high grade dysplasia.   coloscopy     ESOPHAGEAL DILATION  10/07/2018   Procedure: ESOPHAGEAL DILATION;  Surgeon: Daneil Dolin, MD;  [facility];  Service: Endoscopy;;   ESOPHAGOGASTRODUODENOSCOPY N/A 10/07/2018   normal esophagus s/p  dilation   POLYPECTOMY  10/07/2018   Procedure: POLYPECTOMY;  Surgeon: Daneil Dolin, MD;  [facility];  Service: Endoscopy;;  Cecal polyps x 2 hot snare Ascending polyp x 1 Hot snare Rectal polyp x 1   TEE WITHOUT CARDIOVERSION  07/06/2017   TEE WITHOUT CARDIOVERSION N/A 07/06/2017   Procedure: TRANSESOPHAGEAL ECHOCARDIOGRAM (TEE);  Surgeon: Josue Hector, MD;  Location: [facility] ENDOSCOPY;  Service: Cardiovascular;  Laterality:  N/A;    Current Medications: Current Meds  Medication Sig   acetaminophen (TYLENOL) 650 MG CR tablet Take 1,300 mg by mouth every 8 (eight) hours as needed for pain. Arthritis Strength   amiodarone (PACERONE) 200 MG tablet TAKE 1 TABLET(200 MG) BY MOUTH DAILY   diltiazem (CARDIZEM CD) 180 MG 24 hr capsule TAKE 1 CAPSULE(180 MG) BY MOUTH DAILY   diltiazem (CARDIZEM) 30 MG tablet Take 1 tablet (30 mg total) by mouth 4 (four) times daily as needed (for atrial fibrillation).   DULoxetine (CYMBALTA) 60 MG capsule Take 1 capsule (60 mg total) by mouth daily. (Patient taking differently: Take by mouth daily.)   levothyroxine (SYNTHROID) 125 MCG tablet Take 125 mcg by mouth daily before breakfast.    lisinopril (ZESTRIL) 10 MG tablet TAKE 1 TABLET(10 MG) BY MOUTH DAILY   magnesium oxide (MAG-OX) 400 MG tablet Take 1 tablet (400 mg total) by mouth daily.   metoprolol succinate (TOPROL-XL) 100 MG 24 hr tablet Take 1 tablet (100 mg total) by mouth daily. Take with or immediately following a meal.   pantoprazole (PROTONIX) 40 MG tablet TAKE 1 TABLET BY MOUTH DAILY 30 MINUTES BEFORE BREAKFAST   potassium chloride SA (KLOR-CON) 20 MEQ tablet Take 1 tablet (20 mEq total) by mouth daily.   Rivaroxaban (XARELTO) 15 MG TABS tablet Take 1 tablet (15 mg total) by mouth daily.   [DISCONTINUED] metoprolol succinate (TOPROL XL) 25 MG 24 hr tablet Take 1 tablet (25 mg total) by mouth daily.   [DISCONTINUED] torsemide (DEMADEX) 20 MG tablet Take 1 tablet (20 mg total) by mouth daily.     Allergies:   Levaquin [levofloxacin] and Sulfa antibiotics   Social History   Socioeconomic History   Marital status: Married    Spouse name: Not on file   Number of children: Not on file   Years of education: Not on file   Highest education level: Not on file  Occupational History   Not on file  Tobacco Use   Smoking status: Former    Packs/day: 0.50    Types: Cigarettes    Quit date: 01/22/1973    Years since  quitting: 48.8   Smokeless tobacco: Never  Vaping Use   Vaping Use: Never used  Substance and Sexual Activity   Alcohol use: Not Currently    Alcohol/week: 0.0 standard drinks of alcohol    Comment: 3 times a year: Christmas, Thanksgiving, and maybe just because    Drug use: No   Sexual activity: Yes    Birth control/protection: Surgical  Other Topics Concern   Not on file  Social History Narrative   Lives with spouse in [place] has ALS and is dependant on the patient for transition from bed to chair.   Social Determinants of Health   Financial Resource Strain: Not on file  Food Insecurity: Not on file  Transportation Needs: Not on file  Physical Activity: Not on file  Stress: Not on file  Social Connections: Not on file     Family History:  The patient's family history includes Anemia in her sister; Hypertension in her father, mother, sister, and another family member. There is no history of Colon cancer or Colon polyps.  ROS:   Please see the history of present illness.     All other systems reviewed and are negative.  EKGs/Labs/Other Studies Reviewed:    The following studies were reviewed today:  Cardiac CTA 12/2020  IMPRESSION: 1. Coronary calcium score of 62.9. This was 53rd percentile for age-, sex, and race-matched controls.   2.  Normal coronary origin with right dominance.   3.  Mild atherosclerosis.  CAD RADS 2.   4.  The study has been submitted for FFR analysis   FINDINGS: 1. Left Main: Low likelihood of hemodynamic significance. (FFR 0.98)   2. LAD: Low likelihood of hemodynamic significance. (FFR Prox 0.95, Mid 0.92, Distal 0.89) 3. LCX: Low likelihood of hemodynamic significance. (FFR Prox 0.98, Mid 0.97, Distal 0.96) 4. RCA: Low likelihood of hemodynamic significance. (FFR Prox 0.99, Mid 0.97, Distal 0.96)   IMPRESSION:   1.  CT FFR analysis didn't show any significant stenosis.   Fransico Him, MD  Echocardiogram:  11/2019 IMPRESSIONS     1. Left ventricular ejection fraction, by estimation, is 55 to 60%. The  left ventricle has normal function. The left ventricle has no regional  wall motion abnormalities. Left ventricular diastolic parameters are  indeterminate.   2. Right ventricular systolic function is normal. The right ventricular  size is mildly enlarged.   3. Left atrial size was moderately dilated.   4. Right atrial size was moderately dilated.   5. The mitral valve is normal in structure. Mild mitral valve  regurgitation. No evidence of mitral stenosis.   6. Tricuspid valve regurgitation is moderate.   7. The aortic valve is tricuspid. Aortic valve regurgitation is not  visualized. No aortic stenosis is present.   8. Moderate pulmonary HTN, PASP is 51 mmHg.   9. The inferior vena cava is normal in size with greater than 50%  respiratory variability, suggesting right atrial pressure of 3 mmHg.    EKG:  EKG is ordered today.  The ekg ordered today demonstrates Afib 103bpm, nonspecific T wave changes  Recent Labs: 04/18/2021: BUN 21; Creatinine, Ser 1.25; Potassium 4.3; Sodium 139  Recent Lipid Panel No results found for: "CHOL", "TRIG", "HDL", "CHOLHDL", "VLDL", "LDLCALC", "LDLDIRECT"   Physical Exam:    VS:  BP (!) 140/78   Pulse (!) 103   Ht '5\' 1"'$  (1.549 m)   Wt 167 lb 12.8 oz (76.1 kg)   SpO2 94%   BMI 31.71 kg/m     Wt Readings from Last 3 Encounters:  11/03/21 167 lb 12.8 oz (76.1 kg)  03/17/21 165 lb (74.8 kg)  12/04/20 162 lb 6.4 oz (73.7 kg)     GEN:  Well nourished, well developed in no acute distress HEENT: Normal NECK: No JVD; No carotid bruits LYMPHATICS: No lymphadenopathy CARDIAC: Irreg Irreg, tachy, no murmurs, rubs, gallops RESPIRATORY:  Clear to auscultation without rales, wheezing or rhonchi  ABDOMEN: Soft, non-tender, non-distended MUSCULOSKELETAL:  No edema; No deformity  SKIN: Warm and dry NEUROLOGIC:  Alert and oriented x 3 PSYCHIATRIC:   Normal affect   ASSESSMENT:    1. Paroxysmal atrial fibrillation (HCC)   2. Essential hypertension   3. Chronic diastolic heart failure (HCC)    PLAN:    In order of problems listed above:  Paroxysmal A-fib She is in Afib today with a heart rate  of 103bpm. She is asymptomatic for the most part. Cardizem and Toprol were previously decreased for bradycardia. She is also taking amiodarone '200mg'$  daily. I will increase Toprol to '100mg'$  daily. I recommended she monitor her heart rate at home and let us know if it gets too low. Continue Xarelto for stroke ppx.   HTN BP is mildly elevated. Increase Toprol as above. Continue Cardizem and Lisinopril.   Diastolic dysfunction She is euvolemic on exam today. Refill torsemide '20mg'$  daily.   Disposition: Follow up in 6 month(s) with MD/APP    Signed, Khyrin Trevathan Ninfa Meeker, PA-C  11/03/2021 2:03 PM    [place]

## 2021-11-03 NOTE — Patient Instructions (Signed)
Medication Instructions:  Your physician has recommended you make the following change in your medication:  Increase Toprol XL to 100 mg daily  Labwork: None  Testing/Procedures: None  Follow-Up: Follow up with Dr. Domenic Polite in 6 months.   Any Other Special Instructions Will Be Listed Below (If Applicable).  Check your heart rate at home and log it.    If you need a refill on your cardiac medications before your next appointment, please call your pharmacy.

## 2021-11-03 NOTE — Addendum Note (Signed)
Addended by: Christella Scheuermann C on: 11/03/2021 02:08 PM   Modules accepted: Orders

## 2021-11-11 ENCOUNTER — Encounter (HOSPITAL_COMMUNITY)
Admission: RE | Admit: 2021-11-11 | Discharge: 2021-11-11 | Disposition: A | Discharge status: Home / Self Care | Payer: Medicare HMO | Source: Ambulatory Visit | Attending: Ophthalmology | Admitting: Ophthalmology

## 2021-11-13 DIAGNOSIS — H25812 Combined forms of age-related cataract, left eye: Secondary | ICD-10-CM | POA: Diagnosis not present

## 2021-11-14 NOTE — H&P (Signed)
Surgical History & Physical  Patient Name: Diane Cohen DOB: 1946/02/24  Surgery: Cataract extraction with intraocular lens implant phacoemulsification; Left Eye  Surgeon: Baruch Goldmann MD Surgery Date:  11-17-21 Pre-Op Date:  11-06-21  HPI: A 22 Yr. old female patient The patient is here for a cataract evaluation of both eyes, referred by Dr. Hassell Done. The patient's vision is blurry. The condition's severity is constant. The complaint is associated with difficulty reading small print on medicine bottles/labels, driving at night due to halos/glare, and difficulty reading traffic signs/street signs. This is negatively affecting the patient's quality of life and the patient is unable to function adequately in life with the current level of vision. HPI was performed by Baruch Goldmann .  Medical History: Cataracts Macula Degeneration GERD Heart Problem High Blood Pressure LDL Thyroid Problems  Review of Systems Negative Allergic/Immunologic Negative Cardiovascular Negative Constitutional Negative Ear, Nose, Mouth & Throat Negative Endocrine Negative Eyes Negative Gastrointestinal Negative Genitourinary Negative Hemotologic/Lymphatic Negative Integumentary Negative Musculoskeletal Negative Neurological Negative Psychiatry Negative Respiratory  Social   Former smoker   Medication  Amiodarone, Duloxetine, Diltiazem, Lisinopril, Metoprolol, Potassium chloride, Torsemide, Xarelto,   Sx/Procedures  Lumpectomy (Breast), Hysterectomy, Gallbladder Sx,   Drug Allergies  Sulfa, Levaquin,   History & Physical: Heent: Cataract, left eye NECK: supple without bruits LUNGS: lungs clear to auscultation CV: regular rate and rhythm Abdomen: soft and non-tender Impression & Plan: Assessment: 1.  COMBINED FORMS AGE RELATED CATARACT; Both Eyes (H25.813) 2.  BLEPHARITIS; Right Upper Lid, Right Lower Lid, Left Upper Lid, Left Lower Lid (H01.001, H01.002,H01.004,H01.005) 3.   DERMATOCHALASIS, no surgery; Right Upper Lid, Left Upper Lid (H02.831, H02.834) 4.  Pinguecula; Both Eyes (H11.153) 5.  OAG BORDERLINE FINDINGS LOW RISK; Both Eyes (H40.013) 6.  Epiretinal Membrane; Left Eye (H35.372) 7.  chronic CONJUNCTIVITIS ALLERGIC CHRONIC OTHER (H10.45) 8.  ASTIGMATISM, REGULAR; Both Eyes (H52.223)  Plan: 1.  Cataract accounts for the patient's decreased vision. This visual impairment is not correctable with a tolerable change in glasses or contact lenses. Cataract surgery with an implantation of a new lens should significantly improve the visual and functional status of the patient. Discussed all risks, benefits, alternatives, and potential complications. Discussed the procedures and recovery. Patient desires to have surgery. A-scan ordered and performed today for intra-ocular lens calculations. The surgery will be performed in order to improve vision for driving, reading, and for eye examinations. Recommend phacoemulsification with intra-ocular lens. Recommend Dextenza for post-operative pain and inflammation. Left Eye. Dilates well - shugarcaine by protocol. Allergic to levaquin - give tobramycin antibiotic with combo NSAID/Pred.  2.  Recommend regular lid cleaning.  3.  Asymptomatic, recommend observation for now. Findings, prognosis and treatment options reviewed.  4.  Observe; Artificial tears as needed for irritation.  5.  Based on cup-to-disc ratio. Negative Family history. OCT rNFL shows: WNL OU. Monitor.  6.  Very mild - observe.  7.  Pataday Extra-Strength, 1 drop 1x/day both eyes.  8.  Worse OD. Will recommend Toric IOL OD.

## 2021-11-17 ENCOUNTER — Encounter (HOSPITAL_COMMUNITY): Payer: Self-pay | Admitting: Ophthalmology

## 2021-11-17 ENCOUNTER — Ambulatory Visit (HOSPITAL_BASED_OUTPATIENT_CLINIC_OR_DEPARTMENT_OTHER): Payer: Medicare HMO | Admitting: Anesthesiology

## 2021-11-17 ENCOUNTER — Encounter (HOSPITAL_COMMUNITY)
Admission: RE | Disposition: A | Discharge status: Home / Self Care | Payer: Self-pay | Source: Ambulatory Visit | Attending: Ophthalmology

## 2021-11-17 ENCOUNTER — Ambulatory Visit (HOSPITAL_COMMUNITY): Payer: Medicare HMO | Admitting: Anesthesiology

## 2021-11-17 ENCOUNTER — Ambulatory Visit (HOSPITAL_COMMUNITY)
Admission: RE | Admit: 2021-11-17 | Discharge: 2021-11-17 | Disposition: A | Discharge status: Home / Self Care | Payer: Medicare HMO | Source: Ambulatory Visit | Attending: Ophthalmology | Admitting: Ophthalmology

## 2021-11-17 DIAGNOSIS — H02831 Dermatochalasis of right upper eyelid: Secondary | ICD-10-CM | POA: Insufficient documentation

## 2021-11-17 DIAGNOSIS — K219 Gastro-esophageal reflux disease without esophagitis: Secondary | ICD-10-CM | POA: Diagnosis not present

## 2021-11-17 DIAGNOSIS — I4891 Unspecified atrial fibrillation: Secondary | ICD-10-CM | POA: Diagnosis not present

## 2021-11-17 DIAGNOSIS — H43312 Vitreous membranes and strands, left eye: Secondary | ICD-10-CM | POA: Insufficient documentation

## 2021-11-17 DIAGNOSIS — I509 Heart failure, unspecified: Secondary | ICD-10-CM

## 2021-11-17 DIAGNOSIS — F32A Depression, unspecified: Secondary | ICD-10-CM | POA: Insufficient documentation

## 2021-11-17 DIAGNOSIS — Z87891 Personal history of nicotine dependence: Secondary | ICD-10-CM | POA: Diagnosis not present

## 2021-11-17 DIAGNOSIS — H259 Unspecified age-related cataract: Secondary | ICD-10-CM

## 2021-11-17 DIAGNOSIS — H25813 Combined forms of age-related cataract, bilateral: Secondary | ICD-10-CM | POA: Insufficient documentation

## 2021-11-17 DIAGNOSIS — I110 Hypertensive heart disease with heart failure: Secondary | ICD-10-CM | POA: Diagnosis not present

## 2021-11-17 DIAGNOSIS — H52203 Unspecified astigmatism, bilateral: Secondary | ICD-10-CM | POA: Insufficient documentation

## 2021-11-17 DIAGNOSIS — H25812 Combined forms of age-related cataract, left eye: Secondary | ICD-10-CM

## 2021-11-17 DIAGNOSIS — H11153 Pinguecula, bilateral: Secondary | ICD-10-CM | POA: Diagnosis not present

## 2021-11-17 DIAGNOSIS — H0100B Unspecified blepharitis left eye, upper and lower eyelids: Secondary | ICD-10-CM | POA: Diagnosis not present

## 2021-11-17 DIAGNOSIS — D649 Anemia, unspecified: Secondary | ICD-10-CM | POA: Insufficient documentation

## 2021-11-17 DIAGNOSIS — H1045 Other chronic allergic conjunctivitis: Secondary | ICD-10-CM | POA: Diagnosis not present

## 2021-11-17 DIAGNOSIS — K449 Diaphragmatic hernia without obstruction or gangrene: Secondary | ICD-10-CM | POA: Diagnosis not present

## 2021-11-17 DIAGNOSIS — E039 Hypothyroidism, unspecified: Secondary | ICD-10-CM | POA: Insufficient documentation

## 2021-11-17 DIAGNOSIS — H0100A Unspecified blepharitis right eye, upper and lower eyelids: Secondary | ICD-10-CM | POA: Diagnosis not present

## 2021-11-17 DIAGNOSIS — F419 Anxiety disorder, unspecified: Secondary | ICD-10-CM | POA: Insufficient documentation

## 2021-11-17 DIAGNOSIS — H02834 Dermatochalasis of left upper eyelid: Secondary | ICD-10-CM | POA: Insufficient documentation

## 2021-11-17 HISTORY — PX: CATARACT EXTRACTION W/PHACO: SHX586

## 2021-11-17 SURGERY — PHACOEMULSIFICATION, CATARACT, WITH IOL INSERTION
Anesthesia: Monitor Anesthesia Care | Site: Eye | Laterality: Left

## 2021-11-17 MED ORDER — ROCURONIUM BROMIDE 10 MG/ML (PF) SYRINGE
PREFILLED_SYRINGE | INTRAVENOUS | Status: AC
  Filled 2021-11-17: qty 10

## 2021-11-17 MED ORDER — TROPICAMIDE 1 % OP SOLN
1.0000 [drp] | OPHTHALMIC | Status: AC | PRN
  Administered 2021-11-17 (×3): 1 [drp] via OPHTHALMIC

## 2021-11-17 MED ORDER — LIDOCAINE HCL (PF) 2 % IJ SOLN
INTRAMUSCULAR | Status: AC
  Filled 2021-11-17: qty 5

## 2021-11-17 MED ORDER — SODIUM HYALURONATE 23MG/ML IO SOSY
PREFILLED_SYRINGE | INTRAOCULAR | Status: DC | PRN
  Administered 2021-11-17: 0.6 mL via INTRAOCULAR

## 2021-11-17 MED ORDER — STERILE WATER FOR IRRIGATION IR SOLN
Status: DC | PRN
  Administered 2021-11-17: 500 mL

## 2021-11-17 MED ORDER — SODIUM CHLORIDE 0.9% FLUSH
INTRAVENOUS | Status: DC | PRN
  Administered 2021-11-17: 5 mL via INTRAVENOUS

## 2021-11-17 MED ORDER — EPINEPHRINE PF 1 MG/ML IJ SOLN
INTRAOCULAR | Status: DC | PRN
  Administered 2021-11-17: 500 mL

## 2021-11-17 MED ORDER — POVIDONE-IODINE 5 % OP SOLN
OPHTHALMIC | Status: DC | PRN
  Administered 2021-11-17: 1 via OPHTHALMIC

## 2021-11-17 MED ORDER — PHENYLEPHRINE HCL 2.5 % OP SOLN
1.0000 [drp] | OPHTHALMIC | Status: AC | PRN
  Administered 2021-11-17 (×3): 1 [drp] via OPHTHALMIC

## 2021-11-17 MED ORDER — BSS IO SOLN
INTRAOCULAR | Status: DC | PRN
  Administered 2021-11-17: 15 mL via INTRAOCULAR

## 2021-11-17 MED ORDER — LIDOCAINE HCL 3.5 % OP GEL
1.0000 | Freq: Once | OPHTHALMIC | Status: AC
Start: 2021-11-17 — End: 2021-11-17
  Administered 2021-11-17: 1 via OPHTHALMIC

## 2021-11-17 MED ORDER — LIDOCAINE HCL (PF) 1 % IJ SOLN
INTRAOCULAR | Status: DC | PRN
  Administered 2021-11-17: 1 mL via OPHTHALMIC

## 2021-11-17 MED ORDER — MIDAZOLAM HCL 2 MG/2ML IJ SOLN
INTRAMUSCULAR | Status: DC | PRN
  Administered 2021-11-17: 1 mg via INTRAVENOUS

## 2021-11-17 MED ORDER — EPINEPHRINE PF 1 MG/ML IJ SOLN
INTRAMUSCULAR | Status: AC
  Filled 2021-11-17: qty 2

## 2021-11-17 MED ORDER — TETRACAINE HCL 0.5 % OP SOLN
1.0000 [drp] | OPHTHALMIC | Status: AC | PRN
  Administered 2021-11-17 (×3): 1 [drp] via OPHTHALMIC

## 2021-11-17 MED ORDER — MIDAZOLAM HCL 2 MG/2ML IJ SOLN
INTRAMUSCULAR | Status: AC
  Filled 2021-11-17: qty 2

## 2021-11-17 MED ORDER — SODIUM HYALURONATE 10 MG/ML IO SOLUTION
PREFILLED_SYRINGE | INTRAOCULAR | Status: DC | PRN
  Administered 2021-11-17: 0.85 mL via INTRAOCULAR

## 2021-11-17 SURGICAL SUPPLY — 16 items
CATARACT SUITE SIGHTPATH (MISCELLANEOUS) ×1 IMPLANT
CLOTH BEACON ORANGE TIMEOUT ST (SAFETY) ×1 IMPLANT
DRAPE HALF SHEET 40X57 (DRAPES) IMPLANT
EYE SHIELD UNIVERSAL CLEAR (GAUZE/BANDAGES/DRESSINGS) IMPLANT
FEE CATARACT SUITE SIGHTPATH (MISCELLANEOUS) ×1 IMPLANT
GLOVE BIOGEL PI IND STRL 7.0 (GLOVE) ×2 IMPLANT
LENS IOL RAYNER 23.5 (Intraocular Lens) ×1 IMPLANT
LENS IOL RAYONE EMV 23.5 (Intraocular Lens) IMPLANT
NDL HYPO 18GX1.5 BLUNT FILL (NEEDLE) ×1 IMPLANT
NEEDLE HYPO 18GX1.5 BLUNT FILL (NEEDLE) ×1 IMPLANT
PAD ARMBOARD 7.5X6 YLW CONV (MISCELLANEOUS) ×1 IMPLANT
RING MALYGIN 7.0 (MISCELLANEOUS) IMPLANT
SYR TB 1ML LL NO SAFETY (SYRINGE) ×1 IMPLANT
TAPE SURG TRANSPORE 1 IN (GAUZE/BANDAGES/DRESSINGS) IMPLANT
TAPE SURGICAL TRANSPORE 1 IN (GAUZE/BANDAGES/DRESSINGS) ×1
WATER STERILE IRR 500ML POUR (IV SOLUTION) IMPLANT

## 2021-11-17 NOTE — Discharge Instructions (Signed)
Please discharge patient when stable, will follow up today with Dr. Hali Balgobin at the [facility] [place] office immediately following discharge.  Leave shield in place until visit.  All paperwork with discharge instructions will be given at the office.  [facility] Address:  [address]  [place], [place] [address]  

## 2021-11-17 NOTE — Anesthesia Postprocedure Evaluation (Signed)
Anesthesia Post Note  Patient: RAMONICA GRIGG  Procedure(s) Performed: CATARACT EXTRACTION PHACO AND INTRAOCULAR LENS PLACEMENT (IOC) (Left: Eye)  Patient location during evaluation: Phase II Anesthesia Type: MAC Level of consciousness: awake and alert and oriented Pain management: pain level controlled Vital Signs Assessment: post-procedure vital signs reviewed and stable Respiratory status: spontaneous breathing, nonlabored ventilation and respiratory function stable Cardiovascular status: stable and blood pressure returned to baseline Postop Assessment: no apparent nausea or vomiting Anesthetic complications: no   No notable events documented.   Last Vitals:  Vitals:   11/17/21 1046 11/17/21 1149  BP: (!) 144/63 (!) 149/69  Pulse: (!) 56 60  Resp: 17 16  Temp: 36.8 C 36.6 C  SpO2: 100% 96%    Last Pain:  Vitals:   11/17/21 1149  TempSrc: Axillary  PainSc: 1                  Kaya Klausing C Ediel Unangst

## 2021-11-17 NOTE — Interval H&P Note (Signed)
History and Physical Interval Note:  11/17/2021 11:23 AM  Diane Cohen  has presented today for surgery, with the diagnosis of combined forms age related cataract; left.  The various methods of treatment have been discussed with the patient and family. After consideration of risks, benefits and other options for treatment, the patient has consented to  Procedure(s) with comments: CATARACT EXTRACTION PHACO AND INTRAOCULAR LENS PLACEMENT (IOC) (Left) - CDE: as a surgical intervention.  The patient's history has been reviewed, patient examined, no change in status, stable for surgery.  I have reviewed the patient's chart and labs.  Questions were answered to the patient's satisfaction.     Baruch Goldmann

## 2021-11-17 NOTE — Op Note (Signed)
Date of procedure: 11/17/21  Pre-operative diagnosis: Visually significant age-related combined cataract, Left Eye (H25.812)  Post-operative diagnosis: Visually significant age-related combined cataract, Left Eye (H25.812)  Procedure: Removal of cataract via phacoemulsification and insertion of intra-ocular lens Rayner [id number] +23.5D into the capsular bag of the Left Eye  Attending surgeon: Gerda Diss. Niyanna Asch, MD, MA  Anesthesia: MAC, Topical [place]  Complications: None  Estimated Blood Loss: <10m (minimal)  Specimens: None  Implants: As above  Indications:  Visually significant age-related cataract, Left Eye  Procedure:  The patient was seen and identified in the pre-operative area. The operative eye was identified and dilated.  The operative eye was marked.  Topical anesthesia was administered to the operative eye.     The patient was then to the operative suite and placed in the supine position.  A timeout was performed confirming the patient, procedure to be performed, and all other relevant information.   The patient's face was prepped and draped in the usual fashion for intra-ocular surgery.  A lid speculum was placed into the operative eye and the surgical microscope moved into place and focused.  An inferotemporal paracentesis was created using a 20 gauge paracentesis blade.  Shugarcaine was injected into the anterior chamber.  Viscoelastic was injected into the anterior chamber.  A temporal clear-corneal main wound incision was created using a 2.457mmicrokeratome.  A continuous curvilinear capsulorrhexis was initiated using an irrigating cystitome and completed using capsulorrhexis forceps.  Hydrodissection and hydrodeliniation were performed.  Viscoelastic was injected into the anterior chamber.  A phacoemulsification handpiece and a chopper as a second instrument were used to remove the nucleus and epinucleus. The irrigation/aspiration handpiece was used to remove any remaining  cortical material.   The capsular bag was reinflated with viscoelastic, checked, and found to be intact.  The intraocular lens was inserted into the capsular bag.  The irrigation/aspiration handpiece was used to remove any remaining viscoelastic.  The clear corneal wound and paracentesis wounds were then hydrated and checked with Weck-Cels to be watertight. The lid-speculum was removed.  The drape was removed.  The patient's face was cleaned with a wet and dry 4x4.    A clear shield was taped over the eye. The patient was taken to the post-operative care unit in good condition, having tolerated the procedure well.  Post-Op Instructions: [facility] and will receive all other orders and instructions.

## 2021-11-17 NOTE — Transfer of Care (Signed)
Immediate Anesthesia Transfer of Care Note  Patient: Diane Cohen  Procedure(s) Performed: CATARACT EXTRACTION PHACO AND INTRAOCULAR LENS PLACEMENT (IOC) (Left: Eye)  Patient Location: PACU  Anesthesia Type:MAC  Level of Consciousness: awake, alert , oriented and patient cooperative  Airway & Oxygen Therapy: Patient Spontanous Breathing  Post-op Assessment: Report given to RN, Post -op Vital signs reviewed and stable and Patient moving all extremities X 4  Post vital signs: Reviewed and stable  Last Vitals:  Vitals Value Taken Time  BP    Temp    Pulse    Resp    SpO2      Last Pain:  Vitals:   11/17/21 1046  TempSrc: Oral  PainSc: 0-No pain         Complications: No notable events documented.

## 2021-11-17 NOTE — Anesthesia Preprocedure Evaluation (Signed)
Anesthesia Evaluation  Patient identified by MRN, date of birth, ID band Patient awake    Reviewed: Allergy & Precautions, NPO status , Patient's Chart, lab work & pertinent test results, reviewed documented beta blocker date and time   Airway Mallampati: II  TM Distance: >3 FB Neck ROM: Full    Dental  (+) Dental Advisory Given, Caps   Pulmonary pneumonia, former smoker,    Pulmonary exam normal breath sounds clear to auscultation       Cardiovascular Exercise Tolerance: Good hypertension, Pt. on medications and Pt. on home beta blockers +CHF  + dysrhythmias Atrial Fibrillation  Rhythm:Regular Rate:Normal + Systolic murmurs    Neuro/Psych PSYCHIATRIC DISORDERS Anxiety Depression negative neurological ROS     GI/Hepatic Neg liver ROS, hiatal hernia, GERD  Medicated,  Endo/Other  Hypothyroidism   Renal/GU negative Renal ROS  negative genitourinary   Musculoskeletal negative musculoskeletal ROS (+)   Abdominal   Peds negative pediatric ROS (+)  Hematology  (+) Blood dyscrasia, anemia ,   Anesthesia Other Findings   Reproductive/Obstetrics negative OB ROS                             Anesthesia Physical Anesthesia Plan  ASA: 3  Anesthesia Plan: MAC   Post-op Pain Management: Minimal or no pain anticipated   Induction:   PONV Risk Score and Plan:   Airway Management Planned: Nasal Cannula and Natural Airway  Additional Equipment:   Intra-op Plan:   Post-operative Plan:   Informed Consent: I have reviewed the patients History and Physical, chart, labs and discussed the procedure including the risks, benefits and alternatives for the proposed anesthesia with the patient or authorized representative who has indicated his/her understanding and acceptance.     Dental advisory given  Plan Discussed with: CRNA and Surgeon  Anesthesia Plan Comments:         Anesthesia  Quick Evaluation

## 2021-11-20 ENCOUNTER — Encounter (HOSPITAL_COMMUNITY): Payer: Self-pay | Admitting: Ophthalmology

## 2021-11-24 DIAGNOSIS — H25811 Combined forms of age-related cataract, right eye: Secondary | ICD-10-CM | POA: Diagnosis not present

## 2021-11-24 NOTE — H&P (Signed)
Surgical History & Physical  Patient Name: Diane Cohen DOB: Nov 20, 1945  Surgery: Cataract extraction with intraocular lens implant phacoemulsification; Right Eye  Surgeon: Baruch Goldmann MD Surgery Date:  12-01-21 Pre-Op Date:  11-24-21  HPI: A 9 Yr. old female patient present for 1 week post op OS and preop OD sx 12/01/21. 1. The patient is returning after cataract surgery. The left eye is affected. Status post cataract surgery, which began 1 weeks ago: Since the last visit, the affected area is doing well. At times the vision will get blurry. Patient is following medication instructions: Prednisolone BID OS. 2. 2. Patient is having glare problems during the day and night because of OD. Difficulties reading small print on TV. Also, eyes are feeling more tired because of the difference in vision since sx. This is negatively affecting the patient's quality of life and the patient is unable to function adequately in life with the current level of vision. HPI was performed by Baruch Goldmann .  Medical History: Cataracts Macula Degeneration GERD Heart Problem High Blood Pressure LDL Thyroid Problems  Review of Systems Negative Allergic/Immunologic Negative Cardiovascular Negative Constitutional Negative Ear, Nose, Mouth & Throat Negative Endocrine Negative Eyes Negative Gastrointestinal Negative Genitourinary Negative Hemotologic/Lymphatic Negative Integumentary Negative Musculoskeletal Negative Neurological Negative Psychiatry Negative Respiratory  Social   Former smoker   Medication Tobramycin, Pred-Brom Combo,  Amiodarone, Duloxetine, Diltiazem, Lisinopril, Metoprolol, Potassium chloride, Torsemide, Xarelto,   Sx/Procedures Phaco c IOL OS,  Lumpectomy (Breast), Hysterectomy, Gallbladder Sx,   Drug Allergies  Sulfa, Levaquin,   History & Physical: Heent: Cataract, right eye NECK: supple without bruits LUNGS: lungs clear to auscultation CV: regular rate and  rhythm Abdomen: soft and non-tender Impression & Plan: Assessment: 1.  CATARACT EXTRACTION STATUS; Left Eye (Z98.42) 2.  COMBINED FORMS AGE RELATED CATARACT; Right Eye (H25.811) 3.  ASTIGMATISM, REGULAR; Both Eyes (H52.223) 4.  CONJUNCTIVOCHALASIS; Both Eyes (H85.277)  Plan: 1.  1 week after cataract surgery. Doing well with improved vision and normal eye pressure. Call with any problems or concerns. Stop Tobramycin. Pred-Brom combo 2x/day for 3 weeks.  2.  Allergic to levaquin - give tobramycin antibiotic with combo NSAID/Pred. Cataract accounts for the patient's decreased vision. This visual impairment is not correctable with a tolerable change in glasses or contact lenses. Cataract surgery with an implantation of a new lens should significantly improve the visual and functional status of the patient. Discussed all risks, benefits, alternatives, and potential complications. Discussed the procedures and recovery. Patient desires to have surgery. A-scan ordered and performed today for intra-ocular lens calculations. The surgery will be performed in order to improve vision for driving, reading, and for eye examinations. Recommend phacoemulsification with intra-ocular lens. Recommend Dextenza for post-operative pain and inflammation. Right Eye. Surgery required to correct imbalance of vision. Dilates well - shugarcaine by protocol.  3.  Worse OD. No Toric OD based on OS post-op refraction.  4.  Asymptomatic.

## 2021-11-25 ENCOUNTER — Encounter (HOSPITAL_COMMUNITY)
Admission: RE | Admit: 2021-11-25 | Discharge: 2021-11-25 | Disposition: A | Discharge status: Home / Self Care | Payer: Medicare HMO | Source: Ambulatory Visit | Attending: Ophthalmology | Admitting: Ophthalmology

## 2021-12-01 ENCOUNTER — Encounter (HOSPITAL_COMMUNITY)
Admission: RE | Disposition: A | Discharge status: Home / Self Care | Payer: Self-pay | Source: Ambulatory Visit | Attending: Ophthalmology

## 2021-12-01 ENCOUNTER — Encounter (HOSPITAL_COMMUNITY): Payer: Self-pay | Admitting: Ophthalmology

## 2021-12-01 ENCOUNTER — Ambulatory Visit (HOSPITAL_COMMUNITY): Payer: Medicare HMO | Admitting: Anesthesiology

## 2021-12-01 ENCOUNTER — Ambulatory Visit (HOSPITAL_COMMUNITY)
Admission: RE | Admit: 2021-12-01 | Discharge: 2021-12-01 | Disposition: A | Discharge status: Home / Self Care | Payer: Medicare HMO | Source: Ambulatory Visit | Attending: Ophthalmology | Admitting: Ophthalmology

## 2021-12-01 ENCOUNTER — Ambulatory Visit (HOSPITAL_BASED_OUTPATIENT_CLINIC_OR_DEPARTMENT_OTHER): Payer: Medicare HMO | Admitting: Anesthesiology

## 2021-12-01 DIAGNOSIS — H11823 Conjunctivochalasis, bilateral: Secondary | ICD-10-CM | POA: Insufficient documentation

## 2021-12-01 DIAGNOSIS — F418 Other specified anxiety disorders: Secondary | ICD-10-CM

## 2021-12-01 DIAGNOSIS — I509 Heart failure, unspecified: Secondary | ICD-10-CM | POA: Diagnosis not present

## 2021-12-01 DIAGNOSIS — Z87891 Personal history of nicotine dependence: Secondary | ICD-10-CM | POA: Insufficient documentation

## 2021-12-01 DIAGNOSIS — H25811 Combined forms of age-related cataract, right eye: Secondary | ICD-10-CM | POA: Diagnosis not present

## 2021-12-01 DIAGNOSIS — K449 Diaphragmatic hernia without obstruction or gangrene: Secondary | ICD-10-CM | POA: Diagnosis not present

## 2021-12-01 DIAGNOSIS — I110 Hypertensive heart disease with heart failure: Secondary | ICD-10-CM | POA: Diagnosis not present

## 2021-12-01 DIAGNOSIS — Z9842 Cataract extraction status, left eye: Secondary | ICD-10-CM | POA: Diagnosis not present

## 2021-12-01 DIAGNOSIS — F32A Depression, unspecified: Secondary | ICD-10-CM | POA: Insufficient documentation

## 2021-12-01 DIAGNOSIS — H52223 Regular astigmatism, bilateral: Secondary | ICD-10-CM | POA: Diagnosis not present

## 2021-12-01 HISTORY — PX: CATARACT EXTRACTION W/PHACO: SHX586

## 2021-12-01 SURGERY — PHACOEMULSIFICATION, CATARACT, WITH IOL INSERTION
Anesthesia: Monitor Anesthesia Care | Site: Eye | Laterality: Right

## 2021-12-01 MED ORDER — BSS IO SOLN
INTRAOCULAR | Status: DC | PRN
  Administered 2021-12-01: 15 mL via INTRAOCULAR

## 2021-12-01 MED ORDER — STERILE WATER FOR IRRIGATION IR SOLN
Status: DC | PRN
  Administered 2021-12-01: 500 mL

## 2021-12-01 MED ORDER — SODIUM HYALURONATE 23MG/ML IO SOSY
PREFILLED_SYRINGE | INTRAOCULAR | Status: DC | PRN
  Administered 2021-12-01: 0.6 mL via INTRAOCULAR

## 2021-12-01 MED ORDER — FENTANYL CITRATE (PF) 100 MCG/2ML IJ SOLN
INTRAMUSCULAR | Status: DC | PRN
  Administered 2021-12-01: 50 ug via INTRAVENOUS

## 2021-12-01 MED ORDER — EPINEPHRINE PF 1 MG/ML IJ SOLN
INTRAMUSCULAR | Status: AC
  Filled 2021-12-01: qty 2

## 2021-12-01 MED ORDER — TETRACAINE HCL 0.5 % OP SOLN
1.0000 [drp] | OPHTHALMIC | Status: AC | PRN
  Administered 2021-12-01 (×3): 1 [drp] via OPHTHALMIC

## 2021-12-01 MED ORDER — POVIDONE-IODINE 5 % OP SOLN
OPHTHALMIC | Status: DC | PRN
  Administered 2021-12-01: 1 via OPHTHALMIC

## 2021-12-01 MED ORDER — PHENYLEPHRINE HCL 2.5 % OP SOLN
1.0000 [drp] | OPHTHALMIC | Status: AC | PRN
  Administered 2021-12-01 (×3): 1 [drp] via OPHTHALMIC

## 2021-12-01 MED ORDER — MIDAZOLAM HCL 5 MG/5ML IJ SOLN
INTRAMUSCULAR | Status: DC | PRN
  Administered 2021-12-01: 1 mg via INTRAVENOUS

## 2021-12-01 MED ORDER — MIDAZOLAM HCL 2 MG/2ML IJ SOLN
INTRAMUSCULAR | Status: AC
  Filled 2021-12-01: qty 2

## 2021-12-01 MED ORDER — PHENYLEPHRINE-KETOROLAC 1-0.3 % IO SOLN
INTRAOCULAR | Status: AC
  Filled 2021-12-01: qty 4

## 2021-12-01 MED ORDER — FENTANYL CITRATE (PF) 100 MCG/2ML IJ SOLN
INTRAMUSCULAR | Status: AC
  Filled 2021-12-01: qty 2

## 2021-12-01 MED ORDER — LIDOCAINE HCL 3.5 % OP GEL
1.0000 | Freq: Once | OPHTHALMIC | Status: AC
  Administered 2021-12-01: 1 via OPHTHALMIC

## 2021-12-01 MED ORDER — TROPICAMIDE 1 % OP SOLN
1.0000 [drp] | OPHTHALMIC | Status: AC | PRN
  Administered 2021-12-01 (×3): 1 [drp] via OPHTHALMIC

## 2021-12-01 MED ORDER — SODIUM HYALURONATE 10 MG/ML IO SOLUTION
PREFILLED_SYRINGE | INTRAOCULAR | Status: DC | PRN
  Administered 2021-12-01: 0.85 mL via INTRAOCULAR

## 2021-12-01 MED ORDER — EPINEPHRINE PF 1 MG/ML IJ SOLN
INTRAOCULAR | Status: DC | PRN
  Administered 2021-12-01: 500 mL

## 2021-12-01 MED ORDER — LIDOCAINE HCL (PF) 1 % IJ SOLN
INTRAOCULAR | Status: DC | PRN
  Administered 2021-12-01: 1 mL via OPHTHALMIC

## 2021-12-01 SURGICAL SUPPLY — 16 items
CATARACT SUITE SIGHTPATH (MISCELLANEOUS) ×1 IMPLANT
CLOTH BEACON ORANGE TIMEOUT ST (SAFETY) ×1 IMPLANT
DRAPE HALF SHEET 40X57 (DRAPES) IMPLANT
EYE SHIELD UNIVERSAL CLEAR (GAUZE/BANDAGES/DRESSINGS) IMPLANT
FEE CATARACT SUITE SIGHTPATH (MISCELLANEOUS) ×1 IMPLANT
GLOVE BIOGEL PI IND STRL 7.0 (GLOVE) ×2 IMPLANT
LENS IOL RAYNER 24.0 (Intraocular Lens) ×1 IMPLANT
LENS IOL RAYONE EMV 24.0 (Intraocular Lens) IMPLANT
NDL HYPO 18GX1.5 BLUNT FILL (NEEDLE) ×1 IMPLANT
NEEDLE HYPO 18GX1.5 BLUNT FILL (NEEDLE) ×1 IMPLANT
PAD ARMBOARD 7.5X6 YLW CONV (MISCELLANEOUS) ×1 IMPLANT
RING MALYGIN 7.0 (MISCELLANEOUS) IMPLANT
SYR TB 1ML LL NO SAFETY (SYRINGE) ×1 IMPLANT
TAPE SURG TRANSPORE 1 IN (GAUZE/BANDAGES/DRESSINGS) IMPLANT
TAPE SURGICAL TRANSPORE 1 IN (GAUZE/BANDAGES/DRESSINGS) ×1
WATER STERILE IRR 500ML POUR (IV SOLUTION) IMPLANT

## 2021-12-01 NOTE — Anesthesia Postprocedure Evaluation (Signed)
Anesthesia Post Note  Patient: Diane Cohen  Procedure(s) Performed: CATARACT EXTRACTION PHACO AND INTRAOCULAR LENS PLACEMENT (IOC) (Right: Eye)  Patient location during evaluation: Phase II Anesthesia Type: MAC Level of consciousness: awake Pain management: pain level controlled Vital Signs Assessment: post-procedure vital signs reviewed and stable Respiratory status: spontaneous breathing and respiratory function stable Cardiovascular status: blood pressure returned to baseline and stable Postop Assessment: no headache and no apparent nausea or vomiting Anesthetic complications: no Comments: Late entry   No notable events documented.   Last Vitals:  Vitals:   12/01/21 1007 12/01/21 1029  BP:  (!) 169/82  Pulse: (!) 58 60  Resp: 16 16  Temp:  36.9 C  SpO2: 100% 98%    Last Pain:  Vitals:   12/01/21 1029  TempSrc: Oral  PainSc: 0-No pain                 Louann Sjogren

## 2021-12-01 NOTE — Discharge Instructions (Addendum)
Please discharge patient when stable, will follow up today with Dr. Wrzosek at the [facility] office immediately following discharge.  Leave shield in place until visit.  All paperwork with discharge instructions will be given at the office.  [facility] Address:  [address]  [place], [place] [address]  

## 2021-12-01 NOTE — Op Note (Signed)
Date of procedure: 12/01/21  Pre-operative diagnosis:  Visually significant combined form age-related cataract, Right Eye (H25.811)  Post-operative diagnosis:  Visually significant combined form age-related cataract, Right Eye (H25.811)  Procedure: Removal of cataract via phacoemulsification and insertion of intra-ocular lens Rayner [id number] +24.0D into the capsular bag of the Right Eye  Attending surgeon: Gerda Diss. Azalea Cedar, MD, MA  Anesthesia: MAC, Topical Akten  Complications: None  Estimated Blood Loss: <78m (minimal)  Specimens: None  Implants: As above  Indications:  Visually significant age-related cataract, Right Eye  Procedure:  The patient was seen and identified in the pre-operative area. The operative eye was identified and dilated.  The operative eye was marked.  Topical anesthesia was administered to the operative eye.     The patient was then to the operative suite and placed in the supine position.  A timeout was performed confirming the patient, procedure to be performed, and all other relevant information.   The patient's face was prepped and draped in the usual fashion for intra-ocular surgery.  A lid speculum was placed into the operative eye and the surgical microscope moved into place and focused.  A superotemporal paracentesis was created using a 20 gauge paracentesis blade.  Shugarcaine was injected into the anterior chamber.  Viscoelastic was injected into the anterior chamber.  A temporal clear-corneal main wound incision was created using a 2.441mmicrokeratome.  A continuous curvilinear capsulorrhexis was initiated using an irrigating cystitome and completed using capsulorrhexis forceps.  Hydrodissection and hydrodeliniation were performed.  Viscoelastic was injected into the anterior chamber.  A phacoemulsification handpiece and a chopper as a second instrument were used to remove the nucleus and epinucleus. The irrigation/aspiration handpiece was used to remove any  remaining cortical material.   The capsular bag was reinflated with viscoelastic, checked, and found to be intact.  The intraocular lens was inserted into the capsular bag.  The irrigation/aspiration handpiece was used to remove any remaining viscoelastic.  The clear corneal wound and paracentesis wounds were then hydrated and checked with Weck-Cels to be watertight.  The lid-speculum was removed.  The drape was removed.  The patient's face was cleaned with a wet and dry 4x4.  A clear shield was taped over the eye. The patient was taken to the post-operative care unit in good condition, having tolerated the procedure well.  Post-Op Instructions: The [facility] post-operative evaluation and will receive all other orders and instructions.

## 2021-12-01 NOTE — Anesthesia Preprocedure Evaluation (Signed)
Anesthesia Evaluation  Patient identified by MRN, date of birth, ID band Patient awake    Reviewed: Allergy & Precautions, H&P , NPO status , Patient's Chart, lab work & pertinent test results, reviewed documented beta blocker date and time   Airway Mallampati: II  TM Distance: >3 FB Neck ROM: full    Dental no notable dental hx.    Pulmonary neg pulmonary ROS, former smoker,    Pulmonary exam normal breath sounds clear to auscultation       Cardiovascular Exercise Tolerance: Good hypertension, +CHF  + dysrhythmias Atrial Fibrillation  Rhythm:irregular Rate:Normal     Neuro/Psych PSYCHIATRIC DISORDERS Anxiety Depression negative neurological ROS     GI/Hepatic Neg liver ROS, hiatal hernia,   Endo/Other  Hypothyroidism   Renal/GU negative Renal ROS  negative genitourinary   Musculoskeletal   Abdominal   Peds  Hematology  (+) Blood dyscrasia, anemia ,   Anesthesia Other Findings   Reproductive/Obstetrics negative OB ROS                             Anesthesia Physical Anesthesia Plan  ASA: 3  Anesthesia Plan: MAC   Post-op Pain Management:    Induction:   PONV Risk Score and Plan:   Airway Management Planned:   Additional Equipment:   Intra-op Plan:   Post-operative Plan:   Informed Consent: I have reviewed the patients History and Physical, chart, labs and discussed the procedure including the risks, benefits and alternatives for the proposed anesthesia with the patient or authorized representative who has indicated his/her understanding and acceptance.     Dental Advisory Given  Plan Discussed with: CRNA  Anesthesia Plan Comments:         Anesthesia Quick Evaluation

## 2021-12-01 NOTE — Transfer of Care (Signed)
Immediate Anesthesia Transfer of Care Note  Patient: Diane Cohen  Procedure(s) Performed: CATARACT EXTRACTION PHACO AND INTRAOCULAR LENS PLACEMENT (IOC) (Right: Eye)  Patient Location: Short Stay  Anesthesia Type:MAC  Level of Consciousness: awake, alert  and oriented  Airway & Oxygen Therapy: Patient Spontanous Breathing  Post-op Assessment: Report given to RN and Post -op Vital signs reviewed and stable  Post vital signs: Reviewed and stable  Last Vitals:  Vitals Value Taken Time  BP 169/82 12/01/21 1029  Temp 36.9 C 12/01/21 1029  Pulse 60 12/01/21 1029  Resp 16 12/01/21 1029  SpO2 98 % 12/01/21 1029    Last Pain:  Vitals:   12/01/21 1029  TempSrc: Oral  PainSc: 0-No pain         Complications: No notable events documented.

## 2021-12-01 NOTE — Interval H&P Note (Signed)
History and Physical Interval Note:  12/01/2021 10:03 AM  Diane Cohen  has presented today for surgery, with the diagnosis of combined forms age related cataract; right.  The various methods of treatment have been discussed with the patient and family. After consideration of risks, benefits and other options for treatment, the patient has consented to  Procedure(s) with comments: CATARACT EXTRACTION PHACO AND INTRAOCULAR LENS PLACEMENT (IOC) (Right) - CDE: as a surgical intervention.  The patient's history has been reviewed, patient examined, no change in status, stable for surgery.  I have reviewed the patient's chart and labs.  Questions were answered to the patient's satisfaction.     Baruch Goldmann

## 2021-12-02 ENCOUNTER — Encounter (HOSPITAL_COMMUNITY): Payer: Self-pay | Admitting: Ophthalmology

## 2021-12-09 NOTE — Addendum Note (Signed)
Addendum  created 12/09/21 1424 by Myna Bright, CRNA   Intraprocedure Staff edited

## 2021-12-12 DIAGNOSIS — Z136 Encounter for screening for cardiovascular disorders: Secondary | ICD-10-CM | POA: Diagnosis not present

## 2021-12-12 DIAGNOSIS — Z0000 Encounter for general adult medical examination without abnormal findings: Secondary | ICD-10-CM | POA: Diagnosis not present

## 2021-12-17 ENCOUNTER — Other Ambulatory Visit (HOSPITAL_COMMUNITY): Payer: Self-pay | Admitting: Internal Medicine

## 2021-12-17 ENCOUNTER — Encounter: Payer: Self-pay | Admitting: Internal Medicine

## 2021-12-17 ENCOUNTER — Other Ambulatory Visit: Payer: Self-pay | Admitting: Internal Medicine

## 2021-12-17 DIAGNOSIS — F418 Other specified anxiety disorders: Secondary | ICD-10-CM | POA: Insufficient documentation

## 2021-12-17 DIAGNOSIS — R918 Other nonspecific abnormal finding of lung field: Secondary | ICD-10-CM | POA: Insufficient documentation

## 2021-12-17 DIAGNOSIS — E559 Vitamin D deficiency, unspecified: Secondary | ICD-10-CM | POA: Insufficient documentation

## 2021-12-17 DIAGNOSIS — I5189 Other ill-defined heart diseases: Secondary | ICD-10-CM | POA: Insufficient documentation

## 2021-12-17 DIAGNOSIS — R7301 Impaired fasting glucose: Secondary | ICD-10-CM | POA: Insufficient documentation

## 2021-12-17 DIAGNOSIS — I10 Essential (primary) hypertension: Secondary | ICD-10-CM | POA: Diagnosis not present

## 2021-12-17 DIAGNOSIS — M1990 Unspecified osteoarthritis, unspecified site: Secondary | ICD-10-CM | POA: Insufficient documentation

## 2021-12-17 DIAGNOSIS — I700 Atherosclerosis of aorta: Secondary | ICD-10-CM | POA: Insufficient documentation

## 2021-12-17 DIAGNOSIS — I480 Paroxysmal atrial fibrillation: Secondary | ICD-10-CM | POA: Diagnosis not present

## 2021-12-17 DIAGNOSIS — H269 Unspecified cataract: Secondary | ICD-10-CM | POA: Insufficient documentation

## 2021-12-17 DIAGNOSIS — R001 Bradycardia, unspecified: Secondary | ICD-10-CM | POA: Insufficient documentation

## 2021-12-17 DIAGNOSIS — N289 Disorder of kidney and ureter, unspecified: Secondary | ICD-10-CM | POA: Insufficient documentation

## 2021-12-17 DIAGNOSIS — Z23 Encounter for immunization: Secondary | ICD-10-CM | POA: Diagnosis not present

## 2021-12-17 DIAGNOSIS — E039 Hypothyroidism, unspecified: Secondary | ICD-10-CM | POA: Diagnosis not present

## 2021-12-17 DIAGNOSIS — Z0000 Encounter for general adult medical examination without abnormal findings: Secondary | ICD-10-CM | POA: Diagnosis not present

## 2021-12-17 DIAGNOSIS — E782 Mixed hyperlipidemia: Secondary | ICD-10-CM | POA: Insufficient documentation

## 2021-12-18 ENCOUNTER — Other Ambulatory Visit (HOSPITAL_COMMUNITY): Payer: Self-pay | Admitting: Internal Medicine

## 2021-12-18 DIAGNOSIS — Z13820 Encounter for screening for osteoporosis: Secondary | ICD-10-CM

## 2021-12-18 DIAGNOSIS — Z1231 Encounter for screening mammogram for malignant neoplasm of breast: Secondary | ICD-10-CM

## 2022-01-01 DIAGNOSIS — Z0100 Encounter for examination of eyes and vision without abnormal findings: Secondary | ICD-10-CM | POA: Diagnosis not present

## 2022-01-01 DIAGNOSIS — H524 Presbyopia: Secondary | ICD-10-CM | POA: Diagnosis not present

## 2022-01-05 ENCOUNTER — Ambulatory Visit (HOSPITAL_COMMUNITY)
Admission: RE | Admit: 2022-01-05 | Discharge: 2022-01-05 | Disposition: A | Discharge status: Home / Self Care | Payer: Medicare HMO | Source: Ambulatory Visit | Attending: Internal Medicine | Admitting: Internal Medicine

## 2022-01-05 ENCOUNTER — Other Ambulatory Visit (HOSPITAL_COMMUNITY): Payer: Self-pay | Admitting: Internal Medicine

## 2022-01-05 DIAGNOSIS — Z13820 Encounter for screening for osteoporosis: Secondary | ICD-10-CM

## 2022-01-05 DIAGNOSIS — Z780 Asymptomatic menopausal state: Secondary | ICD-10-CM | POA: Diagnosis not present

## 2022-01-05 DIAGNOSIS — M8589 Other specified disorders of bone density and structure, multiple sites: Secondary | ICD-10-CM | POA: Insufficient documentation

## 2022-01-05 DIAGNOSIS — Z1231 Encounter for screening mammogram for malignant neoplasm of breast: Secondary | ICD-10-CM | POA: Insufficient documentation

## 2022-01-07 ENCOUNTER — Other Ambulatory Visit (HOSPITAL_COMMUNITY): Payer: Self-pay | Admitting: Internal Medicine

## 2022-01-07 DIAGNOSIS — N6320 Unspecified lump in the left breast, unspecified quadrant: Secondary | ICD-10-CM

## 2022-01-13 ENCOUNTER — Ambulatory Visit (HOSPITAL_COMMUNITY)
Admission: RE | Admit: 2022-01-13 | Discharge: 2022-01-13 | Disposition: A | Discharge status: Home / Self Care | Payer: Medicare HMO | Source: Ambulatory Visit | Attending: Internal Medicine | Admitting: Internal Medicine

## 2022-01-13 DIAGNOSIS — I5032 Chronic diastolic (congestive) heart failure: Secondary | ICD-10-CM | POA: Diagnosis not present

## 2022-01-13 DIAGNOSIS — N6489 Other specified disorders of breast: Secondary | ICD-10-CM | POA: Diagnosis not present

## 2022-01-13 DIAGNOSIS — N6320 Unspecified lump in the left breast, unspecified quadrant: Secondary | ICD-10-CM | POA: Insufficient documentation

## 2022-01-13 DIAGNOSIS — R944 Abnormal results of kidney function studies: Secondary | ICD-10-CM | POA: Diagnosis not present

## 2022-01-13 DIAGNOSIS — I480 Paroxysmal atrial fibrillation: Secondary | ICD-10-CM | POA: Diagnosis not present

## 2022-01-13 DIAGNOSIS — Z1239 Encounter for other screening for malignant neoplasm of breast: Secondary | ICD-10-CM | POA: Diagnosis not present

## 2022-01-13 DIAGNOSIS — E039 Hypothyroidism, unspecified: Secondary | ICD-10-CM | POA: Diagnosis not present

## 2022-01-13 DIAGNOSIS — I700 Atherosclerosis of aorta: Secondary | ICD-10-CM | POA: Diagnosis not present

## 2022-01-13 DIAGNOSIS — R922 Inconclusive mammogram: Secondary | ICD-10-CM | POA: Diagnosis not present

## 2022-01-13 DIAGNOSIS — M8580 Other specified disorders of bone density and structure, unspecified site: Secondary | ICD-10-CM | POA: Insufficient documentation

## 2022-01-13 DIAGNOSIS — E782 Mixed hyperlipidemia: Secondary | ICD-10-CM | POA: Diagnosis not present

## 2022-01-13 DIAGNOSIS — D649 Anemia, unspecified: Secondary | ICD-10-CM | POA: Diagnosis not present

## 2022-01-13 DIAGNOSIS — R001 Bradycardia, unspecified: Secondary | ICD-10-CM | POA: Diagnosis not present

## 2022-01-13 DIAGNOSIS — I10 Essential (primary) hypertension: Secondary | ICD-10-CM | POA: Diagnosis not present

## 2022-01-13 DIAGNOSIS — F418 Other specified anxiety disorders: Secondary | ICD-10-CM | POA: Diagnosis not present

## 2022-01-13 DIAGNOSIS — H25013 Cortical age-related cataract, bilateral: Secondary | ICD-10-CM | POA: Diagnosis not present

## 2022-01-13 DIAGNOSIS — R7301 Impaired fasting glucose: Secondary | ICD-10-CM | POA: Diagnosis not present

## 2022-01-13 DIAGNOSIS — R9230 Dense breasts, unspecified: Secondary | ICD-10-CM | POA: Diagnosis not present

## 2022-01-13 DIAGNOSIS — R928 Other abnormal and inconclusive findings on diagnostic imaging of breast: Secondary | ICD-10-CM | POA: Insufficient documentation

## 2022-01-13 DIAGNOSIS — E559 Vitamin D deficiency, unspecified: Secondary | ICD-10-CM | POA: Diagnosis not present

## 2022-04-10 DIAGNOSIS — E039 Hypothyroidism, unspecified: Secondary | ICD-10-CM | POA: Diagnosis not present

## 2022-04-10 DIAGNOSIS — E782 Mixed hyperlipidemia: Secondary | ICD-10-CM | POA: Diagnosis not present

## 2022-04-10 DIAGNOSIS — R7301 Impaired fasting glucose: Secondary | ICD-10-CM | POA: Diagnosis not present

## 2022-04-16 ENCOUNTER — Encounter (HOSPITAL_COMMUNITY): Payer: Self-pay | Admitting: *Deleted

## 2022-05-07 ENCOUNTER — Encounter: Payer: Self-pay | Admitting: Radiology

## 2022-05-17 NOTE — Progress Notes (Signed)
Cardiology Office Note  Date: 05/18/2022   ID: Diane Cohen, DOB December 19, 1945, MRN [id number]  History of Present Illness: Diane Cohen is a 77 y.o. female last seen in August 2023 by Ms. Lorna Few, I reviewed the note.  She is here for a routine visit.  Reports intermittent sense of palpitations, also occasional feelings of shortness of breath and weakness.  She has noticed that she sometimes finds her heart rate to be as low as the "30s" by pulse oximeter during these episodes.  She has had no frank syncope.  No exertional chest pain.  No leg swelling or increasing weights.  I reviewed her medications today.  She has been taking a quarter of a Toprol-XL 100 mg tablet since last visit.  We discussed changing this dosing to a single Toprol-XL 25 mg tablet.  Otherwise using Demadex with potassium supplement on a daily basis, doubling this perhaps once in 2 weeks.  She has not used any short acting Cardizem for palpitations.  Also remains on a stable dose of amiodarone.  I did review her recent lab work, TSH mildly elevated at 9.68.  Physical Exam: VS:  BP 126/68   Pulse 72   Ht 5\' 1"  (1.549 m)   Wt 158 lb 12.8 oz (72 kg)   SpO2 95%   BMI 30.00 kg/m , BMI Body mass index is 30 kg/m.  Wt Readings from Last 3 Encounters:  05/18/22 158 lb 12.8 oz (72 kg)  11/17/21 173 lb (78.5 kg)  11/03/21 167 lb 12.8 oz (76.1 kg)    General: Patient appears comfortable at rest. HEENT: Conjunctiva and lids normal. Neck: Supple, no elevated JVP or carotid bruits. Lungs: Clear to auscultation, nonlabored breathing at rest. Cardiac: Regular rate and rhythm, no S3, 2/6 systolic murmur. Extremities: No pitting edema.  ECG:  An ECG dated 11/03/2021 was personally reviewed today and demonstrated:  Sinus rhythm with borderline prolonged PR interval, nonspecific ST-T changes.  Labwork:  February 2024: TSH 9.68, hemoglobin 9.7, platelets 285, BUN 15, creatinine 0.97, potassium 4.4, AST 29, ALT 17,  cholesterol 161, triglycerides 91, HDL 65, LDL 79, hemoglobin A1c 5.6%  Other Studies Reviewed Today:  No interval cardiac testing for review.  Assessment and Plan:  1.  Persistent atrial fibrillation with CHA2DS2-VASc score of 6.  She is status post atrial fibrillation ablation in 2019 with subsequent recurrences requiring cardioversions, on treatment with amiodarone and AV nodal blockers. Also Xarelto for stroke prophylaxis.  She does not report any spontaneous bleeding problems with intermittent palpitations as before on Toprol-XL 25 mg daily.  She has been reporting intermittent spells of shortness of breath and weakness with apparent bradycardia by pulse oximetry.  Would be concerned about conduction system disease and intermittent symptomatic bradycardia or pauses.  Plan to obtain a 7-day ZIO monitor.  2. HFpEF, LVEF 55-60% by echocardiogram in September 2021.  Left atrium moderately dilated at that time with mild mitral regurgitation and moderately increased estimated RVSP.  Her weight is actually down compared to last visit, she does not report any leg swelling, no orthopnea or PND.  Intermittent shortness of breath as discussed above.  Plan to update echocardiogram and determine potential changes in GDMT from there.  3.  Coronary calcium score of 63 with mild nonobstructive coronary atherosclerosis by coronary CTA in October 2022.  She does not report any angina he is on Lipitor.  Last LDL 79.  Will continue to discuss up titration of therapy.  Disposition:  Follow up  test results and determine follow-up plan.  Signed, Jonelle Sidle, M.D., F.A.C.C.

## 2022-05-18 ENCOUNTER — Ambulatory Visit: Payer: Medicare HMO | Attending: Cardiology

## 2022-05-18 ENCOUNTER — Encounter: Payer: Self-pay | Admitting: Cardiology

## 2022-05-18 ENCOUNTER — Ambulatory Visit: Payer: Medicare HMO | Attending: Cardiology | Admitting: Cardiology

## 2022-05-18 VITALS — BP 126/68 | HR 72 | Ht 61.0 in | Wt 158.8 lb

## 2022-05-18 DIAGNOSIS — R42 Dizziness and giddiness: Secondary | ICD-10-CM

## 2022-05-18 DIAGNOSIS — R001 Bradycardia, unspecified: Secondary | ICD-10-CM

## 2022-05-18 DIAGNOSIS — I4819 Other persistent atrial fibrillation: Secondary | ICD-10-CM

## 2022-05-18 DIAGNOSIS — I5030 Unspecified diastolic (congestive) heart failure: Secondary | ICD-10-CM | POA: Diagnosis not present

## 2022-05-18 MED ORDER — METOPROLOL SUCCINATE ER 50 MG PO TB24: 50.0000 mg | ORAL_TABLET | Freq: Every day | ORAL | 3 refills | Status: DC

## 2022-05-18 NOTE — Patient Instructions (Signed)
Medication Instructions:  Your physician recommends that you continue on your current medications as directed. Please refer to the Current Medication list given to you today.   Labwork: None  Testing/Procedures: Your physician has requested that you have an echocardiogram. Echocardiography is a painless test that uses sound waves to create images of your heart. It provides your doctor with information about the size and shape of your heart and how well your heart's chambers and valves are working. This procedure takes approximately one hour. There are no restrictions for this procedure. Please do NOT wear cologne, perfume, aftershave, or lotions (deodorant is allowed). Please arrive 15 minutes prior to your appointment time.   Follow-Up: Follow up is pending testing results.   Any Other Special Instructions Will Be Listed Below (If Applicable).   ZIO XT- Long Term Monitor Instructions   Your physician has requested you wear your ZIO patch monitor___7___days.   This is a single patch monitor.  [employer] supplies one patch monitor per enrollment.  Additional stickers are not available.   Please do not apply patch if you will be having a Nuclear Stress Test, Echocardiogram, Cardiac CT, MRI, or Chest Xray during the time frame you would be wearing the monitor. The patch cannot be worn during these tests.  You cannot remove and re-apply the ZIO XT patch monitor.   Your ZIO patch monitor will be sent USPS Priority mail from [facility] directly to your home address. The monitor may also be mailed to a PO BOX if home delivery is not available.   It may take 3-5 days to receive your monitor after you have been enrolled.   Once you have received you monitor, please review enclosed instructions.  Your monitor has already been registered assigning a specific monitor serial # to you.   Applying the monitor   Shave hair from upper left chest.   Hold abrader disc by orange tab.  Rub  abrader in 40 strokes over left upper chest as indicated in your monitor instructions.   Clean area with 4 enclosed alcohol pads .  Use all pads to assure are is cleaned thoroughly.  Let dry.   Apply patch as indicated in monitor instructions.  Patch will be place under collarbone on left side of chest with arrow pointing upward.   Rub patch adhesive wings for 2 minutes.Remove white label marked "1".  Remove white label marked "2".  Rub patch adhesive wings for 2 additional minutes.   While looking in a mirror, press and release button in center of patch.  A small green light will flash 3-4 times .  This will be your only indicator the monitor has been turned on.     Do not shower for the first 24 hours.  You may shower after the first 24 hours.   Press button if you feel a symptom. You will hear a small click.  Record Date, Time and Symptom in the Patient Log Book.   When you are ready to remove patch, follow instructions on last 2 pages of Patient Log Book.  Stick patch monitor onto last page of Patient Log Book.   Place Patient Log Book in [place] box.  Use locking tab on box and tape box closed securely.  The [employer] and [employer] has [employer] on it.  Please place in mailbox as soon as possible.  Your physician should have your test results approximately 7 days after the monitor has been mailed back to [facility].   Call  [place] at [phone] if you have questions regarding your ZIO XT patch monitor.  Call them immediately if you see an orange light blinking on your monitor.   If your monitor falls off in less than 4 days contact our Monitor department at [phone].  If your monitor becomes loose or falls off after 4 days call [employer] at [phone] for suggestions on securing your monitor.      If you need a refill on your cardiac medications before your next appointment, please call your pharmacy.

## 2022-05-19 ENCOUNTER — Ambulatory Visit (HOSPITAL_COMMUNITY)
Admission: RE | Admit: 2022-05-19 | Discharge: 2022-05-19 | Disposition: A | Discharge status: Home / Self Care | Payer: Medicare HMO | Source: Ambulatory Visit | Attending: Cardiology | Admitting: Cardiology

## 2022-05-19 DIAGNOSIS — I5030 Unspecified diastolic (congestive) heart failure: Secondary | ICD-10-CM | POA: Diagnosis not present

## 2022-05-19 LAB — ECHOCARDIOGRAM COMPLETE
Area-P 1/2: 3.99 cm2
S' Lateral: 2.3 cm

## 2022-05-19 NOTE — Progress Notes (Signed)
*  PRELIMINARY RESULTS* Echocardiogram 2D Echocardiogram has been performed.  Diane Cohen 05/19/2022, 2:48 PM

## 2022-05-21 ENCOUNTER — Telehealth: Payer: Self-pay | Admitting: Cardiology

## 2022-05-21 MED ORDER — DAPAGLIFLOZIN PROPANEDIOL 10 MG PO TABS: 10.0000 mg | ORAL_TABLET | Freq: Every day | ORAL | 0 refills | Status: DC

## 2022-05-21 NOTE — Addendum Note (Signed)
Addended by: Barbarann Ehlers A on: 05/21/2022 04:29 PM   Modules accepted: Orders

## 2022-05-21 NOTE — Telephone Encounter (Signed)
Pt was returning nurses call from this afternoon and would like a callback regarding her results. Please advise.

## 2022-05-21 NOTE — Telephone Encounter (Signed)
Satira Sark, MD   Results reviewed.  Follow-up echocardiogram shows LVEF 60 to 65% with restrictive diastolic filling pattern and severely elevated pulmonary systolic pressures.  Only mild mitral rotation.  She has known HFpEF and is on Demadex with potassium supplement.  Suggest starting Farxiga 10 mg daily at this point.  Following up on cardiac monitor.  Go ahead make a 4 to 6-week follow-up visit.       Results discussed with patient, and she agrees to start farxiga 10 mg daily  Samples given #28 , Lot #[id number] EXP:01/06/2025   She has f/u apt next month

## 2022-05-29 DIAGNOSIS — I10 Essential (primary) hypertension: Secondary | ICD-10-CM | POA: Diagnosis not present

## 2022-05-29 DIAGNOSIS — I480 Paroxysmal atrial fibrillation: Secondary | ICD-10-CM | POA: Diagnosis not present

## 2022-05-29 DIAGNOSIS — I110 Hypertensive heart disease with heart failure: Secondary | ICD-10-CM | POA: Diagnosis not present

## 2022-05-29 DIAGNOSIS — I5032 Chronic diastolic (congestive) heart failure: Secondary | ICD-10-CM | POA: Diagnosis not present

## 2022-05-29 DIAGNOSIS — R001 Bradycardia, unspecified: Secondary | ICD-10-CM | POA: Diagnosis not present

## 2022-05-29 DIAGNOSIS — E782 Mixed hyperlipidemia: Secondary | ICD-10-CM | POA: Diagnosis not present

## 2022-05-29 DIAGNOSIS — E039 Hypothyroidism, unspecified: Secondary | ICD-10-CM | POA: Diagnosis not present

## 2022-05-29 DIAGNOSIS — I700 Atherosclerosis of aorta: Secondary | ICD-10-CM | POA: Diagnosis not present

## 2022-05-29 DIAGNOSIS — F418 Other specified anxiety disorders: Secondary | ICD-10-CM | POA: Diagnosis not present

## 2022-06-03 DIAGNOSIS — I4819 Other persistent atrial fibrillation: Secondary | ICD-10-CM | POA: Diagnosis not present

## 2022-06-03 DIAGNOSIS — R001 Bradycardia, unspecified: Secondary | ICD-10-CM | POA: Diagnosis not present

## 2022-06-22 ENCOUNTER — Other Ambulatory Visit: Payer: Self-pay | Admitting: Student

## 2022-06-23 MED ORDER — RIVAROXABAN 20 MG PO TABS: 20.0000 mg | ORAL_TABLET | Freq: Every day | ORAL | 5 refills | Status: DC

## 2022-06-23 NOTE — Telephone Encounter (Signed)
Called spoke with pt, advised of dosage change per Dr Diona Browner.  Will send in new rx to pharmacy.

## 2022-06-23 NOTE — Telephone Encounter (Signed)
Pt last saw Dr Diona Browner 05/18/22, last labs 04/10/22 Creat 0.97, age 77, weight 72kg, CrCl 56.08, based on CrCl pt is not on appropriate dosage of Xarelto for afib. Pt is currently on Xarelto  QD.  Please advise if dosage change to Xarelto  QD is appropriate.

## 2022-06-23 NOTE — Telephone Encounter (Deleted)
Pt last saw Dr Diona Browner 05/18/22, last labs 04/10/22 Creat 0.97, age 77, weight 72kg, CrCl 56.08, based on CrCl pt is on appropriate dosage of Xarelto  QD for afib.  Will refill rx.

## 2022-06-26 ENCOUNTER — Ambulatory Visit: Payer: Medicare HMO | Attending: Student | Admitting: Student

## 2022-06-26 ENCOUNTER — Encounter: Payer: Self-pay | Admitting: Student

## 2022-06-26 VITALS — BP 148/62 | HR 72 | Ht 61.0 in | Wt 155.0 lb

## 2022-06-26 DIAGNOSIS — I5030 Unspecified diastolic (congestive) heart failure: Secondary | ICD-10-CM

## 2022-06-26 DIAGNOSIS — E785 Hyperlipidemia, unspecified: Secondary | ICD-10-CM

## 2022-06-26 DIAGNOSIS — R001 Bradycardia, unspecified: Secondary | ICD-10-CM | POA: Diagnosis not present

## 2022-06-26 DIAGNOSIS — I2510 Atherosclerotic heart disease of native coronary artery without angina pectoris: Secondary | ICD-10-CM

## 2022-06-26 DIAGNOSIS — G4730 Sleep apnea, unspecified: Secondary | ICD-10-CM

## 2022-06-26 DIAGNOSIS — F418 Other specified anxiety disorders: Secondary | ICD-10-CM | POA: Diagnosis not present

## 2022-06-26 DIAGNOSIS — I480 Paroxysmal atrial fibrillation: Secondary | ICD-10-CM

## 2022-06-26 DIAGNOSIS — E039 Hypothyroidism, unspecified: Secondary | ICD-10-CM | POA: Diagnosis not present

## 2022-06-26 DIAGNOSIS — I10 Essential (primary) hypertension: Secondary | ICD-10-CM

## 2022-06-26 DIAGNOSIS — Z7182 Exercise counseling: Secondary | ICD-10-CM | POA: Diagnosis not present

## 2022-06-26 DIAGNOSIS — Z713 Dietary counseling and surveillance: Secondary | ICD-10-CM | POA: Diagnosis not present

## 2022-06-26 DIAGNOSIS — E782 Mixed hyperlipidemia: Secondary | ICD-10-CM | POA: Diagnosis not present

## 2022-06-26 DIAGNOSIS — D649 Anemia, unspecified: Secondary | ICD-10-CM | POA: Diagnosis not present

## 2022-06-26 NOTE — Progress Notes (Signed)
Cardiology Office Note    Date:  06/26/2022  ID:  [name], DOB July 02, 1945, MRN [id number] Cardiologist: Nona Dell, MD    History of Present Illness:    Diane Cohen is a 77 y.o. female with past medical history of paroxysmal atrial fibrillation (s/p ablation in 2019, DCCV in 04/2018, repeat DCCV in 05/2020 after being started on Amiodarone), CAD (s/p Coronary CT in 12/2020 showing 25-49% stenosis along LAD but no obstructive disease), HFpEF, HTN, HLD, Hypothyroidism who presents to the office today for 6-week follow-up.  She was examined by Dr. Diona Browner in 05/2022 and reported intermittent palpitations with occasional episodes of dyspnea and weakness and her heart rate had been variable when checked at home. A 7-day event monitor was recommended for further evaluation along with an echocardiogram. Her echocardiogram showed a preserved EF of 60 to 65% with no wall motion normalities. She did have grade 3 diastolic dysfunction, normal RV function, severely elevated PASP of 60.7 mmHg and mild MR. Dr. Diona Browner did recommend starting Farxiga 10 mg daily.  Her cardiac monitor showed predominantly normal sinus rhythm with an average heart rate of 74 bpm.  She did have rare PACs and PVCs representing less than 1% of total beats and several episodes of PSVT with the longest being 11 beats.  There were no sustained arrhythmias, pauses or recurrent atrial fibrillation.  In talking with the patient today, she reports still having occasional episodes of dyspnea and weakness. This can occur at rest or with activity. She has been checking her heart rate at home and says it has overall been well-controlled. She was previously having occasional episodes of bradycardia with heart rate in the 30's but no recent reports of this. Says that her episodes of PND have improved since being on Farxiga. No recent abdominal distention or lower extremity edema.  Studies Reviewed:   EKG: EKG is not ordered today. EKG  from 11/03/2021 is personally reviewed and demonstrates sinus tachycardia, HR 103 with no acute ST changes. P-waves evident in V1 and V2.   Coronary CT: 12/2020 oronary Arteries:  Normal coronary origin.  Right dominance.   Left main: The left main is a large caliber vessel with a normal take off from the left coronary cusp that bifurcates to form a left anterior descending artery and a left circumflex artery. There is no plaque or stenosis.   Left anterior descending artery: The LAD is patent and gives off 2 patent diagonal branches. There is calcified plaque in the ostial and mid LAD with associated stenosis of 25-49%.   Ramus intermedius: Patent with no evidence of plaque or stenosis.   Left circumflex artery: The LCX is non-dominant and patent with no evidence of plaque or stenosis. The LCX gives off 2 patent obtuse marginal branches.   Right coronary artery: The RCA is dominant with normal take off from the right coronary cusp. There is no evidence of plaque or stenosis. The RCA terminates as a PDA and right posterolateral branch without evidence of plaque or stenosis.   Right Atrium: Right atrial size is within normal limits.   Right Ventricle: The right ventricular cavity is within normal limits.   Left Atrium: Left atrial size appears enlarged with no left atrial appendage filling defect.   Left Ventricle: The ventricular cavity size is within normal limits. There are no stigmata of prior infarction. There is no abnormal filling defect.   Pulmonary arteries: Normal in size without proximal filling defect.   Pulmonary veins: Normal  pulmonary venous drainage.   Pericardium: Normal thickness with no significant effusion or calcium present.   Cardiac valves: The aortic valve is trileaflet without significant calcification. The mitral valve is normal structure without significant calcification.   Aorta: Normal caliber with no significant disease.   Extra-cardiac  findings: See attached radiology report for non-cardiac structures.   IMPRESSION: 1. Coronary calcium score of 62.9. This was 53rd percentile for age-, sex, and race-matched controls.   2.  Normal coronary origin with right dominance.   3.  Mild atherosclerosis.  CAD RADS 2.   4.  The study has been submitted for FFR analysis.  IMPRESSION:   1.  CT FFR analysis didn't show any significant stenosis.   Echocardiogram: 05/2022 IMPRESSIONS     1. Left ventricular ejection fraction, by estimation, is 60 to 65%. The  left ventricle has normal function. The left ventricle has no regional  wall motion abnormalities. There is mild left ventricular hypertrophy.  Left ventricular diastolic parameters  are consistent with Grade III diastolic dysfunction (restrictive).  Elevated left atrial pressure. The E/e' is 22.   2. Right ventricular systolic function is normal. The right ventricular  size is normal. There is severely elevated pulmonary artery systolic  pressure. The estimated right ventricular systolic pressure is 60.7 mmHg.   3. Left atrial size was severely dilated.   4. Right atrial size was moderately dilated.   5. The mitral valve is normal in structure. Mild mitral valve  regurgitation. No evidence of mitral stenosis.   6. The aortic valve is tricuspid. Aortic valve regurgitation is not  visualized. No aortic stenosis is present.   7. The inferior vena cava is dilated in size with <50% respiratory  variability, suggesting right atrial pressure of 15 mmHg.   Comparison(s): No significant change from prior study.   Event Monitor: 05/2022 ZIO monitor reviewed.  8 days, 1 hour analyzed.   Predominant rhythm is sinus with heart rate ranging from 59 bpm up to 128 bpm and average heart rate 74 bpm. There were rare PACs including atrial couplets and triplets representing less than 1% total beats. There were rare PVCs including ventricular couplets representing less than 1% total  beats. Several episodes of PSVT were noted, all brief with the longest episode lasting 11 beats. There were no sustained arrhythmias or pauses.    Physical Exam:   VS:  BP (!) 148/62   Pulse 72   Ht 5\' 1"  (1.549 m)   Wt 155 lb (70.3 kg)   SpO2 99%   BMI 29.29 kg/m    Wt Readings from Last 3 Encounters:  06/26/22 155 lb (70.3 kg)  05/18/22 158 lb 12.8 oz (72 kg)  11/17/21 173 lb (78.5 kg)     GEN: Pleasant female appearing in no acute distress NECK: No JVD; No carotid bruits CARDIAC: RRR, no murmurs, rubs, gallops RESPIRATORY:  Clear to auscultation without rales, wheezing or rhonchi.  ABDOMEN: Appears non-distended. No obvious abdominal masses. EXTREMITIES: No clubbing or cyanosis. No pitting edema.  Distal pedal pulses are 2+ bilaterally.   Assessment and Plan:   1. Paroxysmal Atrial Fibrillation/Pulmonary HTN - She is s/p ablation in 2019 and experienced recurrent atrial fibrillation with most recent DCCV in 05/2020. Her recent monitor showed brief SVT as outlined above but no recurrent atrial fibrillation.  - Reviewed monitor results with the patient today and at this time we will continue with her current regimen with Amiodarone 200 mg daily, Cardizem CD 180 mg daily and  Toprol-XL 25 mg daily.  Her TSH was elevated when checked by her PCP in 04/2022 and she did have repeat labs today. If this continues to be an issue, may need to reduce Amiodarone to 100 mg daily.  Given her atrial fibrillation, snoring, daytime somnolence and severely elevated PASP of 60.7 mmHg by recent echocardiogram, will refer to Pulmonology for a sleep study (last one was 10+ years ago). - No reports of active bleeding. Continue Xarelto 20 mg daily for anticoagulation (CrCl at 55 mL/min based off most recent labs).   2. CAD - Prior Coronary CT in 12/2020 showed 25-49% stenosis along the LAD but no obstructive disease. She has intermittent dyspnea but denies any exertional chest pain. Continue with risk  factor modification. She remains on Atorvastatin 20 mg daily and Toprol-XL 25 mg daily. Not on ASA given the need for anticoagulation.  3. HFpEF - Her weight has been stable on her home scales and she appears euvolemic by examination today. Continue current medical therapy with Farxiga 10 mg daily and Torsemide 20 mg daily.  She did have recent labs with her PCP earlier today and we will request a copy of these.  4. HTN - Her blood pressure is elevated at 148/62 during today's visit and was elevated at 144/72 when checked by her PCP earlier today. She has not been checking her blood pressure at home and was provided with a BP log to return in a few weeks. At this time, we will continue her current medication regimen with Cardizem CD 180 mg daily, Lisinopril 10 mg daily and Toprol-XL 25 mg daily. If BP remains elevated, with plan to titrate Lisinopril from 10 mg to 20 mg daily with a follow-up BMET in 2 weeks.   5. HLD - Her LDL was at 79 by most recent labs in 04/2022. Remains on Atorvastatin 20 mg daily.   Signed, Ellsworth Lennox, PA-C

## 2022-06-26 NOTE — Patient Instructions (Addendum)
Medication Instructions:  Your physician recommends that you continue on your current medications as directed. Please refer to the Current Medication list given to you today.   Labwork: None today  Testing/Procedures: None today  Follow-Up: 3-4 months  Any Other Special Instructions Will Be Listed Below (If Applicable).   You have been referred to pulmonary, they will call you to schedule appointment.  Please keep daily BP log and return in a few weeks   If you need a refill on your cardiac medications before your next appointment, please call your pharmacy.

## 2022-06-29 ENCOUNTER — Encounter: Payer: Self-pay | Admitting: Internal Medicine

## 2022-07-02 DIAGNOSIS — E0590 Thyrotoxicosis, unspecified without thyrotoxic crisis or storm: Secondary | ICD-10-CM | POA: Diagnosis present

## 2022-07-08 ENCOUNTER — Telehealth: Payer: Self-pay | Admitting: Cardiology

## 2022-07-08 MED ORDER — DAPAGLIFLOZIN PROPANEDIOL 10 MG PO TABS: 10.0000 mg | ORAL_TABLET | Freq: Every day | ORAL | 3 refills | Status: DC

## 2022-07-08 NOTE — Telephone Encounter (Signed)
Refill complete 

## 2022-07-08 NOTE — Telephone Encounter (Signed)
*  STAT* If patient is at the pharmacy, call can be transferred to refill team.   1. Which medications need to be refilled? (please list name of each medication and dose if known) dapagliflozin propanediol (FARXIGA) 10 MG TABS tablet   2. Which pharmacy/location (including street and city if local pharmacy) is medication to be sent to? [id number] - , [place] - [address]   3. Do they need a 30 day or 90 day supply? 90 day if possible

## 2022-08-19 ENCOUNTER — Encounter: Payer: Self-pay | Admitting: Primary Care

## 2022-08-19 ENCOUNTER — Ambulatory Visit (INDEPENDENT_AMBULATORY_CARE_PROVIDER_SITE_OTHER): Payer: Medicare HMO | Admitting: Primary Care

## 2022-08-19 VITALS — BP 134/69 | HR 78 | Ht 61.0 in | Wt 157.4 lb

## 2022-08-19 DIAGNOSIS — I4891 Unspecified atrial fibrillation: Secondary | ICD-10-CM | POA: Diagnosis not present

## 2022-08-19 DIAGNOSIS — I480 Paroxysmal atrial fibrillation: Secondary | ICD-10-CM

## 2022-08-19 DIAGNOSIS — R0683 Snoring: Secondary | ICD-10-CM | POA: Insufficient documentation

## 2022-08-19 NOTE — Progress Notes (Signed)
Reviewed and agree with assessment/plan.   Coralyn Helling, MD [facility] Pulmonary/Critical Care 08/19/2022, 2:04 PM Pager:  [phone]

## 2022-08-19 NOTE — Assessment & Plan Note (Addendum)
-   Patient has a history of paroxysmal A-fib.  She has symptoms of loud snoring and mild daytime sleepiness.  Last sleep study was done in [place] more than 10 years ago, results are not available.  She is not currently on CPAP. Epworth 16. Needs in-lab sleep study due to A-fib due to concerns for underlying obstructive sleep apnea.  We reviewed risks of untreated sleep apnea and chronic including cardiac arrhythmias, pulmonary hypertension, diabetes and stroke.  We also discussed treatment options including weight loss, positional sleep, oral appliance, CPAP therapy referral to ENT for possible surgical options.  She expressed some interest in inspire, we briefly reviewed qualifications for hypoglossal nerve stimulator and benefits of procedure.  Encourage side sleeping position advised against driving if experiencing excessive daytime sleepiness fatigue.  Follow-up 2 weeks after completing sleep study to review results and treatment options if needed.

## 2022-08-19 NOTE — Patient Instructions (Signed)
Focus on side sleeping position Avoid driving if tired  Orders: In lab sleep study  Follow-up: Call 1-2 weeks after completing sleep study to set up virtual visit or in office visit to review results/treatment options   Sleep Apnea Sleep apnea affects breathing during sleep. It causes breathing to stop for 10 seconds or more, or to become shallow. People with sleep apnea usually snore loudly. It can also increase the risk of: Heart attack. Stroke. Being very overweight (obese). Diabetes. Heart failure. Irregular heartbeat. High blood pressure. The goal of treatment is to help you breathe normally again. What are the causes?  The most common cause of this condition is a collapsed or blocked airway. There are three kinds of sleep apnea: Obstructive sleep apnea. This is caused by a blocked or collapsed airway. Central sleep apnea. This happens when the brain does not send the right signals to the muscles that control breathing. Mixed sleep apnea. This is a combination of obstructive and central sleep apnea. What increases the risk? Being overweight. Smoking. Having a small airway. Being older. Being female. Drinking alcohol. Taking medicines to calm yourself (sedatives or tranquilizers). Having family members with the condition. Having a tongue or tonsils that are larger than normal. What are the signs or symptoms? Trouble staying asleep. Loud snoring. Headaches in the morning. Waking up gasping. Dry mouth or sore throat in the morning. Being sleepy or tired during the day. If you are sleepy or tired during the day, you may also: Not be able to focus your mind (concentrate). Forget things. Get angry a lot and have mood swings. Feel sad (depressed). Have changes in your personality. Have less interest in sex, if you are female. Be unable to have an erection, if you are female. How is this treated?  Sleeping on your side. Using a medicine to get rid of mucus in your nose  (decongestant). Avoiding the use of alcohol, medicines to help you relax, or certain pain medicines (narcotics). Losing weight, if needed. Changing your diet. Quitting smoking. Using a machine to open your airway while you sleep, such as: An oral appliance. This is a mouthpiece that shifts your lower jaw forward. A CPAP device. This device blows air through a mask when you breathe out (exhale). An EPAP device. This has valves that you put in each nostril. A BIPAP device. This device blows air through a mask when you breathe in (inhale) and breathe out. Having surgery if other treatments do not work. Follow these instructions at home: Lifestyle Make changes that your doctor recommends. Eat a healthy diet. Lose weight if needed. Avoid alcohol, medicines to help you relax, and some pain medicines. Do not smoke or use any products that contain nicotine or tobacco. If you need help quitting, ask your doctor. General instructions Take over-the-counter and prescription medicines only as told by your doctor. If you were given a machine to use while you sleep, use it only as told by your doctor. If you are having surgery, make sure to tell your doctor you have sleep apnea. You may need to bring your device with you. Keep all follow-up visits. Contact a doctor if: The machine that you were given to use during sleep bothers you or does not seem to be working. You do not get better. You get worse. Get help right away if: Your chest hurts. You have trouble breathing in enough air. You have an uncomfortable feeling in your back, arms, or stomach. You have trouble talking. One side  of your body feels weak. A part of your face is hanging down. These symptoms may be an emergency. Get help right away. Call your local emergency services (911 in the [place] Do not wait to see if the symptoms will go away. Do not drive yourself to the hospital. Summary This condition affects breathing during  sleep. The most common cause is a collapsed or blocked airway. The goal of treatment is to help you breathe normally while you sleep. This information is not intended to replace advice given to you by your health care provider. Make sure you discuss any questions you have with your health care provider. Document Revised: 10/02/2020 Document Reviewed: 02/02/2020 Elsevier Patient Education  2024 [employer].

## 2022-08-19 NOTE — Assessment & Plan Note (Signed)
-   Appears in normal rhythm today on exam.  Rate 78. - Continue Cardizem, Toprol XL and Xarelto as prescribed

## 2022-08-19 NOTE — Progress Notes (Signed)
@Patient  ID: Diane Cohen, female    DOB: 15-Oct-1945, 77 y.o.   MRN: [id number]  Chief Complaint  Patient presents with   Consult    Referring provider: Ellsworth Lennox, PA*  HPI: 77 year old female, former smoker. PMH significant for afib, CHF, HTN, hypothyroidism, anemia.  08/19/2022 Patient presents today for sleep consult. Patient was referred today by primary care due to A-fib.  Patient has symptoms of snoring.  Mild associated daytime fatigue.  She reports sleeping well at night. Patient reports most recent episode of A-fib was last week. Typical bedtime is between 1 AM and 4 AM.  It does not take her long to fall asleep.  She does not wake up after falling asleep.  She starts her day between 930 and 10 AM.  She had a previous study done in [place] approximately 10 years ago, results are not available.  She is not currently on CPAP and does not wear oxygen.  No respiratory complaints.  No concern for narcolepsy or cataplexy.  Epworth score at 16.   Allergies  Allergen Reactions   Levaquin [Levofloxacin] Other (See Comments)    Headache    Sulfa Antibiotics Hives    Immunization History  Administered Date(s) Administered   Influenza, High Dose Seasonal PF 01/15/2018   Influenza,inj,Quad PF,6+ Mos 12/29/2013   Influenza-Unspecified 02/07/2015    Past Medical History:  Diagnosis Date   Anxiety    Atrial fibrillation (HCC)    Atrial fibrillation ablation in 2019 - Dr. Johney Frame   Chronic diastolic heart failure (HCC)    Depression    Essential hypertension    History of TIA (transient ischemic attack)    Hyperlipidemia    Hypothyroidism    Pneumonia     Tobacco History: Social History   Tobacco Use  Smoking Status Former   Packs/day: .5   Types: Cigarettes   Quit date: 01/22/1973   Years since quitting: 49.6  Smokeless Tobacco Never   Counseling given: Not Answered   Outpatient Medications Prior to Visit  Medication Sig Dispense Refill    acetaminophen (TYLENOL) 650 MG CR tablet Take 1,300 mg by mouth every 8 (eight) hours as needed for pain. Arthritis Strength     amiodarone (PACERONE) 200 MG tablet TAKE 1 TABLET(200 MG) BY MOUTH DAILY 90 tablet 3   atorvastatin (LIPITOR) 20 MG tablet Take 20 mg by mouth daily.     dapagliflozin propanediol (FARXIGA) 10 MG TABS tablet Take 1 tablet (10 mg total) by mouth daily before breakfast. 90 tablet 3   diltiazem (CARDIZEM CD) 180 MG 24 hr capsule TAKE 1 CAPSULE(180 MG) BY MOUTH DAILY 90 capsule 3   diltiazem (CARDIZEM) 30 MG tablet Take 1 tablet (30 mg total) by mouth 4 (four) times daily as needed (for atrial fibrillation). 60 tablet 3   DULoxetine (CYMBALTA) 60 MG capsule Take 1 capsule (60 mg total) by mouth daily. (Patient taking differently: Take by mouth daily.) 30 capsule 1   levothyroxine (SYNTHROID) 137 MCG tablet Take 137 mcg by mouth daily.     lisinopril (ZESTRIL) 10 MG tablet TAKE 1 TABLET(10 MG) BY MOUTH DAILY 90 tablet 3   magnesium oxide (MAG-OX) 400 MG tablet Take 1 tablet (400 mg total) by mouth daily. 20 tablet 0   metoprolol succinate (TOPROL-XL) 50 MG 24 hr tablet Take 25 mg by mouth daily. Take with or immediately following a meal.     potassium chloride SA (KLOR-CON M) 20 MEQ tablet Take 1 tablet (20  mEq total) by mouth daily. 90 tablet 3   rivaroxaban (XARELTO) 20 MG TABS tablet Take 1 tablet (20 mg total) by mouth daily with supper. 30 tablet 5   torsemide (DEMADEX) 20 MG tablet Take 1 tablet (20 mg total) by mouth daily. 90 tablet 3   No facility-administered medications prior to visit.      Review of Systems  Review of Systems  Constitutional:  Negative for fatigue.  HENT: Negative.    Respiratory: Negative.    Cardiovascular:  Positive for palpitations.   Physical Exam  BP 134/69   Pulse 78   Ht 5\' 1"  (1.549 m)   Wt 157 lb 6.4 oz (71.4 kg)   SpO2 96%   BMI 29.74 kg/m  Physical Exam Constitutional:      Appearance: Normal appearance.  HENT:      Head: Normocephalic and atraumatic.     Mouth/Throat:     Mouth: Mucous membranes are moist.     Pharynx: Oropharynx is clear.  Cardiovascular:     Rate and Rhythm: Normal rate and regular rhythm.     Comments: RRR, hx PAF Pulmonary:     Effort: Pulmonary effort is normal.     Breath sounds: Normal breath sounds.  Musculoskeletal:        General: Normal range of motion.  Skin:    General: Skin is warm and dry.  Neurological:     General: No focal deficit present.     Mental Status: She is alert and oriented to person, place, and time. Mental status is at baseline.  Psychiatric:        Mood and Affect: Mood normal.        Behavior: Behavior normal.        Thought Content: Thought content normal.        Judgment: Judgment normal.      Lab Results:  CBC    Component Value Date/Time   WBC 5.2 09/06/2020 1333   RBC 3.78 (L) 09/06/2020 1333   HGB 12.1 09/06/2020 1333   HGB 12.4 01/20/2017 1538   HCT 37.5 09/06/2020 1333   HCT 39.5 01/20/2017 1538   PLT 246 09/06/2020 1333   PLT 299 01/20/2017 1538   MCV 99.2 09/06/2020 1333   MCV 91 01/20/2017 1538   MCH 32.0 09/06/2020 1333   MCHC 32.3 09/06/2020 1333   RDW 14.6 09/06/2020 1333   RDW 19.2 (H) 01/20/2017 1538   LYMPHSABS 1.3 05/21/2020 1458   LYMPHSABS 1.2 01/20/2017 1538   MONOABS 0.4 05/21/2020 1458   EOSABS 0.1 05/21/2020 1458   EOSABS 0.1 01/20/2017 1538   BASOSABS 0.0 05/21/2020 1458   BASOSABS 0.0 01/20/2017 1538    BMET    Component Value Date/Time   NA 139 04/18/2021 1551   NA 142 01/20/2017 1538   K 4.3 04/18/2021 1551   CL 104 04/18/2021 1551   CO2 28 04/18/2021 1551   GLUCOSE 80 04/18/2021 1551   BUN 21 04/18/2021 1551   BUN 14 01/20/2017 1538   CREATININE 1.25 (H) 04/18/2021 1551   CREATININE 0.94 (H) 12/22/2018 1313   CALCIUM 9.9 04/18/2021 1551   GFRNONAA 45 (L) 04/18/2021 1551   GFRAA 48 (L) 12/01/2019 1506    BNP    Component Value Date/Time   BNP 337.0 (H) 06/23/2019 1209     ProBNP    Component Value Date/Time   PROBNP 1,859.0 (H) 12/29/2013 0520    Imaging: No results found.   Assessment & Plan:  Loud snoring - Patient has a history of paroxysmal A-fib.  She has symptoms of loud snoring and mild daytime sleepiness.  Last sleep study was done in [place] more than 10 years ago, results are not available.  She is not currently on CPAP. Epworth 16. Needs in-lab sleep study due to A-fib due to concerns for underlying obstructive sleep apnea.  We reviewed risks of untreated sleep apnea and chronic including cardiac arrhythmias, pulmonary hypertension, diabetes and stroke.  We also discussed treatment options including weight loss, positional sleep, oral appliance, CPAP therapy referral to ENT for possible surgical options.  She expressed some interest in inspire, we briefly reviewed qualifications for hypoglossal nerve stimulator and benefits of procedure.  Encourage side sleeping position advised against driving if experiencing excessive daytime sleepiness fatigue.  Follow-up 2 weeks after completing sleep study to review results and treatment options if needed.  Atrial fibrillation with rapid ventricular response (HCC) - Appears in normal rhythm today on exam.  Rate 78. - Continue Cardizem, Toprol XL and Xarelto as prescribed    Glenford Bayley, NP 08/19/2022

## 2022-09-15 ENCOUNTER — Other Ambulatory Visit: Payer: Self-pay | Admitting: Cardiology

## 2022-09-25 ENCOUNTER — Other Ambulatory Visit: Payer: Self-pay | Admitting: Cardiology

## 2022-10-12 ENCOUNTER — Encounter: Payer: Medicare HMO | Admitting: Pulmonary Disease

## 2022-10-24 ENCOUNTER — Other Ambulatory Visit: Payer: Self-pay

## 2022-10-24 ENCOUNTER — Emergency Department (HOSPITAL_COMMUNITY)
Admission: EM | Admit: 2022-10-24 | Discharge: 2022-10-24 | Disposition: A | Discharge status: Home / Self Care | Payer: Medicare HMO | Attending: Emergency Medicine | Admitting: Emergency Medicine

## 2022-10-24 ENCOUNTER — Emergency Department (HOSPITAL_COMMUNITY): Payer: Medicare HMO

## 2022-10-24 ENCOUNTER — Encounter (HOSPITAL_COMMUNITY): Payer: Self-pay | Admitting: Emergency Medicine

## 2022-10-24 DIAGNOSIS — I4819 Other persistent atrial fibrillation: Secondary | ICD-10-CM | POA: Insufficient documentation

## 2022-10-24 DIAGNOSIS — I5032 Chronic diastolic (congestive) heart failure: Secondary | ICD-10-CM | POA: Insufficient documentation

## 2022-10-24 DIAGNOSIS — S0101XA Laceration without foreign body of scalp, initial encounter: Secondary | ICD-10-CM | POA: Insufficient documentation

## 2022-10-24 DIAGNOSIS — Z79899 Other long term (current) drug therapy: Secondary | ICD-10-CM | POA: Insufficient documentation

## 2022-10-24 DIAGNOSIS — S0990XA Unspecified injury of head, initial encounter: Secondary | ICD-10-CM | POA: Diagnosis not present

## 2022-10-24 DIAGNOSIS — W19XXXA Unspecified fall, initial encounter: Secondary | ICD-10-CM

## 2022-10-24 DIAGNOSIS — S0003XA Contusion of scalp, initial encounter: Secondary | ICD-10-CM | POA: Diagnosis not present

## 2022-10-24 DIAGNOSIS — I110 Hypertensive heart disease with heart failure: Secondary | ICD-10-CM | POA: Diagnosis not present

## 2022-10-24 DIAGNOSIS — Z7901 Long term (current) use of anticoagulants: Secondary | ICD-10-CM | POA: Insufficient documentation

## 2022-10-24 DIAGNOSIS — W01198A Fall on same level from slipping, tripping and stumbling with subsequent striking against other object, initial encounter: Secondary | ICD-10-CM | POA: Diagnosis not present

## 2022-10-24 DIAGNOSIS — I6782 Cerebral ischemia: Secondary | ICD-10-CM | POA: Diagnosis not present

## 2022-10-24 DIAGNOSIS — S199XXA Unspecified injury of neck, initial encounter: Secondary | ICD-10-CM | POA: Diagnosis not present

## 2022-10-24 NOTE — ED Provider Notes (Signed)
[place] EMERGENCY DEPARTMENT AT [facility] Provider Note   CSN: [id number] Arrival date & time: 10/24/22  1955     History Chief Complaint  Patient presents with   Diane Cohen    CAM MOSCHETTO is a 77 y.o. female. Patient with past history significant for essential hypertension, persistent atrial fibrillation, chronic diastolic heart failure who presents to the ED following a mechanical fall. States that she tripped over a box on the ground and struck her left head against the ground. She is on Xarelto for a-fib. Bleeding pronounced from laceration to forehead, but controlled prior to arriving in the ED. Denies any headache, vision changes, nausea, vomiting, or severe pain.   Fall       Home Medications Prior to Admission medications   Medication Sig Start Date End Date Taking? Authorizing Provider  acetaminophen (TYLENOL) 650 MG CR tablet Take 1,300 mg by mouth every 8 (eight) hours as needed for pain. Arthritis Strength    [provider]  amiodarone (PACERONE) 200 MG tablet TAKE 1 TABLET(200 MG) BY MOUTH DAILY 09/15/22   Jonelle Sidle, MD  atorvastatin (LIPITOR) 20 MG tablet Take 20 mg by mouth daily. 05/10/22   [provider]  dapagliflozin propanediol (FARXIGA) 10 MG TABS tablet Take 1 tablet (10 mg total) by mouth daily before breakfast. 07/08/22   Jonelle Sidle, MD  diltiazem (CARDIZEM CD) 180 MG 24 hr capsule TAKE 1 CAPSULE(180 MG) BY MOUTH DAILY 09/25/22   Jonelle Sidle, MD  diltiazem (CARDIZEM) 30 MG tablet Take 1 tablet (30 mg total) by mouth 4 (four) times daily as needed (for atrial fibrillation). 10/06/17   Allred, Fayrene Fearing, MD  DULoxetine (CYMBALTA) 60 MG capsule Take 1 capsule (60 mg total) by mouth daily. Patient taking differently: Take by mouth daily. 06/24/19   Vassie Loll, MD  levothyroxine (SYNTHROID) 137 MCG tablet Take 137 mcg by mouth daily. 05/29/22   [provider]  lisinopril (ZESTRIL) 10 MG tablet TAKE 1  TABLET(10 MG) BY MOUTH DAILY 11/18/20   [place] Ouch, [place] M, PA-C  magnesium oxide (MAG-OX) 400 MG tablet Take 1 tablet (400 mg total) by mouth daily. 06/24/19   Vassie Loll, MD  metoprolol succinate (TOPROL-XL) 50 MG 24 hr tablet Take 25 mg by mouth daily. Take with or immediately following a meal.    [provider]  potassium chloride SA (KLOR-CON M) 20 MEQ tablet Take 1 tablet (20 mEq total) by mouth daily. 11/03/21   Jonelle Sidle, MD  rivaroxaban (XARELTO) 20 MG TABS tablet Take 1 tablet (20 mg total) by mouth daily with supper. 06/23/22   Jonelle Sidle, MD  torsemide (DEMADEX) 20 MG tablet Take 1 tablet (20 mg total) by mouth daily. 11/03/21 10/29/22  Furth, Cadence H, PA-C      Allergies    Levaquin [levofloxacin] and Sulfa antibiotics    Review of Systems   Review of Systems  Skin:  Positive for wound.  All other systems reviewed and are negative.   Physical Exam Updated Vital Signs BP (!) 156/61 (BP Location: Right Arm)   Pulse 70   Temp 98.5 F (36.9 C) (Oral)   Resp (!) 24   Ht 5\' 1"  (1.549 m)   Wt 70.3 kg   SpO2 100%   BMI 29.29 kg/m  Physical Exam Vitals and nursing note reviewed.  Constitutional:      General: She is not in acute distress.    Appearance: She is well-developed.  HENT:  Head: Normocephalic and atraumatic.  Eyes:     Conjunctiva/sclera: Conjunctivae normal.  Cardiovascular:     Rate and Rhythm: Normal rate and regular rhythm.     Heart sounds: No murmur heard. Pulmonary:     Effort: Pulmonary effort is normal. No respiratory distress.     Breath sounds: Normal breath sounds.  Abdominal:     Palpations: Abdomen is soft.     Tenderness: There is no abdominal tenderness.  Musculoskeletal:        General: No swelling, tenderness or deformity.     Cervical back: Neck supple.  Skin:    General: Skin is warm and dry.     Capillary Refill: Capillary refill takes less than 2 seconds.     Findings: Lesion present.      Comments: Small 1cm laceration to the medial forehead, not actively bleeding. Some slight bruising to the left forehead developing.  Neurological:     Mental Status: She is alert.  Psychiatric:        Mood and Affect: Mood normal.     ED Results / Procedures / Treatments   Labs (all labs ordered are listed, but only abnormal results are displayed) Labs Reviewed - No data to display  EKG None  Radiology No results found.  Procedures Procedures   Medications Ordered in ED Medications - No data to display  ED Course/ Medical Decision Making/ A&P                               Medical Decision Making Amount and/or Complexity of Data Reviewed Radiology: ordered.   This patient presents to the ED for concern of fall.  Differential diagnosis includes SAH, stroke, laceration, abrasion   Imaging Studies ordered:  I ordered imaging studies including CT head, CT cervical spine I independently visualized and interpreted imaging which showed no acute intracranial normality, no evidence of any cervical spine malalignment or fracture I agree with the radiologist interpretation   Problem List / ED Course:  Patient with past history significant for A-fib, hypertension, chronic diastolic heart failure presents the emergency department following a fall.  She reports that this is a mechanical fall when she tripped over a box on the floor and hit the left frontal aspect of her head.  Small laceration noted on initial assessment which is not actively bleeding.  She is on Xarelto for A-fib but denies any headache, dizziness, lightheadedness, nausea, vomiting.  Low concern for trauma however low threshold as well for imaging given that patient is anticoagulated.  Will evaluate with CT head and CT cervical spine.  No other area of acute or focal pain.  Final Clinical Impression(s) / ED Diagnoses Final diagnoses:  None    Rx / DC Orders ED Discharge Orders     None

## 2022-10-24 NOTE — ED Triage Notes (Signed)
Pt tripped over a box in the floor hitting the front of her head on the floor. PT with small laceration and bruising to forehead. Bleeding controlled. Denies LOC. Pt on Xarelto.

## 2022-10-24 NOTE — Discharge Instructions (Addendum)
You are seen in the emergency department today for a fall.  Thankfully your imaging was unremarkable with no evidence of any insecurity on normality or any fractures seen.  For the small cut you have on your forehead, you may clean the area with warm soapy water once or twice daily.  No glue or stitches were placed in areas of the cut is very minimal in size and depth.  If you have any worsening of your symptoms, please return the emergency department.

## 2022-10-25 ENCOUNTER — Other Ambulatory Visit: Payer: Self-pay | Admitting: Student

## 2022-10-27 DIAGNOSIS — S0101XD Laceration without foreign body of scalp, subsequent encounter: Secondary | ICD-10-CM | POA: Diagnosis not present

## 2022-10-27 DIAGNOSIS — W19XXXD Unspecified fall, subsequent encounter: Secondary | ICD-10-CM | POA: Diagnosis not present

## 2022-10-27 DIAGNOSIS — Z713 Dietary counseling and surveillance: Secondary | ICD-10-CM | POA: Diagnosis not present

## 2022-10-27 DIAGNOSIS — R0683 Snoring: Secondary | ICD-10-CM | POA: Diagnosis not present

## 2022-10-27 DIAGNOSIS — Z6834 Body mass index (BMI) 34.0-34.9, adult: Secondary | ICD-10-CM | POA: Diagnosis not present

## 2022-10-27 DIAGNOSIS — I480 Paroxysmal atrial fibrillation: Secondary | ICD-10-CM | POA: Diagnosis not present

## 2022-10-27 DIAGNOSIS — S0101XA Laceration without foreign body of scalp, initial encounter: Secondary | ICD-10-CM | POA: Insufficient documentation

## 2022-10-27 DIAGNOSIS — Z7182 Exercise counseling: Secondary | ICD-10-CM | POA: Diagnosis not present

## 2022-10-27 DIAGNOSIS — I10 Essential (primary) hypertension: Secondary | ICD-10-CM | POA: Diagnosis not present

## 2022-11-06 ENCOUNTER — Encounter: Payer: Self-pay | Admitting: Cardiology

## 2022-11-06 ENCOUNTER — Ambulatory Visit: Payer: Medicare HMO | Attending: Cardiology | Admitting: Cardiology

## 2022-11-06 VITALS — BP 122/80 | HR 70 | Ht 61.0 in | Wt 158.6 lb

## 2022-11-06 DIAGNOSIS — I5030 Unspecified diastolic (congestive) heart failure: Secondary | ICD-10-CM

## 2022-11-06 DIAGNOSIS — I4819 Other persistent atrial fibrillation: Secondary | ICD-10-CM

## 2022-11-06 NOTE — Patient Instructions (Signed)
Medication Instructions:  Your physician recommends that you continue on your current medications as directed. Please refer to the Current Medication list given to you today.   Labwork: None today  Testing/Procedures: None today  Follow-Up: 6 months  Any Other Special Instructions Will Be Listed Below (If Applicable).  If you need a refill on your cardiac medications before your next appointment, please call your pharmacy.  

## 2022-11-06 NOTE — Progress Notes (Signed)
Cardiology Office Note  Date: 11/06/2022   ID: Diane Cohen, Diane Cohen 1945/08/31, MRN [id number]  History of Present Illness: Diane Cohen is a 77 y.o. female last seen in April by Ms. Strader PA-C, I reviewed the note.  She is here for a routine visit.  Reports no progressive sense of palpitations, still has brief spells of lightheadedness but no syncope.  Workup earlier this year was reassuring in terms of heart monitor and echocardiogram in March.  I reviewed her medications which are stable from a cardiac perspective.  She does not report any progressive fluid retention with relatively stable weights.  Continues on [place] with potassium supplement.  ECG today shows normal sinus rhythm with decreased R wave progression.  She is on amiodarone, Cardizem CD, Toprol-XL, and Xarelto as before.  Continue to follow lab work with PCP.  She does not report any spontaneous bleeding problems.  Physical Exam: VS:  BP 122/80   Pulse 70   Ht 5\' 1"  (1.549 m)   Wt 158 lb 9.6 oz (71.9 kg)   SpO2 98%   BMI 29.97 kg/m , BMI Body mass index is 29.97 kg/m.  Wt Readings from Last 3 Encounters:  11/06/22 158 lb 9.6 oz (71.9 kg)  10/24/22 155 lb (70.3 kg)  08/19/22 157 lb 6.4 oz (71.4 kg)    General: Patient appears comfortable at rest. HEENT: Conjunctiva and lids normal. Neck: Supple, no elevated JVP or carotid bruits. Lungs: Clear to auscultation, nonlabored breathing at rest. Cardiac: Regular rate and rhythm, no S3, 2/6 systolic murmur. Extremities: No pitting edema.  ECG:  An ECG dated 11/03/2021 was personally reviewed today and demonstrated:  Sinus rhythm with borderline prolonged PR interval, nonspecific ST-T changes.  Labwork:  April 2024: TSH 0.149, free T4 2.8, hemoglobin 9.9, platelets 295, BUN 11, creatinine 0.96, potassium 4.3, AST 30, ALT 21  Other Studies Reviewed Today:  Echocardiogram 05/19/2022:  1. Left ventricular ejection fraction, by estimation, is 60 to 65%.  The  left ventricle has normal function. The left ventricle has no regional  wall motion abnormalities. There is mild left ventricular hypertrophy.  Left ventricular diastolic parameters  are consistent with Grade III diastolic dysfunction (restrictive).  Elevated left atrial pressure. The E/e' is 22.   2. Right ventricular systolic function is normal. The right ventricular  size is normal. There is severely elevated pulmonary artery systolic  pressure. The estimated right ventricular systolic pressure is 60.7 mmHg.   3. Left atrial size was severely dilated.   4. Right atrial size was moderately dilated.   5. The mitral valve is normal in structure. Mild mitral valve  regurgitation. No evidence of mitral stenosis.   6. The aortic valve is tricuspid. Aortic valve regurgitation is not  visualized. No aortic stenosis is present.   7. The inferior vena cava is dilated in size with <50% respiratory  variability, suggesting right atrial pressure of 15 mmHg.   Cardiac monitor March 2024: ZIO monitor reviewed.  8 days, 1 hour analyzed.   Predominant rhythm is sinus with heart rate ranging from 59 bpm up to 128 bpm and average heart rate 74 bpm. There were rare PACs including atrial couplets and triplets representing less than 1% total beats. There were rare PVCs including ventricular couplets representing less than 1% total beats. Several episodes of PSVT were noted, all brief with the longest episode lasting 11 beats. There were no sustained arrhythmias or pauses.  Assessment and Plan:  1.  Persistent atrial fibrillation with CHA2DS2-VASc score of 6.  She is status post atrial fibrillation ablation in 2019 with subsequent recurrences requiring cardioversions, on treatment with amiodarone and AV nodal blockers. Also Xarelto for stroke prophylaxis.  She reports no progressive symptoms.  Cardiac monitor from March reviewed above.  No changes were made today.   2. HFpEF, LVEF 55-60% by  echocardiogram in September 2021.  Left atrium moderately dilated at that time with mild mitral regurgitation and moderately increased estimated RVSP.  Clinically stable at this time on Farxiga, Demadex, and potassium supplement.   3.  Coronary calcium score of 63 with mild nonobstructive coronary atherosclerosis by coronary CTA in October 2022.  She does not report any angina he is on Lipitor.  Last LDL 79.  Disposition:  Follow up  6 months.  Signed, Jonelle Sidle, M.D., F.A.C.C. [facility] at [facility]

## 2022-11-10 ENCOUNTER — Other Ambulatory Visit: Payer: Self-pay | Admitting: Medical

## 2022-11-12 ENCOUNTER — Ambulatory Visit (INDEPENDENT_AMBULATORY_CARE_PROVIDER_SITE_OTHER): Payer: Medicare HMO | Admitting: Orthopedic Surgery

## 2022-11-12 VITALS — BP 140/80 | HR 77 | Ht 61.0 in | Wt 155.0 lb

## 2022-11-12 DIAGNOSIS — G8929 Other chronic pain: Secondary | ICD-10-CM | POA: Diagnosis not present

## 2022-11-12 DIAGNOSIS — M25511 Pain in right shoulder: Secondary | ICD-10-CM | POA: Diagnosis not present

## 2022-11-12 DIAGNOSIS — M25512 Pain in left shoulder: Secondary | ICD-10-CM

## 2022-11-12 MED ORDER — METHYLPREDNISOLONE ACETATE 40 MG/ML IJ SUSP
40.0000 mg | Freq: Once | INTRAMUSCULAR | Status: AC
  Administered 2022-11-12: 40 mg via INTRA_ARTICULAR

## 2022-11-12 NOTE — Progress Notes (Signed)
   VISIT TYPE: FOLLOW UP   Chief Complaint  Patient presents with   Follow-up    Encounter Diagnoses  Name Primary?   Chronic left shoulder pain Yes   Chronic right shoulder pain     Assessment and Plan: 77 years old with heart disease on anticoagulants with persistent right and left shoulder pain improved with injection January 2023  Repeat injection in reiterate home exercise program  Again trying to avoid surgical intervention here if she persists with pain though MRI would be indicated  Procedure injection right shoulder subacromial joint and left shoulder subacromial joint   procedure note the subacromial injection shoulder RIGHT  Verbal consent was obtained to inject the  RIGHT   Shoulder  Timeout was completed to confirm the injection site is a subacromial space of the  RIGHT  shoulder   Medication used Depo-Medrol 40 mg and lidocaine 1% 3 cc  Anesthesia was provided by ethyl chloride  The injection was performed in the RIGHT  posterior subacromial space. After pinning the skin with alcohol and anesthetized the skin with ethyl chloride the subacromial space was injected using a 20-gauge needle. There were no complications  Sterile dressing was applied.    Procedure note the subacromial injection shoulder left   Verbal consent was obtained to inject the  Left   Shoulder  Timeout was completed to confirm the injection site is a subacromial space of the  left  shoulder  Medication used Depo-Medrol 40 mg and lidocaine 1% 3 cc  Anesthesia was provided by ethyl chloride  The injection was performed in the left  posterior subacromial space. After pinning the skin with alcohol and anesthetized the skin with ethyl chloride the subacromial space was injected using a 20-gauge needle. There were no complications  Sterile dressing was applied.    Prior treatment:    PT injection Tylenol   HPI:   Assessment and plan   77 year old first presentation with  bilateral shoulder pain with significant cardiac history and anticoagulation recommend nonoperative treatment  PT injection Tylenol     BP (!) 140/80   Pulse 77   Ht 5\' 1"  (1.549 m)   Wt 155 lb (70.3 kg)   BMI 29.29 kg/m   Ortho Exam   Imaging see previous imaging notes  A/P Encounter Diagnoses  Name Primary?   Chronic left shoulder pain Yes   Chronic right shoulder pain     Meds ordered this encounter  Medications   methylPREDNISolone acetate (DEPO-MEDROL) injection 40 mg   methylPREDNISolone acetate (DEPO-MEDROL) injection 40 mg

## 2022-11-24 DIAGNOSIS — E559 Vitamin D deficiency, unspecified: Secondary | ICD-10-CM | POA: Diagnosis not present

## 2022-11-24 DIAGNOSIS — E039 Hypothyroidism, unspecified: Secondary | ICD-10-CM | POA: Diagnosis not present

## 2022-11-24 DIAGNOSIS — R7301 Impaired fasting glucose: Secondary | ICD-10-CM | POA: Diagnosis not present

## 2022-11-24 DIAGNOSIS — D649 Anemia, unspecified: Secondary | ICD-10-CM | POA: Diagnosis not present

## 2022-11-24 DIAGNOSIS — E782 Mixed hyperlipidemia: Secondary | ICD-10-CM | POA: Diagnosis not present

## 2022-11-27 ENCOUNTER — Ambulatory Visit (HOSPITAL_BASED_OUTPATIENT_CLINIC_OR_DEPARTMENT_OTHER): Payer: Medicare HMO | Attending: Primary Care | Admitting: Internal Medicine

## 2022-11-27 VITALS — Ht 61.0 in | Wt 157.0 lb

## 2022-11-27 DIAGNOSIS — I110 Hypertensive heart disease with heart failure: Secondary | ICD-10-CM | POA: Diagnosis not present

## 2022-11-27 DIAGNOSIS — I480 Paroxysmal atrial fibrillation: Secondary | ICD-10-CM | POA: Insufficient documentation

## 2022-11-27 DIAGNOSIS — I509 Heart failure, unspecified: Secondary | ICD-10-CM | POA: Diagnosis not present

## 2022-11-27 DIAGNOSIS — R0683 Snoring: Secondary | ICD-10-CM | POA: Insufficient documentation

## 2022-11-27 DIAGNOSIS — G4736 Sleep related hypoventilation in conditions classified elsewhere: Secondary | ICD-10-CM | POA: Diagnosis not present

## 2022-11-27 DIAGNOSIS — G4761 Periodic limb movement disorder: Secondary | ICD-10-CM | POA: Insufficient documentation

## 2022-11-30 DIAGNOSIS — E039 Hypothyroidism, unspecified: Secondary | ICD-10-CM | POA: Diagnosis not present

## 2022-11-30 DIAGNOSIS — R001 Bradycardia, unspecified: Secondary | ICD-10-CM | POA: Diagnosis not present

## 2022-11-30 DIAGNOSIS — R0683 Snoring: Secondary | ICD-10-CM | POA: Diagnosis not present

## 2022-11-30 DIAGNOSIS — F418 Other specified anxiety disorders: Secondary | ICD-10-CM | POA: Diagnosis not present

## 2022-11-30 DIAGNOSIS — D649 Anemia, unspecified: Secondary | ICD-10-CM | POA: Diagnosis not present

## 2022-11-30 DIAGNOSIS — I10 Essential (primary) hypertension: Secondary | ICD-10-CM | POA: Diagnosis not present

## 2022-11-30 DIAGNOSIS — I480 Paroxysmal atrial fibrillation: Secondary | ICD-10-CM | POA: Diagnosis not present

## 2022-11-30 DIAGNOSIS — I110 Hypertensive heart disease with heart failure: Secondary | ICD-10-CM | POA: Diagnosis not present

## 2022-11-30 DIAGNOSIS — E782 Mixed hyperlipidemia: Secondary | ICD-10-CM | POA: Diagnosis not present

## 2022-12-01 ENCOUNTER — Encounter: Payer: Self-pay | Admitting: Internal Medicine

## 2022-12-05 DIAGNOSIS — R0683 Snoring: Secondary | ICD-10-CM | POA: Diagnosis not present

## 2022-12-05 DIAGNOSIS — I480 Paroxysmal atrial fibrillation: Secondary | ICD-10-CM

## 2022-12-05 NOTE — Procedures (Signed)
Patient Name: Diane Cohen, Diane Cohen Date: 11/27/2022 Gender: Female D.O.B: 1945-03-22 Age (years): 46 Referring Provider: Ames Dura NP Height (inches): 61 Interpreting Physician: Jetty Duhamel MD, ABSM Weight (lbs): 157 RPSGT: Lowry Ram BMI: 30 MRN: [id number] Neck Size: 13.25  CLINICAL INFORMATION Sleep Study Type: NPSG Indication for sleep study: Congestive Heart Failure, Hypertension, Snoring Epworth Sleepiness Score: 20  SLEEP STUDY TECHNIQUE As per the AASM Manual for the Scoring of Sleep and Associated Events v2.3 (April 2016) with a hypopnea requiring 4% desaturations.  The channels recorded and monitored were frontal, central and occipital EEG, electrooculogram (EOG), submentalis EMG (chin), nasal and oral airflow, thoracic and abdominal wall motion, anterior tibialis EMG, snore microphone, electrocardiogram, and pulse oximetry.  MEDICATIONS Medications self-administered by patient taken the night of the study : none reported  SLEEP ARCHITECTURE The study was initiated at 11:03:27 PM and ended at 5:20:11 AM.  Sleep onset time was 14.0 minutes and the sleep efficiency was 82.4%. The total sleep time was 310.2 minutes.  Stage REM latency was 170.5 minutes.  The patient spent 4.7% of the night in stage N1 sleep, 91.8% in stage N2 sleep, 0.0% in stage N3 and 3.6% in REM.  Alpha intrusion was absent.  Supine sleep was 0.00%.  RESPIRATORY PARAMETERS The overall apnea/hypopnea index (AHI) was 2.1 per hour. There were 3 total apneas, including 3 obstructive, 0 central and 0 mixed apneas. There were 8 hypopneas and 3 RERAs.  The AHI during Stage REM sleep was 0.0 per hour.  AHI while supine was N/A per hour.  The mean oxygen saturation was 89.5%. The minimum SpO2 during sleep was 81.0%.  moderate snoring was noted during this study.  CARDIAC DATA The 2 lead EKG demonstrated sinus rhythm. The mean heart rate was 61.5 beats per minute. Other EKG findings  include: PACs  LEG MOVEMENT DATA Total 626  IMPRESSIONS - No significant obstructive sleep apnea occurred during this study (AHI = 2.1/h). - Supplemental O2 1L added per protocol at 12:05AM for persistent O2 saturation 81-89%. Subsequent O2 saturation 89-90% - The patient snored with moderate snoring volume. - Frequent PACs. - Limb movement total 626 (121/hr). Limb movement with arousal/awakening 78 (15/hr).  DIAGNOSIS - Nocturnal Hypoxemia (G47.36) - Periodic Limb Movement Sleep Disorder  RECOMMENDATIONS - Consider pregabalin, Mirapex, Requip, or Sinemet for treatment of Periodic Leg Movements of Sleep. - Evaluate need for supplemental O2 during sleep. - Sleep hygiene should be reviewed to assess factors that may improve sleep quality. - Weight management and regular exercise should be initiated or continued if appropriate.  [Electronically signed] 12/05/2022 11:35 AM  Jetty Duhamel MD, ABSM Diplomate, American Board of Sleep Medicine NPI: [id number]                          Jetty Duhamel Diplomate, American Board of Sleep Medicine  ELECTRONICALLY SIGNED ON:  12/05/2022, 11:27 AM [facility] PH: [phone]   FX: [phone] ACCREDITED BY THE AMERICAN ACADEMY OF SLEEP MEDICINE

## 2022-12-14 ENCOUNTER — Telehealth: Payer: Self-pay | Admitting: Primary Care

## 2022-12-14 DIAGNOSIS — G4734 Idiopathic sleep related nonobstructive alveolar hypoventilation: Secondary | ICD-10-CM

## 2022-12-14 NOTE — Telephone Encounter (Signed)
Patient calling for sleep study results from 11/27/2022 test. Please advise.

## 2022-12-15 NOTE — Telephone Encounter (Signed)
Patient is aware of results and voiced her understanding. She agrees with oxygen. Order has been placed.  Appt scheduled 03/04/2023. Nothing further needed.

## 2022-12-15 NOTE — Telephone Encounter (Signed)
No significant sleep apnea, nocturnal hypoxia and periodic limb movement was noted Needs 2L oxygen at bedtime, please order  Needs visit to discuss in more detail.

## 2022-12-15 NOTE — Telephone Encounter (Signed)
Lm x1 for patient.   [name], please advise on sleep study results. Thanks

## 2022-12-23 ENCOUNTER — Other Ambulatory Visit: Payer: Self-pay | Admitting: Medical

## 2022-12-23 ENCOUNTER — Other Ambulatory Visit: Payer: Self-pay | Admitting: Cardiology

## 2022-12-23 NOTE — Telephone Encounter (Signed)
Prescription refill request for Xarelto received.  Indication: AF Last office visit: 11/06/22  Diane Bible MD Weight: 71.9kg Age: 77 Scr: 1.45 on 11/24/22  Epic CrCl: 36.88   Pt is taking Xarelto 20mg  daily.  Based on above findings Xarelto 15ng daily is the appropriate dose.  Message sent to PharmD Pool to advise on dose reduction.

## 2022-12-25 NOTE — Telephone Encounter (Addendum)
Called pt and LMOM to call office back.   Pt called back. Informed her that her Xarelto dose was changing from Xarelto 20mg  daily to Xarelto 15mg  daily and when she picks up her refill it will be for Xarelto 15mg .   Scr: 1.45 11/24/2022 Weight: 71.2 kg  CrCl: 37 ml/min  Age: 77 yo  Originally sent in for Xarelto 15mg  daily- 1 month supply with 5 refills. Called and spoke to [place] from [place] and changed prescription to a 90day supply with no refills. Requested Xarelto 20 mg be taken off pt's medlist.

## 2023-01-06 ENCOUNTER — Other Ambulatory Visit: Payer: Self-pay | Admitting: Cardiology

## 2023-02-27 ENCOUNTER — Other Ambulatory Visit: Payer: Self-pay | Admitting: Cardiology

## 2023-02-27 ENCOUNTER — Other Ambulatory Visit: Payer: Self-pay | Admitting: Medical

## 2023-03-04 ENCOUNTER — Ambulatory Visit: Payer: Medicare HMO | Admitting: Primary Care

## 2023-03-16 ENCOUNTER — Telehealth: Payer: Self-pay | Admitting: Primary Care

## 2023-03-16 NOTE — Telephone Encounter (Signed)
 Left message for patient to call and confirm mailing address.

## 2023-03-31 ENCOUNTER — Other Ambulatory Visit: Payer: Self-pay | Admitting: Cardiology

## 2023-03-31 DIAGNOSIS — J209 Acute bronchitis, unspecified: Secondary | ICD-10-CM | POA: Diagnosis not present

## 2023-03-31 NOTE — Telephone Encounter (Signed)
Prescription refill request for Xarelto received.  Indication: AF Last office visit: 11/06/22  Ival Bible MD Weight: 71.9kg Age: 78 Scr: 1.45 on 11/25/22 CrCl: 36.88  Based on above findings Xarelto 15mg  daily is the appropriate dose.  Refill approved.

## 2023-04-19 ENCOUNTER — Encounter: Payer: Self-pay | Admitting: Cardiology

## 2023-04-19 ENCOUNTER — Ambulatory Visit: Payer: Medicare HMO | Attending: Cardiology | Admitting: Cardiology

## 2023-04-19 VITALS — BP 136/86 | HR 86 | Ht 61.0 in | Wt 165.6 lb

## 2023-04-19 DIAGNOSIS — I5030 Unspecified diastolic (congestive) heart failure: Secondary | ICD-10-CM

## 2023-04-19 DIAGNOSIS — I2510 Atherosclerotic heart disease of native coronary artery without angina pectoris: Secondary | ICD-10-CM

## 2023-04-19 DIAGNOSIS — I4819 Other persistent atrial fibrillation: Secondary | ICD-10-CM | POA: Diagnosis not present

## 2023-04-19 NOTE — Progress Notes (Signed)
    Cardiology Office Note  Date: 04/19/2023   ID: Aidaly, Escatel 20-Oct-1945, MRN [id number]  History of Present Illness: Diane Cohen is a 78 y.o. female last seen in August 2024.  She is here for a follow-up visit.  States that she has done well in terms of atrial fibrillation, cannot recall any major interval episodes.  No exertional chest pain.  She has been trying to do some walking for exercise, about 20 minutes at a time.  I reviewed her medications.  Current regimen includes amiodarone , Lipitor, Farxiga , Cardizem  CD, as needed short acting Cardizem , lisinopril , Demadex , potassium supplement, Toprol -XL, and Xarelto .  She does not report any spontaneous bleeding problems.  Now taking her Demadex  with potassium supplement 1 pill a day and using an extra as needed.  Physical Exam: VS:  BP 136/86   Pulse 86   Ht 5\' 1"  (1.549 m)   Wt 165 lb 9.6 oz (75.1 kg)   SpO2 95%   BMI 31.29 kg/m , BMI Body mass index is 31.29 kg/m.  Wt Readings from Last 3 Encounters:  04/19/23 165 lb 9.6 oz (75.1 kg)  11/27/22 157 lb (71.2 kg)  11/12/22 155 lb (70.3 kg)    General: Patient appears comfortable at rest. HEENT: Conjunctiva and lids normal. Neck: Supple, no elevated JVP or carotid bruits. Lungs: Clear to auscultation, nonlabored breathing at rest. Cardiac: Regular rate and rhythm, no S3, 2/6 systolic murmur. Extremities: No pitting edema.  ECG:  An ECG dated 11/06/2022 was personally reviewed today and demonstrated:  Sinus rhythm with decreased R wave progression.  Labwork:  September 2024: TSH 5.6, free T4 1.71, hemoglobin 12.9, platelets 305, BUN 29, creatinine 1.45, potassium 4.5, AST 33, ALT 30, cholesterol 187, triglycerides 66, HDL 93, LDL 82, hemoglobin A1c 5.3%  Other Studies Reviewed Today:  No interval cardiac testing for review today.  Assessment and Plan:  1.  Persistent atrial fibrillation with CHA2DS2-VASc score of 6.  She is status post atrial fibrillation ablation  in 2019 with subsequent recurrences requiring cardioversions, on treatment with amiodarone  and AV nodal blockers.  Doing well at this point with no recurring sense of palpitations.  She remains on Xarelto  for stroke prophylaxis, no spontaneous bleeding problems.  I reviewed her interval lab work.   2. HFpEF, LVEF 55-60% by echocardiogram in September 2021.  Left atrium moderately dilated at that time with mild mitral regurgitation and moderately increased estimated RVSP.  Continue Demadex  with potassium supplement along with Farxiga .   3.  Coronary calcium  score of 63 with mild nonobstructive coronary atherosclerosis by coronary CTA in October 2022.  She does not report any angina he is on Lipitor.  LDL 82 and HDL 93 in September 2024.  Disposition:  Follow up  6 months.  Signed, Gerard Knight, M.D., F.A.C.C. [facility] at [facility]

## 2023-04-19 NOTE — Patient Instructions (Addendum)

## 2023-04-21 ENCOUNTER — Other Ambulatory Visit: Payer: Self-pay

## 2023-04-21 MED ORDER — TORSEMIDE 20 MG PO TABS: 20.0000 mg | ORAL_TABLET | Freq: Every day | ORAL | 3 refills | Status: DC

## 2023-04-26 ENCOUNTER — Ambulatory Visit (INDEPENDENT_AMBULATORY_CARE_PROVIDER_SITE_OTHER): Payer: Medicare HMO | Admitting: Primary Care

## 2023-04-26 ENCOUNTER — Encounter: Payer: Self-pay | Admitting: Primary Care

## 2023-04-26 VITALS — BP 136/68 | HR 78 | Ht 61.0 in | Wt 164.0 lb

## 2023-04-26 DIAGNOSIS — G4761 Periodic limb movement disorder: Secondary | ICD-10-CM | POA: Diagnosis not present

## 2023-04-26 DIAGNOSIS — I480 Paroxysmal atrial fibrillation: Secondary | ICD-10-CM | POA: Diagnosis not present

## 2023-04-26 DIAGNOSIS — G4734 Idiopathic sleep related nonobstructive alveolar hypoventilation: Secondary | ICD-10-CM

## 2023-04-26 MED ORDER — PREGABALIN 75 MG PO CAPS: 75.0000 mg | ORAL_CAPSULE | Freq: Every day | ORAL | 5 refills | Status: DC

## 2023-04-26 NOTE — Patient Instructions (Addendum)
-  NOCTURNAL HYPOXEMIA: Nocturnal hypoxemia means that your oxygen levels drop during sleep. We recommend you use your supplemental oxygen at 2 liters consistently during sleep. If the noise from the machine continues to be a problem, consider contacting the oxygen supply company for servicing.  -PERIODIC LIMB MOVEMENT: Periodic limb movement involves involuntary movements of your limbs during sleep, which can disrupt your rest. We may start you on medication called  Lyrica (starting dose is 75mg  but can be increased if needed). Take medication 1-2 hours before bedtime to help with this. We will follow up in 3-4 months to see how the medication is working, but you can contact us sooner if needed.  -SNORING: Snoring is the sound made when air flows past relaxed tissues in your throat, causing them to vibrate. To help reduce snoring, avoid sleeping on your back and consider using a wedge pillow. Based on your sleep study, you do not need a CPAP machine.  -ATRIAL FIBRILLATION: Atrial fibrillation is an irregular and often rapid heart rate. You have a history of this condition, but there have been no recent attacks. Continue with your current management plan as directed by your cardiologist and use your supplemental oxygen consistently during sleep to help reduce any related symptoms.  INSTRUCTIONS:  Please use your supplemental oxygen at 2 liters consistently during sleep. If the noise from the machine is disruptive, contact the oxygen supply company for servicing. We will follow up in 3-4 months to assess the effectiveness of the medication for your periodic limb movements, but feel free to reach out sooner if adjustments are needed. Continue with your current management plan for atrial fibrillation as directed by your cardiologist.  Follow-up 3-4 months with [facility] NP or reach out to [place] sooner if needed

## 2023-04-26 NOTE — Progress Notes (Signed)
@Patient  ID: Ysidro Evert, female    DOB: August 24, 1945, 78 y.o.   MRN: [id number]  Chief Complaint  Patient presents with   Results    Sleep Study 11/2022    Referring provider: Benita Stabile, MD  HPI: 78 year old female, former smoker. PMH significant for afib, CHF, HTN, hypothyroidism, anemia.  Previous [facility] pulmonary encounter: 08/19/2022 Patient presents today for sleep consult. Patient was referred today by primary care due to A-fib.  Patient has symptoms of snoring.  Mild associated daytime fatigue.  She reports sleeping well at night. Patient reports most recent episode of A-fib was last week. Typical bedtime is between 1 AM and 4 AM.  It does not take her long to fall asleep.  She does not wake up after falling asleep.  She starts her day between 930 and 10 AM.  She had a previous study done in [place] approximately 10 years ago, results are not available.  She is not currently on CPAP and does not wear oxygen.  No respiratory complaints.  No concern for narcolepsy or cataplexy.  Epworth score at 16.  04/26/2023 Discussed the use of AI scribe software for clinical note transcription with the patient, who gave verbal consent to proceed.  History of Present Illness   Teara B Lizana is a 78 year old female with atrial fibrillation who presents with sleep disturbances and nocturnal hypoxemia. She was referred by primary care for evaluation of atrial fibrillation.  PSG on 11/27/22 was negative for obstructive sleep apnea, AHI 2.1/hour. Supplemental O2 was added. Th patient snored with moderate volume along with PACs. Dx nocturnal hypoxia and periodic limb movement was noted   She experiences nocturnal hypoxemia with oxygen levels dropping into the 80s during sleep, as identified in a recent sleep study. This has necessitated the use of supplemental oxygen. She has been provided with an oxygen concentrator set at two liters but reports inconsistent use due to its loudness of the machine.  She  reports sleep disturbances characterized by snoring and daytime fatigue. Her typical bedtime is between 1 and 4 AM, and she usually falls asleep quickly without waking up during the night. However, she has been waking up more frequently recently. She starts her day between 9:30 and 10 AM. A sleep study revealed moderate loud snoring and premature atrial contractions, which may be related to the oxygen drops at night. Occasionally, she wakes herself up with snoring. Her husband, who used to comment on her snoring, passed away two years ago, so she no longer has someone to report on her snoring.  She experiences periodic limb movements during sleep, which sometimes wake her up. She has not been on CPAP or oxygen therapy previously.      Allergies  Allergen Reactions   Levaquin [Levofloxacin] Other (See Comments)    Headache    Sulfa Antibiotics Hives    Immunization History  Administered Date(s) Administered   Influenza, High Dose Seasonal PF 01/15/2018   Influenza,inj,Quad PF,6+ Mos 12/29/2013   Influenza-Unspecified 02/07/2015    Past Medical History:  Diagnosis Date   Anxiety    Atrial fibrillation [facility])    Atrial fibrillation ablation in 2019 - Dr. Johney Frame   Chronic diastolic heart failure (HCC)    Depression    Essential hypertension    History of TIA (transient ischemic attack)    Hyperlipidemia    Hypothyroidism    Pneumonia     Tobacco History: Social History   Tobacco Use  Smoking Status  Former   Current packs/day: 0.00   Types: Cigarettes   Quit date: 01/22/1973   Years since quitting: 50.2  Smokeless Tobacco Never   Counseling given: Not Answered   Outpatient Medications Prior to Visit  Medication Sig Dispense Refill   acetaminophen (TYLENOL) 650 MG CR tablet Take 1,300 mg by mouth every 8 (eight) hours as needed for pain. Arthritis Strength     amiodarone (PACERONE) 200 MG tablet TAKE 1 TABLET(200 MG) BY MOUTH DAILY 90 tablet 3   atorvastatin (LIPITOR) 20  MG tablet Take 20 mg by mouth daily.     dapagliflozin propanediol (FARXIGA) 10 MG TABS tablet Take 1 tablet (10 mg total) by mouth daily before breakfast. 90 tablet 3   diltiazem (CARDIZEM CD) 180 MG 24 hr capsule TAKE 1 CAPSULE(180 MG) BY MOUTH DAILY 90 capsule 3   diltiazem (CARDIZEM) 30 MG tablet Take 1 tablet (30 mg total) by mouth 4 (four) times daily as needed (for atrial fibrillation). 60 tablet 3   DULoxetine (CYMBALTA) 60 MG capsule Take 1 capsule (60 mg total) by mouth daily. 30 capsule 1   Iron-FA-B Cmp-C-Biot-Probiotic (FUSION PLUS) CAPS Take 1 capsule by mouth daily.     levothyroxine (SYNTHROID) 125 MCG tablet Take 125 mcg by mouth every morning.     lisinopril (ZESTRIL) 10 MG tablet TAKE 1 TABLET(10 MG) BY MOUTH DAILY 90 tablet 3   magnesium oxide (MAG-OX) 400 MG tablet Take 1 tablet (400 mg total) by mouth daily. 20 tablet 0   metoprolol succinate (TOPROL-XL) 50 MG 24 hr tablet Take 25 mg by mouth daily. Take with or immediately following a meal.     potassium chloride SA (KLOR-CON M) 20 MEQ tablet TAKE 1 TABLET(20 MEQ) BY MOUTH DAILY 90 tablet 3   Rivaroxaban (XARELTO) 15 MG TABS tablet TAKE 1 TABLET(15 MG) BY MOUTH DAILY WITH SUPPER 90 tablet 1   torsemide (DEMADEX) 20 MG tablet Take 1 tablet (20 mg total) by mouth daily. 90 tablet 3   No facility-administered medications prior to visit.      Review of Systems  Review of Systems  Constitutional: Negative.   Respiratory: Negative.    Psychiatric/Behavioral:  Positive for sleep disturbance.     Physical Exam  BP 136/68   Pulse 78   Ht 5\' 1"  (1.549 m)   Wt 164 lb (74.4 kg)   SpO2 95%   BMI 30.99 kg/m  Physical Exam Constitutional:      General: She is not in acute distress.    Appearance: Normal appearance. She is not ill-appearing.  HENT:     Mouth/Throat:     Mouth: Mucous membranes are moist.     Pharynx: Oropharynx is clear.  Cardiovascular:     Rate and Rhythm: Normal rate and regular rhythm.      Comments: RRR Pulmonary:     Effort: Pulmonary effort is normal.     Breath sounds: Normal breath sounds. No wheezing or rhonchi.  Musculoskeletal:        General: Normal range of motion.  Skin:    General: Skin is warm and dry.  Neurological:     General: No focal deficit present.     Mental Status: She is alert and oriented to person, place, and time. Mental status is at baseline.  Psychiatric:        Mood and Affect: Mood normal.        Behavior: Behavior normal.        Thought Content: Thought content  normal.        Judgment: Judgment normal.      Lab Results:  CBC    Component Value Date/Time   WBC 5.2 09/06/2020 1333   RBC 3.78 (L) 09/06/2020 1333   HGB 12.1 09/06/2020 1333   HGB 12.4 01/20/2017 1538   HCT 37.5 09/06/2020 1333   HCT 39.5 01/20/2017 1538   PLT 246 09/06/2020 1333   PLT 299 01/20/2017 1538   MCV 99.2 09/06/2020 1333   MCV 91 01/20/2017 1538   MCH 32.0 09/06/2020 1333   MCHC 32.3 09/06/2020 1333   RDW 14.6 09/06/2020 1333   RDW 19.2 (H) 01/20/2017 1538   LYMPHSABS 1.3 05/21/2020 1458   LYMPHSABS 1.2 01/20/2017 1538   MONOABS 0.4 05/21/2020 1458   EOSABS 0.1 05/21/2020 1458   EOSABS 0.1 01/20/2017 1538   BASOSABS 0.0 05/21/2020 1458   BASOSABS 0.0 01/20/2017 1538    BMET    Component Value Date/Time   NA 139 04/18/2021 1551   NA 142 01/20/2017 1538   K 4.3 04/18/2021 1551   CL 104 04/18/2021 1551   CO2 28 04/18/2021 1551   GLUCOSE 80 04/18/2021 1551   BUN 21 04/18/2021 1551   BUN 14 01/20/2017 1538   CREATININE 1.25 (H) 04/18/2021 1551   CREATININE 0.94 (H) 12/22/2018 1313   CALCIUM 9.9 04/18/2021 1551   GFRNONAA 45 (L) 04/18/2021 1551   GFRAA 48 (L) 12/01/2019 1506    BNP    Component Value Date/Time   BNP 337.0 (H) 06/23/2019 1209    ProBNP    Component Value Date/Time   PROBNP 1,859.0 (H) 12/29/2013 0520    Imaging: No results found.   Assessment & Plan:      Nocturnal Hypoxemia PSG in September 2024 was  negative for OSA but oxygen saturation levels did drop in the 80s. Patient has been provided with supplemental oxygen but reports inconsistent use due to noise from the machine. -Encourage consistent use of supplemental oxygen at 1-2 liters during sleep. -Consider contacting oxygen supply company for potential servicing if noise continues to be disruptive.  Periodic Limb Movement Limb movements were noted during in-lab sleep study which were associated with arousals. Patient reports these movements are disruptive to sleep. -Recommend starting Lyrica 75mg  at bedtime to help prevent restless legs and periodic limb movements. -Schedule follow-up in 3-4 months to assess effectiveness of medication, with option for patient to contact sooner via MyChart or phone call if adjustment is needed.  Snoring Patient reports occasional snoring and waking herself up. No significant obstructive sleep apnea noted in sleep study. -Advise patient to avoid sleeping on back and consider use of a wedge pillow to potentially reduce snoring. -Reiterate that CPAP is not needed based on sleep study results.  Atrial Fibrillation History of atrial fibrillation, with recent report of no attacks. Noted premature atrial contractions during sleep study, potentially related to nocturnal hypoxemia. -Continue current management plan for atrial fibrillation as directed by cardiologist. -Encourage consistent use of supplemental oxygen during sleep to potentially reduce cardiac symptoms.      Glenford Bayley, NP 04/26/2023

## 2023-05-24 DIAGNOSIS — R7301 Impaired fasting glucose: Secondary | ICD-10-CM | POA: Diagnosis not present

## 2023-05-24 DIAGNOSIS — E039 Hypothyroidism, unspecified: Secondary | ICD-10-CM | POA: Diagnosis not present

## 2023-05-24 DIAGNOSIS — R944 Abnormal results of kidney function studies: Secondary | ICD-10-CM | POA: Diagnosis not present

## 2023-05-24 DIAGNOSIS — E782 Mixed hyperlipidemia: Secondary | ICD-10-CM | POA: Diagnosis not present

## 2023-05-24 DIAGNOSIS — E559 Vitamin D deficiency, unspecified: Secondary | ICD-10-CM | POA: Diagnosis not present

## 2023-05-24 DIAGNOSIS — D649 Anemia, unspecified: Secondary | ICD-10-CM | POA: Diagnosis not present

## 2023-05-29 ENCOUNTER — Other Ambulatory Visit: Payer: Self-pay | Admitting: Cardiology

## 2023-05-31 ENCOUNTER — Other Ambulatory Visit (HOSPITAL_COMMUNITY): Payer: Self-pay | Admitting: Internal Medicine

## 2023-05-31 ENCOUNTER — Other Ambulatory Visit: Payer: Self-pay

## 2023-05-31 DIAGNOSIS — G4761 Periodic limb movement disorder: Secondary | ICD-10-CM | POA: Insufficient documentation

## 2023-05-31 DIAGNOSIS — I10 Essential (primary) hypertension: Secondary | ICD-10-CM | POA: Diagnosis not present

## 2023-05-31 DIAGNOSIS — E039 Hypothyroidism, unspecified: Secondary | ICD-10-CM | POA: Diagnosis not present

## 2023-05-31 DIAGNOSIS — R0683 Snoring: Secondary | ICD-10-CM | POA: Diagnosis not present

## 2023-05-31 DIAGNOSIS — F418 Other specified anxiety disorders: Secondary | ICD-10-CM | POA: Diagnosis not present

## 2023-05-31 DIAGNOSIS — R001 Bradycardia, unspecified: Secondary | ICD-10-CM | POA: Diagnosis not present

## 2023-05-31 DIAGNOSIS — I110 Hypertensive heart disease with heart failure: Secondary | ICD-10-CM | POA: Diagnosis not present

## 2023-05-31 DIAGNOSIS — D649 Anemia, unspecified: Secondary | ICD-10-CM | POA: Diagnosis not present

## 2023-05-31 DIAGNOSIS — I480 Paroxysmal atrial fibrillation: Secondary | ICD-10-CM | POA: Diagnosis not present

## 2023-05-31 DIAGNOSIS — R918 Other nonspecific abnormal finding of lung field: Secondary | ICD-10-CM

## 2023-05-31 MED ORDER — METOPROLOL SUCCINATE ER 25 MG PO TB24: 25.0000 mg | ORAL_TABLET | Freq: Every day | ORAL | 2 refills | Status: DC

## 2023-06-04 ENCOUNTER — Emergency Department (HOSPITAL_COMMUNITY)

## 2023-06-04 ENCOUNTER — Other Ambulatory Visit: Payer: Self-pay

## 2023-06-04 ENCOUNTER — Inpatient Hospital Stay (HOSPITAL_COMMUNITY)
Admission: EM | Admit: 2023-06-04 | Discharge: 2023-06-12 | DRG: 193 | Disposition: A | Discharge status: Home / Self Care | Attending: Internal Medicine | Admitting: Internal Medicine

## 2023-06-04 ENCOUNTER — Encounter (HOSPITAL_COMMUNITY): Payer: Self-pay | Admitting: *Deleted

## 2023-06-04 DIAGNOSIS — E876 Hypokalemia: Secondary | ICD-10-CM | POA: Diagnosis present

## 2023-06-04 DIAGNOSIS — E869 Volume depletion, unspecified: Secondary | ICD-10-CM | POA: Diagnosis present

## 2023-06-04 DIAGNOSIS — I4891 Unspecified atrial fibrillation: Secondary | ICD-10-CM | POA: Diagnosis present

## 2023-06-04 DIAGNOSIS — Z882 Allergy status to sulfonamides status: Secondary | ICD-10-CM | POA: Diagnosis not present

## 2023-06-04 DIAGNOSIS — I10 Essential (primary) hypertension: Secondary | ICD-10-CM | POA: Diagnosis present

## 2023-06-04 DIAGNOSIS — E785 Hyperlipidemia, unspecified: Secondary | ICD-10-CM | POA: Diagnosis present

## 2023-06-04 DIAGNOSIS — Z8249 Family history of ischemic heart disease and other diseases of the circulatory system: Secondary | ICD-10-CM

## 2023-06-04 DIAGNOSIS — Z881 Allergy status to other antibiotic agents status: Secondary | ICD-10-CM

## 2023-06-04 DIAGNOSIS — Z87891 Personal history of nicotine dependence: Secondary | ICD-10-CM | POA: Diagnosis not present

## 2023-06-04 DIAGNOSIS — R069 Unspecified abnormalities of breathing: Secondary | ICD-10-CM | POA: Diagnosis not present

## 2023-06-04 DIAGNOSIS — I4819 Other persistent atrial fibrillation: Secondary | ICD-10-CM | POA: Diagnosis present

## 2023-06-04 DIAGNOSIS — J189 Pneumonia, unspecified organism: Secondary | ICD-10-CM | POA: Diagnosis present

## 2023-06-04 DIAGNOSIS — E039 Hypothyroidism, unspecified: Secondary | ICD-10-CM | POA: Diagnosis present

## 2023-06-04 DIAGNOSIS — R918 Other nonspecific abnormal finding of lung field: Secondary | ICD-10-CM | POA: Diagnosis not present

## 2023-06-04 DIAGNOSIS — Z79890 Hormone replacement therapy (postmenopausal): Secondary | ICD-10-CM

## 2023-06-04 DIAGNOSIS — G2581 Restless legs syndrome: Secondary | ICD-10-CM | POA: Diagnosis present

## 2023-06-04 DIAGNOSIS — J441 Chronic obstructive pulmonary disease with (acute) exacerbation: Secondary | ICD-10-CM | POA: Diagnosis not present

## 2023-06-04 DIAGNOSIS — J9621 Acute and chronic respiratory failure with hypoxia: Secondary | ICD-10-CM | POA: Diagnosis present

## 2023-06-04 DIAGNOSIS — R600 Localized edema: Principal | ICD-10-CM

## 2023-06-04 DIAGNOSIS — R0602 Shortness of breath: Secondary | ICD-10-CM | POA: Diagnosis not present

## 2023-06-04 DIAGNOSIS — Z79899 Other long term (current) drug therapy: Secondary | ICD-10-CM | POA: Diagnosis not present

## 2023-06-04 DIAGNOSIS — Z8673 Personal history of transient ischemic attack (TIA), and cerebral infarction without residual deficits: Secondary | ICD-10-CM

## 2023-06-04 DIAGNOSIS — J181 Lobar pneumonia, unspecified organism: Principal | ICD-10-CM | POA: Diagnosis present

## 2023-06-04 DIAGNOSIS — E0590 Thyrotoxicosis, unspecified without thyrotoxic crisis or storm: Secondary | ICD-10-CM | POA: Diagnosis present

## 2023-06-04 DIAGNOSIS — Z90710 Acquired absence of both cervix and uterus: Secondary | ICD-10-CM | POA: Diagnosis not present

## 2023-06-04 DIAGNOSIS — I5032 Chronic diastolic (congestive) heart failure: Secondary | ICD-10-CM | POA: Diagnosis present

## 2023-06-04 DIAGNOSIS — Z7901 Long term (current) use of anticoagulants: Secondary | ICD-10-CM | POA: Diagnosis not present

## 2023-06-04 DIAGNOSIS — I130 Hypertensive heart and chronic kidney disease with heart failure and stage 1 through stage 4 chronic kidney disease, or unspecified chronic kidney disease: Secondary | ICD-10-CM | POA: Diagnosis present

## 2023-06-04 DIAGNOSIS — R059 Cough, unspecified: Secondary | ICD-10-CM | POA: Diagnosis not present

## 2023-06-04 DIAGNOSIS — G4761 Periodic limb movement disorder: Secondary | ICD-10-CM | POA: Diagnosis present

## 2023-06-04 DIAGNOSIS — R1013 Epigastric pain: Secondary | ICD-10-CM

## 2023-06-04 DIAGNOSIS — N170 Acute kidney failure with tubular necrosis: Secondary | ICD-10-CM | POA: Diagnosis not present

## 2023-06-04 DIAGNOSIS — R0902 Hypoxemia: Secondary | ICD-10-CM | POA: Diagnosis not present

## 2023-06-04 DIAGNOSIS — I700 Atherosclerosis of aorta: Secondary | ICD-10-CM | POA: Diagnosis not present

## 2023-06-04 DIAGNOSIS — M7989 Other specified soft tissue disorders: Secondary | ICD-10-CM

## 2023-06-04 DIAGNOSIS — Z1152 Encounter for screening for COVID-19: Secondary | ICD-10-CM | POA: Diagnosis not present

## 2023-06-04 DIAGNOSIS — N1831 Chronic kidney disease, stage 3a: Secondary | ICD-10-CM | POA: Diagnosis present

## 2023-06-04 DIAGNOSIS — R062 Wheezing: Secondary | ICD-10-CM | POA: Diagnosis not present

## 2023-06-04 DIAGNOSIS — M545 Low back pain, unspecified: Secondary | ICD-10-CM

## 2023-06-04 DIAGNOSIS — R231 Pallor: Secondary | ICD-10-CM | POA: Diagnosis not present

## 2023-06-04 DIAGNOSIS — I480 Paroxysmal atrial fibrillation: Secondary | ICD-10-CM | POA: Diagnosis present

## 2023-06-04 LAB — CBC WITH DIFFERENTIAL/PLATELET
Abs Immature Granulocytes: 0.03 10*3/uL (ref 0.00–0.07)
Basophils Absolute: 0 10*3/uL (ref 0.0–0.1)
Basophils Relative: 0 %
Eosinophils Absolute: 0 10*3/uL (ref 0.0–0.5)
Eosinophils Relative: 0 %
HCT: 40.1 % (ref 36.0–46.0)
Hemoglobin: 12.8 g/dL (ref 12.0–15.0)
Immature Granulocytes: 0 %
Lymphocytes Relative: 9 %
Lymphs Abs: 0.7 10*3/uL (ref 0.7–4.0)
MCH: 32.5 pg (ref 26.0–34.0)
MCHC: 31.9 g/dL (ref 30.0–36.0)
MCV: 101.8 fL — ABNORMAL HIGH (ref 80.0–100.0)
Monocytes Absolute: 0.3 10*3/uL (ref 0.1–1.0)
Monocytes Relative: 4 %
Neutro Abs: 6.7 10*3/uL (ref 1.7–7.7)
Neutrophils Relative %: 87 %
Platelets: 186 10*3/uL (ref 150–400)
RBC: 3.94 MIL/uL (ref 3.87–5.11)
RDW: 15.9 % — ABNORMAL HIGH (ref 11.5–15.5)
WBC: 7.7 10*3/uL (ref 4.0–10.5)
nRBC: 0 % (ref 0.0–0.2)

## 2023-06-04 LAB — RESP PANEL BY RT-PCR (RSV, FLU A&B, COVID)  RVPGX2
Influenza A by PCR: NEGATIVE
Influenza B by PCR: NEGATIVE
Resp Syncytial Virus by PCR: NEGATIVE
SARS Coronavirus 2 by RT PCR: NEGATIVE

## 2023-06-04 LAB — COMPREHENSIVE METABOLIC PANEL WITH GFR
ALT: 31 U/L (ref 0–44)
AST: 49 U/L — ABNORMAL HIGH (ref 15–41)
Albumin: 3.8 g/dL (ref 3.5–5.0)
Alkaline Phosphatase: 118 U/L (ref 38–126)
Anion gap: 10 (ref 5–15)
BUN: 16 mg/dL (ref 8–23)
CO2: 23 mmol/L (ref 22–32)
Calcium: 9.5 mg/dL (ref 8.9–10.3)
Chloride: 103 mmol/L (ref 98–111)
Creatinine, Ser: 0.93 mg/dL (ref 0.44–1.00)
GFR, Estimated: >60 mL/min (ref >60)
Glucose, Bld: 139 mg/dL — ABNORMAL HIGH (ref 70–99)
Potassium: 3.3 mmol/L — ABNORMAL LOW (ref 3.5–5.1)
Sodium: 136 mmol/L (ref 135–145)
Total Bilirubin: 1 mg/dL (ref 0.0–1.2)
Total Protein: 7.1 g/dL (ref 6.5–8.1)

## 2023-06-04 LAB — BRAIN NATRIURETIC PEPTIDE: B Natriuretic Peptide: 289 pg/mL — ABNORMAL HIGH (ref 0.0–100.0)

## 2023-06-04 MED ORDER — HYDRALAZINE HCL 20 MG/ML IJ SOLN: 10.0000 mg | INTRAMUSCULAR | Status: DC | PRN

## 2023-06-04 MED ORDER — DULOXETINE HCL 60 MG PO CPEP
60.0000 mg | ORAL_CAPSULE | Freq: Every day | ORAL | Status: DC
  Administered 2023-06-04 – 2023-06-12 (×9): 60 mg via ORAL
  Filled 2023-06-04 (×9): qty 1

## 2023-06-04 MED ORDER — AMIODARONE HCL 200 MG PO TABS
200.0000 mg | ORAL_TABLET | Freq: Every day | ORAL | Status: DC
  Administered 2023-06-04 – 2023-06-12 (×9): 200 mg via ORAL
  Filled 2023-06-04 (×9): qty 1

## 2023-06-04 MED ORDER — ACETAMINOPHEN 650 MG RE SUPP: 650.0000 mg | Freq: Four times a day (QID) | RECTAL | Status: DC | PRN

## 2023-06-04 MED ORDER — DILTIAZEM HCL ER COATED BEADS 180 MG PO CP24
180.0000 mg | ORAL_CAPSULE | Freq: Every day | ORAL | Status: DC
  Administered 2023-06-04 – 2023-06-12 (×9): 180 mg via ORAL
  Filled 2023-06-04 (×9): qty 1

## 2023-06-04 MED ORDER — BISACODYL 5 MG PO TBEC: 5.0000 mg | DELAYED_RELEASE_TABLET | Freq: Every day | ORAL | Status: DC | PRN

## 2023-06-04 MED ORDER — SODIUM CHLORIDE 0.9 % IV SOLN
500.0000 mg | INTRAVENOUS | Status: DC
  Administered 2023-06-05 – 2023-06-08 (×4): 500 mg via INTRAVENOUS
  Filled 2023-06-04 (×4): qty 5

## 2023-06-04 MED ORDER — DAPAGLIFLOZIN PROPANEDIOL 10 MG PO TABS
10.0000 mg | ORAL_TABLET | Freq: Every day | ORAL | Status: DC
  Administered 2023-06-05 – 2023-06-12 (×8): 10 mg via ORAL
  Filled 2023-06-04 (×9): qty 1

## 2023-06-04 MED ORDER — SODIUM CHLORIDE 0.9 % IV SOLN
2.0000 g | INTRAVENOUS | Status: AC
  Administered 2023-06-05 – 2023-06-09 (×5): 2 g via INTRAVENOUS
  Filled 2023-06-04 (×6): qty 20

## 2023-06-04 MED ORDER — MAGNESIUM CITRATE PO SOLN: 1.0000 | Freq: Once | ORAL | Status: DC | PRN

## 2023-06-04 MED ORDER — IPRATROPIUM-ALBUTEROL 0.5-2.5 (3) MG/3ML IN SOLN
3.0000 mL | Freq: Once | RESPIRATORY_TRACT | Status: AC
  Administered 2023-06-04: 3 mL via RESPIRATORY_TRACT
  Filled 2023-06-04: qty 3

## 2023-06-04 MED ORDER — ACETAMINOPHEN 325 MG PO TABS: 650.0000 mg | ORAL_TABLET | Freq: Four times a day (QID) | ORAL | Status: DC | PRN

## 2023-06-04 MED ORDER — POLYETHYLENE GLYCOL 3350 17 G PO PACK: 17.0000 g | PACK | Freq: Every day | ORAL | Status: DC | PRN

## 2023-06-04 MED ORDER — ONDANSETRON HCL 4 MG/2ML IJ SOLN: 4.0000 mg | Freq: Four times a day (QID) | INTRAMUSCULAR | Status: DC | PRN

## 2023-06-04 MED ORDER — ATORVASTATIN CALCIUM 20 MG PO TABS
20.0000 mg | ORAL_TABLET | Freq: Every day | ORAL | Status: DC
  Administered 2023-06-04 – 2023-06-12 (×9): 20 mg via ORAL
  Filled 2023-06-04 (×9): qty 1

## 2023-06-04 MED ORDER — SODIUM CHLORIDE 0.9 % IV SOLN
2.0000 g | Freq: Once | INTRAVENOUS | Status: AC
  Administered 2023-06-04: 2 g via INTRAVENOUS
  Filled 2023-06-04: qty 20

## 2023-06-04 MED ORDER — ALBUTEROL SULFATE (2.5 MG/3ML) 0.083% IN NEBU
2.5000 mg | INHALATION_SOLUTION | RESPIRATORY_TRACT | Status: DC | PRN
  Administered 2023-06-05 – 2023-06-11 (×2): 2.5 mg via RESPIRATORY_TRACT
  Filled 2023-06-04 (×2): qty 3

## 2023-06-04 MED ORDER — LEVOTHYROXINE SODIUM 125 MCG PO TABS
125.0000 ug | ORAL_TABLET | Freq: Every morning | ORAL | Status: DC
  Administered 2023-06-05 – 2023-06-12 (×8): 125 ug via ORAL
  Filled 2023-06-04 (×9): qty 1

## 2023-06-04 MED ORDER — SODIUM CHLORIDE 0.9 % IV SOLN
500.0000 mg | Freq: Once | INTRAVENOUS | Status: AC
  Administered 2023-06-04: 500 mg via INTRAVENOUS
  Filled 2023-06-04: qty 5

## 2023-06-04 MED ORDER — RIVAROXABAN 15 MG PO TABS
15.0000 mg | ORAL_TABLET | Freq: Every day | ORAL | Status: DC
  Administered 2023-06-04 – 2023-06-11 (×8): 15 mg via ORAL
  Filled 2023-06-04 (×8): qty 1

## 2023-06-04 MED ORDER — ONDANSETRON HCL 4 MG PO TABS
4.0000 mg | ORAL_TABLET | Freq: Four times a day (QID) | ORAL | Status: DC | PRN
  Filled 2023-06-04: qty 1

## 2023-06-04 MED ORDER — ALBUTEROL SULFATE (2.5 MG/3ML) 0.083% IN NEBU
2.5000 mg | INHALATION_SOLUTION | Freq: Once | RESPIRATORY_TRACT | Status: AC
  Administered 2023-06-04: 2.5 mg via RESPIRATORY_TRACT
  Filled 2023-06-04: qty 3

## 2023-06-04 MED ORDER — POTASSIUM CHLORIDE CRYS ER 20 MEQ PO TBCR
20.0000 meq | EXTENDED_RELEASE_TABLET | Freq: Every day | ORAL | Status: DC
  Administered 2023-06-04 – 2023-06-06 (×3): 20 meq via ORAL
  Filled 2023-06-04 (×4): qty 1

## 2023-06-04 MED ORDER — METOPROLOL SUCCINATE ER 25 MG PO TB24
12.5000 mg | ORAL_TABLET | Freq: Every day | ORAL | Status: DC
  Administered 2023-06-04 – 2023-06-12 (×9): 12.5 mg via ORAL
  Filled 2023-06-04 (×9): qty 1

## 2023-06-04 NOTE — H&P (Signed)
 [facility] H&P   Patient Demographics:    Diane Cohen, is a 78 y.o. female  MRN: [id number]   DOB - Mar 23, 1945  Admit Date - 06/04/2023  Outpatient Primary MD for the patient is Margo Aye, Kathleene Hazel, MD  Referring MD/NP/PA: Dr. Estell Harpin  Patient coming from: Home  Chief Complaint  Patient presents with   Shortness of Breath      HPI:    Diane Cohen  is a 78 y.o. female, with past medical history of atrial fibrillation, on Eliquis, chronic diastolic CHF, hypertension, TIA, hypothyroidism, hyperlipidemia, nocturnal hypoxemia on 2 L oxygen at nighttime only. -Patient presents to ED secondary to complaints of shortness of breath, and cough, reports symptoms ongoing for last 3 days, initially cough, generalized weakness and fatigue, patient took her pneumonia and RSV vaccine yesterday, patient report dyspnea over last 24 hours, cough and nasal congestion which prompted her to come to ED -In ED she was 82% on room air, she was dyspneic by EMS where she received steroids and nebulizer treatment, she is currently on 3 L oxygen, chest x-ray significant for multifocal pneumonia, [facility] hospitalist consulted to admit.    Review of systems:      A full 10 point Review of Systems was done, except as stated above, all other Review of Systems were negative.   With Past History of the following :    Past Medical History:  Diagnosis Date   Anxiety    Atrial fibrillation (HCC)    Atrial fibrillation ablation in 2019 - Dr. Johney Frame   Chronic diastolic heart failure (HCC)    Depression    Essential hypertension    History of TIA (transient ischemic attack)    Hyperlipidemia    Hypothyroidism    Pneumonia       Past Surgical History:  Procedure Laterality Date   ABDOMINAL HYSTERECTOMY     ABLATION OF DYSRHYTHMIC FOCUS  07/06/2017   ATRIAL FIBRILLATION ABLATION N/A 07/06/2017   Procedure: ATRIAL  FIBRILLATION ABLATION;  Surgeon: Hillis Range, MD;  Location: [facility];  Service: Cardiovascular;  Laterality: N/A;   BREAST SURGERY     biopsy   CARDIOVERSION N/A 01/23/2016   Procedure: CARDIOVERSION;  Surgeon: Jonelle Sidle, MD;  Location: [facility] ENDO SUITE;  Service: Cardiovascular;  Laterality: N/A;   CARDIOVERSION N/A 11/13/2016   Procedure: CARDIOVERSION;  Surgeon: Antoine Poche, MD;  Location: [facility] END[facility];  Service: Endoscopy;  Laterality: N/A;   CARDIOVERSION N/A 04/20/2018   Procedure: CARDIOVERSION;  Surgeon: Parke Poisson, MD;  Location: [facility] ENDOSCOPY;  Service: Cardiovascular;  Laterality: N/A;   CARDIOVERSION N/A 05/23/2020   Procedure: CARDIOVERSION;  Surgeon: Jonelle Sidle, MD;  Location: [facility];  Service: Cardiovascular;  Laterality: N/A;   CATARACT EXTRACTION W/PHACO Left 11/17/2021   Procedure: CATARACT EXTRACTION PHACO AND INTRAOCULAR LENS PLACEMENT (IOC);  Surgeon: Fabio Pierce, MD;  Location:  [facility];  Service: Ophthalmology;  Laterality: Left;  CDE:9.47   CATARACT EXTRACTION W/PHACO Right 12/01/2021   Procedure: CATARACT EXTRACTION PHACO AND INTRAOCULAR LENS PLACEMENT (IOC);  Surgeon: Fabio Pierce, MD;  Location: [facility] ORS;  Service: Ophthalmology;  Laterality: Right;  CDE: 6.91   CHOLECYSTECTOMY     COLONOSCOPY N/A 10/07/2018   Sigmoid and descending colon diverticulosis, three 6 to 15 mm polyps in rectum, ascending colon, and IC valve, one 6 mm polyp in cecum. (sessile serrated polyps and sessile serrated adenoma of ascending colon with focal high grade dysplasia.   coloscopy     ESOPHAGEAL DILATION  10/07/2018   Procedure: ESOPHAGEAL DILATION;  Surgeon: Corbin Ade, MD;  Location: [facility];  Service: Endoscopy;;   ESOPHAGOGASTRODUODENOSCOPY N/A 10/07/2018   normal esophagus s/p dilation   POLYPECTOMY  10/07/2018   Procedure: POLYPECTOMY;  Surgeon: Corbin Ade, MD;  Location: [facility];  Service: Endoscopy;;  Cecal polyps x 2  hot snare Ascending polyp x 1 Hot snare Rectal polyp x 1   TEE WITHOUT CARDIOVERSION  07/06/2017   TEE WITHOUT CARDIOVERSION N/A 07/06/2017   Procedure: TRANSESOPHAGEAL ECHOCARDIOGRAM (TEE);  Surgeon: Wendall Stade, MD;  Location: [facility] ENDOSCOPY;  Service: Cardiovascular;  Laterality: N/A;      Social History:     Social History   Tobacco Use   Smoking status: Former    Current packs/day: 0.00    Types: Cigarettes    Quit date: 01/22/1973    Years since quitting: 50.3   Smokeless tobacco: Never  Substance Use Topics   Alcohol use: Not Currently    Alcohol/week: 0.0 standard drinks of alcohol    Comment: 3 times a year: Christmas, Thanksgiving, and maybe just because        Family History :     Family History  Problem Relation Age of Onset   Hypertension Other    Hypertension Father    Hypertension Mother    Anemia Sister    Hypertension Sister    Colon cancer Neg Hx    Colon polyps Neg Hx     Home Medications:   Prior to Admission medications   Medication Sig Start Date End Date Taking? Authorizing Provider  levothyroxine (SYNTHROID) 137 MCG tablet Take 137 mcg by mouth daily. 05/31/23  Yes [provider]  acetaminophen (TYLENOL) 650 MG CR tablet Take 1,300 mg by mouth every 8 (eight) hours as needed for pain. Arthritis Strength    [provider]  amiodarone (PACERONE) 200 MG tablet TAKE 1 TABLET(200 MG) BY MOUTH DAILY 09/15/22   Jonelle Sidle, MD  atorvastatin (LIPITOR) 20 MG tablet Take 20 mg by mouth daily. 05/10/22   [provider]  dapagliflozin propanediol (FARXIGA) 10 MG TABS tablet Take 1 tablet (10 mg total) by mouth daily before breakfast. 07/08/22   Jonelle Sidle, MD  diltiazem (CARDIZEM CD) 180 MG 24 hr capsule TAKE 1 CAPSULE(180 MG) BY MOUTH DAILY 03/01/23   Jonelle Sidle, MD  diltiazem (CARDIZEM) 30 MG tablet Take 1 tablet (30 mg total) by mouth 4 (four) times daily as needed (for atrial fibrillation). 10/06/17    Allred, Fayrene Fearing, MD  DULoxetine (CYMBALTA) 60 MG capsule Take 1 capsule (60 mg total) by mouth daily. 06/24/19   Vassie Loll, MD  Iron-FA-B Cmp-C-Biot-Probiotic (FUSION PLUS) CAPS Take 1 capsule by mouth daily. 12/01/22   [provider]  levothyroxine (SYNTHROID) 125 MCG tablet Take 125 mcg by mouth every morning. 10/13/22  [provider]  lisinopril (ZESTRIL) 10 MG tablet TAKE 1 TABLET(10 MG) BY MOUTH DAILY 11/18/20   Iran Ouch, [place] M, PA-C  magnesium oxide (MAG-OX) 400 MG tablet Take 1 tablet (400 mg total) by mouth daily. 06/24/19   Vassie Loll, MD  metoprolol succinate (TOPROL XL) 25 MG 24 hr tablet Take 1 tablet (25 mg total) by mouth daily. 05/31/23   Jonelle Sidle, MD  metoprolol succinate (TOPROL-XL) 50 MG 24 hr tablet Take 25 mg by mouth daily. Take with or immediately following a meal.    [provider]  potassium chloride SA (KLOR-CON M) 20 MEQ tablet TAKE 1 TABLET(20 MEQ) BY MOUTH DAILY 01/06/23   Jonelle Sidle, MD  pregabalin (LYRICA) 75 MG capsule Take 1 capsule (75 mg total) by mouth at bedtime. For restless leg symptoms 04/26/23   Glenford Bayley, NP  Rivaroxaban (XARELTO) 15 MG TABS tablet TAKE 1 TABLET(15 MG) BY MOUTH DAILY WITH SUPPER 03/31/23   Jonelle Sidle, MD  torsemide (DEMADEX) 20 MG tablet Take 1 tablet (20 mg total) by mouth daily. 04/21/23   Jonelle Sidle, MD     Allergies:     Allergies  Allergen Reactions   Levaquin [Levofloxacin] Other (See Comments)    Headache    Sulfa Antibiotics Hives     Physical Exam:   Vitals  Blood pressure (!) 142/64, pulse 87, temperature 97.9 F (36.6 C), temperature source Oral, resp. rate (!) 24, height 5\' 1"  (1.549 m), weight 80.7 kg, SpO2 96%.   1. General Frail elderly female, laying in bed in no apparent distress  2. Normal affect and insight, Not Suicidal or Homicidal, Awake Alert, Oriented X 3.  3. No F.N deficits, ALL C.Nerves Intact, Strength 5/5 all 4  extremities, Sensation intact all 4 extremities, Plantars down going.  4. Ears and Eyes appear Normal, Conjunctivae clear, PERRLA. Moist Oral Mucosa.  5. Supple Neck, No JVD, No cervical lymphadenopathy appriciated, No Carotid Bruits.  6. Symmetrical Chest wall movement, Good air movement bilaterally, no wheezing  7. RRR, No Gallops, Rubs or Murmurs, No Parasternal Heave.  8. Positive Bowel Sounds, Abdomen Soft, No tenderness, No organomegaly appriciated,No rebound -guarding or rigidity.  9.  No Cyanosis, Normal Skin Turgor, No Skin Rash or Bruise.  10. Good muscle tone,  joints appear normal , no effusions, Normal ROM.     Data Review:    CBC Recent Labs  Lab 06/04/23 1823  WBC 7.7  HGB 12.8  HCT 40.1  PLT 186  MCV 101.8*  MCH 32.5  MCHC 31.9  RDW 15.9*  LYMPHSABS 0.7  MONOABS 0.3  EOSABS 0.0  BASOSABS 0.0   ------------------------------------------------------------------------------------------------------------------  Chemistries  Recent Labs  Lab 06/04/23 1823  NA 136  K 3.3*  CL 103  CO2 23  GLUCOSE 139*  BUN 16  CREATININE 0.93  CALCIUM 9.5  AST 49*  ALT 31  ALKPHOS 118  BILITOT 1.0   ------------------------------------------------------------------------------------------------------------------ estimated creatinine clearance is 48.8 mL/min (by C-G formula based on SCr of 0.93 mg/dL). ------------------------------------------------------------------------------------------------------------------ No results for input(s): "TSH", "T4TOTAL", "T3FREE", "THYROIDAB" in the last 72 hours.  Invalid input(s): "FREET3"  Coagulation profile No results for input(s): "INR", "PROTIME" in the last 168 hours. ------------------------------------------------------------------------------------------------------------------- No results for input(s): "DDIMER" in the last 72  hours. -------------------------------------------------------------------------------------------------------------------  Cardiac Enzymes No results for input(s): "CKMB", "TROPONINI", "MYOGLOBIN" in the last 168 hours.  Invalid input(s): "CK" ------------------------------------------------------------------------------------------------------------------    Component Value Date/Time   BNP 289.0 (H) 06/04/2023  1823     ---------------------------------------------------------------------------------------------------------------  Urinalysis    Component Value Date/Time   COLORURINE YELLOW 12/28/2013 1706   APPEARANCEUR CLEAR 12/28/2013 1706   LABSPEC <1.005 (L) 12/28/2013 1706   PHURINE 5.5 12/28/2013 1706   GLUCOSEU NEGATIVE 12/28/2013 1706   HGBUR NEGATIVE 12/28/2013 1706   BILIRUBINUR NEGATIVE 12/28/2013 1706   KETONESUR NEGATIVE 12/28/2013 1706   PROTEINUR NEGATIVE 12/28/2013 1706   UROBILINOGEN 0.2 12/28/2013 1706   NITRITE NEGATIVE 12/28/2013 1706   LEUKOCYTESUR NEGATIVE 12/28/2013 1706    ----------------------------------------------------------------------------------------------------------------   Imaging Results:    DG Chest Portable 1 View Result Date: 06/04/2023 CLINICAL DATA:  Cough, shortness of breath EXAM: PORTABLE CHEST 1 VIEW COMPARISON:  06/23/2019 FINDINGS: The heart size and mediastinal contours are within normal limits. Aortic atherosclerosis. Bilateral interstitial opacities, most pronounced within the right upper lobe and left lower lobe. No pleural effusion or pneumothorax. The visualized skeletal structures are unremarkable. IMPRESSION: Bilateral interstitial opacities, most pronounced within the right upper lobe and left lower lobe. Findings may reflect atypical/viral infection versus pulmonary edema. Electronically Signed   By: Duanne Guess D.O.   On: 06/04/2023 17:52     EKG:  Vent. rate 91 BPM PR interval 166 ms QRS duration 89  ms QT/QTcB 382/470 ms P-R-T axes 35 79 24 Sinus rhythm Atrial premature complex Probable anteroseptal infarct, recent Lateral leads are also involved   Assessment & Plan:    Principal Problem:   Pneumonia Active Problems:   Essential hypertension, benign   Hypothyroidism   Persistent atrial fibrillation   Chronic diastolic heart failure (HCC)   Atrial fibrillation with rapid ventricular response (HCC)    Acute on chronic respiratory failure with hypoxia Multifocal pneumonia -Patient presents with cough, congestion, requiring 2 L nasal cannula at nighttime only, she is currently requiring 3 L nasal cannula while awake, saturating 82% on room air while awake.  Chest x-ray significant for multifocal opacity. -Admitted under pneumonia pathway, follow blood cultures, sputum cultures, Legionella antigen, strep pneumonia antigen. -She was encouraged to use incentive spirometry and flutter valve -Continue with IV Rocephin and azithromycin. -Concern for final infection given her symptoms, will check respiratory panel including RSV, flu and COVID  Nocturnal hypoxemia -By reviewing pulmonary note, sleep study has been negative for ASA but significant for nocturnal hypoxemia, so started on oxygen for that  Periodic  limb movement -continue with Lyrica   Parox A-fib -continue with amiodarone and Cardizem for heart rate control -Continue with Eliquis for anticoagulation  Chronic diastolic CHF -BNP mildly elevated, but lower than baseline, x-ray findings more concerning for infection than volume overload, will hold torsemide for now -Continue with Farxiga  Hypokalemia -Replaced  Hypertension -Continue with home medications  Hypothyroidism -Continue with Synthroid  DVT Prophylaxis on Xarelto  AM Labs Ordered, also please review Full Orders  Family Communication: Admission, patients condition and plan of care including tests being ordered have been discussed with the patient  who indicate understanding and agree with the plan and Code Status.  Code Status full code  Likely DC to home home  Consults called: None  Admission status: Observation  Time spent in minutes : 70 minutes   Huey Bienenstock M.D on 06/04/2023 at 8:56 PM   [facility] - Office  [phone]

## 2023-06-04 NOTE — ED Triage Notes (Signed)
 Pt BIB [facility] from home with chest congestion, today SOB. O2 sats at 82 % on RA Albuterol neb tx, solumedrol 125 mg given en route.  IV left AC 20 G  + cough productive this morning-dark green

## 2023-06-04 NOTE — Progress Notes (Signed)
   06/04/23 2204  TOC Brief Assessment  Insurance and Status Reviewed  Patient has primary care physician Yes  Home environment has been reviewed From home.  Prior level of function: Independent.  Prior/Current Home Services No current home services  Social Drivers of Health Review SDOH reviewed no interventions necessary  Readmission risk has been reviewed Yes  Transition of care needs no transition of care needs at this time   Transition of Care Department [facility]) has reviewed patient and no other TOC needs have been identified at this time. We will continue to monitor patient advancement through interdisciplinary progression rounds. If new patient needs arise, please place a TOC consult.

## 2023-06-04 NOTE — Care Management Obs Status (Signed)
 MEDICARE OBSERVATION STATUS NOTIFICATION   Patient Details  Name: TEVA BRONKEMA MRN: [id number] Date of Birth: 1946-03-01   Medicare Observation Status Notification Given:  Yes    Barron Alvine, RN 06/04/2023, 9:56 PM

## 2023-06-04 NOTE — ED Provider Notes (Signed)
 [place] EMERGENCY DEPARTMENT AT [facility] Provider Note   CSN: [id number] Arrival date & time: 06/04/23  1648     History  Chief Complaint  Patient presents with   Shortness of Breath    Diane Cohen is a 78 y.o. female.  Patient with hypertension, A-fib, congestive heart failure.  She has been coughing up green stuff for couple days and complains of shortness of breath  The history is provided by the patient and medical records. No language interpreter was used.  Shortness of Breath Severity:  Moderate Onset quality:  Sudden Timing:  Constant Progression:  Worsening Chronicity:  New Context: not activity   Relieved by:  Nothing Worsened by:  Nothing Ineffective treatments:  None tried Associated symptoms: no abdominal pain, no chest pain, no cough, no headaches and no rash        Home Medications Prior to Admission medications   Medication Sig Start Date End Date Taking? Authorizing Provider  levothyroxine (SYNTHROID) 137 MCG tablet Take 137 mcg by mouth daily. 05/31/23  Yes [provider]  acetaminophen (TYLENOL) 650 MG CR tablet Take 1,300 mg by mouth every 8 (eight) hours as needed for pain. Arthritis Strength    [provider]  amiodarone (PACERONE) 200 MG tablet TAKE 1 TABLET(200 MG) BY MOUTH DAILY 09/15/22   Jonelle Sidle, MD  atorvastatin (LIPITOR) 20 MG tablet Take 20 mg by mouth daily. 05/10/22   [provider]  dapagliflozin propanediol (FARXIGA) 10 MG TABS tablet Take 1 tablet (10 mg total) by mouth daily before breakfast. 07/08/22   Jonelle Sidle, MD  diltiazem (CARDIZEM CD) 180 MG 24 hr capsule TAKE 1 CAPSULE(180 MG) BY MOUTH DAILY 03/01/23   Jonelle Sidle, MD  diltiazem (CARDIZEM) 30 MG tablet Take 1 tablet (30 mg total) by mouth 4 (four) times daily as needed (for atrial fibrillation). 10/06/17   Allred, Fayrene Fearing, MD  DULoxetine (CYMBALTA) 60 MG capsule Take 1 capsule (60 mg total) by mouth daily. 06/24/19    Vassie Loll, MD  Iron-FA-B Cmp-C-Biot-Probiotic (FUSION PLUS) CAPS Take 1 capsule by mouth daily. 12/01/22   [provider]  levothyroxine (SYNTHROID) 125 MCG tablet Take 125 mcg by mouth every morning. 10/13/22   [provider]  lisinopril (ZESTRIL) 10 MG tablet TAKE 1 TABLET(10 MG) BY MOUTH DAILY 11/18/20   Iran Ouch, [place] M, PA-C  magnesium oxide (MAG-OX) 400 MG tablet Take 1 tablet (400 mg total) by mouth daily. 06/24/19   Vassie Loll, MD  metoprolol succinate (TOPROL XL) 25 MG 24 hr tablet Take 1 tablet (25 mg total) by mouth daily. 05/31/23   Jonelle Sidle, MD  metoprolol succinate (TOPROL-XL) 50 MG 24 hr tablet Take 25 mg by mouth daily. Take with or immediately following a meal.    [provider]  potassium chloride SA (KLOR-CON M) 20 MEQ tablet TAKE 1 TABLET(20 MEQ) BY MOUTH DAILY 01/06/23   Jonelle Sidle, MD  pregabalin (LYRICA) 75 MG capsule Take 1 capsule (75 mg total) by mouth at bedtime. For restless leg symptoms 04/26/23   Glenford Bayley, NP  Rivaroxaban (XARELTO) 15 MG TABS tablet TAKE 1 TABLET(15 MG) BY MOUTH DAILY WITH SUPPER 03/31/23   Jonelle Sidle, MD  torsemide (DEMADEX) 20 MG tablet Take 1 tablet (20 mg total) by mouth daily. 04/21/23   Jonelle Sidle, MD      Allergies    Levaquin [levofloxacin] and Sulfa antibiotics    Review of Systems  Review of Systems  Constitutional:  Negative for appetite change and fatigue.  HENT:  Negative for congestion, ear discharge and sinus pressure.   Eyes:  Negative for discharge.  Respiratory:  Positive for shortness of breath. Negative for cough.   Cardiovascular:  Negative for chest pain.  Gastrointestinal:  Negative for abdominal pain and diarrhea.  Genitourinary:  Negative for frequency and hematuria.  Musculoskeletal:  Negative for back pain.  Skin:  Negative for rash.  Neurological:  Negative for seizures and headaches.  Psychiatric/Behavioral:  Negative for hallucinations.      Physical Exam Updated Vital Signs BP (!) 142/64   Pulse 87   Temp 97.9 F (36.6 C) (Oral)   Resp (!) 24   Ht 5\' 1"  (1.549 m)   Wt 80.7 kg   SpO2 96%   BMI 33.63 kg/m  Physical Exam Vitals and nursing note reviewed.  Constitutional:      Appearance: She is well-developed.  HENT:     Head: Normocephalic.     Nose: Nose normal.  Eyes:     General: No scleral icterus.    Conjunctiva/sclera: Conjunctivae normal.  Neck:     Thyroid: No thyromegaly.  Cardiovascular:     Rate and Rhythm: Normal rate and regular rhythm.     Heart sounds: No murmur heard.    No friction rub. No gallop.  Pulmonary:     Breath sounds: No stridor. No wheezing or rales.  Chest:     Chest wall: No tenderness.  Abdominal:     General: There is no distension.     Tenderness: There is no abdominal tenderness. There is no rebound.  Musculoskeletal:        General: Normal range of motion.     Cervical back: Neck supple.  Lymphadenopathy:     Cervical: No cervical adenopathy.  Skin:    Findings: No erythema or rash.  Neurological:     Mental Status: She is alert and oriented to person, place, and time.     Motor: No abnormal muscle tone.     Coordination: Coordination normal.  Psychiatric:        Behavior: Behavior normal.     ED Results / Procedures / Treatments   Labs (all labs ordered are listed, but only abnormal results are displayed) Labs Reviewed  CBC WITH DIFFERENTIAL/PLATELET - Abnormal; Notable for the following components:      Result Value   MCV 101.8 (*)    RDW 15.9 (*)    All other components within normal limits  COMPREHENSIVE METABOLIC PANEL WITH GFR - Abnormal; Notable for the following components:   Potassium 3.3 (*)    Glucose, Bld 139 (*)    AST 49 (*)    All other components within normal limits  BRAIN NATRIURETIC PEPTIDE - Abnormal; Notable for the following components:   B Natriuretic Peptide 289.0 (*)    All other components within normal limits  RESP  PANEL BY RT-PCR (RSV, FLU A&B, COVID)  [id number]    EKG EKG Interpretation Date/Time:  Friday June 04 2023 16:56:25 EDT Ventricular Rate:  91 PR Interval:  166 QRS Duration:  89 QT Interval:  382 QTC Calculation: 470 R Axis:   79  Text Interpretation: Sinus rhythm Atrial premature complex Probable anteroseptal infarct, recent Lateral leads are also involved Confirmed by Bethann Berkshire [phone]) on 06/04/2023 5:10:57 PM  Radiology DG Chest Portable 1 View Result Date: 06/04/2023 CLINICAL DATA:  Cough, shortness of breath EXAM: PORTABLE CHEST 1 VIEW  COMPARISON:  06/23/2019 FINDINGS: The heart size and mediastinal contours are within normal limits. Aortic atherosclerosis. Bilateral interstitial opacities, most pronounced within the right upper lobe and left lower lobe. No pleural effusion or pneumothorax. The visualized skeletal structures are unremarkable. IMPRESSION: Bilateral interstitial opacities, most pronounced within the right upper lobe and left lower lobe. Findings may reflect atypical/viral infection versus pulmonary edema. Electronically Signed   By: Duanne Guess D.O.   On: 06/04/2023 17:52    Procedures Procedures    Medications Ordered in ED Medications  cefTRIAXone (ROCEPHIN) 2 g in sodium chloride 0.9 % 100 mL IVPB (has no administration in time range)  azithromycin (ZITHROMAX) 500 mg in sodium chloride 0.9 % 250 mL IVPB (has no administration in time range)  ipratropium-albuterol (DUONEB) 0.5-2.5 (3) MG/3ML nebulizer solution 3 mL (3 mLs Nebulization Given 06/04/23 1744)  albuterol (PROVENTIL) (2.5 MG/3ML) 0.083% nebulizer solution 2.5 mg (2.5 mg Nebulization Given 06/04/23 1744)    ED Course/ Medical Decision Making/ A&P                                 Medical Decision Making Amount and/or Complexity of Data Reviewed Labs: ordered. Radiology: ordered.  Risk Prescription drug management. Decision regarding hospitalization.   Patient with community-acquired  pneumonia.  She is started on antibiotics and will be admitted to medicine        Final Clinical Impression(s) / ED Diagnoses Final diagnoses:  Community acquired pneumonia of right upper lobe of lung    Rx / DC Orders ED Discharge Orders     None         Bethann Berkshire, MD 06/06/23 1233

## 2023-06-05 DIAGNOSIS — J189 Pneumonia, unspecified organism: Secondary | ICD-10-CM | POA: Diagnosis present

## 2023-06-05 DIAGNOSIS — I4891 Unspecified atrial fibrillation: Secondary | ICD-10-CM | POA: Diagnosis not present

## 2023-06-05 DIAGNOSIS — E869 Volume depletion, unspecified: Secondary | ICD-10-CM | POA: Diagnosis present

## 2023-06-05 DIAGNOSIS — Z87891 Personal history of nicotine dependence: Secondary | ICD-10-CM | POA: Diagnosis not present

## 2023-06-05 DIAGNOSIS — I130 Hypertensive heart and chronic kidney disease with heart failure and stage 1 through stage 4 chronic kidney disease, or unspecified chronic kidney disease: Secondary | ICD-10-CM | POA: Diagnosis present

## 2023-06-05 DIAGNOSIS — G4761 Periodic limb movement disorder: Secondary | ICD-10-CM | POA: Diagnosis present

## 2023-06-05 DIAGNOSIS — J181 Lobar pneumonia, unspecified organism: Secondary | ICD-10-CM | POA: Diagnosis present

## 2023-06-05 DIAGNOSIS — Z1152 Encounter for screening for COVID-19: Secondary | ICD-10-CM | POA: Diagnosis not present

## 2023-06-05 DIAGNOSIS — J441 Chronic obstructive pulmonary disease with (acute) exacerbation: Secondary | ICD-10-CM | POA: Diagnosis not present

## 2023-06-05 DIAGNOSIS — I10 Essential (primary) hypertension: Secondary | ICD-10-CM | POA: Diagnosis not present

## 2023-06-05 DIAGNOSIS — Z8673 Personal history of transient ischemic attack (TIA), and cerebral infarction without residual deficits: Secondary | ICD-10-CM | POA: Diagnosis not present

## 2023-06-05 DIAGNOSIS — E039 Hypothyroidism, unspecified: Secondary | ICD-10-CM | POA: Diagnosis present

## 2023-06-05 DIAGNOSIS — I4819 Other persistent atrial fibrillation: Secondary | ICD-10-CM | POA: Diagnosis present

## 2023-06-05 DIAGNOSIS — N170 Acute kidney failure with tubular necrosis: Secondary | ICD-10-CM | POA: Diagnosis not present

## 2023-06-05 DIAGNOSIS — I5032 Chronic diastolic (congestive) heart failure: Secondary | ICD-10-CM | POA: Diagnosis present

## 2023-06-05 DIAGNOSIS — Z90710 Acquired absence of both cervix and uterus: Secondary | ICD-10-CM | POA: Diagnosis not present

## 2023-06-05 DIAGNOSIS — Z79890 Hormone replacement therapy (postmenopausal): Secondary | ICD-10-CM | POA: Diagnosis not present

## 2023-06-05 DIAGNOSIS — G2581 Restless legs syndrome: Secondary | ICD-10-CM | POA: Diagnosis present

## 2023-06-05 DIAGNOSIS — J9621 Acute and chronic respiratory failure with hypoxia: Secondary | ICD-10-CM | POA: Diagnosis present

## 2023-06-05 DIAGNOSIS — Z882 Allergy status to sulfonamides status: Secondary | ICD-10-CM | POA: Diagnosis not present

## 2023-06-05 DIAGNOSIS — E785 Hyperlipidemia, unspecified: Secondary | ICD-10-CM | POA: Diagnosis present

## 2023-06-05 DIAGNOSIS — N1831 Chronic kidney disease, stage 3a: Secondary | ICD-10-CM | POA: Diagnosis present

## 2023-06-05 DIAGNOSIS — Z881 Allergy status to other antibiotic agents status: Secondary | ICD-10-CM | POA: Diagnosis not present

## 2023-06-05 DIAGNOSIS — Z8249 Family history of ischemic heart disease and other diseases of the circulatory system: Secondary | ICD-10-CM | POA: Diagnosis not present

## 2023-06-05 DIAGNOSIS — R918 Other nonspecific abnormal finding of lung field: Secondary | ICD-10-CM | POA: Diagnosis not present

## 2023-06-05 DIAGNOSIS — Z79899 Other long term (current) drug therapy: Secondary | ICD-10-CM | POA: Diagnosis not present

## 2023-06-05 DIAGNOSIS — E876 Hypokalemia: Secondary | ICD-10-CM | POA: Diagnosis present

## 2023-06-05 DIAGNOSIS — Z7901 Long term (current) use of anticoagulants: Secondary | ICD-10-CM | POA: Diagnosis not present

## 2023-06-05 LAB — BASIC METABOLIC PANEL WITH GFR
Anion gap: 9 (ref 5–15)
BUN: 13 mg/dL (ref 8–23)
CO2: 23 mmol/L (ref 22–32)
Calcium: 9.8 mg/dL (ref 8.9–10.3)
Chloride: 107 mmol/L (ref 98–111)
Creatinine, Ser: 0.93 mg/dL (ref 0.44–1.00)
GFR, Estimated: >60 mL/min (ref >60)
Glucose, Bld: 150 mg/dL — ABNORMAL HIGH (ref 70–99)
Potassium: 3.9 mmol/L (ref 3.5–5.1)
Sodium: 139 mmol/L (ref 135–145)

## 2023-06-05 LAB — CBC
HCT: 38.4 % (ref 36.0–46.0)
Hemoglobin: 12.3 g/dL (ref 12.0–15.0)
MCH: 32.5 pg (ref 26.0–34.0)
MCHC: 32 g/dL (ref 30.0–36.0)
MCV: 101.3 fL — ABNORMAL HIGH (ref 80.0–100.0)
Platelets: 208 10*3/uL (ref 150–400)
RBC: 3.79 MIL/uL — ABNORMAL LOW (ref 3.87–5.11)
RDW: 16.1 % — ABNORMAL HIGH (ref 11.5–15.5)
WBC: 6.7 10*3/uL (ref 4.0–10.5)
nRBC: 0 % (ref 0.0–0.2)

## 2023-06-05 LAB — STREP PNEUMONIAE URINARY ANTIGEN: Strep Pneumo Urinary Antigen: NEGATIVE

## 2023-06-05 MED ORDER — GUAIFENESIN ER 600 MG PO TB12: 1200.0000 mg | ORAL_TABLET | Freq: Two times a day (BID) | ORAL | Status: DC

## 2023-06-05 MED ORDER — GUAIFENESIN ER 600 MG PO TB12
600.0000 mg | ORAL_TABLET | Freq: Two times a day (BID) | ORAL | Status: AC
  Administered 2023-06-05 – 2023-06-09 (×10): 600 mg via ORAL
  Filled 2023-06-05 (×10): qty 1

## 2023-06-05 MED ORDER — TORSEMIDE 20 MG PO TABS
20.0000 mg | ORAL_TABLET | Freq: Every day | ORAL | Status: DC
  Administered 2023-06-05 – 2023-06-10 (×6): 20 mg via ORAL
  Filled 2023-06-05 (×6): qty 1

## 2023-06-05 MED ORDER — IPRATROPIUM-ALBUTEROL 0.5-2.5 (3) MG/3ML IN SOLN
3.0000 mL | RESPIRATORY_TRACT | Status: DC
  Administered 2023-06-05 – 2023-06-09 (×23): 3 mL via RESPIRATORY_TRACT
  Filled 2023-06-05 (×23): qty 3

## 2023-06-05 MED ORDER — PREDNISONE 20 MG PO TABS
40.0000 mg | ORAL_TABLET | Freq: Every day | ORAL | Status: DC
  Administered 2023-06-05 – 2023-06-07 (×3): 40 mg via ORAL
  Filled 2023-06-05 (×3): qty 2

## 2023-06-05 MED ORDER — LISINOPRIL 10 MG PO TABS
10.0000 mg | ORAL_TABLET | Freq: Every day | ORAL | Status: DC
  Administered 2023-06-06 – 2023-06-10 (×5): 10 mg via ORAL
  Filled 2023-06-05 (×6): qty 1

## 2023-06-05 MED ORDER — PREGABALIN 75 MG PO CAPS
75.0000 mg | ORAL_CAPSULE | Freq: Every day | ORAL | Status: DC
  Administered 2023-06-05 – 2023-06-11 (×7): 75 mg via ORAL
  Filled 2023-06-05 (×7): qty 1

## 2023-06-05 NOTE — Evaluation (Signed)
 Physical Therapy Evaluation Patient Details Name: Diane Cohen MRN: [id number] DOB: June 30, 1945 Today's Date: 06/05/2023  History of Present Illness  Diane Cohen  is a 78 y.o. female, with past medical history of atrial fibrillation, on Eliquis, chronic diastolic CHF, hypertension, TIA, hypothyroidism, hyperlipidemia, nocturnal hypoxemia on 2 L oxygen at nighttime only.  -Patient presents to ED secondary to complaints of shortness of breath, and cough, reports symptoms ongoing for last 3 days, initially cough, generalized weakness and fatigue, patient took her pneumonia and RSV vaccine yesterday, patient report dyspnea over last 24 hours, cough and nasal congestion which prompted her to come to ED  -In ED she was 82% on room air, she was dyspneic by EMS where she received steroids and nebulizer treatment, she is currently on 3 L oxygen, chest x-ray significant for multifocal pneumonia, [facility] hospitalist consulted to admit.   Clinical Impression  Patient demonstrates decreased LE strength, decreased balance, and increased time required for functional transfers and ambulation. Patient also demonstrates SOB following gait training, bed mobility, and functional transfers during today's session. Patient able to initiate gait training in room for 3 bouts with CGA with no LOB, however, significant increase in respiratory rate noted, O2 saturation remained within normal values. Patient would continue to benefit from skilled physical therapy for increased endurance with ambulation, increased LE strength, and balance for improved quality of life, improved independence with gait training and continued progress towards therapy goals.         If plan is discharge home, recommend the following: A little help with walking and/or transfers;A little help with bathing/dressing/bathroom;Assistance with cooking/housework;Help with stairs or ramp for entrance;Assist for transportation   Can travel by private vehicle         Equipment Recommendations None recommended by PT  Recommendations for Other Services       Functional Status Assessment Patient has had a recent decline in their functional status and demonstrates the ability to make significant improvements in function in a reasonable and predictable amount of time.     Precautions / Restrictions Precautions Precautions: Fall;Other (comment) (O2 3Lpm today) Recall of Precautions/Restrictions: Intact Restrictions Weight Bearing Restrictions Per Provider Order: No      Mobility  Bed Mobility Overal bed mobility: Modified Independent             General bed mobility comments: increased time required, slow movement, becomes SOB with supine to sitting    Transfers Overall transfer level: Modified independent Equipment used: None               General transfer comment: CGA, increased time, SOB    Ambulation/Gait Ambulation/Gait assistance: Supervision, Contact guard assist Gait Distance (Feet): 45 Feet Assistive device: None Gait Pattern/deviations: WFL(Within Functional Limits), Step-through pattern, Decreased step length - right, Decreased step length - left, Decreased stride length, Wide base of support Gait velocity: decreased     General Gait Details: decreased speed, very cautious about not falling, pt was surprised she was able to walk for multiple bouts today however did not want to leave the room due to SOB  Stairs            Wheelchair Mobility     Tilt Bed    Modified Rankin (Stroke Patients Only)       Balance Overall balance assessment: Modified Independent  Pertinent Vitals/Pain Pain Assessment Pain Assessment: No/denies pain    Home Living Family/patient expects to be discharged to:: Private residence Living Arrangements: Other (Comment) (Grandson and his young family) Available Help at Discharge: Family Type of Home: House Home  Access: Stairs to enter Entrance Stairs-Rails: Right Entrance Stairs-Number of Steps: 4, wheel chair ramp available   Home Layout: One level Home Equipment: Grab bars - toilet;Grab bars - tub/shower;BSC/3in1;Rolling Walker (2 wheels);Cane - single point;Wheelchair - manual;Shower seat      Prior Function Prior Level of Function : Independent/Modified Independent             Mobility Comments: drives, community ambulator ADLs Comments: independent, occassional help for heavier chores     Extremity/Trunk Assessment   Upper Extremity Assessment Upper Extremity Assessment: Overall WFL for tasks assessed    Lower Extremity Assessment Lower Extremity Assessment: Generalized weakness;Overall [facility] for tasks assessed    Cervical / Trunk Assessment Cervical / Trunk Assessment: Kyphotic  Communication   Communication Communication: No apparent difficulties    Cognition Arousal: Alert Behavior During Therapy: WFL for tasks assessed/performed   PT - Cognitive impairments: No apparent impairments                         Following commands: Intact       Cueing Cueing Techniques: Verbal cues, Visual cues     General Comments      Exercises     Assessment/Plan    PT Assessment Patient needs continued PT services  PT Problem List Decreased strength;Decreased activity tolerance;Decreased balance;Decreased mobility;Cardiopulmonary status limiting activity       PT Treatment Interventions Gait training;Stair training;Functional mobility training;Therapeutic activities;Balance training;Therapeutic exercise;Patient/family education;Neuromuscular re-education    PT Goals (Current goals can be found in the Care Plan section)  Acute Rehab PT Goals Patient Stated Goal: to regain strength PT Goal Formulation: With patient Time For Goal Achievement: 06/19/23 Potential to Achieve Goals: Good    Frequency Min 3X/week     Co-evaluation               AM-PAC  PT "6 Clicks" Mobility  Outcome Measure Help needed turning from your back to your side while in a flat bed without using bedrails?: None Help needed moving from lying on your back to sitting on the side of a flat bed without using bedrails?: A Little Help needed moving to and from a bed to a chair (including a wheelchair)?: None Help needed standing up from a chair using your arms (e.g., wheelchair or bedside chair)?: None Help needed to walk in hospital room?: A Little Help needed climbing 3-5 steps with a railing? : A Lot 6 Click Score: 20    End of Session   Activity Tolerance: Patient tolerated treatment well;Patient limited by fatigue Patient left: in chair;with call bell/phone within reach Nurse Communication: Mobility status PT Visit Diagnosis: Other abnormalities of gait and mobility (R26.89);History of falling (Z91.81);Muscle weakness (generalized) (M62.81)    Time: 0929-1000 PT Time Calculation (min) (ACUTE ONLY): 31 min   Charges:   PT Evaluation $PT Eval Low Complexity: 1 Low PT Treatments $Therapeutic Activity: 8-22 mins PT General Charges $$ ACUTE PT VISIT: 1 Visit         Luz Lex, PT, DPT [facility] Office: [phone] 10:58 AM, 06/05/23,m

## 2023-06-05 NOTE — Plan of Care (Signed)
  Problem: Acute Rehab PT Goals(only PT should resolve) Goal: Pt Will Go Supine/Side To Sit Outcome: Progressing Flowsheets (Taken 06/05/2023 1059) Pt will go Supine/Side to Sit: Independently Goal: Patient Will Transfer Sit To/From Stand Outcome: Progressing Flowsheets (Taken 06/05/2023 1059) Patient will transfer sit to/from stand: Independently Goal: Pt Will Transfer Bed To Chair/Chair To Bed Outcome: Progressing Flowsheets (Taken 06/05/2023 1059) Pt will Transfer Bed to Chair/Chair to Bed: Independently Goal: Pt Will Ambulate Outcome: Progressing Flowsheets (Taken 06/05/2023 1059) Pt will Ambulate: (with cues for pacing strategies)  100 feet  with modified independence  with supervision  with cues (comment type and amount)   Luz Lex, PT, DPT [facility] Office: [phone] 11:00 AM, 06/05/23

## 2023-06-05 NOTE — Progress Notes (Signed)
 PROGRESS NOTE   Diane Cohen  WUJ:[id number] DOB: 04-Jul-1945 DOA: 06/04/2023 PCP: Benita Stabile, MD   Chief Complaint  Patient presents with   Shortness of Breath   Level of care: Telemetry  Brief Admission History:  78 y.o. female, with past medical history of atrial fibrillation, on Eliquis, chronic diastolic CHF, hypertension, TIA, hypothyroidism, hyperlipidemia, nocturnal hypoxemia on 2 L oxygen at nighttime only.  Patient presents to ED secondary to complaints of shortness of breath, and cough, reports symptoms ongoing for last 3 days, initially cough, generalized weakness and fatigue, patient took her pneumonia and RSV vaccine yesterday, patient report dyspnea over last 24 hours, cough and nasal congestion which prompted her to come to ED -In ED she was 82% on room air, she was dyspneic by EMS where she received steroids and nebulizer treatments, on 3 L oxygen, chest x-ray significant for multifocal pneumonia, [facility] hospitalist consulted to admit.   Assessment and Plan:  Acute on chronic respiratory failure with hypoxia Multifocal pneumonia -Patient presented with cough, congestion, requiring 2 L nasal cannula at nighttime only, she is currently requiring 3 L nasal cannula while awake, saturating 82% on room air while awake.  Chest x-ray significant for multifocal opacity. -Admitted under pneumonia pathway, follow blood cultures, sputum cultures, Legionella antigen, strep pneumonia antigen. -She was encouraged to use incentive spirometry and flutter valve -Continue with IV Rocephin and azithromycin. -pt continues to cough and wheeze, added scheduled bronchodilators treatments  -reassess tomorrow    Nocturnal hypoxemia -By reviewing pulmonary note, sleep study has been negative for OSA but significant for nocturnal hypoxemia, so started on oxygen for that   Periodic  limb movement -continue with Lyrica    Parox A-fib -continue with amiodarone and Cardizem for heart rate  control -Continue with Eliquis for anticoagulation   Chronic diastolic CHF -BNP mildly elevated, resumed home torsemide  -Continue with Farxiga   Hypokalemia -Replaced   Hypertension -Continue with home medications   Hypothyroidism -Continue with Synthroid  DVT prophylaxis: rivaroxaban Code Status: Full  Family Communication:  Disposition:    Consultants:   Procedures:   Antimicrobials:  Ceftriaxone 3/28>> Azithromycin 3/28>>   Subjective: Pt reports cough, wheezing and chest congestion Objective: Vitals:   06/05/23 0337 06/05/23 0807 06/05/23 0822 06/05/23 0857  BP: (!) 140/81   (!) 147/69  Pulse: (!) 106   85  Resp: 20     Temp: 98.1 F (36.7 C)     TempSrc: Oral     SpO2: 98% 95% 95% 100%  Weight:      Height:       No intake or output data in the 24 hours ending 06/05/23 1010 Filed Weights   06/04/23 1658  Weight: 80.7 kg   Examination:  General exam: Appears calm and comfortable  Respiratory system: bilateral expiratory wheezing heard, moderate to severe increased work of breathing, rales heard RLL.  Cardiovascular system: normal S1 & S2 heard. No JVD, murmurs, rubs, gallops or clicks. No pedal edema. Gastrointestinal system: Abdomen is nondistended, soft and nontender. No organomegaly or masses felt. Normal bowel sounds heard. Central nervous system: Alert and oriented. No focal neurological deficits. Extremities: Symmetric 5 x 5 power. Skin: No rashes, lesions or ulcers. Psychiatry: Judgement and insight appear normal. Mood & affect appropriate.   Data Reviewed: I have personally reviewed following labs and imaging studies  CBC: Recent Labs  Lab 06/04/23 1823 06/05/23 0351  WBC 7.7 6.7  NEUTROABS 6.7  --   HGB 12.8 12.3  HCT 40.1 38.4  MCV 101.8* 101.3*  PLT 186 208    Basic Metabolic Panel: Recent Labs  Lab 06/04/23 1823 06/05/23 0351  NA 136 139  K 3.3* 3.9  CL 103 107  CO2 23 23  GLUCOSE 139* 150*  BUN 16 13  CREATININE  0.93 0.93  CALCIUM 9.5 9.8    CBG: No results for input(s): "GLUCAP" in the last 168 hours.  Recent Results (from the past 240 hours)  Resp panel by RT-PCR (RSV, Flu A&B, Covid) Anterior Nasal Swab     Status: None   Collection Time: 06/04/23  9:01 PM   Specimen: Anterior Nasal Swab  Result Value Ref Range Status   SARS Coronavirus 2 by RT PCR NEGATIVE NEGATIVE Final    Comment: (NOTE) SARS-CoV-2 target nucleic acids are NOT DETECTED.  The SARS-CoV-2 RNA is generally detectable in upper respiratory specimens during the acute phase of infection. The lowest concentration of SARS-CoV-2 viral copies this assay can detect is 138 copies/mL. A negative result does not preclude SARS-Cov-2 infection and should not be used as the sole basis for treatment or other patient management decisions. A negative result may occur with  improper specimen collection/handling, submission of specimen other than nasopharyngeal swab, presence of viral mutation(s) within the areas targeted by this assay, and inadequate number of viral copies(<138 copies/mL). A negative result must be combined with clinical observations, patient history, and epidemiological information. The expected result is Negative.  Fact Sheet for Patients:  BloggerCourse.com  Fact Sheet for Healthcare Providers:  SeriousBroker.it  This test is no t yet approved or cleared by the [place] FDA and  has been authorized for detection and/or diagnosis of SARS-CoV-2 by FDA under an Emergency Use Authorization (EUA). This EUA will remain  in effect (meaning this test can be used) for the duration of the COVID-19 declaration under Section 564(b)(1) of the Act, 21 U.S.C.section 360bbb-3(b)(1), unless the authorization is terminated  or revoked sooner.       Influenza A by PCR NEGATIVE NEGATIVE Final   Influenza B by PCR NEGATIVE NEGATIVE Final    Comment: (NOTE) The Xpert Xpress  SARS-CoV-2/FLU/RSV plus assay is intended as an aid in the diagnosis of influenza from Nasopharyngeal swab specimens and should not be used as a sole basis for treatment. Nasal washings and aspirates are unacceptable for Xpert Xpress SARS-CoV-2/FLU/RSV testing.  Fact Sheet for Patients: BloggerCourse.com  Fact Sheet for Healthcare Providers: SeriousBroker.it  This test is not yet approved or cleared by the [place] FDA and has been authorized for detection and/or diagnosis of SARS-CoV-2 by FDA under an Emergency Use Authorization (EUA). This EUA will remain in effect (meaning this test can be used) for the duration of the COVID-19 declaration under Section 564(b)(1) of the Act, 21 U.S.C. section 360bbb-3(b)(1), unless the authorization is terminated or revoked.     Resp Syncytial Virus by PCR NEGATIVE NEGATIVE Final    Comment: (NOTE) Fact Sheet for Patients: BloggerCourse.com  Fact Sheet for Healthcare Providers: SeriousBroker.it  This test is not yet approved or cleared by the [place] FDA and has been authorized for detection and/or diagnosis of SARS-CoV-2 by FDA under an Emergency Use Authorization (EUA). This EUA will remain in effect (meaning this test can be used) for the duration of the COVID-19 declaration under Section 564(b)(1) of the Act, 21 U.S.C. section 360bbb-3(b)(1), unless the authorization is terminated or revoked.  Performed at [facility], [address], [place], [place] [address]   Culture,  blood (routine x 2) Call MD if unable to obtain prior to antibiotics being given     Status: None (Preliminary result)   Collection Time: 06/04/23  9:30 PM   Specimen: BLOOD  Result Value Ref Range Status   Specimen Description BLOOD BLOOD RIGHT HAND  Final   Special Requests BOTTLES DRAWN AEROBIC AND ANAEROBIC  Final   Culture   Final    NO GROWTH < 12  HOURS Performed at [facility], [address] [place], [place] [address]    Report Status PENDING  Incomplete  Culture, blood (Routine X 2) w Reflex to ID Panel     Status: None (Preliminary result)   Collection Time: 06/04/23  9:40 PM   Specimen: BLOOD  Result Value Ref Range Status   Specimen Description BLOOD BLOOD RIGHT HAND  Final   Special Requests BOTTLES DRAWN AEROBIC AND ANAEROBIC  Final   Culture   Final    NO GROWTH < 12 HOURS Performed at [facility], [address], [place], [place] [address]    Report Status PENDING  Incomplete     Radiology Studies: DG Chest Portable 1 View Result Date: 06/04/2023 CLINICAL DATA:  Cough, shortness of breath EXAM: PORTABLE CHEST 1 VIEW COMPARISON:  06/23/2019 FINDINGS: The heart size and mediastinal contours are within normal limits. Aortic atherosclerosis. Bilateral interstitial opacities, most pronounced within the right upper lobe and left lower lobe. No pleural effusion or pneumothorax. The visualized skeletal structures are unremarkable. IMPRESSION: Bilateral interstitial opacities, most pronounced within the right upper lobe and left lower lobe. Findings may reflect atypical/viral infection versus pulmonary edema. Electronically Signed   By: Duanne Guess D.O.   On: 06/04/2023 17:52    Scheduled Meds:  amiodarone  200 mg Oral Daily   atorvastatin  20 mg Oral Daily   dapagliflozin propanediol  10 mg Oral QAC breakfast   diltiazem  180 mg Oral Daily   DULoxetine  60 mg Oral Daily   guaiFENesin  600 mg Oral BID   ipratropium-albuterol  3 mL Nebulization Q4H   levothyroxine  125 mcg Oral q morning   metoprolol succinate  12.5 mg Oral Daily   potassium chloride SA  20 mEq Oral Daily   Rivaroxaban  15 mg Oral Q supper   Continuous Infusions:  azithromycin     cefTRIAXone (ROCEPHIN)  IV       LOS: 0 days   Time spent: 57 mins  Norlan Rann Laural Benes, MD How to contact the [facility] Attending or Consulting provider 7A - 7P or covering  provider during after hours 7P -7A, for this patient?  Check the care team in [facility] and look for a) attending/consulting [facility] provider listed and b) the [facility] team listed Log into [email or web address] to find provider on call.  Locate the [facility] provider you are looking for under [facility] and page to a number that you can be directly reached. If you still have difficulty reaching the provider, please page the [facility] (Director on Call) for the Hospitalists listed on amion for assistance.  06/05/2023, 10:10 AM

## 2023-06-05 NOTE — Hospital Course (Signed)
 78 y.o. female, with past medical history of atrial fibrillation, on Eliquis, chronic diastolic CHF, hypertension, TIA, hypothyroidism, hyperlipidemia, nocturnal hypoxemia on 2 L oxygen at nighttime only.  Patient presents to ED secondary to complaints of shortness of breath, and cough, reports symptoms ongoing for last 3 days, initially cough, generalized weakness and fatigue, patient took her pneumonia and RSV vaccine yesterday, patient report dyspnea over last 24 hours, cough and nasal congestion which prompted her to come to ED -In ED she was 82% on room air, she was dyspneic by EMS where she received steroids and nebulizer treatments, on 3 L oxygen, chest x-ray significant for multifocal pneumonia, [facility] hospitalist consulted to admit.

## 2023-06-06 DIAGNOSIS — I5032 Chronic diastolic (congestive) heart failure: Secondary | ICD-10-CM | POA: Diagnosis not present

## 2023-06-06 DIAGNOSIS — I10 Essential (primary) hypertension: Secondary | ICD-10-CM | POA: Diagnosis not present

## 2023-06-06 DIAGNOSIS — E039 Hypothyroidism, unspecified: Secondary | ICD-10-CM | POA: Diagnosis not present

## 2023-06-06 DIAGNOSIS — J189 Pneumonia, unspecified organism: Secondary | ICD-10-CM | POA: Diagnosis not present

## 2023-06-06 LAB — BASIC METABOLIC PANEL WITH GFR
Anion gap: 11 (ref 5–15)
BUN: 20 mg/dL (ref 8–23)
CO2: 23 mmol/L (ref 22–32)
Calcium: 9.7 mg/dL (ref 8.9–10.3)
Chloride: 105 mmol/L (ref 98–111)
Creatinine, Ser: 0.97 mg/dL (ref 0.44–1.00)
GFR, Estimated: >60 mL/min (ref >60)
Glucose, Bld: 107 mg/dL — ABNORMAL HIGH (ref 70–99)
Potassium: 3.4 mmol/L — ABNORMAL LOW (ref 3.5–5.1)
Sodium: 139 mmol/L (ref 135–145)

## 2023-06-06 LAB — MAGNESIUM: Magnesium: 2.2 mg/dL (ref 1.7–2.4)

## 2023-06-06 MED ORDER — DEXTROMETHORPHAN POLISTIREX ER 30 MG/5ML PO SUER
60.0000 mg | Freq: Two times a day (BID) | ORAL | Status: DC | PRN
  Administered 2023-06-06 – 2023-06-08 (×4): 60 mg via ORAL
  Filled 2023-06-06 (×4): qty 10

## 2023-06-06 NOTE — Progress Notes (Signed)
 PROGRESS NOTE   Diane Cohen  UJW:[id number] DOB: May 03, 1945 DOA: 06/04/2023 PCP: Benita Stabile, MD   Chief Complaint  Patient presents with   Shortness of Breath   Level of care: Med-Surg  Brief Admission History:  78 y.o. female, with past medical history of atrial fibrillation, on Eliquis, chronic diastolic CHF, hypertension, TIA, hypothyroidism, hyperlipidemia, nocturnal hypoxemia on 2 L oxygen at nighttime only.  Patient presents to ED secondary to complaints of shortness of breath, and cough, reports symptoms ongoing for last 3 days, initially cough, generalized weakness and fatigue, patient took her pneumonia and RSV vaccine yesterday, patient report dyspnea over last 24 hours, cough and nasal congestion which prompted her to come to ED -In ED she was 82% on room air, she was dyspneic by EMS where she received steroids and nebulizer treatments, on 3 L oxygen, chest x-ray significant for multifocal pneumonia, [facility] hospitalist consulted to admit.   Assessment and Plan:  Acute on chronic respiratory failure with hypoxia Multifocal pneumonia -Patient presented with cough, congestion, requiring 2 L nasal cannula at nighttime only, she is currently requiring 3 L nasal cannula while awake, saturating 82% on room air while awake.  Chest x-ray significant for multifocal opacity. -Admitted under pneumonia pathway, follow blood cultures, sputum cultures, Legionella antigen, strep pneumonia antigen (negative). -She was encouraged to use incentive spirometry and flutter valve -Continue with IV Rocephin and azithromycin. -pt continues to cough and wheeze, added scheduled bronchodilators treatments  -added prednisone 40 mg daily    Nocturnal hypoxemia -By reviewing pulmonary note, sleep study has been negative for OSA but significant for nocturnal hypoxemia, so started on oxygen for that   Periodic  limb movement -continue with Lyrica    Parox A-fib -continue with amiodarone and Cardizem  for heart rate control -Continue with Eliquis for anticoagulation   Chronic diastolic CHF -BNP mildly elevated, resumed home torsemide  -Continue with Farxiga   Hypokalemia -Replaced   Hypertension -Continue with home medications   Hypothyroidism -Continue with Synthroid  DVT prophylaxis: rivaroxaban Code Status: Full  Family Communication:  Disposition: anticipate home in next 1-2 days   Consultants:   Procedures:   Antimicrobials:  Ceftriaxone 3/28>> Azithromycin 3/28>>   Subjective: Persistent wheezing but less coughing today; no fever or chills  Objective: Vitals:   06/05/23 2049 06/05/23 2342 06/06/23 0402 06/06/23 0908  BP: (!) 113/53     Pulse: 88     Resp: 16     Temp: 98.1 F (36.7 C)     TempSrc: Oral     SpO2: 96% 92% 95% (!) 85%  Weight:      Height:        Intake/Output Summary (Last 24 hours) at 06/06/2023 1231 Last data filed at 06/06/2023 0900 Gross per 24 hour  Intake 830.03 ml  Output --  Net 830.03 ml   Filed Weights   06/04/23 1658  Weight: 80.7 kg   Examination:  General exam: Appears calm and comfortable  Respiratory system: bilateral expiratory wheezing heard, moderate to severe increased work of breathing, rales heard RLL.  Cardiovascular system: normal S1 & S2 heard. No JVD, murmurs, rubs, gallops or clicks. No pedal edema. Gastrointestinal system: Abdomen is nondistended, soft and nontender. No organomegaly or masses felt. Normal bowel sounds heard. Central nervous system: Alert and oriented. No focal neurological deficits. Extremities: Symmetric 5 x 5 power. Skin: No rashes, lesions or ulcers. Psychiatry: Judgement and insight appear normal. Mood & affect appropriate.   Data Reviewed: I have  personally reviewed following labs and imaging studies  CBC: Recent Labs  Lab 06/04/23 1823 06/05/23 0351  WBC 7.7 6.7  NEUTROABS 6.7  --   HGB 12.8 12.3  HCT 40.1 38.4  MCV 101.8* 101.3*  PLT 186 208    Basic Metabolic  Panel: Recent Labs  Lab 06/04/23 1823 06/05/23 0351 06/06/23 0433  NA 136 139 139  K 3.3* 3.9 3.4*  CL 103 107 105  CO2 23 23 23   GLUCOSE 139* 150* 107*  BUN 16 13 20   CREATININE 0.93 0.93 0.97  CALCIUM 9.5 9.8 9.7  MG  --   --  2.2    CBG: No results for input(s): "GLUCAP" in the last 168 hours.  Recent Results (from the past 240 hours)  Resp panel by RT-PCR (RSV, Flu A&B, Covid) Anterior Nasal Swab     Status: None   Collection Time: 06/04/23  9:01 PM   Specimen: Anterior Nasal Swab  Result Value Ref Range Status   SARS Coronavirus 2 by RT PCR NEGATIVE NEGATIVE Final    Comment: (NOTE) SARS-CoV-2 target nucleic acids are NOT DETECTED.  The SARS-CoV-2 RNA is generally detectable in upper respiratory specimens during the acute phase of infection. The lowest concentration of SARS-CoV-2 viral copies this assay can detect is 138 copies/mL. A negative result does not preclude SARS-Cov-2 infection and should not be used as the sole basis for treatment or other patient management decisions. A negative result may occur with  improper specimen collection/handling, submission of specimen other than nasopharyngeal swab, presence of viral mutation(s) within the areas targeted by this assay, and inadequate number of viral copies(<138 copies/mL). A negative result must be combined with clinical observations, patient history, and epidemiological information. The expected result is Negative.  Fact Sheet for Patients:  BloggerCourse.com  Fact Sheet for Healthcare Providers:  SeriousBroker.it  This test is no t yet approved or cleared by the [place] FDA and  has been authorized for detection and/or diagnosis of SARS-CoV-2 by FDA under an Emergency Use Authorization (EUA). This EUA will remain  in effect (meaning this test can be used) for the duration of the COVID-19 declaration under Section 564(b)(1) of the Act,  21 U.S.C.section 360bbb-3(b)(1), unless the authorization is terminated  or revoked sooner.       Influenza A by PCR NEGATIVE NEGATIVE Final   Influenza B by PCR NEGATIVE NEGATIVE Final    Comment: (NOTE) The Xpert Xpress SARS-CoV-2/FLU/RSV plus assay is intended as an aid in the diagnosis of influenza from Nasopharyngeal swab specimens and should not be used as a sole basis for treatment. Nasal washings and aspirates are unacceptable for Xpert Xpress SARS-CoV-2/FLU/RSV testing.  Fact Sheet for Patients: BloggerCourse.com  Fact Sheet for Healthcare Providers: SeriousBroker.it  This test is not yet approved or cleared by the [place] FDA and has been authorized for detection and/or diagnosis of SARS-CoV-2 by FDA under an Emergency Use Authorization (EUA). This EUA will remain in effect (meaning this test can be used) for the duration of the COVID-19 declaration under Section 564(b)(1) of the Act, 21 U.S.C. section 360bbb-3(b)(1), unless the authorization is terminated or revoked.     Resp Syncytial Virus by PCR NEGATIVE NEGATIVE Final    Comment: (NOTE) Fact Sheet for Patients: BloggerCourse.com  Fact Sheet for Healthcare Providers: SeriousBroker.it  This test is not yet approved or cleared by the [place] FDA and has been authorized for detection and/or diagnosis of SARS-CoV-2 by FDA under an Emergency Use Authorization (EUA).  This EUA will remain in effect (meaning this test can be used) for the duration of the COVID-19 declaration under Section 564(b)(1) of the Act, 21 U.S.C. section 360bbb-3(b)(1), unless the authorization is terminated or revoked.  Performed at [facility], [address] [place], [place] [address]   Culture, blood (routine x 2) Call MD if unable to obtain prior to antibiotics being given     Status: None (Preliminary result)   Collection  Time: 06/04/23  9:30 PM   Specimen: BLOOD  Result Value Ref Range Status   Specimen Description BLOOD BLOOD RIGHT HAND  Final   Special Requests BOTTLES DRAWN AEROBIC AND ANAEROBIC  Final   Culture   Final    NO GROWTH < 12 HOURS Performed at [facility], [address] [place], [place] [address]    Report Status PENDING  Incomplete  Culture, blood (Routine X 2) w Reflex to ID Panel     Status: None (Preliminary result)   Collection Time: 06/04/23  9:40 PM   Specimen: BLOOD  Result Value Ref Range Status   Specimen Description BLOOD BLOOD RIGHT HAND  Final   Special Requests BOTTLES DRAWN AEROBIC AND ANAEROBIC  Final   Culture   Final    NO GROWTH < 12 HOURS Performed at [facility], [address] [place], [place] [address]    Report Status PENDING  Incomplete     Radiology Studies: DG Chest Portable 1 View Result Date: 06/04/2023 CLINICAL DATA:  Cough, shortness of breath EXAM: PORTABLE CHEST 1 VIEW COMPARISON:  06/23/2019 FINDINGS: The heart size and mediastinal contours are within normal limits. Aortic atherosclerosis. Bilateral interstitial opacities, most pronounced within the right upper lobe and left lower lobe. No pleural effusion or pneumothorax. The visualized skeletal structures are unremarkable. IMPRESSION: Bilateral interstitial opacities, most pronounced within the right upper lobe and left lower lobe. Findings may reflect atypical/viral infection versus pulmonary edema. Electronically Signed   By: Duanne Guess D.O.   On: 06/04/2023 17:52    Scheduled Meds:  amiodarone  200 mg Oral Daily   atorvastatin  20 mg Oral Daily   dapagliflozin propanediol  10 mg Oral QAC breakfast   diltiazem  180 mg Oral Daily   DULoxetine  60 mg Oral Daily   guaiFENesin  600 mg Oral BID   ipratropium-albuterol  3 mL Nebulization Q4H   levothyroxine  125 mcg Oral q morning   lisinopril  10 mg Oral Daily   metoprolol succinate  12.5 mg Oral Daily   potassium chloride SA  20 mEq  Oral Daily   predniSONE  40 mg Oral Q breakfast   pregabalin  75 mg Oral QHS   Rivaroxaban  15 mg Oral Q supper   torsemide  20 mg Oral Daily   Continuous Infusions:  azithromycin Stopped (06/05/23 1809)   cefTRIAXone (ROCEPHIN)  IV Stopped (06/05/23 2159)    LOS: 1 day   Time spent: 55 mins  Camay Pedigo Laural Benes, MD How to contact the [facility] Attending or Consulting provider 7A - 7P or covering provider during after hours 7P -7A, for this patient?  Check the care team in [facility] and look for a) attending/consulting [facility] provider listed and b) the [facility] team listed Log into [email or web address] to find provider on call.  Locate the [facility] provider you are looking for under [facility] and page to a number that you can be directly reached. If you still have difficulty reaching the provider, please page the [facility] (Director on Call) for  the Hospitalists listed on amion for assistance.  06/06/2023, 12:31 PM

## 2023-06-06 NOTE — Plan of Care (Signed)

## 2023-06-07 DIAGNOSIS — E039 Hypothyroidism, unspecified: Secondary | ICD-10-CM | POA: Diagnosis not present

## 2023-06-07 DIAGNOSIS — J189 Pneumonia, unspecified organism: Secondary | ICD-10-CM | POA: Diagnosis not present

## 2023-06-07 DIAGNOSIS — I5032 Chronic diastolic (congestive) heart failure: Secondary | ICD-10-CM | POA: Diagnosis not present

## 2023-06-07 DIAGNOSIS — I10 Essential (primary) hypertension: Secondary | ICD-10-CM | POA: Diagnosis not present

## 2023-06-07 LAB — LEGIONELLA PNEUMOPHILA SEROGP 1 UR AG: L. pneumophila Serogp 1 Ur Ag: NEGATIVE

## 2023-06-07 MED ORDER — METHYLPREDNISOLONE SODIUM SUCC 40 MG IJ SOLR
40.0000 mg | Freq: Two times a day (BID) | INTRAMUSCULAR | Status: DC
  Administered 2023-06-07 – 2023-06-12 (×11): 40 mg via INTRAVENOUS
  Filled 2023-06-07 (×11): qty 1

## 2023-06-07 MED ORDER — POTASSIUM CHLORIDE CRYS ER 20 MEQ PO TBCR
40.0000 meq | EXTENDED_RELEASE_TABLET | Freq: Every day | ORAL | Status: DC
  Administered 2023-06-07 – 2023-06-09 (×3): 40 meq via ORAL
  Filled 2023-06-07 (×2): qty 2

## 2023-06-07 NOTE — Plan of Care (Signed)
   Problem: Education: Goal: Knowledge of General Education information will improve Description Including pain rating scale, medication(s)/side effects and non-pharmacologic comfort measures Outcome: Progressing

## 2023-06-07 NOTE — TOC Initial Note (Signed)
 Transition of Care [facility]) - Initial/Assessment Note    Patient Details  Name: Diane Cohen MRN: [id number] Date of Birth: [birth year]  Transition of Care [facility]) CM/SW Contact:    Beather Arbour Phone Number: 06/07/2023, 12:30 PM  Clinical Narrative:                 This Clinical research associate assessed patient. Patient grandson lives with her temporarily. Patient has oxygen at home and is independent . Patient is still able to drive. CSW did reach out to [employer] and they were able to confirm that patient oxygen is from them . MD placed an order for a nebulizer machine, which [employer] was able to assist with. Patient agreeable. Daughter did mention something about a portal machine for home if patient goes out and Ian Malkin stated that MD would just need to place a order for a a POC evaluation so they can assess at home. MD made aware. TOC will continue to follow.   Expected Discharge Plan: OP Rehab Barriers to Discharge: Continued Medical Work up   Patient Goals and CMS Choice Patient states their goals for this hospitalization and ongoing recovery are:: return back home CMS Medicare.gov Compare Post Acute Care list provided to:: Patient Choice offered to / list presented to : Patient      Expected Discharge Plan and Services In-house Referral: Clinical Social Work   Post Acute Care Choice: Durable Medical Equipment Living arrangements for the past 2 months: Single Family Home                 DME Arranged: Nebulizer machine DME Agency: [employer] Date DME Agency Contacted: 06/07/23 Time DME Agency Contacted: 1229 Representative spoke with at DME Agency: Ian Malkin HH Arranged: Refused HH          Prior Living Arrangements/Services Living arrangements for the past 2 months: Single Family Home Lives with::  (Grandson temporarily) Patient language and need for interpreter reviewed:: Yes Do you feel safe going back to the place where you live?: Yes      Need for Family Participation in Patient Care:  No (Comment) (Pt is independent) Care giver support system in place?: No (comment) (Pt is independent) Current home services: DME Criminal Activity/Legal Involvement Pertinent to Current Situation/Hospitalization: No - Comment as needed  Activities of Daily Living      Permission Sought/Granted      Share Information with NAME: Renella     Permission granted to share info w Relationship: Patient     Emotional Assessment Appearance:: Appears stated age Attitude/Demeanor/Rapport: Engaged Affect (typically observed): Accepting, Appropriate Orientation: : Oriented to Self, Oriented to Place, Oriented to  Time, Oriented to Situation Alcohol / Substance Use: Not Applicable Psych Involvement: No (comment)  Admission diagnosis:  Pneumonia [J18.9] Community acquired pneumonia of right upper lobe of lung [J18.9] Multifocal pneumonia [J18.9] Patient Active Problem List   Diagnosis Date Noted   Multifocal pneumonia 06/05/2023   Pneumonia 06/04/2023   Loud snoring 08/19/2022   Acute bilateral low back pain without sciatica    Atrial fibrillation with rapid ventricular response (HCC) 06/23/2019   History of colonic polyps 12/13/2018   Constipation 12/13/2018   Abdominal pain, epigastric 12/13/2018   History of hiatal hernia 05/12/2018   Encounter for screening colonoscopy 05/12/2018   Acute on chronic diastolic CHF (congestive heart failure) (HCC) 04/20/2018   Swelling of limb 11/09/2017   Chronic diastolic heart failure (HCC) 12/29/2013   Anemia 09/20/2013   Bilateral leg edema 08/29/2013   Essential  hypertension, benign    Hypothyroidism    Persistent atrial fibrillation    PCP:  Benita Stabile, MD Pharmacy:   [facility] [phone] - [place], [place] - [address] AT SEC [address] [place] [address] Phone: [phone] Fax: [phone]     Social Drivers of Health (SDOH) Social History: SDOH Screenings   Food  Insecurity: No Food Insecurity (06/05/2023)  Housing: Low Risk  (06/05/2023)  Transportation Needs: No Transportation Needs (06/05/2023)  Utilities: Not At Risk (06/05/2023)  Depression (PHQ2-9): Low Risk  (05/02/2018)  Social Connections: Unknown (06/05/2023)  Tobacco Use: Medium Risk (06/04/2023)   SDOH Interventions:     Readmission Risk Interventions    06/07/2023   12:23 PM  Readmission Risk Prevention Plan  Medication Screening Complete  Transportation Screening Complete

## 2023-06-07 NOTE — Progress Notes (Addendum)
 PROGRESS NOTE   Diane Cohen  AOZ:[id number] DOB: [birth year] DOA: 06/04/2023 PCP: Benita Stabile, MD   Chief Complaint  Patient presents with   Shortness of Breath   Level of care: Med-Surg  Brief Admission History:  78 y.o. female, with past medical history of atrial fibrillation, on Eliquis, chronic diastolic CHF, hypertension, TIA, hypothyroidism, hyperlipidemia, nocturnal hypoxemia on 2 L oxygen at nighttime only.  Patient presents to ED secondary to complaints of shortness of breath, and cough, reports symptoms ongoing for last 3 days, initially cough, generalized weakness and fatigue, patient took her pneumonia and RSV vaccine yesterday, patient report dyspnea over last 24 hours, cough and nasal congestion which prompted her to come to ED -In ED she was 82% on room air, she was dyspneic by EMS where she received steroids and nebulizer treatments, on 3 L oxygen, chest x-ray significant for multifocal pneumonia, [facility] hospitalist consulted to admit.   Assessment and Plan:  Acute on chronic respiratory failure with hypoxia Multifocal pneumonia -Patient presented with cough, congestion, requiring 2 L nasal cannula at nighttime only, she is currently requiring 3 L nasal cannula while awake, saturating 82% on room air while awake.  Chest x-ray significant for multifocal opacity. -Admitted under pneumonia pathway, follow blood cultures, sputum cultures, Legionella antigen (negative), strep pneumonia antigen (negative). -She was encouraged to use incentive spirometry and flutter valve -Continue with IV Rocephin and azithromycin. -pt continues to cough and wheeze, added scheduled bronchodilators treatments  -changed to IV solumedrol today to achieve better results    Nocturnal hypoxemia -By reviewing pulmonary note, sleep study has been negative for OSA but significant for nocturnal hypoxemia, so started on oxygen for that   Periodic  limb movement -continue with Lyrica    Parox  A-fib -continue with amiodarone and Cardizem for heart rate control -Continue with Eliquis for anticoagulation   Chronic diastolic CHF -BNP mildly elevated, resumed home torsemide  -Continue with Farxiga   Hypokalemia -Replaced   Hypertension -Continue with home medications   Hypothyroidism -Continue with Synthroid  DVT prophylaxis: rivaroxaban Code Status: Full  Family Communication:  Disposition: anticipate home tomorrow if improved   Consultants:   Procedures:   Antimicrobials:  Ceftriaxone 3/28>> Azithromycin 3/28>>   Subjective: Pt says she is not feeling well today, still coughing and wheezing and SOB at rest.   Objective: Vitals:   06/07/23 0457 06/07/23 0734 06/07/23 1108 06/07/23 1352  BP: 125/88   130/67  Pulse: 81   87  Resp: 17     Temp: 98 F (36.7 C)   98.3 F (36.8 C)  TempSrc: Oral   Oral  SpO2: 96% 97% 96% 97%  Weight:      Height:        Intake/Output Summary (Last 24 hours) at 06/07/2023 1409 Last data filed at 06/07/2023 1300 Gross per 24 hour  Intake 720 ml  Output --  Net 720 ml   Filed Weights   06/04/23 1658  Weight: 80.7 kg   Examination:  General exam: Appears calm and comfortable  Respiratory system: bilateral expiratory wheezing heard, moderate to severe increased work of breathing, rales heard RLL.  Cardiovascular system: normal S1 & S2 heard. No JVD, murmurs, rubs, gallops or clicks. No pedal edema. Gastrointestinal system: Abdomen is nondistended, soft and nontender. No organomegaly or masses felt. Normal bowel sounds heard. Central nervous system: Alert and oriented. No focal neurological deficits. Extremities: Symmetric 5 x 5 power. Skin: No rashes, lesions or ulcers. Psychiatry: Judgement and insight  appear normal. Mood & affect appropriate.   Data Reviewed: I have personally reviewed following labs and imaging studies  CBC: Recent Labs  Lab 06/04/23 1823 06/05/23 0351  WBC 7.7 6.7  NEUTROABS 6.7  --   HGB  12.8 12.3  HCT 40.1 38.4  MCV 101.8* 101.3*  PLT 186 208    Basic Metabolic Panel: Recent Labs  Lab 06/04/23 1823 06/05/23 0351 06/06/23 0433  NA 136 139 139  K 3.3* 3.9 3.4*  CL 103 107 105  CO2 23 23 23   GLUCOSE 139* 150* 107*  BUN 16 13 20   CREATININE 0.93 0.93 0.97  CALCIUM 9.5 9.8 9.7  MG  --   --  2.2    CBG: No results for input(s): "GLUCAP" in the last 168 hours.  Recent Results (from the past 240 hours)  Resp panel by RT-PCR (RSV, Flu A&B, Covid) Anterior Nasal Swab     Status: None   Collection Time: 06/04/23  9:01 PM   Specimen: Anterior Nasal Swab  Result Value Ref Range Status   SARS Coronavirus 2 by RT PCR NEGATIVE NEGATIVE Final    Comment: (NOTE) SARS-CoV-2 target nucleic acids are NOT DETECTED.  The SARS-CoV-2 RNA is generally detectable in upper respiratory specimens during the acute phase of infection. The lowest concentration of SARS-CoV-2 viral copies this assay can detect is 138 copies/mL. A negative result does not preclude SARS-Cov-2 infection and should not be used as the sole basis for treatment or other patient management decisions. A negative result may occur with  improper specimen collection/handling, submission of specimen other than nasopharyngeal swab, presence of viral mutation(s) within the areas targeted by this assay, and inadequate number of viral copies(<138 copies/mL). A negative result must be combined with clinical observations, patient history, and epidemiological information. The expected result is Negative.  Fact Sheet for Patients:  BloggerCourse.com  Fact Sheet for Healthcare Providers:  SeriousBroker.it  This test is no t yet approved or cleared by the [place] FDA and  has been authorized for detection and/or diagnosis of SARS-CoV-2 by FDA under an Emergency Use Authorization (EUA). This EUA will remain  in effect (meaning this test can be used) for the  duration of the COVID-19 declaration under Section 564(b)(1) of the Act, 21 U.S.C.section 360bbb-3(b)(1), unless the authorization is terminated  or revoked sooner.       Influenza A by PCR NEGATIVE NEGATIVE Final   Influenza B by PCR NEGATIVE NEGATIVE Final    Comment: (NOTE) The Xpert Xpress SARS-CoV-2/FLU/RSV plus assay is intended as an aid in the diagnosis of influenza from Nasopharyngeal swab specimens and should not be used as a sole basis for treatment. Nasal washings and aspirates are unacceptable for Xpert Xpress SARS-CoV-2/FLU/RSV testing.  Fact Sheet for Patients: BloggerCourse.com  Fact Sheet for Healthcare Providers: SeriousBroker.it  This test is not yet approved or cleared by the [place] FDA and has been authorized for detection and/or diagnosis of SARS-CoV-2 by FDA under an Emergency Use Authorization (EUA). This EUA will remain in effect (meaning this test can be used) for the duration of the COVID-19 declaration under Section 564(b)(1) of the Act, 21 U.S.C. section 360bbb-3(b)(1), unless the authorization is terminated or revoked.     Resp Syncytial Virus by PCR NEGATIVE NEGATIVE Final    Comment: (NOTE) Fact Sheet for Patients: BloggerCourse.com  Fact Sheet for Healthcare Providers: SeriousBroker.it  This test is not yet approved or cleared by the [place] FDA and has been authorized for detection  and/or diagnosis of SARS-CoV-2 by FDA under an Emergency Use Authorization (EUA). This EUA will remain in effect (meaning this test can be used) for the duration of the COVID-19 declaration under Section 564(b)(1) of the Act, 21 U.S.C. section 360bbb-3(b)(1), unless the authorization is terminated or revoked.  Performed at [facility], [address], [place], [place] [address]   Culture, blood (routine x 2) Call MD if unable to obtain prior to  antibiotics being given     Status: None (Preliminary result)   Collection Time: 06/04/23  9:30 PM   Specimen: BLOOD  Result Value Ref Range Status   Specimen Description BLOOD BLOOD RIGHT HAND  Final   Special Requests BOTTLES DRAWN AEROBIC AND ANAEROBIC  Final   Culture   Final    NO GROWTH 3 DAYS Performed at [facility], [address] [place], [place] [address]    Report Status PENDING  Incomplete  Culture, blood (Routine X 2) w Reflex to ID Panel     Status: None (Preliminary result)   Collection Time: 06/04/23  9:40 PM   Specimen: BLOOD  Result Value Ref Range Status   Specimen Description BLOOD BLOOD RIGHT HAND  Final   Special Requests BOTTLES DRAWN AEROBIC AND ANAEROBIC  Final   Culture   Final    NO GROWTH 3 DAYS Performed at [facility], [address] [place], [place] [address]    Report Status PENDING  Incomplete     Radiology Studies: No results found.   Scheduled Meds:  amiodarone  200 mg Oral Daily   atorvastatin  20 mg Oral Daily   dapagliflozin propanediol  10 mg Oral QAC breakfast   diltiazem  180 mg Oral Daily   DULoxetine  60 mg Oral Daily   guaiFENesin  600 mg Oral BID   ipratropium-albuterol  3 mL Nebulization Q4H   levothyroxine  125 mcg Oral q morning   lisinopril  10 mg Oral Daily   methylPREDNISolone (SOLU-MEDROL) injection  40 mg Intravenous Q12H   metoprolol succinate  12.5 mg Oral Daily   potassium chloride SA  40 mEq Oral Daily   pregabalin  75 mg Oral QHS   Rivaroxaban  15 mg Oral Q supper   torsemide  20 mg Oral Daily   Continuous Infusions:  azithromycin 500 mg (06/06/23 1730)   cefTRIAXone (ROCEPHIN)  IV 2 g (06/06/23 2155)    LOS: 2 days   Time spent: 55 mins  Lonie Rummell Laural Benes, MD How to contact the [facility] Attending or Consulting provider 7A - 7P or covering provider during after hours 7P -7A, for this patient?  Check the care team in [facility] and look for a) attending/consulting TRH provider listed and b) the [facility] team listed Log  into [email or web address] to find provider on call.  Locate the [facility] provider you are looking for under [facility] and page to a number that you can be directly reached. If you still have difficulty reaching the provider, please page the [facility] (Director on Call) for the Hospitalists listed on amion for assistance.  06/07/2023, 2:09 PM

## 2023-06-07 NOTE — Plan of Care (Signed)
  Problem: Education: Goal: Knowledge of General Education information will improve Description: Including pain rating scale, medication(s)/side effects and non-pharmacologic comfort measures Outcome: Progressing   Problem: Health Behavior/Discharge Planning: Goal: Ability to manage health-related needs will improve Outcome: Progressing   Problem: Clinical Measurements: Goal: Ability to maintain clinical measurements within normal limits will improve Outcome: Progressing Goal: Will remain free from infection Outcome: Progressing Goal: Diagnostic test results will improve Outcome: Progressing Goal: Respiratory complications will improve Outcome: Progressing Goal: Cardiovascular complication will be avoided Outcome: Progressing   Problem: Activity: Goal: Risk for activity intolerance will decrease Outcome: Progressing   Problem: Nutrition: Goal: Adequate nutrition will be maintained Outcome: Progressing   Problem: Coping: Goal: Level of anxiety will decrease Outcome: Progressing   Problem: Elimination: Goal: Will not experience complications related to bowel motility Outcome: Adequate for Discharge Goal: Will not experience complications related to urinary retention Outcome: Adequate for Discharge   Problem: Pain Managment: Goal: General experience of comfort will improve and/or be controlled Outcome: Progressing   Problem: Safety: Goal: Ability to remain free from injury will improve Outcome: Progressing   Problem: Skin Integrity: Goal: Risk for impaired skin integrity will decrease Outcome: Progressing   Problem: Activity: Goal: Ability to tolerate increased activity will improve Outcome: Progressing   Problem: Clinical Measurements: Goal: Ability to maintain a body temperature in the normal range will improve Outcome: Progressing   Problem: Respiratory: Goal: Ability to maintain adequate ventilation will improve Outcome: Progressing Goal: Ability to  maintain a clear airway will improve Outcome: Progressing

## 2023-06-08 ENCOUNTER — Encounter (HOSPITAL_COMMUNITY): Payer: Self-pay | Admitting: Family Medicine

## 2023-06-08 DIAGNOSIS — E039 Hypothyroidism, unspecified: Secondary | ICD-10-CM | POA: Diagnosis not present

## 2023-06-08 DIAGNOSIS — I10 Essential (primary) hypertension: Secondary | ICD-10-CM | POA: Diagnosis not present

## 2023-06-08 DIAGNOSIS — I5032 Chronic diastolic (congestive) heart failure: Secondary | ICD-10-CM | POA: Diagnosis not present

## 2023-06-08 DIAGNOSIS — J189 Pneumonia, unspecified organism: Secondary | ICD-10-CM | POA: Diagnosis not present

## 2023-06-08 MED ORDER — HYDROCOD POLI-CHLORPHE POLI ER 10-8 MG/5ML PO SUER
5.0000 mL | Freq: Two times a day (BID) | ORAL | Status: DC | PRN
  Administered 2023-06-08 – 2023-06-10 (×5): 5 mL via ORAL
  Filled 2023-06-08 (×5): qty 5

## 2023-06-08 MED ORDER — ORAL CARE MOUTH RINSE: 15.0000 mL | OROMUCOSAL | Status: DC | PRN

## 2023-06-08 NOTE — Progress Notes (Signed)
 PROGRESS NOTE   Diane Cohen  WUJ:[id number] DOB: [birth year] DOA: 06/04/2023 PCP: Benita Stabile, MD   Chief Complaint  Patient presents with   Shortness of Breath   Level of care: Med-Surg  Brief Admission History:  78 y.o. female, with past medical history of atrial fibrillation, on Eliquis, chronic diastolic CHF, hypertension, TIA, hypothyroidism, hyperlipidemia, nocturnal hypoxemia on 2 L oxygen at nighttime only.  Patient presents to ED secondary to complaints of shortness of breath, and cough, reports symptoms ongoing for last 3 days, initially cough, generalized weakness and fatigue, patient took her pneumonia and RSV vaccine yesterday, patient report dyspnea over last 24 hours, cough and nasal congestion which prompted her to come to ED -In ED she was 82% on room air, she was dyspneic by EMS where she received steroids and nebulizer treatments, on 3 L oxygen, chest x-ray significant for multifocal pneumonia, [facility]ist consulted to admit.   Assessment and Plan:  Acute on chronic respiratory failure with hypoxia Multifocal pneumonia -Patient presented with cough, congestion, requiring 2 L nasal cannula at nighttime only, she is currently requiring 3 L nasal cannula while awake, saturating 82% on room air while awake.  Chest x-ray significant for multifocal opacity. -Admitted under pneumonia pathway, follow blood cultures, sputum cultures, Legionella antigen (negative), strep pneumonia antigen (negative). -She was encouraged to use incentive spirometry and flutter valve -Continue with IV Rocephin and azithromycin. -pt continues to cough and wheeze, added scheduled bronchodilators treatments  -changed to IV solumedrol to achieve better results  -pt still not medically stable to discharge home today -continue current treatments -repeat CXR in AM  -encouraged ambulation  -pulmonary toilet   Nocturnal hypoxemia (chronic) -By reviewing pulmonary note, sleep study has been  negative for OSA but significant for nocturnal hypoxemia, so started on oxygen for that   Periodic  limb movement -continue with Lyrica    Parox A-fib -continue with amiodarone and Cardizem for heart rate control -Continue with Eliquis for anticoagulation   Chronic diastolic CHF -BNP mildly elevated, resumed home torsemide  -Continue with Farxiga   Hypokalemia -Replaced   Hypertension -Continue with home medications   Hypothyroidism -Continue with Synthroid  DVT prophylaxis: rivaroxaban Code Status: Full  Family Communication:  Disposition: anticipate home when medically stable   Consultants:   Procedures:   Antimicrobials:  Ceftriaxone 3/28>> Azithromycin 3/28>>   Subjective: Pt continues coughing and wheezing, cough mostly nonproductive, wheezing persistent.    Objective: Vitals:   06/07/23 2004 06/08/23 0512 06/08/23 0742 06/08/23 1208  BP: 118/64 (!) 105/58  (!) 151/76  Pulse: 79 77  88  Resp: (!) 22 18  20   Temp: 97.8 F (36.6 C) 97.9 F (36.6 C)  98 F (36.7 C)  TempSrc:    Oral  SpO2: 96% 98% 96% 94%  Weight:      Height:        Intake/Output Summary (Last 24 hours) at 06/08/2023 1221 Last data filed at 06/08/2023 0908 Gross per 24 hour  Intake 1905.5 ml  Output --  Net 1905.5 ml   Filed Weights   06/04/23 1658  Weight: 80.7 kg   Examination:  General exam: Appears calm and comfortable  Respiratory system: diffuse expiratory wheezing heard R>L, moderate increased work of breathing, rales heard RLL. Pt speaking in short sentences during exam.  Pt has dry nonproductive cough likely triggered by wheeze. Cardiovascular system: normal S1 & S2 heard. No JVD, murmurs, rubs, gallops or clicks. No pedal edema. Gastrointestinal system: Abdomen is nondistended,  soft and nontender. No organomegaly or masses felt. Normal bowel sounds heard. Central nervous system: Alert and oriented. No focal neurological deficits. Extremities: Symmetric 5 x 5  power. Skin: No rashes, lesions or ulcers. Psychiatry: Judgement and insight appear normal. Mood & affect appropriate.   Data Reviewed: I have personally reviewed following labs and imaging studies  CBC: Recent Labs  Lab 06/04/23 1823 06/05/23 0351  WBC 7.7 6.7  NEUTROABS 6.7  --   HGB 12.8 12.3  HCT 40.1 38.4  MCV 101.8* 101.3*  PLT 186 208    Basic Metabolic Panel: Recent Labs  Lab 06/04/23 1823 06/05/23 0351 06/06/23 0433  NA 136 139 139  K 3.3* 3.9 3.4*  CL 103 107 105  CO2 23 23 23   GLUCOSE 139* 150* 107*  BUN 16 13 20   CREATININE 0.93 0.93 0.97  CALCIUM 9.5 9.8 9.7  MG  --   --  2.2    CBG: No results for input(s): "GLUCAP" in the last 168 hours.  Recent Results (from the past 240 hours)  Resp panel by RT-PCR (RSV, Flu A&B, Covid) Anterior Nasal Swab     Status: None   Collection Time: 06/04/23  9:01 PM   Specimen: Anterior Nasal Swab  Result Value Ref Range Status   SARS Coronavirus 2 by RT PCR NEGATIVE NEGATIVE Final    Comment: (NOTE) SARS-CoV-2 target nucleic acids are NOT DETECTED.  The SARS-CoV-2 RNA is generally detectable in upper respiratory specimens during the acute phase of infection. The lowest concentration of SARS-CoV-2 viral copies this assay can detect is 138 copies/mL. A negative result does not preclude SARS-Cov-2 infection and should not be used as the sole basis for treatment or other patient management decisions. A negative result may occur with  improper specimen collection/handling, submission of specimen other than nasopharyngeal swab, presence of viral mutation(s) within the areas targeted by this assay, and inadequate number of viral copies(<138 copies/mL). A negative result must be combined with clinical observations, patient history, and epidemiological information. The expected result is Negative.  Fact Sheet for Patients:  BloggerCourse.com  Fact Sheet for Healthcare Providers:   SeriousBroker.it  This test is no t yet approved or cleared by the [place] FDA and  has been authorized for detection and/or diagnosis of SARS-CoV-2 by FDA under an Emergency Use Authorization (EUA). This EUA will remain  in effect (meaning this test can be used) for the duration of the COVID-19 declaration under Section 564(b)(1) of the Act, 21 U.S.C.section 360bbb-3(b)(1), unless the authorization is terminated  or revoked sooner.       Influenza A by PCR NEGATIVE NEGATIVE Final   Influenza B by PCR NEGATIVE NEGATIVE Final    Comment: (NOTE) The Xpert Xpress SARS-CoV-2/FLU/RSV plus assay is intended as an aid in the diagnosis of influenza from Nasopharyngeal swab specimens and should not be used as a sole basis for treatment. Nasal washings and aspirates are unacceptable for Xpert Xpress SARS-CoV-2/FLU/RSV testing.  Fact Sheet for Patients: BloggerCourse.com  Fact Sheet for Healthcare Providers: SeriousBroker.it  This test is not yet approved or cleared by the [place] FDA and has been authorized for detection and/or diagnosis of SARS-CoV-2 by FDA under an Emergency Use Authorization (EUA). This EUA will remain in effect (meaning this test can be used) for the duration of the COVID-19 declaration under Section 564(b)(1) of the Act, 21 U.S.C. section 360bbb-3(b)(1), unless the authorization is terminated or revoked.     Resp Syncytial Virus by PCR NEGATIVE NEGATIVE  Final    Comment: (NOTE) Fact Sheet for Patients: BloggerCourse.com  Fact Sheet for Healthcare Providers: SeriousBroker.it  This test is not yet approved or cleared by the [place] FDA and has been authorized for detection and/or diagnosis of SARS-CoV-2 by FDA under an Emergency Use Authorization (EUA). This EUA will remain in effect (meaning this test can be used) for  the duration of the COVID-19 declaration under Section 564(b)(1) of the Act, 21 U.S.C. section 360bbb-3(b)(1), unless the authorization is terminated or revoked.  Performed at [facility], [address] [place], [place] [address]   Culture, blood (routine x 2) Call MD if unable to obtain prior to antibiotics being given     Status: None (Preliminary result)   Collection Time: 06/04/23  9:30 PM   Specimen: BLOOD  Result Value Ref Range Status   Specimen Description BLOOD BLOOD RIGHT HAND  Final   Special Requests BOTTLES DRAWN AEROBIC AND ANAEROBIC  Final   Culture   Final    NO GROWTH 4 DAYS Performed at [facility], [address], [place], [place] [address]    Report Status PENDING  Incomplete  Culture, blood (Routine X 2) w Reflex to ID Panel     Status: None (Preliminary result)   Collection Time: 06/04/23  9:40 PM   Specimen: BLOOD  Result Value Ref Range Status   Specimen Description BLOOD BLOOD RIGHT HAND  Final   Special Requests BOTTLES DRAWN AEROBIC AND ANAEROBIC  Final   Culture   Final    NO GROWTH 4 DAYS Performed at [facility], [address] [place], [place] [address]    Report Status PENDING  Incomplete     Radiology Studies: No results found.   Scheduled Meds:  amiodarone  200 mg Oral Daily   atorvastatin  20 mg Oral Daily   dapagliflozin propanediol  10 mg Oral QAC breakfast   diltiazem  180 mg Oral Daily   DULoxetine  60 mg Oral Daily   guaiFENesin  600 mg Oral BID   ipratropium-albuterol  3 mL Nebulization Q4H   levothyroxine  125 mcg Oral q morning   lisinopril  10 mg Oral Daily   methylPREDNISolone (SOLU-MEDROL) injection  40 mg Intravenous Q12H   metoprolol succinate  12.5 mg Oral Daily   potassium chloride SA  40 mEq Oral Daily   pregabalin  75 mg Oral QHS   Rivaroxaban  15 mg Oral Q supper   torsemide  20 mg Oral Daily   Continuous Infusions:  azithromycin 500 mg (06/07/23 1631)   cefTRIAXone (ROCEPHIN)  IV 2 g (06/07/23 2000)     LOS: 3 days   Time spent: 53 mins  Narvel Kozub Laural Benes, MD How to contact the [facility] Attending or Consulting provider 7A - 7P or covering provider during after hours 7P -7A, for this patient?  Check the care team in [facility] and look for a) attending/consulting [facility] provider listed and b) the [facility] team listed Log into [email or web address] to find provider on call.  Locate the [facility] provider you are looking for under [facility] and page to a number that you can be directly reached. If you still have difficulty reaching the provider, please page the [facility] (Director on Call) for the Hospitalists listed on amion for assistance.  06/08/2023, 12:21 PM

## 2023-06-08 NOTE — Plan of Care (Signed)

## 2023-06-09 ENCOUNTER — Inpatient Hospital Stay (HOSPITAL_COMMUNITY)

## 2023-06-09 DIAGNOSIS — J441 Chronic obstructive pulmonary disease with (acute) exacerbation: Secondary | ICD-10-CM

## 2023-06-09 DIAGNOSIS — I4819 Other persistent atrial fibrillation: Secondary | ICD-10-CM

## 2023-06-09 DIAGNOSIS — I10 Essential (primary) hypertension: Secondary | ICD-10-CM | POA: Diagnosis not present

## 2023-06-09 DIAGNOSIS — I5032 Chronic diastolic (congestive) heart failure: Secondary | ICD-10-CM | POA: Diagnosis not present

## 2023-06-09 LAB — CULTURE, BLOOD (ROUTINE X 2)
Culture: NO GROWTH
Culture: NO GROWTH

## 2023-06-09 LAB — BASIC METABOLIC PANEL WITH GFR
Anion gap: 8 (ref 5–15)
BUN: 32 mg/dL — ABNORMAL HIGH (ref 8–23)
CO2: 28 mmol/L (ref 22–32)
Calcium: 9.8 mg/dL (ref 8.9–10.3)
Chloride: 102 mmol/L (ref 98–111)
Creatinine, Ser: 1.2 mg/dL — ABNORMAL HIGH (ref 0.44–1.00)
GFR, Estimated: 47 mL/min — ABNORMAL LOW (ref >60)
Glucose, Bld: 138 mg/dL — ABNORMAL HIGH (ref 70–99)
Potassium: 3.9 mmol/L (ref 3.5–5.1)
Sodium: 138 mmol/L (ref 135–145)

## 2023-06-09 LAB — GLUCOSE, CAPILLARY: Glucose-Capillary: 170 mg/dL — ABNORMAL HIGH (ref 70–99)

## 2023-06-09 LAB — MAGNESIUM: Magnesium: 2.1 mg/dL (ref 1.7–2.4)

## 2023-06-09 MED ORDER — REVEFENACIN 175 MCG/3ML IN SOLN
175.0000 ug | Freq: Every day | RESPIRATORY_TRACT | Status: DC
  Administered 2023-06-10 – 2023-06-12 (×3): 175 ug via RESPIRATORY_TRACT
  Filled 2023-06-09 (×3): qty 3

## 2023-06-09 MED ORDER — IPRATROPIUM-ALBUTEROL 0.5-2.5 (3) MG/3ML IN SOLN
3.0000 mL | Freq: Four times a day (QID) | RESPIRATORY_TRACT | Status: AC
  Administered 2023-06-09 – 2023-06-10 (×3): 3 mL via RESPIRATORY_TRACT
  Filled 2023-06-09 (×3): qty 3

## 2023-06-09 MED ORDER — ALBUTEROL SULFATE (2.5 MG/3ML) 0.083% IN NEBU
2.5000 mg | INHALATION_SOLUTION | Freq: Four times a day (QID) | RESPIRATORY_TRACT | Status: DC
  Administered 2023-06-10 (×3): 2.5 mg via RESPIRATORY_TRACT
  Filled 2023-06-09 (×3): qty 3

## 2023-06-09 MED ORDER — ARFORMOTEROL TARTRATE 15 MCG/2ML IN NEBU
15.0000 ug | INHALATION_SOLUTION | Freq: Two times a day (BID) | RESPIRATORY_TRACT | Status: DC
  Administered 2023-06-09 – 2023-06-12 (×6): 15 ug via RESPIRATORY_TRACT
  Filled 2023-06-09 (×6): qty 2

## 2023-06-09 MED ORDER — BUDESONIDE 0.5 MG/2ML IN SUSP
0.5000 mg | Freq: Two times a day (BID) | RESPIRATORY_TRACT | Status: DC
  Administered 2023-06-09 – 2023-06-12 (×6): 0.5 mg via RESPIRATORY_TRACT
  Filled 2023-06-09 (×6): qty 2

## 2023-06-09 NOTE — Progress Notes (Addendum)
 PROGRESS NOTE  Diane Cohen HYQ:[id number] DOB: 04-05-1945 DOA: 06/04/2023 PCP: Benita Stabile, MD  Brief History:  78 y.o. female, with past medical history of atrial fibrillation, on Eliquis, chronic diastolic CHF, hypertension, TIA, hypothyroidism, hyperlipidemia, nocturnal hypoxemia on 2 L oxygen at nighttime only.  Patient presents to ED secondary to complaints of shortness of breath, and cough, reports symptoms ongoing for last 3 days, initially cough, generalized weakness and fatigue, patient took her pneumonia and RSV vaccine yesterday, patient report dyspnea over last 24 hours, cough and nasal congestion which prompted her to come to ED -In ED she was 82% on room air, she was dyspneic by EMS where she received steroids and nebulizer treatments, on 3 L oxygen, chest x-ray significant for multifocal pneumonia, [facility] hospitalist consulted to admit.  Patient was started on IV ceftriaxone and azithromycin.  IV solumedrol and duonebs were added.  Patient was slow to improve.   Assessment/Plan: Acute on chronic respiratory failure with hypoxia Multifocal/Lobar pneumonia -On 2 L nasal cannula at nighttime only at home -initially requiring 3 L nasal cannula while awake, saturating 82% on room air while awake.  Chest x-ray significant for multifocal opacity. -Admitted under pneumonia pathway, follow blood cultures (neg) , sputum cultures, Legionella antigen (negative), strep pneumonia antigen (negative). -Continue with IV Rocephin and azithromycin.--finish 5 days -pt continues to cough and wheeze, added scheduled bronchodilators treatments  -changed to IV solumedrol -add brovana and pulmicort -check viral resp panel -personally reviewed CXR 4/2--bibasilar opacities   Nocturnal hypoxemia (chronic) -By reviewing pulmonary note, sleep study has been negative for OSA but significant for nocturnal hypoxemia -pt is on 2L at night at home   Periodic  limb movement -continue with Lyrica     Paroxysmal A-fib -continue amiodarone and Cardizem -Continue with Eliquis   Chronic diastolic CHF -resumed home torsemide  -Continue with [place] -clinically euvolemic   Hypokalemia -Replaced   Hypertension -Continue with home medications   Hypothyroidism -Continue with Synthroid       Family Communication:   no Family at bedside  Consultants:  none  Code Status:  FULL  DVT Prophylaxis:  apixaban   Procedures: As Listed in Progress Note Above  Antibiotics: Ceftriaxone 3/28>> Azithro 3/28>>    Subjective: Pt is breathing a little better, but continues to have cough and sob with mild exertion.  Denies f/c, cp, n/v/d, abd pain  Objective: Vitals:   06/09/23 0500 06/09/23 0751 06/09/23 1322 06/09/23 1350  BP: 121/69   (!) 117/57  Pulse: 78   79  Resp: 18   20  Temp: 97.9 F (36.6 C)   (!) 97.5 F (36.4 C)  TempSrc: Oral   Oral  SpO2: 97% 96% (!) 87% 98%  Weight:      Height:        Intake/Output Summary (Last 24 hours) at 06/09/2023 1702 Last data filed at 06/09/2023 0500 Gross per 24 hour  Intake 240 ml  Output --  Net 240 ml   Weight change:  Exam:  General:  Pt is alert, follows commands appropriately, not in acute distress HEENT: No icterus, No thrush, No neck mass, [place]/AT Cardiovascular: RRR, S1/S2, no rubs, no gallops Respiratory: scattered bilateral rales.  Bibasilar wheeze Abdomen: Soft/+BS, non tender, non distended, no guarding Extremities: No edema, No lymphangitis, No petechiae, No rashes, no synovitis   Data Reviewed: I have personally reviewed following labs and imaging studies Basic Metabolic Panel: Recent Labs  Lab  06/04/23 1823 06/05/23 0351 06/06/23 0433 06/09/23 0438  NA 136 139 139 138  K 3.3* 3.9 3.4* 3.9  CL 103 107 105 102  CO2 23 23 23 28   GLUCOSE 139* 150* 107* 138*  BUN 16 13 20  32*  CREATININE 0.93 0.93 0.97 1.20*  CALCIUM 9.5 9.8 9.7 9.8  MG  --   --  2.2 2.1   Liver Function Tests: Recent Labs  Lab  06/04/23 1823  AST 49*  ALT 31  ALKPHOS 118  BILITOT 1.0  PROT 7.1  ALBUMIN 3.8   No results for input(s): "LIPASE", "AMYLASE" in the last 168 hours. No results for input(s): "AMMONIA" in the last 168 hours. Coagulation Profile: No results for input(s): "INR", "PROTIME" in the last 168 hours. CBC: Recent Labs  Lab 06/04/23 1823 06/05/23 0351  WBC 7.7 6.7  NEUTROABS 6.7  --   HGB 12.8 12.3  HCT 40.1 38.4  MCV 101.8* 101.3*  PLT 186 208   Cardiac Enzymes: No results for input(s): "CKTOTAL", "CKMB", "CKMBINDEX", "TROPONINI" in the last 168 hours. BNP: Invalid input(s): "POCBNP" CBG: No results for input(s): "GLUCAP" in the last 168 hours. HbA1C: No results for input(s): "HGBA1C" in the last 72 hours. Urine analysis:    Component Value Date/Time   COLORURINE YELLOW 12/28/2013 1706   APPEARANCEUR CLEAR 12/28/2013 1706   LABSPEC <1.005 (L) 12/28/2013 1706   PHURINE 5.5 12/28/2013 1706   GLUCOSEU NEGATIVE 12/28/2013 1706   HGBUR NEGATIVE 12/28/2013 1706   BILIRUBINUR NEGATIVE 12/28/2013 1706   KETONESUR NEGATIVE 12/28/2013 1706   PROTEINUR NEGATIVE 12/28/2013 1706   UROBILINOGEN 0.2 12/28/2013 1706   NITRITE NEGATIVE 12/28/2013 1706   LEUKOCYTESUR NEGATIVE 12/28/2013 1706   Sepsis Labs: @LABRCNTIP (procalcitonin:4,lacticidven:4) ) Recent Results (from the past 240 hours)  Resp panel by RT-PCR (RSV, Flu A&B, Covid) Anterior Nasal Swab     Status: None   Collection Time: 06/04/23  9:01 PM   Specimen: Anterior Nasal Swab  Result Value Ref Range Status   SARS Coronavirus 2 by RT PCR NEGATIVE NEGATIVE Final    Comment: (NOTE) SARS-CoV-2 target nucleic acids are NOT DETECTED.  The SARS-CoV-2 RNA is generally detectable in upper respiratory specimens during the acute phase of infection. The lowest concentration of SARS-CoV-2 viral copies this assay can detect is 138 copies/mL. A negative result does not preclude SARS-Cov-2 infection and should not be used as the sole  basis for treatment or other patient management decisions. A negative result may occur with  improper specimen collection/handling, submission of specimen other than nasopharyngeal swab, presence of viral mutation(s) within the areas targeted by this assay, and inadequate number of viral copies(<138 copies/mL). A negative result must be combined with clinical observations, patient history, and epidemiological information. The expected result is Negative.  Fact Sheet for Patients:  BloggerCourse.com  Fact Sheet for Healthcare Providers:  SeriousBroker.it  This test is no t yet approved or cleared by the [place] FDA and  has been authorized for detection and/or diagnosis of SARS-CoV-2 by FDA under an Emergency Use Authorization (EUA). This EUA will remain  in effect (meaning this test can be used) for the duration of the COVID-19 declaration under Section 564(b)(1) of the Act, 21 U.S.C.section 360bbb-3(b)(1), unless the authorization is terminated  or revoked sooner.       Influenza A by PCR NEGATIVE NEGATIVE Final   Influenza B by PCR NEGATIVE NEGATIVE Final    Comment: (NOTE) The Xpert Xpress SARS-CoV-2/FLU/RSV plus assay is intended as an aid  in the diagnosis of influenza from Nasopharyngeal swab specimens and should not be used as a sole basis for treatment. Nasal washings and aspirates are unacceptable for Xpert Xpress SARS-CoV-2/FLU/RSV testing.  Fact Sheet for Patients: BloggerCourse.com  Fact Sheet for Healthcare Providers: SeriousBroker.it  This test is not yet approved or cleared by the [place] FDA and has been authorized for detection and/or diagnosis of SARS-CoV-2 by FDA under an Emergency Use Authorization (EUA). This EUA will remain in effect (meaning this test can be used) for the duration of the COVID-19 declaration under Section 564(b)(1) of the Act,  21 U.S.C. section 360bbb-3(b)(1), unless the authorization is terminated or revoked.     Resp Syncytial Virus by PCR NEGATIVE NEGATIVE Final    Comment: (NOTE) Fact Sheet for Patients: BloggerCourse.com  Fact Sheet for Healthcare Providers: SeriousBroker.it  This test is not yet approved or cleared by the [place] FDA and has been authorized for detection and/or diagnosis of SARS-CoV-2 by FDA under an Emergency Use Authorization (EUA). This EUA will remain in effect (meaning this test can be used) for the duration of the COVID-19 declaration under Section 564(b)(1) of the Act, 21 U.S.C. section 360bbb-3(b)(1), unless the authorization is terminated or revoked.  Performed at [facility], [address] [place], [place] [address]   Culture, blood (routine x 2) Call MD if unable to obtain prior to antibiotics being given     Status: None   Collection Time: 06/04/23  9:30 PM   Specimen: BLOOD  Result Value Ref Range Status   Specimen Description BLOOD BLOOD RIGHT HAND  Final   Special Requests BOTTLES DRAWN AEROBIC AND ANAEROBIC  Final   Culture   Final    NO GROWTH 5 DAYS Performed at [facility], [address] [place], [place] [address]    Report Status 06/09/2023 FINAL  Final  Culture, blood (Routine X 2) w Reflex to ID Panel     Status: None   Collection Time: 06/04/23  9:40 PM   Specimen: BLOOD  Result Value Ref Range Status   Specimen Description BLOOD BLOOD RIGHT HAND  Final   Special Requests BOTTLES DRAWN AEROBIC AND ANAEROBIC  Final   Culture   Final    NO GROWTH 5 DAYS Performed at [facility], [address], [place], [place] [address]    Report Status 06/09/2023 FINAL  Final     Scheduled Meds:  amiodarone  200 mg Oral Daily   arformoterol  15 mcg Nebulization BID   atorvastatin  20 mg Oral Daily   budesonide (PULMICORT) nebulizer solution  0.5 mg Nebulization BID   dapagliflozin propanediol  10 mg  Oral QAC breakfast   diltiazem  180 mg Oral Daily   DULoxetine  60 mg Oral Daily   guaiFENesin  600 mg Oral BID   ipratropium-albuterol  3 mL Nebulization Q6H   levothyroxine  125 mcg Oral q morning   lisinopril  10 mg Oral Daily   methylPREDNISolone (SOLU-MEDROL) injection  40 mg Intravenous Q12H   metoprolol succinate  12.5 mg Oral Daily   potassium chloride SA  40 mEq Oral Daily   pregabalin  75 mg Oral QHS   Rivaroxaban  15 mg Oral Q supper   torsemide  20 mg Oral Daily   Continuous Infusions:  cefTRIAXone (ROCEPHIN)  IV 2 g (06/08/23 2310)    Procedures/Studies: DG CHEST PORT 1 VIEW Result Date: 06/09/2023 CLINICAL DATA:  Multifocal pneumonia EXAM: PORTABLE CHEST 1 VIEW COMPARISON:  X-ray 06/04/2023  FINDINGS: No pneumothorax or effusion. Stable cardiopericardial silhouette calcified aorta. There is increased linear opacity in the right midlung, likely scar or atelectasis versus some thickening along the minor fissure. No edema. Subtle nodular area of opacity at the left lung base. Not clearly seen previous. IMPRESSION: Subtle nodular opacity at the left lung base. Atypical infiltrates possible SBO is not present on the recent prior. Recommend short follow-up. Increased atelectasis or thickening of the minor fissure Electronically Signed   By: Karen Kays M.D.   On: 06/09/2023 10:33   DG Chest Portable 1 View Result Date: 06/04/2023 CLINICAL DATA:  Cough, shortness of breath EXAM: PORTABLE CHEST 1 VIEW COMPARISON:  06/23/2019 FINDINGS: The heart size and mediastinal contours are within normal limits. Aortic atherosclerosis. Bilateral interstitial opacities, most pronounced within the right upper lobe and left lower lobe. No pleural effusion or pneumothorax. The visualized skeletal structures are unremarkable. IMPRESSION: Bilateral interstitial opacities, most pronounced within the right upper lobe and left lower lobe. Findings may reflect atypical/viral infection versus pulmonary edema.  Electronically Signed   By: Duanne Guess D.O.   On: 06/04/2023 17:52    Catarina Hartshorn, DO  [facility]  If 7PM-7AM, please contact night-coverage [email or web address] Password [email or web address] 06/09/2023, 5:02 PM   LOS: 4 days

## 2023-06-09 NOTE — Plan of Care (Signed)

## 2023-06-10 DIAGNOSIS — J181 Lobar pneumonia, unspecified organism: Secondary | ICD-10-CM | POA: Diagnosis not present

## 2023-06-10 DIAGNOSIS — I5032 Chronic diastolic (congestive) heart failure: Secondary | ICD-10-CM | POA: Diagnosis not present

## 2023-06-10 DIAGNOSIS — I4819 Other persistent atrial fibrillation: Secondary | ICD-10-CM | POA: Diagnosis not present

## 2023-06-10 DIAGNOSIS — J441 Chronic obstructive pulmonary disease with (acute) exacerbation: Secondary | ICD-10-CM | POA: Diagnosis not present

## 2023-06-10 LAB — BASIC METABOLIC PANEL WITH GFR
Anion gap: 9 (ref 5–15)
BUN: 42 mg/dL — ABNORMAL HIGH (ref 8–23)
CO2: 27 mmol/L (ref 22–32)
Calcium: 9.6 mg/dL (ref 8.9–10.3)
Chloride: 99 mmol/L (ref 98–111)
Creatinine, Ser: 1.39 mg/dL — ABNORMAL HIGH (ref 0.44–1.00)
GFR, Estimated: 39 mL/min — ABNORMAL LOW (ref >60)
Glucose, Bld: 127 mg/dL — ABNORMAL HIGH (ref 70–99)
Potassium: 4.7 mmol/L (ref 3.5–5.1)
Sodium: 135 mmol/L (ref 135–145)

## 2023-06-10 LAB — RESPIRATORY PANEL BY PCR

## 2023-06-10 LAB — PROCALCITONIN: Procalcitonin: <0.1 ng/mL

## 2023-06-10 LAB — MAGNESIUM: Magnesium: 2.2 mg/dL (ref 1.7–2.4)

## 2023-06-10 MED ORDER — ALBUTEROL SULFATE (2.5 MG/3ML) 0.083% IN NEBU
2.5000 mg | INHALATION_SOLUTION | Freq: Three times a day (TID) | RESPIRATORY_TRACT | Status: DC
  Administered 2023-06-11 – 2023-06-12 (×4): 2.5 mg via RESPIRATORY_TRACT
  Filled 2023-06-10 (×4): qty 3

## 2023-06-10 NOTE — Progress Notes (Signed)
 PROGRESS NOTE  Diane Cohen:[id number] DOB: [birth year] DOA: 06/04/2023 PCP: Benita Stabile, MD  Brief History:  78 y.o. female, with past medical history of atrial fibrillation, on Eliquis, chronic diastolic CHF, hypertension, TIA, hypothyroidism, hyperlipidemia, nocturnal hypoxemia on 2 L oxygen at nighttime only.  Patient presents to ED secondary to complaints of shortness of breath, and cough, reports symptoms ongoing for last 3 days, initially cough, generalized weakness and fatigue, patient took her pneumonia and RSV vaccine yesterday, patient report dyspnea over last 24 hours, cough and nasal congestion which prompted her to come to ED -In ED she was 82% on room air, she was dyspneic by EMS where she received steroids and nebulizer treatments, on 3 L oxygen, chest x-ray significant for multifocal pneumonia, [facility] consulted to admit.  Patient was started on IV ceftriaxone and azithromycin.  IV solumedrol and duonebs were added.  Patient was slow to improve.   Assessment/Plan: Acute on chronic respiratory failure with hypoxia Multifocal/Lobar pneumonia -On 2 L nasal cannula at nighttime only at home -initially requiring 3 L nasal cannula while awake, saturating 82% on room air while awake.  Chest x-ray significant for multifocal opacity. -Admitted under pneumonia pathway, follow blood cultures (neg) , sputum cultures, Legionella antigen (negative), strep pneumonia antigen (negative). -Continue with IV Rocephin and azithromycin.--finish 5 days -pt continues to cough and wheeze, added scheduled bronchodilators treatments  -changed to IV solumedrol -continue brovana and pulmicort -add Yupelri -check viral resp panel-pending  -personally reviewed CXR 4/2--bibasilar opacities   Nocturnal hypoxemia (chronic) -By reviewing pulmonary note, sleep study has been negative for OSA but significant for nocturnal hypoxemia -pt is on 2L at night at home   Periodic  limb  movement -continue with Lyrica    Paroxysmal A-fib -continue amiodarone and Cardizem -Continue with Eliquis   Chronic diastolic CHF -resumed home torsemide  -Continue with [place] -clinically euvolemic   Hypokalemia -Replaced   Hypertension -Continue with home medications -hold lisinopril due to uptrending serum creatinine   Hypothyroidism -Continue with Synthroid  CKD stage 3a -baseline creatinine 0.9-1.2      Family Communication:   no Family at bedside   Consultants:  none   Code Status:  FULL   DVT Prophylaxis:  apixaban     Procedures: As Listed in Progress Note Above   Antibiotics: Ceftriaxone 3/28>>4/2 Azithro 3/28>>4/2        Subjective: Pt states she is breathing better.  Denies f/c, cp, n/v/d, abd pain  Objective: Vitals:   06/10/23 0512 06/10/23 0751 06/10/23 0955 06/10/23 1421  BP: 116/62  (!) 119/56 (!) 114/53  Pulse: 72  80 78  Resp: 20   18  Temp: 98.8 F (37.1 C)   99.3 F (37.4 C)  TempSrc: Oral   Oral  SpO2: 95% 95% 99% 93%  Weight:      Height:        Intake/Output Summary (Last 24 hours) at 06/10/2023 1745 Last data filed at 06/10/2023 0514 Gross per 24 hour  Intake 250 ml  Output --  Net 250 ml   Weight change:  Exam:  General:  Pt is alert, follows commands appropriately, not in acute distress HEENT: No icterus, No thrush, No neck mass, [place]/AT Cardiovascular: RRR, S1/S2, no rubs, no gallops Respiratory: bibasilar rales.  No wheeze Abdomen: Soft/+BS, non tender, non distended, no guarding Extremities: No edema, No lymphangitis, No petechiae, No rashes, no synovitis   Data Reviewed: I have  personally reviewed following labs and imaging studies Basic Metabolic Panel: Recent Labs  Lab 06/04/23 1823 06/05/23 0351 06/06/23 0433 06/09/23 0438 06/10/23 0445  NA 136 139 139 138 135  K 3.3* 3.9 3.4* 3.9 4.7  CL 103 107 105 102 99  CO2 23 23 23 28 27   GLUCOSE 139* 150* 107* 138* 127*  BUN 16 13 20  32* 42*   CREATININE 0.93 0.93 0.97 1.20* 1.39*  CALCIUM 9.5 9.8 9.7 9.8 9.6  MG  --   --  2.2 2.1 2.2   Liver Function Tests: Recent Labs  Lab 06/04/23 1823  AST 49*  ALT 31  ALKPHOS 118  BILITOT 1.0  PROT 7.1  ALBUMIN 3.8   No results for input(s): "LIPASE", "AMYLASE" in the last 168 hours. No results for input(s): "AMMONIA" in the last 168 hours. Coagulation Profile: No results for input(s): "INR", "PROTIME" in the last 168 hours. CBC: Recent Labs  Lab 06/04/23 1823 06/05/23 0351  WBC 7.7 6.7  NEUTROABS 6.7  --   HGB 12.8 12.3  HCT 40.1 38.4  MCV 101.8* 101.3*  PLT 186 208   Cardiac Enzymes: No results for input(s): "CKTOTAL", "CKMB", "CKMBINDEX", "TROPONINI" in the last 168 hours. BNP: Invalid input(s): "POCBNP" CBG: Recent Labs  Lab 06/09/23 1710  GLUCAP 170*   HbA1C: No results for input(s): "HGBA1C" in the last 72 hours. Urine analysis:    Component Value Date/Time   COLORURINE YELLOW 12/28/2013 1706   APPEARANCEUR CLEAR 12/28/2013 1706   LABSPEC <1.005 (L) 12/28/2013 1706   PHURINE 5.5 12/28/2013 1706   GLUCOSEU NEGATIVE 12/28/2013 1706   HGBUR NEGATIVE 12/28/2013 1706   BILIRUBINUR NEGATIVE 12/28/2013 1706   KETONESUR NEGATIVE 12/28/2013 1706   PROTEINUR NEGATIVE 12/28/2013 1706   UROBILINOGEN 0.2 12/28/2013 1706   NITRITE NEGATIVE 12/28/2013 1706   LEUKOCYTESUR NEGATIVE 12/28/2013 1706   Sepsis Labs: @LABRCNTIP (procalcitonin:4,lacticidven:4) ) Recent Results (from the past 240 hours)  Resp panel by RT-PCR (RSV, Flu A&B, Covid) Anterior Nasal Swab     Status: None   Collection Time: 06/04/23  9:01 PM   Specimen: Anterior Nasal Swab  Result Value Ref Range Status   SARS Coronavirus 2 by RT PCR NEGATIVE NEGATIVE Final    Comment: (NOTE) SARS-CoV-2 target nucleic acids are NOT DETECTED.  The SARS-CoV-2 RNA is generally detectable in upper respiratory specimens during the acute phase of infection. The lowest concentration of SARS-CoV-2 viral  copies this assay can detect is 138 copies/mL. A negative result does not preclude SARS-Cov-2 infection and should not be used as the sole basis for treatment or other patient management decisions. A negative result may occur with  improper specimen collection/handling, submission of specimen other than nasopharyngeal swab, presence of viral mutation(s) within the areas targeted by this assay, and inadequate number of viral copies(<138 copies/mL). A negative result must be combined with clinical observations, patient history, and epidemiological information. The expected result is Negative.  Fact Sheet for Patients:  BloggerCourse.com  Fact Sheet for Healthcare Providers:  SeriousBroker.it  This test is no t yet approved or cleared by the [place] FDA and  has been authorized for detection and/or diagnosis of SARS-CoV-2 by FDA under an Emergency Use Authorization (EUA). This EUA will remain  in effect (meaning this test can be used) for the duration of the COVID-19 declaration under Section 564(b)(1) of the Act, 21 U.S.C.section 360bbb-3(b)(1), unless the authorization is terminated  or revoked sooner.       Influenza A by PCR NEGATIVE NEGATIVE  Final   Influenza B by PCR NEGATIVE NEGATIVE Final    Comment: (NOTE) The Xpert Xpress SARS-CoV-2/FLU/RSV plus assay is intended as an aid in the diagnosis of influenza from Nasopharyngeal swab specimens and should not be used as a sole basis for treatment. Nasal washings and aspirates are unacceptable for Xpert Xpress SARS-CoV-2/FLU/RSV testing.  Fact Sheet for Patients: BloggerCourse.com  Fact Sheet for Healthcare Providers: SeriousBroker.it  This test is not yet approved or cleared by the [place] FDA and has been authorized for detection and/or diagnosis of SARS-CoV-2 by FDA under an Emergency Use Authorization (EUA). This  EUA will remain in effect (meaning this test can be used) for the duration of the COVID-19 declaration under Section 564(b)(1) of the Act, 21 U.S.C. section 360bbb-3(b)(1), unless the authorization is terminated or revoked.     Resp Syncytial Virus by PCR NEGATIVE NEGATIVE Final    Comment: (NOTE) Fact Sheet for Patients: BloggerCourse.com  Fact Sheet for Healthcare Providers: SeriousBroker.it  This test is not yet approved or cleared by the [place] FDA and has been authorized for detection and/or diagnosis of SARS-CoV-2 by FDA under an Emergency Use Authorization (EUA). This EUA will remain in effect (meaning this test can be used) for the duration of the COVID-19 declaration under Section 564(b)(1) of the Act, 21 U.S.C. section 360bbb-3(b)(1), unless the authorization is terminated or revoked.  Performed at [facility], [address] [place], [place] [address]   Culture, blood (routine x 2) Call MD if unable to obtain prior to antibiotics being given     Status: None   Collection Time: 06/04/23  9:30 PM   Specimen: BLOOD  Result Value Ref Range Status   Specimen Description BLOOD BLOOD RIGHT HAND  Final   Special Requests BOTTLES DRAWN AEROBIC AND ANAEROBIC  Final   Culture   Final    NO GROWTH 5 DAYS Performed at [facility], [address] [place], [place] [address]    Report Status 06/09/2023 FINAL  Final  Culture, blood (Routine X 2) w Reflex to ID Panel     Status: None   Collection Time: 06/04/23  9:40 PM   Specimen: BLOOD  Result Value Ref Range Status   Specimen Description BLOOD BLOOD RIGHT HAND  Final   Special Requests BOTTLES DRAWN AEROBIC AND ANAEROBIC  Final   Culture   Final    NO GROWTH 5 DAYS Performed at [facility], [address], [place], [place] [address]    Report Status 06/09/2023 FINAL  Final     Scheduled Meds:  albuterol  2.5 mg Nebulization Q6H   amiodarone  200 mg Oral Daily    arformoterol  15 mcg Nebulization BID   atorvastatin  20 mg Oral Daily   budesonide (PULMICORT) nebulizer solution  0.5 mg Nebulization BID   dapagliflozin propanediol  10 mg Oral QAC breakfast   diltiazem  180 mg Oral Daily   DULoxetine  60 mg Oral Daily   levothyroxine  125 mcg Oral q morning   methylPREDNISolone (SOLU-MEDROL) injection  40 mg Intravenous Q12H   metoprolol succinate  12.5 mg Oral Daily   pregabalin  75 mg Oral QHS   revefenacin  175 mcg Nebulization Daily   Rivaroxaban  15 mg Oral Q supper   Continuous Infusions:  Procedures/Studies: DG CHEST PORT 1 VIEW Result Date: 06/09/2023 CLINICAL DATA:  Multifocal pneumonia EXAM: PORTABLE CHEST 1 VIEW COMPARISON:  X-ray 06/04/2023 FINDINGS: No pneumothorax or effusion. Stable cardiopericardial silhouette calcified aorta. There  is increased linear opacity in the right midlung, likely scar or atelectasis versus some thickening along the minor fissure. No edema. Subtle nodular area of opacity at the left lung base. Not clearly seen previous. IMPRESSION: Subtle nodular opacity at the left lung base. Atypical infiltrates possible SBO is not present on the recent prior. Recommend short follow-up. Increased atelectasis or thickening of the minor fissure Electronically Signed   By: Karen Kays M.D.   On: 06/09/2023 10:33   DG Chest Portable 1 View Result Date: 06/04/2023 CLINICAL DATA:  Cough, shortness of breath EXAM: PORTABLE CHEST 1 VIEW COMPARISON:  06/23/2019 FINDINGS: The heart size and mediastinal contours are within normal limits. Aortic atherosclerosis. Bilateral interstitial opacities, most pronounced within the right upper lobe and left lower lobe. No pleural effusion or pneumothorax. The visualized skeletal structures are unremarkable. IMPRESSION: Bilateral interstitial opacities, most pronounced within the right upper lobe and left lower lobe. Findings may reflect atypical/viral infection versus pulmonary edema. Electronically  Signed   By: Duanne Guess D.O.   On: 06/04/2023 17:52    Catarina Hartshorn, DO  [facility]  If 7PM-7AM, please contact night-coverage [email or web address] Password [email or web address] 06/10/2023, 5:45 PM   LOS: 5 days

## 2023-06-10 NOTE — Progress Notes (Signed)
 Mobility Specialist Progress Note:    06/10/23 1027  Mobility  Activity Ambulated with assistance in hallway  Level of Assistance Contact guard assist, steadying assist  Assistive Device None  Distance Ambulated (ft) 60 ft  Range of Motion/Exercises Active;All extremities  Activity Response Tolerated well  Mobility Referral Yes  Mobility visit 1 Mobility  Mobility Specialist Start Time (ACUTE ONLY) 1000  Mobility Specialist Stop Time (ACUTE ONLY) 1020  Mobility Specialist Time Calculation (min) (ACUTE ONLY) 20 min   Pt received in bed, agreeable to mobility. Required CGA to stand and ambulate with no AD. Tolerated well, SpO2 92% on RA throughout session. Denies SOB. Returned to room, left pt supine. All needs met.  Lawerance Bach Mobility Specialist Please contact via Special educational needs teacher or  Rehab office at [phone]

## 2023-06-11 DIAGNOSIS — I4891 Unspecified atrial fibrillation: Secondary | ICD-10-CM

## 2023-06-11 DIAGNOSIS — J441 Chronic obstructive pulmonary disease with (acute) exacerbation: Secondary | ICD-10-CM | POA: Diagnosis not present

## 2023-06-11 DIAGNOSIS — N170 Acute kidney failure with tubular necrosis: Secondary | ICD-10-CM | POA: Diagnosis not present

## 2023-06-11 DIAGNOSIS — I4819 Other persistent atrial fibrillation: Secondary | ICD-10-CM | POA: Diagnosis not present

## 2023-06-11 DIAGNOSIS — N179 Acute kidney failure, unspecified: Secondary | ICD-10-CM | POA: Insufficient documentation

## 2023-06-11 DIAGNOSIS — N1831 Chronic kidney disease, stage 3a: Secondary | ICD-10-CM

## 2023-06-11 LAB — BASIC METABOLIC PANEL WITH GFR
Anion gap: 9 (ref 5–15)
BUN: 47 mg/dL — ABNORMAL HIGH (ref 8–23)
CO2: 29 mmol/L (ref 22–32)
Calcium: 9.2 mg/dL (ref 8.9–10.3)
Chloride: 97 mmol/L — ABNORMAL LOW (ref 98–111)
Creatinine, Ser: 1.67 mg/dL — ABNORMAL HIGH (ref 0.44–1.00)
GFR, Estimated: 31 mL/min — ABNORMAL LOW (ref >60)
Glucose, Bld: 141 mg/dL — ABNORMAL HIGH (ref 70–99)
Potassium: 4.2 mmol/L (ref 3.5–5.1)
Sodium: 135 mmol/L (ref 135–145)

## 2023-06-11 MED ORDER — SODIUM CHLORIDE 0.9 % IV SOLN: INTRAVENOUS | Status: AC

## 2023-06-11 NOTE — Plan of Care (Signed)

## 2023-06-11 NOTE — Progress Notes (Addendum)
 PROGRESS NOTE  Diane Cohen XLK:[id number] DOB: 1945-05-09 DOA: 06/04/2023 PCP: Benita Stabile, MD  Brief History:  78 y.o. female, with past medical history of atrial fibrillation, on Eliquis, chronic diastolic CHF, hypertension, TIA, hypothyroidism, hyperlipidemia, nocturnal hypoxemia on 2 L oxygen at nighttime only.  Patient presents to ED secondary to complaints of shortness of breath, and cough, reports symptoms ongoing for last 3 days, initially cough, generalized weakness and fatigue, patient took her pneumonia and RSV vaccine yesterday, patient report dyspnea over last 24 hours, cough and nasal congestion which prompted her to come to ED -In ED she was 82% on room air, she was dyspneic by EMS where she received steroids and nebulizer treatments, on 3 L oxygen, chest x-ray significant for multifocal pneumonia, [facility] consulted to admit.  Patient was started on IV ceftriaxone and azithromycin.  IV solumedrol and duonebs were added.  Patient was slow to improve.   Assessment/Plan: Acute on chronic respiratory failure with hypoxia Multifocal/Lobar pneumonia -On 2 L nasal cannula at nighttime only at home -initially requiring 3 L nasal cannula while awake, saturating 82% on room air while awake.  Chest x-ray significant for multifocal opacity. -Admitted under pneumonia pathway, follow blood cultures (neg) , sputum cultures, Legionella antigen (negative), strep pneumonia antigen (negative). -Continue with IV Rocephin and azithromycin.--finished 5 days -pt continues to cough and wheeze, added scheduled bronchodilators treatments  -changed to IV solumedrol -continue brovana and pulmicort -add Yupelri -check viral resp panel-POS rhinovirus/enterovirus -personally reviewed CXR 4/2--bibasilar opacities   Nocturnal hypoxemia (chronic) -By reviewing pulmonary note, sleep study has been negative for OSA but significant for nocturnal hypoxemia -pt is on 2L at night at home    Periodic  limb movement -continue with Lyrica    Paroxysmal A-fib -continue metoprolol succinate, amiodarone and Cardizem -Continue with Eliquis   Chronic diastolic CHF -resumed home torsemide  -Continue with [place] -clinically euvolemic   Hypokalemia -Replaced   Hypertension -Continue with home medications -hold lisinopril due to uptrending serum creatinine   Hypothyroidism -Continue with Synthroid   Acute on CKD stage 3a -baseline creatinine 0.9-1.2 -serum creatinine peaking 1.67 -due to volume depletion and lisinopril -d/c lisinopril and torsemide -give judicious IV fluids x 12 hr       Family Communication:   no Family at bedside   Consultants:  none   Code Status:  FULL   DVT Prophylaxis:  apixaban     Procedures: As Listed in Progress Note Above   Antibiotics: Ceftriaxone 3/28>>4/2 Azithro 3/28>>4/2          Subjective: Pt is breathing better.  Has a largely dry cough.  Denies f/c, cp, abd pain, n/v/d  Objective: Vitals:   06/11/23 0856 06/11/23 0906 06/11/23 1427 06/11/23 1523  BP:   (!) 123/58   Pulse:   74   Resp:   16   Temp:   98.4 F (36.9 C)   TempSrc:   Oral   SpO2: 100% 100% 97% 93%  Weight:      Height:        Intake/Output Summary (Last 24 hours) at 06/11/2023 1751 Last data filed at 06/11/2023 1441 Gross per 24 hour  Intake 720 ml  Output --  Net 720 ml   Weight change:  Exam:  General:  Pt is alert, follows commands appropriately, not in acute distress HEENT: No icterus, No thrush, No neck mass, [place]/AT Cardiovascular: IRRR, S1/S2, no rubs, no gallops Respiratory: bibasilar  rales.  No wheeze Abdomen: Soft/+BS, non tender, non distended, no guarding Extremities: No edema, No lymphangitis, No petechiae, No rashes, no synovitis   Data Reviewed: I have personally reviewed following labs and imaging studies Basic Metabolic Panel: Recent Labs  Lab 06/05/23 0351 06/06/23 0433 06/09/23 0438 06/10/23 0445  06/11/23 0451  NA 139 139 138 135 135  K 3.9 3.4* 3.9 4.7 4.2  CL 107 105 102 99 97*  CO2 23 23 28 27 29   GLUCOSE 150* 107* 138* 127* 141*  BUN 13 20 32* 42* 47*  CREATININE 0.93 0.97 1.20* 1.39* 1.67*  CALCIUM 9.8 9.7 9.8 9.6 9.2  MG  --  2.2 2.1 2.2  --    Liver Function Tests: Recent Labs  Lab 06/04/23 1823  AST 49*  ALT 31  ALKPHOS 118  BILITOT 1.0  PROT 7.1  ALBUMIN 3.8   No results for input(s): "LIPASE", "AMYLASE" in the last 168 hours. No results for input(s): "AMMONIA" in the last 168 hours. Coagulation Profile: No results for input(s): "INR", "PROTIME" in the last 168 hours. CBC: Recent Labs  Lab 06/04/23 1823 06/05/23 0351  WBC 7.7 6.7  NEUTROABS 6.7  --   HGB 12.8 12.3  HCT 40.1 38.4  MCV 101.8* 101.3*  PLT 186 208   Cardiac Enzymes: No results for input(s): "CKTOTAL", "CKMB", "CKMBINDEX", "TROPONINI" in the last 168 hours. BNP: Invalid input(s): "POCBNP" CBG: Recent Labs  Lab 06/09/23 1710  GLUCAP 170*   HbA1C: No results for input(s): "HGBA1C" in the last 72 hours. Urine analysis:    Component Value Date/Time   COLORURINE YELLOW 12/28/2013 1706   APPEARANCEUR CLEAR 12/28/2013 1706   LABSPEC <1.005 (L) 12/28/2013 1706   PHURINE 5.5 12/28/2013 1706   GLUCOSEU NEGATIVE 12/28/2013 1706   HGBUR NEGATIVE 12/28/2013 1706   BILIRUBINUR NEGATIVE 12/28/2013 1706   KETONESUR NEGATIVE 12/28/2013 1706   PROTEINUR NEGATIVE 12/28/2013 1706   UROBILINOGEN 0.2 12/28/2013 1706   NITRITE NEGATIVE 12/28/2013 1706   LEUKOCYTESUR NEGATIVE 12/28/2013 1706   Sepsis Labs: @LABRCNTIP (procalcitonin:4,lacticidven:4) ) Recent Results (from the past 240 hours)  Resp panel by RT-PCR (RSV, Flu A&B, Covid) Anterior Nasal Swab     Status: None   Collection Time: 06/04/23  9:01 PM   Specimen: Anterior Nasal Swab  Result Value Ref Range Status   SARS Coronavirus 2 by RT PCR NEGATIVE NEGATIVE Final    Comment: (NOTE) SARS-CoV-2 target nucleic acids are NOT  DETECTED.  The SARS-CoV-2 RNA is generally detectable in upper respiratory specimens during the acute phase of infection. The lowest concentration of SARS-CoV-2 viral copies this assay can detect is 138 copies/mL. A negative result does not preclude SARS-Cov-2 infection and should not be used as the sole basis for treatment or other patient management decisions. A negative result may occur with  improper specimen collection/handling, submission of specimen other than nasopharyngeal swab, presence of viral mutation(s) within the areas targeted by this assay, and inadequate number of viral copies(<138 copies/mL). A negative result must be combined with clinical observations, patient history, and epidemiological information. The expected result is Negative.  Fact Sheet for Patients:  BloggerCourse.com  Fact Sheet for Healthcare Providers:  SeriousBroker.it  This test is no t yet approved or cleared by the [place] FDA and  has been authorized for detection and/or diagnosis of SARS-CoV-2 by FDA under an Emergency Use Authorization (EUA). This EUA will remain  in effect (meaning this test can be used) for the duration of the COVID-19 declaration under  Section 564(b)(1) of the Act, 21 U.S.C.section 360bbb-3(b)(1), unless the authorization is terminated  or revoked sooner.       Influenza A by PCR NEGATIVE NEGATIVE Final   Influenza B by PCR NEGATIVE NEGATIVE Final    Comment: (NOTE) The Xpert Xpress SARS-CoV-2/FLU/RSV plus assay is intended as an aid in the diagnosis of influenza from Nasopharyngeal swab specimens and should not be used as a sole basis for treatment. Nasal washings and aspirates are unacceptable for Xpert Xpress SARS-CoV-2/FLU/RSV testing.  Fact Sheet for Patients: BloggerCourse.com  Fact Sheet for Healthcare Providers: SeriousBroker.it  This test is not yet  approved or cleared by the [place] FDA and has been authorized for detection and/or diagnosis of SARS-CoV-2 by FDA under an Emergency Use Authorization (EUA). This EUA will remain in effect (meaning this test can be used) for the duration of the COVID-19 declaration under Section 564(b)(1) of the Act, 21 U.S.C. section 360bbb-3(b)(1), unless the authorization is terminated or revoked.     Resp Syncytial Virus by PCR NEGATIVE NEGATIVE Final    Comment: (NOTE) Fact Sheet for Patients: BloggerCourse.com  Fact Sheet for Healthcare Providers: SeriousBroker.it  This test is not yet approved or cleared by the [place] FDA and has been authorized for detection and/or diagnosis of SARS-CoV-2 by FDA under an Emergency Use Authorization (EUA). This EUA will remain in effect (meaning this test can be used) for the duration of the COVID-19 declaration under Section 564(b)(1) of the Act, 21 U.S.C. section 360bbb-3(b)(1), unless the authorization is terminated or revoked.  Performed at [facility], [address], [place], [place] [address]   Culture, blood (routine x 2) Call MD if unable to obtain prior to antibiotics being given     Status: None   Collection Time: 06/04/23  9:30 PM   Specimen: BLOOD  Result Value Ref Range Status   Specimen Description BLOOD BLOOD RIGHT HAND  Final   Special Requests BOTTLES DRAWN AEROBIC AND ANAEROBIC  Final   Culture   Final    NO GROWTH 5 DAYS Performed at [facility], [address] [place], [place] [address]    Report Status 06/09/2023 FINAL  Final  Culture, blood (Routine X 2) w Reflex to ID Panel     Status: None   Collection Time: 06/04/23  9:40 PM   Specimen: BLOOD  Result Value Ref Range Status   Specimen Description BLOOD BLOOD RIGHT HAND  Final   Special Requests BOTTLES DRAWN AEROBIC AND ANAEROBIC  Final   Culture   Final    NO GROWTH 5 DAYS Performed at [facility],  [address] [place], [place] [address]    Report Status 06/09/2023 FINAL  Final  Respiratory (~20 pathogens) panel by PCR     Status: Abnormal   Collection Time: 06/10/23 11:40 AM   Specimen: Nasopharyngeal Swab; Respiratory  Result Value Ref Range Status   Adenovirus NOT DETECTED NOT DETECTED Final   Coronavirus 229E NOT DETECTED NOT DETECTED Final    Comment: (NOTE) The Coronavirus on the Respiratory Panel, DOES NOT test for the novel  Coronavirus (2019 nCoV)    Coronavirus HKU1 NOT DETECTED NOT DETECTED Final   Coronavirus NL63 NOT DETECTED NOT DETECTED Final   Coronavirus OC43 NOT DETECTED NOT DETECTED Final   Metapneumovirus NOT DETECTED NOT DETECTED Final   Rhinovirus / Enterovirus DETECTED (A) NOT DETECTED Final   Influenza A NOT DETECTED NOT DETECTED Final   Influenza B NOT DETECTED NOT DETECTED Final   Parainfluenza  Virus 1 NOT DETECTED NOT DETECTED Final   Parainfluenza Virus 2 NOT DETECTED NOT DETECTED Final   Parainfluenza Virus 3 NOT DETECTED NOT DETECTED Final   Parainfluenza Virus 4 NOT DETECTED NOT DETECTED Final   Respiratory Syncytial Virus NOT DETECTED NOT DETECTED Final   Bordetella pertussis NOT DETECTED NOT DETECTED Final   Bordetella Parapertussis NOT DETECTED NOT DETECTED Final   Chlamydophila pneumoniae NOT DETECTED NOT DETECTED Final   Mycoplasma pneumoniae NOT DETECTED NOT DETECTED Final    Comment: Performed at [facility], [address] [place], [place] [address]     Scheduled Meds:  albuterol  2.5 mg Nebulization TID   amiodarone  200 mg Oral Daily   arformoterol  15 mcg Nebulization BID   atorvastatin  20 mg Oral Daily   budesonide (PULMICORT) nebulizer solution  0.5 mg Nebulization BID   dapagliflozin propanediol  10 mg Oral QAC breakfast   diltiazem  180 mg Oral Daily   DULoxetine  60 mg Oral Daily   levothyroxine  125 mcg Oral q morning   methylPREDNISolone (SOLU-MEDROL) injection  40 mg Intravenous Q12H   metoprolol succinate  12.5 mg  Oral Daily   pregabalin  75 mg Oral QHS   revefenacin  175 mcg Nebulization Daily   Rivaroxaban  15 mg Oral Q supper   Continuous Infusions:  sodium chloride      Procedures/Studies: DG CHEST PORT 1 VIEW Result Date: 06/09/2023 CLINICAL DATA:  Multifocal pneumonia EXAM: PORTABLE CHEST 1 VIEW COMPARISON:  X-ray 06/04/2023 FINDINGS: No pneumothorax or effusion. Stable cardiopericardial silhouette calcified aorta. There is increased linear opacity in the right midlung, likely scar or atelectasis versus some thickening along the minor fissure. No edema. Subtle nodular area of opacity at the left lung base. Not clearly seen previous. IMPRESSION: Subtle nodular opacity at the left lung base. Atypical infiltrates possible SBO is not present on the recent prior. Recommend short follow-up. Increased atelectasis or thickening of the minor fissure Electronically Signed   By: Karen Kays M.D.   On: 06/09/2023 10:33   DG Chest Portable 1 View Result Date: 06/04/2023 CLINICAL DATA:  Cough, shortness of breath EXAM: PORTABLE CHEST 1 VIEW COMPARISON:  06/23/2019 FINDINGS: The heart size and mediastinal contours are within normal limits. Aortic atherosclerosis. Bilateral interstitial opacities, most pronounced within the right upper lobe and left lower lobe. No pleural effusion or pneumothorax. The visualized skeletal structures are unremarkable. IMPRESSION: Bilateral interstitial opacities, most pronounced within the right upper lobe and left lower lobe. Findings may reflect atypical/viral infection versus pulmonary edema. Electronically Signed   By: Duanne Guess D.O.   On: 06/04/2023 17:52    Catarina Hartshorn, DO  [facility]  If 7PM-7AM, please contact night-coverage [email or web address] Password [email or web address] 06/11/2023, 5:51 PM   LOS: 6 days

## 2023-06-11 NOTE — Progress Notes (Signed)
 Mobility Specialist Progress Note:    06/11/23 1015  Mobility  Activity Ambulated with assistance in hallway  Level of Assistance Standby assist, set-up cues, supervision of patient - no hands on  Assistive Device None  Distance Ambulated (ft) 65 ft  Range of Motion/Exercises Active;All extremities  Activity Response Tolerated well  Mobility Referral Yes  Mobility visit 1 Mobility  Mobility Specialist Start Time (ACUTE ONLY) 1015  Mobility Specialist Stop Time (ACUTE ONLY) 1037  Mobility Specialist Time Calculation (min) (ACUTE ONLY) 22 min   Pt received in bed, agreeable to mobility. Required SBA to stand and ambulate with no AD. Tolerated well, SpO2 91-93% on RA during ambulation. Left pt sitting EOB, all needs met.  Asiyah Pineau Mobility Specialist Please contact via Special educational needs teacher or  Rehab office at [phone]

## 2023-06-11 NOTE — Care Management Important Message (Signed)
 Important Message  Patient Details  Name: Diane Cohen MRN: [id number] Date of Birth: [birth year]   Important Message Given:  Yes - Medicare IM     Corey Harold 06/11/2023, 5:21 PM

## 2023-06-12 DIAGNOSIS — I4819 Other persistent atrial fibrillation: Secondary | ICD-10-CM | POA: Diagnosis not present

## 2023-06-12 DIAGNOSIS — J441 Chronic obstructive pulmonary disease with (acute) exacerbation: Secondary | ICD-10-CM | POA: Diagnosis not present

## 2023-06-12 DIAGNOSIS — I5032 Chronic diastolic (congestive) heart failure: Secondary | ICD-10-CM | POA: Diagnosis not present

## 2023-06-12 DIAGNOSIS — I10 Essential (primary) hypertension: Secondary | ICD-10-CM | POA: Diagnosis not present

## 2023-06-12 LAB — BASIC METABOLIC PANEL WITH GFR
Anion gap: 8 (ref 5–15)
BUN: 41 mg/dL — ABNORMAL HIGH (ref 8–23)
CO2: 27 mmol/L (ref 22–32)
Calcium: 9.3 mg/dL (ref 8.9–10.3)
Chloride: 102 mmol/L (ref 98–111)
Creatinine, Ser: 1.18 mg/dL — ABNORMAL HIGH (ref 0.44–1.00)
GFR, Estimated: 48 mL/min — ABNORMAL LOW (ref >60)
Glucose, Bld: 119 mg/dL — ABNORMAL HIGH (ref 70–99)
Potassium: 4.6 mmol/L (ref 3.5–5.1)
Sodium: 137 mmol/L (ref 135–145)

## 2023-06-12 MED ORDER — IPRATROPIUM-ALBUTEROL 0.5-2.5 (3) MG/3ML IN SOLN: 3.0000 mL | Freq: Four times a day (QID) | RESPIRATORY_TRACT | 1 refills | Status: AC | PRN

## 2023-06-12 MED ORDER — PREDNISONE 10 MG PO TABS: 50.0000 mg | ORAL_TABLET | Freq: Every day | ORAL | 0 refills | Status: DC

## 2023-06-12 MED ORDER — IPRATROPIUM-ALBUTEROL 0.5-2.5 (3) MG/3ML IN SOLN: 3.0000 mL | Freq: Four times a day (QID) | RESPIRATORY_TRACT | Status: DC | PRN

## 2023-06-12 MED ORDER — IPRATROPIUM BROMIDE 0.06 % NA SOLN: 2.0000 | Freq: Three times a day (TID) | NASAL | 0 refills | Status: DC

## 2023-06-12 MED ORDER — PREDNISONE 20 MG PO TABS: 50.0000 mg | ORAL_TABLET | Freq: Every day | ORAL | Status: DC

## 2023-06-12 MED ORDER — IPRATROPIUM BROMIDE 0.06 % NA SOLN
2.0000 | Freq: Three times a day (TID) | NASAL | Status: DC
  Filled 2023-06-12: qty 15

## 2023-06-12 NOTE — Progress Notes (Signed)
 Mobility Specialist Progress Note:    06/12/23 1000  Mobility  Activity Ambulated with assistance in hallway  Level of Assistance Standby assist, set-up cues, supervision of patient - no hands on  Assistive Device None  Distance Ambulated (ft) 100 ft  Range of Motion/Exercises Active;All extremities  Activity Response Tolerated well  Mobility Referral Yes  Mobility visit 1 Mobility  Mobility Specialist Start Time (ACUTE ONLY) 1000  Mobility Specialist Stop Time (ACUTE ONLY) 1025  Mobility Specialist Time Calculation (min) (ACUTE ONLY) 25 min   Pt received in bed, agreeable to mobility. Required SBA to stand and ambulate with no AD. Tolerated well, SpO2 91-93% on RA during ambulation. Left pt sitting EOB, all needs met.  Randall Colden Mobility Specialist Please contact via Special educational needs teacher or  Rehab office at [phone]

## 2023-06-12 NOTE — Progress Notes (Signed)
   06/12/23 1428  TOC Discharge Assessment  Once discharged, how will the patient get to their discharge location? Family/Friend - Partnered Transport  Has discharge transport plan been identified? Yes  Barriers to Discharge No Barriers Identified   Patient discharge. Orders for outpatient PT. CM spoke with patient at bedside. Patient stated she has everything she needs for discharge. No TOC needs have been identified at this  time.

## 2023-06-12 NOTE — Discharge Summary (Addendum)
 Physician Discharge Summary   Patient: Diane Cohen MRN: [id number] DOB: 03-25-1945  Admit date:     06/04/2023  Discharge date: 06/12/23  Discharge Physician: Onalee Hua Clete Kuch   PCP: Benita Stabile, MD   Recommendations at discharge:   Please follow up with primary care provider within 1-2 weeks  Please repeat BMP and CBC in one week     Hospital Course: 78 y.o. female, with past medical history of atrial fibrillation, on Eliquis, chronic diastolic CHF, hypertension, TIA, hypothyroidism, hyperlipidemia, nocturnal hypoxemia on 2 L oxygen at nighttime only.  Patient presents to ED secondary to complaints of shortness of breath, and cough, reports symptoms ongoing for last 3 days, initially cough, generalized weakness and fatigue, patient took her pneumonia and RSV vaccine yesterday, patient report dyspnea over last 24 hours, cough and nasal congestion which prompted her to come to ED -In ED she was 82% on room air, she was dyspneic by EMS where she received steroids and nebulizer treatments, on 3 L oxygen, chest x-ray significant for multifocal pneumonia, [facility]ist consulted to admit.  Patient was started on IV ceftriaxone and azithromycin.  IV solumedrol and duonebs were added.  Patient was slow to improve.  Assessment and Plan: Acute on chronic respiratory failure with hypoxia Multifocal/Lobar pneumonia -On 2 L nasal cannula at nighttime only at home -initially requiring 3 L nasal cannula while awake, saturating 82% on room air while awake.  Chest x-ray significant for multifocal opacity. -Admitted under pneumonia pathway, follow blood cultures (neg) , sputum cultures, Legionella antigen (negative), strep pneumonia antigen (negative). -Continue with IV Rocephin and azithromycin.--finished 5 days -pt continues to cough and wheeze, added scheduled bronchodilators treatments  -changed to IV solumedrol>>d/c home with prednisone taper -continue brovana and pulmicort -added Yupelri -check  viral resp panel-POS rhinovirus/enterovirus -personally reviewed CXR 4/2--bibasilar opacities -06/12/23 ambulated on RA>>no desaturation <91%   Nocturnal hypoxemia (chronic) -By reviewing pulmonary note, sleep study has been negative for OSA but significant for nocturnal hypoxemia -pt is on 2L at night at home   Periodic  limb movement -continue with Lyrica    Paroxysmal A-fib -continue metoprolol succinate, amiodarone and Cardizem -Continue with Eliquis -rate controlled   Chronic diastolic CHF -resumed home torsemide  -Continue with Farxiga -clinically euvolemic   Hypokalemia -Replaced   Hypertension -Continue with home medications--metoprolol succinate and dilt CD -hold lisinopril due to uptrending serum creatinine -will not restart lisinopril--BP remains well controlled   Hypothyroidism -Continue with Synthroid   Acute on CKD stage 3a -baseline creatinine 0.9-1.2 -serum creatinine peaking 1.67 -due to volume depletion and lisinopril -d/c lisinopril and torsemide -give judicious IV fluids x 12 hr>>improved -serum creatinine 1.18 on day of d/c      Consultants: none Procedures performed: none  Disposition: Home Diet recommendation:  Cardiac diet DISCHARGE MEDICATION: Allergies as of 06/12/2023       Reactions   Levaquin [levofloxacin] Other (See Comments)   Headache    Sulfa Antibiotics Hives        Medication List     STOP taking these medications    acetaminophen 500 MG tablet Commonly known as: TYLENOL   acetaminophen 650 MG CR tablet Commonly known as: TYLENOL   lisinopril 10 MG tablet Commonly known as: ZESTRIL       TAKE these medications    amiodarone 200 MG tablet Commonly known as: PACERONE TAKE 1 TABLET(200 MG) BY MOUTH DAILY   atorvastatin 20 MG tablet Commonly known as: LIPITOR Take 20 mg by mouth daily.  dapagliflozin propanediol 10 MG Tabs tablet Commonly known as: FARXIGA Take 1 tablet (10 mg total) by mouth daily  before breakfast.   diltiazem 180 MG 24 hr capsule Commonly known as: CARDIZEM CD TAKE 1 CAPSULE(180 MG) BY MOUTH DAILY   DULoxetine 60 MG capsule Commonly known as: CYMBALTA Take 1 capsule (60 mg total) by mouth daily.   Fusion Plus Caps Take 1 capsule by mouth daily.   ipratropium 0.06 % nasal spray Commonly known as: ATROVENT Place 2 sprays into both nostrils 3 (three) times daily.   ipratropium-albuterol 0.5-2.5 (3) MG/3ML Soln Commonly known as: DUONEB Take 3 mLs by nebulization every 6 (six) hours as needed.   levothyroxine 125 MCG tablet Commonly known as: SYNTHROID Take 125 mcg by mouth every morning.   levothyroxine 137 MCG tablet Commonly known as: SYNTHROID Take 137 mcg by mouth daily.   metoprolol succinate 25 MG 24 hr tablet Commonly known as: Toprol XL Take 1 tablet (25 mg total) by mouth daily. What changed: how much to take   potassium chloride SA 20 MEQ tablet Commonly known as: KLOR-CON M TAKE 1 TABLET(20 MEQ) BY MOUTH DAILY   predniSONE 10 MG tablet Commonly known as: DELTASONE Take 5 tablets (50 mg total) by mouth daily with breakfast. And decrease by one tablet daily Start taking on: June 13, 2023   pregabalin 75 MG capsule Commonly known as: Lyrica Take 1 capsule (75 mg total) by mouth at bedtime. For restless leg symptoms   torsemide 20 MG tablet Commonly known as: DEMADEX Take 1 tablet (20 mg total) by mouth daily.   Xarelto 15 MG Tabs tablet Generic drug: Rivaroxaban TAKE 1 TABLET(15 MG) BY MOUTH DAILY WITH SUPPER               Durable Medical Equipment  (From admission, onward)           Start     Ordered   06/07/23 0826  For home use only DME Nebulizer machine  Once       Question Answer Comment  Patient needs a nebulizer to treat with the following condition COPD (chronic obstructive pulmonary disease) (HCC)   Length of Need Lifetime   Additional equipment included Administration kit      06/07/23 0825             Follow-up Information     [facility] Follow up.   Why: Neb machine               Discharge Exam: Filed Weights   06/04/23 1658  Weight: 80.7 kg   HEENT:  [place]/AT, No thrush, no icterus CV:  IRRR, no rub, no S3, no S4 Lung: bibasilar crackles.  No wheeze Abd:  soft/+BS, NT Ext:  No edema, no lymphangitis, no synovitis, no rash   Condition at discharge: stable  The results of significant diagnostics from this hospitalization (including imaging, microbiology, ancillary and laboratory) are listed below for reference.   Imaging Studies: DG CHEST PORT 1 VIEW Result Date: 06/09/2023 CLINICAL DATA:  Multifocal pneumonia EXAM: PORTABLE CHEST 1 VIEW COMPARISON:  X-ray 06/04/2023 FINDINGS: No pneumothorax or effusion. Stable cardiopericardial silhouette calcified aorta. There is increased linear opacity in the right midlung, likely scar or atelectasis versus some thickening along the minor fissure. No edema. Subtle nodular area of opacity at the left lung base. Not clearly seen previous. IMPRESSION: Subtle nodular opacity at the left lung base. Atypical infiltrates possible SBO is not present on the recent prior. Recommend short  follow-up. Increased atelectasis or thickening of the minor fissure Electronically Signed   By: Karen Kays M.D.   On: 06/09/2023 10:33   DG Chest Portable 1 View Result Date: 06/04/2023 CLINICAL DATA:  Cough, shortness of breath EXAM: PORTABLE CHEST 1 VIEW COMPARISON:  06/23/2019 FINDINGS: The heart size and mediastinal contours are within normal limits. Aortic atherosclerosis. Bilateral interstitial opacities, most pronounced within the right upper lobe and left lower lobe. No pleural effusion or pneumothorax. The visualized skeletal structures are unremarkable. IMPRESSION: Bilateral interstitial opacities, most pronounced within the right upper lobe and left lower lobe. Findings may reflect atypical/viral infection versus pulmonary edema.  Electronically Signed   By: Duanne Guess D.O.   On: 06/04/2023 17:52    Microbiology: Results for orders placed or performed during the hospital encounter of 06/04/23  Resp panel by RT-PCR (RSV, Flu A&B, Covid) Anterior Nasal Swab     Status: None   Collection Time: 06/04/23  9:01 PM   Specimen: Anterior Nasal Swab  Result Value Ref Range Status   SARS Coronavirus 2 by RT PCR NEGATIVE NEGATIVE Final    Comment: (NOTE) SARS-CoV-2 target nucleic acids are NOT DETECTED.  The SARS-CoV-2 RNA is generally detectable in upper respiratory specimens during the acute phase of infection. The lowest concentration of SARS-CoV-2 viral copies this assay can detect is 138 copies/mL. A negative result does not preclude SARS-Cov-2 infection and should not be used as the sole basis for treatment or other patient management decisions. A negative result may occur with  improper specimen collection/handling, submission of specimen other than nasopharyngeal swab, presence of viral mutation(s) within the areas targeted by this assay, and inadequate number of viral copies(<138 copies/mL). A negative result must be combined with clinical observations, patient history, and epidemiological information. The expected result is Negative.  Fact Sheet for Patients:  BloggerCourse.com  Fact Sheet for Healthcare Providers:  SeriousBroker.it  This test is no t yet approved or cleared by the [place] FDA and  has been authorized for detection and/or diagnosis of SARS-CoV-2 by FDA under an Emergency Use Authorization (EUA). This EUA will remain  in effect (meaning this test can be used) for the duration of the COVID-19 declaration under Section 564(b)(1) of the Act, 21 U.S.C.section 360bbb-3(b)(1), unless the authorization is terminated  or revoked sooner.       Influenza A by PCR NEGATIVE NEGATIVE Final   Influenza B by PCR NEGATIVE NEGATIVE Final     Comment: (NOTE) The Xpert Xpress SARS-CoV-2/FLU/RSV plus assay is intended as an aid in the diagnosis of influenza from Nasopharyngeal swab specimens and should not be used as a sole basis for treatment. Nasal washings and aspirates are unacceptable for Xpert Xpress SARS-CoV-2/FLU/RSV testing.  Fact Sheet for Patients: BloggerCourse.com  Fact Sheet for Healthcare Providers: SeriousBroker.it  This test is not yet approved or cleared by the [place] FDA and has been authorized for detection and/or diagnosis of SARS-CoV-2 by FDA under an Emergency Use Authorization (EUA). This EUA will remain in effect (meaning this test can be used) for the duration of the COVID-19 declaration under Section 564(b)(1) of the Act, 21 U.S.C. section 360bbb-3(b)(1), unless the authorization is terminated or revoked.     Resp Syncytial Virus by PCR NEGATIVE NEGATIVE Final    Comment: (NOTE) Fact Sheet for Patients: BloggerCourse.com  Fact Sheet for Healthcare Providers: SeriousBroker.it  This test is not yet approved or cleared by the [place] FDA and has been authorized for detection and/or diagnosis of  SARS-CoV-2 by FDA under an Emergency Use Authorization (EUA). This EUA will remain in effect (meaning this test can be used) for the duration of the COVID-19 declaration under Section 564(b)(1) of the Act, 21 U.S.C. section 360bbb-3(b)(1), unless the authorization is terminated or revoked.  Performed at [facility], [address] [place], [place] [address]   Culture, blood (routine x 2) Call MD if unable to obtain prior to antibiotics being given     Status: None   Collection Time: 06/04/23  9:30 PM   Specimen: BLOOD  Result Value Ref Range Status   Specimen Description BLOOD BLOOD RIGHT HAND  Final   Special Requests BOTTLES DRAWN AEROBIC AND ANAEROBIC  Final   Culture   Final    NO  GROWTH 5 DAYS Performed at [facility], [address], [place], [place] [address]    Report Status 06/09/2023 FINAL  Final  Culture, blood (Routine X 2) w Reflex to ID Panel     Status: None   Collection Time: 06/04/23  9:40 PM   Specimen: BLOOD  Result Value Ref Range Status   Specimen Description BLOOD BLOOD RIGHT HAND  Final   Special Requests BOTTLES DRAWN AEROBIC AND ANAEROBIC  Final   Culture   Final    NO GROWTH 5 DAYS Performed at [facility], [address] [place], [place] [address]    Report Status 06/09/2023 FINAL  Final  Respiratory (~20 pathogens) panel by PCR     Status: Abnormal   Collection Time: 06/10/23 11:40 AM   Specimen: Nasopharyngeal Swab; Respiratory  Result Value Ref Range Status   Adenovirus NOT DETECTED NOT DETECTED Final   Coronavirus 229E NOT DETECTED NOT DETECTED Final    Comment: (NOTE) The Coronavirus on the Respiratory Panel, DOES NOT test for the novel  Coronavirus (2019 nCoV)    Coronavirus HKU1 NOT DETECTED NOT DETECTED Final   Coronavirus NL63 NOT DETECTED NOT DETECTED Final   Coronavirus OC43 NOT DETECTED NOT DETECTED Final   Metapneumovirus NOT DETECTED NOT DETECTED Final   Rhinovirus / Enterovirus DETECTED (A) NOT DETECTED Final   Influenza A NOT DETECTED NOT DETECTED Final   Influenza B NOT DETECTED NOT DETECTED Final   Parainfluenza Virus 1 NOT DETECTED NOT DETECTED Final   Parainfluenza Virus 2 NOT DETECTED NOT DETECTED Final   Parainfluenza Virus 3 NOT DETECTED NOT DETECTED Final   Parainfluenza Virus 4 NOT DETECTED NOT DETECTED Final   Respiratory Syncytial Virus NOT DETECTED NOT DETECTED Final   Bordetella pertussis NOT DETECTED NOT DETECTED Final   Bordetella Parapertussis NOT DETECTED NOT DETECTED Final   Chlamydophila pneumoniae NOT DETECTED NOT DETECTED Final   Mycoplasma pneumoniae NOT DETECTED NOT DETECTED Final    Comment: Performed at [facility], [address], [place], [place] [address]    Labs: CBC: No  results for input(s): "WBC", "NEUTROABS", "HGB", "HCT", "MCV", "PLT" in the last 168 hours. Basic Metabolic Panel: Recent Labs  Lab 06/06/23 0433 06/09/23 0438 06/10/23 0445 06/11/23 0451 06/12/23 0521  NA 139 138 135 135 137  K 3.4* 3.9 4.7 4.2 4.6  CL 105 102 99 97* 102  CO2 23 28 27 29 27   GLUCOSE 107* 138* 127* 141* 119*  BUN 20 32* 42* 47* 41*  CREATININE 0.97 1.20* 1.39* 1.67* 1.18*  CALCIUM 9.7 9.8 9.6 9.2 9.3  MG 2.2 2.1 2.2  --   --    Liver Function Tests: No results for input(s): "AST", "ALT", "ALKPHOS", "BILITOT", "PROT", "ALBUMIN"  in the last 168 hours. CBG: Recent Labs  Lab 06/09/23 1710  GLUCAP 170*    Discharge time spent: greater than 30 minutes.  Signed: Catarina Hartshorn, MD [facility] 06/12/2023

## 2023-06-14 NOTE — Transitions of Care (Post Inpatient/ED Visit) (Signed)
   06/14/2023  Name: ANGLINE SCHWEIGERT MRN: [id number] DOB: May 26, 1945  Today's TOC FU Call Status:   Patient chart reviewed for Transition of Care  Clarrisa Kaylor Idelle Jo RN, MSN [facility]  [facility], [facility] RN Care Manager Direct Dial: [phone]  Fax: [phone] Website: [email or web address]

## 2023-06-21 DIAGNOSIS — J189 Pneumonia, unspecified organism: Secondary | ICD-10-CM | POA: Diagnosis not present

## 2023-06-21 DIAGNOSIS — N1831 Chronic kidney disease, stage 3a: Secondary | ICD-10-CM | POA: Diagnosis not present

## 2023-06-21 DIAGNOSIS — E039 Hypothyroidism, unspecified: Secondary | ICD-10-CM | POA: Diagnosis not present

## 2023-06-28 ENCOUNTER — Telehealth: Payer: Self-pay | Admitting: Cardiology

## 2023-06-28 NOTE — Telephone Encounter (Signed)
 Checked [employer]Base several times and there has been nothing there from [facility] about patient or medications. Will forward to front office staff for their review if they have printed and sent for scanning.

## 2023-06-28 NOTE — Telephone Encounter (Signed)
 [employer] calling to let office know that they will be faxing over medications recommendations and please call the pt back with updates information. Please advise

## 2023-06-29 NOTE — Telephone Encounter (Signed)
 I have not seen or received anything please check with haley

## 2023-07-06 ENCOUNTER — Ambulatory Visit (HOSPITAL_COMMUNITY)

## 2023-07-12 DIAGNOSIS — I480 Paroxysmal atrial fibrillation: Secondary | ICD-10-CM | POA: Diagnosis not present

## 2023-07-12 DIAGNOSIS — I110 Hypertensive heart disease with heart failure: Secondary | ICD-10-CM | POA: Diagnosis not present

## 2023-07-12 DIAGNOSIS — I700 Atherosclerosis of aorta: Secondary | ICD-10-CM | POA: Diagnosis not present

## 2023-07-12 DIAGNOSIS — E039 Hypothyroidism, unspecified: Secondary | ICD-10-CM | POA: Diagnosis not present

## 2023-07-12 DIAGNOSIS — D649 Anemia, unspecified: Secondary | ICD-10-CM | POA: Diagnosis not present

## 2023-07-12 DIAGNOSIS — I10 Essential (primary) hypertension: Secondary | ICD-10-CM | POA: Diagnosis not present

## 2023-07-12 DIAGNOSIS — G4761 Periodic limb movement disorder: Secondary | ICD-10-CM | POA: Diagnosis not present

## 2023-07-12 DIAGNOSIS — F418 Other specified anxiety disorders: Secondary | ICD-10-CM | POA: Diagnosis not present

## 2023-07-12 DIAGNOSIS — R001 Bradycardia, unspecified: Secondary | ICD-10-CM | POA: Diagnosis not present

## 2023-07-16 ENCOUNTER — Ambulatory Visit (HOSPITAL_COMMUNITY)
Admission: RE | Admit: 2023-07-16 | Discharge: 2023-07-16 | Disposition: A | Discharge status: Home / Self Care | Source: Ambulatory Visit | Attending: Internal Medicine | Admitting: Internal Medicine

## 2023-07-16 DIAGNOSIS — R918 Other nonspecific abnormal finding of lung field: Secondary | ICD-10-CM | POA: Diagnosis not present

## 2023-07-16 DIAGNOSIS — S2232XA Fracture of one rib, left side, initial encounter for closed fracture: Secondary | ICD-10-CM | POA: Diagnosis not present

## 2023-07-16 DIAGNOSIS — I700 Atherosclerosis of aorta: Secondary | ICD-10-CM | POA: Diagnosis not present

## 2023-07-20 ENCOUNTER — Telehealth: Payer: Self-pay | Admitting: Adult Health

## 2023-07-20 ENCOUNTER — Ambulatory Visit: Payer: Medicare HMO | Admitting: Adult Health

## 2023-07-20 NOTE — Telephone Encounter (Signed)
 LVM for patient to call and discuss rescheduling the 07/20/23 1:30 pm appointment with Tammy Parrett, NP---[place]

## 2023-07-31 ENCOUNTER — Ambulatory Visit
Admission: EM | Admit: 2023-07-31 | Discharge: 2023-07-31 | Disposition: A | Discharge status: Home / Self Care | Attending: Nurse Practitioner | Admitting: Nurse Practitioner

## 2023-07-31 ENCOUNTER — Other Ambulatory Visit: Payer: Self-pay | Admitting: Cardiology

## 2023-07-31 ENCOUNTER — Encounter: Payer: Self-pay | Admitting: Emergency Medicine

## 2023-07-31 DIAGNOSIS — J209 Acute bronchitis, unspecified: Secondary | ICD-10-CM | POA: Diagnosis not present

## 2023-07-31 DIAGNOSIS — J22 Unspecified acute lower respiratory infection: Secondary | ICD-10-CM

## 2023-07-31 LAB — POC SARS CORONAVIRUS 2 AG -  ED: SARS Coronavirus 2 Ag: NEGATIVE

## 2023-07-31 MED ORDER — PREDNISONE 20 MG PO TABS: 40.0000 mg | ORAL_TABLET | Freq: Every day | ORAL | 0 refills | Status: AC

## 2023-07-31 MED ORDER — AMOXICILLIN-POT CLAVULANATE 875-125 MG PO TABS: 1.0000 | ORAL_TABLET | Freq: Two times a day (BID) | ORAL | 0 refills | Status: DC

## 2023-07-31 MED ORDER — GUAIFENESIN 100 MG/5ML PO LIQD: 10.0000 mL | Freq: Four times a day (QID) | ORAL | 0 refills | Status: DC | PRN

## 2023-07-31 NOTE — ED Provider Notes (Signed)
 [facility]    CSN: [id number] Arrival date & time: 07/31/23  1449      History   Chief Complaint Chief Complaint  Patient presents with   Cough    HPI Diane Cohen is a 78 y.o. female.   The history is provided by the patient.   Patient presents for complaints of fever, cough, runny nose, chest congestion, wheezing, and shortness of breath.  Patient states symptoms started over the past several days.  Tmax between 100-101.  Patient denies headache, ear pain, chest pain, abdominal pain, nausea, vomiting, diarrhea, or rash.  Per review of the chart, patient with underlying history of COPD, patient denies history of same when asked.  Patient states she has not been taking any medications for her symptoms.  Past Medical History:  Diagnosis Date   Anxiety    Atrial fibrillation (HCC)    Atrial fibrillation ablation in 2019 - Dr. Nunzio Belch   Chronic diastolic heart failure [facility])    Depression    Essential hypertension    History of TIA (transient ischemic attack)    Hyperlipidemia    Hypothyroidism    Pneumonia     Patient Active Problem List   Diagnosis Date Noted   Acute renal failure superimposed on stage 3b chronic kidney disease (HCC) 06/11/2023   Acute renal failure with acute tubular necrosis superimposed on stage 3a chronic kidney disease (HCC) 06/11/2023   COPD with acute exacerbation (HCC) 06/09/2023   Multifocal pneumonia 06/05/2023   Pneumonia 06/04/2023   Loud snoring 08/19/2022   Acute bilateral low back pain without sciatica    Atrial fibrillation with rapid ventricular response (HCC) 06/23/2019   History of colonic polyps 12/13/2018   Constipation 12/13/2018   Abdominal pain, epigastric 12/13/2018   History of hiatal hernia 05/12/2018   Encounter for screening colonoscopy 05/12/2018   Acute on chronic diastolic CHF (congestive heart failure) (HCC) 04/20/2018   Swelling of limb 11/09/2017   Chronic diastolic heart failure (HCC) 12/29/2013    Anemia 09/20/2013   Bilateral leg edema 08/29/2013   Essential hypertension, benign    Hypothyroidism    Persistent atrial fibrillation     Past Surgical History:  Procedure Laterality Date   ABDOMINAL HYSTERECTOMY     ABLATION OF DYSRHYTHMIC FOCUS  07/06/2017   ATRIAL FIBRILLATION ABLATION N/A 07/06/2017   Procedure: ATRIAL FIBRILLATION ABLATION;  Surgeon: Jolly Needle, MD;  Location: [facility] INVAS[facility] LAB;  Service: Cardiovascular;  Laterality: N/A;   BREAST SURGERY     biopsy   CARDIOVERSION N/A 01/23/2016   Procedure: CARDIOVERSION;  Surgeon: Gerard Knight, MD;  Location: [facility] ENDO SU[facility];  Service: Cardiovascular;  Laterality: N/A;   CARDIOVERSION N/A 11/13/2016   Procedure: CARDIOVERSION;  Surgeon: Laurann Pollock, MD;  Location: [facility] ENDO SUITE;  Service: Endoscopy;  Laterality: N/A;   CARDIOVERSION N/A 04/20/2018   Procedure: CARDIOVERSION;  Surgeon: Euell Herrlich, MD;  Location: [facility] ENDOSCOPY;  Service: Cardiovascular;  Laterality: N/A;   CARDIOVERSION N/A 05/23/2020   Procedure: CARDIOVERSION;  Surgeon: Gerard Knight, MD;  Location: [facility]O [facility];  Service: Cardiovascular;  Laterality: N/A;   CATARACT EXTRACTION W/PHACO Left 11/17/2021   Procedure: CATARACT EXTRACTION PHACO AND INTRAOCULAR LENS PLACEMENT (IOC);  Surgeon: Tarri Farm, MD;  Location: [facility] ORS;  Service: Ophthalmology;  Laterality: Left;  CDE:9.47   CATARACT EXTRACTION W/PHACO Right 12/01/2021   Procedure: CATARACT EXTRACTION PHACO AND INTRAOCULAR LENS PLACEMENT (IOC);  Surgeon: Tarri Farm, MD;  Location: [facility] ORS;  Service: Ophthalmology;  Laterality: Right;  CDE: 6.91   CHOLECYSTECTOMY     COLONOSCOPY N/A 10/07/2018   Sigmoid and descending colon diverticulosis, three 6 to 15 mm polyps in rectum, ascending colon, and IC valve, one 6 mm polyp in cecum. (sessile serrated polyps and sessile serrated adenoma of ascending colon with focal high grade dysplasia.   coloscopy     ESOPHAGEAL DILATION  10/07/2018    Procedure: ESOPHAGEAL DILATION;  Surgeon: Suzette Espy, MD;  Location: [facility];  Service: Endoscopy;;   ESOPHAGOGASTRODUODENOSCOPY N/A 10/07/2018   normal esophagus s/p dilation   POLYPECTOMY  10/07/2018   Procedure: POLYPECTOMY;  Surgeon: Suzette Espy, MD;  Location: [facility];  Service: Endoscopy;;  Cecal polyps x 2 hot snare Ascending polyp x 1 Hot snare Rectal polyp x 1   TEE WITHOUT CARDIOVERSION  07/06/2017   TEE WITHOUT CARDIOVERSION N/A 07/06/2017   Procedure: TRANSESOPHAGEAL ECHOCARDIOGRAM (TEE);  Surgeon: Loyde Rule, MD;  Location: [facility] ENDOSCOPY;  Service: Cardiovascular;  Laterality: N/A;    OB History   No obstetric history on file.      Home Medications    Prior to Admission medications   Medication Sig Start Date End Date Taking? Authorizing Provider  amiodarone  (PACERONE ) 200 MG tablet TAKE 1 TABLET(200 MG) BY MOUTH DAILY 09/15/22   Gerard Knight, MD  atorvastatin  (LIPITOR) 20 MG tablet Take 20 mg by mouth daily. 05/10/22   [provider]  dapagliflozin  propanediol (FARXIGA ) 10 MG TABS tablet Take 1 tablet (10 mg total) by mouth daily before breakfast. 07/08/22   Gerard Knight, MD  diltiazem  (CARDIZEM  CD) 180 MG 24 hr capsule TAKE 1 CAPSULE(180 MG) BY MOUTH DAILY 03/01/23   Gerard Knight, MD  DULoxetine  (CYMBALTA ) 60 MG capsule Take 1 capsule (60 mg total) by mouth daily. 06/24/19   Justina Oman, MD  ipratropium (ATROVENT ) 0.06 % nasal spray Place 2 sprays into both nostrils 3 (three) times daily. 06/12/23   Demaris Fillers, MD  ipratropium-albuterol  (DUONEB) 0.5-2.5 (3) MG/3ML SOLN Take 3 mLs by nebulization every 6 (six) hours as needed. 06/12/23   Demaris Fillers, MD  Iron-FA-B Cmp-C-Biot-Probiotic (FUSION PLUS) CAPS Take 1 capsule by mouth daily. 12/01/22   [provider]  levothyroxine  (SYNTHROID ) 125 MCG tablet Take 125 mcg by mouth every morning. 10/13/22   [provider]  levothyroxine  (SYNTHROID ) 137 MCG tablet Take 137  mcg by mouth daily. 05/31/23   [provider]  metoprolol  succinate (TOPROL  XL) 25 MG 24 hr tablet Take 1 tablet (25 mg total) by mouth daily. Patient taking differently: Take 12.5 mg by mouth daily. 05/31/23   Gerard Knight, MD  potassium chloride  SA (KLOR-CON  M) 20 MEQ tablet TAKE 1 TABLET(20 MEQ) BY MOUTH DAILY 01/06/23   Gerard Knight, MD  pregabalin  (LYRICA ) 75 MG capsule Take 1 capsule (75 mg total) by mouth at bedtime. For restless leg symptoms 04/26/23   Antonio Baumgarten, NP  Rivaroxaban  (XARELTO ) 15 MG TABS tablet TAKE 1 TABLET(15 MG) BY MOUTH DAILY WITH SUPPER 03/31/23   Gerard Knight, MD  torsemide  (DEMADEX ) 20 MG tablet Take 1 tablet (20 mg total) by mouth daily. 04/21/23   Gerard Knight, MD    Family History Family History  Problem Relation Age of Onset   Hypertension Other    Hypertension Father    Hypertension Mother    Anemia Sister    Hypertension Sister    Colon cancer Neg Hx    Colon  polyps Neg Hx     Social History Social History   Tobacco Use   Smoking status: Former    Current packs/day: 0.00    Types: Cigarettes    Quit date: 01/22/1973    Years since quitting: 50.5   Smokeless tobacco: Never  Vaping Use   Vaping status: Never Used  Substance Use Topics   Alcohol use: Not Currently    Alcohol/week: 0.0 standard drinks of alcohol    Comment: 3 times a year: Christmas, Thanksgiving, and maybe just because    Drug use: No     Allergies   Levaquin [levofloxacin] and Sulfa antibiotics   Review of Systems Review of Systems Per HPI  Physical Exam Triage Vital Signs ED Triage Vitals  Encounter Vitals Group     BP 07/31/23 1504 (!) 152/85     Systolic BP Percentile --      Diastolic BP Percentile --      Pulse Rate 07/31/23 1504 86     Resp 07/31/23 1504 18     Temp 07/31/23 1504 98.3 F (36.8 C)     Temp Source 07/31/23 1504 Oral     SpO2 07/31/23 1504 91 %     Weight --      Height --      Head Circumference --       Peak Flow --      Pain Score 07/31/23 1505 0     Pain Loc --      Pain Education --      Exclude from Growth Chart --    No data found.  Updated Vital Signs BP (!) 152/85 (BP Location: Right Arm)   Pulse 86   Temp 98.3 F (36.8 C) (Oral)   Resp 18   SpO2 91%   Visual Acuity Right Eye Distance:   Left Eye Distance:   Bilateral Distance:    Right Eye Near:   Left Eye Near:    Bilateral Near:     Physical Exam Vitals and nursing note reviewed.  Constitutional:      General: She is not in acute distress.    Appearance: Normal appearance.  HENT:     Head: Normocephalic.     Right Ear: Tympanic membrane, ear canal and external ear normal.     Left Ear: Tympanic membrane, ear canal and external ear normal.     Nose: Rhinorrhea present.     Mouth/Throat:     Mouth: Mucous membranes are moist.  Eyes:     Extraocular Movements: Extraocular movements intact.     Conjunctiva/sclera: Conjunctivae normal.     Pupils: Pupils are equal, round, and reactive to light.  Cardiovascular:     Rate and Rhythm: Normal rate and regular rhythm.     Pulses: Normal pulses.     Heart sounds: Normal heart sounds.  Pulmonary:     Effort: Pulmonary effort is normal. No respiratory distress.     Breath sounds: No stridor. Examination of the right-lower field reveals rhonchi. Examination of the left-lower field reveals rhonchi. Rhonchi present. No wheezing or rales.     Comments: Rhonchi in the posterior bilateral lower lobes, clears with coughing. Abdominal:     General: Bowel sounds are normal.     Palpations: Abdomen is soft.     Tenderness: There is no abdominal tenderness.  Musculoskeletal:     Cervical back: Normal range of motion.  Lymphadenopathy:     Cervical: No cervical adenopathy.  Skin:  General: Skin is warm and dry.  Neurological:     General: No focal deficit present.     Mental Status: She is alert and oriented to person, place, and time.  Psychiatric:         Mood and Affect: Mood normal.        Behavior: Behavior normal.      UC Treatments / Results  Labs (all labs ordered are listed, but only abnormal results are displayed) Labs Reviewed  POC SARS CORONAVIRUS 2 AG -  ED    EKG   Radiology No results found.  Procedures Procedures (including critical care time)  Medications Ordered in UC Medications - No data to display  Initial Impression / Assessment and Plan / UC Course  I have reviewed the triage vital signs and the nursing notes.  Pertinent labs & imaging results that were available during my care of the patient were reviewed by me and considered in my medical decision making (see chart for details).  Patient presents with cough for the past several days.  On exam, she has rhonchi in the posterior bilateral lower lobes, rhonchi clears with cough.  Patient also with fever when symptoms started.  Will cover for bacterial bronchitis with Augmentin  875/125 mg tablets twice daily along with prednisone  40 mg daily and guaifenesin  100 mg.  Supportive care recommendations were provided and discussed with the patient to include fluids, rest, over-the-counter analgesics such as Tylenol , and use of a humidifier during sleep.  Discussed indications with patient regarding follow-up.  Patient was in agreement with this plan of care and verbalizes understanding.  All questions were answered.  Patient stable for discharge.  Final Clinical Impressions(s) / UC Diagnoses   Final diagnoses:  None   Discharge Instructions   None    ED Prescriptions   None    PDMP not reviewed this encounter.   Hardy Lia, NP 07/31/23 1536

## 2023-07-31 NOTE — Discharge Instructions (Signed)
 Take medication as prescribed. Increase fluids and allow for plenty of rest. You may take over-the-counter Tylenol  as needed for pain, fever, or general discomfort. May use normal saline nasal spray throughout the day for nasal congestion and runny nose. For your cough, it may be helpful to use a humidifier in your bedroom at nighttime during sleep and to sleep elevated on pillows while symptoms persist. If symptoms do not improve, recommend follow-up with your primary care physician for further evaluation. Follow-up as needed.

## 2023-07-31 NOTE — ED Triage Notes (Signed)
 Productive cough since Wednesday.  States had diarrhea x 1 today.

## 2023-08-11 DIAGNOSIS — J40 Bronchitis, not specified as acute or chronic: Secondary | ICD-10-CM | POA: Insufficient documentation

## 2023-08-11 DIAGNOSIS — Z713 Dietary counseling and surveillance: Secondary | ICD-10-CM | POA: Diagnosis not present

## 2023-08-11 DIAGNOSIS — Z6836 Body mass index (BMI) 36.0-36.9, adult: Secondary | ICD-10-CM | POA: Diagnosis not present

## 2023-08-11 DIAGNOSIS — Z79890 Hormone replacement therapy (postmenopausal): Secondary | ICD-10-CM | POA: Diagnosis not present

## 2023-08-11 DIAGNOSIS — E039 Hypothyroidism, unspecified: Secondary | ICD-10-CM | POA: Diagnosis not present

## 2023-08-11 DIAGNOSIS — I10 Essential (primary) hypertension: Secondary | ICD-10-CM | POA: Diagnosis not present

## 2023-08-11 DIAGNOSIS — Z79899 Other long term (current) drug therapy: Secondary | ICD-10-CM | POA: Diagnosis not present

## 2023-08-11 DIAGNOSIS — R944 Abnormal results of kidney function studies: Secondary | ICD-10-CM | POA: Diagnosis not present

## 2023-08-14 ENCOUNTER — Inpatient Hospital Stay (HOSPITAL_COMMUNITY)
Admission: EM | Admit: 2023-08-14 | Discharge: 2023-08-20 | DRG: 193 | Disposition: A | Discharge status: Home Health | Attending: Internal Medicine | Admitting: Internal Medicine

## 2023-08-14 ENCOUNTER — Other Ambulatory Visit: Payer: Self-pay

## 2023-08-14 ENCOUNTER — Inpatient Hospital Stay (HOSPITAL_COMMUNITY)

## 2023-08-14 ENCOUNTER — Encounter (HOSPITAL_COMMUNITY): Payer: Self-pay | Admitting: Emergency Medicine

## 2023-08-14 ENCOUNTER — Other Ambulatory Visit (HOSPITAL_COMMUNITY): Payer: Self-pay | Admitting: *Deleted

## 2023-08-14 ENCOUNTER — Emergency Department (HOSPITAL_COMMUNITY)

## 2023-08-14 DIAGNOSIS — Z8673 Personal history of transient ischemic attack (TIA), and cerebral infarction without residual deficits: Secondary | ICD-10-CM

## 2023-08-14 DIAGNOSIS — Z6833 Body mass index (BMI) 33.0-33.9, adult: Secondary | ICD-10-CM

## 2023-08-14 DIAGNOSIS — E785 Hyperlipidemia, unspecified: Secondary | ICD-10-CM | POA: Diagnosis present

## 2023-08-14 DIAGNOSIS — R069 Unspecified abnormalities of breathing: Secondary | ICD-10-CM | POA: Diagnosis not present

## 2023-08-14 DIAGNOSIS — Z79890 Hormone replacement therapy (postmenopausal): Secondary | ICD-10-CM

## 2023-08-14 DIAGNOSIS — J441 Chronic obstructive pulmonary disease with (acute) exacerbation: Secondary | ICD-10-CM | POA: Diagnosis present

## 2023-08-14 DIAGNOSIS — E66811 Obesity, class 1: Secondary | ICD-10-CM | POA: Diagnosis present

## 2023-08-14 DIAGNOSIS — Z8249 Family history of ischemic heart disease and other diseases of the circulatory system: Secondary | ICD-10-CM | POA: Diagnosis not present

## 2023-08-14 DIAGNOSIS — I160 Hypertensive urgency: Secondary | ICD-10-CM | POA: Diagnosis present

## 2023-08-14 DIAGNOSIS — F32A Depression, unspecified: Secondary | ICD-10-CM | POA: Diagnosis present

## 2023-08-14 DIAGNOSIS — R0902 Hypoxemia: Secondary | ICD-10-CM | POA: Diagnosis not present

## 2023-08-14 DIAGNOSIS — R918 Other nonspecific abnormal finding of lung field: Secondary | ICD-10-CM | POA: Diagnosis not present

## 2023-08-14 DIAGNOSIS — E039 Hypothyroidism, unspecified: Secondary | ICD-10-CM | POA: Diagnosis present

## 2023-08-14 DIAGNOSIS — Z90710 Acquired absence of both cervix and uterus: Secondary | ICD-10-CM

## 2023-08-14 DIAGNOSIS — D72823 Leukemoid reaction: Secondary | ICD-10-CM | POA: Diagnosis present

## 2023-08-14 DIAGNOSIS — I358 Other nonrheumatic aortic valve disorders: Secondary | ICD-10-CM | POA: Diagnosis present

## 2023-08-14 DIAGNOSIS — Z7901 Long term (current) use of anticoagulants: Secondary | ICD-10-CM | POA: Diagnosis not present

## 2023-08-14 DIAGNOSIS — I480 Paroxysmal atrial fibrillation: Secondary | ICD-10-CM | POA: Diagnosis not present

## 2023-08-14 DIAGNOSIS — I5033 Acute on chronic diastolic (congestive) heart failure: Secondary | ICD-10-CM | POA: Diagnosis present

## 2023-08-14 DIAGNOSIS — R0600 Dyspnea, unspecified: Secondary | ICD-10-CM | POA: Diagnosis not present

## 2023-08-14 DIAGNOSIS — I130 Hypertensive heart and chronic kidney disease with heart failure and stage 1 through stage 4 chronic kidney disease, or unspecified chronic kidney disease: Secondary | ICD-10-CM | POA: Diagnosis not present

## 2023-08-14 DIAGNOSIS — Z87891 Personal history of nicotine dependence: Secondary | ICD-10-CM | POA: Diagnosis not present

## 2023-08-14 DIAGNOSIS — N1831 Chronic kidney disease, stage 3a: Secondary | ICD-10-CM | POA: Diagnosis not present

## 2023-08-14 DIAGNOSIS — J189 Pneumonia, unspecified organism: Secondary | ICD-10-CM | POA: Diagnosis not present

## 2023-08-14 DIAGNOSIS — R0602 Shortness of breath: Secondary | ICD-10-CM | POA: Diagnosis not present

## 2023-08-14 DIAGNOSIS — R0609 Other forms of dyspnea: Secondary | ICD-10-CM | POA: Diagnosis not present

## 2023-08-14 DIAGNOSIS — J159 Unspecified bacterial pneumonia: Principal | ICD-10-CM | POA: Diagnosis present

## 2023-08-14 DIAGNOSIS — J9622 Acute and chronic respiratory failure with hypercapnia: Secondary | ICD-10-CM | POA: Diagnosis not present

## 2023-08-14 DIAGNOSIS — Z7984 Long term (current) use of oral hypoglycemic drugs: Secondary | ICD-10-CM

## 2023-08-14 DIAGNOSIS — I10 Essential (primary) hypertension: Secondary | ICD-10-CM | POA: Diagnosis not present

## 2023-08-14 DIAGNOSIS — J9621 Acute and chronic respiratory failure with hypoxia: Secondary | ICD-10-CM | POA: Diagnosis present

## 2023-08-14 DIAGNOSIS — J440 Chronic obstructive pulmonary disease with acute lower respiratory infection: Secondary | ICD-10-CM | POA: Diagnosis present

## 2023-08-14 DIAGNOSIS — E0590 Thyrotoxicosis, unspecified without thyrotoxic crisis or storm: Secondary | ICD-10-CM | POA: Diagnosis present

## 2023-08-14 DIAGNOSIS — Z860100 Personal history of colon polyps, unspecified: Secondary | ICD-10-CM

## 2023-08-14 DIAGNOSIS — J449 Chronic obstructive pulmonary disease, unspecified: Secondary | ICD-10-CM | POA: Diagnosis not present

## 2023-08-14 DIAGNOSIS — R0603 Acute respiratory distress: Secondary | ICD-10-CM | POA: Diagnosis present

## 2023-08-14 DIAGNOSIS — J181 Lobar pneumonia, unspecified organism: Secondary | ICD-10-CM | POA: Diagnosis not present

## 2023-08-14 DIAGNOSIS — G2581 Restless legs syndrome: Secondary | ICD-10-CM | POA: Diagnosis not present

## 2023-08-14 DIAGNOSIS — Z79899 Other long term (current) drug therapy: Secondary | ICD-10-CM

## 2023-08-14 DIAGNOSIS — E872 Acidosis, unspecified: Secondary | ICD-10-CM | POA: Diagnosis not present

## 2023-08-14 DIAGNOSIS — Z881 Allergy status to other antibiotic agents status: Secondary | ICD-10-CM

## 2023-08-14 DIAGNOSIS — I509 Heart failure, unspecified: Secondary | ICD-10-CM | POA: Insufficient documentation

## 2023-08-14 DIAGNOSIS — F419 Anxiety disorder, unspecified: Secondary | ICD-10-CM | POA: Diagnosis present

## 2023-08-14 DIAGNOSIS — Z882 Allergy status to sulfonamides status: Secondary | ICD-10-CM

## 2023-08-14 LAB — CBC WITH DIFFERENTIAL/PLATELET
Abs Immature Granulocytes: 0.08 10*3/uL — ABNORMAL HIGH (ref 0.00–0.07)
Basophils Absolute: 0.1 10*3/uL (ref 0.0–0.1)
Basophils Relative: 1 %
Eosinophils Absolute: 0.2 10*3/uL (ref 0.0–0.5)
Eosinophils Relative: 1 %
HCT: 47.9 % — ABNORMAL HIGH (ref 36.0–46.0)
Hemoglobin: 15.1 g/dL — ABNORMAL HIGH (ref 12.0–15.0)
Immature Granulocytes: 1 %
Lymphocytes Relative: 30 %
Lymphs Abs: 4.4 10*3/uL — ABNORMAL HIGH (ref 0.7–4.0)
MCH: 33 pg (ref 26.0–34.0)
MCHC: 31.5 g/dL (ref 30.0–36.0)
MCV: 104.8 fL — ABNORMAL HIGH (ref 80.0–100.0)
Monocytes Absolute: 0.9 10*3/uL (ref 0.1–1.0)
Monocytes Relative: 6 %
Neutro Abs: 8.9 10*3/uL — ABNORMAL HIGH (ref 1.7–7.7)
Neutrophils Relative %: 61 %
Platelets: 336 10*3/uL (ref 150–400)
RBC: 4.57 MIL/uL (ref 3.87–5.11)
RDW: 15.2 % (ref 11.5–15.5)
WBC: 14.6 10*3/uL — ABNORMAL HIGH (ref 4.0–10.5)
nRBC: 0 % (ref 0.0–0.2)

## 2023-08-14 LAB — ECHOCARDIOGRAM COMPLETE
AR max vel: 2.07 cm2
AV Area VTI: 2.25 cm2
AV Area mean vel: 2.24 cm2
AV Mean grad: 5 mmHg
AV Peak grad: 9.5 mmHg
Ao pk vel: 1.54 m/s
Area-P 1/2: 3.48 cm2
Height: 61 in
S' Lateral: 2.7 cm
Weight: 2857.16 [oz_av]

## 2023-08-14 LAB — COMPREHENSIVE METABOLIC PANEL WITH GFR
ALT: 26 U/L (ref 0–44)
AST: 34 U/L (ref 15–41)
Albumin: 3.9 g/dL (ref 3.5–5.0)
Alkaline Phosphatase: 114 U/L (ref 38–126)
Anion gap: 6 (ref 5–15)
BUN: 21 mg/dL (ref 8–23)
CO2: 23 mmol/L (ref 22–32)
Calcium: 10.1 mg/dL (ref 8.9–10.3)
Chloride: 108 mmol/L (ref 98–111)
Creatinine, Ser: 1.11 mg/dL — ABNORMAL HIGH (ref 0.44–1.00)
GFR, Estimated: 51 mL/min — ABNORMAL LOW (ref >60)
Glucose, Bld: 173 mg/dL — ABNORMAL HIGH (ref 70–99)
Potassium: 3.8 mmol/L (ref 3.5–5.1)
Sodium: 137 mmol/L (ref 135–145)
Total Bilirubin: 0.9 mg/dL (ref 0.0–1.2)
Total Protein: 7.6 g/dL (ref 6.5–8.1)

## 2023-08-14 LAB — BRAIN NATRIURETIC PEPTIDE: B Natriuretic Peptide: 363 pg/mL — ABNORMAL HIGH (ref 0.0–100.0)

## 2023-08-14 LAB — CBC
HCT: 42.8 % (ref 36.0–46.0)
Hemoglobin: 13.6 g/dL (ref 12.0–15.0)
MCH: 33.3 pg (ref 26.0–34.0)
MCHC: 31.8 g/dL (ref 30.0–36.0)
MCV: 104.9 fL — ABNORMAL HIGH (ref 80.0–100.0)
Platelets: 215 10*3/uL (ref 150–400)
RBC: 4.08 MIL/uL (ref 3.87–5.11)
RDW: 14.9 % (ref 11.5–15.5)
WBC: 16 10*3/uL — ABNORMAL HIGH (ref 4.0–10.5)
nRBC: 0 % (ref 0.0–0.2)

## 2023-08-14 LAB — LACTIC ACID, PLASMA
Lactic Acid, Venous: 2.1 mmol/L (ref 0.5–1.9)
Lactic Acid, Venous: 2.2 mmol/L (ref 0.5–1.9)
Lactic Acid, Venous: 2.4 mmol/L (ref 0.5–1.9)
Lactic Acid, Venous: 3.3 mmol/L (ref 0.5–1.9)
Lactic Acid, Venous: 3.1 mmol/L (ref 0.5–1.9)

## 2023-08-14 LAB — BLOOD GAS, VENOUS
Acid-base deficit: 4.6 mmol/L — ABNORMAL HIGH (ref 0.0–2.0)
Bicarbonate: 24.8 mmol/L (ref 20.0–28.0)
Drawn by: 45323
O2 Saturation: 66.9 %
Patient temperature: 36.6
pCO2, Ven: 64 mmHg — ABNORMAL HIGH (ref 44–60)
pH, Ven: 7.2 — ABNORMAL LOW (ref 7.25–7.43)
pO2, Ven: 44 mmHg (ref 32–45)

## 2023-08-14 LAB — BASIC METABOLIC PANEL WITH GFR
Anion gap: 12 (ref 5–15)
BUN: 21 mg/dL (ref 8–23)
CO2: 22 mmol/L (ref 22–32)
Calcium: 9.9 mg/dL (ref 8.9–10.3)
Chloride: 109 mmol/L (ref 98–111)
Creatinine, Ser: 1.01 mg/dL — ABNORMAL HIGH (ref 0.44–1.00)
GFR, Estimated: 57 mL/min — ABNORMAL LOW (ref >60)
Glucose, Bld: 182 mg/dL — ABNORMAL HIGH (ref 70–99)
Potassium: 3.8 mmol/L (ref 3.5–5.1)
Sodium: 143 mmol/L (ref 135–145)

## 2023-08-14 LAB — TROPONIN I (HIGH SENSITIVITY): Troponin I (High Sensitivity): 8 ng/L (ref <18)

## 2023-08-14 MED ORDER — CHLORHEXIDINE GLUCONATE CLOTH 2 % EX PADS
6.0000 | MEDICATED_PAD | Freq: Every day | CUTANEOUS | Status: DC
  Administered 2023-08-15 – 2023-08-16 (×2): 6 via TOPICAL

## 2023-08-14 MED ORDER — LACTATED RINGERS IV BOLUS (SEPSIS)
1000.0000 mL | Freq: Once | INTRAVENOUS | Status: AC
Start: 2023-08-14 — End: 2023-08-14
  Administered 2023-08-14: 1000 mL via INTRAVENOUS

## 2023-08-14 MED ORDER — SODIUM CHLORIDE 0.9 % IV SOLN
2.0000 g | INTRAVENOUS | Status: AC
  Administered 2023-08-15 – 2023-08-20 (×6): 2 g via INTRAVENOUS
  Filled 2023-08-14 (×6): qty 20

## 2023-08-14 MED ORDER — IPRATROPIUM BROMIDE 0.02 % IN SOLN
RESPIRATORY_TRACT | Status: AC
  Administered 2023-08-14: 0.5 mg
  Filled 2023-08-14: qty 2.5

## 2023-08-14 MED ORDER — BUDESONIDE 0.25 MG/2ML IN SUSP
0.2500 mg | Freq: Two times a day (BID) | RESPIRATORY_TRACT | Status: DC
  Administered 2023-08-14 – 2023-08-18 (×9): 0.25 mg via RESPIRATORY_TRACT
  Filled 2023-08-14 (×9): qty 2

## 2023-08-14 MED ORDER — LEVOTHYROXINE SODIUM 75 MCG PO TABS
150.0000 ug | ORAL_TABLET | Freq: Every day | ORAL | Status: DC
  Administered 2023-08-15 – 2023-08-20 (×6): 150 ug via ORAL
  Filled 2023-08-14 (×7): qty 2

## 2023-08-14 MED ORDER — METHYLPREDNISOLONE SODIUM SUCC 40 MG IJ SOLR
40.0000 mg | Freq: Two times a day (BID) | INTRAMUSCULAR | Status: DC
  Administered 2023-08-14 – 2023-08-17 (×6): 40 mg via INTRAVENOUS
  Filled 2023-08-14 (×6): qty 1

## 2023-08-14 MED ORDER — PREGABALIN 75 MG PO CAPS
75.0000 mg | ORAL_CAPSULE | Freq: Every day | ORAL | Status: DC
  Administered 2023-08-14 – 2023-08-19 (×6): 75 mg via ORAL
  Filled 2023-08-14 (×6): qty 1

## 2023-08-14 MED ORDER — HYDRALAZINE HCL 20 MG/ML IJ SOLN: 10.0000 mg | INTRAMUSCULAR | Status: DC | PRN

## 2023-08-14 MED ORDER — ALBUTEROL SULFATE (2.5 MG/3ML) 0.083% IN NEBU
INHALATION_SOLUTION | RESPIRATORY_TRACT | Status: AC
  Administered 2023-08-14: 5 mg
  Filled 2023-08-14: qty 6

## 2023-08-14 MED ORDER — FUROSEMIDE 10 MG/ML IJ SOLN
20.0000 mg | Freq: Once | INTRAMUSCULAR | Status: AC
  Administered 2023-08-14: 20 mg via INTRAVENOUS
  Filled 2023-08-14: qty 2

## 2023-08-14 MED ORDER — IPRATROPIUM-ALBUTEROL 0.5-2.5 (3) MG/3ML IN SOLN
3.0000 mL | Freq: Four times a day (QID) | RESPIRATORY_TRACT | Status: DC
  Administered 2023-08-14 – 2023-08-15 (×7): 3 mL via RESPIRATORY_TRACT
  Filled 2023-08-14 (×7): qty 3

## 2023-08-14 MED ORDER — LACTATED RINGERS IV BOLUS (SEPSIS)
1000.0000 mL | Freq: Once | INTRAVENOUS | Status: AC
  Administered 2023-08-14: 1000 mL via INTRAVENOUS

## 2023-08-14 MED ORDER — ONDANSETRON HCL 4 MG PO TABS: 4.0000 mg | ORAL_TABLET | Freq: Four times a day (QID) | ORAL | Status: DC | PRN

## 2023-08-14 MED ORDER — SODIUM CHLORIDE 0.9 % IV SOLN: 1.0000 g | INTRAVENOUS | Status: DC

## 2023-08-14 MED ORDER — RIVAROXABAN 15 MG PO TABS
15.0000 mg | ORAL_TABLET | Freq: Every day | ORAL | Status: DC
  Administered 2023-08-14 – 2023-08-19 (×6): 15 mg via ORAL
  Filled 2023-08-14 (×6): qty 1

## 2023-08-14 MED ORDER — SODIUM CHLORIDE 0.9 % IV SOLN
2.0000 g | Freq: Once | INTRAVENOUS | Status: AC
  Administered 2023-08-14: 2 g via INTRAVENOUS
  Filled 2023-08-14: qty 20

## 2023-08-14 MED ORDER — ALBUTEROL SULFATE (2.5 MG/3ML) 0.083% IN NEBU
INHALATION_SOLUTION | RESPIRATORY_TRACT | Status: AC
  Filled 2023-08-14: qty 6

## 2023-08-14 MED ORDER — SODIUM CHLORIDE 0.9 % IV SOLN
500.0000 mg | INTRAVENOUS | Status: DC
  Administered 2023-08-14: 500 mg via INTRAVENOUS
  Filled 2023-08-14: qty 5

## 2023-08-14 MED ORDER — IPRATROPIUM-ALBUTEROL 0.5-2.5 (3) MG/3ML IN SOLN
RESPIRATORY_TRACT | Status: AC
  Administered 2023-08-14: 3 mL
  Filled 2023-08-14: qty 3

## 2023-08-14 MED ORDER — ALBUTEROL SULFATE (2.5 MG/3ML) 0.083% IN NEBU
INHALATION_SOLUTION | RESPIRATORY_TRACT | Status: AC
  Administered 2023-08-14: 2.5 mg
  Filled 2023-08-14: qty 3

## 2023-08-14 MED ORDER — ACETAMINOPHEN 325 MG PO TABS
650.0000 mg | ORAL_TABLET | Freq: Four times a day (QID) | ORAL | Status: AC | PRN
Start: 2023-08-14 — End: ?
  Administered 2023-08-15 – 2023-08-17 (×2): 650 mg via ORAL
  Filled 2023-08-14 (×2): qty 2

## 2023-08-14 MED ORDER — LACTATED RINGERS IV BOLUS (SEPSIS)
500.0000 mL | Freq: Once | INTRAVENOUS | Status: AC
Start: 2023-08-14 — End: 2023-08-14
  Administered 2023-08-14: 500 mL via INTRAVENOUS

## 2023-08-14 MED ORDER — ACETAMINOPHEN 650 MG RE SUPP: 650.0000 mg | Freq: Four times a day (QID) | RECTAL | Status: DC | PRN

## 2023-08-14 MED ORDER — DILTIAZEM HCL ER COATED BEADS 180 MG PO CP24
180.0000 mg | ORAL_CAPSULE | Freq: Every day | ORAL | Status: DC
  Administered 2023-08-14 – 2023-08-20 (×7): 180 mg via ORAL
  Filled 2023-08-14 (×7): qty 1

## 2023-08-14 MED ORDER — POTASSIUM CHLORIDE CRYS ER 20 MEQ PO TBCR
40.0000 meq | EXTENDED_RELEASE_TABLET | Freq: Every day | ORAL | Status: DC
  Administered 2023-08-14 – 2023-08-19 (×6): 40 meq via ORAL
  Filled 2023-08-14 (×6): qty 2

## 2023-08-14 MED ORDER — DAPAGLIFLOZIN PROPANEDIOL 10 MG PO TABS
10.0000 mg | ORAL_TABLET | Freq: Every day | ORAL | Status: DC
  Administered 2023-08-14 – 2023-08-20 (×7): 10 mg via ORAL
  Filled 2023-08-14 (×9): qty 1

## 2023-08-14 MED ORDER — TORSEMIDE 20 MG PO TABS
20.0000 mg | ORAL_TABLET | Freq: Every day | ORAL | Status: DC
  Administered 2023-08-15 – 2023-08-19 (×5): 20 mg via ORAL
  Filled 2023-08-14 (×5): qty 1

## 2023-08-14 MED ORDER — SODIUM CHLORIDE 0.9 % IV SOLN: 500.0000 mg | INTRAVENOUS | Status: DC

## 2023-08-14 MED ORDER — IPRATROPIUM BROMIDE 0.02 % IN SOLN
RESPIRATORY_TRACT | Status: AC
  Filled 2023-08-14: qty 2.5

## 2023-08-14 MED ORDER — DULOXETINE HCL 60 MG PO CPEP
60.0000 mg | ORAL_CAPSULE | Freq: Every day | ORAL | Status: DC
  Administered 2023-08-15 – 2023-08-20 (×6): 60 mg via ORAL
  Filled 2023-08-14 (×6): qty 1

## 2023-08-14 MED ORDER — LACTATED RINGERS IV BOLUS
1000.0000 mL | Freq: Once | INTRAVENOUS | Status: AC
  Administered 2023-08-14: 1000 mL via INTRAVENOUS

## 2023-08-14 MED ORDER — SODIUM CHLORIDE 0.9 % IV SOLN
500.0000 mg | INTRAVENOUS | Status: DC
  Administered 2023-08-15 – 2023-08-17 (×3): 500 mg via INTRAVENOUS
  Filled 2023-08-14 (×3): qty 5

## 2023-08-14 MED ORDER — AMIODARONE HCL 200 MG PO TABS
200.0000 mg | ORAL_TABLET | Freq: Every day | ORAL | Status: DC
  Administered 2023-08-14 – 2023-08-20 (×7): 200 mg via ORAL
  Filled 2023-08-14 (×7): qty 1

## 2023-08-14 MED ORDER — ALBUTEROL SULFATE (2.5 MG/3ML) 0.083% IN NEBU
INHALATION_SOLUTION | RESPIRATORY_TRACT | Status: AC
  Filled 2023-08-14: qty 9

## 2023-08-14 MED ORDER — ALBUTEROL SULFATE (2.5 MG/3ML) 0.083% IN NEBU: 2.5000 mg | INHALATION_SOLUTION | RESPIRATORY_TRACT | Status: DC | PRN

## 2023-08-14 MED ORDER — ATORVASTATIN CALCIUM 20 MG PO TABS
20.0000 mg | ORAL_TABLET | Freq: Every day | ORAL | Status: DC
  Administered 2023-08-14 – 2023-08-20 (×7): 20 mg via ORAL
  Filled 2023-08-14 (×7): qty 1

## 2023-08-14 MED ORDER — LEVOTHYROXINE SODIUM 137 MCG PO TABS: 137.0000 ug | ORAL_TABLET | Freq: Every day | ORAL | Status: DC

## 2023-08-14 MED ORDER — ONDANSETRON HCL 4 MG/2ML IJ SOLN: 4.0000 mg | Freq: Four times a day (QID) | INTRAMUSCULAR | Status: DC | PRN

## 2023-08-14 MED ORDER — METOPROLOL SUCCINATE ER 25 MG PO TB24
25.0000 mg | ORAL_TABLET | Freq: Every day | ORAL | Status: DC
  Administered 2023-08-14 – 2023-08-20 (×7): 25 mg via ORAL
  Filled 2023-08-14 (×7): qty 1

## 2023-08-14 NOTE — Assessment & Plan Note (Signed)
 Continue levothyroxine 

## 2023-08-14 NOTE — Assessment & Plan Note (Signed)
 2024 echocardiogram with preserved LV systolic function with EF 60 to 65%, mild LVH, right ventricle with normal systolic function, RVSP 60.7 mmHg, with severe dilatation of left atrium and moderate dilation right atrium, no significant valvular disease.   Plan to continue metoprolol  and SGLT 2 inh.  Hold on diuretic therapy for now.  Follow up on repeat echocardiogram.

## 2023-08-14 NOTE — ED Notes (Signed)
 PO meds held, per RT pt may can come off Bi-pap soon, attending aware

## 2023-08-14 NOTE — Progress Notes (Signed)
 ASSUMPTION OF CARE NOTE   08/14/2023 10:57 AM  Diane Cohen was seen and examined.  The H&P by the admitting provider, orders, imaging was reviewed.  Please see new orders.  Will continue to follow.   Vitals:   08/14/23 0930 08/14/23 1000  BP: 131/65 131/82  Pulse: 91 96  Resp: (!) 22 (!) 24  Temp:    SpO2: 98% 97%    Results for orders placed or performed during the hospital encounter of 08/14/23  CBC with Differential   Collection Time: 08/14/23  2:07 AM  Result Value Ref Range   WBC 14.6 (H) 4.0 - 10.5 K/uL   RBC 4.57 3.87 - 5.11 MIL/uL   Hemoglobin 15.1 (H) 12.0 - 15.0 g/dL   HCT 53.6 (H) 64.4 - 03.4 %   MCV 104.8 (H) 80.0 - 100.0 fL   MCH 33.0 26.0 - 34.0 pg   MCHC 31.5 30.0 - 36.0 g/dL   RDW 74.2 59.5 - 63.8 %   Platelets 336 150 - 400 K/uL   nRBC 0.0 0.0 - 0.2 %   Neutrophils Relative % 61 %   Neutro Abs 8.9 (H) 1.7 - 7.7 K/uL   Lymphocytes Relative 30 %   Lymphs Abs 4.4 (H) 0.7 - 4.0 K/uL   Monocytes Relative 6 %   Monocytes Absolute 0.9 0.1 - 1.0 K/uL   Eosinophils Relative 1 %   Eosinophils Absolute 0.2 0.0 - 0.5 K/uL   Basophils Relative 1 %   Basophils Absolute 0.1 0.0 - 0.1 K/uL   Immature Granulocytes 1 %   Abs Immature Granulocytes 0.08 (H) 0.00 - 0.07 K/uL  Comprehensive metabolic panel   Collection Time: 08/14/23  2:07 AM  Result Value Ref Range   Sodium 137 135 - 145 mmol/L   Potassium 3.8 3.5 - 5.1 mmol/L   Chloride 108 98 - 111 mmol/L   CO2 23 22 - 32 mmol/L   Glucose, Bld 173 (H) 70 - 99 mg/dL   BUN 21 8 - 23 mg/dL   Creatinine, Ser 7.56 (H) 0.44 - 1.00 mg/dL   Calcium  10.1 8.9 - 10.3 mg/dL   Total Protein 7.6 6.5 - 8.1 g/dL   Albumin 3.9 3.5 - 5.0 g/dL   AST 34 15 - 41 U/L   ALT 26 0 - 44 U/L   Alkaline Phosphatase 114 38 - 126 U/L   Total Bilirubin 0.9 0.0 - 1.2 mg/dL   GFR, Estimated 51 (L) >60 mL/min   Anion gap 6 5 - 15  Brain natriuretic peptide   Collection Time: 08/14/23  2:07 AM  Result Value Ref Range   B Natriuretic  Peptide 363.0 (H) 0.0 - 100.0 pg/mL  Blood gas, venous (at [facility] and [facility])   Collection Time: 08/14/23  2:07 AM  Result Value Ref Range   pH, Ven 7.2 (L) 7.25 - 7.43   pCO2, Ven 64 (H) 44 - 60 mmHg   pO2, Ven 44 32 - 45 mmHg   Bicarbonate 24.8 20.0 - 28.0 mmol/L   Acid-base deficit 4.6 (H) 0.0 - 2.0 mmol/L   O2 Saturation 66.9 %   Patient temperature 36.6    Collection site LEFT ANTECUBITAL    Drawn by [id number]   Lactic acid, plasma   Collection Time: 08/14/23  2:07 AM  Result Value Ref Range   Lactic Acid, Venous 2.1 (HH) 0.5 - 1.9 mmol/L  Troponin I (High Sensitivity)   Collection Time: 08/14/23  2:07 AM  Result Value Ref Range  Troponin I (High Sensitivity) 8 <18 ng/L  Blood culture (routine x 2)   Collection Time: 08/14/23  2:52 AM   Specimen: BLOOD RIGHT HAND  Result Value Ref Range   Specimen Description BLOOD RIGHT HAND    Special Requests      BOTTLES DRAWN AEROBIC AND ANAEROBIC Blood Culture adequate volume   Culture      NO GROWTH < 12 HOURS Performed at [facility], [address] [place], [place] [address]    Report Status PENDING   Blood culture (routine x 2)   Collection Time: 08/14/23  3:20 AM   Specimen: BLOOD RIGHT HAND  Result Value Ref Range   Specimen Description BLOOD RIGHT HAND    Special Requests      BOTTLES DRAWN AEROBIC ONLY Blood Culture adequate volume   Culture      NO GROWTH < 12 HOURS Performed at [facility], [address], [place], [place] [address]    Report Status PENDING   Lactic acid, plasma   Collection Time: 08/14/23  4:39 AM  Result Value Ref Range   Lactic Acid, Venous 2.2 (HH) 0.5 - 1.9 mmol/L  Troponin I (High Sensitivity)   Collection Time: 08/14/23  4:39 AM  Result Value Ref Range   Troponin I (High Sensitivity) 43 (H) <18 ng/L  Basic metabolic panel   Collection Time: 08/14/23  7:02 AM  Result Value Ref Range   Sodium 143 135 - 145 mmol/L   Potassium 3.8 3.5 - 5.1 mmol/L   Chloride 109 98 - 111 mmol/L   CO2 22 22 - 32  mmol/L   Glucose, Bld 182 (H) 70 - 99 mg/dL   BUN 21 8 - 23 mg/dL   Creatinine, Ser 8.29 (H) 0.44 - 1.00 mg/dL   Calcium  9.9 8.9 - 10.3 mg/dL   GFR, Estimated 57 (L) >60 mL/min   Anion gap 12 5 - 15  CBC   Collection Time: 08/14/23  7:02 AM  Result Value Ref Range   WBC 16.0 (H) 4.0 - 10.5 K/uL   RBC 4.08 3.87 - 5.11 MIL/uL   Hemoglobin 13.6 12.0 - 15.0 g/dL   HCT 56.2 13.0 - 86.5 %   MCV 104.9 (H) 80.0 - 100.0 fL   MCH 33.3 26.0 - 34.0 pg   MCHC 31.8 30.0 - 36.0 g/dL   RDW 78.4 69.6 - 29.5 %   Platelets 215 150 - 400 K/uL   nRBC 0.0 0.0 - 0.2 %  Lactic acid, plasma   Collection Time: 08/14/23  8:02 AM  Result Value Ref Range   Lactic Acid, Venous 2.4 (HH) 0.5 - 1.9 mmol/L  ECHOCARDIOGRAM COMPLETE   Collection Time: 08/14/23 10:41 AM  Result Value Ref Range   Weight 2,857.16 oz   Height 61 in   BP 130/58 mmHg     Olga Berthold, MD [facility]   08/14/2023  2:06 AM How to contact the [facility] Attending or Consulting provider 7A - 7P or covering provider during after hours 7P -7A, for this patient?  Check the care team in [facility] and look for a) attending/consulting [facility] provider listed and b) the [facility] team listed Log into [email or web address] and use [place]'s universal password to access. If you do not have the password, please contact the hospital operator. Locate the [facility] provider you are looking for under [facility] and page to a number that you can be directly reached. If you still have difficulty reaching the provider, please page the [facility] (Director  on Call) for the Hospitalists listed on amion for assistance.

## 2023-08-14 NOTE — Assessment & Plan Note (Signed)
 Uncontrolled hypertension/ emergency.  Plan to continue metoprolol  and diltiazem .  Add as needed hydralazine   No IV fluids.

## 2023-08-14 NOTE — Assessment & Plan Note (Signed)
 Acute hypoxemic and hypercapnic respiratory failure.   Plan to continue on Bipap, currently 14/6 with Fi02 100% generating tidal volumes of 600 and 02 saturation 97%. Continue aggressive bronchodilator therapy Inhaled and systemic corticosteroids (methylprednisolone  40 mg IV bid) Keep 02 saturation 92% or greater.

## 2023-08-14 NOTE — ED Provider Notes (Signed)
  EMERGENCY DEPARTMENT AT [facility] Provider Note   CSN: [id number] Arrival date & time: 08/14/23  0205     History  Chief Complaint  Patient presents with   Respiratory Distress    Diane Cohen is a 78 y.o. female.  78 yo F presents to ED in resp distress with EMS. They were called out for sob. On their arrival patient was reportedly in 70's saturation. Wheezing in upper lobes, rhonchorous in lowers. Mag/duonebs/cpap initiated and brought here.   She denies le edema, endorses recent treatmetn for bronchitis. Denies fever. No nausea, vomiting, chest pain.         Home Medications Prior to Admission medications   Medication Sig Start Date End Date Taking? Authorizing Provider  amiodarone  (PACERONE ) 200 MG tablet TAKE 1 TABLET(200 MG) BY MOUTH DAILY 09/15/22   Gerard Knight, MD  amoxicillin -clavulanate (AUGMENTIN ) 875-125 MG tablet Take 1 tablet by mouth every 12 (twelve) hours. 07/31/23   Leath-Warren, Belen Bowers, NP  atorvastatin  (LIPITOR) 20 MG tablet Take 20 mg by mouth daily. 05/10/22   [provider]  diltiazem  (CARDIZEM  CD) 180 MG 24 hr capsule TAKE 1 CAPSULE(180 MG) BY MOUTH DAILY 03/01/23   Gerard Knight, MD  DULoxetine  (CYMBALTA ) 60 MG capsule Take 1 capsule (60 mg total) by mouth daily. 06/24/19   Justina Oman, MD  FARXIGA  10 MG TABS tablet TAKE 1 TABLET(10 MG) BY MOUTH DAILY BEFORE BREAKFAST 08/03/23   Gerard Knight, MD  guaiFENesin  (ROBITUSSIN) 100 MG/5ML liquid Take 10 mLs by mouth every 6 (six) hours as needed for cough or to loosen phlegm. 07/31/23   Leath-Warren, Belen Bowers, NP  ipratropium (ATROVENT ) 0.06 % nasal spray Place 2 sprays into both nostrils 3 (three) times daily. 06/12/23   Demaris Fillers, MD  ipratropium-albuterol  (DUONEB) 0.5-2.5 (3) MG/3ML SOLN Take 3 mLs by nebulization every 6 (six) hours as needed. 06/12/23   Demaris Fillers, MD  Iron-FA-B Cmp-C-Biot-Probiotic (FUSION PLUS) CAPS Take 1 capsule by mouth daily. 12/01/22    [provider]  levothyroxine  (SYNTHROID ) 125 MCG tablet Take 125 mcg by mouth every morning. 10/13/22   [provider]  levothyroxine  (SYNTHROID ) 137 MCG tablet Take 137 mcg by mouth daily. 05/31/23   [provider]  metoprolol  succinate (TOPROL  XL) 25 MG 24 hr tablet Take 1 tablet (25 mg total) by mouth daily. Patient taking differently: Take 12.5 mg by mouth daily. 05/31/23   Gerard Knight, MD  potassium chloride  SA (KLOR-CON  M) 20 MEQ tablet TAKE 1 TABLET(20 MEQ) BY MOUTH DAILY 01/06/23   Gerard Knight, MD  pregabalin  (LYRICA ) 75 MG capsule Take 1 capsule (75 mg total) by mouth at bedtime. For restless leg symptoms 04/26/23   Antonio Baumgarten, NP  Rivaroxaban  (XARELTO ) 15 MG TABS tablet TAKE 1 TABLET(15 MG) BY MOUTH DAILY WITH SUPPER 03/31/23   Gerard Knight, MD  torsemide  (DEMADEX ) 20 MG tablet Take 1 tablet (20 mg total) by mouth daily. 04/21/23   Gerard Knight, MD      Allergies    Levaquin [levofloxacin] and Sulfa antibiotics    Review of Systems   Review of Systems  Physical Exam Updated Vital Signs BP (!) 131/59   Pulse 87   Temp 98.2 F (36.8 C) (Temporal)   Resp (!) 22   Ht 5\' 1"  (1.549 m)   Wt 81 kg   SpO2 98%   BMI 33.74 kg/m  Physical Exam Vitals and nursing note reviewed.  Constitutional:      General: She is in acute distress.     Appearance: She is well-developed.  HENT:     Head: Normocephalic and atraumatic.  Cardiovascular:     Rate and Rhythm: Normal rate and regular rhythm.  Pulmonary:     Effort: Tachypnea, accessory muscle usage and respiratory distress present.     Breath sounds: Decreased air movement present. No stridor. Wheezing, rhonchi and rales present.  Abdominal:     General: There is no distension.  Musculoskeletal:     Cervical back: Normal range of motion.  Neurological:     Mental Status: She is alert.     ED Results / Procedures / Treatments   Labs (all labs ordered are listed, but  only abnormal results are displayed) Labs Reviewed  CBC WITH DIFFERENTIAL/PLATELET - Abnormal; Notable for the following components:      Result Value   WBC 14.6 (*)    Hemoglobin 15.1 (*)    HCT 47.9 (*)    MCV 104.8 (*)    Neutro Abs 8.9 (*)    Lymphs Abs 4.4 (*)    Abs Immature Granulocytes 0.08 (*)    All other components within normal limits  COMPREHENSIVE METABOLIC PANEL WITH GFR - Abnormal; Notable for the following components:   Glucose, Bld 173 (*)    Creatinine, Ser 1.11 (*)    GFR, Estimated 51 (*)    All other components within normal limits  BRAIN NATRIURETIC PEPTIDE - Abnormal; Notable for the following components:   B Natriuretic Peptide 363.0 (*)    All other components within normal limits  BLOOD GAS, VENOUS - Abnormal; Notable for the following components:   pH, Ven 7.2 (*)    pCO2, Ven 64 (*)    Acid-base deficit 4.6 (*)    All other components within normal limits  LACTIC ACID, PLASMA - Abnormal; Notable for the following components:   Lactic Acid, Venous 2.1 (*)    All other components within normal limits  LACTIC ACID, PLASMA - Abnormal; Notable for the following components:   Lactic Acid, Venous 2.2 (*)    All other components within normal limits  TROPONIN I (HIGH SENSITIVITY) - Abnormal; Notable for the following components:   Troponin I (High Sensitivity) 43 (*)    All other components within normal limits  CULTURE, BLOOD (ROUTINE X 2)  CULTURE, BLOOD (ROUTINE X 2)  BASIC METABOLIC PANEL WITH GFR  CBC  TROPONIN I (HIGH SENSITIVITY)    EKG None  Radiology DG Chest Portable 1 View Result Date: 08/14/2023 CLINICAL DATA:  Respiratory distress EXAM: PORTABLE CHEST 1 VIEW COMPARISON:  06/09/2023, 07/16/2023 FINDINGS: Cardiac shadow is within normal limits. Near complete consolidation of the right upper lobe is noted. Left perihilar opacity is noted as well. IMPRESSION: Increasing right upper lobe consolidation consistent with focal infiltrate. Left  perihilar opacity and left retrocardiac opacity. Electronically Signed   By: Violeta Grey M.D.   On: 08/14/2023 02:34    Procedures .Critical Care  Performed by: Eve Hinders, MD Authorized by: Eve Hinders, MD   Critical care provider statement:    Critical care time (minutes):  30   Critical care was necessary to treat or prevent imminent or life-threatening deterioration of the following conditions:  Respiratory failure   Critical care was time spent personally by me on the following activities:  Development of treatment plan with patient or surrogate, discussions with consultants, evaluation of patient's response to treatment, examination of patient, ordering  and review of laboratory studies, ordering and review of radiographic studies, ordering and performing treatments and interventions, pulse oximetry, re-evaluation of patient's condition and review of old charts     Medications Ordered in ED Medications  azithromycin  (ZITHROMAX ) 500 mg in sodium chloride  0.9 % 250 mL IVPB (0 mg Intravenous Stopped 08/14/23 0442)  amiodarone  (PACERONE ) tablet 200 mg (has no administration in time range)  atorvastatin  (LIPITOR) tablet 20 mg (has no administration in time range)  diltiazem  (CARDIZEM  CD) 24 hr capsule 180 mg (has no administration in time range)  metoprolol  succinate (TOPROL -XL) 24 hr tablet 25 mg (has no administration in time range)  DULoxetine  (CYMBALTA ) DR capsule 60 mg (has no administration in time range)  dapagliflozin  propanediol (FARXIGA ) tablet 10 mg (has no administration in time range)  levothyroxine  (SYNTHROID ) tablet 137 mcg (has no administration in time range)  Rivaroxaban  (XARELTO ) tablet 15 mg (has no administration in time range)  pregabalin  (LYRICA ) capsule 75 mg (has no administration in time range)  ipratropium-albuterol  (DUONEB) 0.5-2.5 (3) MG/3ML nebulizer solution 3 mL (has no administration in time range)  acetaminophen  (TYLENOL ) tablet 650 mg (has no  administration in time range)    Or  acetaminophen  (TYLENOL ) suppository 650 mg (has no administration in time range)  ondansetron  (ZOFRAN ) tablet 4 mg (has no administration in time range)    Or  ondansetron  (ZOFRAN ) injection 4 mg (has no administration in time range)  azithromycin  (ZITHROMAX ) 500 mg in sodium chloride  0.9 % 250 mL IVPB (has no administration in time range)  budesonide  (PULMICORT ) nebulizer solution 0.25 mg (has no administration in time range)  albuterol  (PROVENTIL ) (2.5 MG/3ML) 0.083% nebulizer solution 2.5 mg (has no administration in time range)  methylPREDNISolone  sodium succinate (SOLU-MEDROL ) 40 mg/mL injection 40 mg (has no administration in time range)  hydrALAZINE  (APRESOLINE ) injection 10 mg (has no administration in time range)  cefTRIAXone  (ROCEPHIN ) 1 g in sodium chloride  0.9 % 100 mL IVPB (has no administration in time range)  albuterol  (PROVENTIL ) (2.5 MG/3ML) 0.083% nebulizer solution (  Given 08/14/23 0217)  ipratropium (ATROVENT ) 0.02 % nebulizer solution (  Given 08/14/23 0217)  cefTRIAXone  (ROCEPHIN ) 2 g in sodium chloride  0.9 % 100 mL IVPB (0 g Intravenous Stopped 08/14/23 0339)  albuterol  (PROVENTIL ) (2.5 MG/3ML) 0.083% nebulizer solution (  Given 08/14/23 0249)  ipratropium-albuterol  (DUONEB) 0.5-2.5 (3) MG/3ML nebulizer solution (3 mLs  Given 08/14/23 0357)  albuterol  (PROVENTIL ) (2.5 MG/3ML) 0.083% nebulizer solution (2.5 mg  Given 08/14/23 0357)  ipratropium (ATROVENT ) 0.02 % nebulizer solution (0.5 mg  Given 08/14/23 0610)  albuterol  (PROVENTIL ) (2.5 MG/3ML) 0.083% nebulizer solution (5 mg  Given 08/14/23 1610)    ED Course/ Medical Decision Making/ A&P                                 Medical Decision Making Amount and/or Complexity of Data Reviewed Labs: ordered. Radiology: ordered.  Risk Decision regarding hospitalization.   Here acute on chronic resp failure. H/o chf and was initially quite hypertensive so thought to be pulmonary edema.  However  seen her x-ray on my interpretation she has a pretty significant right upper lobe opacity.  Concerned that she might have pneumonia so code sepsis initiated.  However secondary to this still unclear nature of her acute respiratory failure held on given significant amounts of fluid as her BNP was slightly elevated.  Lactic acid 2.1 and blood pressure slowly improved with the BiPAP so  held on a nitroglycerin  for possible pulmonary edema.  Secondary to her having a couple different possible causes of her respiratory failure neither which were clear at this point discussed with the hospitalist will hold on any further aggressive treatment to allow her to declare herself.   Final Clinical Impression(s) / ED Diagnoses Final diagnoses:  Respiratory distress  Acute on chronic respiratory failure with hypoxia [facility])    Rx / DC Orders ED Discharge Orders     None         Janneth Krasner, Reymundo Caulk, MD 08/14/23 [phone]

## 2023-08-14 NOTE — H&P (Addendum)
 History and Physical    Patient: Diane Cohen WUJ:[id number] DOB: September 07, 1945 DOA: 08/14/2023 DOS: the patient was seen and examined on 08/14/2023 PCP: Omie Bickers, MD  Patient coming from: Home  Chief Complaint:  Chief Complaint  Patient presents with   Respiratory Distress   HPI: Diane Cohen is a 78 y.o. female with medical history significant of atrial fibrillation, hypothyroidism, heart failure, COPD, and hyperlipidemia, who presented with dyspnea.   Reported 4 days of progressive and worsening dyspnea, associated with cough, increase sputum production and wheezing. Severe weakness. She used her inhalers and nebulizer with no significant improvement in her symptoms. Positive fever but no chills.   Today she had severe symptoms, prompting her to call EMS. She was found in respiratory distress. Patient received IV steroids, nebulized albuterol , placed on Cpap and transported to the ED.  She denies any PND, orthopnea or lower extremity edema. She ambulates with no assistance of walker or cane. Uses nocturnal supplemental 02 per [place].   07/31/23 patient was diagnoses with bronchitis at a Urgent Care. She was treated with Augmentin , prednisone  and guaifenesin  with improvement in her symptoms.   In the ED her 02 saturation was 84%, had increased work of breathing and placed on non invasive mechanical ventilation.  At the time of my examination patient on full face mask, positive severe dyspnea, worse with movement.  Her daughter is in [place]  and is on her way to the hospital.   Review of Systems: As mentioned in the history of present illness. All other systems reviewed and are negative. Past Medical History:  Diagnosis Date   Anxiety    Atrial fibrillation [facility])    Atrial fibrillation ablation in 2019 - Dr. Nunzio Belch   Chronic diastolic heart failure (HCC)    Depression    Essential hypertension    History of TIA (transient ischemic attack)    Hyperlipidemia    Hypothyroidism     Pneumonia    Past Surgical History:  Procedure Laterality Date   ABDOMINAL HYSTERECTOMY     ABLATION OF DYSRHYTHMIC FOCUS  07/06/2017   ATRIAL FIBRILLATION ABLATION N/A 07/06/2017   Procedure: ATRIAL FIBRILLATION ABLATION;  Surgeon: Jolly Needle, MD;  Location: [facility] INVASIVE CV LAB;  Service: Cardiovascular;  Laterality: N/A;   BREAST SURGERY     biopsy   CARDIOVERSION N/A 01/23/2016   Procedure: CARDIOVERSION;  Surgeon: Gerard Knight, MD;  Location: [facility] ENDO SUITE;  Service: Cardiovascular;  Laterality: N/A;   CARDIOVERSION N/A 11/13/2016   Procedure: CARDIOVERSION;  Surgeon: Laurann Pollock, MD;  Location: [facility] ENDO SUITE;  Service: Endoscopy;  Laterality: N/A;   CARDIOVERSION N/A 04/20/2018   Procedure: CARDIOVERSION;  Surgeon: Euell Herrlich, MD;  Location: [facility] ENDOSCOPY;  Service: Cardiovascular;  Laterality: N/A;   CARDIOVERSION N/A 05/23/2020   Procedure: CARDIOVERSION;  Surgeon: Gerard Knight, MD;  Location: [facility] ENDO SUITE;  Service: Cardiovascular;  Laterality: N/A;   CATARACT EXTRACTION W/PHACO Left 11/17/2021   Procedure: CATARACT EXTRACTION PHACO AND INTRAOCULAR LENS PLACEMENT (IOC);  Surgeon: Tarri Farm, MD;  Location: [facility] ORS;  Service: Ophthalmology;  Laterality: Left;  CDE:9.47   CATARACT EXTRACTION W/PHACO Right 12/01/2021   Procedure: CATARACT EXTRACTION PHACO AND INTRAOCULAR LENS PLACEMENT (IOC);  Surgeon: Tarri Farm, MD;  Location: [facility] ORS;  Service: Ophthalmology;  Laterality: Right;  CDE: 6.91   CHOLECYSTECTOMY     COLONOSCOPY N/A 10/07/2018   Sigmoid and descending colon diverticulosis, three 6 to 15 mm polyps in rectum, ascending colon, and  IC valve, one 6 mm polyp in cecum. (sessile serrated polyps and sessile serrated adenoma of ascending colon with focal high grade dysplasia.   coloscopy     ESOPHAGEAL DILATION  10/07/2018   Procedure: ESOPHAGEAL DILATION;  Surgeon: Suzette Espy, MD;  Location: [facility] ENDO [facility];  Service: Endoscopy;;    ESOPHAGOGASTRODUODENOSCOPY N/A 10/07/2018   normal esophagus s/p dilation   POLYPECTOMY  10/07/2018   Procedure: POLYPECTOMY;  Surgeon: Suzette Espy, MD;  Location: [facility] ENDO [facility];  Service: Endoscopy;;  Cecal polyps x 2 hot snare Ascending polyp x 1 Hot snare Rectal polyp x 1   TEE WITHOUT CARDIOVERSION  07/06/2017   TEE WITHOUT CARDIOVERSION N/A 07/06/2017   Procedure: TRANSESOPHAGEAL ECHOCARDIOGRAM (TEE);  Surgeon: Loyde Rule, MD;  Location: [facility] ENDOSCOPY;  Service: Cardiovascular;  Laterality: N/A;   Social History:  reports that she quit smoking about 50 years ago. Her smoking use included cigarettes. She has never used smokeless tobacco. She reports that she does not currently use alcohol. She reports that she does not use drugs.  Allergies  Allergen Reactions   Levaquin [Levofloxacin] Other (See Comments)    Headache    Sulfa Antibiotics Hives    Family History  Problem Relation Age of Onset   Hypertension Other    Hypertension Father    Hypertension Mother    Anemia Sister    Hypertension Sister    Colon cancer Neg Hx    Colon polyps Neg Hx     Prior to Admission medications   Medication Sig Start Date End Date Taking? Authorizing Provider  amiodarone  (PACERONE ) 200 MG tablet TAKE 1 TABLET(200 MG) BY MOUTH DAILY 09/15/22   Gerard Knight, MD  amoxicillin -clavulanate (AUGMENTIN ) 875-125 MG tablet Take 1 tablet by mouth every 12 (twelve) hours. 07/31/23   Leath-Warren, Belen Bowers, NP  atorvastatin  (LIPITOR) 20 MG tablet Take 20 mg by mouth daily. 05/10/22   [provider]  diltiazem  (CARDIZEM  CD) 180 MG 24 hr capsule TAKE 1 CAPSULE(180 MG) BY MOUTH DAILY 03/01/23   Gerard Knight, MD  DULoxetine  (CYMBALTA ) 60 MG capsule Take 1 capsule (60 mg total) by mouth daily. 06/24/19   Justina Oman, MD  FARXIGA  10 MG TABS tablet TAKE 1 TABLET(10 MG) BY MOUTH DAILY BEFORE BREAKFAST 08/03/23   Gerard Knight, MD  guaiFENesin  (ROBITUSSIN) 100 MG/5ML liquid Take 10  mLs by mouth every 6 (six) hours as needed for cough or to loosen phlegm. 07/31/23   Leath-Warren, Belen Bowers, NP  ipratropium (ATROVENT ) 0.06 % nasal spray Place 2 sprays into both nostrils 3 (three) times daily. 06/12/23   Demaris Fillers, MD  ipratropium-albuterol  (DUONEB) 0.5-2.5 (3) MG/3ML SOLN Take 3 mLs by nebulization every 6 (six) hours as needed. 06/12/23   Demaris Fillers, MD  Iron-FA-B Cmp-C-Biot-Probiotic (FUSION PLUS) CAPS Take 1 capsule by mouth daily. 12/01/22   [provider]  levothyroxine  (SYNTHROID ) 125 MCG tablet Take 125 mcg by mouth every morning. 10/13/22   [provider]  levothyroxine  (SYNTHROID ) 137 MCG tablet Take 137 mcg by mouth daily. 05/31/23   [provider]  metoprolol  succinate (TOPROL  XL) 25 MG 24 hr tablet Take 1 tablet (25 mg total) by mouth daily. Patient taking differently: Take 12.5 mg by mouth daily. 05/31/23   Gerard Knight, MD  potassium chloride  SA (KLOR-CON  M) 20 MEQ tablet TAKE 1 TABLET(20 MEQ) BY MOUTH DAILY 01/06/23   Gerard Knight, MD  pregabalin  (LYRICA ) 75 MG capsule Take 1 capsule (75  mg total) by mouth at bedtime. For restless leg symptoms 04/26/23   Antonio Baumgarten, NP  Rivaroxaban  (XARELTO ) 15 MG TABS tablet TAKE 1 TABLET(15 MG) BY MOUTH DAILY WITH SUPPER 03/31/23   Gerard Knight, MD  torsemide  (DEMADEX ) 20 MG tablet Take 1 tablet (20 mg total) by mouth daily. 04/21/23   Gerard Knight, MD    Physical Exam: Vitals:   08/14/23 0233 08/14/23 0236 08/14/23 0300 08/14/23 0330  BP: (!) 206/118 (!) 209/106 (!) 193/117 (!) 172/87  Pulse: 94 (!) 101 100 97  Resp: (!) 31 (!) 31 (!) 30 (!) 33  Temp:      TempSrc:      SpO2: 100% 99% 98% 94%  Weight:      Height:       BP (!) 172/87   Pulse 97   Temp 97.8 F (36.6 C) (Temporal)   Resp (!) 33   Ht 5\' 1"  (1.549 m)   Wt 81 kg   SpO2 94%   BMI 33.74 kg/m   Neurology awake and alert, deconditioned and ill looking appearing ENT with mild pallor with no icterus.  Full face interface Bipap in place.  Cardiovascular with S1 and S2 present and regular with no gallops, rubs or murmurs Respiratory with bilateral diffuse rhonchi, with bilateral wheezing, and right upper zone rales. Prolonged expiratory phase. Increased work of breathing with increase use of accessory respiratory muscles.  Abdomen with no distention  No lower extremity edema   Data Reviewed:   VBG 7,2/ 64/ 44/ 24.8/ 66.9%  Na 137 K 3,8 Cl 108 bicarbonate 23, glucose 173 bun 21 cr 1,11 AST 34 ALT 26  BNP 363 High sensitive troponin 8  Lactic acid 2,1  Wbc 14.6 hgb 15.1 plt 336   Chest radiograph with hyperinflation, moderate cardiomegaly, right upper lobe patchy infiltrate with loss of lung volume and traction of the mediastinum. Left lower lobe interstitial infiltrate.   Assessment and Plan: * Right upper lobe pneumonia Complicated with severe sepsis, (acute hypoxemic respiratory failure/ high lactic acid) present on admission.   Plan to continue antibiotic therapy with Ceftriaxone  and Azithromycin . Systemic steroids.  Close blood pressure and heart rate monitoring.  Hold on IV fluids due to heart failure and risk of worsening respiratory failure and pulmonary edema.  Follow up on cultures cell count and temperature curve.   COPD with acute exacerbation (HCC) Acute hypoxemic and hypercapnic respiratory failure.   Plan to continue on Bipap, currently 14/6 with Fi02 100% generating tidal volumes of 600 and 02 saturation 97%. Continue aggressive bronchodilator therapy Inhaled and systemic corticosteroids (methylprednisolone  40 mg IV bid) Keep 02 saturation 92% or greater.   Acute on chronic diastolic CHF (congestive heart failure) (HCC) 2024 echocardiogram with preserved LV systolic function with EF 60 to 65%, mild LVH, right ventricle with normal systolic function, RVSP 60.7 mmHg, with severe dilatation of left atrium and moderate dilation right atrium, no significant valvular  disease.   Plan to continue metoprolol  and SGLT 2 inh.  Hold on diuretic therapy for now.  Follow up on repeat echocardiogram.   Essential hypertension Uncontrolled hypertension/ emergency.  Plan to continue metoprolol  and diltiazem .  Add as needed hydralazine   No IV fluids.   Paroxysmal atrial fibrillation (HCC) Continue amiodarone , metoprolol  and diltiazem  for rate control Rhythm control with amiodarone .  Continue anticoagulation with rivaroxaban .   CKD stage 3a, GFR 45-59 ml/min (HCC) Hold on diuretic therapy for now.  Follow up renal function and electrolytes in  am.  Avoid hypotension and nephrotoxic medications   Hypothyroidism Continue levothyroxine    Obesity, class 1 Calculated BMI is 33.7   Advance Care Planning:   Code Status: Full Code   Consults: none   Family Communication: no family at the bedside   Severity of Illness: The appropriate patient status for this patient is INPATIENT. Inpatient status is judged to be reasonable and necessary in order to provide the required intensity of service to ensure the patient's safety. The patient's presenting symptoms, physical exam findings, and initial radiographic and laboratory data in the context of their chronic comorbidities is felt to place them at high risk for further clinical deterioration. Furthermore, it is not anticipated that the patient will be medically stable for discharge from the hospital within 2 midnights of admission.   * I certify that at the point of admission it is my clinical judgment that the patient will require inpatient hospital care spanning beyond 2 midnights from the point of admission due to high intensity of service, high risk for further deterioration and high frequency of surveillance required.*  Author: Albertus Alt, MD 08/14/2023 3:38 AM  For on call review [email or web address].

## 2023-08-14 NOTE — Assessment & Plan Note (Addendum)
 Complicated with severe sepsis, (acute hypoxemic respiratory failure/ high lactic acid) present on admission.   Plan to continue antibiotic therapy with Ceftriaxone  and Azithromycin . Systemic steroids.  Close blood pressure and heart rate monitoring.  Hold on IV fluids due to heart failure and risk of worsening respiratory failure and pulmonary edema.  Follow up on cultures cell count and temperature curve.

## 2023-08-14 NOTE — Progress Notes (Signed)
*  PRELIMINARY RESULTS* Echocardiogram 2D Echocardiogram has been performed.  Bernis Brisker 08/14/2023, 10:41 AM

## 2023-08-14 NOTE — ED Notes (Signed)
 Unable to give PO meds at this time, pt on bi-pap

## 2023-08-14 NOTE — Progress Notes (Signed)
   08/14/23 1022  TOC Brief Assessment  Insurance and Status Reviewed  Patient has primary care physician Yes  Home environment has been reviewed Home  Prior level of function: Independent  Prior/Current Home Services No current home services  Social Drivers of Health Review SDOH reviewed no interventions necessary  Readmission risk has been reviewed Yes  Transition of care needs no transition of care needs at this time   Patient currently on BIPAP will need 3-4 days in hospital. TOC following.   Transition of Care Department [facility]) has reviewed patient and no TOC needs have been identified at this time. We will continue to monitor patient advancement through interdisciplinary progression rounds. If new patient transition needs arise, please place a TOC consult.

## 2023-08-14 NOTE — Assessment & Plan Note (Signed)
 Hold on diuretic therapy for now.  Follow up renal function and electrolytes in am.  Avoid hypotension and nephrotoxic medications

## 2023-08-14 NOTE — Assessment & Plan Note (Signed)
Calculated BMI is 33.7.

## 2023-08-14 NOTE — ED Triage Notes (Signed)
 Pt bib EMS for respiratory distress. Per EMS, pt recently diagnosed with bronchitis. EMS placed pt on C-PAP en route and administered 125mg  Solumedrol IV as well as an Albuterol  nebulizer treatment. Pt's O2 sats 84% upon arrival to ED.

## 2023-08-14 NOTE — Sepsis Progress Note (Signed)
 Following for sepsis monitoring ?

## 2023-08-14 NOTE — ED Notes (Signed)
 Admitting at bedside

## 2023-08-14 NOTE — ED Notes (Addendum)
 Daughter given update via phone, states she is traveling up to visit

## 2023-08-14 NOTE — ED Notes (Signed)
 Patient given albuterol  12.5 mg and atrovent  0.5 mg via aerogen while patient on BiPAP.

## 2023-08-14 NOTE — Assessment & Plan Note (Signed)
 Continue amiodarone , metoprolol  and diltiazem  for rate control Rhythm control with amiodarone .  Continue anticoagulation with rivaroxaban .

## 2023-08-15 DIAGNOSIS — I480 Paroxysmal atrial fibrillation: Secondary | ICD-10-CM | POA: Diagnosis not present

## 2023-08-15 DIAGNOSIS — J189 Pneumonia, unspecified organism: Secondary | ICD-10-CM

## 2023-08-15 DIAGNOSIS — J441 Chronic obstructive pulmonary disease with (acute) exacerbation: Secondary | ICD-10-CM

## 2023-08-15 DIAGNOSIS — N1831 Chronic kidney disease, stage 3a: Secondary | ICD-10-CM | POA: Diagnosis not present

## 2023-08-15 DIAGNOSIS — I10 Essential (primary) hypertension: Secondary | ICD-10-CM

## 2023-08-15 LAB — BASIC METABOLIC PANEL WITH GFR
Anion gap: 6 (ref 5–15)
BUN: 19 mg/dL (ref 8–23)
CO2: 26 mmol/L (ref 22–32)
Calcium: 9.8 mg/dL (ref 8.9–10.3)
Chloride: 109 mmol/L (ref 98–111)
Creatinine, Ser: 0.79 mg/dL (ref 0.44–1.00)
GFR, Estimated: >60 mL/min (ref >60)
Glucose, Bld: 127 mg/dL — ABNORMAL HIGH (ref 70–99)
Potassium: 4.2 mmol/L (ref 3.5–5.1)
Sodium: 141 mmol/L (ref 135–145)

## 2023-08-15 LAB — CBC WITH DIFFERENTIAL/PLATELET
Abs Immature Granulocytes: 0.14 10*3/uL — ABNORMAL HIGH (ref 0.00–0.07)
Basophils Absolute: 0 10*3/uL (ref 0.0–0.1)
Basophils Relative: 0 %
Eosinophils Absolute: 0 10*3/uL (ref 0.0–0.5)
Eosinophils Relative: 0 %
HCT: 36.8 % (ref 36.0–46.0)
Hemoglobin: 11.9 g/dL — ABNORMAL LOW (ref 12.0–15.0)
Immature Granulocytes: 1 %
Lymphocytes Relative: 2 %
Lymphs Abs: 0.4 10*3/uL — ABNORMAL LOW (ref 0.7–4.0)
MCH: 33.5 pg (ref 26.0–34.0)
MCHC: 32.3 g/dL (ref 30.0–36.0)
MCV: 103.7 fL — ABNORMAL HIGH (ref 80.0–100.0)
Monocytes Absolute: 0.3 10*3/uL (ref 0.1–1.0)
Monocytes Relative: 2 %
Neutro Abs: 19.7 10*3/uL — ABNORMAL HIGH (ref 1.7–7.7)
Neutrophils Relative %: 95 %
Platelets: 208 10*3/uL (ref 150–400)
RBC: 3.55 MIL/uL — ABNORMAL LOW (ref 3.87–5.11)
RDW: 15.4 % (ref 11.5–15.5)
WBC: 20.6 10*3/uL — ABNORMAL HIGH (ref 4.0–10.5)
nRBC: 0 % (ref 0.0–0.2)

## 2023-08-15 LAB — HEMOGLOBIN AND HEMATOCRIT, BLOOD
HCT: 41.3 % (ref 36.0–46.0)
Hemoglobin: 13.4 g/dL (ref 12.0–15.0)

## 2023-08-15 LAB — MAGNESIUM: Magnesium: 2.2 mg/dL (ref 1.7–2.4)

## 2023-08-15 MED ORDER — IPRATROPIUM-ALBUTEROL 0.5-2.5 (3) MG/3ML IN SOLN
3.0000 mL | Freq: Three times a day (TID) | RESPIRATORY_TRACT | Status: AC
  Administered 2023-08-16 – 2023-08-18 (×9): 3 mL via RESPIRATORY_TRACT
  Filled 2023-08-15 (×9): qty 3

## 2023-08-15 MED ORDER — HYDRALAZINE HCL 25 MG PO TABS
25.0000 mg | ORAL_TABLET | Freq: Three times a day (TID) | ORAL | Status: DC
  Administered 2023-08-15 – 2023-08-18 (×10): 25 mg via ORAL
  Filled 2023-08-15 (×11): qty 1

## 2023-08-15 NOTE — Hospital Course (Signed)
 78 y.o. female with medical history significant of atrial fibrillation, hypothyroidism, heart failure, COPD, and hyperlipidemia, who presented with dyspnea.  Pt reported 4 days of progressive and worsening dyspnea, associated with cough, increase sputum production and wheezing. Severe weakness. She used her inhalers and nebulizer with no significant improvement in her symptoms. Positive fever but no chills.  Pt said she had recently been treated for bronchitis at urgent care with steroids and antibiotics. She initially felt better but now symptoms have returned.   In ED her 02 saturation was 84%, she had increased work of breathing and placed on bipap.  She is being admitted for pneumonia, COPD exacerbation and acute HFpEF.

## 2023-08-15 NOTE — Progress Notes (Signed)
 PROGRESS NOTE   Diane Cohen  GNF:[id number] DOB: 16-Dec-1945 DOA: 08/14/2023 PCP: Omie Bickers, MD   Chief Complaint  Patient presents with   Respiratory Distress   Level of care: Stepdown  Brief Admission History:  78 y.o. female with medical history significant of atrial fibrillation, hypothyroidism, heart failure, COPD, and hyperlipidemia, who presented with dyspnea.  Pt reported 4 days of progressive and worsening dyspnea, associated with cough, increase sputum production and wheezing. Severe weakness. She used her inhalers and nebulizer with no significant improvement in her symptoms. Positive fever but no chills.  Pt said she had recently been treated for bronchitis at urgent care with steroids and antibiotics. She initially felt better but now symptoms have returned.   In ED her 02 saturation was 84%, she had increased work of breathing and placed on bipap.  She is being admitted for pneumonia, COPD exacerbation and acute HFpEF.     Assessment and Plan:  Right upper lobe pneumonia Sepsis ruled out Elevated lactate thought secondary to hypoxia  Continue treatments Continue Ceftriaxone /Azithromycin . Continue IV solumedrol  CKD stage 3a, GFR 45-59 ml/min Renal function stable, following Avoid hypotension and nephrotoxic medications   COPD with acute exacerbation  Acute hypoxemic and hypercapnic respiratory failure.  continue on Bipap nightly and PRN Continue scheduled bronchodilator therapy Inhaled and systemic corticosteroids (methylprednisolone  40 mg IV bid)  Acute on chronic diastolic CHF (congestive heart failure) 2024 echocardiogram with preserved LV systolic function with EF 60 to 65%, mild LVH, right ventricle with normal systolic function, RVSP 60.7 mmHg, with severe dilatation of left atrium and moderate dilation right atrium, no significant valvular disease.  Plan to continue metoprolol  and dapagliflozin   S/p lasix  IV on 6/7, resumed daily home oral torsemide  on  6/9 Follow up on repeat echocardiogram: Left ventricular ejection fraction, by estimation, is 60 to 65%. The  left ventricle has normal function. The left ventricle has no regional wall motion abnormalities. There is mild left ventricular hypertrophy. Left ventricular diastolic parameters were normal. Aortic valve sclerosis is present.   Paroxysmal atrial fibrillation Continue amiodarone , metoprolol  and diltiazem  for rate control Rhythm control with amiodarone .  Continue anticoagulation with rivaroxaban .   Hypothyroidism Continue levothyroxine    Essential hypertension Hypertensive Urgency BPs much improved after resuming home metoprolol  and diltiazem .  As needed hydralazine    Leukocytosis - bump in WBC due to leukemoid reaction from IV steroid use  DVT prophylaxis: rivaroxaban  Code Status: Full  Family Communication:  Disposition:    Consultants:   Procedures:   Antimicrobials:  Ceftriaxone  6/7>> Azithromycin  6/7>>  Subjective: Pt reporting that she is less SOB, she is off bipap and she is attempting to start eating again.   Objective: Vitals:   08/15/23 0400 08/15/23 0411 08/15/23 0600 08/15/23 0800  BP: (!) 119/54  (!) 146/77 (!) 170/126  Pulse: 75 76 71 85  Resp: (!) 23 (!) 23 (!) 21 20  Temp:  98 F (36.7 C)    TempSrc:  Axillary    SpO2: 95% 96% 97% 91%  Weight:      Height:        Intake/Output Summary (Last 24 hours) at 08/15/2023 1026 Last data filed at 08/15/2023 0930 Gross per 24 hour  Intake 3448.33 ml  Output 1625 ml  Net 1823.33 ml   Filed Weights   08/14/23 0210 08/14/23 1330  Weight: 81 kg 79.4 kg   Examination:  General exam: Appears calm and comfortable  Respiratory system: Clear to auscultation. Respiratory effort normal. Cardiovascular system:  normal S1 & S2 heard. No JVD, murmurs, rubs, gallops or clicks. No pedal edema. Gastrointestinal system: Abdomen is nondistended, soft and nontender. No organomegaly or masses felt. Normal bowel  sounds heard. Central nervous system: Alert and oriented. No focal neurological deficits. Extremities: Symmetric 5 x 5 power. Skin: No rashes, lesions or ulcers. Psychiatry: Judgement and insight appear normal. Mood & affect appropriate.   Data Reviewed: I have personally reviewed following labs and imaging studies  CBC: Recent Labs  Lab 08/14/23 0207 08/14/23 0702 08/15/23 0324  WBC 14.6* 16.0* 20.6*  NEUTROABS 8.9*  --  19.7*  HGB 15.1* 13.6 11.9*  HCT 47.9* 42.8 36.8  MCV 104.8* 104.9* 103.7*  PLT 336 215 208    Basic Metabolic Panel: Recent Labs  Lab 08/14/23 0207 08/14/23 0702 08/15/23 0324  NA 137 143 141  K 3.8 3.8 4.2  CL 108 109 109  CO2 23 22 26   GLUCOSE 173* 182* 127*  BUN 21 21 19   CREATININE 1.11* 1.01* 0.79  CALCIUM  10.1 9.9 9.8  MG  --   --  2.2    CBG: No results for input(s): "GLUCAP" in the last 168 hours.  Recent Results (from the past 240 hours)  Blood culture (routine x 2)     Status: None (Preliminary result)   Collection Time: 08/14/23  2:52 AM   Specimen: BLOOD RIGHT HAND  Result Value Ref Range Status   Specimen Description BLOOD RIGHT HAND  Final   Special Requests   Final    BOTTLES DRAWN AEROBIC AND ANAEROBIC Blood Culture adequate volume   Culture   Final    NO GROWTH 1 DAY Performed at [facility], [address], [place], [place] [address]    Report Status PENDING  Incomplete  Blood culture (routine x 2)     Status: None (Preliminary result)   Collection Time: 08/14/23  3:20 AM   Specimen: BLOOD RIGHT HAND  Result Value Ref Range Status   Specimen Description BLOOD RIGHT HAND  Final   Special Requests   Final    BOTTLES DRAWN AEROBIC ONLY Blood Culture adequate volume   Culture   Final    NO GROWTH 1 DAY Performed at [facility], [address], [place], [place] [address]    Report Status PENDING  Incomplete     Radiology Studies: ECHOCARDIOGRAM COMPLETE Result Date: 08/14/2023    ECHOCARDIOGRAM REPORT   Patient  Name:   Diane Cohen Date of Exam: 08/14/2023 Medical Rec #:  [id number]     Height:       61.0 in Accession #:    [id number]    Weight:       178.6 lb Date of Birth:  03/26/1945     BSA:          1.800 m Patient Age:    77 years      BP:           131/59 mmHg Patient Gender: F             HR:           87 bpm. Exam Location:  [facility] Procedure: 2D Echo, Cardiac Doppler and Color Doppler (Both Spectral and Color            Flow Doppler were utilized during procedure). Indications:    Dyspnea R06.00  History:        Patient has prior history of Echocardiogram examinations. CHF,  COPD, Arrythmias:Atrial Fibrillation; Risk Factors:Hypertension.  Sonographer:    Denese Finn RCS Referring Phys: [id number] MAURICIO DANIEL ARRIEN IMPRESSIONS  1. Left ventricular ejection fraction, by estimation, is 60 to 65%. The left ventricle has normal function. The left ventricle has no regional wall motion abnormalities. There is mild left ventricular hypertrophy. Left ventricular diastolic parameters were normal.  2. Right ventricular systolic function is normal. The right ventricular size is normal.  3. Left atrial size was mildly dilated.  4. The mitral valve is abnormal. Trivial mitral valve regurgitation. No evidence of mitral stenosis.  5. Mean gradient 5 peak 10 mmhg but AVA 2.2 cm2. The aortic valve is tricuspid. There is moderate calcification of the aortic valve. There is moderate thickening of the aortic valve. Aortic valve regurgitation is not visualized. Aortic valve sclerosis is present, with no evidence of aortic valve stenosis.  6. The inferior vena cava is normal in size with greater than 50% respiratory variability, suggesting right atrial pressure of 3 mmHg.  7. Cannot exclude a small PFO. FINDINGS  Left Ventricle: Left ventricular ejection fraction, by estimation, is 60 to 65%. The left ventricle has normal function. The left ventricle has no regional wall motion abnormalities. Strain was performed  and the global longitudinal strain is indeterminate. The left ventricular internal cavity size was normal in size. There is mild left ventricular hypertrophy. Left ventricular diastolic parameters were normal. Right Ventricle: The right ventricular size is normal. No increase in right ventricular wall thickness. Right ventricular systolic function is normal. Left Atrium: Left atrial size was mildly dilated. Right Atrium: Right atrial size was normal in size. Pericardium: There is no evidence of pericardial effusion. Mitral Valve: The mitral valve is abnormal. There is mild thickening of the mitral valve leaflet(s). There is mild calcification of the mitral valve leaflet(s). Trivial mitral valve regurgitation. No evidence of mitral valve stenosis. Tricuspid Valve: The tricuspid valve is normal in structure. Tricuspid valve regurgitation is mild . No evidence of tricuspid stenosis. Aortic Valve: Mean gradient 5 peak 10 mmhg but AVA 2.2 cm2. The aortic valve is tricuspid. There is moderate calcification of the aortic valve. There is moderate thickening of the aortic valve. Aortic valve regurgitation is not visualized. Aortic valve sclerosis is present, with no evidence of aortic valve stenosis. Aortic valve mean gradient measures 5.0 mmHg. Aortic valve peak gradient measures 9.5 mmHg. Aortic valve area, by VTI measures 2.25 cm. Pulmonic Valve: The pulmonic valve was normal in structure. Pulmonic valve regurgitation is not visualized. No evidence of pulmonic stenosis. Aorta: The aortic root is normal in size and structure. Venous: The inferior vena cava is normal in size with greater than 50% respiratory variability, suggesting right atrial pressure of 3 mmHg. IAS/Shunts: Cannot exclude a small PFO. Additional Comments: 3D was performed not requiring image post processing on an independent workstation and was indeterminate.  LEFT VENTRICLE PLAX 2D LVIDd:         4.10 cm   Diastology LVIDs:         2.70 cm   LV e'  medial:    6.96 cm/s LV PW:         1.00 cm   LV E/e' medial:  10.8 LV IVS:        1.20 cm   LV e' lateral:   11.70 cm/s LVOT diam:     1.80 cm   LV E/e' lateral: 6.4 LV SV:         62 LV SV Index:   34  LVOT Area:     2.54 cm  RIGHT VENTRICLE RV S prime:     19.70 cm/s TAPSE (M-mode): 2.6 cm LEFT ATRIUM             Index        RIGHT ATRIUM           Index LA diam:        4.00 cm 2.22 cm/m   RA Area:     13.30 cm LA Vol (A2C):   50.0 ml 27.78 ml/m  RA Volume:   30.10 ml  16.72 ml/m LA Vol (A4C):   69.2 ml 38.45 ml/m LA Biplane Vol: 59.1 ml 32.83 ml/m  AORTIC VALVE AV Area (Vmax):    2.07 cm AV Area (Vmean):   2.24 cm AV Area (VTI):     2.25 cm AV Vmax:           154.00 cm/s AV Vmean:          107.000 cm/s AV VTI:            0.276 m AV Peak Grad:      9.5 mmHg AV Mean Grad:      5.0 mmHg LVOT Vmax:         125.50 cm/s LVOT Vmean:        94.200 cm/s LVOT VTI:          0.244 m LVOT/AV VTI ratio: 0.88  AORTA Ao Root diam: 3.10 cm MITRAL VALVE MV Area (PHT): 3.48 cm    SHUNTS MV Decel Time: 218 msec    Systemic VTI:  0.24 m MV E velocity: 75.20 cm/s  Systemic Diam: 1.80 cm MV A velocity: 58.50 cm/s MV E/A ratio:  1.29 Janelle Mediate MD Electronically signed by Janelle Mediate MD Signature Date/Time: 08/14/2023/1:10:05 PM    Final    DG Chest Portable 1 View Result Date: 08/14/2023 CLINICAL DATA:  Respiratory distress EXAM: PORTABLE CHEST 1 VIEW COMPARISON:  06/09/2023, 07/16/2023 FINDINGS: Cardiac shadow is within normal limits. Near complete consolidation of the right upper lobe is noted. Left perihilar opacity is noted as well. IMPRESSION: Increasing right upper lobe consolidation consistent with focal infiltrate. Left perihilar opacity and left retrocardiac opacity. Electronically Signed   By: Violeta Grey M.D.   On: 08/14/2023 02:34    Scheduled Meds:  amiodarone   200 mg Oral Daily   atorvastatin   20 mg Oral Daily   budesonide  (PULMICORT ) nebulizer solution  0.25 mg Nebulization BID   Chlorhexidine   Gluconate Cloth  6 each Topical Q0600   dapagliflozin  propanediol  10 mg Oral Daily   diltiazem   180 mg Oral Daily   DULoxetine   60 mg Oral Daily   ipratropium-albuterol   3 mL Nebulization Q6H   levothyroxine   150 mcg Oral Q0600   methylPREDNISolone  (SOLU-MEDROL ) injection  40 mg Intravenous Q12H   metoprolol  succinate  25 mg Oral Daily   potassium chloride   40 mEq Oral Q supper   pregabalin   75 mg Oral QHS   Rivaroxaban   15 mg Oral Q supper   torsemide   20 mg Oral Daily   Continuous Infusions:  azithromycin  500 mg (08/15/23 0833)   cefTRIAXone  (ROCEPHIN )  IV 2 g (08/15/23 0738)    LOS: 1 day   Critical Care Procedure Note Authorized and Performed by: Olga Berthold MD  Total Critical Care time:  50 mins  Due to a high probability of clinically significant, life threatening deterioration, the patient required my highest level of preparedness to intervene  emergently and I personally spent this critical care time directly and personally managing the patient.  This critical care time included obtaining a history; examining the patient, pulse oximetry; ordering and review of studies; arranging urgent treatment with development of a management plan; evaluation of patient's response of treatment; frequent reassessment; and discussions with other providers.  This critical care time was performed to assess and manage the high probability of imminent and life threatening deterioration that could result in multi-organ failure.  It was exclusive of separately billable procedures and treating other patients and teaching time.   Faustino Hook, MD How to contact the [facility] Attending or Consulting provider 7A - 7P or covering provider during after hours 7P -7A, for this patient?  Check the care team in [facility] and look for a) attending/consulting [facility] provider listed and b) the [facility] team listed Log into [email or web address] to find provider on call.  Locate the [facility] provider you are looking for under [facility]  and page to a number that you can be directly reached. If you still have difficulty reaching the provider, please page the [facility] (Director on Call) for the Hospitalists listed on amion for assistance.  08/15/2023, 10:26 AM

## 2023-08-15 NOTE — Progress Notes (Signed)
   08/15/23 2325  Vent Select  Invasive or Noninvasive Noninvasive  Adult Vent Y  Adult Ventilator Settings  Vent Type Servo-air  Vent Mode BIPAP  Set Rate 12 bmp  FiO2 (%) 50 %  I Time 0.9 Sec(s)  IPAP 10 cmH20  EPAP 5 cmH20  Pressure Support 5 cmH20  PEEP 5 cmH20  Adult Ventilator Measurements  Peak Airway Pressure 11 L/min  Mean Airway Pressure 6.5 cmH20  Resp Rate Spontaneous 10 br/min  Resp Rate Total 22 br/min  Exhaled Vt 613 mL  Measured Ve 12 L  Auto PEEP 0 cmH20  Total PEEP 4.7 cmH20  SpO2 95 %  Adult Ventilator Alarms  Alarms On Y  Ve High Alarm 24 L/min  Ve Low Alarm 4 L/min  Resp Rate High Alarm 38 br/min  Resp Rate Low Alarm 8  PEEP Low Alarm 3 cmH2O  Press High Alarm 30 cmH2O  T Apnea 20 sec(s)  VAP Prevention  HOB> 30 Degrees Y  Breath Sounds  Bilateral Breath Sounds Clear  R Upper  Breath Sounds Clear  L Upper Breath Sounds Clear  R Lower Breath Sounds Clear  L Lower Breath Sounds Clear  Vent Respiratory Assessment  Respiratory Pattern Regular;Unlabored

## 2023-08-15 NOTE — Progress Notes (Signed)
 Removed from Bipap at this time. Patient placed on [place] at 4lpm.

## 2023-08-16 ENCOUNTER — Inpatient Hospital Stay (HOSPITAL_COMMUNITY)

## 2023-08-16 DIAGNOSIS — J441 Chronic obstructive pulmonary disease with (acute) exacerbation: Secondary | ICD-10-CM | POA: Diagnosis not present

## 2023-08-16 DIAGNOSIS — I480 Paroxysmal atrial fibrillation: Secondary | ICD-10-CM | POA: Diagnosis not present

## 2023-08-16 DIAGNOSIS — I5033 Acute on chronic diastolic (congestive) heart failure: Secondary | ICD-10-CM | POA: Diagnosis not present

## 2023-08-16 DIAGNOSIS — J189 Pneumonia, unspecified organism: Secondary | ICD-10-CM | POA: Diagnosis not present

## 2023-08-16 LAB — CBC
HCT: 37.2 % (ref 36.0–46.0)
Hemoglobin: 11.9 g/dL — ABNORMAL LOW (ref 12.0–15.0)
MCH: 33.1 pg (ref 26.0–34.0)
MCHC: 32 g/dL (ref 30.0–36.0)
MCV: 103.6 fL — ABNORMAL HIGH (ref 80.0–100.0)
Platelets: 214 10*3/uL (ref 150–400)
RBC: 3.59 MIL/uL — ABNORMAL LOW (ref 3.87–5.11)
RDW: 15.8 % — ABNORMAL HIGH (ref 11.5–15.5)
WBC: 16.6 10*3/uL — ABNORMAL HIGH (ref 4.0–10.5)
nRBC: 0 % (ref 0.0–0.2)

## 2023-08-16 LAB — MRSA NEXT GEN BY PCR, NASAL: MRSA by PCR Next Gen: NOT DETECTED

## 2023-08-16 NOTE — Plan of Care (Signed)

## 2023-08-16 NOTE — Progress Notes (Signed)
 PROGRESS NOTE   Diane Cohen  XBJ:[id number] DOB: Dec 31, 1945 DOA: 08/14/2023 PCP: Omie Bickers, MD   Chief Complaint  Patient presents with   Respiratory Distress   Level of care: Stepdown  Brief Admission History:  78 y.o. female with medical history significant of atrial fibrillation, hypothyroidism, heart failure, COPD, and hyperlipidemia, who presented with dyspnea.  Pt reported 4 days of progressive and worsening dyspnea, associated with cough, increase sputum production and wheezing. Severe weakness. She used her inhalers and nebulizer with no significant improvement in her symptoms. Positive fever but no chills.  Pt said she had recently been treated for bronchitis at urgent care with steroids and antibiotics. She initially felt better but now symptoms have returned.   In ED her 02 saturation was 84%, she had increased work of breathing and placed on bipap.  She is being admitted for pneumonia, COPD exacerbation and acute HFpEF.     Assessment and Plan:  Right upper lobe pneumonia - clinically improving with treatments Sepsis ruled out Elevated lactate thought secondary to hypoxia  Continue treatments Continue Ceftriaxone /Azithromycin . Continue IV solumedrol   CKD stage 3a, GFR 45-59 ml/min Renal function stable, following Avoid hypotension and nephrotoxic medications   COPD with acute exacerbation  Acute hypoxemic and hypercapnic respiratory failure.  continue on Bipap nightly, has not required bipap during day in last 24 hours Continue scheduled bronchodilator therapy Inhaled and systemic corticosteroids (methylprednisolone  40 mg IV bid)  Acute on chronic diastolic CHF (congestive heart failure) 2024 echocardiogram with preserved LV systolic function with EF 60 to 65%, mild LVH, right ventricle with normal systolic function, RVSP 60.7 mmHg, with severe dilatation of left atrium and moderate dilation right atrium, no significant valvular disease.  Plan to continue  metoprolol  and dapagliflozin   S/p lasix  IV on 6/7, resumed daily home oral torsemide  on 6/9 Follow up on repeat echocardiogram: Left ventricular ejection fraction, by estimation, is 60 to 65%. The  left ventricle has normal function. The left ventricle has no regional wall motion abnormalities. There is mild left ventricular hypertrophy. Left ventricular diastolic parameters were normal. Aortic valve sclerosis is present.   Filed Weights   08/14/23 0210 08/14/23 1330  Weight: 81 kg 79.4 kg    Intake/Output Summary (Last 24 hours) at 08/16/2023 0858 Last data filed at 08/15/2023 1800 Gross per 24 hour  Intake 1230 ml  Output 1700 ml  Net -470 ml   Paroxysmal atrial fibrillation Continue amiodarone , metoprolol  and diltiazem  for rate control Rhythm control with amiodarone .  Continue anticoagulation with rivaroxaban  for full dose anticoagulation  Hypothyroidism Continue levothyroxine    Essential hypertension Hypertensive Urgency - resolved  BPs much improved after resuming home metoprolol  and diltiazem .  As needed hydralazine    Leukocytosis - bump in WBC due to leukemoid reaction from IV steroid use but trending down now  DVT prophylaxis: rivaroxaban  Code Status: Full  Family Communication:  Disposition: transfer out of SDU to tele   Consultants:   Procedures:   Antimicrobials:  Ceftriaxone  6/7>> Azithromycin  6/7>>  Subjective: Less SOB today, feels very weak, and very SOB with trying to move around.    Objective: Vitals:   08/16/23 0711 08/16/23 0737 08/16/23 0830 08/16/23 0833  BP:      Pulse: 71     Resp: 17     Temp:  98 F (36.7 C)    TempSrc:  Oral    SpO2: 93%  96% 99%  Weight:      Height:  Intake/Output Summary (Last 24 hours) at 08/16/2023 0857 Last data filed at 08/15/2023 1800 Gross per 24 hour  Intake 1230 ml  Output 1700 ml  Net -470 ml   Filed Weights   08/14/23 0210 08/14/23 1330  Weight: 81 kg 79.4 kg   Examination:  General exam:  Appears calm and comfortable  Respiratory system: bibasilar crackles, on nasal cannula oxygen . No increased work of breathing.  Cardiovascular system: normal S1 & S2 heard. No JVD, murmurs, rubs, gallops or clicks. No pedal edema. Gastrointestinal system: Abdomen is nondistended, soft and nontender. No organomegaly or masses felt. Normal bowel sounds heard. Central nervous system: Alert and oriented. No focal neurological deficits. Extremities: 1+ pretibial edema BLEs. Symmetric 5 x 5 power. Skin: No rashes, lesions or ulcers. Psychiatry: Judgement and insight appear normal. Mood & affect appropriate.   Data Reviewed: I have personally reviewed following labs and imaging studies  CBC: Recent Labs  Lab 08/14/23 0207 08/14/23 0702 08/15/23 0324 08/15/23 1157 08/16/23 0326  WBC 14.6* 16.0* 20.6*  --  16.6*  NEUTROABS 8.9*  --  19.7*  --   --   HGB 15.1* 13.6 11.9* 13.4 11.9*  HCT 47.9* 42.8 36.8 41.3 37.2  MCV 104.8* 104.9* 103.7*  --  103.6*  PLT 336 215 208  --  214    Basic Metabolic Panel: Recent Labs  Lab 08/14/23 0207 08/14/23 0702 08/15/23 0324  NA 137 143 141  K 3.8 3.8 4.2  CL 108 109 109  CO2 23 22 26   GLUCOSE 173* 182* 127*  BUN 21 21 19   CREATININE 1.11* 1.01* 0.79  CALCIUM  10.1 9.9 9.8  MG  --   --  2.2    CBG: No results for input(s): "GLUCAP" in the last 168 hours.  Recent Results (from the past 240 hours)  Blood culture (routine x 2)     Status: None (Preliminary result)   Collection Time: 08/14/23  2:52 AM   Specimen: BLOOD RIGHT HAND  Result Value Ref Range Status   Specimen Description BLOOD RIGHT HAND  Final   Special Requests   Final    BOTTLES DRAWN AEROBIC AND ANAEROBIC Blood Culture adequate volume   Culture   Final    NO GROWTH 2 DAYS Performed at [facility] [address] [place], [place] [address]    Report Status PENDING  Incomplete  Blood culture (routine x 2)     Status: None (Preliminary result)   Collection Time: 08/14/23   3:20 AM   Specimen: BLOOD RIGHT HAND  Result Value Ref Range Status   Specimen Description BLOOD RIGHT HAND  Final   Special Requests   Final    BOTTLES DRAWN AEROBIC ONLY Blood Culture adequate volume   Culture   Final    NO GROWTH 2 DAYS Performed at [facility], [address], [place], [place] [address]    Report Status PENDING  Incomplete     Radiology Studies: DG CHEST PORT 1 VIEW Result Date: 08/16/2023 CLINICAL DATA:  COPD EXAM: PORTABLE CHEST 1 VIEW COMPARISON:  Chest x-ray performed August 14, 2023 FINDINGS: Previously observed right upper lung zone airspace infiltrate is improved. Heart mediastinum are not significantly changed. Interstitial changes, improving. No significant pleural effusion or pneumothorax. IMPRESSION: 1. Improvement in right upper lung zone airspace infiltrate. Electronically Signed   By: Reagan Camera M.D.   On: 08/16/2023 08:24   ECHOCARDIOGRAM COMPLETE Result Date: 08/14/2023    ECHOCARDIOGRAM REPORT   Patient Name:   [facility]  [place] Date of Exam: 08/14/2023 Medical Rec #:  [id number]     Height:       61.0 in Accession #:    [id number]    Weight:       178.6 lb Date of Birth:  04-Jul-1945     BSA:          1.800 m Patient Age:    77 years      BP:           131/59 mmHg Patient Gender: F             HR:           87 bpm. Exam Location:  [facility] Procedure: 2D Echo, Cardiac Doppler and Color Doppler (Both Spectral and Color            Flow Doppler were utilized during procedure). Indications:    Dyspnea R06.00  History:        Patient has prior history of Echocardiogram examinations. CHF,                 COPD, Arrythmias:Atrial Fibrillation; Risk Factors:Hypertension.  Sonographer:    Denese Finn RCS Referring Phys: [id number] MAURICIO DANIEL ARRIEN IMPRESSIONS  1. Left ventricular ejection fraction, by estimation, is 60 to 65%. The left ventricle has normal function. The left ventricle has no regional wall motion abnormalities. There is mild left ventricular hypertrophy. Left  ventricular diastolic parameters were normal.  2. Right ventricular systolic function is normal. The right ventricular size is normal.  3. Left atrial size was mildly dilated.  4. The mitral valve is abnormal. Trivial mitral valve regurgitation. No evidence of mitral stenosis.  5. Mean gradient 5 peak 10 mmhg but AVA 2.2 cm2. The aortic valve is tricuspid. There is moderate calcification of the aortic valve. There is moderate thickening of the aortic valve. Aortic valve regurgitation is not visualized. Aortic valve sclerosis is present, with no evidence of aortic valve stenosis.  6. The inferior vena cava is normal in size with greater than 50% respiratory variability, suggesting right atrial pressure of 3 mmHg.  7. Cannot exclude a small PFO. FINDINGS  Left Ventricle: Left ventricular ejection fraction, by estimation, is 60 to 65%. The left ventricle has normal function. The left ventricle has no regional wall motion abnormalities. Strain was performed and the global longitudinal strain is indeterminate. The left ventricular internal cavity size was normal in size. There is mild left ventricular hypertrophy. Left ventricular diastolic parameters were normal. Right Ventricle: The right ventricular size is normal. No increase in right ventricular wall thickness. Right ventricular systolic function is normal. Left Atrium: Left atrial size was mildly dilated. Right Atrium: Right atrial size was normal in size. Pericardium: There is no evidence of pericardial effusion. Mitral Valve: The mitral valve is abnormal. There is mild thickening of the mitral valve leaflet(s). There is mild calcification of the mitral valve leaflet(s). Trivial mitral valve regurgitation. No evidence of mitral valve stenosis. Tricuspid Valve: The tricuspid valve is normal in structure. Tricuspid valve regurgitation is mild . No evidence of tricuspid stenosis. Aortic Valve: Mean gradient 5 peak 10 mmhg but AVA 2.2 cm2. The aortic valve is  tricuspid. There is moderate calcification of the aortic valve. There is moderate thickening of the aortic valve. Aortic valve regurgitation is not visualized. Aortic valve sclerosis is present, with no evidence of aortic valve stenosis. Aortic valve mean gradient measures 5.0 mmHg. Aortic valve peak gradient measures 9.5 mmHg. Aortic valve area, by  VTI measures 2.25 cm. Pulmonic Valve: The pulmonic valve was normal in structure. Pulmonic valve regurgitation is not visualized. No evidence of pulmonic stenosis. Aorta: The aortic root is normal in size and structure. Venous: The inferior vena cava is normal in size with greater than 50% respiratory variability, suggesting right atrial pressure of 3 mmHg. IAS/Shunts: Cannot exclude a small PFO. Additional Comments: 3D was performed not requiring image post processing on an independent workstation and was indeterminate.  LEFT VENTRICLE PLAX 2D LVIDd:         4.10 cm   Diastology LVIDs:         2.70 cm   LV e' medial:    6.96 cm/s LV PW:         1.00 cm   LV E/e' medial:  10.8 LV IVS:        1.20 cm   LV e' lateral:   11.70 cm/s LVOT diam:     1.80 cm   LV E/e' lateral: 6.4 LV SV:         62 LV SV Index:   34 LVOT Area:     2.54 cm  RIGHT VENTRICLE RV S prime:     19.70 cm/s TAPSE (M-mode): 2.6 cm LEFT ATRIUM             Index        RIGHT ATRIUM           Index LA diam:        4.00 cm 2.22 cm/m   RA Area:     13.30 cm LA Vol (A2C):   50.0 ml 27.78 ml/m  RA Volume:   30.10 ml  16.72 ml/m LA Vol (A4C):   69.2 ml 38.45 ml/m LA Biplane Vol: 59.1 ml 32.83 ml/m  AORTIC VALVE AV Area (Vmax):    2.07 cm AV Area (Vmean):   2.24 cm AV Area (VTI):     2.25 cm AV Vmax:           154.00 cm/s AV Vmean:          107.000 cm/s AV VTI:            0.276 m AV Peak Grad:      9.5 mmHg AV Mean Grad:      5.0 mmHg LVOT Vmax:         125.50 cm/s LVOT Vmean:        94.200 cm/s LVOT VTI:          0.244 m LVOT/AV VTI ratio: 0.88  AORTA Ao Root diam: 3.10 cm MITRAL VALVE MV Area (PHT):  3.48 cm    SHUNTS MV Decel Time: 218 msec    Systemic VTI:  0.24 m MV E velocity: 75.20 cm/s  Systemic Diam: 1.80 cm MV A velocity: 58.50 cm/s MV E/A ratio:  1.29 Janelle Mediate MD Electronically signed by Janelle Mediate MD Signature Date/Time: 08/14/2023/1:10:05 PM    Final     Scheduled Meds:  amiodarone   200 mg Oral Daily   atorvastatin   20 mg Oral Daily   budesonide  (PULMICORT ) nebulizer solution  0.25 mg Nebulization BID   Chlorhexidine  Gluconate Cloth  6 each Topical Q0600   dapagliflozin  propanediol  10 mg Oral Daily   diltiazem   180 mg Oral Daily   DULoxetine   60 mg Oral Daily   hydrALAZINE   25 mg Oral Q8H   ipratropium-albuterol   3 mL Nebulization TID   levothyroxine   150 mcg Oral Q0600   methylPREDNISolone  (SOLU-MEDROL ) injection  40 mg Intravenous Q12H   metoprolol  succinate  25 mg Oral Daily   potassium chloride   40 mEq Oral Q supper   pregabalin   75 mg Oral QHS   Rivaroxaban   15 mg Oral Q supper   torsemide   20 mg Oral Daily   Continuous Infusions:  azithromycin  500 mg (08/16/23 0831)   cefTRIAXone  (ROCEPHIN )  IV 2 g (08/16/23 0709)    LOS: 2 days   Critical Care Procedure Note Authorized and Performed by: Olga Berthold MD  Total Critical Care time:  53 mins  Due to a high probability of clinically significant, life threatening deterioration, the patient required my highest level of preparedness to intervene emergently and I personally spent this critical care time directly and personally managing the patient.  This critical care time included obtaining a history; examining the patient, pulse oximetry; ordering and review of studies; arranging urgent treatment with development of a management plan; evaluation of patient's response of treatment; frequent reassessment; and discussions with other providers.  This critical care time was performed to assess and manage the high probability of imminent and life threatening deterioration that could result in multi-organ failure.  It was  exclusive of separately billable procedures and treating other patients and teaching time.   Faustino Hook, MD How to contact the [facility] Attending or Consulting provider 7A - 7P or covering provider during after hours 7P -7A, for this patient?  Check the care team in [facility] and look for a) attending/consulting [facility] provider listed and b) the [facility] team listed Log into [email or web address] to find provider on call.  Locate the [facility] provider you are looking for under [facility] and page to a number that you can be directly reached. If you still have difficulty reaching the provider, please page the [facility] (Director on Call) for the Hospitalists listed on amion for assistance.  08/16/2023, 8:57 AM

## 2023-08-16 NOTE — Progress Notes (Signed)
   08/16/23 2241  BiPAP/CPAP/SIPAP  $ Non-Invasive Ventilator  Non-Invasive Vent Set Up  BiPAP/CPAP/SIPAP Pt Type Adult  BiPAP/CPAP/SIPAP DREAMSTATIOND  Mask Type Full face mask  Dentures removed? Not applicable  Mask Size Medium  Respiratory Rate 22 breaths/min  IPAP 12 cmH20  EPAP 6 cmH2O  Flow Rate 4 lpm  Patient Home Machine No  Patient Home Mask No  Patient Home Tubing No  Auto Titrate No  Device Plugged into RED Power Outlet Yes  BiPAP/CPAP /SiPAP Vitals  Pulse Rate 80  Resp (!) 22 (patient had just returned from bathroom)  SpO2 96 %  Bilateral Breath Sounds Clear;Diminished  MEWS Score/Color  MEWS Score 1  MEWS Score Color Green

## 2023-08-16 NOTE — Plan of Care (Signed)
  Problem: Education: Goal: Knowledge of General Education information will improve Description: Including pain rating scale, medication(s)/side effects and non-pharmacologic comfort measures Outcome: Progressing   Problem: Clinical Measurements: Goal: Ability to maintain clinical measurements within normal limits will improve Outcome: Progressing Goal: Will remain free from infection Outcome: Progressing Goal: Diagnostic test results will improve Outcome: Progressing Goal: Respiratory complications will improve Outcome: Progressing Goal: Cardiovascular complication will be avoided Outcome: Progressing   Problem: Activity: Goal: Risk for activity intolerance will decrease Outcome: Progressing   Problem: Coping: Goal: Level of anxiety will decrease Outcome: Progressing   Problem: Pain Managment: Goal: General experience of comfort will improve and/or be controlled Outcome: Progressing   Problem: Skin Integrity: Goal: Risk for impaired skin integrity will decrease Outcome: Progressing   Problem: Fluid Volume: Goal: Hemodynamic stability will improve Outcome: Progressing   Problem: Respiratory: Goal: Ability to maintain adequate ventilation will improve Outcome: Progressing

## 2023-08-17 LAB — BASIC METABOLIC PANEL WITH GFR
Anion gap: 8 (ref 5–15)
BUN: 37 mg/dL — ABNORMAL HIGH (ref 8–23)
CO2: 26 mmol/L (ref 22–32)
Calcium: 9.5 mg/dL (ref 8.9–10.3)
Chloride: 105 mmol/L (ref 98–111)
Creatinine, Ser: 1.04 mg/dL — ABNORMAL HIGH (ref 0.44–1.00)
GFR, Estimated: 55 mL/min — ABNORMAL LOW (ref >60)
Glucose, Bld: 125 mg/dL — ABNORMAL HIGH (ref 70–99)
Potassium: 4.4 mmol/L (ref 3.5–5.1)
Sodium: 139 mmol/L (ref 135–145)

## 2023-08-17 MED ORDER — AZITHROMYCIN 250 MG PO TABS
500.0000 mg | ORAL_TABLET | Freq: Every day | ORAL | Status: AC
  Administered 2023-08-18 – 2023-08-20 (×3): 500 mg via ORAL
  Filled 2023-08-17 (×3): qty 2

## 2023-08-17 MED ORDER — METHYLPREDNISOLONE SODIUM SUCC 40 MG IJ SOLR
40.0000 mg | Freq: Every day | INTRAMUSCULAR | Status: DC
  Administered 2023-08-18 – 2023-08-19 (×2): 40 mg via INTRAVENOUS
  Filled 2023-08-17 (×2): qty 1

## 2023-08-17 NOTE — Plan of Care (Signed)

## 2023-08-17 NOTE — Progress Notes (Signed)
 PROGRESS NOTE   Diane Cohen  ZOX:[id number] DOB: 1946-02-11 DOA: 08/14/2023 PCP: Omie Bickers, MD   Chief Complaint  Patient presents with   Respiratory Distress   Level of care: Telemetry  Brief Admission History:  78 y.o. female with medical history significant of atrial fibrillation, hypothyroidism, heart failure, COPD, and hyperlipidemia, who presented with dyspnea.  Pt reported 4 days of progressive and worsening dyspnea, associated with cough, increase sputum production and wheezing. Severe weakness. She used her inhalers and nebulizer with no significant improvement in her symptoms. Positive fever but no chills.  Pt said she had recently been treated for bronchitis at urgent care with steroids and antibiotics. She initially felt better but now symptoms have returned.   In ED her 02 saturation was 84%, she had increased work of breathing and placed on bipap.  She is being admitted for pneumonia, COPD exacerbation and acute HFpEF.     Assessment and Plan:  Right upper lobe pneumonia - clinically improving with treatments Sepsis ruled out Elevated lactate thought secondary to hypoxia  Continue treatments Continue Ceftriaxone /Azithromycin . Continue IV solumedrol, reduced dose to daily   CKD stage 3a, GFR 45-59 ml/min Renal function stable, following Avoid hypotension and nephrotoxic medications   COPD with acute exacerbation  Acute hypoxemic and hypercapnic respiratory failure.  continue on Bipap nightly, has not required bipap during day in last 24 hours Continue scheduled bronchodilator therapy Inhaled and systemic corticosteroids  Start ambulating, PT eval requested  Acute on chronic diastolic CHF (congestive heart failure) 2024 echocardiogram with preserved LV systolic function with EF 60 to 65%, mild LVH, right ventricle with normal systolic function, RVSP 60.7 mmHg, with severe dilatation of left atrium and moderate dilation right atrium, no significant valvular disease.   Plan to continue metoprolol  and dapagliflozin   S/p lasix  IV on 6/7, resumed daily home oral torsemide  on 6/9 Follow up on repeat echocardiogram: Left ventricular ejection fraction, by estimation, is 60 to 65%. The  left ventricle has normal function. The left ventricle has no regional wall motion abnormalities. There is mild left ventricular hypertrophy. Left ventricular diastolic parameters were normal. Aortic valve sclerosis is present.   Filed Weights   08/14/23 0210 08/14/23 1330  Weight: 81 kg 79.4 kg    Intake/Output Summary (Last 24 hours) at 08/17/2023 1058 Last data filed at 08/17/2023 0500 Gross per 24 hour  Intake 240 ml  Output 1800 ml  Net -1560 ml   Paroxysmal atrial fibrillation Continue amiodarone , metoprolol  and diltiazem  for rate control Rhythm control with amiodarone .  Continue anticoagulation with rivaroxaban  for full dose anticoagulation  Hypothyroidism Continue levothyroxine    Essential hypertension Hypertensive Urgency - resolved  BPs much improved after resuming home metoprolol  and diltiazem .  As needed hydralazine    Leukocytosis - bump in WBC due to leukemoid reaction from IV steroid use but trending down now  DVT prophylaxis: rivaroxaban  Code Status: Full  Communication: pt updated at bedside, verbalized understanding Disposition: anticipate home when medically stabilized    Consultants:   Procedures:   Antimicrobials:  Ceftriaxone  6/7>> Azithromycin  6/7>>  Subjective: Pt reports ongoing SOB, fatigue, malaise, poor exercise tolerance even with trying to ambulate to bathroom  Objective: Vitals:   08/16/23 2241 08/17/23 0554 08/17/23 0804 08/17/23 0808  BP:  130/60    Pulse: 80 68    Resp: (!) 22 18    Temp:  97.9 F (36.6 C)    TempSrc:  Oral    SpO2: 96% 94% 93% 97%  Weight:  Height:        Intake/Output Summary (Last 24 hours) at 08/17/2023 1058 Last data filed at 08/17/2023 0500 Gross per 24 hour  Intake 240 ml  Output  1800 ml  Net -1560 ml   Filed Weights   08/14/23 0210 08/14/23 1330  Weight: 81 kg 79.4 kg   Examination:  General exam: Appears calm and comfortable  Respiratory system: much improved bibasilar crackles, on nasal cannula oxygen . No increased work of breathing.  Cardiovascular system: normal S1 & S2 heard. No JVD, murmurs, rubs, gallops or clicks. No pedal edema. Gastrointestinal system: Abdomen is nondistended, soft and nontender. No organomegaly or masses felt. Normal bowel sounds heard. Central nervous system: Alert and oriented. No focal neurological deficits. Extremities: 1+ pretibial edema BLEs. Symmetric 5 x 5 power. Skin: No rashes, lesions or ulcers. Psychiatry: Judgement and insight appear normal. Mood & affect appropriate.   Data Reviewed: I have personally reviewed following labs and imaging studies  CBC: Recent Labs  Lab 08/14/23 0207 08/14/23 0702 08/15/23 0324 08/15/23 1157 08/16/23 0326  WBC 14.6* 16.0* 20.6*  --  16.6*  NEUTROABS 8.9*  --  19.7*  --   --   HGB 15.1* 13.6 11.9* 13.4 11.9*  HCT 47.9* 42.8 36.8 41.3 37.2  MCV 104.8* 104.9* 103.7*  --  103.6*  PLT 336 215 208  --  214    Basic Metabolic Panel: Recent Labs  Lab 08/14/23 0207 08/14/23 0702 08/15/23 0324 08/17/23 0454  NA 137 143 141 139  K 3.8 3.8 4.2 4.4  CL 108 109 109 105  CO2 23 22 26 26   GLUCOSE 173* 182* 127* 125*  BUN 21 21 19  37*  CREATININE 1.11* 1.01* 0.79 1.04*  CALCIUM  10.1 9.9 9.8 9.5  MG  --   --  2.2  --     CBG: No results for input(s): "GLUCAP" in the last 168 hours.  Recent Results (from the past 240 hours)  Blood culture (routine x 2)     Status: None (Preliminary result)   Collection Time: 08/14/23  2:52 AM   Specimen: BLOOD RIGHT HAND  Result Value Ref Range Status   Specimen Description BLOOD RIGHT HAND  Final   Special Requests   Final    BOTTLES DRAWN AEROBIC AND ANAEROBIC Blood Culture adequate volume   Culture   Final    NO GROWTH 3 DAYS Performed  at [facility], [address], [place], [place] [address]    Report Status PENDING  Incomplete  Blood culture (routine x 2)     Status: None (Preliminary result)   Collection Time: 08/14/23  3:20 AM   Specimen: BLOOD RIGHT HAND  Result Value Ref Range Status   Specimen Description BLOOD RIGHT HAND  Final   Special Requests   Final    BOTTLES DRAWN AEROBIC ONLY Blood Culture adequate volume   Culture   Final    NO GROWTH 3 DAYS Performed at [facility], [address] [place], [place] [address]    Report Status PENDING  Incomplete  MRSA Next Gen by PCR, Nasal     Status: None   Collection Time: 08/16/23  9:44 AM   Specimen: Nasal Mucosa; Nasal Swab  Result Value Ref Range Status   MRSA by PCR Next Gen NOT DETECTED NOT DETECTED Final    Comment: (NOTE) The GeneXpert MRSA Assay (FDA approved for NASAL specimens only), is one component of a comprehensive MRSA colonization surveillance program. It is not intended to diagnose  MRSA infection nor to guide or monitor treatment for MRSA infections. Test performance is not FDA approved in patients less than 65 years old. Performed at [facility], [address] [place], [place] [address]      Radiology Studies: DG CHEST PORT 1 VIEW Result Date: 08/16/2023 CLINICAL DATA:  COPD EXAM: PORTABLE CHEST 1 VIEW COMPARISON:  Chest x-ray performed August 14, 2023 FINDINGS: Previously observed right upper lung zone airspace infiltrate is improved. Heart mediastinum are not significantly changed. Interstitial changes, improving. No significant pleural effusion or pneumothorax. IMPRESSION: 1. Improvement in right upper lung zone airspace infiltrate. Electronically Signed   By: Reagan Camera M.D.   On: 08/16/2023 08:24    Scheduled Meds:  amiodarone   200 mg Oral Daily   atorvastatin   20 mg Oral Daily   budesonide  (PULMICORT ) nebulizer solution  0.25 mg Nebulization BID   dapagliflozin  propanediol  10 mg Oral Daily   diltiazem   180 mg Oral Daily    DULoxetine   60 mg Oral Daily   hydrALAZINE   25 mg Oral Q8H   ipratropium-albuterol   3 mL Nebulization TID   levothyroxine   150 mcg Oral Q0600   methylPREDNISolone  (SOLU-MEDROL ) injection  40 mg Intravenous Q12H   metoprolol  succinate  25 mg Oral Daily   potassium chloride   40 mEq Oral Q supper   pregabalin   75 mg Oral QHS   Rivaroxaban   15 mg Oral Q supper   torsemide   20 mg Oral Daily   Continuous Infusions:  azithromycin  500 mg (08/16/23 0831)   cefTRIAXone  (ROCEPHIN )  IV 2 g (08/16/23 0709)    LOS: 3 days   Time spent: 50 mins   Robertta Halfhill Lincoln Renshaw, MD How to contact the [facility] Attending or Consulting provider 7A - 7P or covering provider during after hours 7P -7A, for this patient?  Check the care team in [facility] and look for a) attending/consulting TR[facility] provider listed and b) the TR[facility] team listed Log into [email or web address] to find provider on call.  Locate the [facility] provider you are looking for under [facility] and page to a number that you can be directly reached. If you still have difficulty reaching the provider, please page the [facility] (Director on Call) for the Hospitalists listed on amion for assistance.  08/17/2023, 10:58 AM

## 2023-08-17 NOTE — Progress Notes (Signed)
 Spoke with pt about wearing Bipap tonight. At this time pt states she would like to wear [place] only. I told her if she changes her mind to call Rt

## 2023-08-17 NOTE — Evaluation (Signed)
 Physical Therapy Evaluation Patient Details Name: SHAKEITA VANDEVANDER MRN: [id number] DOB: 12-29-1945 Today's Date: 08/17/2023  History of Present Illness  Ziah B Heikkila is a 78 y.o. female with medical history significant of atrial fibrillation, hypothyroidism, heart failure, COPD, and hyperlipidemia, who presented with dyspnea.    Reported 4 days of progressive and worsening dyspnea, associated with cough, increase sputum production and wheezing. Severe weakness. She used her inhalers and nebulizer with no significant improvement in her symptoms. Positive fever but no chills.      Today she had severe symptoms, prompting her to call EMS. She was found in respiratory distress. Patient received IV steroids, nebulized albuterol , placed on Cpap and transported to the ED.   She denies any PND, orthopnea or lower extremity edema. She ambulates with no assistance of walker or cane. Uses nocturnal supplemental 02 per [place].   Clinical Impression  Patient agreeable to PT evaluation. Limited most due to SOB/dyspnea on this date. Patient reports at baseline she is independent with mobility and ADLs, but may require some assist with heavier iADLs. She lives with her grandson and his family, providing close to 24/7 availability.  On this date, patient demonstrates good carryover of bed mobility, functional transfers, including toilet transfer, and ambulation at modified independent to independent levels. Very mild unsteadiness without RW and improvements with RW. Patient reporting she feels her gait velocity decreased due to SOB/dyspnea. Patient was received on 4 Lpm. During mobility, taken down to 2 Lpm, with SpO2 dropping to 90% at lowest. Left on 2 Lpm. Nursing notified. Patient does not present with urgent need for skilled physical therapy acutely at this time but would benefit from HHPT for endurance and strengthening once home. Patient discharged to care of nursing for ambulation daily as tolerated for length of stay.        If plan is discharge home, recommend the following:     Can travel by private vehicle        Equipment Recommendations None recommended by PT  Recommendations for Other Services       Functional Status Assessment Patient has had a recent decline in their functional status and demonstrates the ability to make significant improvements in function in a reasonable and predictable amount of time.     Precautions / Restrictions Precautions Precautions: Fall Recall of Precautions/Restrictions: Intact Restrictions Weight Bearing Restrictions Per Provider Order: No      Mobility  Bed Mobility Overal bed mobility: Modified Independent     General bed mobility comments: HOB flat, use of bed railings    Transfers Overall transfer level: Independent Equipment used: None     General transfer comment: From bed and toilet, no AD or rail use    Ambulation/Gait Ambulation/Gait assistance: Independent, Modified independent (Device/Increase time) Gait Distance (Feet): 100 Feet Assistive device: None, Rolling walker (2 wheels) Gait Pattern/deviations: Decreased step length - right, Decreased step length - left, Decreased stride length, Drifts right/left, Trunk flexed Gait velocity: Dec     General Gait Details: Pt with the above deviations and very mild unsteadiness during gait without RW, CGA/supervision required. improvements with RW. dec velocity throughout all. Pt demo SOB during.  Stairs    Wheelchair Mobility     Tilt Bed    Modified Rankin (Stroke Patients Only)       Balance Overall balance assessment: Mild deficits observed, not formally tested           Pertinent Vitals/Pain Pain Assessment Pain Assessment: No/denies pain  Home Living Family/patient expects to be discharged to:: Private residence Living Arrangements: Other relatives (Grandson and his family) Available Help at Discharge: Family;Available 24 hours/day Type of Home: House Home  Access: Stairs to enter Entrance Stairs-Rails: Right Entrance Stairs-Number of Steps: 4, wheel chair ramp available   Home Layout: One level Home Equipment: Grab bars - toilet;Grab bars - tub/shower;BSC/3in1;Rolling Walker (2 wheels);Cane - single point;Wheelchair - Lawyer Comments: Reports no change in living situation since last admission    Prior Function Prior Level of Function : Independent/Modified Independent             Mobility Comments: drives, community ambulator ADLs Comments: independent, occassional help for heavier chores     Extremity/Trunk Assessment   Upper Extremity Assessment Upper Extremity Assessment: Generalized weakness    Lower Extremity Assessment Lower Extremity Assessment: Generalized weakness (Pt demo generalized weakness throughout. But, adequate for completion of functional tasks)    Cervical / Trunk Assessment Cervical / Trunk Assessment: Kyphotic  Communication   Communication Communication: No apparent difficulties    Cognition Arousal: Alert Behavior During Therapy: WFL for tasks assessed/performed   PT - Cognitive impairments: No apparent impairments       Following commands: Intact       Cueing Cueing Techniques: Verbal cues     General Comments      Exercises     Assessment/Plan    PT Assessment All further PT needs can be met in the next venue of care  PT Problem List Decreased strength;Decreased balance;Decreased activity tolerance;Decreased mobility;Cardiopulmonary status limiting activity       PT Treatment Interventions      PT Goals (Current goals can be found in the Care Plan section)  Acute Rehab PT Goals Patient Stated Goal: Return home once medically stable with HHPT option PT Goal Formulation: With patient Time For Goal Achievement: 08/20/23 Potential to Achieve Goals: Good    Frequency       Co-evaluation               AM-PAC PT "6 Clicks" Mobility  Outcome  Measure Help needed turning from your back to your side while in a flat bed without using bedrails?: None Help needed moving from lying on your back to sitting on the side of a flat bed without using bedrails?: None Help needed moving to and from a bed to a chair (including a wheelchair)?: A Little Help needed standing up from a chair using your arms (e.g., wheelchair or bedside chair)?: None Help needed to walk in hospital room?: A Little Help needed climbing 3-5 steps with a railing? : A Little 6 Click Score: 21    End of Session   Activity Tolerance: Patient tolerated treatment well;Patient limited by fatigue Patient left: in bed;with call bell/phone within reach Nurse Communication: Mobility status PT Visit Diagnosis: Other abnormalities of gait and mobility (R26.89);Muscle weakness (generalized) (M62.81);Unsteadiness on feet (R26.81)    Time: 9562-1308 PT Time Calculation (min) (ACUTE ONLY): 22 min   Charges:   PT Evaluation $PT Eval Moderate Complexity: 1 Mod PT Treatments $Therapeutic Activity: 8-22 mins PT General Charges $$ ACUTE PT VISIT: 1 Visit       1:46 PM, 08/17/23 Afia Messenger Powell-Butler, PT, DPT [place] with [facility]

## 2023-08-18 DIAGNOSIS — J441 Chronic obstructive pulmonary disease with (acute) exacerbation: Secondary | ICD-10-CM | POA: Diagnosis not present

## 2023-08-18 DIAGNOSIS — J181 Lobar pneumonia, unspecified organism: Secondary | ICD-10-CM

## 2023-08-18 DIAGNOSIS — N1831 Chronic kidney disease, stage 3a: Secondary | ICD-10-CM | POA: Diagnosis not present

## 2023-08-18 DIAGNOSIS — I480 Paroxysmal atrial fibrillation: Secondary | ICD-10-CM | POA: Diagnosis not present

## 2023-08-18 DIAGNOSIS — J9621 Acute and chronic respiratory failure with hypoxia: Secondary | ICD-10-CM

## 2023-08-18 LAB — RESPIRATORY PANEL BY PCR

## 2023-08-18 LAB — BASIC METABOLIC PANEL WITH GFR
Anion gap: 9 (ref 5–15)
BUN: 42 mg/dL — ABNORMAL HIGH (ref 8–23)
CO2: 27 mmol/L (ref 22–32)
Calcium: 9.6 mg/dL (ref 8.9–10.3)
Chloride: 102 mmol/L (ref 98–111)
Creatinine, Ser: 1.11 mg/dL — ABNORMAL HIGH (ref 0.44–1.00)
GFR, Estimated: 51 mL/min — ABNORMAL LOW (ref >60)
Glucose, Bld: 110 mg/dL — ABNORMAL HIGH (ref 70–99)
Potassium: 4 mmol/L (ref 3.5–5.1)
Sodium: 138 mmol/L (ref 135–145)

## 2023-08-18 LAB — CBC
HCT: 38.3 % (ref 36.0–46.0)
Hemoglobin: 12.1 g/dL (ref 12.0–15.0)
MCH: 32.2 pg (ref 26.0–34.0)
MCHC: 31.6 g/dL (ref 30.0–36.0)
MCV: 101.9 fL — ABNORMAL HIGH (ref 80.0–100.0)
Platelets: 225 10*3/uL (ref 150–400)
RBC: 3.76 MIL/uL — ABNORMAL LOW (ref 3.87–5.11)
RDW: 15.6 % — ABNORMAL HIGH (ref 11.5–15.5)
WBC: 8.6 10*3/uL (ref 4.0–10.5)
nRBC: 0 % (ref 0.0–0.2)

## 2023-08-18 LAB — MAGNESIUM: Magnesium: 2.4 mg/dL (ref 1.7–2.4)

## 2023-08-18 MED ORDER — BUDESONIDE 0.5 MG/2ML IN SUSP
0.5000 mg | Freq: Two times a day (BID) | RESPIRATORY_TRACT | Status: DC
  Administered 2023-08-18 – 2023-08-20 (×4): 0.5 mg via RESPIRATORY_TRACT
  Filled 2023-08-18 (×4): qty 2

## 2023-08-18 MED ORDER — ARFORMOTEROL TARTRATE 15 MCG/2ML IN NEBU
15.0000 ug | INHALATION_SOLUTION | Freq: Two times a day (BID) | RESPIRATORY_TRACT | Status: DC
  Administered 2023-08-18 – 2023-08-20 (×4): 15 ug via RESPIRATORY_TRACT
  Filled 2023-08-18 (×4): qty 2

## 2023-08-18 MED ORDER — REVEFENACIN 175 MCG/3ML IN SOLN
175.0000 ug | Freq: Every day | RESPIRATORY_TRACT | Status: DC
  Administered 2023-08-19 – 2023-08-20 (×2): 175 ug via RESPIRATORY_TRACT
  Filled 2023-08-18 (×2): qty 3

## 2023-08-18 MED ORDER — HYDRALAZINE HCL 25 MG PO TABS
25.0000 mg | ORAL_TABLET | Freq: Two times a day (BID) | ORAL | Status: DC
  Administered 2023-08-19 – 2023-08-20 (×2): 25 mg via ORAL
  Filled 2023-08-18 (×3): qty 1

## 2023-08-18 MED ORDER — FUROSEMIDE 10 MG/ML IJ SOLN
20.0000 mg | Freq: Once | INTRAMUSCULAR | Status: AC
  Administered 2023-08-18: 20 mg via INTRAVENOUS
  Filled 2023-08-18: qty 2

## 2023-08-18 MED ORDER — ALBUTEROL SULFATE (2.5 MG/3ML) 0.083% IN NEBU
2.5000 mg | INHALATION_SOLUTION | Freq: Three times a day (TID) | RESPIRATORY_TRACT | Status: DC
  Administered 2023-08-19 (×3): 2.5 mg via RESPIRATORY_TRACT
  Filled 2023-08-18 (×3): qty 3

## 2023-08-18 NOTE — Progress Notes (Signed)
 Mobility Specialist Progress Note:    08/18/23 1501  Mobility  Activity Ambulated with assistance in hallway;Ambulated with assistance to bathroom  Level of Assistance Modified independent, requires aide device or extra time  Assistive Device None  Distance Ambulated (ft) 80 ft  Range of Motion/Exercises Active;All extremities  Activity Response Tolerated well  Mobility Referral Yes  Mobility visit 1 Mobility  Mobility Specialist Start Time (ACUTE ONLY) 1437  Mobility Specialist Stop Time (ACUTE ONLY) 1501  Mobility Specialist Time Calculation (min) (ACUTE ONLY) 24 min   Pt received in bed, agreeable to mobility. Modi to stand and ambulate with no AD. Tolerated well, see O2 sats below. Returned to room, all needs met.   SpO2 93% on 1L before ambulation SpO2 86% on 1L during ambulation SpO2 88% on 2L during ambulation SpO2 91% on 3L during ambulation   Glinda Lapping Mobility Specialist Please contact via SecureChat or  Rehab office at [phone]

## 2023-08-18 NOTE — TOC Initial Note (Signed)
 Transition of Care [facility]) - Initial/Assessment Note    Patient Details  Name: Diane Cohen MRN: [id number] Date of Birth: [birth year]  [facility] CM/SW Contact:    Juanda Noon Phone Number: 08/18/2023, 2:29 PM  Clinical Narrative:                  Writer spoke with patient regarding PT recommendation for HHPT. Patient agreeable to HHPT. Patient had no preferences , sent referral to [facility] with [facility] who was able to accept referral . Patient lives at home with daughter and grandson , patient states that she is independent and has DME equipment , but does not use them. Patient also states that she is able to drive still.  TOC to follow.   Expected Discharge Plan: Home w Home Health Services Barriers to Discharge: Continued Medical Work up   Patient Goals and CMS Choice Patient states their goals for this hospitalization and ongoing recovery are:: return back home CMS Medicare.gov Compare Post Acute Care list provided to:: Patient Choice offered to / list presented to : Patient      Expected Discharge Plan and Services In-house Referral: Clinical Social Work   Post Acute Care Choice: Durable Medical Equipment Living arrangements for the past 2 months: Single Family Home                           HH Arranged: PT HH Agency: [facility] Date [facility] Agency Contacted: 08/18/23 Time HH Agency Contacted: 1428 Representative spoke with at [facility] Agency: [facility]  Prior Living Arrangements/Services Living arrangements for the past 2 months: Single Family Home Lives with:: Adult Children, Relatives Patient language and need for interpreter reviewed:: Yes Do you feel safe going back to the place where you live?: Yes      Need for Family Participation in Patient Care: No (Comment) Care giver support system in place?: No (comment) Current home services: DME Criminal Activity/Legal Involvement Pertinent to Current Situation/Hospitalization: No -  Comment as needed  Activities of Daily Living   ADL Screening (condition at time of admission) Independently performs ADLs?: Yes (appropriate for developmental age) Is the patient deaf or have difficulty hearing?: No Does the patient have difficulty seeing, even when wearing glasses/contacts?: No Does the patient have difficulty concentrating, remembering, or making decisions?: No  Permission Sought/Granted      Share Information with NAME: Shronda     Permission granted to share info w Relationship: Patient     Emotional Assessment Appearance:: Appears stated age Attitude/Demeanor/Rapport: Engaged Affect (typically observed): Appropriate Orientation: : Oriented to Self, Oriented to Place, Oriented to  Time, Oriented to Situation Alcohol / Substance Use: Not Applicable Psych Involvement: No (comment)  Admission diagnosis:  Acute heart failure (HCC) [I50.9] Respiratory distress [R06.03] Acute on chronic respiratory failure with hypoxia (HCC) [J96.21] Patient Active Problem List   Diagnosis Date Noted   Acute heart failure (HCC) 08/14/2023   CKD stage 3a, GFR 45-59 ml/min (HCC) 08/14/2023   Right upper lobe pneumonia 08/14/2023   Obesity, class 1 08/14/2023   Acute renal failure superimposed on stage 3b chronic kidney disease (HCC) 06/11/2023   Acute renal failure with acute tubular necrosis superimposed on stage 3a chronic kidney disease (HCC) 06/11/2023   COPD with acute exacerbation (HCC) 06/09/2023   Multifocal pneumonia 06/05/2023   Pneumonia 06/04/2023   Loud snoring 08/19/2022   Acute bilateral low back pain without sciatica    Atrial fibrillation  with rapid ventricular response (HCC) 06/23/2019   History of colonic polyps 12/13/2018   Constipation 12/13/2018   Abdominal pain, epigastric 12/13/2018   History of hiatal hernia 05/12/2018   Encounter for screening colonoscopy 05/12/2018   Acute on chronic diastolic CHF (congestive heart failure) (HCC) 04/20/2018    Swelling of limb 11/09/2017   Chronic diastolic heart failure (HCC) 12/29/2013   Anemia 09/20/2013   Bilateral leg edema 08/29/2013   Essential hypertension    Hypothyroidism    Paroxysmal atrial fibrillation (HCC)    PCP:  Omie Bickers, MD Pharmacy:   [facility] [phone] - [place], [place] - [address] AT SEC OF [address] & [address] [place] [address] Phone: [phone] Fax: [phone]     Social Drivers of Health (SDOH) Social History: SDOH Screenings   Food Insecurity: No Food Insecurity (08/14/2023)  Housing: Low Risk  (08/16/2023)  Transportation Needs: No Transportation Needs (08/14/2023)  Utilities: Not At Risk (08/14/2023)  Depression (PHQ2-9): Low Risk  (05/02/2018)  Social Connections: Moderately Integrated (08/14/2023)  Tobacco Use: Medium Risk (08/14/2023)   SDOH Interventions:     Readmission Risk Interventions    08/18/2023    2:27 PM 08/17/2023   11:46 AM 06/07/2023   12:23 PM  Readmission Risk Prevention Plan  Medication Screening   Complete  Transportation Screening Complete Complete Complete  Home Care Screening Complete Complete   Medication Review (RN CM) Complete Complete

## 2023-08-18 NOTE — Progress Notes (Addendum)
 PROGRESS NOTE  KINSLIE HOVE HQI:[id number] DOB: 1945/05/29 DOA: 08/14/2023 PCP: Omie Bickers, MD  Brief History:  78 y.o. female with medical history significant of atrial fibrillation, hypothyroidism, heart failure, COPD, and hyperlipidemia, who presented with dyspnea.  Pt reported 4 days of progressive and worsening dyspnea, associated with cough, increase sputum production and wheezing. Severe weakness. She used her inhalers and nebulizer with no significant improvement in her symptoms. Positive fever but no chills.  Pt said she had recently been treated for bronchitis at urgent care with steroids and antibiotics. She initially felt better but now symptoms have returned.   In ED her 02 saturation was 84%, she had increased work of breathing and placed on bipap.  She is being admitted for pneumonia, COPD exacerbation and acute HFpEF.     Assessment/Plan: Acute on chronic respiratory failure with hypoxia -due to COPD exacerbation, pneumonia and CHF -On 2 L nasal cannula at nighttime only at home -initially requiring 3 L nasal cannula while awake -wean oxygen  for saturation >92%  Lobar pneumonia -Sepsis ruled out -Elevated lactate thought secondary to hypoxia  -Continue Ceftriaxone /Azithromycin .   CKD stage 3a, GFR 45-59 ml/min -baseline creatinine 0.9-1.2   COPD with acute exacerbation  -add brovana  -add Yulpelri -continue albuterol  nebs -continue solumedrol   Acute on chronic diastolic CHF (congestive heart failure) -08/14/23 Echo EF 60-65%, no wMA, normal RVF, trivial MR -continue metoprolol  and dapagliflozin   -S/p lasix  IV on 6/7, resumed daily home oral torsemide  on 6/9 -redose lasix  x 1  Paroxysmal atrial fibrillation -Continue amiodarone , metoprolol  and diltiazem  for rate control -continue Xarelto    Hypothyroidism Continue levothyroxine     Essential hypertension Hypertensive Urgency - resolved  -improved after resuming home metoprolol  and diltiazem .   -hydralazine  added      Family Communication: no  Family at bedside  Consultants:  none  Code Status:  FULL   DVT Prophylaxis:  Xarelto    Procedures: As Listed in Progress Note Above  Antibiotics: Ceftriaxone  6/7>> Azithro 6/7>>      Subjective: Pt states she is breathing better.  Denies cp, n/v/d, abd pain, fc  Objective: Vitals:   08/18/23 0800 08/18/23 0926 08/18/23 1312 08/18/23 1406  BP:   127/62   Pulse:   78   Resp:   18   Temp:   98.5 F (36.9 C)   TempSrc:   Oral   SpO2: 93%  93% 93%  Weight:  79.3 kg    Height:        Intake/Output Summary (Last 24 hours) at 08/18/2023 1642 Last data filed at 08/18/2023 1300 Gross per 24 hour  Intake 480 ml  Output 1600 ml  Net -1120 ml   Weight change:  Exam:  General:  Pt is alert, follows commands appropriately, not in acute distress HEENT: No icterus, No thrush, No neck mass, [place]/AT Cardiovascular: RRR, S1/S2, no rubs, no gallops Respiratory: bilateral rales.  No wheeze Abdomen: Soft/+BS, non tender, non distended, no guarding Extremities: trace LE edema, No lymphangitis, No petechiae, No rashes, no synovitis   Data Reviewed: I have personally reviewed following labs and imaging studies Basic Metabolic Panel: Recent Labs  Lab 08/14/23 0207 08/14/23 0702 08/15/23 0324 08/17/23 0454 08/18/23 0415  NA 137 143 141 139 138  K 3.8 3.8 4.2 4.4 4.0  CL 108 109 109 105 102  CO2 23 22 26 26 27   GLUCOSE 173* 182* 127* 125* 110*  BUN 21 21 19  37*  42*  CREATININE 1.11* 1.01* 0.79 1.04* 1.11*  CALCIUM  10.1 9.9 9.8 9.5 9.6  MG  --   --  2.2  --  2.4   Liver Function Tests: Recent Labs  Lab 08/14/23 0207  AST 34  ALT 26  ALKPHOS 114  BILITOT 0.9  PROT 7.6  ALBUMIN 3.9   No results for input(s): LIPASE, AMYLASE in the last 168 hours. No results for input(s): AMMONIA in the last 168 hours. Coagulation Profile: No results for input(s): INR, PROTIME in the last 168 hours. CBC: Recent  Labs  Lab 08/14/23 0207 08/14/23 0702 08/15/23 0324 08/15/23 1157 08/16/23 0326 08/18/23 0415  WBC 14.6* 16.0* 20.6*  --  16.6* 8.6  NEUTROABS 8.9*  --  19.7*  --   --   --   HGB 15.1* 13.6 11.9* 13.4 11.9* 12.1  HCT 47.9* 42.8 36.8 41.3 37.2 38.3  MCV 104.8* 104.9* 103.7*  --  103.6* 101.9*  PLT 336 215 208  --  214 225   Cardiac Enzymes: No results for input(s): CKTOTAL, CKMB, CKMBINDEX, TROPONINI in the last 168 hours. BNP: Invalid input(s): POCBNP CBG: No results for input(s): GLUCAP in the last 168 hours. HbA1C: No results for input(s): HGBA1C in the last 72 hours. Urine analysis:    Component Value Date/Time   COLORURINE YELLOW 12/28/2013 1706   APPEARANCEUR CLEAR 12/28/2013 1706   LABSPEC <1.005 (L) 12/28/2013 1706   PHURINE 5.5 12/28/2013 1706   GLUCOSEU NEGATIVE 12/28/2013 1706   HGBUR NEGATIVE 12/28/2013 1706   BILIRUBINUR NEGATIVE 12/28/2013 1706   KETONESUR NEGATIVE 12/28/2013 1706   PROTEINUR NEGATIVE 12/28/2013 1706   UROBILINOGEN 0.2 12/28/2013 1706   NITRITE NEGATIVE 12/28/2013 1706   LEUKOCYTESUR NEGATIVE 12/28/2013 1706   Sepsis Labs: @LABRCNTIP (procalcitonin:4,lacticidven:4) ) Recent Results (from the past 240 hours)  Blood culture (routine x 2)     Status: None (Preliminary result)   Collection Time: 08/14/23  2:52 AM   Specimen: BLOOD RIGHT HAND  Result Value Ref Range Status   Specimen Description BLOOD RIGHT HAND  Final   Special Requests   Final    BOTTLES DRAWN AEROBIC AND ANAEROBIC Blood Culture adequate volume   Culture   Final    NO GROWTH 4 DAYS Performed at [facility], [address], [place], [place] [address]    Report Status PENDING  Incomplete  Blood culture (routine x 2)     Status: None (Preliminary result)   Collection Time: 08/14/23  3:20 AM   Specimen: BLOOD RIGHT HAND  Result Value Ref Range Status   Specimen Description BLOOD RIGHT HAND  Final   Special Requests   Final    BOTTLES DRAWN AEROBIC ONLY  Blood Culture adequate volume   Culture   Final    NO GROWTH 4 DAYS Performed at [facility], [address], [place], [place] [address]    Report Status PENDING  Incomplete  MRSA Next Gen by PCR, Nasal     Status: None   Collection Time: 08/16/23  9:44 AM   Specimen: Nasal Mucosa; Nasal Swab  Result Value Ref Range Status   MRSA by PCR Next Gen NOT DETECTED NOT DETECTED Final    Comment: (NOTE) The GeneXpert MRSA Assay (FDA approved for NASAL specimens only), is one component of a comprehensive MRSA colonization surveillance program. It is not intended to diagnose MRSA infection nor to guide or monitor treatment for MRSA infections. Test performance is not FDA approved in patients less than 17 years old. Performed  at [facility], [address] [place], [place] [address]      Scheduled Meds:  [START ON 08/19/2023] albuterol   2.5 mg Nebulization Q8H   amiodarone   200 mg Oral Daily   arformoterol   15 mcg Nebulization BID   atorvastatin   20 mg Oral Daily   azithromycin   500 mg Oral Daily   budesonide  (PULMICORT ) nebulizer solution  0.5 mg Nebulization BID   dapagliflozin  propanediol  10 mg Oral Daily   diltiazem   180 mg Oral Daily   DULoxetine   60 mg Oral Daily   hydrALAZINE   25 mg Oral Q8H   ipratropium-albuterol   3 mL Nebulization TID   levothyroxine   150 mcg Oral Q0600   methylPREDNISolone  (SOLU-MEDROL ) injection  40 mg Intravenous Daily   metoprolol  succinate  25 mg Oral Daily   potassium chloride   40 mEq Oral Q supper   pregabalin   75 mg Oral QHS   [START ON 08/19/2023] revefenacin   175 mcg Nebulization Daily   Rivaroxaban   15 mg Oral Q supper   torsemide   20 mg Oral Daily   Continuous Infusions:  cefTRIAXone  (ROCEPHIN )  IV 2 g (08/18/23 0926)    Procedures/Studies: DG CHEST PORT 1 VIEW Result Date: 08/16/2023 CLINICAL DATA:  COPD EXAM: PORTABLE CHEST 1 VIEW COMPARISON:  Chest x-ray performed August 14, 2023 FINDINGS: Previously observed right upper lung zone airspace  infiltrate is improved. Heart mediastinum are not significantly changed. Interstitial changes, improving. No significant pleural effusion or pneumothorax. IMPRESSION: 1. Improvement in right upper lung zone airspace infiltrate. Electronically Signed   By: Reagan Camera M.D.   On: 08/16/2023 08:24   ECHOCARDIOGRAM COMPLETE Result Date: 08/14/2023    ECHOCARDIOGRAM REPORT   Patient Name:   Diane Cohen Date of Exam: 08/14/2023 Medical Rec #:  [id number]     Height:       61.0 in Accession #:    [id number]    Weight:       178.6 lb Date of Birth:  1946-03-01     BSA:          1.800 m Patient Age:    77 years      BP:           131/59 mmHg Patient Gender: F             HR:           87 bpm. Exam Location:  [facility] Procedure: 2D Echo, Cardiac Doppler and Color Doppler (Both Spectral and Color            Flow Doppler were utilized during procedure). Indications:    Dyspnea R06.00  History:        Patient has prior history of Echocardiogram examinations. CHF,                 COPD, Arrythmias:Atrial Fibrillation; Risk Factors:Hypertension.  Sonographer:    Denese Finn RCS Referring Phys: [id number] MAURICIO DANIEL ARRIEN IMPRESSIONS  1. Left ventricular ejection fraction, by estimation, is 60 to 65%. The left ventricle has normal function. The left ventricle has no regional wall motion abnormalities. There is mild left ventricular hypertrophy. Left ventricular diastolic parameters were normal.  2. Right ventricular systolic function is normal. The right ventricular size is normal.  3. Left atrial size was mildly dilated.  4. The mitral valve is abnormal. Trivial mitral valve regurgitation. No evidence of mitral stenosis.  5. Mean gradient 5 peak 10 mmhg but AVA 2.2 cm2. The aortic valve is tricuspid. There  is moderate calcification of the aortic valve. There is moderate thickening of the aortic valve. Aortic valve regurgitation is not visualized. Aortic valve sclerosis is present, with no evidence of aortic valve stenosis.   6. The inferior vena cava is normal in size with greater than 50% respiratory variability, suggesting right atrial pressure of 3 mmHg.  7. Cannot exclude a small PFO. FINDINGS  Left Ventricle: Left ventricular ejection fraction, by estimation, is 60 to 65%. The left ventricle has normal function. The left ventricle has no regional wall motion abnormalities. Strain was performed and the global longitudinal strain is indeterminate. The left ventricular internal cavity size was normal in size. There is mild left ventricular hypertrophy. Left ventricular diastolic parameters were normal. Right Ventricle: The right ventricular size is normal. No increase in right ventricular wall thickness. Right ventricular systolic function is normal. Left Atrium: Left atrial size was mildly dilated. Right Atrium: Right atrial size was normal in size. Pericardium: There is no evidence of pericardial effusion. Mitral Valve: The mitral valve is abnormal. There is mild thickening of the mitral valve leaflet(s). There is mild calcification of the mitral valve leaflet(s). Trivial mitral valve regurgitation. No evidence of mitral valve stenosis. Tricuspid Valve: The tricuspid valve is normal in structure. Tricuspid valve regurgitation is mild . No evidence of tricuspid stenosis. Aortic Valve: Mean gradient 5 peak 10 mmhg but AVA 2.2 cm2. The aortic valve is tricuspid. There is moderate calcification of the aortic valve. There is moderate thickening of the aortic valve. Aortic valve regurgitation is not visualized. Aortic valve sclerosis is present, with no evidence of aortic valve stenosis. Aortic valve mean gradient measures 5.0 mmHg. Aortic valve peak gradient measures 9.5 mmHg. Aortic valve area, by VTI measures 2.25 cm. Pulmonic Valve: The pulmonic valve was normal in structure. Pulmonic valve regurgitation is not visualized. No evidence of pulmonic stenosis. Aorta: The aortic root is normal in size and structure. Venous: The inferior  vena cava is normal in size with greater than 50% respiratory variability, suggesting right atrial pressure of 3 mmHg. IAS/Shunts: Cannot exclude a small PFO. Additional Comments: 3D was performed not requiring image post processing on an independent workstation and was indeterminate.  LEFT VENTRICLE PLAX 2D LVIDd:         4.10 cm   Diastology LVIDs:         2.70 cm   LV e' medial:    6.96 cm/s LV PW:         1.00 cm   LV E/e' medial:  10.8 LV IVS:        1.20 cm   LV e' lateral:   11.70 cm/s LVOT diam:     1.80 cm   LV E/e' lateral: 6.4 LV SV:         62 LV SV Index:   34 LVOT Area:     2.54 cm  RIGHT VENTRICLE RV S prime:     19.70 cm/s TAPSE (M-mode): 2.6 cm LEFT ATRIUM             Index        RIGHT ATRIUM           Index LA diam:        4.00 cm 2.22 cm/m   RA Area:     13.30 cm LA Vol (A2C):   50.0 ml 27.78 ml/m  RA Volume:   30.10 ml  16.72 ml/m LA Vol (A4C):   69.2 ml 38.45 ml/m LA Biplane Vol: 59.1 ml  32.83 ml/m  AORTIC VALVE AV Area (Vmax):    2.07 cm AV Area (Vmean):   2.24 cm AV Area (VTI):     2.25 cm AV Vmax:           154.00 cm/s AV Vmean:          107.000 cm/s AV VTI:            0.276 m AV Peak Grad:      9.5 mmHg AV Mean Grad:      5.0 mmHg LVOT Vmax:         125.50 cm/s LVOT Vmean:        94.200 cm/s LVOT VTI:          0.244 m LVOT/AV VTI ratio: 0.88  AORTA Ao Root diam: 3.10 cm MITRAL VALVE MV Area (PHT): 3.48 cm    SHUNTS MV Decel Time: 218 msec    Systemic VTI:  0.24 m MV E velocity: 75.20 cm/s  Systemic Diam: 1.80 cm MV A velocity: 58.50 cm/s MV E/A ratio:  1.29 Janelle Mediate MD Electronically signed by Janelle Mediate MD Signature Date/Time: 08/14/2023/1:10:05 PM    Final    DG Chest Portable 1 View Result Date: 08/14/2023 CLINICAL DATA:  Respiratory distress EXAM: PORTABLE CHEST 1 VIEW COMPARISON:  06/09/2023, 07/16/2023 FINDINGS: Cardiac shadow is within normal limits. Near complete consolidation of the right upper lobe is noted. Left perihilar opacity is noted as well. IMPRESSION:  Increasing right upper lobe consolidation consistent with focal infiltrate. Left perihilar opacity and left retrocardiac opacity. Electronically Signed   By: Violeta Grey M.D.   On: 08/14/2023 02:34    Demaris Fillers, DO  [facility]  If 7PM-7AM, please contact night-coverage [email or web address] Password [facility] 08/18/2023, 4:42 PM   LOS: 4 days

## 2023-08-18 NOTE — Plan of Care (Signed)
  Problem: Clinical Measurements: Goal: Ability to maintain clinical measurements within normal limits will improve Outcome: Progressing Goal: Will remain free from infection Outcome: Progressing Goal: Diagnostic test results will improve Outcome: Progressing Goal: Respiratory complications will improve Outcome: Progressing Goal: Cardiovascular complication will be avoided Outcome: Progressing   Problem: Coping: Goal: Level of anxiety will decrease Outcome: Progressing   Problem: Elimination: Goal: Will not experience complications related to bowel motility Outcome: Progressing Goal: Will not experience complications related to urinary retention Outcome: Progressing   Problem: Safety: Goal: Ability to remain free from injury will improve Outcome: Progressing   Problem: Fluid Volume: Goal: Hemodynamic stability will improve Outcome: Progressing   Problem: Clinical Measurements: Goal: Diagnostic test results will improve Outcome: Progressing Goal: Signs and symptoms of infection will decrease Outcome: Progressing   Problem: Respiratory: Goal: Ability to maintain adequate ventilation will improve Outcome: Progressing

## 2023-08-19 DIAGNOSIS — J9621 Acute and chronic respiratory failure with hypoxia: Secondary | ICD-10-CM | POA: Diagnosis not present

## 2023-08-19 DIAGNOSIS — J181 Lobar pneumonia, unspecified organism: Secondary | ICD-10-CM | POA: Diagnosis not present

## 2023-08-19 DIAGNOSIS — J441 Chronic obstructive pulmonary disease with (acute) exacerbation: Secondary | ICD-10-CM | POA: Diagnosis not present

## 2023-08-19 DIAGNOSIS — I5033 Acute on chronic diastolic (congestive) heart failure: Secondary | ICD-10-CM | POA: Diagnosis not present

## 2023-08-19 LAB — MAGNESIUM: Magnesium: 2.3 mg/dL (ref 1.7–2.4)

## 2023-08-19 LAB — BASIC METABOLIC PANEL WITH GFR
Anion gap: 8 (ref 5–15)
BUN: 36 mg/dL — ABNORMAL HIGH (ref 8–23)
CO2: 30 mmol/L (ref 22–32)
Calcium: 9.2 mg/dL (ref 8.9–10.3)
Chloride: 101 mmol/L (ref 98–111)
Creatinine, Ser: 1.04 mg/dL — ABNORMAL HIGH (ref 0.44–1.00)
GFR, Estimated: 55 mL/min — ABNORMAL LOW (ref >60)
Glucose, Bld: 107 mg/dL — ABNORMAL HIGH (ref 70–99)
Potassium: 4.1 mmol/L (ref 3.5–5.1)
Sodium: 139 mmol/L (ref 135–145)

## 2023-08-19 LAB — CULTURE, BLOOD (ROUTINE X 2)
Culture: NO GROWTH
Culture: NO GROWTH
Special Requests: ADEQUATE
Special Requests: ADEQUATE

## 2023-08-19 LAB — PROCALCITONIN: Procalcitonin: <0.1 ng/mL

## 2023-08-19 LAB — TROPONIN I (HIGH SENSITIVITY): Troponin I (High Sensitivity): 43 ng/L — ABNORMAL HIGH (ref <18)

## 2023-08-19 LAB — BRAIN NATRIURETIC PEPTIDE: B Natriuretic Peptide: 114 pg/mL — ABNORMAL HIGH (ref 0.0–100.0)

## 2023-08-19 MED ORDER — ALBUTEROL SULFATE (2.5 MG/3ML) 0.083% IN NEBU
2.5000 mg | INHALATION_SOLUTION | Freq: Three times a day (TID) | RESPIRATORY_TRACT | Status: DC
  Administered 2023-08-20: 2.5 mg via RESPIRATORY_TRACT
  Filled 2023-08-19: qty 3

## 2023-08-19 MED ORDER — FUROSEMIDE 10 MG/ML IJ SOLN
40.0000 mg | Freq: Once | INTRAMUSCULAR | Status: AC
  Administered 2023-08-19: 40 mg via INTRAVENOUS
  Filled 2023-08-19: qty 4

## 2023-08-19 MED ORDER — PREDNISONE 20 MG PO TABS
50.0000 mg | ORAL_TABLET | Freq: Every day | ORAL | Status: DC
  Administered 2023-08-20: 50 mg via ORAL
  Filled 2023-08-19: qty 1

## 2023-08-19 NOTE — Plan of Care (Signed)
  Problem: Education: Goal: Knowledge of General Education information will improve Description: Including pain rating scale, medication(s)/side effects and non-pharmacologic comfort measures Outcome: Progressing   Problem: Health Behavior/Discharge Planning: Goal: Ability to manage health-related needs will improve Outcome: Progressing   Problem: Pain Managment: Goal: General experience of comfort will improve and/or be controlled Outcome: Progressing   Problem: Safety: Goal: Ability to remain free from injury will improve Outcome: Progressing

## 2023-08-19 NOTE — Plan of Care (Signed)

## 2023-08-19 NOTE — Progress Notes (Signed)
 Mobility Specialist Progress Note:    08/19/23 1507  Mobility  Activity Ambulated with assistance in hallway;Transferred from chair to bed  Level of Assistance Modified independent, requires aide device or extra time  Assistive Device None  Distance Ambulated (ft) 100 ft  Range of Motion/Exercises Active;All extremities  Activity Response Tolerated well  Mobility Referral Yes  Mobility visit 1 Mobility  Mobility Specialist Start Time (ACUTE ONLY) 1436  Mobility Specialist Stop Time (ACUTE ONLY) 1500  Mobility Specialist Time Calculation (min) (ACUTE ONLY) 24 min   Pt received in chair, agreeable to mobility. ModI to stand and ambulate wtith no AD. Tolerated well, SpO2 88-92% on 1L throughout. Returned to room, RRT at bedside. All needs met.    Glinda Lapping Mobility Specialist Please contact via Special educational needs teacher or  Rehab office at [phone]

## 2023-08-19 NOTE — Plan of Care (Signed)

## 2023-08-19 NOTE — Progress Notes (Signed)
 PROGRESS NOTE  Diane Cohen HQI:[id number] DOB: 02-Jan-1946 DOA: 08/14/2023 PCP: Omie Bickers, MD  Brief History:  78 y.o. female with medical history significant of atrial fibrillation, hypothyroidism, heart failure, COPD, and hyperlipidemia, who presented with dyspnea.  Pt reported 4 days of progressive and worsening dyspnea, associated with cough, increase sputum production and wheezing. Severe weakness. She used her inhalers and nebulizer with no significant improvement in her symptoms. Positive fever but no chills.  Pt said she had recently been treated for bronchitis at urgent care with steroids and antibiotics. She initially felt better but now symptoms have returned.   In ED her 02 saturation was 84%, she had increased work of breathing and placed on bipap.  She is being admitted for pneumonia, COPD exacerbation and acute HFpEF.     Assessment/Plan: Acute on chronic respiratory failure with hypoxia -due to COPD exacerbation, pneumonia and CHF -On 2 L nasal cannula at nighttime only at home -initially requiring 3 L nasal cannula while awake -wean oxygen  for saturation >92%   Lobar pneumonia -Sepsis ruled out -Elevated lactate thought secondary to hypoxia  -Continue Ceftriaxone /Azithromycin .   CKD stage 3a, GFR 45-59 ml/min -baseline creatinine 0.9-1.2   COPD with acute exacerbation  -add brovana  -add Yulpelri -continue albuterol  nebs -continue solumedrol   Acute on chronic diastolic CHF (congestive heart failure) -08/14/23 Echo EF 60-65%, no wMA, normal RVF, trivial MR -continue metoprolol  and dapagliflozin   -S/p lasix  IV on 6/7, resumed daily home oral torsemide  on 6/9 -redose lasix  x 1 on 6/11 and 6/12   Paroxysmal atrial fibrillation -Continue amiodarone , metoprolol  and diltiazem  for rate control -continue Xarelto    Hypothyroidism Continue levothyroxine     Essential hypertension Hypertensive Urgency - resolved  -improved after resuming home metoprolol  and  diltiazem .  -hydralazine  added          Family Communication: no  Family at bedside   Consultants:  none   Code Status:  FULL    DVT Prophylaxis:  Xarelto      Procedures: As Listed in Progress Note Above   Antibiotics: Ceftriaxone  6/7>> Azithro 6/7>>         Subjective: Overall breathing better.  Denies f/c, cp, n/v/d.  Has dry cough  Objective: Vitals:   08/18/23 2006 08/19/23 0430 08/19/23 0722 08/19/23 1341  BP:  (!) 150/70  128/65  Pulse:  72  77  Resp:  17  16  Temp:  98.3 F (36.8 C)  98.6 F (37 C)  TempSrc:  Oral  Oral  SpO2: 97% 93% 95% 97%  Weight:      Height:        Intake/Output Summary (Last 24 hours) at 08/19/2023 1743 Last data filed at 08/19/2023 0500 Gross per 24 hour  Intake 480 ml  Output --  Net 480 ml   Weight change:  Exam:  General:  Pt is alert, follows commands appropriately, not in acute distress HEENT: No icterus, No thrush, No neck mass, [place]/AT Cardiovascular: RRR, S1/S2, no rubs, no gallops Respiratory: bibasilar rales.  No wheeze Abdomen: Soft/+BS, non tender, non distended, no guarding Extremities: 1 + LE edema, No lymphangitis, No petechiae, No rashes, no synovitis   Data Reviewed: I have personally reviewed following labs and imaging studies Basic Metabolic Panel: Recent Labs  Lab 08/14/23 0702 08/15/23 0324 08/17/23 0454 08/18/23 0415 08/19/23 0412  NA 143 141 139 138 139  K 3.8 4.2 4.4 4.0 4.1  CL 109 109 105 102  101  CO2 22 26 26 27 30   GLUCOSE 182* 127* 125* 110* 107*  BUN 21 19 37* 42* 36*  CREATININE 1.01* 0.79 1.04* 1.11* 1.04*  CALCIUM  9.9 9.8 9.5 9.6 9.2  MG  --  2.2  --  2.4 2.3   Liver Function Tests: Recent Labs  Lab 08/14/23 0207  AST 34  ALT 26  ALKPHOS 114  BILITOT 0.9  PROT 7.6  ALBUMIN 3.9   No results for input(s): LIPASE, AMYLASE in the last 168 hours. No results for input(s): AMMONIA in the last 168 hours. Coagulation Profile: No results for input(s): INR,  PROTIME in the last 168 hours. CBC: Recent Labs  Lab 08/14/23 0207 08/14/23 0702 08/15/23 0324 08/15/23 1157 08/16/23 0326 08/18/23 0415  WBC 14.6* 16.0* 20.6*  --  16.6* 8.6  NEUTROABS 8.9*  --  19.7*  --   --   --   HGB 15.1* 13.6 11.9* 13.4 11.9* 12.1  HCT 47.9* 42.8 36.8 41.3 37.2 38.3  MCV 104.8* 104.9* 103.7*  --  103.6* 101.9*  PLT 336 215 208  --  214 225   Cardiac Enzymes: No results for input(s): CKTOTAL, CKMB, CKMBINDEX, TROPONINI in the last 168 hours. BNP: Invalid input(s): POCBNP CBG: No results for input(s): GLUCAP in the last 168 hours. HbA1C: No results for input(s): HGBA1C in the last 72 hours. Urine analysis:    Component Value Date/Time   COLORURINE YELLOW 12/28/2013 1706   APPEARANCEUR CLEAR 12/28/2013 1706   LABSPEC <1.005 (L) 12/28/2013 1706   PHURINE 5.5 12/28/2013 1706   GLUCOSEU NEGATIVE 12/28/2013 1706   HGBUR NEGATIVE 12/28/2013 1706   BILIRUBINUR NEGATIVE 12/28/2013 1706   KETONESUR NEGATIVE 12/28/2013 1706   PROTEINUR NEGATIVE 12/28/2013 1706   UROBILINOGEN 0.2 12/28/2013 1706   NITRITE NEGATIVE 12/28/2013 1706   LEUKOCYTESUR NEGATIVE 12/28/2013 1706   Sepsis Labs: @LABRCNTIP (procalcitonin:4,lacticidven:4) ) Recent Results (from the past 240 hours)  Blood culture (routine x 2)     Status: None   Collection Time: 08/14/23  2:52 AM   Specimen: BLOOD RIGHT HAND  Result Value Ref Range Status   Specimen Description BLOOD RIGHT HAND  Final   Special Requests   Final    BOTTLES DRAWN AEROBIC AND ANAEROBIC Blood Culture adequate volume   Culture   Final    NO GROWTH 5 DAYS Performed at [facility], [address], [place], [place] [address]    Report Status 08/19/2023 FINAL  Final  Blood culture (routine x 2)     Status: None   Collection Time: 08/14/23  3:20 AM   Specimen: BLOOD RIGHT HAND  Result Value Ref Range Status   Specimen Description BLOOD RIGHT HAND  Final   Special Requests   Final    BOTTLES DRAWN  AEROBIC ONLY Blood Culture adequate volume   Culture   Final    NO GROWTH 5 DAYS Performed at [facility], [address], [place], [place] [address]    Report Status 08/19/2023 FINAL  Final  MRSA Next Gen by PCR, Nasal     Status: None   Collection Time: 08/16/23  9:44 AM   Specimen: Nasal Mucosa; Nasal Swab  Result Value Ref Range Status   MRSA by PCR Next Gen NOT DETECTED NOT DETECTED Final    Comment: (NOTE) The GeneXpert MRSA Assay (FDA approved for NASAL specimens only), is one component of a comprehensive MRSA colonization surveillance program. It is not intended to diagnose MRSA infection nor to guide or monitor treatment  for MRSA infections. Test performance is not FDA approved in patients less than 49 years old. Performed at [facility], [address], [place], [place] [address]   Respiratory (~20 pathogens) panel by PCR     Status: None   Collection Time: 08/18/23  5:45 PM   Specimen: Nasopharyngeal Swab; Respiratory  Result Value Ref Range Status   Adenovirus NOT DETECTED NOT DETECTED Final   Coronavirus 229E NOT DETECTED NOT DETECTED Final    Comment: (NOTE) The Coronavirus on the Respiratory Panel, DOES NOT test for the novel  Coronavirus (2019 nCoV)    Coronavirus HKU1 NOT DETECTED NOT DETECTED Final   Coronavirus NL63 NOT DETECTED NOT DETECTED Final   Coronavirus OC43 NOT DETECTED NOT DETECTED Final   Metapneumovirus NOT DETECTED NOT DETECTED Final   Rhinovirus / Enterovirus NOT DETECTED NOT DETECTED Final   Influenza A NOT DETECTED NOT DETECTED Final   Influenza B NOT DETECTED NOT DETECTED Final   Parainfluenza Virus 1 NOT DETECTED NOT DETECTED Final   Parainfluenza Virus 2 NOT DETECTED NOT DETECTED Final   Parainfluenza Virus 3 NOT DETECTED NOT DETECTED Final   Parainfluenza Virus 4 NOT DETECTED NOT DETECTED Final   Respiratory Syncytial Virus NOT DETECTED NOT DETECTED Final   Bordetella pertussis NOT DETECTED NOT DETECTED Final   Bordetella Parapertussis  NOT DETECTED NOT DETECTED Final   Chlamydophila pneumoniae NOT DETECTED NOT DETECTED Final   Mycoplasma pneumoniae NOT DETECTED NOT DETECTED Final    Comment: Performed at [facility], [address] [place], [place] [address]     Scheduled Meds:  albuterol   2.5 mg Nebulization Q8H   amiodarone   200 mg Oral Daily   arformoterol   15 mcg Nebulization BID   atorvastatin   20 mg Oral Daily   azithromycin   500 mg Oral Daily   budesonide  (PULMICORT ) nebulizer solution  0.5 mg Nebulization BID   dapagliflozin  propanediol  10 mg Oral Daily   diltiazem   180 mg Oral Daily   DULoxetine   60 mg Oral Daily   hydrALAZINE   25 mg Oral BID   levothyroxine   150 mcg Oral Q0600   methylPREDNISolone  (SOLU-MEDROL ) injection  40 mg Intravenous Daily   metoprolol  succinate  25 mg Oral Daily   potassium chloride   40 mEq Oral Q supper   pregabalin   75 mg Oral QHS   revefenacin   175 mcg Nebulization Daily   Rivaroxaban   15 mg Oral Q supper   Continuous Infusions:  cefTRIAXone  (ROCEPHIN )  IV 2 g (08/19/23 0806)    Procedures/Studies: DG CHEST PORT 1 VIEW Result Date: 08/16/2023 CLINICAL DATA:  COPD EXAM: PORTABLE CHEST 1 VIEW COMPARISON:  Chest x-ray performed August 14, 2023 FINDINGS: Previously observed right upper lung zone airspace infiltrate is improved. Heart mediastinum are not significantly changed. Interstitial changes, improving. No significant pleural effusion or pneumothorax. IMPRESSION: 1. Improvement in right upper lung zone airspace infiltrate. Electronically Signed   By: Reagan Camera M.D.   On: 08/16/2023 08:24   ECHOCARDIOGRAM COMPLETE Result Date: 08/14/2023    ECHOCARDIOGRAM REPORT   Patient Name:   Diane Cohen Date of Exam: 08/14/2023 Medical Rec #:  [id number]     Height:       61.0 in Accession #:    [id number]    Weight:       178.6 lb Date of Birth:  04/27/1945     BSA:          1.800 m Patient Age:    47 years  BP:           131/59 mmHg Patient Gender: F             HR:            87 bpm. Exam Location:  [facility] Procedure: 2D Echo, Cardiac Doppler and Color Doppler (Both Spectral and Color            Flow Doppler were utilized during procedure). Indications:    Dyspnea R06.00  History:        Patient has prior history of Echocardiogram examinations. CHF,                 COPD, Arrythmias:Atrial Fibrillation; Risk Factors:Hypertension.  Sonographer:    Denese Finn RCS Referring Phys: [id number] MAURICIO DANIEL ARRIEN IMPRESSIONS  1. Left ventricular ejection fraction, by estimation, is 60 to 65%. The left ventricle has normal function. The left ventricle has no regional wall motion abnormalities. There is mild left ventricular hypertrophy. Left ventricular diastolic parameters were normal.  2. Right ventricular systolic function is normal. The right ventricular size is normal.  3. Left atrial size was mildly dilated.  4. The mitral valve is abnormal. Trivial mitral valve regurgitation. No evidence of mitral stenosis.  5. Mean gradient 5 peak 10 mmhg but AVA 2.2 cm2. The aortic valve is tricuspid. There is moderate calcification of the aortic valve. There is moderate thickening of the aortic valve. Aortic valve regurgitation is not visualized. Aortic valve sclerosis is present, with no evidence of aortic valve stenosis.  6. The inferior vena cava is normal in size with greater than 50% respiratory variability, suggesting right atrial pressure of 3 mmHg.  7. Cannot exclude a small PFO. FINDINGS  Left Ventricle: Left ventricular ejection fraction, by estimation, is 60 to 65%. The left ventricle has normal function. The left ventricle has no regional wall motion abnormalities. Strain was performed and the global longitudinal strain is indeterminate. The left ventricular internal cavity size was normal in size. There is mild left ventricular hypertrophy. Left ventricular diastolic parameters were normal. Right Ventricle: The right ventricular size is normal. No increase in right ventricular wall  thickness. Right ventricular systolic function is normal. Left Atrium: Left atrial size was mildly dilated. Right Atrium: Right atrial size was normal in size. Pericardium: There is no evidence of pericardial effusion. Mitral Valve: The mitral valve is abnormal. There is mild thickening of the mitral valve leaflet(s). There is mild calcification of the mitral valve leaflet(s). Trivial mitral valve regurgitation. No evidence of mitral valve stenosis. Tricuspid Valve: The tricuspid valve is normal in structure. Tricuspid valve regurgitation is mild . No evidence of tricuspid stenosis. Aortic Valve: Mean gradient 5 peak 10 mmhg but AVA 2.2 cm2. The aortic valve is tricuspid. There is moderate calcification of the aortic valve. There is moderate thickening of the aortic valve. Aortic valve regurgitation is not visualized. Aortic valve sclerosis is present, with no evidence of aortic valve stenosis. Aortic valve mean gradient measures 5.0 mmHg. Aortic valve peak gradient measures 9.5 mmHg. Aortic valve area, by VTI measures 2.25 cm. Pulmonic Valve: The pulmonic valve was normal in structure. Pulmonic valve regurgitation is not visualized. No evidence of pulmonic stenosis. Aorta: The aortic root is normal in size and structure. Venous: The inferior vena cava is normal in size with greater than 50% respiratory variability, suggesting right atrial pressure of 3 mmHg. IAS/Shunts: Cannot exclude a small PFO. Additional Comments: 3D was performed not requiring image post processing on an independent  workstation and was indeterminate.  LEFT VENTRICLE PLAX 2D LVIDd:         4.10 cm   Diastology LVIDs:         2.70 cm   LV e' medial:    6.96 cm/s LV PW:         1.00 cm   LV E/e' medial:  10.8 LV IVS:        1.20 cm   LV e' lateral:   11.70 cm/s LVOT diam:     1.80 cm   LV E/e' lateral: 6.4 LV SV:         62 LV SV Index:   34 LVOT Area:     2.54 cm  RIGHT VENTRICLE RV S prime:     19.70 cm/s TAPSE (M-mode): 2.6 cm LEFT ATRIUM              Index        RIGHT ATRIUM           Index LA diam:        4.00 cm 2.22 cm/m   RA Area:     13.30 cm LA Vol (A2C):   50.0 ml 27.78 ml/m  RA Volume:   30.10 ml  16.72 ml/m LA Vol (A4C):   69.2 ml 38.45 ml/m LA Biplane Vol: 59.1 ml 32.83 ml/m  AORTIC VALVE AV Area (Vmax):    2.07 cm AV Area (Vmean):   2.24 cm AV Area (VTI):     2.25 cm AV Vmax:           154.00 cm/s AV Vmean:          107.000 cm/s AV VTI:            0.276 m AV Peak Grad:      9.5 mmHg AV Mean Grad:      5.0 mmHg LVOT Vmax:         125.50 cm/s LVOT Vmean:        94.200 cm/s LVOT VTI:          0.244 m LVOT/AV VTI ratio: 0.88  AORTA Ao Root diam: 3.10 cm MITRAL VALVE MV Area (PHT): 3.48 cm    SHUNTS MV Decel Time: 218 msec    Systemic VTI:  0.24 m MV E velocity: 75.20 cm/s  Systemic Diam: 1.80 cm MV A velocity: 58.50 cm/s MV E/A ratio:  1.29 Janelle Mediate MD Electronically signed by Janelle Mediate MD Signature Date/Time: 08/14/2023/1:10:05 PM    Final    DG Chest Portable 1 View Result Date: 08/14/2023 CLINICAL DATA:  Respiratory distress EXAM: PORTABLE CHEST 1 VIEW COMPARISON:  06/09/2023, 07/16/2023 FINDINGS: Cardiac shadow is within normal limits. Near complete consolidation of the right upper lobe is noted. Left perihilar opacity is noted as well. IMPRESSION: Increasing right upper lobe consolidation consistent with focal infiltrate. Left perihilar opacity and left retrocardiac opacity. Electronically Signed   By: Violeta Grey M.D.   On: 08/14/2023 02:34    Demaris Fillers, DO  [facility]  If 7PM-7AM, please contact night-coverage [email or web address] Password [facility] 08/19/2023, 5:43 PM   LOS: 5 days

## 2023-08-20 DIAGNOSIS — J181 Lobar pneumonia, unspecified organism: Secondary | ICD-10-CM | POA: Diagnosis not present

## 2023-08-20 DIAGNOSIS — J9621 Acute and chronic respiratory failure with hypoxia: Secondary | ICD-10-CM | POA: Diagnosis not present

## 2023-08-20 DIAGNOSIS — I5033 Acute on chronic diastolic (congestive) heart failure: Secondary | ICD-10-CM | POA: Diagnosis not present

## 2023-08-20 DIAGNOSIS — J441 Chronic obstructive pulmonary disease with (acute) exacerbation: Secondary | ICD-10-CM | POA: Diagnosis not present

## 2023-08-20 LAB — MAGNESIUM: Magnesium: 2.4 mg/dL (ref 1.7–2.4)

## 2023-08-20 LAB — BASIC METABOLIC PANEL WITH GFR
Anion gap: 11 (ref 5–15)
BUN: 37 mg/dL — ABNORMAL HIGH (ref 8–23)
CO2: 33 mmol/L — ABNORMAL HIGH (ref 22–32)
Calcium: 9.4 mg/dL (ref 8.9–10.3)
Chloride: 97 mmol/L — ABNORMAL LOW (ref 98–111)
Creatinine, Ser: 1.04 mg/dL — ABNORMAL HIGH (ref 0.44–1.00)
GFR, Estimated: 55 mL/min — ABNORMAL LOW (ref >60)
Glucose, Bld: 105 mg/dL — ABNORMAL HIGH (ref 70–99)
Potassium: 3.9 mmol/L (ref 3.5–5.1)
Sodium: 141 mmol/L (ref 135–145)

## 2023-08-20 MED ORDER — TORSEMIDE 20 MG PO TABS
40.0000 mg | ORAL_TABLET | Freq: Every day | ORAL | Status: DC
  Filled 2023-08-20: qty 2

## 2023-08-20 MED ORDER — PREDNISONE 10 MG PO TABS: 50.0000 mg | ORAL_TABLET | Freq: Every day | ORAL | 0 refills | Status: DC

## 2023-08-20 MED ORDER — TORSEMIDE 20 MG PO TABS: 40.0000 mg | ORAL_TABLET | Freq: Every day | ORAL | 1 refills | Status: DC

## 2023-08-20 MED ORDER — HYDRALAZINE HCL 25 MG PO TABS: 25.0000 mg | ORAL_TABLET | Freq: Two times a day (BID) | ORAL | 1 refills | Status: DC

## 2023-08-20 NOTE — Care Management Important Message (Signed)
 Important Message  Patient Details  Name: Diane Cohen MRN: [id number] Date of Birth: [birth year]   Important Message Given:  Yes - Medicare IM     Shady Bradish L Jayce Boyko 08/20/2023, 10:51 AM

## 2023-08-20 NOTE — Discharge Summary (Addendum)
 Physician Discharge Summary   Patient: Diane Cohen MRN: [id number] DOB: 1946/01/10  Admit date:     08/14/2023  Discharge date: 08/20/23  Discharge Physician: Myrtie Atkinson [facility]   PCP: Omie Bickers, MD   Recommendations at discharge:   Please follow up with primary care provider within 1-2 weeks  Please repeat BMP and CBC in one week    Hospital Course: 78 y.o. female with medical history significant of atrial fibrillation, hypothyroidism, heart failure, COPD, and hyperlipidemia, who presented with dyspnea.  Pt reported 4 days of progressive and worsening dyspnea, associated with cough, increase sputum production and wheezing. Severe weakness. She used her inhalers and nebulizer with no significant improvement in her symptoms. Positive fever but no chills.  Pt said she had recently been treated for bronchitis at urgent care with steroids and antibiotics. She initially felt better but now symptoms have returned.   In ED her 02 saturation was 84%, she had increased work of breathing and placed on bipap.  She is being admitted for pneumonia, COPD exacerbation and acute HFpEF.    Assessment and Plan: Acute on chronic respiratory failure with hypoxia -due to COPD exacerbation, pneumonia and CHF -On 2 L nasal cannula at nighttime only at home -initially requiring 3 L nasal cannula while awake -wean oxygen  for saturation >92% -desaturated to 86% on RA with ambulation on day of d/c>>d/c home with 2L   Lobar pneumonia -Sepsis ruled out -Elevated lactate thought secondary to hypoxia  -Continue Ceftriaxone /Azithromycin . -finished 7 days ceftriaxone  and azithro during hospitalization   CKD stage 3a, GFR 45-59 ml/min -baseline creatinine 0.9-1.2 -serum creatinine 1.04 on day of d/c   COPD with acute exacerbation  -added brovana  -added Yulpelri -continue albuterol  nebs -continue solumedrol -d/c home with prednisone  taper   Acute on chronic diastolic CHF (congestive heart failure) -08/14/23 Echo EF  60-65%, no wMA, normal RVF, trivial MR -continue metoprolol  and dapagliflozin   -S/p lasix  IV on 6/7, resumed daily home oral torsemide  on 6/9 -redose lasix  x 1 on 6/11 and 6/12 -ReDS = 34 on day of d/c -dc with increase dose torsemide  (40 mg) as pt stated she occasionally took extra 20 mg (above baseline 20 mg daily) -weight 178>>174 -continue Farxiga    Paroxysmal atrial fibrillation -Continue amiodarone , metoprolol  and diltiazem  for rate control -continue Xarelto    Hypothyroidism Continue levothyroxine     Essential hypertension Hypertensive Urgency - resolved  -improved after resuming home metoprolol  and diltiazem .  -hydralazine  added          Consultants: none Procedures performed: none  Disposition: Home Diet recommendation:  Cardiac diet DISCHARGE MEDICATION: Allergies as of 08/20/2023       Reactions   Levaquin [levofloxacin] Other (See Comments)   Headache    Sulfa Antibiotics Hives        Medication List     STOP taking these medications    lisinopril  10 MG tablet Commonly known as: ZESTRIL        TAKE these medications    amiodarone  200 MG tablet Commonly known as: PACERONE  TAKE 1 TABLET(200 MG) BY MOUTH DAILY What changed: See the new instructions.   atorvastatin  20 MG tablet Commonly known as: LIPITOR Take 20 mg by mouth daily.   benzonatate 200 MG capsule Commonly known as: TESSALON Take 200 mg by mouth 2 (two) times daily as needed.   diltiazem  180 MG 24 hr capsule Commonly known as: CARDIZEM  CD TAKE 1 CAPSULE(180 MG) BY MOUTH DAILY What changed: See the new instructions.   DULoxetine  60  MG capsule Commonly known as: CYMBALTA  Take 1 capsule (60 mg total) by mouth daily.   Farxiga  10 MG Tabs tablet Generic drug: dapagliflozin  propanediol TAKE 1 TABLET(10 MG) BY MOUTH DAILY BEFORE BREAKFAST What changed: See the new instructions.   Fusion Plus Caps Take 1 capsule by mouth daily.   guaiFENesin  100 MG/5ML liquid Commonly  known as: ROBITUSSIN Take 10 mLs by mouth every 6 (six) hours as needed for cough or to loosen phlegm.   hydrALAZINE  25 MG tablet Commonly known as: APRESOLINE  Take 1 tablet (25 mg total) by mouth 2 (two) times daily.   ipratropium 0.06 % nasal spray Commonly known as: ATROVENT  Place 2 sprays into both nostrils 3 (three) times daily. What changed:  when to take this reasons to take this   ipratropium-albuterol  0.5-2.5 (3) MG/3ML Soln Commonly known as: DUONEB Take 3 mLs by nebulization every 6 (six) hours as needed. What changed: reasons to take this   levothyroxine  150 MCG tablet Commonly known as: SYNTHROID  Take 150 mcg by mouth daily. What changed: Another medication with the same name was removed. Continue taking this medication, and follow the directions you see here.   metoprolol  succinate 25 MG 24 hr tablet Commonly known as: Toprol  XL Take 1 tablet (25 mg total) by mouth daily. What changed: how much to take   potassium chloride  SA 20 MEQ tablet Commonly known as: KLOR-CON  M TAKE 1 TABLET(20 MEQ) BY MOUTH DAILY What changed: See the new instructions.   predniSONE  10 MG tablet Commonly known as: DELTASONE  Take 5 tablets (50 mg total) by mouth daily with breakfast. And decrease by one tablet daily Start taking on: August 21, 2023   pregabalin  75 MG capsule Commonly known as: Lyrica  Take 1 capsule (75 mg total) by mouth at bedtime. For restless leg symptoms   Symbicort 160-4.5 MCG/ACT inhaler Generic drug: budesonide -formoterol Inhale 2 puffs into the lungs 2 (two) times daily.   torsemide  20 MG tablet Commonly known as: DEMADEX  Take 2 tablets (40 mg total) by mouth daily. Start taking on: August 21, 2023 What changed: how much to take   Xarelto  15 MG Tabs tablet Generic drug: Rivaroxaban  TAKE 1 TABLET(15 MG) BY MOUTH DAILY WITH SUPPER What changed: See the new instructions.               Durable Medical Equipment  (From admission, onward)            Start     Ordered   08/20/23 1122  For home use only DME oxygen   Once       Comments: POC for CHF  Question Answer Comment  Length of Need 6 Months   Mode or (Route) Nasal cannula   Liters per Minute 2   Frequency Continuous (stationary and portable oxygen  unit needed)   Oxygen  delivery system Gas      08/20/23 1122            Follow-up Information     [facility] Follow up.   Why: Home Health will call to schedule first home visit for care.               Discharge Exam: Filed Weights   08/14/23 0210 08/14/23 1330 08/18/23 0926  Weight: 81 kg 79.4 kg 79.3 kg   HEENT:  [place]/AT, No thrush, no icterus CV:  RRR, no rub, no S3, no S4 Lung:  fine bibasilar crackles. No wheeze Abd:  soft/+BS, NT Ext:  No edema, no lymphangitis, no  synovitis, no rash   Condition at discharge: stable  The results of significant diagnostics from this hospitalization (including imaging, microbiology, ancillary and laboratory) are listed below for reference.   Imaging Studies: DG CHEST PORT 1 VIEW Result Date: 08/16/2023 CLINICAL DATA:  COPD EXAM: PORTABLE CHEST 1 VIEW COMPARISON:  Chest x-ray performed August 14, 2023 FINDINGS: Previously observed right upper lung zone airspace infiltrate is improved. Heart mediastinum are not significantly changed. Interstitial changes, improving. No significant pleural effusion or pneumothorax. IMPRESSION: 1. Improvement in right upper lung zone airspace infiltrate. Electronically Signed   By: Reagan Camera M.D.   On: 08/16/2023 08:24   ECHOCARDIOGRAM COMPLETE Result Date: 08/14/2023    ECHOCARDIOGRAM REPORT   Patient Name:   Diane Cohen Date of Exam: 08/14/2023 Medical Rec #:  [id number]     Height:       61.0 in Accession #:    [id number]    Weight:       178.6 lb Date of Birth:  [birth year]     BSA:          1.800 m Patient Age:    77 years      BP:           131/59 mmHg Patient Gender: F             HR:           87 bpm. Exam [facility] Procedure: 2D Echo, Cardiac Doppler and Color Doppler (Both Spectral and Color            Flow Doppler were utilized during procedure). Indications:    Dyspnea R06.00  History:        Patient has prior history of Echocardiogram examinations. CHF,                 COPD, Arrythmias:Atrial Fibrillation; Risk Factors:Hypertension.  Sonographer:    Denese Finn RCS Referring Phys: [id number] MAURICIO DANIEL ARRIEN IMPRESSIONS  1. Left ventricular ejection fraction, by estimation, is 60 to 65%. The left ventricle has normal function. The left ventricle has no regional wall motion abnormalities. There is mild left ventricular hypertrophy. Left ventricular diastolic parameters were normal.  2. Right ventricular systolic function is normal. The right ventricular size is normal.  3. Left atrial size was mildly dilated.  4. The mitral valve is abnormal. Trivial mitral valve regurgitation. No evidence of mitral stenosis.  5. Mean gradient 5 peak 10 mmhg but AVA 2.2 cm2. The aortic valve is tricuspid. There is moderate calcification of the aortic valve. There is moderate thickening of the aortic valve. Aortic valve regurgitation is not visualized. Aortic valve sclerosis is present, with no evidence of aortic valve stenosis.  6. The inferior vena cava is normal in size with greater than 50% respiratory variability, suggesting right atrial pressure of 3 mmHg.  7. Cannot exclude a small PFO. FINDINGS  Left Ventricle: Left ventricular ejection fraction, by estimation, is 60 to 65%. The left ventricle has normal function. The left ventricle has no regional wall motion abnormalities. Strain was performed and the global longitudinal strain is indeterminate. The left ventricular internal cavity size was normal in size. There is mild left ventricular hypertrophy. Left ventricular diastolic parameters were normal. Right Ventricle: The right ventricular size is normal. No increase in right ventricular wall thickness. Right ventricular  systolic function is normal. Left Atrium: Left atrial size was mildly dilated. Right Atrium: Right atrial size was normal in size. Pericardium: There is no evidence  of pericardial effusion. Mitral Valve: The mitral valve is abnormal. There is mild thickening of the mitral valve leaflet(s). There is mild calcification of the mitral valve leaflet(s). Trivial mitral valve regurgitation. No evidence of mitral valve stenosis. Tricuspid Valve: The tricuspid valve is normal in structure. Tricuspid valve regurgitation is mild . No evidence of tricuspid stenosis. Aortic Valve: Mean gradient 5 peak 10 mmhg but AVA 2.2 cm2. The aortic valve is tricuspid. There is moderate calcification of the aortic valve. There is moderate thickening of the aortic valve. Aortic valve regurgitation is not visualized. Aortic valve sclerosis is present, with no evidence of aortic valve stenosis. Aortic valve mean gradient measures 5.0 mmHg. Aortic valve peak gradient measures 9.5 mmHg. Aortic valve area, by VTI measures 2.25 cm. Pulmonic Valve: The pulmonic valve was normal in structure. Pulmonic valve regurgitation is not visualized. No evidence of pulmonic stenosis. Aorta: The aortic root is normal in size and structure. Venous: The inferior vena cava is normal in size with greater than 50% respiratory variability, suggesting right atrial pressure of 3 mmHg. IAS/Shunts: Cannot exclude a small PFO. Additional Comments: 3D was performed not requiring image post processing on an independent workstation and was indeterminate.  LEFT VENTRICLE PLAX 2D LVIDd:         4.10 cm   Diastology LVIDs:         2.70 cm   LV e' medial:    6.96 cm/s LV PW:         1.00 cm   LV E/e' medial:  10.8 LV IVS:        1.20 cm   LV e' lateral:   11.70 cm/s LVOT diam:     1.80 cm   LV E/e' lateral: 6.4 LV SV:         62 LV SV Index:   34 LVOT Area:     2.54 cm  RIGHT VENTRICLE RV S prime:     19.70 cm/s TAPSE (M-mode): 2.6 cm LEFT ATRIUM             Index        RIGHT  ATRIUM           Index LA diam:        4.00 cm 2.22 cm/m   RA Area:     13.30 cm LA Vol (A2C):   50.0 ml 27.78 ml/m  RA Volume:   30.10 ml  16.72 ml/m LA Vol (A4C):   69.2 ml 38.45 ml/m LA Biplane Vol: 59.1 ml 32.83 ml/m  AORTIC VALVE AV Area (Vmax):    2.07 cm AV Area (Vmean):   2.24 cm AV Area (VTI):     2.25 cm AV Vmax:           154.00 cm/s AV Vmean:          107.000 cm/s AV VTI:            0.276 m AV Peak Grad:      9.5 mmHg AV Mean Grad:      5.0 mmHg LVOT Vmax:         125.50 cm/s LVOT Vmean:        94.200 cm/s LVOT VTI:          0.244 m LVOT/AV VTI ratio: 0.88  AORTA Ao Root diam: 3.10 cm MITRAL VALVE MV Area (PHT): 3.48 cm    SHUNTS MV Decel Time: 218 msec    Systemic VTI:  0.24 m MV E velocity: 75.20 cm/s  Systemic Diam: 1.80 cm MV A velocity: 58.50 cm/s MV E/A ratio:  1.29 Janelle Mediate MD Electronically signed by Janelle Mediate MD Signature Date/Time: 08/14/2023/1:10:05 PM    Final    DG Chest Portable 1 View Result Date: 08/14/2023 CLINICAL DATA:  Respiratory distress EXAM: PORTABLE CHEST 1 VIEW COMPARISON:  06/09/2023, 07/16/2023 FINDINGS: Cardiac shadow is within normal limits. Near complete consolidation of the right upper lobe is noted. Left perihilar opacity is noted as well. IMPRESSION: Increasing right upper lobe consolidation consistent with focal infiltrate. Left perihilar opacity and left retrocardiac opacity. Electronically Signed   By: Violeta Grey M.D.   On: 08/14/2023 02:34    Microbiology: Results for orders placed or performed during the hospital encounter of 08/14/23  Blood culture (routine x 2)     Status: None   Collection Time: 08/14/23  2:52 AM   Specimen: BLOOD RIGHT HAND  Result Value Ref Range Status   Specimen Description BLOOD RIGHT HAND  Final   Special Requests   Final    BOTTLES DRAWN AEROBIC AND ANAEROBIC Blood Culture adequate volume   Culture   Final    NO GROWTH 5 DAYS Performed at [facility], [address] [place], [place] [address]    Report  Status 08/19/2023 FINAL  Final  Blood culture (routine x 2)     Status: None   Collection Time: 08/14/23  3:20 AM   Specimen: BLOOD RIGHT HAND  Result Value Ref Range Status   Specimen Description BLOOD RIGHT HAND  Final   Special Requests   Final    BOTTLES DRAWN AEROBIC ONLY Blood Culture adequate volume   Culture   Final    NO GROWTH 5 DAYS Performed at [facility], [address], [place], [place] [address]    Report Status 08/19/2023 FINAL  Final  MRSA Next Gen by PCR, Nasal     Status: None   Collection Time: 08/16/23  9:44 AM   Specimen: Nasal Mucosa; Nasal Swab  Result Value Ref Range Status   MRSA by PCR Next Gen NOT DETECTED NOT DETECTED Final    Comment: (NOTE) The GeneXpert MRSA Assay (FDA approved for NASAL specimens only), is one component of a comprehensive MRSA colonization surveillance program. It is not intended to diagnose MRSA infection nor to guide or monitor treatment for MRSA infections. Test performance is not FDA approved in patients less than 39 years old. Performed at [facility], [address], [place], [place] [address]   Respiratory (~20 pathogens) panel by PCR     Status: None   Collection Time: 08/18/23  5:45 PM   Specimen: Nasopharyngeal Swab; Respiratory  Result Value Ref Range Status   Adenovirus NOT DETECTED NOT DETECTED Final   Coronavirus 229E NOT DETECTED NOT DETECTED Final    Comment: (NOTE) The Coronavirus on the Respiratory Panel, DOES NOT test for the novel  Coronavirus (2019 nCoV)    Coronavirus HKU1 NOT DETECTED NOT DETECTED Final   Coronavirus NL63 NOT DETECTED NOT DETECTED Final   Coronavirus OC43 NOT DETECTED NOT DETECTED Final   Metapneumovirus NOT DETECTED NOT DETECTED Final   Rhinovirus / Enterovirus NOT DETECTED NOT DETECTED Final   Influenza A NOT DETECTED NOT DETECTED Final   Influenza B NOT DETECTED NOT DETECTED Final   Parainfluenza Virus 1 NOT DETECTED NOT DETECTED Final   Parainfluenza Virus 2 NOT DETECTED NOT  DETECTED Final   Parainfluenza Virus 3 NOT DETECTED NOT DETECTED Final   Parainfluenza Virus 4 NOT DETECTED NOT  DETECTED Final   Respiratory Syncytial Virus NOT DETECTED NOT DETECTED Final   Bordetella pertussis NOT DETECTED NOT DETECTED Final   Bordetella Parapertussis NOT DETECTED NOT DETECTED Final   Chlamydophila pneumoniae NOT DETECTED NOT DETECTED Final   Mycoplasma pneumoniae NOT DETECTED NOT DETECTED Final    Comment: Performed at [facility], [address], [place], [place] [address]    Labs: CBC: Recent Labs  Lab 08/14/23 0207 08/14/23 6045 08/15/23 0324 08/15/23 1157 08/16/23 0326 08/18/23 0415  WBC 14.6* 16.0* 20.6*  --  16.6* 8.6  NEUTROABS 8.9*  --  19.7*  --   --   --   HGB 15.1* 13.6 11.9* 13.4 11.9* 12.1  HCT 47.9* 42.8 36.8 41.3 37.2 38.3  MCV 104.8* 104.9* 103.7*  --  103.6* 101.9*  PLT 336 215 208  --  214 225   Basic Metabolic Panel: Recent Labs  Lab 08/15/23 0324 08/17/23 0454 08/18/23 0415 08/19/23 0412 08/20/23 0409  NA 141 139 138 139 141  K 4.2 4.4 4.0 4.1 3.9  CL 109 105 102 101 97*  CO2 26 26 27 30  33*  GLUCOSE 127* 125* 110* 107* 105*  BUN 19 37* 42* 36* 37*  CREATININE 0.79 1.04* 1.11* 1.04* 1.04*  CALCIUM  9.8 9.5 9.6 9.2 9.4  MG 2.2  --  2.4 2.3 2.4   Liver Function Tests: Recent Labs  Lab 08/14/23 0207  AST 34  ALT 26  ALKPHOS 114  BILITOT 0.9  PROT 7.6  ALBUMIN 3.9   CBG: No results for input(s): GLUCAP in the last 168 hours.  Discharge time spent: greater than 30 minutes.  Signed: Demaris Fillers, MD [facility] 08/20/2023

## 2023-08-20 NOTE — Plan of Care (Signed)

## 2023-08-20 NOTE — Plan of Care (Signed)
  Problem: Education: Goal: Knowledge of General Education information will improve Description: Including pain rating scale, medication(s)/side effects and non-pharmacologic comfort measures 08/20/2023 1223 by Goldie Later, RN Outcome: Adequate for Discharge 08/20/2023 0741 by Goldie Later, RN Outcome: Progressing   Problem: Health Behavior/Discharge Planning: Goal: Ability to manage health-related needs will improve 08/20/2023 1223 by Goldie Later, RN Outcome: Adequate for Discharge 08/20/2023 0741 by Goldie Later, RN Outcome: Progressing   Problem: Clinical Measurements: Goal: Ability to maintain clinical measurements within normal limits will improve 08/20/2023 1223 by Goldie Later, RN Outcome: Adequate for Discharge 08/20/2023 0741 by Goldie Later, RN Outcome: Progressing Goal: Will remain free from infection 08/20/2023 1223 by Goldie Later, RN Outcome: Adequate for Discharge 08/20/2023 0741 by Goldie Later, RN Outcome: Progressing Goal: Diagnostic test results will improve 08/20/2023 1223 by Goldie Later, RN Outcome: Adequate for Discharge 08/20/2023 0741 by Goldie Later, RN Outcome: Progressing Goal: Respiratory complications will improve 08/20/2023 1223 by Goldie Later, RN Outcome: Adequate for Discharge 08/20/2023 0741 by Goldie Later, RN Outcome: Progressing Goal: Cardiovascular complication will be avoided 08/20/2023 1223 by Goldie Later, RN Outcome: Adequate for Discharge 08/20/2023 0741 by Goldie Later, RN Outcome: Progressing   Problem: Activity: Goal: Risk for activity intolerance will decrease 08/20/2023 1223 by Goldie Later, RN Outcome: Adequate for Discharge 08/20/2023 0741 by Goldie Later, RN Outcome: Progressing   Problem: Nutrition: Goal: Adequate nutrition will be maintained 08/20/2023 1223 by Goldie Later, RN Outcome: Adequate for Discharge 08/20/2023 0741 by Goldie Later, RN Outcome: Progressing   Problem: Coping: Goal: Level of anxiety  will decrease 08/20/2023 1223 by Goldie Later, RN Outcome: Adequate for Discharge 08/20/2023 0741 by Goldie Later, RN Outcome: Progressing   Problem: Elimination: Goal: Will not experience complications related to bowel motility 08/20/2023 1223 by Goldie Later, RN Outcome: Adequate for Discharge 08/20/2023 0741 by Goldie Later, RN Outcome: Progressing Goal: Will not experience complications related to urinary retention 08/20/2023 1223 by Goldie Later, RN Outcome: Adequate for Discharge 08/20/2023 0741 by Goldie Later, RN Outcome: Progressing   Problem: Pain Managment: Goal: General experience of comfort will improve and/or be controlled 08/20/2023 1223 by Goldie Later, RN Outcome: Adequate for Discharge 08/20/2023 0741 by Goldie Later, RN Outcome: Progressing   Problem: Safety: Goal: Ability to remain free from injury will improve 08/20/2023 1223 by Goldie Later, RN Outcome: Adequate for Discharge 08/20/2023 0741 by Goldie Later, RN Outcome: Progressing   Problem: Skin Integrity: Goal: Risk for impaired skin integrity will decrease 08/20/2023 1223 by Goldie Later, RN Outcome: Adequate for Discharge 08/20/2023 0741 by Goldie Later, RN Outcome: Progressing   Problem: Fluid Volume: Goal: Hemodynamic stability will improve 08/20/2023 1223 by Goldie Later, RN Outcome: Adequate for Discharge 08/20/2023 0741 by Goldie Later, RN Outcome: Progressing   Problem: Clinical Measurements: Goal: Diagnostic test results will improve 08/20/2023 1223 by Goldie Later, RN Outcome: Adequate for Discharge 08/20/2023 0741 by Goldie Later, RN Outcome: Progressing Goal: Signs and symptoms of infection will decrease 08/20/2023 1223 by Goldie Later, RN Outcome: Adequate for Discharge 08/20/2023 0741 by Goldie Later, RN Outcome: Progressing   Problem: Respiratory: Goal: Ability to maintain adequate ventilation will improve 08/20/2023 1223 by Goldie Later, RN Outcome: Adequate for  Discharge 08/20/2023 0741 by Goldie Later, RN Outcome: Progressing

## 2023-08-20 NOTE — Progress Notes (Signed)
 SATURATION QUALIFICATIONS: (This note is used to comply with regulatory documentation for home oxygen )   Patient Saturations on Room Air at Rest = 93   Patient Saturations on Room Air while Ambulating = 86   Patient Saturations on 2 Liters of oxygen  while Ambulating = 95   Please briefly explain why patient needs home oxygen : To maintain 02 sat at 90% or above during ambulation.  Diane Fillers, DO

## 2023-08-20 NOTE — TOC Transition Note (Signed)
 Transition of Care [facility]) - Discharge Note   Patient Details  Name: Diane Cohen MRN: [id number] Date of Birth: February 17, 1946  [facility] CM/SW Contact:  Juanda Noon Phone Number: 08/20/2023, 10:47 AM   Clinical Narrative:     Patient is discharging today. Jennifer with [facility] was notified of DC today to follow up with patient at home. TOC signing off.   Final next level of care: Home w Home Health Services Barriers to Discharge: Barriers Resolved   Patient Goals and CMS Choice Patient states their goals for this hospitalization and ongoing recovery are:: DC back home CMS Medicare.gov Compare Post Acute Care list provided to:: Patient Choice offered to / list presented to : Patient      Discharge Placement              Patient chooses bed at:  (Patient is discharging home) Patient to be transferred to facility by: POV Name of family member notified: Patient- Felice Patient and family notified of of transfer: 08/20/23  Discharge Plan and Services Additional resources added to the After Visit Summary for   In-house Referral: Clinical Social Work   Post Acute Care Choice: Durable Medical Equipment          DME Arranged: N/A DME Agency: [facility]       HH Arranged: PT HH Agency: [facility] Date HH Agency Contacted: 08/20/23 Time HH Agency Contacted: 1044 Representative spoke with at [facility] Agency: [facility]  Social Drivers of Health (SDOH) Interventions SDOH Screenings   Food Insecurity: No Food Insecurity (08/14/2023)  Housing: Low Risk  (08/16/2023)  Transportation Needs: No Transportation Needs (08/14/2023)  Utilities: Not At Risk (08/14/2023)  Depression (PHQ2-9): Low Risk  (05/02/2018)  Social Connections: Moderately Integrated (08/14/2023)  Tobacco Use: Medium Risk (08/14/2023)     Readmission Risk Interventions    08/20/2023   10:35 AM 08/19/2023   12:08 PM 08/18/2023    2:27 PM  Readmission Risk Prevention Plan   Transportation Screening Complete Complete Complete  Home Care Screening Complete Complete Complete  Medication Review (RN CM) Complete Complete Complete

## 2023-08-21 DIAGNOSIS — J440 Chronic obstructive pulmonary disease with acute lower respiratory infection: Secondary | ICD-10-CM | POA: Diagnosis not present

## 2023-08-21 DIAGNOSIS — D631 Anemia in chronic kidney disease: Secondary | ICD-10-CM | POA: Diagnosis not present

## 2023-08-21 DIAGNOSIS — I5033 Acute on chronic diastolic (congestive) heart failure: Secondary | ICD-10-CM | POA: Diagnosis not present

## 2023-08-21 DIAGNOSIS — J9621 Acute and chronic respiratory failure with hypoxia: Secondary | ICD-10-CM | POA: Diagnosis not present

## 2023-08-21 DIAGNOSIS — A419 Sepsis, unspecified organism: Secondary | ICD-10-CM | POA: Diagnosis not present

## 2023-08-21 DIAGNOSIS — J181 Lobar pneumonia, unspecified organism: Secondary | ICD-10-CM | POA: Diagnosis not present

## 2023-08-21 DIAGNOSIS — J441 Chronic obstructive pulmonary disease with (acute) exacerbation: Secondary | ICD-10-CM | POA: Diagnosis not present

## 2023-08-21 DIAGNOSIS — N1831 Chronic kidney disease, stage 3a: Secondary | ICD-10-CM | POA: Diagnosis not present

## 2023-08-21 DIAGNOSIS — I130 Hypertensive heart and chronic kidney disease with heart failure and stage 1 through stage 4 chronic kidney disease, or unspecified chronic kidney disease: Secondary | ICD-10-CM | POA: Diagnosis not present

## 2023-08-23 ENCOUNTER — Telehealth: Payer: Self-pay

## 2023-08-23 NOTE — Transitions of Care (Post Inpatient/ED Visit) (Signed)
   08/23/2023  Name: Diane Cohen MRN: [id number] DOB: 1945/12/27  Today's TOC FU Call Status: Today's TOC FU Call Status:: Unsuccessful Call (1st Attempt) Unsuccessful Call (1st Attempt) Date: 08/23/23  Attempted to reach the patient regarding the most recent Inpatient/ED visit.  Follow Up Plan: Additional outreach attempts will be made to reach the patient to complete the Transitions of Care (Post Inpatient/ED visit) call.   Aja Whitehair J. Elleen Coulibaly RN, MSN [facility], [facility] RN Care Manager Direct Dial: [phone]  Fax: [phone] Website: [email or web address]

## 2023-08-24 ENCOUNTER — Telehealth: Payer: Self-pay

## 2023-08-24 NOTE — Patient Instructions (Signed)
 Visit Information  Thank you for taking time to visit with me today. Please don't hesitate to contact me if I can be of assistance to you before our next scheduled telephone appointment.  Our next appointment is by telephone on 08/31/23 at 1200 pm  Following is a copy of your care plan:   Goals Addressed             This Visit's Progress    [facility] Transitions of Care (TOC) Care Plan       Problems:  Recent Hospitalization for treatment of COPD and Pneumonia Knowledge Deficit Related to Pneumonia  Goal:  Over the next 30 days, the patient will not experience hospital readmission  Interventions:  Transitions of Care: Doctor Visits  - discussed the importance of doctor visits Reviewed Signs and symptoms of infection  COPD Interventions: Advised patient to self assesses COPD action plan zone and make appointment with provider if in the yellow zone for 48 hours without improvement Use of home oxygen  Discussed with patient use of Symbicort as prescribed  Reviewed pulmonologist appointment 08/30/23  Patient Self Care Activities:  Attend all scheduled provider appointments Call provider office for new concerns or questions  Notify RN Care Manager of [facility] call rescheduling needs Participate in Transition of Care Program/Attend [facility] scheduled calls Take medications as prescribed   follow rescue plan if symptoms flare-up Attend Pulmonology appointment next week  Plan:  Telephone follow up appointment with care management team member scheduled for:  08/31/23 @ 1200 pm       Pneumonia: Contact physician if: You have a new or higher fever. You are coughing more deeply or more often. You are not getting better after 2 days (48 hours). You do not get better as expected.  Patient verbalizes understanding of instructions and care plan provided today and agrees to view in MyChart. Active MyChart status and patient understanding of how to access instructions and care plan via MyChart  confirmed with patient.     The patient has been provided with contact information for the care management team and has been advised to call with any health related questions or concerns.   Please call the care guide team at [phone] if you need to cancel or reschedule your appointment.   Please call the Suicide and Crisis Lifeline: 988 if you are experiencing a Mental Health or Behavioral Health Crisis or need someone to talk to.   Lalonnie Shaffer J. Nieko Clarin RN, MSN [facility], [facility] RN Care Manager Direct Dial: [phone]  Fax: [phone] Website: [email or web address]

## 2023-08-28 DIAGNOSIS — N1831 Chronic kidney disease, stage 3a: Secondary | ICD-10-CM | POA: Diagnosis not present

## 2023-08-28 DIAGNOSIS — D631 Anemia in chronic kidney disease: Secondary | ICD-10-CM | POA: Diagnosis not present

## 2023-08-28 DIAGNOSIS — A419 Sepsis, unspecified organism: Secondary | ICD-10-CM | POA: Diagnosis not present

## 2023-08-28 DIAGNOSIS — J440 Chronic obstructive pulmonary disease with acute lower respiratory infection: Secondary | ICD-10-CM | POA: Diagnosis not present

## 2023-08-28 DIAGNOSIS — J9621 Acute and chronic respiratory failure with hypoxia: Secondary | ICD-10-CM | POA: Diagnosis not present

## 2023-08-28 DIAGNOSIS — I130 Hypertensive heart and chronic kidney disease with heart failure and stage 1 through stage 4 chronic kidney disease, or unspecified chronic kidney disease: Secondary | ICD-10-CM | POA: Diagnosis not present

## 2023-08-28 DIAGNOSIS — J441 Chronic obstructive pulmonary disease with (acute) exacerbation: Secondary | ICD-10-CM | POA: Diagnosis not present

## 2023-08-28 DIAGNOSIS — I5033 Acute on chronic diastolic (congestive) heart failure: Secondary | ICD-10-CM | POA: Diagnosis not present

## 2023-08-28 DIAGNOSIS — J181 Lobar pneumonia, unspecified organism: Secondary | ICD-10-CM | POA: Diagnosis not present

## 2023-08-30 ENCOUNTER — Encounter: Payer: Self-pay | Admitting: Primary Care

## 2023-08-30 ENCOUNTER — Ambulatory Visit (INDEPENDENT_AMBULATORY_CARE_PROVIDER_SITE_OTHER): Admitting: Primary Care

## 2023-08-30 ENCOUNTER — Ambulatory Visit (HOSPITAL_COMMUNITY)
Admission: RE | Admit: 2023-08-30 | Discharge: 2023-08-30 | Disposition: A | Discharge status: Home / Self Care | Source: Ambulatory Visit | Attending: Primary Care | Admitting: Primary Care

## 2023-08-30 VITALS — BP 145/74 | HR 89 | Ht 61.0 in | Wt 162.0 lb

## 2023-08-30 DIAGNOSIS — J189 Pneumonia, unspecified organism: Secondary | ICD-10-CM | POA: Insufficient documentation

## 2023-08-30 DIAGNOSIS — J9621 Acute and chronic respiratory failure with hypoxia: Secondary | ICD-10-CM | POA: Diagnosis not present

## 2023-08-30 DIAGNOSIS — Z87891 Personal history of nicotine dependence: Secondary | ICD-10-CM | POA: Diagnosis not present

## 2023-08-30 DIAGNOSIS — R918 Other nonspecific abnormal finding of lung field: Secondary | ICD-10-CM | POA: Diagnosis not present

## 2023-08-30 NOTE — Patient Instructions (Addendum)
 VISIT SUMMARY: Today, we discussed your respiratory status and oxygen  use. You have a history of chronic bronchitis and pneumonia, with recent hospitalizations. You are currently using oxygen  therapy at night and have been managing your symptoms with Symbicort and a nebulizer. We reviewed your recent health issues and made plans for further testing and management.  YOUR PLAN: -CHRONIC BRONCHITIS: Chronic bronchitis is a long-term inflammation of the airways in the lungs. We will continue your current treatment with Symbicort twice daily and use the nebulizer as needed for acute symptoms. We have also ordered a pulmonary function test to check for obstructive lung disease.  -RECURRENT PNEUMONIA: Pneumonia is an infection that inflames the air sacs in one or both lungs. You have recently been hospitalized for pneumonia, and while your symptoms have improved, we need to confirm the resolution of the infection. A chest x-ray has been ordered to ensure your right upper lobe pneumonia has cleared.  -OXYGEN  THERAPY DEPENDENCE: You are currently using oxygen  therapy at night. We will assess your oxygen  levels during exertion to determine if you still need it and if you qualify for a portable concentrator. We will also ensure the delivery of new oxygen  tanks. Your Oxygen  stayed above 92% on room air during walk test. Check your oxygen  levels at home periodically during rest and with activity. Use supplemental oxygen  if levels are < 90%. Continue to wear 2L oxygen  at night while sleeping.   -PULMONARY NODULES: Pulmonary nodules are small growths in the lungs. Your nodules are stable and not concerning for cancer, likely related to past infections or scarring. No further action is needed at this time.  -SCOLIOSIS: Scoliosis is a sideways curvature of the spine. Your scoliosis has been long-standing and does not significantly impact your respiratory function at this time. We will monitor it to ensure it does not  progress and restrict lung function.   INSTRUCTIONS: 1. Complete the pulmonary function test as ordered to assess for COPD/obstructive lung disease  2. Get a chest x-ray to confirm the resolution of your pneumonia. 3. We will assess your oxygen  levels on room air and during exertion to determine your ongoing need for oxygen  therapy and potential qualification for a portable concentrator. 4. Ensure the delivery of new oxygen  tanks.  Follow-up 3 months with Landry NP   Home Oxygen  Use, Adult When a medical condition keeps you from getting enough oxygen , your health care provider may have you use extra oxygen  at home. Your health care provider will let you know: When to use oxygen . How much oxygen  to use. The amount is set in liters per minute (LPM or L/M). How long to use oxygen . Home oxygen  can be given through: A mask. This covers the nose and mouth or covers a tracheotomy tube. A nasal cannula. This is a device or tube that goes in the nostrils. A transtracheal catheter. This is a small, thin tube placed through the neck and into the windpipe (trachea). A breathing tube (tracheostomy tube) that is surgically placed in the windpipe. These devices have tubing that connects to an oxygen  source, such as: A tank. Tanks hold oxygen  in gas form. The tank must be replaced when the oxygen  is used up. A liquid oxygen  device. This holds oxygen  in liquid form. This must be replaced when the oxygen  is used up. An oxygen  concentrator machine. This filters oxygen  in the room. It uses electricity so you must have a backup oxygen  tank in case the power goes out. Work with your  health care provider to find equipment that works best for you and your lifestyle. What are the risks? Your health care provider will talk with you about risks. These may include: Fire. This can happen if the oxygen  is exposed to a heat source, flame, or spark. Injury to the skin. This can happen if liquid oxygen  touches the skin.  Pressure sores may occur if the oxygen  tubing presses on the skin. Damage to the lungs or other organs. This can happen from getting too little or too much oxygen . Supplies needed: To use oxygen , you will need: A mask, nasal cannula, transtracheal catheter, or tracheostomy. An oxygen  tank, a liquid oxygen  device, or an oxygen  concentrator. Your health care provider may recommend: A humidifier. This device adds moisture to the oxygen . A pulse oximeter. This device measures the percentage of oxygen  in your blood. How to use oxygen  You will be shown how to use your oxygen  device. Follow the instructions, which may look something like this: Wash your hands with soap and water  for at least 20 seconds. Turn on the oxygen . Make sure the oxygen  unit is working right. To do this: Place the end of the oxygen  tubing in a cup of water  before connecting it to the nasal cannula, mask, or transtracheal catheter. The water  will bubble if oxygen  is flowing. Place one end of the oxygen  tubing into the port on the tank, device, or machine. Connect the other end to the nasal cannula, mask, or transtracheal catheter. Turn the liter-flow setting on the machine to the level you are told. Place the nasal cannula in your nose, or place the mask over your mouth and nose or tracheostomy tube. Turn off the oxygen  when you are not using it. How to clean and care for the oxygen  supplies Clean or replace your oxygen  equipment and supplies as told by the medical device company that supplies the equipment. Safety tips Lexicographer  Keep your oxygen  and oxygen  supplies at least 6 ft (2 m) away from sources of heat, flames, and sparks at all times. Do not allow smoking near your oxygen . Put up no smoking signs in your home. Avoid smoking areas in public. Do not use materials that can burn (are flammable) while you use oxygen . This includes: Petroleum jelly. Hand sanitizer. Rubbing alcohol. Hair spray or other  aerosol sprays. Keep a Government social research officer nearby. Tell your fire department that you have oxygen  in your home. Test your home smoke detectors often. Traveling Secure your oxygen  tank in the vehicle so that it does not move. Follow instructions from your medical device company about how to safely secure your tank. Have enough oxygen  for the amount of time you will be away from home. If you plan to travel by public transportation such as airplane, train, bus, or boat, contact the company to arrange a portable oxygen  delivery system. You may need documents from your health care provider and medical device company before you travel. General safety tips Keep extra supplies on hand, including extra tubing and an extra cannula or mask. If you use an oxygen  cylinder, keep it in a stand or secure it to an object that will not move. If you use liquid oxygen , always keep the container upright. If you use an oxygen  concentrator: [employer] company. Make sure you are given priority service if your power goes out. Avoid using extension cords if possible. Keep an extra supply of backup oxygen  tanks. Follow these instructions at home: Use oxygen  only as  told by your health care provider. Do not use alcohol or other drugs that make you relax (sedating drugs) unless told. They can slow down your breathing rate and make it hard to get in enough oxygen . Know how and when to order a refill of oxygen . Plan for holidays when you may not be able to get a prescription filled. Use water -based lubricants on your lips or nostrils. Do not use oil-based products like petroleum jelly. Ask your health care provider how to prevent skin irritation on your cheeks or behind your ears. Contact a health care provider if: You are more tired than normal and have little energy. You have dry or irritated skin in your nose or on your face. You have nosebleeds. You are restless, irritable, or anxious. You get headaches  often. You are not sleeping well. Get help right away if: You have trouble breathing. You are confused. You are sleepy all the time. You have blue lips or fingernails. These symptoms may be an emergency. Get help right away. Call 911. Do not wait to see if the symptoms will go away. Do not drive yourself to the hospital. This information is not intended to replace advice given to you by your health care provider. Make sure you discuss any questions you have with your health care provider. Document Revised: 09/23/2021 Document Reviewed: 09/23/2021 Elsevier Patient Education  2024 [employer].

## 2023-08-30 NOTE — Progress Notes (Signed)
 @Patient  ID: Diane Cohen, female    DOB: [birth year], 78 y.o.   MRN: [id number]  No chief complaint on file.   Referring provider: Shona Norleen PEDLAR, MD  HPI: 78 year old female, former smoker. PMH significant for afib, CHF, HTN, hypothyroidism, anemia.  Previous [facility] pulmonary encounter: 08/19/2022 Patient presents today for sleep consult. Patient was referred today by primary care due to A-fib.  Patient has symptoms of snoring.  Mild associated daytime fatigue.  She reports sleeping well at night. Patient reports most recent episode of A-fib was last week. Typical bedtime is between 1 AM and 4 AM.  It does not take her long to fall asleep.  She does not wake up after falling asleep.  She starts her day between 930 and 10 AM.  She had a previous study done in [place]  approximately 10 years ago, results are not available.  She is not currently on CPAP and does not wear oxygen .  No respiratory complaints.  No concern for narcolepsy or cataplexy.  Epworth score at 16.  04/26/2023 Discussed the use of AI scribe software for clinical note transcription with the patient, who gave verbal consent to proceed.  History of Present Illness   Diane Cohen is a 78 year old female with atrial fibrillation who presents with sleep disturbances and nocturnal hypoxemia. She was referred by primary care for evaluation of atrial fibrillation.  PSG on 11/27/22 was negative for obstructive sleep apnea, AHI 2.1/hour. Supplemental O2 was added. Th patient snored with moderate volume along with PACs. Dx nocturnal hypoxia and periodic limb movement was noted   She experiences nocturnal hypoxemia with oxygen  levels dropping into the 80s during sleep, as identified in a recent sleep study. This has necessitated the use of supplemental oxygen . She has been provided with an oxygen  concentrator set at two liters but reports inconsistent use due to its loudness of the machine.  She reports sleep disturbances characterized by  snoring and daytime fatigue. Her typical bedtime is between 1 and 4 AM, and she usually falls asleep quickly without waking up during the night. However, she has been waking up more frequently recently. She starts her day between 9:30 and 10 AM. A sleep study revealed moderate loud snoring and premature atrial contractions, which may be related to the oxygen  drops at night. Occasionally, she wakes herself up with snoring. Her husband, who used to comment on her snoring, passed away two years ago, so she no longer has someone to report on her snoring.  She experiences periodic limb movements during sleep, which sometimes wake her up. She has not been on CPAP or oxygen  therapy previously.      Nocturnal Hypoxemia PSG in September 2024 was negative for OSA but oxygen  saturation levels did drop in the 80s. Patient has been provided with supplemental oxygen  but reports inconsistent use due to noise from the machine. -Encourage consistent use of supplemental oxygen  at 1-2 liters during sleep. -Consider contacting oxygen  supply company for potential servicing if noise continues to be disruptive.   Periodic Limb Movement Limb movements were noted during in-lab sleep study which were associated with arousals. Patient reports these movements are disruptive to sleep. -Recommend starting Lyrica  75mg  at bedtime to help prevent restless legs and periodic limb movements. -Schedule follow-up in 3-4 months to assess effectiveness of medication, with option for patient to contact sooner via MyChart or phone call if adjustment is needed.   Snoring Patient reports occasional snoring and waking herself up. No  significant obstructive sleep apnea noted in sleep study. -Advise patient to avoid sleeping on back and consider use of a wedge pillow to potentially reduce snoring. -Reiterate that CPAP is not needed based on sleep study results.   Atrial Fibrillation History of atrial fibrillation, with recent report of no  attacks. Noted premature atrial contractions during sleep study, potentially related to nocturnal hypoxemia. -Continue current management plan for atrial fibrillation as directed by cardiologist. -Encourage consistent use of supplemental oxygen  during sleep to potentially reduce cardiac symptoms.   08/30/2023- Interim hx  Discussed the use of AI scribe software for clinical note transcription with the patient, who gave verbal consent to proceed.  History of Present Illness   Diane Cohen is a 78 year old female with chronic bronchitis who presents for evaluation of her respiratory status and oxygen  use.  She has a history of pneumonia and chronic bronchitis, with a recent hospitalization from March 28 to June 12, 2023, for pneumonia. During her stay, she was treated with oxygen , ceftriaxone , azithromycin , steroids, bronchodilators, and nebulizers. Upon discharge, she was given a nebulizer and later prescribed an inhaler. A follow-up on June 21, 2023, resulted in a prescription for Symbicort.  In May 2025, she visited urgent care with chest congestion, fever, and shortness of breath, leading to a diagnosis of chronic bronchitis. Treatment included Augmentin , prednisone , and guaifenesin . She was hospitalized again on August 14, 2023, initially treated for pneumonia, and later informed of a possible COPD diagnosis. She feels better today than in the past, though she experienced weakness and shakiness after her second hospitalization.  She uses oxygen  primarily at night with a home concentrator. Her portable oxygen  tank is nearly empty despite recent delivery, having been used for three outings. She is interested in a Designer, jewellery for easier mobility. No current cough, mucus production, or shortness of breath. She uses Symbicort twice daily and a nebulizer for acute symptoms.  She is a former smoker, having quit many years ago, with no significant occupational exposures. She has a history of  asthma as a child and scoliosis, which has worsened with age.      Allergies  Allergen Reactions   Levaquin [Levofloxacin] Other (See Comments)    Headache    Sulfa Antibiotics Hives    Immunization History  Administered Date(s) Administered   Influenza, High Dose Seasonal PF 01/15/2018   Influenza,inj,Quad PF,6+ Mos 12/29/2013   Influenza-Unspecified 02/07/2015   Pneumococcal Polysaccharide-23 12/28/2016    Past Medical History:  Diagnosis Date   Anxiety    Atrial fibrillation (HCC)    Atrial fibrillation ablation in 2019 - Dr. Kelsie   Chronic diastolic heart failure (HCC)    Depression    Essential hypertension    History of TIA (transient ischemic attack)    Hyperlipidemia    Hypothyroidism    Pneumonia     Tobacco History: Social History   Tobacco Use  Smoking Status Former   Current packs/day: 0.00   Types: Cigarettes   Quit date: 01/22/1973   Years since quitting: 50.6  Smokeless Tobacco Never   Counseling given: Not Answered   Outpatient Medications Prior to Visit  Medication Sig Dispense Refill   amiodarone  (PACERONE ) 200 MG tablet TAKE 1 TABLET(200 MG) BY MOUTH DAILY 90 tablet 3   atorvastatin  (LIPITOR) 20 MG tablet Take 20 mg by mouth daily.     benzonatate (TESSALON) 200 MG capsule Take 200 mg by mouth 2 (two) times daily as needed.     diltiazem  (CARDIZEM   CD) 180 MG 24 hr capsule TAKE 1 CAPSULE(180 MG) BY MOUTH DAILY 90 capsule 3   DULoxetine  (CYMBALTA ) 60 MG capsule Take 1 capsule (60 mg total) by mouth daily. 30 capsule 1   FARXIGA  10 MG TABS tablet TAKE 1 TABLET(10 MG) BY MOUTH DAILY BEFORE BREAKFAST 90 tablet 3   guaiFENesin  (ROBITUSSIN) 100 MG/5ML liquid Take 10 mLs by mouth every 6 (six) hours as needed for cough or to loosen phlegm. (Patient not taking: Reported on 08/24/2023) 300 mL 0   hydrALAZINE  (APRESOLINE ) 25 MG tablet Take 1 tablet (25 mg total) by mouth 2 (two) times daily. 60 tablet 1   ipratropium (ATROVENT ) 0.06 % nasal spray Place  2 sprays into both nostrils 3 (three) times daily. 15 mL 0   ipratropium-albuterol  (DUONEB) 0.5-2.5 (3) MG/3ML SOLN Take 3 mLs by nebulization every 6 (six) hours as needed. 360 mL 1   Iron-FA-B Cmp-C-Biot-Probiotic (FUSION PLUS) CAPS Take 1 capsule by mouth daily.     levothyroxine  (SYNTHROID ) 150 MCG tablet Take 150 mcg by mouth daily.     metoprolol  succinate (TOPROL  XL) 25 MG 24 hr tablet Take 1 tablet (25 mg total) by mouth daily. 90 tablet 2   potassium chloride  SA (KLOR-CON  M) 20 MEQ tablet TAKE 1 TABLET(20 MEQ) BY MOUTH DAILY 90 tablet 3   predniSONE  (DELTASONE ) 10 MG tablet Take 5 tablets (50 mg total) by mouth daily with breakfast. And decrease by one tablet daily 15 tablet 0   pregabalin  (LYRICA ) 75 MG capsule Take 1 capsule (75 mg total) by mouth at bedtime. For restless leg symptoms 30 capsule 5   Rivaroxaban  (XARELTO ) 15 MG TABS tablet TAKE 1 TABLET(15 MG) BY MOUTH DAILY WITH SUPPER 90 tablet 1   SYMBICORT 160-4.5 MCG/ACT inhaler Inhale 2 puffs into the lungs 2 (two) times daily. (Patient not taking: Reported on 08/24/2023)     torsemide  (DEMADEX ) 20 MG tablet Take 2 tablets (40 mg total) by mouth daily. 60 tablet 1   No facility-administered medications prior to visit.      Review of Systems  Review of Systems  Constitutional: Negative.   HENT: Negative.    Respiratory: Negative.  Negative for cough, shortness of breath and wheezing.    Physical Exam  There were no vitals taken for this visit. Physical Exam Constitutional:      Appearance: Normal appearance.  HENT:     Head: Normocephalic and atraumatic.   Cardiovascular:     Rate and Rhythm: Normal rate and regular rhythm.  Pulmonary:     Effort: Pulmonary effort is normal.     Breath sounds: Normal breath sounds. No wheezing or rhonchi.     Comments: CTA  Neurological:     General: No focal deficit present.     Mental Status: She is alert and oriented to person, place, and time. Mental status is at baseline.    Psychiatric:        Mood and Affect: Mood normal.        Behavior: Behavior normal.        Thought Content: Thought content normal.        Judgment: Judgment normal.      Lab Results:  CBC    Component Value Date/Time   WBC 8.6 08/18/2023 0415   RBC 3.76 (L) 08/18/2023 0415   HGB 12.1 08/18/2023 0415   HGB 12.4 01/20/2017 1538   HCT 38.3 08/18/2023 0415   HCT 39.5 01/20/2017 1538   PLT 225 08/18/2023 0415  PLT 299 01/20/2017 1538   MCV 101.9 (H) 08/18/2023 0415   MCV 91 01/20/2017 1538   MCH 32.2 08/18/2023 0415   MCHC 31.6 08/18/2023 0415   RDW 15.6 (H) 08/18/2023 0415   RDW 19.2 (H) 01/20/2017 1538   LYMPHSABS 0.4 (L) 08/15/2023 0324   LYMPHSABS 1.2 01/20/2017 1538   MONOABS 0.3 08/15/2023 0324   EOSABS 0.0 08/15/2023 0324   EOSABS 0.1 01/20/2017 1538   BASOSABS 0.0 08/15/2023 0324   BASOSABS 0.0 01/20/2017 1538    BMET    Component Value Date/Time   NA 141 08/20/2023 0409   NA 142 01/20/2017 1538   K 3.9 08/20/2023 0409   CL 97 (L) 08/20/2023 0409   CO2 33 (H) 08/20/2023 0409   GLUCOSE 105 (H) 08/20/2023 0409   BUN 37 (H) 08/20/2023 0409   BUN 14 01/20/2017 1538   CREATININE 1.04 (H) 08/20/2023 0409   CREATININE 0.94 (H) 12/22/2018 1313   CALCIUM  9.4 08/20/2023 0409   GFRNONAA 55 (L) 08/20/2023 0409   GFRAA 48 (L) 12/01/2019 1506    BNP    Component Value Date/Time   BNP 114.0 (H) 08/19/2023 0412    ProBNP    Component Value Date/Time   PROBNP 1,859.0 (H) 12/29/2013 0520    Imaging: DG CHEST PORT 1 VIEW Result Date: 08/16/2023 CLINICAL DATA:  COPD EXAM: PORTABLE CHEST 1 VIEW COMPARISON:  Chest x-ray performed August 14, 2023 FINDINGS: Previously observed right upper lung zone airspace infiltrate is improved. Heart mediastinum are not significantly changed. Interstitial changes, improving. No significant pleural effusion or pneumothorax. IMPRESSION: 1. Improvement in right upper lung zone airspace infiltrate. Electronically Signed   By: Maude Naegeli M.D.   On: 08/16/2023 08:24   ECHOCARDIOGRAM COMPLETE Result Date: 08/14/2023    ECHOCARDIOGRAM REPORT   Patient Name:   Diane Cohen Date of Exam: 08/14/2023 Medical Rec #:  [id number]     Height:       61.0 in Accession #:    [id number]    Weight:       178.6 lb Date of Birth:  [birth year]     BSA:          1.800 m Patient Age:    77 years      BP:           131/59 mmHg Patient Gender: F             HR:           87 bpm. Exam Location:  [facility] Procedure: 2D Echo, Cardiac Doppler and Color Doppler (Both Spectral and Color            Flow Doppler were utilized during procedure). Indications:    Dyspnea R06.00  History:        Patient has prior history of Echocardiogram examinations. CHF,                 COPD, Arrythmias:Atrial Fibrillation; Risk Factors:Hypertension.  Sonographer:    Aida Pizza RCS Referring Phys: [id number] MAURICIO DANIEL ARRIEN IMPRESSIONS  1. Left ventricular ejection fraction, by estimation, is 60 to 65%. The left ventricle has normal function. The left ventricle has no regional wall motion abnormalities. There is mild left ventricular hypertrophy. Left ventricular diastolic parameters were normal.  2. Right ventricular systolic function is normal. The right ventricular size is normal.  3. Left atrial size was mildly dilated.  4. The mitral valve is abnormal. Trivial mitral valve regurgitation. No evidence of mitral  stenosis.  5. Mean gradient 5 peak 10 mmhg but AVA 2.2 cm2. The aortic valve is tricuspid. There is moderate calcification of the aortic valve. There is moderate thickening of the aortic valve. Aortic valve regurgitation is not visualized. Aortic valve sclerosis is present, with no evidence of aortic valve stenosis.  6. The inferior vena cava is normal in size with greater than 50% respiratory variability, suggesting right atrial pressure of 3 mmHg.  7. Cannot exclude a small PFO. FINDINGS  Left Ventricle: Left ventricular ejection fraction, by estimation, is 60 to 65%. The  left ventricle has normal function. The left ventricle has no regional wall motion abnormalities. Strain was performed and the global longitudinal strain is indeterminate. The left ventricular internal cavity size was normal in size. There is mild left ventricular hypertrophy. Left ventricular diastolic parameters were normal. Right Ventricle: The right ventricular size is normal. No increase in right ventricular wall thickness. Right ventricular systolic function is normal. Left Atrium: Left atrial size was mildly dilated. Right Atrium: Right atrial size was normal in size. Pericardium: There is no evidence of pericardial effusion. Mitral Valve: The mitral valve is abnormal. There is mild thickening of the mitral valve leaflet(s). There is mild calcification of the mitral valve leaflet(s). Trivial mitral valve regurgitation. No evidence of mitral valve stenosis. Tricuspid Valve: The tricuspid valve is normal in structure. Tricuspid valve regurgitation is mild . No evidence of tricuspid stenosis. Aortic Valve: Mean gradient 5 peak 10 mmhg but AVA 2.2 cm2. The aortic valve is tricuspid. There is moderate calcification of the aortic valve. There is moderate thickening of the aortic valve. Aortic valve regurgitation is not visualized. Aortic valve sclerosis is present, with no evidence of aortic valve stenosis. Aortic valve mean gradient measures 5.0 mmHg. Aortic valve peak gradient measures 9.5 mmHg. Aortic valve area, by VTI measures 2.25 cm. Pulmonic Valve: The pulmonic valve was normal in structure. Pulmonic valve regurgitation is not visualized. No evidence of pulmonic stenosis. Aorta: The aortic root is normal in size and structure. Venous: The inferior vena cava is normal in size with greater than 50% respiratory variability, suggesting right atrial pressure of 3 mmHg. IAS/Shunts: Cannot exclude a small PFO. Additional Comments: 3D was performed not requiring image post processing on an independent workstation  and was indeterminate.  LEFT VENTRICLE PLAX 2D LVIDd:         4.10 cm   Diastology LVIDs:         2.70 cm   LV e' medial:    6.96 cm/s LV PW:         1.00 cm   LV E/e' medial:  10.8 LV IVS:        1.20 cm   LV e' lateral:   11.70 cm/s LVOT diam:     1.80 cm   LV E/e' lateral: 6.4 LV SV:         62 LV SV Index:   34 LVOT Area:     2.54 cm  RIGHT VENTRICLE RV S prime:     19.70 cm/s TAPSE (M-mode): 2.6 cm LEFT ATRIUM             Index        RIGHT ATRIUM           Index LA diam:        4.00 cm 2.22 cm/m   RA Area:     13.30 cm LA Vol (A2C):   50.0 ml 27.78 ml/m  RA Volume:  30.10 ml  16.72 ml/m LA Vol (A4C):   69.2 ml 38.45 ml/m LA Biplane Vol: 59.1 ml 32.83 ml/m  AORTIC VALVE AV Area (Vmax):    2.07 cm AV Area (Vmean):   2.24 cm AV Area (VTI):     2.25 cm AV Vmax:           154.00 cm/s AV Vmean:          107.000 cm/s AV VTI:            0.276 m AV Peak Grad:      9.5 mmHg AV Mean Grad:      5.0 mmHg LVOT Vmax:         125.50 cm/s LVOT Vmean:        94.200 cm/s LVOT VTI:          0.244 m LVOT/AV VTI ratio: 0.88  AORTA Ao Root diam: 3.10 cm MITRAL VALVE MV Area (PHT): 3.48 cm    SHUNTS MV Decel Time: 218 msec    Systemic VTI:  0.24 m MV E velocity: 75.20 cm/s  Systemic Diam: 1.80 cm MV A velocity: 58.50 cm/s MV E/A ratio:  1.29 Maude Emmer MD Electronically signed by Maude Emmer MD Signature Date/Time: 08/14/2023/1:10:05 PM    Final    DG Chest Portable 1 View Result Date: 08/14/2023 CLINICAL DATA:  Respiratory distress EXAM: PORTABLE CHEST 1 VIEW COMPARISON:  06/09/2023, 07/16/2023 FINDINGS: Cardiac shadow is within normal limits. Near complete consolidation of the right upper lobe is noted. Left perihilar opacity is noted as well. IMPRESSION: Increasing right upper lobe consolidation consistent with focal infiltrate. Left perihilar opacity and left retrocardiac opacity. Electronically Signed   By: Oneil Devonshire M.D.   On: 08/14/2023 02:34     Assessment & Plan:   1. Pneumonia of right upper lobe due  to infectious organism (Primary) - DG Chest 2 View; Future - Ambulatory Referral for DME - Pulmonary Function Test; Future  2. Acute on chronic respiratory failure with hypoxia (HCC) - AMB REFERRAL FOR DME  Assessment and Plan    Chronic bronchitis Former smoker quit in 1974. Recurrent episodes with recent exacerbation in May, presenting with chest congestion, fever, and dyspnea. Previously managed with Augmentin , prednisone , and guaifenesin . Suspected chronic bronchitis; COPD not confirmed pending pulmonary function testing. - Order pulmonary function test to assess for obstructive lung disease - Continue Symbicort 160mcg inhaler twice daily - Use ipratropium-albuterol  nebulizer as needed for acute symptoms  Recurrent pneumonia Recent hospitalization in June for pneumonia. Symptoms have improved. No current cough, sputum production, or significant dyspnea. Chest x-ray required to confirm resolution of right upper lobe pneumonia. - Order chest x-ray to ensure resolution of previous right upper lobe pneumonia  Acute on chronic respiratory failure PSG in September 2024 negative for OSA, however, oxygen  levels did drop into the 80s. Currently using nocturnal oxygen  therapy. Recently qualified for daytime oxygen  during hospitalization in June. O2 levels stayed >92% on room air during walk test today. Issues with empty oxygen  tank; home concentrator available. Interested in [employer], jewellery.  - Continue 2L oxygen  at night and with exertion to maintain O2 >88-90%  - Best fit eval through DME for portable oxygen  concentrator - Ensure delivery of new oxygen  tanks  Pulmonary nodules Small pulmonary nodules (2-3 mm) on CT scan, stable in size, not concerning for malignancy. No evidence of interstitial lung disease or pulmonary fibrosis.  Scoliosis Long-standing scoliosis since childhood, no significant impact on current respiratory function. Potential restriction of lung  function if kyphosis  or scoliosis progresses.  Periodic limb movement - Continue Lyrica  at bedtime   Almarie LELON Ferrari, NP 08/30/2023

## 2023-08-31 ENCOUNTER — Other Ambulatory Visit: Payer: Self-pay

## 2023-08-31 ENCOUNTER — Ambulatory Visit: Payer: Self-pay | Admitting: Primary Care

## 2023-08-31 DIAGNOSIS — J189 Pneumonia, unspecified organism: Secondary | ICD-10-CM

## 2023-08-31 NOTE — Transitions of Care (Post Inpatient/ED Visit) (Signed)
 Transition of Care week 2  Visit Note  08/31/2023  Name: Diane Cohen MRN: [id number]          DOB: 05-23-1945  Situation: Patient enrolled in [facility] 30-day program. Visit completed with patient by telephone.   Background:   Initial Transition Care Management Follow-up Telephone Call    Past Medical History:  Diagnosis Date   Anxiety    Atrial fibrillation [facility])    Atrial fibrillation ablation in 2019 - Dr. Kelsie   Chronic diastolic heart failure (HCC)    Depression    Essential hypertension    History of TIA (transient ischemic attack)    Hyperlipidemia    Hypothyroidism    Pneumonia     Assessment: Patient Reported Symptoms: Cognitive Cognitive Status: Alert and oriented to person, place, and time, Normal speech and language skills      Neurological Neurological Review of Symptoms: No symptoms reported    HEENT HEENT Symptoms Reported: No symptoms reported      Cardiovascular Cardiovascular Symptoms Reported: No symptoms reported    Respiratory Respiratory Symptoms Reported: No symptoms reported Additional Respiratory Details: PFT tested ordered by pulmonology Respiratory Conditions: COPD Respiratory Self-Management Outcome: 4 (good) Respiratory Comment: Now uses oxygen  @ 2liters at night buyt checks pulse ox during the day to keep sats above 88-90%  Endocrine Patient reports the following symptoms related to hypoglycemia or hyperglycemia : No symptoms reported    Gastrointestinal Gastrointestinal Symptoms Reported: No symptoms reported      Genitourinary Genitourinary Symptoms Reported: No symptoms reported    Integumentary Integumentary Symptoms Reported: No symptoms reported    Musculoskeletal Musculoskelatal Symptoms Reviewed: Unsteady gait Additional Musculoskeletal Details: Reports she feels shaky at times. Home PT ordered        Psychosocial Psychosocial Symptoms Reported: No symptoms reported         There were no vitals filed for this  visit.  Medications Reviewed Today     Reviewed by Heather Streeper, RN (Case Manager) on 08/31/23 at 1209  Med List Status: <None>   Medication Order Taking? Sig Documenting Provider Last Dose Status Informant  amiodarone  (PACERONE ) 200 MG tablet [id number] Yes TAKE 1 TABLET(200 MG) BY MOUTH DAILY Debera Jayson MATSU, MD  Active Pharmacy Records, Self  atorvastatin  (LIPITOR) 20 MG tablet [id number] Yes Take 20 mg by mouth daily. [provider]  Active Pharmacy Records, Self  benzonatate (TESSALON) 200 MG capsule [id number] Yes Take 200 mg by mouth 2 (two) times daily as needed. [provider]  Active Pharmacy Records, Self  dextromethorphan -guaiFENesin  (MUCINEX  DM) 30-600 MG 12hr tablet [id number] Yes Take 1 tablet by mouth 2 (two) times daily. [provider]  Active   diltiazem  (CARDIZEM  CD) 180 MG 24 hr capsule [id number] Yes TAKE 1 CAPSULE(180 MG) BY MOUTH DAILY Debera Jayson MATSU, MD  Active Pharmacy Records, Self  DULoxetine  (CYMBALTA ) 60 MG capsule [id number] Yes Take 1 capsule (60 mg total) by mouth daily. Ricky Fines, MD  Active Pharmacy Records, Self  FARXIGA  10 MG TABS tablet [id number] Yes TAKE 1 TABLET(10 MG) BY MOUTH DAILY BEFORE BREAKFAST Debera Jayson MATSU, MD  Active Pharmacy Records, Self  guaiFENesin  [facility]) 100 MG/5ML liquid [id number]  Take 10 mLs by mouth every 6 (six) hours as needed for cough or to loosen phlegm.  Patient not taking: Reported on 08/31/2023   Gilmer Etta PARAS, NP  Active Pharmacy Records, Self  hydrALAZINE  (APRESOLINE ) 25 MG tablet [id number] Yes Take 1 tablet (25 mg total) by mouth 2 (two) times  daily. Evonnie Lenis, MD  Active   ipratropium (ATROVENT ) 0.06 % nasal spray [id number] Yes Place 2 sprays into both nostrils 3 (three) times daily. Evonnie Lenis, MD  Active Pharmacy Records, Self  ipratropium-albuterol  (DUONEB) 0.5-2.5 (3) MG/3ML SOLN [id number] Yes Take 3 mLs by nebulization every 6 (six) hours as needed. Evonnie Lenis, MD   Active Pharmacy Records, Self  Iron-FA-B Cmp-C-Biot-Probiotic (FUSION PLUS) CAPS [id number] Yes Take 1 capsule by mouth daily. [provider]  Active Pharmacy Records, Self  levothyroxine  (SYNTHROID ) 150 MCG tablet [id number] Yes Take 150 mcg by mouth daily. [provider]  Active Pharmacy Records, Self  metoprolol  succinate (TOPROL  XL) 25 MG 24 hr tablet [id number] Yes Take 1 tablet (25 mg total) by mouth daily. Debera Jayson MATSU, MD  Active Pharmacy Records, Self  potassium chloride  SA (KLOR-CON  M) 20 MEQ tablet [id number] Yes TAKE 1 TABLET(20 MEQ) BY MOUTH DAILY Debera Jayson MATSU, MD  Active Pharmacy Records, Self  predniSONE  (DELTASONE ) 10 MG tablet [id number] Yes Take 5 tablets (50 mg total) by mouth daily with breakfast. And decrease by one tablet daily Tat, David, MD  Active   pregabalin  (LYRICA ) 75 MG capsule [id number] Yes Take 1 capsule (75 mg total) by mouth at bedtime. For restless leg symptoms Hope Almarie ORN, NP  Active Pharmacy Records, Self  Rivaroxaban  (XARELTO ) 15 MG TABS tablet [id number] Yes TAKE 1 TABLET(15 MG) BY MOUTH DAILY WITH SUPPER Debera Jayson MATSU, MD  Active Pharmacy Records, Self  [facility] 160-4.5 MCG/ACT inhaler [id number] Yes Inhale 2 puffs into the lungs 2 (two) times daily. [provider]  Active Pharmacy Records, Self  torsemide  (DEMADEX ) 20 MG tablet [id number] Yes Take 2 tablets (40 mg total) by mouth daily. Evonnie Lenis, MD  Active             Goals Addressed             This Visit's Progress    VBCI Transitions of Care (TOC) Care Plan       Problems:  Recent Hospitalization for treatment of COPD and Pneumonia Knowledge Deficit Related to Pneumonia  Goal:  Over the next 30 days, the patient will not experience hospital readmission  Interventions:  Transitions of Care: Doctor Visits  - discussed the importance of doctor visits Reviewed Signs and symptoms of infection  COPD Interventions: Advised patient to self  assesses COPD action plan zone and make appointment with provider if in the yellow zone for 48 hours without improvement Use of home oxygen  Reiterated with patient use of Symbicort as prescribed  Reviewed pulmonologist appointment 08/30/23 Advised to keep Oxygen  saturation 88-90%  Patient Self Care Activities:  Attend all scheduled provider appointments Call provider office for new concerns or questions  Notify RN Care Manager of [facility] call rescheduling needs Participate in Transition of Care Program/Attend [facility] scheduled calls Take medications as prescribed   follow rescue plan if symptoms flare-up Attend Pulmonary Function Test 09/09/23  Plan:  Telephone follow up appointment with care management team member scheduled for:  09/07/23 @ 1200 pm         Recommendation:   Continue Current Plan of Care  Follow Up Plan:   Telephone follow-up in 1 week   Dorean Hiebert DOROTHA Seeds RN, MSN [place]  [facility], [facility] Health RN Care Manager Direct Dial: [phone]  Fax: [phone] Website: [email or web address]

## 2023-08-31 NOTE — Progress Notes (Signed)
 Needs follow-up in another 4 weeks to ensure complete resolution, please order CXR re: pneumonia

## 2023-08-31 NOTE — Patient Instructions (Signed)
 Visit Information  Thank you for taking time to visit with me today. Please don't hesitate to contact me if I can be of assistance to you before our next scheduled telephone appointment.  Our next appointment is by telephone on 09/07/23 at 1200 pm  Following is a copy of your care plan:   Goals Addressed             This Visit's Progress    VBCI Transitions of Care (TOC) Care Plan       Problems:  Recent Hospitalization for treatment of COPD and Pneumonia Knowledge Deficit Related to Pneumonia  Goal:  Over the next 30 days, the patient will not experience hospital readmission  Interventions:  Transitions of Care: Doctor Visits  - discussed the importance of doctor visits Reviewed Signs and symptoms of infection  COPD Interventions: Advised patient to self assesses COPD action plan zone and make appointment with provider if in the yellow zone for 48 hours without improvement Use of home oxygen  Reiterated with patient use of Symbicort as prescribed  Reviewed pulmonologist appointment 08/30/23 Advised to keep Oxygen  saturation 88-90%  Patient Self Care Activities:  Attend all scheduled provider appointments Call provider office for new concerns or questions  Notify RN Care Manager of [facility] call rescheduling needs Participate in Transition of Care Program/Attend TOC scheduled calls Take medications as prescribed   follow rescue plan if symptoms flare-up Attend Pulmonary Function Test 09/09/23  Plan:  Telephone follow up appointment with care management team member scheduled for:  09/07/23 @ 1200 pm         Pneumonia: Contact physician if: You have a new or higher fever. You are coughing more deeply or more often. You are not getting better after 2 days (48 hours). You do not get better as expected.  Patient verbalizes understanding of instructions and care plan provided today and agrees to view in MyChart. Active MyChart status and patient understanding of how to access  instructions and care plan via MyChart confirmed with patient.     The patient has been provided with contact information for the care management team and has been advised to call with any health related questions or concerns.   Please call the care guide team at [phone] if you need to cancel or reschedule your appointment.   Please call the Suicide and Crisis Lifeline: 988 if you are experiencing a Mental Health or Behavioral Health Crisis or need someone to talk to.  Etheridge Geil J. Itamar Mcgowan RN, MSN [facility], [facility] Health RN Care Manager Direct Dial: [phone]  Fax: [phone] Website: [email or web address]

## 2023-08-31 NOTE — Progress Notes (Signed)
 Please let patient know CXR showed continued improvement in right sided pneumonia, other areas have resolved

## 2023-09-01 NOTE — Progress Notes (Signed)
 I called and spoke to pt. Pt informed of [name]'s note and verbalized understanding. I will place CXR order and route a message to the front desk to schedule pt in 4 weeks with [name] with the CXR before the appointment.

## 2023-09-03 DIAGNOSIS — I5033 Acute on chronic diastolic (congestive) heart failure: Secondary | ICD-10-CM | POA: Diagnosis not present

## 2023-09-03 DIAGNOSIS — D631 Anemia in chronic kidney disease: Secondary | ICD-10-CM | POA: Diagnosis not present

## 2023-09-03 DIAGNOSIS — J181 Lobar pneumonia, unspecified organism: Secondary | ICD-10-CM | POA: Diagnosis not present

## 2023-09-03 DIAGNOSIS — J441 Chronic obstructive pulmonary disease with (acute) exacerbation: Secondary | ICD-10-CM | POA: Diagnosis not present

## 2023-09-03 DIAGNOSIS — N1831 Chronic kidney disease, stage 3a: Secondary | ICD-10-CM | POA: Diagnosis not present

## 2023-09-03 DIAGNOSIS — J9621 Acute and chronic respiratory failure with hypoxia: Secondary | ICD-10-CM | POA: Diagnosis not present

## 2023-09-03 DIAGNOSIS — A419 Sepsis, unspecified organism: Secondary | ICD-10-CM | POA: Diagnosis not present

## 2023-09-03 DIAGNOSIS — J440 Chronic obstructive pulmonary disease with acute lower respiratory infection: Secondary | ICD-10-CM | POA: Diagnosis not present

## 2023-09-03 DIAGNOSIS — I130 Hypertensive heart and chronic kidney disease with heart failure and stage 1 through stage 4 chronic kidney disease, or unspecified chronic kidney disease: Secondary | ICD-10-CM | POA: Diagnosis not present

## 2023-09-03 NOTE — Telephone Encounter (Signed)
 Attempted to call patient. Left voicemail for patient to give our office a call back to get scheduled.

## 2023-09-06 ENCOUNTER — Telehealth: Payer: Self-pay

## 2023-09-06 ENCOUNTER — Other Ambulatory Visit: Payer: Self-pay | Admitting: Cardiology

## 2023-09-06 DIAGNOSIS — I4819 Other persistent atrial fibrillation: Secondary | ICD-10-CM

## 2023-09-06 NOTE — Telephone Encounter (Signed)
 Per Chris Pavero, PharmD dose should be increased to 20mg  daily. I called to make pt aware, no answer. Left message on voicemail with call back number if she has questions or concerns. Refill sent.

## 2023-09-06 NOTE — Telephone Encounter (Signed)
 Copied from CRM [id number]. Topic: Clinical - Medical Advice >> Sep 03, 2023  2:59 PM Diane Cohen wrote: Reason for CRM: Patient was told from her discharge papers if she gains weight overnight to please let her provider know - she has gained 2 lbs overnight.   Callback number: [phone]  Spoke with pt she stated she gained 2 lbs over night and the following morning she had lost it that she was back to her normal weight, she mention that [name] told her to call us  and let us  know if she gained any weight overnight.

## 2023-09-06 NOTE — Telephone Encounter (Signed)
 Continue Torsemide  as directed, no changes  Continue to monitor daily weights Would notify cardiologist

## 2023-09-06 NOTE — Telephone Encounter (Signed)
 Prescription refill request for Xarelto  received.  Indication: Afib  Last office visit: 04/19/23 Berna)    Weight: 73.5kg Age:78 Scr: 1.04 (08/20/23)  CrCl: 51.72ml/min  Per dosing criteria, dose not appropriate. Will forward to PharmD.

## 2023-09-06 NOTE — Telephone Encounter (Signed)
 Called and spoke with Diane Cohen regarding [name]'s note. Pt verbalized understanding and had no concerns. Nothing further needed.

## 2023-09-07 ENCOUNTER — Other Ambulatory Visit: Payer: Self-pay

## 2023-09-07 NOTE — Patient Instructions (Signed)
 Visit Information  Thank you for taking time to visit with me today. Please don't hesitate to contact me if I can be of assistance to you before our next scheduled telephone appointment.  Our next appointment is by telephone on 09/14/23 at 1200 pm  Following is a copy of your care plan:   Goals Addressed             This Visit's Progress    VBCI Transitions of Care (TOC) Care Plan       Problems:  Recent Hospitalization for treatment of COPD and Pneumonia Knowledge Deficit Related to Pneumonia  Goal:  Over the next 30 days, the patient will not experience hospital readmission  Interventions:  Transitions of Care: Doctor Visits  - discussed the importance of doctor visits Reviewed Signs and symptoms of infection  COPD Interventions: Advised patient to self assesses COPD action plan zone and make appointment with provider if in the yellow zone for 48 hours without improvement Use of home oxygen  Patient has not seen PCP, Reviewed importance of seeing PCP Advised to keep Oxygen  saturation 88-90%  Patient Self Care Activities:  Attend all scheduled provider appointments Call provider office for new concerns or questions  Notify RN Care Manager of [facility] call rescheduling needs Participate in Transition of Care Program/Attend TOC scheduled calls Take medications as prescribed   follow rescue plan if symptoms flare-up Attend Pulmonary Function Test 09/09/23 Call PCP for appointment  Plan:  Telephone follow up appointment with care management team member scheduled for:  09/14/23 @ 1200 pm        Patient verbalizes understanding of instructions and care plan provided today and agrees to view in MyChart. Active MyChart status and patient understanding of how to access instructions and care plan via MyChart confirmed with patient.     The patient has been provided with contact information for the care management team and has been advised to call with any health related questions or  concerns.   Please call the care guide team at [phone] if you need to cancel or reschedule your appointment.   Please call the Suicide and Crisis Lifeline: 988 if you are experiencing a Mental Health or Behavioral Health Crisis or need someone to talk to.  Deddrick Saindon J. Trumaine Wimer RN, MSN [facility], [facility] Health RN Care Manager Direct Dial: [phone]  Fax: [phone] Website: [email or web address]

## 2023-09-07 NOTE — Transitions of Care (Post Inpatient/ED Visit) (Signed)
 Transition of Care week 3  Visit Note  09/07/2023  Name: Diane Cohen MRN: [id number]          DOB: April 06, 1945  Situation: Patient enrolled in [facility] 30-day program. Visit completed with patient by telephone.   Background:   Initial Transition Care Management Follow-up Telephone Call    Past Medical History:  Diagnosis Date   Anxiety    Atrial fibrillation [facility])    Atrial fibrillation ablation in 2019 - Dr. Kelsie   Chronic diastolic heart failure (HCC)    Depression    Essential hypertension    History of TIA (transient ischemic attack)    Hyperlipidemia    Hypothyroidism    Pneumonia     Assessment: Patient Reported Symptoms: Cognitive Cognitive Status: Alert and oriented to person, place, and time, Normal speech and language skills      Neurological Neurological Review of Symptoms: No symptoms reported    HEENT HEENT Symptoms Reported: No symptoms reported      Cardiovascular Cardiovascular Symptoms Reported: No symptoms reported    Respiratory Respiratory Symptoms Reported: Shortness of breath Other Respiratory Symptoms: Reports some shortness of breath with exertion. Wears oxygen  as needed.  Sats staying above 90% per patient Additional Respiratory Details: PFT 7-3 Respiratory Management Strategies: Oxygen  therapy Respiratory Self-Management Outcome: 3 (uncertain)  Endocrine Endocrine Symptoms Reported: No symptoms reported    Gastrointestinal Gastrointestinal Symptoms Reported: No symptoms reported      Genitourinary Genitourinary Symptoms Reported: No symptoms reported    Integumentary Integumentary Symptoms Reported: No symptoms reported    Musculoskeletal Musculoskelatal Symptoms Reviewed: Unsteady gait Additional Musculoskeletal Details: Reports feeling shaky at times.  PT ordered        Psychosocial Psychosocial Symptoms Reported: No symptoms reported         There were no vitals filed for this visit.  Medications Reviewed Today      Reviewed by Shealynn Saulnier, RN (Case Manager) on 09/07/23 at 1216  Med List Status: <None>   Medication Order Taking? Sig Documenting Provider Last Dose Status Informant  amiodarone  (PACERONE ) 200 MG tablet [id number] Yes TAKE 1 TABLET(200 MG) BY MOUTH DAILY Debera Jayson MATSU, MD  Active Pharmacy Records, Self  atorvastatin  (LIPITOR) 20 MG tablet [id number] Yes Take 20 mg by mouth daily. [provider]  Active Pharmacy Records, Self  benzonatate (TESSALON) 200 MG capsule [id number] Yes Take 200 mg by mouth 2 (two) times daily as needed. [provider]  Active Pharmacy Records, Self  dextromethorphan -guaiFENesin  (MUCINEX  DM) 30-600 MG 12hr tablet [id number] Yes Take 1 tablet by mouth 2 (two) times daily. [provider]  Active   diltiazem  (CARDIZEM  CD) 180 MG 24 hr capsule [id number] Yes TAKE 1 CAPSULE(180 MG) BY MOUTH DAILY Debera Jayson MATSU, MD  Active Pharmacy Records, Self  DULoxetine  (CYMBALTA ) 60 MG capsule [id number] Yes Take 1 capsule (60 mg total) by mouth daily. Ricky Fines, MD  Active Pharmacy Records, Self  FARXIGA  10 MG TABS tablet [id number] Yes TAKE 1 TABLET(10 MG) BY MOUTH DAILY BEFORE BREAKFAST Debera Jayson MATSU, MD  Active Pharmacy Records, Self  guaiFENesin  [facility]) 100 MG/5ML liquid [id number]  Take 10 mLs by mouth every 6 (six) hours as needed for cough or to loosen phlegm.  Patient not taking: Reported on 09/07/2023   Gilmer Etta PARAS, NP  Active Pharmacy Records, Self  hydrALAZINE  (APRESOLINE ) 25 MG tablet [id number] Yes Take 1 tablet (25 mg total) by mouth 2 (two) times daily. Evonnie Lenis, MD  Active   ipratropium (ATROVENT )  0.06 % nasal spray [id number] Yes Place 2 sprays into both nostrils 3 (three) times daily. Evonnie Lenis, MD  Active Pharmacy Records, Self  ipratropium-albuterol  (DUONEB) 0.5-2.5 (3) MG/3ML SOLN [id number] Yes Take 3 mLs by nebulization every 6 (six) hours as needed. Evonnie Lenis, MD  Active Pharmacy Records, Self  Iron-FA-B  Cmp-C-Biot-Probiotic (FUSION PLUS) CAPS [id number] Yes Take 1 capsule by mouth daily. [provider]  Active Pharmacy Records, Self  levothyroxine  (SYNTHROID ) 150 MCG tablet [id number] Yes Take 150 mcg by mouth daily. [provider]  Active Pharmacy Records, Self  metoprolol  succinate (TOPROL  XL) 25 MG 24 hr tablet [id number] Yes Take 1 tablet (25 mg total) by mouth daily. Debera Jayson MATSU, MD  Active Pharmacy Records, Self  potassium chloride  SA (KLOR-CON  M) 20 MEQ tablet [id number] Yes TAKE 1 TABLET(20 MEQ) BY MOUTH DAILY Debera Jayson MATSU, MD  Active Pharmacy Records, Self  predniSONE  (DELTASONE ) 10 MG tablet [id number]  Take 5 tablets (50 mg total) by mouth daily with breakfast. And decrease by one tablet daily  Patient not taking: Reported on 09/07/2023   Evonnie Lenis, MD  Active   pregabalin  (LYRICA ) 75 MG capsule [id number] Yes Take 1 capsule (75 mg total) by mouth at bedtime. For restless leg symptoms Hope Almarie ORN, NP  Active Pharmacy Records, Self  rivaroxaban  (XARELTO ) 20 MG TABS tablet [id number] Yes Take 1 tablet (20 mg total) by mouth daily with supper. Debera Jayson MATSU, MD  Active   [facility] 160-4.5 MCG/ACT inhaler [id number] Yes Inhale 2 puffs into the lungs 2 (two) times daily. [provider]  Active Pharmacy Records, Self  torsemide  (DEMADEX ) 20 MG tablet [id number] Yes Take 2 tablets (40 mg total) by mouth daily. Evonnie Lenis, MD  Active             Recommendation:   PCP Follow-up Continue Current Plan of Care  Follow Up Plan:   Telephone follow-up in 1 week   Amardeep Beckers J. Dyke Weible RN, MSN [facility], [facility] Health RN Care Manager Direct Dial: [phone]  Fax: [phone] Website: [email or web address]

## 2023-09-09 ENCOUNTER — Other Ambulatory Visit (HOSPITAL_COMMUNITY): Payer: Self-pay | Admitting: Family Medicine

## 2023-09-09 ENCOUNTER — Ambulatory Visit (HOSPITAL_COMMUNITY)
Admission: RE | Admit: 2023-09-09 | Discharge: 2023-09-09 | Disposition: A | Discharge status: Home / Self Care | Source: Ambulatory Visit | Attending: Primary Care | Admitting: Primary Care

## 2023-09-09 DIAGNOSIS — Z79899 Other long term (current) drug therapy: Secondary | ICD-10-CM | POA: Diagnosis not present

## 2023-09-09 DIAGNOSIS — J309 Allergic rhinitis, unspecified: Secondary | ICD-10-CM | POA: Diagnosis not present

## 2023-09-09 DIAGNOSIS — G8929 Other chronic pain: Secondary | ICD-10-CM

## 2023-09-09 DIAGNOSIS — I509 Heart failure, unspecified: Secondary | ICD-10-CM | POA: Diagnosis not present

## 2023-09-09 DIAGNOSIS — J449 Chronic obstructive pulmonary disease, unspecified: Secondary | ICD-10-CM | POA: Diagnosis not present

## 2023-09-09 DIAGNOSIS — I110 Hypertensive heart disease with heart failure: Secondary | ICD-10-CM | POA: Diagnosis not present

## 2023-09-09 DIAGNOSIS — M25511 Pain in right shoulder: Secondary | ICD-10-CM

## 2023-09-09 DIAGNOSIS — J189 Pneumonia, unspecified organism: Secondary | ICD-10-CM | POA: Diagnosis not present

## 2023-09-09 DIAGNOSIS — J441 Chronic obstructive pulmonary disease with (acute) exacerbation: Secondary | ICD-10-CM | POA: Diagnosis not present

## 2023-09-09 DIAGNOSIS — I10 Essential (primary) hypertension: Secondary | ICD-10-CM | POA: Diagnosis not present

## 2023-09-09 LAB — PULMONARY FUNCTION TEST
DL/VA % pred: 94 %
DL/VA: 4 ml/min/mmHg/L
DLCO unc % pred: 68 %
DLCO unc: 11.5 ml/min/mmHg
FEF 25-75 Post: 1.42 L/s
FEF 25-75 Pre: 1.65 L/s
FEF2575-%Change-Post: -13 %
FEF2575-%Pred-Post: 105 %
FEF2575-%Pred-Pre: 123 %
FEV1-%Change-Post: -3 %
FEV1-%Pred-Post: 80 %
FEV1-%Pred-Pre: 83 %
FEV1-Post: 1.39 L
FEV1-Pre: 1.44 L
FEV1FVC-%Change-Post: 3 %
FEV1FVC-%Pred-Pre: 111 %
FEV6-%Change-Post: -5 %
FEV6-%Pred-Post: 74 %
FEV6-%Pred-Pre: 78 %
FEV6-Post: 1.63 L
FEV6-Pre: 1.74 L
FEV6FVC-%Pred-Post: 105 %
FEV6FVC-%Pred-Pre: 105 %
FVC-%Change-Post: -5 %
FVC-%Pred-Post: 70 %
FVC-%Pred-Pre: 74 %
FVC-Post: 1.63 L
FVC-Pre: 1.74 L
Post FEV1/FVC ratio: 85 %
Post FEV6/FVC ratio: 100 %
Pre FEV1/FVC ratio: 83 %
Pre FEV6/FVC Ratio: 100 %
RV % pred: 61 %
RV: 1.35 L
TLC % pred: 70 %
TLC: 3.26 L

## 2023-09-09 MED ORDER — ALBUTEROL SULFATE (2.5 MG/3ML) 0.083% IN NEBU
2.5000 mg | INHALATION_SOLUTION | Freq: Once | RESPIRATORY_TRACT | Status: AC
  Administered 2023-09-09: 2.5 mg via RESPIRATORY_TRACT

## 2023-09-09 NOTE — Telephone Encounter (Signed)
 Okay to double book in [place]? First available would be 8/5- is this okay

## 2023-09-11 DIAGNOSIS — J9621 Acute and chronic respiratory failure with hypoxia: Secondary | ICD-10-CM | POA: Diagnosis not present

## 2023-09-11 DIAGNOSIS — D631 Anemia in chronic kidney disease: Secondary | ICD-10-CM | POA: Diagnosis not present

## 2023-09-11 DIAGNOSIS — I5033 Acute on chronic diastolic (congestive) heart failure: Secondary | ICD-10-CM | POA: Diagnosis not present

## 2023-09-11 DIAGNOSIS — J441 Chronic obstructive pulmonary disease with (acute) exacerbation: Secondary | ICD-10-CM | POA: Diagnosis not present

## 2023-09-11 DIAGNOSIS — J440 Chronic obstructive pulmonary disease with acute lower respiratory infection: Secondary | ICD-10-CM | POA: Diagnosis not present

## 2023-09-11 DIAGNOSIS — A419 Sepsis, unspecified organism: Secondary | ICD-10-CM | POA: Diagnosis not present

## 2023-09-11 DIAGNOSIS — N1831 Chronic kidney disease, stage 3a: Secondary | ICD-10-CM | POA: Diagnosis not present

## 2023-09-11 DIAGNOSIS — J181 Lobar pneumonia, unspecified organism: Secondary | ICD-10-CM | POA: Diagnosis not present

## 2023-09-11 DIAGNOSIS — I130 Hypertensive heart and chronic kidney disease with heart failure and stage 1 through stage 4 chronic kidney disease, or unspecified chronic kidney disease: Secondary | ICD-10-CM | POA: Diagnosis not present

## 2023-09-14 ENCOUNTER — Telehealth: Payer: Self-pay

## 2023-09-15 ENCOUNTER — Telehealth: Payer: Self-pay

## 2023-09-15 DIAGNOSIS — I130 Hypertensive heart and chronic kidney disease with heart failure and stage 1 through stage 4 chronic kidney disease, or unspecified chronic kidney disease: Secondary | ICD-10-CM | POA: Diagnosis not present

## 2023-09-15 DIAGNOSIS — J181 Lobar pneumonia, unspecified organism: Secondary | ICD-10-CM | POA: Diagnosis not present

## 2023-09-15 DIAGNOSIS — I5033 Acute on chronic diastolic (congestive) heart failure: Secondary | ICD-10-CM | POA: Diagnosis not present

## 2023-09-15 DIAGNOSIS — J440 Chronic obstructive pulmonary disease with acute lower respiratory infection: Secondary | ICD-10-CM | POA: Diagnosis not present

## 2023-09-15 DIAGNOSIS — J9621 Acute and chronic respiratory failure with hypoxia: Secondary | ICD-10-CM | POA: Diagnosis not present

## 2023-09-15 DIAGNOSIS — A419 Sepsis, unspecified organism: Secondary | ICD-10-CM | POA: Diagnosis not present

## 2023-09-15 DIAGNOSIS — D631 Anemia in chronic kidney disease: Secondary | ICD-10-CM | POA: Diagnosis not present

## 2023-09-15 DIAGNOSIS — J441 Chronic obstructive pulmonary disease with (acute) exacerbation: Secondary | ICD-10-CM | POA: Diagnosis not present

## 2023-09-15 DIAGNOSIS — N1831 Chronic kidney disease, stage 3a: Secondary | ICD-10-CM | POA: Diagnosis not present

## 2023-09-15 NOTE — Patient Instructions (Signed)
 Visit Information  Thank you for taking time to visit with me today. Please don't hesitate to contact me if I can be of assistance to you before our next scheduled telephone appointment.  Our next appointment is by telephone on 09/23/23 at 1200 pm  Following is a copy of your care plan:   Goals Addressed             This Visit's Progress    VBCI Transitions of Care (TOC) Care Plan       Problems:  Recent Hospitalization for treatment of COPD and Pneumonia Knowledge Deficit Related to Pneumonia  Goal:  Over the next 30 days, the patient will not experience hospital readmission  Interventions:   09/15/23 Patient reports she is doing well. PFT completed. Patient will call pulmonology for follow up.  Patient denies problems with breathing.  Home Therapy continues.  Transitions of Care: Doctor Visits  - discussed the importance of doctor visits Reviewed Signs and symptoms of infection  COPD Interventions: Advised patient to self assesses COPD action plan zone and make appointment with provider if in the yellow zone for 48 hours without improvement Use of home oxygen  Patient has not seen PCP, Reviewed importance of seeing PCP Advised to keep Oxygen  saturation 88-90%  Patient Self Care Activities:  Attend all scheduled provider appointments Call provider office for new concerns or questions  Notify RN Care Manager of TOC call rescheduling needs Participate in Transition of Care Program/Attend TOC scheduled calls Take medications as prescribed   follow rescue plan if symptoms flare-up Call pulmonologist for follow up Call PCP for appointment  Plan:  Telephone follow up appointment with care management team member scheduled for:  09/23/23 @ 1200 pm        Patient verbalizes understanding of instructions and care plan provided today and agrees to view in MyChart. Active MyChart status and patient understanding of how to access instructions and care plan via MyChart confirmed  with patient.     The patient has been provided with contact information for the care management team and has been advised to call with any health related questions or concerns.   Please call the care guide team at [phone] if you need to cancel or reschedule your appointment.   Please call the Suicide and Crisis Lifeline: 988 if you are experiencing a Mental Health or Behavioral Health Crisis or need someone to talk to.  Carlia Bomkamp J. Praise Dolecki RN, MSN [facility], [facility] RN Care Manager Direct Dial: [phone]  Fax: [phone] Website: [email or web address]

## 2023-09-15 NOTE — Progress Notes (Signed)
 Patient scheduled on 8/5 at 10am with Landry Ferrari in [place] in cancellation slot. Aware to go to [facility] prior to appointment to get CXR

## 2023-09-15 NOTE — Progress Notes (Signed)
 Yes

## 2023-09-15 NOTE — Transitions of Care (Post Inpatient/ED Visit) (Signed)
 Transition of Care week 4  Visit Note  09/15/2023  Name: Diane Cohen MRN: [id number]          DOB: [birth year]  Situation: Patient enrolled in [facility] 30-day program. Visit completed with patient by telephone.   Background:   Initial Transition Care Management Follow-up Telephone Call    Past Medical History:  Diagnosis Date   Anxiety    Atrial fibrillation [facility])    Atrial fibrillation ablation in 2019 - Dr. Kelsie   Chronic diastolic heart failure (HCC)    Depression    Essential hypertension    History of TIA (transient ischemic attack)    Hyperlipidemia    Hypothyroidism    Pneumonia     Assessment: Patient Reported Symptoms: Cognitive Cognitive Status: Alert and oriented to person, place, and time, Normal speech and language skills      Neurological Neurological Review of Symptoms: No symptoms reported    HEENT HEENT Symptoms Reported: No symptoms reported      Cardiovascular Cardiovascular Symptoms Reported: No symptoms reported    Respiratory Respiratory Symptoms Reported: Shortness of breath Other Respiratory Symptoms: shortness of breath with exertion- oxygen  as needed. Additional Respiratory Details: PFT done- patient to call pulmonology for follow up Respiratory Management Strategies: Oxygen  therapy  Endocrine Endocrine Symptoms Reported: No symptoms reported    Gastrointestinal Gastrointestinal Symptoms Reported: No symptoms reported      Genitourinary Genitourinary Symptoms Reported: No symptoms reported    Integumentary Integumentary Symptoms Reported: No symptoms reported    Musculoskeletal Musculoskelatal Symptoms Reviewed: Unsteady gait Additional Musculoskeletal Details: PT continues- patient reports feeling shaky at times.        Psychosocial Psychosocial Symptoms Reported: No symptoms reported         There were no vitals filed for this visit.  Medications Reviewed Today     Reviewed by Bronx Brogden, RN (Case Manager) on 09/15/23 at  1348  Med List Status: <None>   Medication Order Taking? Sig Documenting Provider Last Dose Status Informant  amiodarone  (PACERONE ) 200 MG tablet [id number] Yes TAKE 1 TABLET(200 MG) BY MOUTH DAILY Debera Jayson MATSU, MD  Active Pharmacy Records, Self  atorvastatin  (LIPITOR) 20 MG tablet [id number] Yes Take 20 mg by mouth daily. [provider]  Active Pharmacy Records, Self  benzonatate (TESSALON) 200 MG capsule [id number] Yes Take 200 mg by mouth 2 (two) times daily as needed. [provider]  Active Pharmacy Records, Self  dextromethorphan -guaiFENesin  (MUCINEX  DM) 30-600 MG 12hr tablet [id number] Yes Take 1 tablet by mouth 2 (two) times daily. [provider]  Active   diltiazem  (CARDIZEM  CD) 180 MG 24 hr capsule [id number] Yes TAKE 1 CAPSULE(180 MG) BY MOUTH DAILY Debera Jayson MATSU, MD  Active Pharmacy Records, Self  DULoxetine  (CYMBALTA ) 60 MG capsule [id number] Yes Take 1 capsule (60 mg total) by mouth daily. Ricky Fines, MD  Active Pharmacy Records, Self  FARXIGA  10 MG TABS tablet [id number] Yes TAKE 1 TABLET(10 MG) BY MOUTH DAILY BEFORE BREAKFAST Debera Jayson MATSU, MD  Active Pharmacy Records, Self  guaiFENesin  [facility]) 100 MG/5ML liquid [id number] Yes Take 10 mLs by mouth every 6 (six) hours as needed for cough or to loosen phlegm. Debrina Kizer-WarrenEtta PARAS, NP  Active Pharmacy Records, Self  hydrALAZINE  (APRESOLINE ) 25 MG tablet [id number] Yes Take 1 tablet (25 mg total) by mouth 2 (two) times daily. Evonnie Lenis, MD  Active   ipratropium (ATROVENT ) 0.06 % nasal spray [id number] Yes Place 2 sprays into both nostrils 3 (three) times daily. Tat,  Alm, MD  Active Pharmacy Records, Self  ipratropium-albuterol  (DUONEB) 0.5-2.5 (3) MG/3ML SOLN [id number] Yes Take 3 mLs by nebulization every 6 (six) hours as needed. Evonnie Alm, MD  Active Pharmacy Records, Self  Iron-FA-B Cmp-C-Biot-Probiotic (FUSION PLUS) CAPS [id number] Yes Take 1 capsule by mouth daily. [provider]  Active Pharmacy Records, Self  levothyroxine  (SYNTHROID ) 150 MCG tablet [id number] Yes Take 150 mcg by mouth daily. [provider]  Active Pharmacy Records, Self  metoprolol  succinate (TOPROL  XL) 25 MG 24 hr tablet [id number]  Take 1 tablet (25 mg total) by mouth daily. Debera Jayson MATSU, MD  Active Pharmacy Records, Self  potassium chloride  SA (KLOR-CON  M) 20 MEQ tablet [id number] Yes TAKE 1 TABLET(20 MEQ) BY MOUTH DAILY Debera Jayson MATSU, MD  Active Pharmacy Records, Self  predniSONE  (DELTASONE ) 10 MG tablet [id number]  Take 5 tablets (50 mg total) by mouth daily with breakfast. And decrease by one tablet daily  Patient not taking: Reported on 09/07/2023   Evonnie Alm, MD  Active   pregabalin  (LYRICA ) 75 MG capsule [id number] Yes Take 1 capsule (75 mg total) by mouth at bedtime. For restless leg symptoms Hope Almarie ORN, NP  Active Pharmacy Records, Self  rivaroxaban  (XARELTO ) 20 MG TABS tablet [id number] Yes Take 1 tablet (20 mg total) by mouth daily with supper. Debera Jayson MATSU, MD  Active   [facility] 160-4.5 MCG/ACT inhaler [id number] Yes Inhale 2 puffs into the lungs 2 (two) times daily. [provider]  Active Pharmacy Records, Self  torsemide  (DEMADEX ) 20 MG tablet [id number] Yes Take 2 tablets (40 mg total) by mouth daily. Evonnie Alm, MD  Active             Goals Addressed             This Visit's Progress    VBCI Transitions of Care (TOC) Care Plan       Problems:  Recent Hospitalization for treatment of COPD and Pneumonia Knowledge Deficit Related to Pneumonia  Goal:  Over the next 30 days, the patient will not experience hospital readmission  Interventions:  Transitions of Care: Doctor Visits  - discussed the importance of doctor visits Reviewed Signs and symptoms of infection  COPD Interventions: Advised patient to self assesses COPD action plan zone and make appointment with provider if in the yellow zone for 48 hours without improvement Use of  home oxygen  Patient has not seen PCP, Reviewed importance of seeing PCP Advised to keep Oxygen  saturation 88-90%  Patient Self Care Activities:  Attend all scheduled provider appointments Call provider office for new concerns or questions  Notify RN Care Manager of TOC call rescheduling needs Participate in Transition of Care Program/Attend TOC scheduled calls Take medications as prescribed   follow rescue plan if symptoms flare-up Call pulmonologist for follow up Call PCP for appointment  Plan:  Telephone follow up appointment with care management team member scheduled for:  09/23/23 @ 1200 pm        Recommendation:   Continue Current Plan of Care  Follow Up Plan:   Telephone follow-up in 1 week  Perrie Ragin DOROTHA Seeds RN, MSN [facility], [facility] RN Care Manager Direct Dial: [phone]  Fax: [phone] Website: [email or web address]

## 2023-09-17 ENCOUNTER — Ambulatory Visit (HOSPITAL_COMMUNITY)
Admission: RE | Admit: 2023-09-17 | Discharge: 2023-09-17 | Disposition: A | Discharge status: Home / Self Care | Source: Ambulatory Visit | Attending: Family Medicine | Admitting: Family Medicine

## 2023-09-17 DIAGNOSIS — G8929 Other chronic pain: Secondary | ICD-10-CM | POA: Diagnosis not present

## 2023-09-17 DIAGNOSIS — M19011 Primary osteoarthritis, right shoulder: Secondary | ICD-10-CM | POA: Diagnosis not present

## 2023-09-17 DIAGNOSIS — M25511 Pain in right shoulder: Secondary | ICD-10-CM | POA: Diagnosis not present

## 2023-09-17 DIAGNOSIS — M85811 Other specified disorders of bone density and structure, right shoulder: Secondary | ICD-10-CM | POA: Diagnosis not present

## 2023-09-20 ENCOUNTER — Encounter: Payer: Self-pay | Admitting: Cardiology

## 2023-09-20 ENCOUNTER — Other Ambulatory Visit: Payer: Self-pay | Admitting: Cardiology

## 2023-09-21 ENCOUNTER — Ambulatory Visit: Payer: Self-pay | Admitting: Cardiology

## 2023-09-23 ENCOUNTER — Other Ambulatory Visit: Payer: Self-pay

## 2023-09-23 ENCOUNTER — Other Ambulatory Visit: Payer: Self-pay | Admitting: Cardiology

## 2023-09-23 DIAGNOSIS — J441 Chronic obstructive pulmonary disease with (acute) exacerbation: Secondary | ICD-10-CM

## 2023-09-23 NOTE — Patient Instructions (Signed)
 Visit Information  Thank you for taking time to visit with me today. Please don't hesitate to contact me if I can be of assistance to you.   Following is a copy of your care plan:   Goals Addressed             This Visit's Progress    COMPLETED: VBCI Transitions of Care (TOC) Care Plan       Problems:  Recent Hospitalization for treatment of COPD and Pneumonia Knowledge Deficit Related to Pneumonia  Goal:  Over the next 30 days, the patient will not experience hospital readmission  Interventions:   09/23/23 Patient reports having to use her breathing treatment last night but feels better today.  Discussed COPD and weather.  Advised to stay indoors today.  Patient has completed 30 day TOC. Patient agreeable to CCM program.  Referral complete.  Transitions of Care: Doctor Visits  - discussed the importance of doctor visits Reviewed Signs and symptoms of infection  COPD Interventions: Advised patient to self assesses COPD action plan zone and make appointment with provider if in the yellow zone for 48 hours without improvement Use of home oxygen  Patient has not seen PCP, Reviewed importance of seeing PCP Advised to keep Oxygen  saturation 88-90%  Patient Self Care Activities:  Attend all scheduled provider appointments Call provider office for new concerns or questions  Notify RN Care Manager of TOC call rescheduling needs Participate in Transition of Care Program/Attend TOC scheduled calls Take medications as prescribed   follow rescue plan if symptoms flare-up Call pulmonologist for follow up Call PCP for appointment  Plan:  Refer to CCM        Patient verbalizes understanding of instructions and care plan provided today and agrees to view in MyChart. Active MyChart status and patient understanding of how to access instructions and care plan via MyChart confirmed with patient.     The patient has been provided with contact information for the care management team  and has been advised to call with any health related questions or concerns.   Please call the care guide team at [phone] if you need to cancel or reschedule your appointment.   Please call the Suicide and Crisis Lifeline: 988 if you are experiencing a Mental Health or Behavioral Health Crisis or need someone to talk to.  Cinque Begley J. Delva Derden RN, MSN [facility], [facility] Health RN Care Manager Direct Dial: [phone]  Fax: [phone] Website: [email or web address]

## 2023-09-23 NOTE — Transitions of Care (Post Inpatient/ED Visit) (Signed)
 Transition of Care week 5  Visit Note  09/23/2023  Name: Diane Cohen MRN: [id number]          DOB: [birth year]  Situation: Patient enrolled in [facility] 30-day program. Visit completed with patient by telephone.   Background:   Initial Transition Care Management Follow-up Telephone Call    Past Medical History:  Diagnosis Date   Anxiety    Atrial fibrillation [facility])    Atrial fibrillation ablation in 2019 - Dr. Kelsie   Chronic diastolic heart failure (HCC)    Depression    Essential hypertension    History of TIA (transient ischemic attack)    Hyperlipidemia    Hypothyroidism    Pneumonia     Assessment: Patient Reported Symptoms: Cognitive Cognitive Status: Alert and oriented to person, place, and time, Normal speech and language skills      Neurological Neurological Review of Symptoms: No symptoms reported    HEENT HEENT Symptoms Reported: No symptoms reported      Cardiovascular Cardiovascular Symptoms Reported: No symptoms reported    Respiratory Respiratory Symptoms Reported: Shortness of breath Other Respiratory Symptoms: Shortness of breath with exertion.  Patient reports having some shortness of breath last night but utilized her breathing treatment and symptoms subsided.  Discussed COPD Respiratory Management Strategies: Oxygen  therapy (Using oxygen  at night.  Sats without oxygen  in the 90's per patient)  Endocrine Endocrine Symptoms Reported: No symptoms reported    Gastrointestinal Gastrointestinal Symptoms Reported: No symptoms reported      Genitourinary Genitourinary Symptoms Reported: No symptoms reported    Integumentary Integumentary Symptoms Reported: No symptoms reported    Musculoskeletal Musculoskelatal Symptoms Reviewed: Unsteady gait Additional Musculoskeletal Details: PT  continues.        Psychosocial Psychosocial Symptoms Reported: No symptoms reported         There were no vitals filed for this visit.  Medications Reviewed Today      Reviewed by Mariateresa Batra, RN (Case Manager) on 09/23/23 at 1152  Med List Status: <None>   Medication Order Taking? Sig Documenting Provider Last Dose Status Informant  amiodarone  (PACERONE ) 200 MG tablet [id number] Yes TAKE 1 TABLET(200 MG) BY MOUTH DAILY Debera Jayson MATSU, MD  Active Pharmacy Records, Self  atorvastatin  (LIPITOR) 20 MG tablet [id number] Yes Take 20 mg by mouth daily. [provider]  Active Pharmacy Records, Self  benzonatate (TESSALON) 200 MG capsule [id number] Yes Take 200 mg by mouth 2 (two) times daily as needed. [provider]  Active Pharmacy Records, Self  dextromethorphan -guaiFENesin  (MUCINEX  DM) 30-600 MG 12hr tablet [id number] Yes Take 1 tablet by mouth 2 (two) times daily. [provider]  Active   diltiazem  (CARDIZEM  CD) 180 MG 24 hr capsule [id number] Yes TAKE 1 CAPSULE(180 MG) BY MOUTH DAILY Debera Jayson MATSU, MD  Active Pharmacy Records, Self  DULoxetine  (CYMBALTA ) 60 MG capsule [id number] Yes Take 1 capsule (60 mg total) by mouth daily. Ricky Fines, MD  Active Pharmacy Records, Self  FARXIGA  10 MG TABS tablet [id number] Yes TAKE 1 TABLET(10 MG) BY MOUTH DAILY BEFORE BREAKFAST Debera Jayson MATSU, MD  Active Pharmacy Records, Self  guaiFENesin  [facility]) 100 MG/5ML liquid [id number] Yes Take 10 mLs by mouth every 6 (six) hours as needed for cough or to loosen phlegm. Tamani Durney-Warren, Etta PARAS, NP  Active Pharmacy Records, Self  hydrALAZINE  (APRESOLINE ) 25 MG tablet [id number] Yes Take 1 tablet (25 mg total) by mouth 2 (two) times daily. Evonnie Lenis, MD  Active   ipratropium (ATROVENT ) 0.06 % nasal  spray [id number] Yes Place 2 sprays into both nostrils 3 (three) times daily. Evonnie Lenis, MD  Active Pharmacy Records, Self  ipratropium-albuterol  (DUONEB) 0.5-2.5 (3) MG/3ML SOLN [id number] Yes Take 3 mLs by nebulization every 6 (six) hours as needed. Evonnie Lenis, MD  Active Pharmacy Records, Self  Iron-FA-B Cmp-C-Biot-Probiotic (FUSION PLUS) CAPS  [id number] Yes Take 1 capsule by mouth daily. [provider]  Active Pharmacy Records, Self  levothyroxine  (SYNTHROID ) 150 MCG tablet [id number] Yes Take 150 mcg by mouth daily. [provider]  Active Pharmacy Records, Self  metoprolol  succinate (TOPROL  XL) 25 MG 24 hr tablet [id number] Yes Take 1 tablet (25 mg total) by mouth daily. Debera Jayson MATSU, MD  Active Pharmacy Records, Self  potassium chloride  SA (KLOR-CON  M) 20 MEQ tablet [id number] Yes TAKE 1 TABLET(20 MEQ) BY MOUTH DAILY Debera Jayson MATSU, MD  Active Pharmacy Records, Self  predniSONE  (DELTASONE ) 10 MG tablet [id number]  Take 5 tablets (50 mg total) by mouth daily with breakfast. And decrease by one tablet daily  Patient not taking: Reported on 09/23/2023   Evonnie Lenis, MD  Active   pregabalin  (LYRICA ) 75 MG capsule [id number] Yes Take 1 capsule (75 mg total) by mouth at bedtime. For restless leg symptoms Hope Almarie ORN, NP  Active Pharmacy Records, Self  rivaroxaban  (XARELTO ) 20 MG TABS tablet [id number] Yes Take 1 tablet (20 mg total) by mouth daily with supper. Debera Jayson MATSU, MD  Active   [facility] 160-4.5 MCG/ACT inhaler [id number] Yes Inhale 2 puffs into the lungs 2 (two) times daily. [provider]  Active Pharmacy Records, Self  torsemide  (DEMADEX ) 20 MG tablet [id number] Yes Take 2 tablets (40 mg total) by mouth daily. Debera Jayson MATSU, MD  Active             Recommendation:   Referral to: CCM  Follow Up Plan:   Referral to RN Case Manager for CCM  Jerick Khachatryan DOROTHA Seeds RN, MSN [facility], [facility] Health RN Care Manager Direct Dial: [phone]  Fax: [phone] Website: [email or web address]

## 2023-09-24 ENCOUNTER — Telehealth: Payer: Self-pay

## 2023-09-24 NOTE — Progress Notes (Signed)
 Complex Care Management Note  Care Guide Note 09/24/2023 Name: TEKA CHANDA MRN: [id number] DOB: 01/28/1946  JILLANA SELPH is a 78 y.o. year old female who sees Shona, Norleen PEDLAR, MD for primary care. I reached out to Harland KATHEE Somerset by phone today to offer complex care management services.  Ms. Boulay was given information about Complex Care Management services today including:   The Complex Care Management services include support from the care team which includes your Nurse Care Manager, Clinical Social Worker, or Pharmacist.  The Complex Care Management team is here to help remove barriers to the health concerns and goals most important to you. Complex Care Management services are voluntary, and the patient may decline or stop services at any time by request to their care team member.   Complex Care Management Consent Status: Patient agreed to services and verbal consent obtained.   Follow up plan:  Telephone appointment with complex care management team member scheduled for:  10/07/23 @ 2 pm.   Encounter Outcome:  Patient Scheduled  Leotis Rase [facility], [facility] Guide  Direct Dial: [phone]  Fax [phone]

## 2023-09-27 ENCOUNTER — Ambulatory Visit: Admitting: Orthopedic Surgery

## 2023-09-27 DIAGNOSIS — M25511 Pain in right shoulder: Secondary | ICD-10-CM | POA: Diagnosis not present

## 2023-10-01 ENCOUNTER — Telehealth: Payer: Self-pay

## 2023-10-01 NOTE — Telephone Encounter (Signed)
 Up to date on meds, next review in a week

## 2023-10-07 ENCOUNTER — Encounter: Payer: Self-pay | Admitting: *Deleted

## 2023-10-07 ENCOUNTER — Other Ambulatory Visit: Payer: Self-pay

## 2023-10-07 ENCOUNTER — Other Ambulatory Visit: Payer: Self-pay | Admitting: *Deleted

## 2023-10-07 NOTE — Patient Instructions (Signed)
 Visit Information  Thank you for taking time to visit with me today. Please don't hesitate to contact me if I can be of assistance to you before our next scheduled appointment.  Your next care management appointment is by telephone on 10/14/23 at 1:15 pm  Please review included education on hypothyroidism  Please call the care guide team at [phone] if you need to cancel, schedule, or reschedule an appointment.   Please call the Suicide and Crisis Lifeline: 988 call the [place]  National Suicide Prevention Lifeline: 850-331-8058 or TTY: 463-352-7900 TTY (616) 564-3373) to talk to a trained counselor call 1-800-273-TALK (toll free, 24 hour hotline) call the [facility]: 408-184-0500 call 911 if you are experiencing a Mental Health or Behavioral Health Crisis or need someone to talk to.  Kaileen Bronkema L. Ramonita, RN, BSN, CCM [place]  [facility], [facility] Health RN Care Manager Direct Dial: [phone]  Fax: [phone]

## 2023-10-07 NOTE — Patient Outreach (Signed)
 Complex Care Management   Visit Note  10/07/2023  Name:  Diane Cohen MRN: [id number] DOB: 1945/08/04  Situation: Referral received for Complex Care Management related to COPD I obtained verbal consent from Patient.  Visit completed with Harland KATHEE Somerset  on the phone  Denies any worsening COPD symptoms- does get short of breath with exertion   Her voiced concern today is related increase sleeping/fatigue no matter what time of the day she goes to bed Headaches (entire head) resolved with tylenol  Tired blurred vision-Watch a lot of TV and word finding puzzles  Scheduled to see  Dr Vicci in [place] on 11/04/23 about her vision  congestive Heart Failure (CHF) Has a  little swelling but denies an increase in weight 3-5 lbs  Right shoulder pain - seen on 09/27/23 and referred to have a subacromial injection  Background:   Past Medical History:  Diagnosis Date   Anxiety    Atrial fibrillation (HCC)    Atrial fibrillation ablation in 2019 - Dr. Kelsie   Chronic diastolic heart failure (HCC)    Depression    Essential hypertension    History of TIA (transient ischemic attack)    Hyperlipidemia    Hypothyroidism    Pneumonia     Assessment: Patient Reported Symptoms:  Cognitive Cognitive Status: No symptoms reported Cognitive/Intellectual Conditions Management [RPT]: None reported or documented in medical history or problem list   Healing Pattern: Average  Neurological Neurological Review of Symptoms: Headaches    HEENT HEENT Symptoms Reported: No symptoms reported      Cardiovascular Cardiovascular Symptoms Reported: Fatigue, Swelling in legs or feet Does patient have uncontrolled Hypertension?: No Cardiovascular Management Strategies: Medication therapy, Adequate rest Cardiovascular Self-Management Outcome: 3 (uncertain) Cardiovascular Comment: no check BP only oximetry 90s pulse  Respiratory Respiratory Symptoms Reported: No symptoms reported Other Respiratory  Symptoms: a few morning cousgh a few Respiratory Management Strategies: Medication therapy  Endocrine Endocrine Symptoms Reported: Blurry vision, Headaches, Increased thirst, Weakness or fatigue, Shortness of breath, Shakiness, Sweating Is patient diabetic?: No Endocrine Self-Management Outcome: 3 (uncertain) Endocrine Comment: had sweating shakiness x 1  Gastrointestinal Gastrointestinal Symptoms Reported: No symptoms reported Gastrointestinal Management Strategies: Medication therapy, Fluid modification Gastrointestinal Self-Management Outcome: 4 (good)    Genitourinary Genitourinary Symptoms Reported: No symptoms reported Genitourinary Management Strategies: Adequate rest, Fluid modification, Medication therapy Genitourinary Self-Management Outcome: 4 (good)  Integumentary Integumentary Symptoms Reported: No symptoms reported Skin Management Strategies: Routine screening Skin Self-Management Outcome: 4 (good)  Musculoskeletal Musculoskelatal Symptoms Reviewed: Unsteady gait Musculoskeletal Management Strategies: Adequate rest, Fluid modification, Medical device, Medication therapy Musculoskeletal Self-Management Outcome: 3 (uncertain) Falls in the past year?: No Number of falls in past year: 1 or less Was there an injury with Fall?: No Fall Risk Category Calculator: 0 Patient Fall Risk Level: Low Fall Risk Patient at Risk for Falls Due to: Impaired mobility Fall risk Follow up: Falls evaluation completed, Falls prevention discussed  Psychosocial Psychosocial Symptoms Reported: No symptoms reported Behavioral Management Strategies: Adequate rest Behavioral Health Self-Management Outcome: 4 (good) Behavioral Health Comment: confirms she needs to be more active in the home Major Change/Loss/Stressor/Fears (CP): Medical condition, self Techniques to Cope with Loss/Stress/Change: Diversional activities Quality of Family Relationships: helpful, supportive Do you feel physically  threatened by others?: No      10/07/2023    4:49 PM  Depression screen PHQ 2/9  Decreased Interest 0  Down, Depressed, Hopeless 0  PHQ - 2 Score 0    There were no vitals  filed for this visit.  Medications Reviewed Today     Reviewed by Ramonita Suzen CROME, RN (Registered Nurse) on 10/07/23 at 1735  Med List Status: <None>   Medication Order Taking? Sig Documenting Provider Last Dose Status Informant  amiodarone  (PACERONE ) 200 MG tablet [id number] Yes TAKE 1 TABLET(200 MG) BY MOUTH DAILY Debera Jayson MATSU, MD  Active   atorvastatin  (LIPITOR) 20 MG tablet [id number] Yes Take 20 mg by mouth daily. [provider]  Active Pharmacy Records, Self  benzonatate (TESSALON) 200 MG capsule [id number] Yes Take 200 mg by mouth 2 (two) times daily as needed. [provider]  Active Pharmacy Records, Self  dextromethorphan -guaiFENesin  (MUCINEX  DM) 30-600 MG 12hr tablet [id number] Yes Take 1 tablet by mouth 2 (two) times daily. [provider]  Active   diltiazem  (CARDIZEM  CD) 180 MG 24 hr capsule [id number] Yes TAKE 1 CAPSULE(180 MG) BY MOUTH DAILY Debera Jayson MATSU, MD  Active Pharmacy Records, Self  docusate sodium  (COLACE) 50 MG capsule [id number] Yes Take 50 mg by mouth 2 (two) times daily. [provider]  Active   DULoxetine  (CYMBALTA ) 60 MG capsule [id number] Yes Take 1 capsule (60 mg total) by mouth daily. Ricky Fines, MD  Active Pharmacy Records, Self  FARXIGA  10 MG TABS tablet [id number] Yes TAKE 1 TABLET(10 MG) BY MOUTH DAILY BEFORE BREAKFAST Debera Jayson MATSU, MD  Active Pharmacy Records, Self  guaiFENesin  [facility]) 100 MG/5ML liquid [id number] Yes Take 10 mLs by mouth every 6 (six) hours as needed for cough or to loosen phlegm. Leath-WarrenEtta PARAS, NP  Active Pharmacy Records, Self  hydrALAZINE  (APRESOLINE ) 25 MG tablet [id number] Yes Take 1 tablet (25 mg total) by mouth 2 (two) times daily. Evonnie Lenis, MD  Active   ipratropium (ATROVENT ) 0.06 % nasal  spray [id number] Yes Place 2 sprays into both nostrils 3 (three) times daily. Evonnie Lenis, MD  Active Pharmacy Records, Self  ipratropium-albuterol  (DUONEB) 0.5-2.5 (3) MG/3ML SOLN [id number] Yes Take 3 mLs by nebulization every 6 (six) hours as needed. Evonnie Lenis, MD  Active Pharmacy Records, Self  Iron-FA-B Cmp-C-Biot-Probiotic (FUSION PLUS) CAPS [id number] Yes Take 1 capsule by mouth daily. [provider]  Active Pharmacy Records, Self  levothyroxine  (SYNTHROID ) 150 MCG tablet [id number] Yes Take 150 mcg by mouth daily. [provider]  Active Pharmacy Records, Self  metoprolol  succinate (TOPROL  XL) 25 MG 24 hr tablet [id number] Yes Take 1 tablet (25 mg total) by mouth daily. Debera Jayson MATSU, MD  Active Pharmacy Records, Self  potassium chloride  SA (KLOR-CON  M) 20 MEQ tablet [id number] Yes TAKE 1 TABLET(20 MEQ) BY MOUTH DAILY Debera Jayson MATSU, MD  Active Pharmacy Records, Self  predniSONE  (DELTASONE ) 10 MG tablet [id number] Yes Take 5 tablets (50 mg total) by mouth daily with breakfast. And decrease by one tablet daily Tat, David, MD  Active   pregabalin  (LYRICA ) 75 MG capsule [id number] Yes Take 1 capsule (75 mg total) by mouth at bedtime. For restless leg symptoms Hope Almarie ORN, NP  Active Pharmacy Records, Self  rivaroxaban  (XARELTO ) 20 MG TABS tablet [id number] Yes Take 1 tablet (20 mg total) by mouth daily with supper. Debera Jayson MATSU, MD  Active   [facility] 160-4.5 MCG/ACT inhaler [id number] Yes Inhale 2 puffs into the lungs 2 (two) times daily. [provider]  Active Pharmacy Records, Self  torsemide  (DEMADEX ) 20 MG tablet [id number] Yes Take 2 tablets (40 mg total) by mouth daily. Debera Jayson MATSU, MD  Active  Recommendation:   12/01/23 PCP Follow-up Specialty provider follow-up pulmonology 10/12/23 cardiology 12/15/23 Continue Current Plan of Care Consult pcp for advise on management of hypothyroidism  Follow Up Plan:   Telephone follow up  appointment date/time:  10/14/23 1:15 pm  Daneli Butkiewicz L. Ramonita, RN, BSN, CCM [place]  [facility] RN Care Manager Direct Dial: [phone]  Fax: [phone]

## 2023-10-11 ENCOUNTER — Other Ambulatory Visit: Payer: Self-pay | Admitting: Cardiology

## 2023-10-12 ENCOUNTER — Telehealth: Payer: Self-pay

## 2023-10-12 ENCOUNTER — Ambulatory Visit (INDEPENDENT_AMBULATORY_CARE_PROVIDER_SITE_OTHER): Admitting: Primary Care

## 2023-10-12 ENCOUNTER — Ambulatory Visit (HOSPITAL_COMMUNITY)
Admission: RE | Admit: 2023-10-12 | Discharge: 2023-10-12 | Disposition: A | Discharge status: Home / Self Care | Source: Ambulatory Visit | Attending: Primary Care | Admitting: Primary Care

## 2023-10-12 ENCOUNTER — Ambulatory Visit: Payer: Self-pay | Admitting: Primary Care

## 2023-10-12 ENCOUNTER — Encounter: Payer: Self-pay | Admitting: Primary Care

## 2023-10-12 VITALS — BP 124/68 | HR 71 | Ht 61.0 in | Wt 160.2 lb

## 2023-10-12 DIAGNOSIS — J40 Bronchitis, not specified as acute or chronic: Secondary | ICD-10-CM | POA: Diagnosis not present

## 2023-10-12 DIAGNOSIS — G4734 Idiopathic sleep related nonobstructive alveolar hypoventilation: Secondary | ICD-10-CM | POA: Diagnosis not present

## 2023-10-12 DIAGNOSIS — J189 Pneumonia, unspecified organism: Secondary | ICD-10-CM | POA: Diagnosis not present

## 2023-10-12 DIAGNOSIS — G2581 Restless legs syndrome: Secondary | ICD-10-CM

## 2023-10-12 DIAGNOSIS — J984 Other disorders of lung: Secondary | ICD-10-CM

## 2023-10-12 DIAGNOSIS — I4891 Unspecified atrial fibrillation: Secondary | ICD-10-CM | POA: Diagnosis not present

## 2023-10-12 DIAGNOSIS — I110 Hypertensive heart disease with heart failure: Secondary | ICD-10-CM | POA: Diagnosis not present

## 2023-10-12 DIAGNOSIS — J929 Pleural plaque without asbestos: Secondary | ICD-10-CM | POA: Diagnosis not present

## 2023-10-12 MED ORDER — PREGABALIN 150 MG PO CAPS: 150.0000 mg | ORAL_CAPSULE | Freq: Every day | ORAL | 5 refills | Status: DC

## 2023-10-12 NOTE — Progress Notes (Signed)
 Please let patient know CXR was normal, no residual pneumonia. No further imaging needed

## 2023-10-12 NOTE — Patient Instructions (Signed)
  VISIT SUMMARY: During your follow-up visit, we reviewed your recovery from pneumonia, your chronic bronchitis, mild restrictive lung disease due to scoliosis, and restless legs syndrome. Your symptoms have improved significantly since your hospitalization, and we discussed ongoing management and potential adjustments to your treatment plan.  YOUR PLAN: -RIGHT UPPER LOBE PNEUMONIA: Pneumonia is an infection that inflames the air sacs in one or both lungs. Your symptoms have improved, and a recent chest x-ray shows continued improvement. We are waiting for the final radiologist report to confirm full resolution. If the infection has not completely resolved, we may consider a chest CT scan.  -CHRONIC BRONCHITIS WITH RECENT EXACERBATION: Chronic bronchitis is a long-term inflammation of the bronchi in the lungs. You have had recurrent episodes, with the most recent one in May. Continue using Symbicort twice daily and your nebulizer as needed for chest tightness. Maintain oxygen  therapy at night and during exertion to keep your oxygen  levels above 88%.  -MILD RESTRICTIVE LUNG DISEASE DUE TO SCOLIOSIS: Restrictive lung disease means your lungs cannot fully expand, often due to an external factor like scoliosis. Your lung function is mildly reduced, likely due to your spinal curvature. No surgical intervention is recommended at this time, but you can discuss non-surgical options with your primary care doctor or a spine specialist if you wish.  -RESTLESS LEGS SYNDROME (PERIODIC LIMB MOVEMENT DISORDER): Restless legs syndrome causes an uncontrollable urge to move your legs, usually due to an uncomfortable sensation. Your current dose of Lyrica  may not be sufficient, so you can increase it to 150 mg. Monitor for increased daytime sleepiness and adjust the dose if necessary.  INSTRUCTIONS: Await the radiologist's report on your recent chest x-ray. If your pneumonia has not completely resolved, we may need to do  a chest CT scan. Continue your current medications and oxygen  therapy as discussed. If you increase your Lyrica  dose, monitor for any side effects and let us  know if you experience increased daytime sleepiness.  Follow-up 6 months or sooner if needed

## 2023-10-12 NOTE — Telephone Encounter (Signed)
 Per pharmacy filling all meds today so should be up to date after this. Will check again in September to confirm next fills occur

## 2023-10-12 NOTE — Progress Notes (Signed)
 @Patient  ID: Diane Cohen, female    DOB: November 18, 1945, 78 y.o.   MRN: [id number]  No chief complaint on file.   Referring provider: Shona Norleen PEDLAR, MD  HPI: 78 year old female, former smoker. PMH significant for afib, CHF, HTN, hypothyroidism, anemia.  Previous [facility] pulmonary encounter: 08/19/2022 Patient presents today for sleep consult. Patient was referred today by primary care due to A-fib.  Patient has symptoms of snoring.  Mild associated daytime fatigue.  She reports sleeping well at night. Patient reports most recent episode of A-fib was last week. Typical bedtime is between 1 AM and 4 AM.  It does not take her long to fall asleep.  She does not wake up after falling asleep.  She starts her day between 930 and 10 AM.  She had a previous study done in [place]  approximately 10 years ago, results are not available.  She is not currently on CPAP and does not wear oxygen .  No respiratory complaints.  No concern for narcolepsy or cataplexy.  Epworth score at 16.  04/26/2023 Discussed the use of AI scribe software for clinical note transcription with the patient, who gave verbal consent to proceed.  History of Present Illness   Diane Cohen is a 78 year old female with atrial fibrillation who presents with sleep disturbances and nocturnal hypoxemia. She was referred by primary care for evaluation of atrial fibrillation.  PSG on 11/27/22 was negative for obstructive sleep apnea, AHI 2.1/hour. Supplemental O2 was added. Th patient snored with moderate volume along with PACs. Dx nocturnal hypoxia and periodic limb movement was noted   She experiences nocturnal hypoxemia with oxygen  levels dropping into the 80s during sleep, as identified in a recent sleep study. This has necessitated the use of supplemental oxygen . She has been provided with an oxygen  concentrator set at two liters but reports inconsistent use due to its loudness of the machine.  She reports sleep disturbances characterized by  snoring and daytime fatigue. Her typical bedtime is between 1 and 4 AM, and she usually falls asleep quickly without waking up during the night. However, she has been waking up more frequently recently. She starts her day between 9:30 and 10 AM. A sleep study revealed moderate loud snoring and premature atrial contractions, which may be related to the oxygen  drops at night. Occasionally, she wakes herself up with snoring. Her husband, who used to comment on her snoring, passed away two years ago, so she no longer has someone to report on her snoring.  She experiences periodic limb movements during sleep, which sometimes wake her up. She has not been on CPAP or oxygen  therapy previously.      Nocturnal Hypoxemia PSG in September 2024 was negative for OSA but oxygen  saturation levels did drop in the 80s. Patient has been provided with supplemental oxygen  but reports inconsistent use due to noise from the machine. -Encourage consistent use of supplemental oxygen  at 1-2 liters during sleep. -Consider contacting oxygen  supply company for potential servicing if noise continues to be disruptive.   Periodic Limb Movement Limb movements were noted during in-lab sleep study which were associated with arousals. Patient reports these movements are disruptive to sleep. -Recommend starting Lyrica  75mg  at bedtime to help prevent restless legs and periodic limb movements. -Schedule follow-up in 3-4 months to assess effectiveness of medication, with option for patient to contact sooner via MyChart or phone call if adjustment is needed.   Snoring Patient reports occasional snoring and waking herself up. No  significant obstructive sleep apnea noted in sleep study. -Advise patient to avoid sleeping on back and consider use of a wedge pillow to potentially reduce snoring. -Reiterate that CPAP is not needed based on sleep study results.   Atrial Fibrillation History of atrial fibrillation, with recent report of no  attacks. Noted premature atrial contractions during sleep study, potentially related to nocturnal hypoxemia. -Continue current management plan for atrial fibrillation as directed by cardiologist. -Encourage consistent use of supplemental oxygen  during sleep to potentially reduce cardiac symptoms.   08/30/2023 Discussed the use of AI scribe software for clinical note transcription with the patient, who gave verbal consent to proceed.  History of Present Illness   Diane Cohen is a 78 year old female with chronic bronchitis who presents for evaluation of her respiratory status and oxygen  use.  She has a history of pneumonia and chronic bronchitis, with a recent hospitalization from March 28 to June 12, 2023, for pneumonia. During her stay, she was treated with oxygen , ceftriaxone , azithromycin , steroids, bronchodilators, and nebulizers. Upon discharge, she was given a nebulizer and later prescribed an inhaler. A follow-up on June 21, 2023, resulted in a prescription for Symbicort.  In May 2025, she visited urgent care with chest congestion, fever, and shortness of breath, leading to a diagnosis of chronic bronchitis. Treatment included Augmentin , prednisone , and guaifenesin . She was hospitalized again on August 14, 2023, initially treated for pneumonia, and later informed of a possible COPD diagnosis. She feels better today than in the past, though she experienced weakness and shakiness after her second hospitalization.  She uses oxygen  primarily at night with a home concentrator. Her portable oxygen  tank is nearly empty despite recent delivery, having been used for three outings. She is interested in a Designer, jewellery for easier mobility. No current cough, mucus production, or shortness of breath. She uses Symbicort twice daily and a nebulizer for acute symptoms.  She is a former smoker, having quit many years ago, with no significant occupational exposures. She has a history of asthma as a child  and scoliosis, which has worsened with age.      1. Pneumonia of right upper lobe due to infectious organism (Primary) - DG Chest 2 View; Future - Ambulatory Referral for DME - Pulmonary Function Test; Future  2. Acute on chronic respiratory failure with hypoxia (HCC) - AMB REFERRAL FOR DME  Assessment and Plan    Chronic bronchitis Former smoker quit in 1974. Recurrent episodes with recent exacerbation in May, presenting with chest congestion, fever, and dyspnea. Previously managed with Augmentin , prednisone , and guaifenesin . Suspected chronic bronchitis; COPD not confirmed pending pulmonary function testing. - Order pulmonary function test to assess for obstructive lung disease - Continue Symbicort 160mcg inhaler twice daily - Use ipratropium-albuterol  nebulizer as needed for acute symptoms  Recurrent pneumonia Recent hospitalization in June for pneumonia. Symptoms have improved. No current cough, sputum production, or significant dyspnea. Chest x-ray required to confirm resolution of right upper lobe pneumonia. - Order chest x-ray to ensure resolution of previous right upper lobe pneumonia  Acute on chronic respiratory failure PSG in September 2024 negative for OSA, however, oxygen  levels did drop into the 80s. Currently using nocturnal oxygen  therapy. Recently qualified for daytime oxygen  during hospitalization in June. O2 levels stayed >92% on room air during walk test today. Issues with empty oxygen  tank; home concentrator available. Interested in Designer, jewellery.  - Continue 2L oxygen  at night and with exertion to maintain O2 >88-90%  - Best fit eval through  DME for portable oxygen  concentrator - Ensure delivery of new oxygen  tanks  Pulmonary nodules Small pulmonary nodules (2-3 mm) on CT scan, stable in size, not concerning for malignancy. No evidence of interstitial lung disease or pulmonary fibrosis.  Scoliosis Long-standing scoliosis since childhood, no significant  impact on current respiratory function. Potential restriction of lung function if kyphosis or scoliosis progresses.  Periodic limb movement - Continue Lyrica  at bedtime   CXR showed continued improvement in right sided pneumonia, other areas have resolved. Needs follow-up in another 4 weeks to ensure complete resolution, please order CXR re: pneumonia   10/12/2023- Interim hx History of Present Illness  Discussed the use of AI scribe software for clinical note transcription with the patient, who gave verbal consent to proceed.  History of Present Illness   Diane Cohen is a 78 year old female who presents for a follow-up visit after hospitalization for pneumonia.  She was hospitalized in June for pneumonia affecting the right lower and right upper lobes. Her symptoms have improved significantly since then, with no current cough, sputum production, or dyspnea. A recent chest x-ray showed continued improvement, but the official radiologist report is pending.  She has a history of chronic bronchitis and is a former smoker, having quit in 1974. She experiences recurrent episodes of bronchitis, with the most recent exacerbation occurring in May, presenting with chest congestion, fever, and dyspnea. Her condition has been managed with prednisone , Augmentin , and guaifenesin . She uses Symbicort twice daily and a nebulizer as needed for breakthrough symptoms, which she has used a couple of times recently for chest tightness, with relief of symptoms. No mucus production, purulence, or hemoptysis.  She has mild restrictive lung disease as shown by pulmonary function tests in July. She uses oxygen  at night due to a sleep study in November 2024 showing oxygen  desaturation into the 80s, and she recently qualified for daytime oxygen  during her June hospitalization. However, her O2 levels stayed above 92% on room air during a walk test in June.  She has longstanding scoliosis since childhood. No back  pain.  She is on Lyrica  75 mg for periodic limb movement, which initially worked well but now seems less effective, as she wakes up needing to stretch her legs. She feels sleepy later in the day, which may be related to Lyrica  use.      Allergies  Allergen Reactions   Levofloxacin Other (See Comments)    Headache  levofloxacin   Misc. Sulfonamide Containing Compounds Other (See Comments)   Sulfa Antibiotics Hives   Sulfamethoxazole Other (See Comments)    sulfamethoxazole    Immunization History  Administered Date(s) Administered   Influenza, High Dose Seasonal PF 01/15/2018, 12/17/2021   Influenza,inj,Quad PF,6+ Mos 12/29/2013   Influenza-Unspecified 02/07/2015   Pneumococcal Polysaccharide-23 12/28/2016    Past Medical History:  Diagnosis Date   Anxiety    Atrial fibrillation (HCC)    Atrial fibrillation ablation in 2019 - Dr. Kelsie   Chronic diastolic heart failure (HCC)    Depression    Essential hypertension    History of TIA (transient ischemic attack)    Hyperlipidemia    Hypothyroidism    Pneumonia     Tobacco History: Social History   Tobacco Use  Smoking Status Former   Current packs/day: 0.00   Types: Cigarettes   Quit date: 01/22/1973   Years since quitting: 50.7  Smokeless Tobacco Never   Counseling given: Not Answered   Outpatient Medications Prior to Visit  Medication Sig  Dispense Refill   amiodarone  (PACERONE ) 200 MG tablet TAKE 1 TABLET(200 MG) BY MOUTH DAILY 90 tablet 2   atorvastatin  (LIPITOR) 20 MG tablet Take 20 mg by mouth daily.     benzonatate (TESSALON) 200 MG capsule Take 200 mg by mouth 2 (two) times daily as needed.     dextromethorphan -guaiFENesin  (MUCINEX  DM) 30-600 MG 12hr tablet Take 1 tablet by mouth 2 (two) times daily.     diltiazem  (CARDIZEM  CD) 180 MG 24 hr capsule TAKE 1 CAPSULE(180 MG) BY MOUTH DAILY 90 capsule 3   docusate sodium  (COLACE) 50 MG capsule Take 50 mg by mouth 2 (two) times daily.     DULoxetine   (CYMBALTA ) 60 MG capsule Take 1 capsule (60 mg total) by mouth daily. 30 capsule 1   FARXIGA  10 MG TABS tablet TAKE 1 TABLET(10 MG) BY MOUTH DAILY BEFORE BREAKFAST 90 tablet 3   guaiFENesin  (ROBITUSSIN) 100 MG/5ML liquid Take 10 mLs by mouth every 6 (six) hours as needed for cough or to loosen phlegm. 300 mL 0   hydrALAZINE  (APRESOLINE ) 25 MG tablet Take 1 tablet (25 mg total) by mouth 2 (two) times daily. 60 tablet 1   ipratropium (ATROVENT ) 0.06 % nasal spray Place 2 sprays into both nostrils 3 (three) times daily. 15 mL 0   ipratropium-albuterol  (DUONEB) 0.5-2.5 (3) MG/3ML SOLN Take 3 mLs by nebulization every 6 (six) hours as needed. 360 mL 1   Iron-FA-B Cmp-C-Biot-Probiotic (FUSION PLUS) CAPS Take 1 capsule by mouth daily.     levothyroxine  (SYNTHROID ) 150 MCG tablet Take 150 mcg by mouth daily.     metoprolol  succinate (TOPROL  XL) 25 MG 24 hr tablet Take 1 tablet (25 mg total) by mouth daily. 90 tablet 2   potassium chloride  SA (KLOR-CON  M) 20 MEQ tablet TAKE 1 TABLET(20 MEQ) BY MOUTH DAILY 90 tablet 3   predniSONE  (DELTASONE ) 10 MG tablet Take 5 tablets (50 mg total) by mouth daily with breakfast. And decrease by one tablet daily 15 tablet 0   pregabalin  (LYRICA ) 75 MG capsule Take 1 capsule (75 mg total) by mouth at bedtime. For restless leg symptoms 30 capsule 5   rivaroxaban  (XARELTO ) 20 MG TABS tablet Take 1 tablet (20 mg total) by mouth daily with supper. 90 tablet 1   SYMBICORT 160-4.5 MCG/ACT inhaler Inhale 2 puffs into the lungs 2 (two) times daily.     torsemide  (DEMADEX ) 20 MG tablet Take 2 tablets (40 mg total) by mouth daily. 180 tablet 2   No facility-administered medications prior to visit.   Review of Systems  Review of Systems  Constitutional: Negative.   HENT: Negative.    Respiratory:  Positive for chest tightness. Negative for cough, shortness of breath and wheezing.    Physical Exam  There were no vitals taken for this visit. Physical Exam Constitutional:       Appearance: Normal appearance. She is well-developed.  HENT:     Head: Normocephalic and atraumatic.     Mouth/Throat:     Mouth: Mucous membranes are moist.     Pharynx: Oropharynx is clear.  Eyes:     Pupils: Pupils are equal, round, and reactive to light.  Cardiovascular:     Rate and Rhythm: Normal rate and regular rhythm.     Heart sounds: Normal heart sounds. No murmur heard. Pulmonary:     Effort: Pulmonary effort is normal. No respiratory distress.     Breath sounds: Normal breath sounds. No wheezing or rhonchi.  Abdominal:  General: Bowel sounds are normal.     Palpations: Abdomen is soft.     Tenderness: There is no abdominal tenderness.  Musculoskeletal:     Cervical back: Normal range of motion and neck supple.     Comments: Kyphosis and scoliosis of the spine   Skin:    General: Skin is warm and dry.     Findings: No erythema or rash.  Neurological:     General: No focal deficit present.     Mental Status: She is alert and oriented to person, place, and time. Mental status is at baseline.  Psychiatric:        Mood and Affect: Mood normal.        Behavior: Behavior normal.        Thought Content: Thought content normal.        Judgment: Judgment normal.       Lab Results:  CBC    Component Value Date/Time   WBC 8.6 08/18/2023 0415   RBC 3.76 (L) 08/18/2023 0415   HGB 12.1 08/18/2023 0415   HGB 12.4 01/20/2017 1538   HCT 38.3 08/18/2023 0415   HCT 39.5 01/20/2017 1538   PLT 225 08/18/2023 0415   PLT 299 01/20/2017 1538   MCV 101.9 (H) 08/18/2023 0415   MCV 91 01/20/2017 1538   MCH 32.2 08/18/2023 0415   MCHC 31.6 08/18/2023 0415   RDW 15.6 (H) 08/18/2023 0415   RDW 19.2 (H) 01/20/2017 1538   LYMPHSABS 0.4 (L) 08/15/2023 0324   LYMPHSABS 1.2 01/20/2017 1538   MONOABS 0.3 08/15/2023 0324   EOSABS 0.0 08/15/2023 0324   EOSABS 0.1 01/20/2017 1538   BASOSABS 0.0 08/15/2023 0324   BASOSABS 0.0 01/20/2017 1538    BMET    Component Value  Date/Time   NA 141 08/20/2023 0409   NA 142 01/20/2017 1538   K 3.9 08/20/2023 0409   CL 97 (L) 08/20/2023 0409   CO2 33 (H) 08/20/2023 0409   GLUCOSE 105 (H) 08/20/2023 0409   BUN 37 (H) 08/20/2023 0409   BUN 14 01/20/2017 1538   CREATININE 1.04 (H) 08/20/2023 0409   CREATININE 0.94 (H) 12/22/2018 1313   CALCIUM  9.4 08/20/2023 0409   GFRNONAA 55 (L) 08/20/2023 0409   GFRAA 48 (L) 12/01/2019 1506    BNP    Component Value Date/Time   BNP 114.0 (H) 08/19/2023 0412    ProBNP    Component Value Date/Time   PROBNP 1,859.0 (H) 12/29/2013 0520    Imaging: DG Shoulder Right Result Date: 09/17/2023 CLINICAL DATA:  pain in right shoulder; other chronic pain EXAM: RIGHT SHOULDER - 2+ VIEW COMPARISON:  March 17, 2021 FINDINGS: Osteopenia.No acute fracture or dislocation. Progressive joint space loss of the glenohumeral joint with near bone-on-bone articulation. Soft tissues are unremarkable. IMPRESSION: Osteopenia. Worsening osteoarthritis of the glenohumeral joint with near bone-on-bone articulation. Electronically Signed   By: Rogelia Myers M.D.   On: 09/17/2023 20:11     Assessment & Plan:  1. Pneumonia of right upper lobe due to infectious organism  2. Nocturnal hypoxia (Primary)  3. Restrictive lung disease  4. Restless leg syndrome  Assessment & Plan  Assessment and Plan    Right upper lobe pneumonia Right upper lobe pneumonia initially diagnosed in June. Symptoms have improved with no current cough, sputum production, or dyspnea. -Chest x-ray today shows resolution of previous pneumonia.   Chronic bronchitis  Chronic bronchitis with recurrent episodes. Former smoker, quit in 1974. Last exacerbation was in May  managed with prednisone , Augmentin , and guaifenesin . Current symptoms include occasional chest tightness relieved by nebulizer use. No mucus production or purulence. Pulmonary function tests in July not consistent with COPD, indicating mild restrictive lung  disease likely due to scoliosis. - Continue Symbicort 160mcg twice daily - Use nebulizer as needed for breakthrough symptoms - Continue oxygen  therapy at night and during exertion to maintain O2 saturation above 88%  Mild restrictive lung disease due to scoliosis Mild restrictive lung disease attributed to scoliosis. Lung function tests show mild reduction, likely due to spinal curvature. No significant impact on current respiratory function. No back pain reported. Surgical intervention not recommended due to mild nature and potential risks of surgery. Discussed that spinal surgeries often have difficult recoveries and may not lead to good outcomes. - Discuss potential non-surgical options with primary care or spine specialist if desired  Restless legs syndrome (periodic limb movement disorder) Restless legs syndrome managed with Lyrica . Current dose of 75 mg may be suboptimal as symptoms are returning at night.  - Increase Lyrica  to 150 mg if current dose is suboptimal - Monitor for increased daytime sleepiness and adjust dose if necessary   Almarie LELON Ferrari, NP 10/12/2023

## 2023-10-13 DIAGNOSIS — M25511 Pain in right shoulder: Secondary | ICD-10-CM | POA: Diagnosis not present

## 2023-10-14 ENCOUNTER — Other Ambulatory Visit: Payer: Self-pay | Admitting: *Deleted

## 2023-10-14 NOTE — Patient Outreach (Signed)
 Complex Care Management   Visit Note  12/16/2023 update for 10/14/23  Name:  Diane Cohen MRN: [id number] DOB: 22-Apr-1945  Situation: Referral received for Complex Care Management related to COPD I obtained verbal consent from Patient.  Visit completed with [name]  on the phone  Follow up with [name]  She confirms her symptoms from 10/07/23 have been resolved She had an injection on 10/13/23 for her shoulder, had a headache afterwards but Tylenol  resolved it. Had a few headaches prior to the injection but all resolved with Tylenol    Background:   Past Medical History:  Diagnosis Date   Anxiety    Atrial fibrillation (HCC)    Atrial fibrillation ablation in 2019 - Dr. Kelsie   Chronic diastolic heart failure (HCC)    Depression    Essential hypertension    History of TIA (transient ischemic attack)    Hyperlipidemia    Hypothyroidism    Pneumonia     Assessment: Patient Reported Symptoms:  Cognitive Cognitive Status: No symptoms reported, Alert and oriented to person, place, and time, Insightful and able to interpret abstract concepts, Normal speech and language skills Cognitive/Intellectual Conditions Management [RPT]: None reported or documented in medical history or problem list      Neurological Neurological Review of Symptoms: No symptoms reported Neurological Self-Management Outcome: 4 (good)  HEENT HEENT Symptoms Reported: No symptoms reported HEENT Self-Management Outcome: 4 (good)    Cardiovascular Cardiovascular Symptoms Reported: Swelling in legs or feet, Fatigue Cardiovascular Self-Management Outcome: 3 (uncertain)  Respiratory Respiratory Symptoms Reported: No symptoms reported Respiratory Self-Management Outcome: 4 (good)  Endocrine Endocrine Symptoms Reported: No symptoms reported Endocrine Self-Management Outcome: 4 (good)  Gastrointestinal Gastrointestinal Symptoms Reported: No symptoms reported Gastrointestinal Self-Management Outcome: 4  (good)    Genitourinary Genitourinary Symptoms Reported: No symptoms reported Genitourinary Self-Management Outcome: 4 (good)  Integumentary Integumentary Symptoms Reported: No symptoms reported Skin Self-Management Outcome: 4 (good)  Musculoskeletal Musculoskelatal Symptoms Reviewed: Joint pain Additional Musculoskeletal Details: pain improved after injection of steroid Musculoskeletal Management Strategies: Medical device, Medication therapy, Routine screening Musculoskeletal Self-Management Outcome: 4 (good)      Psychosocial Psychosocial Symptoms Reported: No symptoms reported Behavioral Management Strategies: Support system Behavioral Health Self-Management Outcome: 4 (good) Major Change/Loss/Stressor/Fears (CP): Medical condition, self Quality of Family Relationships: helpful, involved, supportive Do you feel physically threatened by others?: No      10/07/2023    4:49 PM  Depression screen PHQ 2/9  Decreased Interest 0  Down, Depressed, Hopeless 0  PHQ - 2 Score 0    There were no vitals filed for this visit.  Medications Reviewed Today   Medications were not reviewed in this encounter     Recommendation:   PCP Follow-up Continue Current Plan of Care  Follow Up Plan:   Telephone follow up appointment date/time:  11/15/23 1:15 pm  Suzen L. Ramonita, RN, BSN, CCM [place]  [facility], [facility] RN Care Manager Direct Dial: [phone]  Fax: [phone]

## 2023-10-21 ENCOUNTER — Telehealth: Payer: Self-pay

## 2023-10-21 NOTE — Telephone Encounter (Signed)
 Copied from CRM [id number]. Topic: Clinical - Prescription Issue >> Oct 21, 2023  8:19 AM Joesph PARAS wrote: Reason for CRM: Patient is calling to state that she would like to go down a step for her pregabalin (LYRICA) 150 MG capsule, as the current dosage is too strong. States same pharmacy as usually.

## 2023-10-21 NOTE — Telephone Encounter (Signed)
 Copied from CRM [id number]. Topic: Clinical - Prescription Issue >> Oct 19, 2023  4:43 PM Rilla B wrote: Reason for CRM: Caller states E Hope increase her Lyrica  med from 75mg  to 150mg ... patient states its too strong and would like it decreased.  Please call patient @ [phone]   ----------------------------------------------------------------------- From previous Reason for Contact - Other: Caller states E Walsh increase her Berry from 75mg  to 150mg ... patient states too strong. Would like to have it decreased.  [phone]    Please advise

## 2023-10-22 ENCOUNTER — Encounter: Payer: Self-pay | Admitting: *Deleted

## 2023-10-22 NOTE — Patient Outreach (Signed)
 Follow up message to patient - thyroid  monitoring  Updated patient on pcp response - to get updated thyroid  labs at next visit for concerns with thyroid  symptoms Left a voice message  Suzen L. Ramonita, RN, BSN, CCM [place]  [facility], [facility] RN Care Manager Direct Dial: [phone]  Fax: [phone]

## 2023-10-22 NOTE — Patient Instructions (Addendum)
 Visit Information  Thank you for taking time to visit with me today. Please don't hesitate to contact me if I can be of assistance to you before our next scheduled appointment.  Your next care management appointment is by telephone on 11/15/23 at 1315  ?Outreached to pcp/staff to report worsening symptoms related to hypothyroidism and advise for treatment plan ?10/22/23 updated patient on pcp response to have her come in for further thyroid  labs    Please call the care guide team at [phone] if you need to cancel, schedule, or reschedule an appointment.   Please call the Suicide and Crisis Lifeline: 988 call the [place]  National Suicide Prevention Lifeline: 442-648-8398 or TTY: 214-757-3718 TTY (240) 577-1508) to talk to a trained counselor call 1-800-273-TALK (toll free, 24 hour hotline) call the [facility] ([facility]): [phone] call 911 if you are experiencing a Mental Health or Behavioral Health Crisis or need someone to talk to.  Alonnah Lampkins L. Ramonita, RN, BSN, CCM [place]  [facility], [facility] RN Care Manager Direct Dial: [phone]  Fax: [phone]

## 2023-10-25 ENCOUNTER — Telehealth: Payer: Self-pay

## 2023-10-25 MED ORDER — PREGABALIN 75 MG PO CAPS
75.0000 mg | ORAL_CAPSULE | Freq: Every day | ORAL | 2 refills | Status: AC
Start: 2023-10-25 — End: ?

## 2023-10-25 NOTE — Telephone Encounter (Signed)
 Called and spoke with the pt and notified of response from [facility] She does want to go back on the Lyrica  75 mg dose  I removed the 150 mg dose from her [facility]  [name], please send new rx- I have pended, thanks!

## 2023-10-25 NOTE — Telephone Encounter (Signed)
 Done

## 2023-10-25 NOTE — Telephone Encounter (Signed)
 There is a 100mg  dose of Lyrica  we can try or does she want to go back to 75mg 

## 2023-10-26 ENCOUNTER — Ambulatory Visit
Admission: EM | Admit: 2023-10-26 | Discharge: 2023-10-26 | Disposition: A | Discharge status: Home / Self Care | Attending: Nurse Practitioner | Admitting: Nurse Practitioner

## 2023-10-26 ENCOUNTER — Encounter: Payer: Self-pay | Admitting: Emergency Medicine

## 2023-10-26 ENCOUNTER — Ambulatory Visit (INDEPENDENT_AMBULATORY_CARE_PROVIDER_SITE_OTHER)

## 2023-10-26 ENCOUNTER — Other Ambulatory Visit: Payer: Self-pay

## 2023-10-26 DIAGNOSIS — M40205 Unspecified kyphosis, thoracolumbar region: Secondary | ICD-10-CM | POA: Diagnosis not present

## 2023-10-26 DIAGNOSIS — R059 Cough, unspecified: Secondary | ICD-10-CM

## 2023-10-26 DIAGNOSIS — J029 Acute pharyngitis, unspecified: Secondary | ICD-10-CM | POA: Diagnosis not present

## 2023-10-26 DIAGNOSIS — H6591 Unspecified nonsuppurative otitis media, right ear: Secondary | ICD-10-CM | POA: Diagnosis not present

## 2023-10-26 DIAGNOSIS — J22 Unspecified acute lower respiratory infection: Secondary | ICD-10-CM | POA: Diagnosis not present

## 2023-10-26 DIAGNOSIS — Z8709 Personal history of other diseases of the respiratory system: Secondary | ICD-10-CM | POA: Diagnosis not present

## 2023-10-26 LAB — POCT RAPID STREP A (OFFICE): Rapid Strep A Screen: NEGATIVE

## 2023-10-26 LAB — POC COVID19/FLU A&B COMBO
Covid Antigen, POC: NEGATIVE
Influenza A Antigen, POC: NEGATIVE
Influenza B Antigen, POC: NEGATIVE

## 2023-10-26 MED ORDER — FLUTICASONE PROPIONATE 50 MCG/ACT NA SUSP: 2.0000 | Freq: Every day | NASAL | 0 refills | Status: DC

## 2023-10-26 MED ORDER — GUAIFENESIN 100 MG/5ML PO LIQD: 10.0000 mL | ORAL | 0 refills | Status: DC | PRN

## 2023-10-26 MED ORDER — AMOXICILLIN-POT CLAVULANATE 875-125 MG PO TABS: 1.0000 | ORAL_TABLET | Freq: Two times a day (BID) | ORAL | 0 refills | Status: DC

## 2023-10-26 NOTE — Discharge Instructions (Signed)
 The rapid strep test and COVID/flu test were negative. Take medication as prescribed. Increase fluids and allow for plenty of rest. You may take over-the-counter Tylenol  as needed for pain, fever, or general discomfort. Recommend warm salt water  gargles 3-4 times daily as needed for throat pain or discomfort.  You may also use over-the-counter Chloraseptic throat spray or throat lozenges while symptoms persist. Recommend use of a humidifier in your bedroom at nighttime during sleep and sleeping elevated on pillows while cough symptoms persist. Continue use of your albuterol  inhaler and nebulizer treatments at home. If symptoms fail to improve, recommend follow-up with pulmonology for worsening cough. Follow-up as needed.

## 2023-10-26 NOTE — ED Triage Notes (Addendum)
 Pt reports sore throat, cough, intermittent chills, body aches since Sunday. Pt reports has tried mucinex ,intermittent breathing treatments,nasal spray, and inhalers with some relief or symptoms.

## 2023-10-26 NOTE — ED Provider Notes (Signed)
 [facility]    CSN: [id number] Arrival date & time: 10/26/23  1553      History   Chief Complaint Chief Complaint  Patient presents with   Sore Throat    HPI Diane Cohen is a 78 y.o. female.   The history is provided by the patient.   Patient presents with 2-day history of sore throat, cough, chills, body aches, wheezing, and shortness of breath.  Patient states that she also had a low-grade temperature at home.  She states that she has not had any headache, ear pain, runny nose, chest pain, abdominal pain, nausea, vomiting, diarrhea, or rash.  Patient states that she is coughing but cannot get anything up.  States so far she has used over-the-counter Mucinex , breathing treatments, nasal spray, and Tessalon for her symptoms.  States that her granddaughter in law has been sick with a cough recently.  Patient reports history of recurrent pneumonia in the past, chart also indicates prior history of COPD.  Past Medical History:  Diagnosis Date   Anxiety    Atrial fibrillation (HCC)    Atrial fibrillation ablation in 2019 - Dr. Kelsie   Chronic diastolic heart failure [facility])    Depression    Essential hypertension    History of TIA (transient ischemic attack)    Hyperlipidemia    Hypothyroidism    Pneumonia     Patient Active Problem List   Diagnosis Date Noted   Pain in right shoulder 09/27/2023   Acute heart failure (HCC) 08/14/2023   CKD stage 3a, GFR 45-59 ml/min (HCC) 08/14/2023   Right upper lobe pneumonia 08/14/2023   Obesity, class 1 08/14/2023   Bronchitis 08/11/2023   Acute renal failure superimposed on stage 3b chronic kidney disease (HCC) 06/11/2023   Acute renal failure with acute tubular necrosis superimposed on stage 3a chronic kidney disease (HCC) 06/11/2023   COPD with acute exacerbation (HCC) 06/09/2023   Multifocal pneumonia 06/05/2023   Pneumonia 06/04/2023   Periodic limb movement disorder 05/31/2023   Scalp laceration 10/27/2022    Snoring 08/19/2022   Hyperthyroidism 07/02/2022   Abnormal mammogram 01/13/2022   Mass of left breast 01/13/2022   Osteopenia 01/13/2022   Abnormal renal function 12/17/2021   Bilateral cataracts 12/17/2021   Bradycardia 12/17/2021   Diastolic dysfunction 12/17/2021   Hardening of the aorta (main artery of the heart) (HCC) 12/17/2021   Impaired fasting glucose 12/17/2021   Mixed anxiety and depressive disorder 12/17/2021   Mixed hyperlipidemia 12/17/2021   Morbid obesity (HCC) 12/17/2021   Multiple nodules of lung 12/17/2021   Osteoarthritis 12/17/2021   Vitamin D deficiency 12/17/2021   Acute bilateral low back pain without sciatica    Atrial fibrillation with rapid ventricular response (HCC) 06/23/2019   History of colonic polyps 12/13/2018   Constipation 12/13/2018   Abdominal pain, epigastric 12/13/2018   History of hiatal hernia 05/12/2018   Encounter for screening colonoscopy 05/12/2018   Acute on chronic diastolic CHF (congestive heart failure) (HCC) 04/20/2018   Swelling of limb 11/09/2017   Chronic diastolic heart failure (HCC) 12/29/2013   Anemia 09/20/2013   Bilateral leg edema 08/29/2013   Essential hypertension    Paroxysmal atrial fibrillation (HCC)     Past Surgical History:  Procedure Laterality Date   ABDOMINAL HYSTERECTOMY     ABLATION OF DYSRHYTHMIC FOCUS  07/06/2017   ATRIAL FIBRILLATION ABLATION N/A 07/06/2017   Procedure: ATRIAL FIBRILLATION ABLATION;  Surgeon: Kelsie Agent, MD;  Location: [facility];  Service: Cardiovascular;  Laterality: N/A;   BREAST SURGERY     biopsy   CARDIOVERSION N/A 01/23/2016   Procedure: CARDIOVERSION;  Surgeon: Jayson KANDICE Sierras, MD;  Location: [facility];  Service: Cardiovascular;  Laterality: N/A;   CARDIOVERSION N/A 11/13/2016   Procedure: CARDIOVERSION;  Surgeon: Alvan Dorn FALCON, MD;  Location: [facility];  Service: Endoscopy;  Laterality: N/A;   CARDIOVERSION N/A 04/20/2018   Procedure: CARDIOVERSION;   Surgeon: Loni Soyla LABOR, MD;  Location: [facility] ENDOSCOPY;  Service: Cardiovascular;  Laterality: N/A;   CARDIOVERSION N/A 05/23/2020   Procedure: CARDIOVERSION;  Surgeon: Sierras Jayson KANDICE, MD;  Location: [facility];  Service: Cardiovascular;  Laterality: N/A;   CATARACT EXTRACTION W/PHACO Left 11/17/2021   Procedure: CATARACT EXTRACTION PHACO AND INTRAOCULAR LENS PLACEMENT (IOC);  Surgeon: Harrie Agent, MD;  Location: [facility]S;  Service: Ophthalmology;  Laterality: Left;  CDE:9.47   CATARACT EXTRACTION W/PHACO Right 12/01/2021   Procedure: CATARACT EXTRACTION PHACO AND INTRAOCULAR LENS PLACEMENT (IOC);  Surgeon: Harrie Agent, MD;  Location: [facility] ORS;  Service: Ophthalmology;  Laterality: Right;  CDE: 6.91   CHOLECYSTECTOMY     COLONOSCOPY N/A 10/07/2018   Sigmoid and descending colon diverticulosis, three 6 to 15 mm polyps in rectum, ascending colon, and IC valve, one 6 mm polyp in cecum. (sessile serrated polyps and sessile serrated adenoma of ascending colon with focal high grade dysplasia.   coloscopy     ESOPHAGEAL DILATION  10/07/2018   Procedure: ESOPHAGEAL DILATION;  Surgeon: Shaaron Lamar HERO, MD;  [facility]: [facility];  Service: Endoscopy;;   ESOPHAGOGASTRODUODENOSCOPY N/A 10/07/2018   normal esophagus s/p dilation   POLYPECTOMY  10/07/2018   Procedure: POLYPECTOMY;  Surgeon: Shaaron Lamar HERO, MD;  Location: [facility];  Service: Endoscopy;;  Cecal polyps x 2 hot snare Ascending polyp x 1 Hot snare Rectal polyp x 1   TEE WITHOUT CARDIOVERSION  07/06/2017   TEE WITHOUT CARDIOVERSION N/A 07/06/2017   Procedure: TRANSESOPHAGEAL ECHOCARDIOGRAM (TEE);  Surgeon: Delford Maude BROCKS, MD;  Location: [facility] ENDOSCOPY;  Service: Cardiovascular;  Laterality: N/A;    OB History   No obstetric history on file.      Home Medications    Prior to Admission medications   Medication Sig Start Date End Date Taking? Authorizing Provider  amoxicillin -clavulanate (AUGMENTIN ) 875-125 MG tablet Take 1  tablet by mouth every 12 (twelve) hours. 10/26/23  Yes Leath-Warren, Etta PARAS, NP  fluticasone  (FLONASE ) 50 MCG/ACT nasal spray Place 2 sprays into both nostrils daily. 10/26/23  Yes Leath-Warren, Etta PARAS, NP  guaiFENesin  (ROBITUSSIN) 100 MG/5ML liquid Take 10 mLs by mouth every 4 (four) hours as needed for cough or to loosen phlegm. 10/26/23  Yes Leath-Warren, Etta PARAS, NP  amiodarone  (PACERONE ) 200 MG tablet TAKE 1 TABLET(200 MG) BY MOUTH DAILY 09/23/23   Sierras Jayson KANDICE, MD  atorvastatin  (LIPITOR) 20 MG tablet Take 20 mg by mouth daily. 05/10/22   [provider]  benzonatate (TESSALON) 200 MG capsule Take 200 mg by mouth 2 (two) times daily as needed. 08/11/23   [provider]  dextromethorphan -guaiFENesin  (MUCINEX  DM) 30-600 MG 12hr tablet Take 1 tablet by mouth 2 (two) times daily.    [provider]  diltiazem  (CARDIZEM  CD) 180 MG 24 hr capsule TAKE 1 CAPSULE(180 MG) BY MOUTH DAILY 10/13/23   Sierras Jayson KANDICE, MD  docusate sodium  (COLACE) 50 MG capsule Take 50 mg by mouth 2 (two) times daily.    [provider]  DULoxetine  (CYMBALTA ) 60 MG capsule  Take 1 capsule (60 mg total) by mouth daily. 06/24/19   Ricky Fines, MD  FARXIGA  10 MG TABS tablet TAKE 1 TABLET(10 MG) BY MOUTH DAILY BEFORE BREAKFAST 08/03/23   Debera Jayson MATSU, MD  hydrALAZINE  (APRESOLINE ) 25 MG tablet Take 1 tablet (25 mg total) by mouth 2 (two) times daily. 08/20/23   Evonnie Lenis, MD  ipratropium (ATROVENT ) 0.06 % nasal spray Place 2 sprays into both nostrils 3 (three) times daily. 06/12/23   Evonnie Lenis, MD  ipratropium-albuterol  (DUONEB) 0.5-2.5 (3) MG/3ML SOLN Take 3 mLs by nebulization every 6 (six) hours as needed. 06/12/23   Evonnie Lenis, MD  Iron-FA-B Cmp-C-Biot-Probiotic (FUSION PLUS) CAPS Take 1 capsule by mouth daily. 12/01/22   [provider]  levothyroxine  (SYNTHROID ) 150 MCG tablet Take 150 mcg by mouth daily. 08/05/23   [provider]  metoprolol  succinate (TOPROL   XL) 25 MG 24 hr tablet Take 1 tablet (25 mg total) by mouth daily. 05/31/23   Debera Jayson MATSU, MD  potassium chloride  SA (KLOR-CON  M) 20 MEQ tablet TAKE 1 TABLET(20 MEQ) BY MOUTH DAILY 01/06/23   Debera Jayson MATSU, MD  predniSONE  (DELTASONE ) 10 MG tablet Take 5 tablets (50 mg total) by mouth daily with breakfast. And decrease by one tablet daily 08/21/23   Tat, Lenis, MD  pregabalin  (LYRICA ) 75 MG capsule Take 1 capsule (75 mg total) by mouth daily. 10/25/23   Hope Almarie ORN, NP  rivaroxaban  (XARELTO ) 20 MG TABS tablet Take 1 tablet (20 mg total) by mouth daily with supper. 09/06/23   Debera Jayson MATSU, MD  SYMBICORT 160-4.5 MCG/ACT inhaler Inhale 2 puffs into the lungs 2 (two) times daily. 08/12/23   [provider]  torsemide  (DEMADEX ) 20 MG tablet Take 2 tablets (40 mg total) by mouth daily. 09/20/23   Debera Jayson MATSU, MD    Family History Family History  Problem Relation Age of Onset   Hypertension Other    Hypertension Father    Hypertension Mother    Anemia Sister    Hypertension Sister    Colon cancer Neg Hx    Colon polyps Neg Hx     Social History Social History   Tobacco Use   Smoking status: Former    Current packs/day: 0.00    Types: Cigarettes    Quit date: 01/22/1973    Years since quitting: 50.7   Smokeless tobacco: Never  Vaping Use   Vaping status: Never Used  Substance Use Topics   Alcohol use: Not Currently    Alcohol/week: 0.0 standard drinks of alcohol    Comment: 3 times a year: Christmas, Thanksgiving, and maybe just because    Drug use: No     Allergies   Levofloxacin, Misc. sulfonamide containing compounds, Sulfa antibiotics, and Sulfamethoxazole   Review of Systems Review of Systems Per HPI  Physical Exam Triage Vital Signs ED Triage Vitals  Encounter Vitals Group     BP 10/26/23 1605 121/76     Girls Systolic BP Percentile --      Girls Diastolic BP Percentile --      Boys Systolic BP Percentile --      Boys Diastolic  BP Percentile --      Pulse Rate 10/26/23 1605 80     Resp 10/26/23 1605 20     Temp 10/26/23 1605 99.1 F (37.3 C)     Temp Source 10/26/23 1605 Oral     SpO2 10/26/23 1605 94 %     Weight --  Height --      Head Circumference --      Peak Flow --      Pain Score 10/26/23 1603 2     Pain Loc --      Pain Education --      Exclude from Growth Chart --    No data found.  Updated Vital Signs BP 121/76 (BP Location: Right Arm)   Pulse 80   Temp 99.1 F (37.3 C) (Oral)   Resp 20   SpO2 94%   Visual Acuity Right Eye Distance:   Left Eye Distance:   Bilateral Distance:    Right Eye Near:   Left Eye Near:    Bilateral Near:     Physical Exam Vitals and nursing note reviewed.  Constitutional:      General: She is not in acute distress.    Appearance: She is well-developed.  HENT:     Head: Normocephalic.     Right Ear: Hearing, ear canal and external ear normal. A middle ear effusion is present. Tympanic membrane is bulging.     Left Ear: Hearing, ear canal and external ear normal. Tympanic membrane is erythematous.     Nose: Congestion present.     Right Turbinates: Enlarged and swollen.     Left Turbinates: Enlarged and swollen.     Right Sinus: No maxillary sinus tenderness or frontal sinus tenderness.     Left Sinus: No maxillary sinus tenderness or frontal sinus tenderness.     Mouth/Throat:     Lips: Pink.     Mouth: Mucous membranes are moist.     Pharynx: Posterior oropharyngeal erythema and postnasal drip present. No pharyngeal swelling, oropharyngeal exudate or uvula swelling.     Comments: Cobblestoning present to posterior oropharynx  Eyes:     Extraocular Movements: Extraocular movements intact.     Conjunctiva/sclera: Conjunctivae normal.     Pupils: Pupils are equal, round, and reactive to light.  Cardiovascular:     Rate and Rhythm: Normal rate and regular rhythm.     Pulses: Normal pulses.     Heart sounds: Normal heart sounds.  Pulmonary:      Effort: Pulmonary effort is normal. No respiratory distress.     Breath sounds: Normal breath sounds. No stridor. No wheezing, rhonchi or rales.  Abdominal:     General: Bowel sounds are normal.     Palpations: Abdomen is soft.     Tenderness: There is no abdominal tenderness.  Musculoskeletal:     Cervical back: Normal range of motion.  Skin:    General: Skin is warm and dry.  Neurological:     General: No focal deficit present.     Mental Status: She is alert and oriented to person, place, and time.  Psychiatric:        Mood and Affect: Mood normal.        Behavior: Behavior normal.      UC Treatments / Results  Labs (all labs ordered are listed, but only abnormal results are displayed) Labs Reviewed  POCT RAPID STREP A (OFFICE) - Normal  POC COVID19/FLU A&B COMBO - Normal    EKG   Radiology DG Chest 2 View Result Date: 10/26/2023 CLINICAL DATA:  Cough for 2 days. EXAM: CHEST - 2 VIEW COMPARISON:  October 12, 2023. FINDINGS: The heart size and mediastinal contours are within normal limits. Stable right midlung scarring is noted. No acute pulmonary disease is noted. S-shaped scoliosis of thoracic and lumbar spine is  noted with moderate thoracic kyphosis. IMPRESSION: No active cardiopulmonary disease. Electronically Signed   By: Lynwood Landy Raddle M.D.   On: 10/26/2023 16:45    Procedures Procedures (including critical care time)  Medications Ordered in UC Medications - No data to display  Initial Impression / Assessment and Plan / UC Course  I have reviewed the triage vital signs and the nursing notes.  Pertinent labs & imaging results that were available during my care of the patient were reviewed by me and considered in my medical decision making (see chart for details).  The rapid strep test and COVID/flu test were negative.  Will defer throat culture as patient is being treated for otitis media, which will also cover for possible strep.  Chest x-ray was also negative  for active cardiopulmonary disease, specifically pneumonia.  On exam, the patient's lung sounds are clear throughout, room air sats at 94%.  Patient does have some bulging and erythema of the right tympanic membrane, consistent with otitis media with effusion.  Will treat for right otitis media with Augmentin  875/125 mg tablets.  This will also cover for lower respiratory infection/COPD exacerbation..  Supportive care recommendations were provided discussed with the patient to include fluids, rest, warm salt water  gargles, and over-the-counter analgesics.  Patient advised to continue use of her inhaler and nebulizer as needed.  Recommend follow-up with pulmonology for worsening respiratory symptoms.  Patient was in agreement with this plan of care and verbalizes understanding.  All questions were answered.  Patient stable for discharge.  Final Clinical Impressions(s) / UC Diagnoses   Final diagnoses:  Cough, unspecified type  Right otitis media with effusion  Lower respiratory infection  Sore throat  History of COPD     Discharge Instructions      The rapid strep test and COVID/flu test were negative. Take medication as prescribed. Increase fluids and allow for plenty of rest. You may take over-the-counter Tylenol  as needed for pain, fever, or general discomfort. Recommend warm salt water  gargles 3-4 times daily as needed for throat pain or discomfort.  You may also use over-the-counter Chloraseptic throat spray or throat lozenges while symptoms persist. Recommend use of a humidifier in your bedroom at nighttime during sleep and sleeping elevated on pillows while cough symptoms persist. Continue use of your albuterol  inhaler and nebulizer treatments at home. If symptoms fail to improve, recommend follow-up with pulmonology for worsening cough. Follow-up as needed.     ED Prescriptions     Medication Sig Dispense Auth. Provider   amoxicillin -clavulanate (AUGMENTIN ) 875-125 MG tablet  Take 1 tablet by mouth every 12 (twelve) hours. 14 tablet Leath-Warren, Etta PARAS, NP   fluticasone  (FLONASE ) 50 MCG/ACT nasal spray Place 2 sprays into both nostrils daily. 16 g Leath-Warren, Etta PARAS, NP   guaiFENesin  (ROBITUSSIN) 100 MG/5ML liquid Take 10 mLs by mouth every 4 (four) hours as needed for cough or to loosen phlegm. 300 mL Leath-Warren, Etta PARAS, NP      PDMP not reviewed this encounter.   Gilmer Etta PARAS, NP 10/26/23 1702

## 2023-11-04 DIAGNOSIS — H35373 Puckering of macula, bilateral: Secondary | ICD-10-CM | POA: Diagnosis not present

## 2023-11-04 DIAGNOSIS — H524 Presbyopia: Secondary | ICD-10-CM | POA: Diagnosis not present

## 2023-11-04 DIAGNOSIS — H02834 Dermatochalasis of left upper eyelid: Secondary | ICD-10-CM | POA: Diagnosis not present

## 2023-11-04 DIAGNOSIS — H52223 Regular astigmatism, bilateral: Secondary | ICD-10-CM | POA: Diagnosis not present

## 2023-11-04 DIAGNOSIS — Z961 Presence of intraocular lens: Secondary | ICD-10-CM | POA: Diagnosis not present

## 2023-11-04 DIAGNOSIS — H02831 Dermatochalasis of right upper eyelid: Secondary | ICD-10-CM | POA: Diagnosis not present

## 2023-11-15 ENCOUNTER — Telehealth: Payer: Self-pay | Admitting: *Deleted

## 2023-11-15 DIAGNOSIS — J329 Chronic sinusitis, unspecified: Secondary | ICD-10-CM | POA: Diagnosis not present

## 2023-11-15 DIAGNOSIS — B9689 Other specified bacterial agents as the cause of diseases classified elsewhere: Secondary | ICD-10-CM | POA: Diagnosis not present

## 2023-11-25 DIAGNOSIS — E782 Mixed hyperlipidemia: Secondary | ICD-10-CM | POA: Diagnosis not present

## 2023-11-25 DIAGNOSIS — E039 Hypothyroidism, unspecified: Secondary | ICD-10-CM | POA: Diagnosis not present

## 2023-11-25 DIAGNOSIS — R7301 Impaired fasting glucose: Secondary | ICD-10-CM | POA: Diagnosis not present

## 2023-11-25 DIAGNOSIS — D649 Anemia, unspecified: Secondary | ICD-10-CM | POA: Diagnosis not present

## 2023-11-25 DIAGNOSIS — E559 Vitamin D deficiency, unspecified: Secondary | ICD-10-CM | POA: Diagnosis not present

## 2023-12-01 DIAGNOSIS — I480 Paroxysmal atrial fibrillation: Secondary | ICD-10-CM | POA: Diagnosis not present

## 2023-12-01 DIAGNOSIS — Z23 Encounter for immunization: Secondary | ICD-10-CM | POA: Diagnosis not present

## 2023-12-01 DIAGNOSIS — I110 Hypertensive heart disease with heart failure: Secondary | ICD-10-CM | POA: Diagnosis not present

## 2023-12-01 DIAGNOSIS — E039 Hypothyroidism, unspecified: Secondary | ICD-10-CM | POA: Diagnosis not present

## 2023-12-01 DIAGNOSIS — R944 Abnormal results of kidney function studies: Secondary | ICD-10-CM | POA: Diagnosis not present

## 2023-12-01 DIAGNOSIS — E782 Mixed hyperlipidemia: Secondary | ICD-10-CM | POA: Diagnosis not present

## 2023-12-01 DIAGNOSIS — I10 Essential (primary) hypertension: Secondary | ICD-10-CM | POA: Diagnosis not present

## 2023-12-01 DIAGNOSIS — G4761 Periodic limb movement disorder: Secondary | ICD-10-CM | POA: Diagnosis not present

## 2023-12-01 DIAGNOSIS — J984 Other disorders of lung: Secondary | ICD-10-CM | POA: Diagnosis not present

## 2023-12-01 DIAGNOSIS — R7301 Impaired fasting glucose: Secondary | ICD-10-CM | POA: Diagnosis not present

## 2023-12-15 ENCOUNTER — Encounter: Payer: Self-pay | Admitting: Cardiology

## 2023-12-15 ENCOUNTER — Ambulatory Visit: Attending: Cardiology | Admitting: Cardiology

## 2023-12-15 VITALS — BP 128/72 | HR 64 | Ht 61.0 in | Wt 162.8 lb

## 2023-12-15 DIAGNOSIS — I5032 Chronic diastolic (congestive) heart failure: Secondary | ICD-10-CM

## 2023-12-15 DIAGNOSIS — I4819 Other persistent atrial fibrillation: Secondary | ICD-10-CM

## 2023-12-15 DIAGNOSIS — I2510 Atherosclerotic heart disease of native coronary artery without angina pectoris: Secondary | ICD-10-CM | POA: Diagnosis not present

## 2023-12-15 MED ORDER — RIVAROXABAN 15 MG PO TABS: 15.0000 mg | ORAL_TABLET | Freq: Every day | ORAL | 1 refills | Status: AC

## 2023-12-15 MED ORDER — ATORVASTATIN CALCIUM 40 MG PO TABS: 40.0000 mg | ORAL_TABLET | Freq: Every day | ORAL | 3 refills | Status: AC

## 2023-12-15 NOTE — Progress Notes (Signed)
 Cardiology Office Note  Date: 12/15/2023   ID: Diane Cohen, Date Jul 17, 1945, MRN [id number]  History of Present Illness: Diane Cohen is a 78 y.o. female last seen in February.  She is here for a follow-up visit.  Reports no palpitations or chest pain.  Had a visit with Dr. Shona in September with lab work as noted below.  I reviewed her records, she was hospitalized in June with acute on chronic hypoxic respiratory failure in the setting of pneumonia with suspected COPD exacerbation and acute on chronic HFpEF.  No atrial fibrillation documented at that time.  She had a follow-up echocardiogram which is reviewed below.  We went over her medications which have been changed since our last encounter.  She is going to go home and look at her bottles to make sure that the list we have is accurate.  She does appear to be on amiodarone  200 mg daily as before, recent TSH and LFTs were normal.  She does not report any bleeding problems on Xarelto .  Weight has been stable without obvious progressive fluid fluid retention.  She is taking torsemide  40 mg daily at this point.  Physical Exam: VS:  BP 128/72 (BP Location: Left Arm)   Pulse 64   Ht 5' 1 (1.549 m)   Wt 162 lb 12.8 oz (73.8 kg)   SpO2 96%   BMI 30.76 kg/m , BMI Body mass index is 30.76 kg/m.  Wt Readings from Last 3 Encounters:  12/15/23 162 lb 12.8 oz (73.8 kg)  10/12/23 160 lb 3.2 oz (72.7 kg)  08/30/23 162 lb (73.5 kg)    General: Patient appears comfortable at rest. HEENT: Conjunctiva and lids normal. Neck: Supple, no elevated JVP or carotid bruits.. Lungs: Clear to auscultation, nonlabored breathing at rest. Cardiac: Regular rate and rhythm, no S3, 2/6 systolic murmur. Extremities: No pitting edema.  ECG:  An ECG dated 06/04/2023 was personally reviewed today and demonstrated:  Sinus rhythm with PACs and lead motion artifact.  Labwork: 08/14/2023: ALT 26; AST 34 08/18/2023: Hemoglobin 12.1; Platelets 225 08/19/2023: B  Natriuretic Peptide 114.0 08/20/2023: BUN 37; Creatinine, Ser 1.04; Magnesium  2.4; Potassium 3.9; Sodium 141  September 2025: Cholesterol 186, triglycerides 75, HDL 77, LDL 95, TSH 1.57, hemoglobin 13.5, platelet 301, BUN 17, creatinine 1.13, GFR 50, potassium 3.9, AST 27, ALT 22  Other Studies Reviewed Today:  Echocardiogram 08/14/2023:  1. Left ventricular ejection fraction, by estimation, is 60 to 65%. The  left ventricle has normal function. The left ventricle has no regional  wall motion abnormalities. There is mild left ventricular hypertrophy.  Left ventricular diastolic parameters  were normal.   2. Right ventricular systolic function is normal. The right ventricular  size is normal.   3. Left atrial size was mildly dilated.   4. The mitral valve is abnormal. Trivial mitral valve regurgitation. No  evidence of mitral stenosis.   5. Mean gradient 5 peak 10 mmhg but AVA 2.2 cm2. The aortic valve is  tricuspid. There is moderate calcification of the aortic valve. There is  moderate thickening of the aortic valve. Aortic valve regurgitation is not  visualized. Aortic valve sclerosis  is present, with no evidence of aortic valve stenosis.   6. The inferior vena cava is normal in size with greater than 50%  respiratory variability, suggesting right atrial pressure of 3 mmHg.   7. Cannot exclude a small PFO.   Assessment and Plan:  1.  Persistent atrial fibrillation with CHA2DS2-VASc score  of 6.  She is status post atrial fibrillation ablation in 2019 with subsequent recurrences requiring cardioversions, on treatment with amiodarone  200 mg daily, Cardizem  CD 180 mg daily, and Toprol -XL 25 mg daily.  She continues on Xarelto  20 mg daily for stroke prophylaxis, this will be reduced to 15 mg daily with follow-up creatinine clearance 48.  I asked her to check her medication bottles at home to make sure that our current [facility] matches.   2. HFpEF, LVEF 60 to 65% by echocardiogram in June.   Continue Farxiga  10 mg daily, Demadex  40 mg daily, and KCl 20 mEq daily.  Weight has been stable.   3.  Coronary calcium  score of 63 with mild nonobstructive coronary atherosclerosis by coronary CTA in October 2022.  Asymptomatic in terms of angina.  I asked her to increase Lipitor to 40 mg daily with recent LDL 95.  Disposition:  Follow up 6 months.  Signed, Jayson Diane Cohen, M.D., F.A.C.C. [facility] at [facility]

## 2023-12-15 NOTE — Patient Instructions (Addendum)
 Medication Instructions:   INCREASE Atorvastatin  to 40 mg daily    DECREASE Xarelto  to 15 mg daily  Labwork: None today  Testing/Procedures: None today  Follow-Up: 6 months  Any Other Special Instructions Will Be Listed Below (If Applicable).   Please call the office with current medication list [phone]  If you need a refill on your cardiac medications before your next appointment, please call your pharmacy.

## 2023-12-16 NOTE — Patient Instructions (Signed)
 Visit Information  Thank you for taking time to visit with me today. Please don't hesitate to contact me if I can be of assistance to you before our next scheduled appointment.  Your next care management appointment is by telephone on 11/15/23     Please call the care guide team at [phone] if you need to cancel, schedule, or reschedule an appointment.   Please  if you are experiencing a Mental Health or Behavioral Health Crisis or need someone to talk to.  Griselda Bramblett L. Ramonita, RN, BSN, CCM [place]  [facility], [facility] RN Care Manager Direct Dial: [phone]  Fax: [phone]

## 2023-12-28 ENCOUNTER — Encounter: Payer: Self-pay | Admitting: *Deleted

## 2023-12-28 DIAGNOSIS — M19011 Primary osteoarthritis, right shoulder: Secondary | ICD-10-CM | POA: Diagnosis not present

## 2023-12-28 DIAGNOSIS — M25511 Pain in right shoulder: Secondary | ICD-10-CM | POA: Diagnosis not present

## 2023-12-28 NOTE — Progress Notes (Signed)
 Updated medication list brought to office. Medications reconciled.

## 2023-12-30 NOTE — Telephone Encounter (Signed)
 nfn

## 2024-01-06 ENCOUNTER — Ambulatory Visit (INDEPENDENT_AMBULATORY_CARE_PROVIDER_SITE_OTHER): Admitting: Otolaryngology

## 2024-01-06 ENCOUNTER — Encounter (INDEPENDENT_AMBULATORY_CARE_PROVIDER_SITE_OTHER): Payer: Self-pay | Admitting: Otolaryngology

## 2024-01-06 VITALS — BP 153/80 | HR 72 | Temp 98.2°F | Ht 61.0 in | Wt 160.0 lb

## 2024-01-06 DIAGNOSIS — R0981 Nasal congestion: Secondary | ICD-10-CM | POA: Diagnosis not present

## 2024-01-06 DIAGNOSIS — J343 Hypertrophy of nasal turbinates: Secondary | ICD-10-CM | POA: Insufficient documentation

## 2024-01-06 DIAGNOSIS — Z87891 Personal history of nicotine dependence: Secondary | ICD-10-CM

## 2024-01-06 DIAGNOSIS — J310 Chronic rhinitis: Secondary | ICD-10-CM | POA: Insufficient documentation

## 2024-01-06 DIAGNOSIS — H6983 Other specified disorders of Eustachian tube, bilateral: Secondary | ICD-10-CM | POA: Diagnosis not present

## 2024-01-06 DIAGNOSIS — H903 Sensorineural hearing loss, bilateral: Secondary | ICD-10-CM | POA: Insufficient documentation

## 2024-01-06 NOTE — Progress Notes (Signed)
 CC: Sinus and ear congestion  Discussed the use of AI scribe software for clinical note transcription with the patient, who gave verbal consent to proceed.  History of Present Illness Diane Cohen is a 78 year old female who presents with sinus and ear congestion.   Her symptoms began in September, characterized by a 'stuffy' head and congestion in her nose and ears, affecting her 'whole head.'  During this period, she underwent two or three rounds of antibiotics and used nasal sprays, including Flonase , which provided some relief. However, she still occasionally feels congestion, particularly in her ears, though it is not as severe as before.  She denies any previous history of sinus or ear problems and has never had surgery in the ear, nose, or throat area. She also denies any known allergies, although she is uncertain if she might be developing them now.  She wears bilateral hearing aids due to long-standing hearing loss. She has not been using the nasal spray recently as her symptoms have improved.  No history of allergies affecting her in the past.   Past Medical History:  Diagnosis Date   Anxiety    Atrial fibrillation [facility])    Atrial fibrillation ablation in 2019 - Dr. Kelsie   Chronic diastolic heart failure (HCC)    Depression    Essential hypertension    History of TIA (transient ischemic attack)    Hyperlipidemia    Hypothyroidism    Pneumonia     Past Surgical History:  Procedure Laterality Date   ABDOMINAL HYSTERECTOMY     ABLATION OF DYSRHYTHMIC FOCUS  07/06/2017   ATRIAL FIBRILLATION ABLATION N/A 07/06/2017   Procedure: ATRIAL FIBRILLATION ABLATION;  Surgeon: Kelsie Agent, MD;  Location: [facility] INVAS[facility] LAB;  Service: Cardiovascular;  Laterality: N/A;   BREAST SURGERY     biopsy   CARDIOVERSION N/A 01/23/2016   Procedure: CARDIOVERSION;  Surgeon: Jayson KANDICE Sierras, MD;  Location: [facility] ENDO SUITE;  Service: Cardiovascular;  Laterality: N/A;   CARDIOVERSION N/A  11/13/2016   Procedure: CARDIOVERSION;  Surgeon: Alvan Dorn FALCON, MD;  Location: [facility] ENDO [facility];  Service: Endoscopy;  Laterality: N/A;   CARDIOVERSION N/A 04/20/2018   Procedure: CARDIOVERSION;  Surgeon: Loni Soyla LABOR, MD;  Location: [facility] ENDOSCOPY;  Service: Cardiovascular;  Laterality: N/A;   CARDIOVERSION N/A 05/23/2020   Procedure: CARDIOVERSION;  Surgeon: Sierras Jayson KANDICE, MD;  Location: [facility] ENDO [facility];  Service: Cardiovascular;  Laterality: N/A;   CATARACT EXTRACTION W/PHACO Left 11/17/2021   Procedure: CATARACT EXTRACTION PHACO AND INTRAOCULAR LENS PLACEMENT (IOC);  Surgeon: Harrie Agent, MD;  Location: [facility] ORS;  Service: Ophthalmology;  Laterality: Left;  CDE:9.47   CATARACT EXTRACTION W/PHACO Right 12/01/2021   Procedure: CATARACT EXTRACTION PHACO AND INTRAOCULAR LENS PLACEMENT (IOC);  Surgeon: Harrie Agent, MD;  Location: [facility] ORS;  Service: Ophthalmology;  Laterality: Right;  CDE: 6.91   CHOLECYSTECTOMY     COLONOSCOPY N/A 10/07/2018   Sigmoid and descending colon diverticulosis, three 6 to 15 mm polyps in rectum, ascending colon, and IC valve, one 6 mm polyp in cecum. (sessile serrated polyps and sessile serrated adenoma of ascending colon with focal high grade dysplasia.   coloscopy     ESOPHAGEAL DILATION  10/07/2018   Procedure: ESOPHAGEAL DILATION;  Surgeon: Shaaron Lamar HERO, MD;  Location: [facility];  Service: Endoscopy;;   ESOPHAGOGASTRODUODENOSCOPY N/A 10/07/2018   normal esophagus s/p dilation   POLYPECTOMY  10/07/2018   Procedure: POLYPECTOMY;  Surgeon: Shaaron Lamar HERO, MD;  Location: [facility] END[facility];  Service: Endoscopy;;  Cecal polyps x 2 hot snare Ascending polyp x 1 Hot snare Rectal polyp x 1   TEE WITHOUT CARDIOVERSION  07/06/2017   TEE WITHOUT CARDIOVERSION N/A 07/06/2017   Procedure: TRANSESOPHAGEAL ECHOCARDIOGRAM (TEE);  Surgeon: Delford Maude BROCKS, MD;  Location: [facility] ENDOSCOPY;  Service: Cardiovascular;  Laterality: N/A;    Family History  Problem Relation Age of  Onset   Hypertension Other    Hypertension Father    Hypertension Mother    Anemia Sister    Hypertension Sister    Colon cancer Neg Hx    Colon polyps Neg Hx     Social History:  reports that she quit smoking about 50 years ago. Her smoking use included cigarettes. She has never used smokeless tobacco. She reports that she does not currently use alcohol. She reports that she does not use drugs.  Allergies:  Allergies  Allergen Reactions   Levofloxacin Other (See Comments)    Headache  levofloxacin   Misc. Sulfonamide Containing Compounds Other (See Comments)   Sulfa Antibiotics Hives   Sulfamethoxazole Other (See Comments)    sulfamethoxazole    Prior to Admission medications   Medication Sig Start Date End Date Taking? Authorizing Provider  amiodarone  (PACERONE ) 200 MG tablet TAKE 1 TABLET(200 MG) BY MOUTH DAILY 09/23/23  Yes Debera Jayson MATSU, MD  atorvastatin  (LIPITOR) 40 MG tablet Take 1 tablet (40 mg total) by mouth daily. 12/15/23 03/14/24 Yes Debera Jayson MATSU, MD  benzonatate (TESSALON) 200 MG capsule Take 200 mg by mouth 2 (two) times daily as needed. 08/11/23  Yes [provider]  Cholecalciferol (VITAMIN D3) 50 MCG (2000 UT) capsule Take 2,000 Units by mouth daily.   Yes [provider]  Dextromethorphan  HBr (DELSYM  PO) Take 1 Dose by mouth every 12 (twelve) hours as needed.   Yes [provider]  dextromethorphan -guaiFENesin  (MUCINEX  DM) 30-600 MG 12hr tablet Take 1 tablet by mouth 2 (two) times daily.   Yes [provider]  diltiazem  (CARDIZEM  CD) 180 MG 24 hr capsule TAKE 1 CAPSULE(180 MG) BY MOUTH DAILY 10/13/23  Yes Debera Jayson MATSU, MD  docusate sodium  (COLACE) 100 MG capsule Take 300 mg by mouth daily as needed for mild constipation.   Yes [provider]  DULoxetine  (CYMBALTA ) 60 MG capsule Take 1 capsule (60 mg total) by mouth daily. 06/24/19  Yes Ricky Fines, MD  FARXIGA  10 MG TABS tablet TAKE 1 TABLET(10 MG) BY  MOUTH DAILY BEFORE BREAKFAST 08/03/23  Yes Debera Jayson MATSU, MD  fluticasone  (FLONASE ) 50 MCG/ACT nasal spray Place 2 sprays into both nostrils daily as needed for allergies or rhinitis.   Yes [provider]  hydrALAZINE  (APRESOLINE ) 25 MG tablet Take 1 tablet (25 mg total) by mouth 2 (two) times daily. 08/20/23  Yes Tat, Alm, MD  ipratropium (ATROVENT ) 0.06 % nasal spray Place 2 sprays into both nostrils 3 (three) times daily. 06/12/23  Yes Tat, Alm, MD  ipratropium-albuterol  (DUONEB) 0.5-2.5 (3) MG/3ML SOLN Take 3 mLs by nebulization every 6 (six) hours as needed. 06/12/23  Yes Tat, Alm, MD  Iron-FA-B Cmp-C-Biot-Probiotic (FUSION PLUS) CAPS Take 1 capsule by mouth daily. 12/01/22  Yes [provider]  levocetirizine (XYZAL) 5 MG tablet Take 5 mg by mouth daily. 12/03/23  Yes [provider]  levothyroxine  (SYNTHROID ) 150 MCG tablet Take 150 mcg by mouth daily. 08/05/23  Yes [provider]  magnesium  oxide (MAG-OX) 400 (240 Mg) MG tablet Take 800 mg by mouth daily.  Yes [provider]  metoprolol  succinate (TOPROL  XL) 25 MG 24 hr tablet Take 1 tablet (25 mg total) by mouth daily. 05/31/23  Yes Debera Jayson MATSU, MD  Multiple Vitamins-Minerals (CENTRUM SILVER 50+WOMEN) TABS Take 1 tablet by mouth daily.   Yes [provider]  potassium chloride  SA (KLOR-CON  M) 20 MEQ tablet TAKE 1 TABLET(20 MEQ) BY MOUTH DAILY 01/06/23  Yes Debera Jayson MATSU, MD  pregabalin  (LYRICA ) 75 MG capsule Take 1 capsule (75 mg total) by mouth daily. 10/25/23  Yes Hope Almarie ORN, NP  Rivaroxaban  (XARELTO ) 15 MG TABS tablet Take 1 tablet (15 mg total) by mouth daily. 12/15/23  Yes Debera Jayson MATSU, MD  SYMBICORT 160-4.5 MCG/ACT inhaler Inhale 2 puffs into the lungs 2 (two) times daily. 08/12/23  Yes [provider]  torsemide  (DEMADEX ) 20 MG tablet Take 2 tablets (40 mg total) by mouth daily. 09/20/23  Yes Debera Jayson MATSU, MD    Blood pressure (!) 153/80,  pulse 72, temperature 98.2 F (36.8 C), temperature source Oral, height 5' 1 (1.549 m), weight 160 lb (72.6 kg), SpO2 92%. Exam: General: Communicates without difficulty, well nourished, no acute distress. Head: Normocephalic, no evidence injury, no tenderness, facial buttresses intact without stepoff. Face/sinus: No tenderness to palpation and percussion. Facial movement is normal and symmetric. Eyes: PERRL, EOMI. No scleral icterus, conjunctivae clear. Neuro: CN II exam reveals vision grossly intact.  No nystagmus at any point of gaze. Ears: Auricles well formed without lesions.  Ear canals are intact without mass or lesion.  No erythema or edema is appreciated.  The TMs are intact without fluid. Nose: External evaluation reveals normal support and skin without lesions.  Dorsum is intact.  Anterior rhinoscopy reveals congested mucosa over anterior aspect of inferior turbinates and intact septum.  No purulence noted. Oral:  Oral cavity and oropharynx are intact, symmetric, without erythema or edema.  Mucosa is moist without lesions. Neck: Full range of motion without pain.  There is no significant lymphadenopathy.  No masses palpable.  Thyroid  bed within normal limits to palpation.  Parotid glands and submandibular glands equal bilaterally without mass.  Trachea is midline. Neuro:  CN 2-12 grossly intact.   Procedure:  Flexible Nasal Endoscopy: Description: Risks, benefits, and alternatives of flexible endoscopy were explained to the patient.  Specific mention was made of the risk of throat numbness with difficulty swallowing, possible bleeding from the nose and mouth, and pain from the procedure.  The patient gave oral consent to proceed.  The flexible scope was inserted into the right nasal cavity.  Endoscopy of the interior nasal cavity, superior, inferior, and middle meatus was performed. The sphenoid-ethmoid recess was examined. Edematous mucosa was noted.  No polyp, mass, or lesion was appreciated.  Olfactory cleft was clear.  Nasopharynx was clear.  Turbinates were hypertrophied but without mass.  The procedure was repeated on the contralateral side with similar findings.  The patient tolerated the procedure well.   Assessment and Plan Assessment & Plan Chronic rhinitis with nasal mucosal congestion and bilateral inferior turbinate hypertrophy. Nasal congestion with bilateral turbinate hypertrophy, likely due to seasonal allergies given recent high pollen levels. Symptoms began in September and have improved with Flonase  nasal spray. Examination reveals mild nasal congestion and hypertrophy of the turbinates, but no polyps or infections. Ears and eardrums appear normal. - Use Flonase  (fluticasone ) nasal spray, two sprays in each nostril once daily,  - Continue using the nasal spray if congestion persists. - Contact the office if  symptoms worsen or if there are any concerns.  Bilateral eustachian tube dysfunction. - Her ear canals, tympanic membranes, and middle ear spaces are otherwise normal. - Can sinew the use of Flonase  nasal spray. - Valsalva exercise as needed. - The patient is encouraged to call with any questions or concerns.       Dereck Agerton W Monique Gift 01/06/2024, 3:21 PM

## 2024-01-13 ENCOUNTER — Other Ambulatory Visit: Payer: Self-pay | Admitting: Cardiology

## 2024-01-18 DIAGNOSIS — R944 Abnormal results of kidney function studies: Secondary | ICD-10-CM | POA: Diagnosis not present

## 2024-01-18 DIAGNOSIS — J984 Other disorders of lung: Secondary | ICD-10-CM | POA: Diagnosis not present

## 2024-01-18 DIAGNOSIS — I110 Hypertensive heart disease with heart failure: Secondary | ICD-10-CM | POA: Diagnosis not present

## 2024-01-18 DIAGNOSIS — E039 Hypothyroidism, unspecified: Secondary | ICD-10-CM | POA: Diagnosis not present

## 2024-01-18 DIAGNOSIS — I5032 Chronic diastolic (congestive) heart failure: Secondary | ICD-10-CM | POA: Diagnosis not present

## 2024-01-18 DIAGNOSIS — Z6835 Body mass index (BMI) 35.0-35.9, adult: Secondary | ICD-10-CM | POA: Diagnosis not present

## 2024-01-18 DIAGNOSIS — I10 Essential (primary) hypertension: Secondary | ICD-10-CM | POA: Diagnosis not present

## 2024-01-18 DIAGNOSIS — Z713 Dietary counseling and surveillance: Secondary | ICD-10-CM | POA: Diagnosis not present

## 2024-01-18 DIAGNOSIS — I480 Paroxysmal atrial fibrillation: Secondary | ICD-10-CM | POA: Diagnosis not present

## 2024-01-27 ENCOUNTER — Other Ambulatory Visit: Payer: Self-pay | Admitting: Cardiology

## 2024-02-16 ENCOUNTER — Other Ambulatory Visit: Payer: Self-pay | Admitting: Primary Care

## 2024-03-16 NOTE — Patient Instructions (Signed)
 Harland KATHEE Somerset - I am sorry I was unable to reach you today for our scheduled appointment. I work with Shona, Norleen PEDLAR, MD and am calling to support your healthcare needs. Please contact me at [phone]  at your earliest convenience. I look forward to speaking with you soon.   Thank You, Hendricks Her RN, BSN  [facility] [facility] RN Case Manager   Direct [phone]

## 2024-03-26 ENCOUNTER — Emergency Department (HOSPITAL_COMMUNITY)

## 2024-03-26 ENCOUNTER — Inpatient Hospital Stay (HOSPITAL_COMMUNITY)
Admission: EM | Admit: 2024-03-26 | Discharge: 2024-04-01 | DRG: 193 | Disposition: A | Discharge status: Home / Self Care | Attending: Internal Medicine | Admitting: Internal Medicine

## 2024-03-26 ENCOUNTER — Encounter (HOSPITAL_COMMUNITY): Payer: Self-pay | Admitting: Emergency Medicine

## 2024-03-26 ENCOUNTER — Other Ambulatory Visit: Payer: Self-pay

## 2024-03-26 DIAGNOSIS — E876 Hypokalemia: Secondary | ICD-10-CM | POA: Diagnosis not present

## 2024-03-26 DIAGNOSIS — I130 Hypertensive heart and chronic kidney disease with heart failure and stage 1 through stage 4 chronic kidney disease, or unspecified chronic kidney disease: Secondary | ICD-10-CM | POA: Diagnosis present

## 2024-03-26 DIAGNOSIS — E785 Hyperlipidemia, unspecified: Secondary | ICD-10-CM | POA: Diagnosis present

## 2024-03-26 DIAGNOSIS — J441 Chronic obstructive pulmonary disease with (acute) exacerbation: Secondary | ICD-10-CM | POA: Diagnosis present

## 2024-03-26 DIAGNOSIS — I480 Paroxysmal atrial fibrillation: Secondary | ICD-10-CM | POA: Diagnosis present

## 2024-03-26 DIAGNOSIS — Z79899 Other long term (current) drug therapy: Secondary | ICD-10-CM | POA: Diagnosis not present

## 2024-03-26 DIAGNOSIS — Z8249 Family history of ischemic heart disease and other diseases of the circulatory system: Secondary | ICD-10-CM

## 2024-03-26 DIAGNOSIS — I160 Hypertensive urgency: Secondary | ICD-10-CM | POA: Diagnosis present

## 2024-03-26 DIAGNOSIS — N1831 Chronic kidney disease, stage 3a: Secondary | ICD-10-CM | POA: Diagnosis present

## 2024-03-26 DIAGNOSIS — Z882 Allergy status to sulfonamides status: Secondary | ICD-10-CM

## 2024-03-26 DIAGNOSIS — Z7984 Long term (current) use of oral hypoglycemic drugs: Secondary | ICD-10-CM

## 2024-03-26 DIAGNOSIS — Z9049 Acquired absence of other specified parts of digestive tract: Secondary | ICD-10-CM

## 2024-03-26 DIAGNOSIS — Z881 Allergy status to other antibiotic agents status: Secondary | ICD-10-CM

## 2024-03-26 DIAGNOSIS — Z6830 Body mass index (BMI) 30.0-30.9, adult: Secondary | ICD-10-CM

## 2024-03-26 DIAGNOSIS — Z87891 Personal history of nicotine dependence: Secondary | ICD-10-CM

## 2024-03-26 DIAGNOSIS — Z792 Long term (current) use of antibiotics: Secondary | ICD-10-CM

## 2024-03-26 DIAGNOSIS — Z7901 Long term (current) use of anticoagulants: Secondary | ICD-10-CM

## 2024-03-26 DIAGNOSIS — Z8673 Personal history of transient ischemic attack (TIA), and cerebral infarction without residual deficits: Secondary | ICD-10-CM | POA: Diagnosis not present

## 2024-03-26 DIAGNOSIS — E66811 Obesity, class 1: Secondary | ICD-10-CM | POA: Diagnosis present

## 2024-03-26 DIAGNOSIS — Z860100 Personal history of colon polyps, unspecified: Secondary | ICD-10-CM

## 2024-03-26 DIAGNOSIS — I5033 Acute on chronic diastolic (congestive) heart failure: Secondary | ICD-10-CM | POA: Diagnosis not present

## 2024-03-26 DIAGNOSIS — E872 Acidosis, unspecified: Secondary | ICD-10-CM | POA: Diagnosis present

## 2024-03-26 DIAGNOSIS — J9621 Acute and chronic respiratory failure with hypoxia: Secondary | ICD-10-CM | POA: Diagnosis present

## 2024-03-26 DIAGNOSIS — J101 Influenza due to other identified influenza virus with other respiratory manifestations: Secondary | ICD-10-CM | POA: Diagnosis present

## 2024-03-26 DIAGNOSIS — J440 Chronic obstructive pulmonary disease with acute lower respiratory infection: Secondary | ICD-10-CM | POA: Diagnosis present

## 2024-03-26 DIAGNOSIS — E039 Hypothyroidism, unspecified: Secondary | ICD-10-CM | POA: Diagnosis present

## 2024-03-26 DIAGNOSIS — Z79890 Hormone replacement therapy (postmenopausal): Secondary | ICD-10-CM | POA: Diagnosis not present

## 2024-03-26 DIAGNOSIS — Z7951 Long term (current) use of inhaled steroids: Secondary | ICD-10-CM | POA: Diagnosis not present

## 2024-03-26 DIAGNOSIS — I5032 Chronic diastolic (congestive) heart failure: Secondary | ICD-10-CM | POA: Diagnosis not present

## 2024-03-26 DIAGNOSIS — Z90710 Acquired absence of both cervix and uterus: Secondary | ICD-10-CM

## 2024-03-26 DIAGNOSIS — I10 Essential (primary) hypertension: Secondary | ICD-10-CM | POA: Diagnosis not present

## 2024-03-26 DIAGNOSIS — J1008 Influenza due to other identified influenza virus with other specified pneumonia: Secondary | ICD-10-CM | POA: Diagnosis present

## 2024-03-26 DIAGNOSIS — J181 Lobar pneumonia, unspecified organism: Secondary | ICD-10-CM | POA: Diagnosis present

## 2024-03-26 LAB — BASIC METABOLIC PANEL WITH GFR
Anion gap: 12 (ref 5–15)
BUN: 12 mg/dL (ref 8–23)
CO2: 25 mmol/L (ref 22–32)
Calcium: 9.1 mg/dL (ref 8.9–10.3)
Chloride: 100 mmol/L (ref 98–111)
Creatinine, Ser: 0.91 mg/dL (ref 0.44–1.00)
GFR, Estimated: >60 mL/min
Glucose, Bld: 100 mg/dL — ABNORMAL HIGH (ref 70–99)
Potassium: 3.2 mmol/L — ABNORMAL LOW (ref 3.5–5.1)
Sodium: 137 mmol/L (ref 135–145)

## 2024-03-26 LAB — CBC WITH DIFFERENTIAL/PLATELET
Abs Immature Granulocytes: 0.01 K/uL (ref 0.00–0.07)
Basophils Absolute: 0 K/uL (ref 0.0–0.1)
Basophils Relative: 1 %
Eosinophils Absolute: 0 K/uL (ref 0.0–0.5)
Eosinophils Relative: 0 %
HCT: 36.7 % (ref 36.0–46.0)
Hemoglobin: 11.9 g/dL — ABNORMAL LOW (ref 12.0–15.0)
Immature Granulocytes: 0 %
Lymphocytes Relative: 14 %
Lymphs Abs: 0.5 K/uL — ABNORMAL LOW (ref 0.7–4.0)
MCH: 32.6 pg (ref 26.0–34.0)
MCHC: 32.4 g/dL (ref 30.0–36.0)
MCV: 100.5 fL — ABNORMAL HIGH (ref 80.0–100.0)
Monocytes Absolute: 0.4 K/uL (ref 0.1–1.0)
Monocytes Relative: 12 %
Neutro Abs: 2.7 K/uL (ref 1.7–7.7)
Neutrophils Relative %: 73 %
Platelets: 144 K/uL — ABNORMAL LOW (ref 150–400)
RBC: 3.65 MIL/uL — ABNORMAL LOW (ref 3.87–5.11)
RDW: 15.7 % — ABNORMAL HIGH (ref 11.5–15.5)
WBC: 3.7 K/uL — ABNORMAL LOW (ref 4.0–10.5)
nRBC: 0 % (ref 0.0–0.2)

## 2024-03-26 LAB — PRO BRAIN NATRIURETIC PEPTIDE: Pro Brain Natriuretic Peptide: 1813 pg/mL — ABNORMAL HIGH

## 2024-03-26 LAB — RESP PANEL BY RT-PCR (RSV, FLU A&B, COVID)  RVPGX2
Influenza A by PCR: POSITIVE — AB
Influenza B by PCR: NEGATIVE
Resp Syncytial Virus by PCR: NEGATIVE
SARS Coronavirus 2 by RT PCR: NEGATIVE

## 2024-03-26 MED ORDER — TORSEMIDE 20 MG PO TABS
40.0000 mg | ORAL_TABLET | Freq: Every day | ORAL | Status: DC
  Administered 2024-03-27 – 2024-04-01 (×6): 40 mg via ORAL
  Filled 2024-03-26 (×6): qty 2

## 2024-03-26 MED ORDER — IPRATROPIUM-ALBUTEROL 0.5-2.5 (3) MG/3ML IN SOLN
3.0000 mL | Freq: Once | RESPIRATORY_TRACT | Status: AC
  Administered 2024-03-26: 3 mL via RESPIRATORY_TRACT
  Filled 2024-03-26: qty 3

## 2024-03-26 MED ORDER — IPRATROPIUM-ALBUTEROL 0.5-2.5 (3) MG/3ML IN SOLN
3.0000 mL | Freq: Three times a day (TID) | RESPIRATORY_TRACT | Status: AC
  Administered 2024-03-27: 3 mL via RESPIRATORY_TRACT
  Filled 2024-03-26: qty 3

## 2024-03-26 MED ORDER — ONDANSETRON HCL 4 MG PO TABS: 4.0000 mg | ORAL_TABLET | Freq: Four times a day (QID) | ORAL | Status: DC | PRN

## 2024-03-26 MED ORDER — ACETAMINOPHEN 650 MG RE SUPP: 650.0000 mg | Freq: Four times a day (QID) | RECTAL | Status: DC | PRN

## 2024-03-26 MED ORDER — GUAIFENESIN-DM 100-10 MG/5ML PO SYRP
15.0000 mL | ORAL_SOLUTION | Freq: Three times a day (TID) | ORAL | Status: AC
  Administered 2024-03-26 – 2024-03-27 (×3): 15 mL via ORAL
  Filled 2024-03-26 (×3): qty 15

## 2024-03-26 MED ORDER — OSELTAMIVIR PHOSPHATE 30 MG PO CAPS
30.0000 mg | ORAL_CAPSULE | Freq: Two times a day (BID) | ORAL | Status: AC
  Administered 2024-03-27 – 2024-03-31 (×9): 30 mg via ORAL
  Filled 2024-03-26 (×9): qty 1

## 2024-03-26 MED ORDER — ALBUTEROL SULFATE (2.5 MG/3ML) 0.083% IN NEBU
10.0000 mg/h | INHALATION_SOLUTION | Freq: Once | RESPIRATORY_TRACT | Status: AC
  Administered 2024-03-26: 10 mg/h via RESPIRATORY_TRACT
  Filled 2024-03-26: qty 12

## 2024-03-26 MED ORDER — OSELTAMIVIR PHOSPHATE 75 MG PO CAPS
75.0000 mg | ORAL_CAPSULE | Freq: Once | ORAL | Status: AC
  Administered 2024-03-26: 75 mg via ORAL
  Filled 2024-03-26: qty 1

## 2024-03-26 MED ORDER — ACETAMINOPHEN 325 MG PO TABS
650.0000 mg | ORAL_TABLET | Freq: Four times a day (QID) | ORAL | Status: DC | PRN
  Administered 2024-03-27: 650 mg via ORAL
  Filled 2024-03-26: qty 2

## 2024-03-26 MED ORDER — METHYLPREDNISOLONE SODIUM SUCC 125 MG IJ SOLR
60.0000 mg | Freq: Two times a day (BID) | INTRAMUSCULAR | Status: AC
  Administered 2024-03-27 (×2): 60 mg via INTRAVENOUS
  Filled 2024-03-26 (×2): qty 2

## 2024-03-26 MED ORDER — ONDANSETRON HCL 4 MG/2ML IJ SOLN: 4.0000 mg | Freq: Four times a day (QID) | INTRAMUSCULAR | Status: DC | PRN

## 2024-03-26 MED ORDER — METOPROLOL SUCCINATE ER 25 MG PO TB24
25.0000 mg | ORAL_TABLET | Freq: Every day | ORAL | Status: DC
  Administered 2024-03-26 – 2024-04-01 (×7): 25 mg via ORAL
  Filled 2024-03-26 (×7): qty 1

## 2024-03-26 MED ORDER — LEVOTHYROXINE SODIUM 75 MCG PO TABS
150.0000 ug | ORAL_TABLET | Freq: Every day | ORAL | Status: DC
  Administered 2024-03-27: 150 ug via ORAL
  Filled 2024-03-26: qty 2

## 2024-03-26 MED ORDER — METHYLPREDNISOLONE SODIUM SUCC 125 MG IJ SOLR
125.0000 mg | Freq: Once | INTRAMUSCULAR | Status: AC
  Administered 2024-03-26: 125 mg via INTRAVENOUS
  Filled 2024-03-26: qty 2

## 2024-03-26 MED ORDER — OSELTAMIVIR PHOSPHATE 75 MG PO CAPS: 75.0000 mg | ORAL_CAPSULE | Freq: Two times a day (BID) | ORAL | Status: DC

## 2024-03-26 MED ORDER — ATORVASTATIN CALCIUM 40 MG PO TABS
40.0000 mg | ORAL_TABLET | Freq: Every day | ORAL | Status: DC
  Administered 2024-03-26 – 2024-04-01 (×7): 40 mg via ORAL
  Filled 2024-03-26 (×7): qty 1

## 2024-03-26 MED ORDER — RIVAROXABAN 15 MG PO TABS
15.0000 mg | ORAL_TABLET | Freq: Every day | ORAL | Status: DC
  Administered 2024-03-26 – 2024-04-01 (×7): 15 mg via ORAL
  Filled 2024-03-26 (×7): qty 1

## 2024-03-26 MED ORDER — IPRATROPIUM-ALBUTEROL 0.5-2.5 (3) MG/3ML IN SOLN: 3.0000 mL | RESPIRATORY_TRACT | Status: DC | PRN

## 2024-03-26 MED ORDER — AMIODARONE HCL 200 MG PO TABS
200.0000 mg | ORAL_TABLET | Freq: Every day | ORAL | Status: DC
  Administered 2024-03-26 – 2024-04-01 (×7): 200 mg via ORAL
  Filled 2024-03-26 (×7): qty 1

## 2024-03-26 MED ORDER — PREGABALIN 75 MG PO CAPS
75.0000 mg | ORAL_CAPSULE | Freq: Every day | ORAL | Status: DC
  Administered 2024-03-26 – 2024-03-31 (×6): 75 mg via ORAL
  Filled 2024-03-26 (×6): qty 1

## 2024-03-26 MED ORDER — PREDNISONE 20 MG PO TABS
40.0000 mg | ORAL_TABLET | Freq: Every day | ORAL | Status: DC
  Administered 2024-03-28 – 2024-03-29 (×2): 40 mg via ORAL
  Filled 2024-03-26 (×2): qty 2

## 2024-03-26 MED ORDER — POLYETHYLENE GLYCOL 3350 17 G PO PACK: 17.0000 g | PACK | Freq: Every day | ORAL | Status: DC | PRN

## 2024-03-26 MED ORDER — POTASSIUM CHLORIDE 20 MEQ PO PACK
40.0000 meq | PACK | Freq: Two times a day (BID) | ORAL | Status: AC
  Administered 2024-03-26 – 2024-03-27 (×2): 40 meq via ORAL
  Filled 2024-03-26 (×2): qty 2

## 2024-03-26 NOTE — ED Notes (Signed)
 Pt ambulated at bedside while 02 sat stayed at 90%.

## 2024-03-26 NOTE — ED Provider Notes (Signed)
 Signout received from Julie Idol.  In short patient is a 79 year old female with history of COPD as needed night oxygen , CHF, A-fib on Xarelto  presents with shortness of breath and wheezing in the setting of flulike symptoms x 1 week.  Upon arrival patient was hypoxic 88% on room air.  She has been satting mid 90s on 2 L.  Positive for influenza A.  Chest x-ray without any consolidation or pulmonary edema.  Has received steroids, DuoNebs at this time she is on continuous albuterol .  At time of signout pending admission call Physical Exam  BP (!) 163/75   Pulse 84   Temp 98.8 F (37.1 C) (Oral)   Resp (!) 25   Ht 5' 1 (1.549 m)   Wt 72.6 kg   SpO2 (!) 88%   BMI 30.24 kg/m   Physical Exam Vitals and nursing note reviewed.  Constitutional:      General: She is not in acute distress.    Appearance: She is well-developed.  HENT:     Head: Normocephalic and atraumatic.  Eyes:     Conjunctiva/sclera: Conjunctivae normal.  Cardiovascular:     Rate and Rhythm: Normal rate and regular rhythm.     Heart sounds: No murmur heard. Pulmonary:     Breath sounds: Wheezing present.  Abdominal:     Palpations: Abdomen is soft.     Tenderness: There is no abdominal tenderness.  Musculoskeletal:        General: No swelling.     Cervical back: Neck supple.  Skin:    General: Skin is warm and dry.     Capillary Refill: Capillary refill takes less than 2 seconds.  Neurological:     Mental Status: She is alert.  Psychiatric:        Mood and Affect: Mood normal.     Procedures  Procedures  ED Course / MDM    Medical Decision Making Amount and/or Complexity of Data Reviewed Labs: ordered. Radiology: ordered.  Risk Prescription drug management. Decision regarding hospitalization.   [place] patient with Dr. Pearlean, agreed for admission       Diane Cohen 03/26/24 1914    Towana Ozell BROCKS, MD 03/27/24 [phone]

## 2024-03-26 NOTE — ED Notes (Signed)
 Pt has audible wheezing, labored breathing, and o2 saturation of 88-89%. O2 administered via [place] at 2lpm

## 2024-03-26 NOTE — H&P (Signed)
 " History and Physical    Diane Cohen FMW:[id number] DOB: 1945/12/27 DOA: 03/26/2024  PCP: Shona Norleen PEDLAR, MD   Patient coming from: Home  I have personally briefly reviewed patient's old medical records in [facility]  Chief Complaint: Difficulty Breathing  HPI: Diane Cohen is a 79 y.o. female with medical history significant for diastolic CHF, atrial fibrillation on anticoagulation, COPD, hypertension. Patient presented to the ED with complaints of difficulty breathing that started 3 days ago.  She reports productive cough, fevers, body aches, and difficulty breathing.  No chest pain.  She is supposed to be on O2 at night-but has not been using it because it broke. No vomiting no diarrhea no abdominal pain.  She has been compliant with Xarelto .  ED Course: Temperature 98.8.  Heart rate 79-100.  Respiratory rate 17-25.  Blood pressures 150s to 160s.  O2 sats 88% on room air, placed on 2 L. Influenza A positive. Chest x-ray negative for acute abnormality. 125 mg Solu-Medrol , nebs given.  Review of Systems: As per HPI all other systems reviewed and negative.  Past Medical History:  Diagnosis Date   Anxiety    Atrial fibrillation [facility])    Atrial fibrillation ablation in 2019 - Dr. Kelsie   Chronic diastolic heart failure (HCC)    Depression    Essential hypertension    History of TIA (transient ischemic attack)    Hyperlipidemia    Hypothyroidism    Pneumonia     Past Surgical History:  Procedure Laterality Date   ABDOMINAL HYSTERECTOMY     ABLATION OF DYSRHYTHMIC FOCUS  07/06/2017   ATRIAL FIBRILLATION ABLATION N/A 07/06/2017   Procedure: ATRIAL FIBRILLATION ABLATION;  Surgeon: Kelsie Agent, MD;  Location: [facility] INVASIVE CV LAB;  Service: Cardiovascular;  Laterality: N/A;   BREAST SURGERY     biopsy   CARDIOVERSION N/A 01/23/2016   Procedure: CARDIOVERSION;  Surgeon: Jayson KANDICE Sierras, MD;  Location: [facility] ENDO SUITE;  Service: Cardiovascular;  Laterality: N/A;    CARDIOVERSION N/A 11/13/2016   Procedure: CARDIOVERSION;  Surgeon: Alvan Dorn FALCON, MD;  Location: [facility] ENDO SUITE;  Service: Endoscopy;  Laterality: N/A;   CARDIOVERSION N/A 04/20/2018   Procedure: CARDIOVERSION;  Surgeon: Loni Soyla LABOR, MD;  Location: [facility] ENDOSCOPY;  Service: Cardiovascular;  Laterality: N/A;   CARDIOVERSION N/A 05/23/2020   Procedure: CARDIOVERSION;  Surgeon: Sierras Jayson KANDICE, MD;  Location: [facility] ENDO SUITE;  Service: Cardiovascular;  Laterality: N/A;   CATARACT EXTRACTION W/PHACO Left 11/17/2021   Procedure: CATARACT EXTRACTION PHACO AND INTRAOCULAR LENS PLACEMENT (IOC);  Surgeon: Harrie Agent, MD;  Location: [facility] ORS;  Service: Ophthalmology;  Laterality: Left;  CDE:9.47   CATARACT EXTRACTION W/PHACO Right 12/01/2021   Procedure: CATARACT EXTRACTION PHACO AND INTRAOCULAR LENS PLACEMENT (IOC);  Surgeon: Harrie Agent, MD;  Location: [facility] ORS;  Service: Ophthalmology;  Laterality: Right;  CDE: 6.91   CHOLECYSTECTOMY     COLONOSCOPY N/A 10/07/2018   Sigmoid and descending colon diverticulosis, three 6 to 15 mm polyps in rectum, ascending colon, and IC valve, one 6 mm polyp in cecum. (sessile serrated polyps and sessile serrated adenoma of ascending colon with focal high grade dysplasia.   coloscopy     ESOPHAGEAL DILATION  10/07/2018   Procedure: ESOPHAGEAL DILATION;  Surgeon: Shaaron Lamar HERO, MD;  Location: [facility] ENDO SUITE;  Service: Endoscopy;;   ESOPHAGOGASTRODUODENOSCOPY N/A 10/07/2018   normal esophagus s/p dilation   POLYPECTOMY  10/07/2018   Procedure: POLYPECTOMY;  Surgeon: Shaaron Lamar HERO, MD;  Location:  [facility] END[facility];  Service: Endoscopy;;  Cecal polyps x 2 hot snare Ascending polyp x 1 Hot snare Rectal polyp x 1   TEE WITHOUT CARDIOVERSION  07/06/2017   TEE WITHOUT CARDIOVERSION N/A 07/06/2017   Procedure: TRANSESOPHAGEAL ECHOCARDIOGRAM (TEE);  Surgeon: Delford Maude BROCKS, MD;  Location: [facility] ENDOSCOPY;  Service: Cardiovascular;  Laterality: N/A;     reports that she quit  smoking about 51 years ago. Her smoking use included cigarettes. She smoked an average of 0.5 packs per day. She has never used smokeless tobacco. She reports that she does not currently use alcohol. She reports that she does not use drugs.  Allergies[1]  Family History  Problem Relation Age of Onset   Hypertension Other    Hypertension Father    Hypertension Mother    Anemia Sister    Hypertension Sister    Colon cancer Neg Hx    Colon polyps Neg Hx     Prior to Admission medications  Medication Sig Start Date End Date Taking? Authorizing Provider  amiodarone  (PACERONE ) 200 MG tablet TAKE 1 TABLET(200 MG) BY MOUTH DAILY 09/23/23   Debera Jayson MATSU, MD  atorvastatin  (LIPITOR) 40 MG tablet Take 1 tablet (40 mg total) by mouth daily. 12/15/23 03/14/24  Debera Jayson MATSU, MD  benzonatate  (TESSALON ) 200 MG capsule Take 200 mg by mouth 2 (two) times daily as needed. 08/11/23   [provider]  Cholecalciferol (VITAMIN D3) 50 MCG (2000 UT) capsule Take 2,000 Units by mouth daily.    [provider]  Dextromethorphan  HBr (DELSYM  PO) Take 1 Dose by mouth every 12 (twelve) hours as needed.    [provider]  dextromethorphan -guaiFENesin  (MUCINEX  DM) 30-600 MG 12hr tablet Take 1 tablet by mouth 2 (two) times daily.    [provider]  diltiazem  (CARDIZEM  CD) 180 MG 24 hr capsule TAKE 1 CAPSULE(180 MG) BY MOUTH DAILY 10/13/23   Debera Jayson MATSU, MD  docusate sodium  (COLACE) 100 MG capsule Take 300 mg by mouth daily as needed for mild constipation.    [provider]  DULoxetine  (CYMBALTA ) 60 MG capsule Take 1 capsule (60 mg total) by mouth daily. 06/24/19   Ricky Fines, MD  FARXIGA  10 MG TABS tablet TAKE 1 TABLET(10 MG) BY MOUTH DAILY BEFORE BREAKFAST 08/03/23   Debera Jayson MATSU, MD  fluticasone  (FLONASE ) 50 MCG/ACT nasal spray Place 2 sprays into both nostrils daily as needed for allergies or rhinitis.    [provider]  hydrALAZINE   (APRESOLINE ) 25 MG tablet Take 1 tablet (25 mg total) by mouth 2 (two) times daily. 08/20/23   Evonnie Lenis, MD  ipratropium (ATROVENT ) 0.06 % nasal spray Place 2 sprays into both nostrils 3 (three) times daily. 06/12/23   Evonnie Lenis, MD  ipratropium-albuterol  (DUONEB) 0.5-2.5 (3) MG/3ML SOLN Take 3 mLs by nebulization every 6 (six) hours as needed. 06/12/23   Evonnie Lenis, MD  Iron-FA-B Cmp-C-Biot-Probiotic (FUSION PLUS) CAPS Take 1 capsule by mouth daily. 12/01/22   [provider]  levocetirizine (XYZAL ) 5 MG tablet Take 5 mg by mouth daily. 12/03/23   [provider]  levothyroxine  (SYNTHROID ) 150 MCG tablet Take 150 mcg by mouth daily. 08/05/23   [provider]  magnesium  oxide (MAG-OX) 400 (240 Mg) MG tablet Take 800 mg by mouth daily.    [provider]  metoprolol  succinate (TOPROL -XL) 25 MG 24 hr tablet Take 1 tablet (25 mg total) by mouth daily. 01/14/24   Debera Jayson MATSU, MD  Multiple Vitamins-Minerals (CENTRUM  SILVER 50+WOMEN) TABS Take 1 tablet by mouth daily.    [provider]  potassium chloride  SA (KLOR-CON  M) 20 MEQ tablet TAKE 1 TABLET(20 MEQ) BY MOUTH DAILY 01/31/24   Debera Jayson MATSU, MD  pregabalin  (LYRICA ) 75 MG capsule TAKE 1 CAPSULE(75 MG) BY MOUTH DAILY 02/17/24   Hope Almarie ORN, NP  Rivaroxaban  (XARELTO ) 15 MG TABS tablet Take 1 tablet (15 mg total) by mouth daily. 12/15/23   Debera Jayson MATSU, MD  SYMBICORT 160-4.5 MCG/ACT inhaler Inhale 2 puffs into the lungs 2 (two) times daily. 08/12/23   [provider]  torsemide  (DEMADEX ) 20 MG tablet Take 2 tablets (40 mg total) by mouth daily. 09/20/23   Debera Jayson MATSU, MD    Physical Exam: Vitals:   03/26/24 1627 03/26/24 1655 03/26/24 1854 03/26/24 1900  BP:      Pulse: 84   79  Resp: (!) 25   (!) 23  Temp:      TempSrc:      SpO2: 90% 90% (!) 88% 100%  Weight:      Height:        Constitutional: NAD, calm, comfortable Vitals:   03/26/24 1627 03/26/24 1655  03/26/24 1854 03/26/24 1900  BP:      Pulse: 84   79  Resp: (!) 25   (!) 23  Temp:      TempSrc:      SpO2: 90% 90% (!) 88% 100%  Weight:      Height:       Eyes: PERRL, lids and conjunctivae normal ENMT: Mucous membranes are moist.   Neck: normal, supple, no masses, no thyromegaly Respiratory: Diffuse expiratory rhonchi, Cardiovascular: Regular rate and rhythm, no murmurs / rubs / gallops.  Trace bilateral lower extremity edema.  Abdomen: no tenderness, no masses palpated. No hepatosplenomegaly.  Musculoskeletal: no clubbing / cyanosis. No joint deformity upper and lower extremities.  Skin: no rashes, lesions, ulcers. No induration Neurologic: No facial asymmetry, moves extremities spontaneously, speech fluent Psychiatric: Normal judgment and insight. Alert and oriented x 3. Normal mood.   Labs on Admission: I have personally reviewed following labs and imaging studies  CBC: Recent Labs  Lab 03/26/24 1640  WBC 3.7*  NEUTROABS 2.7  HGB 11.9*  HCT 36.7  MCV 100.5*  PLT 144*   Basic Metabolic Panel: Recent Labs  Lab 03/26/24 1640  NA 137  K 3.2*  CL 100  CO2 25  GLUCOSE 100*  BUN 12  CREATININE 0.91  CALCIUM  9.1   Radiological Exams on Admission: DG Chest 2 View Result Date: 03/26/2024 EXAM: 2 VIEW(S) XRAY OF THE CHEST 03/26/2024 05:16:49 PM COMPARISON: 10/26/2023 CLINICAL HISTORY: sob, wheezing FINDINGS: LUNGS AND PLEURA: No focal pulmonary opacity. No pleural effusion. No pneumothorax. HEART AND MEDIASTINUM: No acute abnormality of the cardiac and mediastinal silhouettes. BONES AND SOFT TISSUES: No acute osseous abnormality. IMPRESSION: 1. No acute process. Electronically signed by: Franky Crease MD 03/26/2024 05:42 PM EST RP Workstation: [id number]   EKG: Independently reviewed.  Sinus rhythm rate 76, QTc 464.  Assessment/Plan Principal Problem:   COPD with acute exacerbation (HCC) Active Problems:   Acute on chronic hypoxic respiratory failure (HCC)    Influenza A   Essential hypertension   Paroxysmal atrial fibrillation (HCC)   Chronic diastolic heart failure (HCC)  Assessment and Plan:  COPD with acute exacerbation with hypoxia 2/2 influenza A infection-symptoms of 3 days duration.  O2 sats down to 88% on room air, currently on 2 L.  At  baseline she is on O2 at night,  has not been using it because it broke.  Diffuse coarse rhonchi on exam.  Chest x-ray clear. - IV Solu-Medrol  125 mg x 1 given, continue 60 twice daily - Tamiflu  - DuoNebs as needed and scheduled - Mucolytics, flutter valve  Acute on chronic respiratory failure-plan per above - Will need home O2 arranged on discharge  Chronic diastolic CHF-stable and compensated.  Last echo 08/2023 EF 60 to 65%. - Reports compliance with torsemide , resume  Atrial fibrillation-rate controlled on anticoagulation with Xarelto .  Follows with Dr. Debera. - Resume Xarelto , amiodarone , metoprolol  - Pending med reconciliation resume diltiazem   Hypertension -stable.  Continue multiple medications -including hydralazine , metoprolol , diltiazem , torsemide  - which she reports compliance with. - Resume metoprolol  for now, pending med reconciliation resume home meds   DVT prophylaxis: Xarelto  Code Status: Full code  family Communication: None at bedside Disposition Plan: ~ 2 days Consults called: None Admission status:  Inpt tele I certify that at the point of admission it is my clinical judgment that the patient will require inpatient hospital care spanning beyond 2 midnights from the point of admission due to high intensity of service, high risk for further deterioration and high frequency of surveillance required.    Author: Tully FORBES Carwin, MD 03/26/2024 8:42 PM  For on call review [email or web address].     [1]  Allergies Allergen Reactions   Levofloxacin Other (See Comments)    Headache  levofloxacin   Misc. Sulfonamide Containing Compounds Other (See Comments)   Sulfa  Antibiotics Hives   Sulfamethoxazole Other (See Comments)    sulfamethoxazole   "

## 2024-03-26 NOTE — Plan of Care (Signed)
" °  Problem: Education: Goal: Knowledge of General Education information will improve Description: Including pain rating scale, medication(s)/side effects and non-pharmacologic comfort measures Outcome: Progressing   Problem: Health Behavior/Discharge Planning: Goal: Ability to manage health-related needs will improve Outcome: Progressing   Problem: Clinical Measurements: Goal: Will remain free from infection Outcome: Progressing   Problem: Clinical Measurements: Goal: Diagnostic test results will improve Outcome: Progressing   Problem: Clinical Measurements: Goal: Respiratory complications will improve Outcome: Progressing   Problem: Safety: Goal: Ability to remain free from injury will improve Outcome: Progressing   Problem: Activity: Goal: Ability to tolerate increased activity will improve Outcome: Progressing   "

## 2024-03-26 NOTE — ED Triage Notes (Signed)
 Pt c/o flu like symptoms x one week. She c/o fever, body aches, cough and wheezing.

## 2024-03-26 NOTE — ED Provider Notes (Signed)
 " [place] EMERGENCY DEPARTMENT AT [facility] Provider Note   CSN: [id number] Arrival date & time: 03/26/24  1616     Patient presents with: Cough   Diane Cohen is a 79 y.o. female with a history significant for hypertension, CHF, atrial fibrillation on Xarelto  COPD, chronic kidney disease, history of pneumonia presenting with a 1 week history of flulike symptoms including subjective fever and chills, generalized bodyaches, cough which has sometimes been productive of a clear to yellow sputum along with worsening shortness of breath and wheezing.  She was hypoxic on arrival here at 88% on room air.  She states she is supposed to use oxygen  at night as needed but her oxygen  equipment is broken.  She does endorse wheezing, she has been using her nebulized albuterol , last treatment was taken this morning.  She denies peripheral edema and orthopnea.   The history is provided by the patient.       Prior to Admission medications  Medication Sig Start Date End Date Taking? Authorizing Provider  amiodarone  (PACERONE ) 200 MG tablet TAKE 1 TABLET(200 MG) BY MOUTH DAILY 09/23/23   Debera Jayson MATSU, MD  atorvastatin  (LIPITOR) 40 MG tablet Take 1 tablet (40 mg total) by mouth daily. 12/15/23 03/14/24  Debera Jayson MATSU, MD  benzonatate  (TESSALON ) 200 MG capsule Take 200 mg by mouth 2 (two) times daily as needed. 08/11/23   [provider]  Cholecalciferol (VITAMIN D3) 50 MCG (2000 UT) capsule Take 2,000 Units by mouth daily.    [provider]  Dextromethorphan  HBr (DELSYM  PO) Take 1 Dose by mouth every 12 (twelve) hours as needed.    [provider]  dextromethorphan -guaiFENesin  (MUCINEX  DM) 30-600 MG 12hr tablet Take 1 tablet by mouth 2 (two) times daily.    [provider]  diltiazem  (CARDIZEM  CD) 180 MG 24 hr capsule TAKE 1 CAPSULE(180 MG) BY MOUTH DAILY 10/13/23   Debera Jayson MATSU, MD  docusate sodium  (COLACE) 100 MG capsule Take 300 mg by mouth daily  as needed for mild constipation.    [provider]  DULoxetine  (CYMBALTA ) 60 MG capsule Take 1 capsule (60 mg total) by mouth daily. 06/24/19   Ricky Fines, MD  FARXIGA  10 MG TABS tablet TAKE 1 TABLET(10 MG) BY MOUTH DAILY BEFORE BREAKFAST 08/03/23   Debera Jayson MATSU, MD  fluticasone  (FLONASE ) 50 MCG/ACT nasal spray Place 2 sprays into both nostrils daily as needed for allergies or rhinitis.    [provider]  hydrALAZINE  (APRESOLINE ) 25 MG tablet Take 1 tablet (25 mg total) by mouth 2 (two) times daily. 08/20/23   Evonnie Lenis, MD  ipratropium (ATROVENT ) 0.06 % nasal spray Place 2 sprays into both nostrils 3 (three) times daily. 06/12/23   Evonnie Lenis, MD  ipratropium-albuterol  (DUONEB) 0.5-2.5 (3) MG/3ML SOLN Take 3 mLs by nebulization every 6 (six) hours as needed. 06/12/23   Evonnie Lenis, MD  Iron-FA-B Cmp-C-Biot-Probiotic (FUSION PLUS) CAPS Take 1 capsule by mouth daily. 12/01/22   [provider]  levocetirizine (XYZAL ) 5 MG tablet Take 5 mg by mouth daily. 12/03/23   [provider]  levothyroxine  (SYNTHROID ) 150 MCG tablet Take 150 mcg by mouth daily. 08/05/23   [provider]  magnesium  oxide (MAG-OX) 400 (240 Mg) MG tablet Take 800 mg by mouth daily.    [provider]  metoprolol  succinate (TOPROL -XL) 25 MG 24 hr tablet Take 1 tablet (25 mg total) by mouth daily. 01/14/24   Debera Jayson MATSU, MD  Multiple  Vitamins-Minerals (CENTRUM SILVER 50+WOMEN) TABS Take 1 tablet by mouth daily.    [provider]  potassium chloride  SA (KLOR-CON  M) 20 MEQ tablet TAKE 1 TABLET(20 MEQ) BY MOUTH DAILY 01/31/24   Debera Jayson MATSU, MD  pregabalin  (LYRICA ) 75 MG capsule TAKE 1 CAPSULE(75 MG) BY MOUTH DAILY 02/17/24   Hope Almarie ORN, NP  Rivaroxaban  (XARELTO ) 15 MG TABS tablet Take 1 tablet (15 mg total) by mouth daily. 12/15/23   Debera Jayson MATSU, MD  SYMBICORT 160-4.5 MCG/ACT inhaler Inhale 2 puffs into the lungs 2 (two) times daily. 08/12/23    [provider]  torsemide  (DEMADEX ) 20 MG tablet Take 2 tablets (40 mg total) by mouth daily. 09/20/23   Debera Jayson MATSU, MD    Allergies: Levofloxacin, Misc. sulfonamide containing compounds, Sulfa antibiotics, and Sulfamethoxazole    Review of Systems  Constitutional:  Positive for chills and fever.  HENT:  Negative for congestion and sore throat.   Eyes: Negative.   Respiratory:  Positive for cough, shortness of breath and wheezing. Negative for chest tightness.   Cardiovascular:  Negative for chest pain.  Gastrointestinal:  Negative for abdominal pain, nausea and vomiting.  Genitourinary: Negative.   Musculoskeletal:  Positive for myalgias. Negative for arthralgias, joint swelling and neck pain.  Skin: Negative.  Negative for rash and wound.  Neurological:  Negative for dizziness, weakness, light-headedness, numbness and headaches.  Psychiatric/Behavioral: Negative.      Updated Vital Signs BP (!) 163/75   Pulse 84   Temp 98.8 F (37.1 C) (Oral)   Resp (!) 25   Ht 5' 1 (1.549 m)   Wt 72.6 kg   SpO2 (!) 88%   BMI 30.24 kg/m   Physical Exam Vitals and nursing note reviewed.  Constitutional:      Appearance: She is well-developed.  HENT:     Head: Normocephalic and atraumatic.  Eyes:     Conjunctiva/sclera: Conjunctivae normal.  Cardiovascular:     Rate and Rhythm: Normal rate and regular rhythm.     Heart sounds: Normal heart sounds.  Pulmonary:     Effort: Respiratory distress present.     Breath sounds: Wheezing present. No rhonchi.     Comments: Expiratory wheeze with prolonged expirations all lung fields. Abdominal:     General: Bowel sounds are normal.     Palpations: Abdomen is soft.     Tenderness: There is no abdominal tenderness.  Musculoskeletal:        General: Normal range of motion.     Cervical back: Normal range of motion.  Skin:    General: Skin is warm and dry.  Neurological:     Mental Status: She is alert.     (all labs  ordered are listed, but only abnormal results are displayed) Labs Reviewed  RESP PANEL BY RT-PCR (RSV, FLU A&B, COVID)  RVPGX2 - Abnormal; Notable for the following components:      Result Value   Influenza A by PCR POSITIVE (*)    All other components within normal limits  BASIC METABOLIC PANEL WITH GFR - Abnormal; Notable for the following components:   Potassium 3.2 (*)    Glucose, Bld 100 (*)    All other components within normal limits  CBC WITH DIFFERENTIAL/PLATELET - Abnormal; Notable for the following components:   WBC 3.7 (*)    RBC 3.65 (*)    Hemoglobin 11.9 (*)    MCV 100.5 (*)    RDW 15.7 (*)    Platelets 144 (*)  Lymphs Abs 0.5 (*)    All other components within normal limits  PRO BRAIN NATRIURETIC PEPTIDE    EKG: EKG Interpretation Date/Time:  Sunday March 26 2024 17:00:07 EST Ventricular Rate:  76 PR Interval:  158 QRS Duration:  88 QT Interval:  412 QTC Calculation: 464 R Axis:   96  Text Interpretation: Sinus rhythm Right axis deviation Low voltage, precordial leads COPY Confirmed by Towana Sharper [id number]) on 03/26/2024 5:07:53 PM  Radiology: DG Chest 2 View Result Date: 03/26/2024 EXAM: 2 VIEW(S) XRAY OF THE CHEST 03/26/2024 05:16:49 PM COMPARISON: 10/26/2023 CLINICAL HISTORY: sob, wheezing FINDINGS: LUNGS AND PLEURA: No focal pulmonary opacity. No pleural effusion. No pneumothorax. HEART AND MEDIASTINUM: No acute abnormality of the cardiac and mediastinal silhouettes. BONES AND SOFT TISSUES: No acute osseous abnormality. IMPRESSION: 1. No acute process. Electronically signed by: Franky Crease MD 03/26/2024 05:42 PM EST RP Workstation: [id number]     Procedures   Medications Ordered in the ED  ipratropium-albuterol  (DUONEB) 0.5-2.5 (3) MG/3ML nebulizer solution 3 mL (3 mLs Nebulization Given 03/26/24 1756)  methylPREDNISolone  sodium succinate (SOLU-MEDROL ) 125 mg/2 mL injection 125 mg (125 mg Intravenous Given 03/26/24 1759)  albuterol  (PROVENTIL ) (2.5  MG/3ML) 0.083% nebulizer solution (10 mg/hr Nebulization Given 03/26/24 1856)                                    Medical Decision Making Patient presenting with COPD exacerbation in the setting of acute influenza A.  She continues to have expiratory wheeze, uncomfortable with her breathing, short of breath with oxygen  saturation at 90%.  She was given Solu-Medrol  and a DuoNeb with no significant improvement in her respiratory exam and remains around 90%.  Additional hour-long neb treatment has been ordered.  Patient will benefit from admission for supportive care.  Her flu symptoms started 1 week ago, questionable benefit of Tamiflu  at this time.  Amount and/or Complexity of Data Reviewed Labs: ordered.    Details: Labs reviewed, positive for influenza A, she is hypobulimic with a potassium of 3.2, her WBC count is 3.7, hemoglobin 11.9 which is relatively stable in comparison. Radiology: ordered.    Details: Chest x-ray is negative for pneumonia ECG/medicine tests: ordered.    Details: Sinus rhythm rate 76, right axis deviation Discussion of management or test interpretation with external provider(s): Call placed to hospitalist for admission.  Risk Prescription drug management. Decision regarding hospitalization.        Final diagnoses:  Chronic obstructive pulmonary disease with acute exacerbation [facility])  Influenza A    ED Discharge Orders     None          Zainab Crumrine, PA-C 03/26/24 1904  "

## 2024-03-26 NOTE — ED Notes (Signed)
 Report given to [facility] at this time. No questions, comments, or concerns after report was given.

## 2024-03-27 ENCOUNTER — Other Ambulatory Visit (HOSPITAL_COMMUNITY): Payer: Self-pay

## 2024-03-27 DIAGNOSIS — I480 Paroxysmal atrial fibrillation: Secondary | ICD-10-CM | POA: Diagnosis not present

## 2024-03-27 DIAGNOSIS — J9621 Acute and chronic respiratory failure with hypoxia: Secondary | ICD-10-CM | POA: Diagnosis not present

## 2024-03-27 DIAGNOSIS — J101 Influenza due to other identified influenza virus with other respiratory manifestations: Secondary | ICD-10-CM | POA: Diagnosis not present

## 2024-03-27 DIAGNOSIS — I5032 Chronic diastolic (congestive) heart failure: Secondary | ICD-10-CM | POA: Diagnosis not present

## 2024-03-27 DIAGNOSIS — J441 Chronic obstructive pulmonary disease with (acute) exacerbation: Secondary | ICD-10-CM | POA: Diagnosis not present

## 2024-03-27 DIAGNOSIS — I10 Essential (primary) hypertension: Secondary | ICD-10-CM | POA: Diagnosis not present

## 2024-03-27 LAB — BASIC METABOLIC PANEL WITH GFR
Anion gap: 12 (ref 5–15)
BUN: 12 mg/dL (ref 8–23)
CO2: 26 mmol/L (ref 22–32)
Calcium: 9.7 mg/dL (ref 8.9–10.3)
Chloride: 104 mmol/L (ref 98–111)
Creatinine, Ser: 0.92 mg/dL (ref 0.44–1.00)
GFR, Estimated: >60 mL/min
Glucose, Bld: 114 mg/dL — ABNORMAL HIGH (ref 70–99)
Potassium: 3.9 mmol/L (ref 3.5–5.1)
Sodium: 142 mmol/L (ref 135–145)

## 2024-03-27 MED ORDER — LOSARTAN POTASSIUM 25 MG PO TABS
12.5000 mg | ORAL_TABLET | Freq: Every day | ORAL | Status: DC
  Administered 2024-03-28 – 2024-04-01 (×5): 12.5 mg via ORAL
  Filled 2024-03-27 (×5): qty 1

## 2024-03-27 MED ORDER — DAPAGLIFLOZIN PROPANEDIOL 10 MG PO TABS
10.0000 mg | ORAL_TABLET | Freq: Every day | ORAL | Status: DC
  Administered 2024-03-28 – 2024-04-01 (×5): 10 mg via ORAL
  Filled 2024-03-27 (×5): qty 1

## 2024-03-27 MED ORDER — LEVOCETIRIZINE DIHYDROCHLORIDE 5 MG PO TABS: 5.0000 mg | ORAL_TABLET | Freq: Every evening | ORAL | Status: DC

## 2024-03-27 MED ORDER — FLUTICASONE PROPIONATE 50 MCG/ACT NA SUSP: 2.0000 | Freq: Every day | NASAL | Status: DC | PRN

## 2024-03-27 MED ORDER — LEVOTHYROXINE SODIUM 137 MCG PO TABS
137.0000 ug | ORAL_TABLET | Freq: Every day | ORAL | Status: DC
  Administered 2024-03-28 – 2024-04-01 (×5): 137 ug via ORAL
  Filled 2024-03-27 (×5): qty 1

## 2024-03-27 MED ORDER — LORATADINE 10 MG PO TABS
10.0000 mg | ORAL_TABLET | Freq: Every evening | ORAL | Status: DC
  Administered 2024-03-27 – 2024-03-31 (×5): 10 mg via ORAL
  Filled 2024-03-27 (×5): qty 1

## 2024-03-27 MED ORDER — DILTIAZEM HCL ER COATED BEADS 180 MG PO CP24
180.0000 mg | ORAL_CAPSULE | Freq: Every day | ORAL | Status: DC
  Administered 2024-03-27 – 2024-04-01 (×6): 180 mg via ORAL
  Filled 2024-03-27 (×6): qty 1

## 2024-03-27 MED ORDER — DULOXETINE HCL 60 MG PO CPEP
60.0000 mg | ORAL_CAPSULE | Freq: Every day | ORAL | Status: DC
  Administered 2024-03-27 – 2024-04-01 (×6): 60 mg via ORAL
  Filled 2024-03-27 (×6): qty 1

## 2024-03-27 MED ORDER — IPRATROPIUM-ALBUTEROL 0.5-2.5 (3) MG/3ML IN SOLN
3.0000 mL | Freq: Four times a day (QID) | RESPIRATORY_TRACT | Status: DC
  Administered 2024-03-27 – 2024-03-28 (×5): 3 mL via RESPIRATORY_TRACT
  Filled 2024-03-27 (×5): qty 3

## 2024-03-27 MED ORDER — FLUTICASONE FUROATE-VILANTEROL 200-25 MCG/ACT IN AEPB
1.0000 | INHALATION_SPRAY | Freq: Every day | RESPIRATORY_TRACT | Status: DC
  Administered 2024-03-27 – 2024-03-29 (×3): 1 via RESPIRATORY_TRACT
  Filled 2024-03-27: qty 28

## 2024-03-27 NOTE — Plan of Care (Signed)
" °  Problem: Clinical Measurements: Goal: Respiratory complications will improve Outcome: Progressing   Problem: Coping: Goal: Level of anxiety will decrease Outcome: Progressing   Problem: Elimination: Goal: Will not experience complications related to urinary retention Outcome: Progressing   Problem: Pain Managment: Goal: General experience of comfort will improve and/or be controlled Outcome: Progressing   Problem: Safety: Goal: Ability to remain free from injury will improve Outcome: Progressing   Problem: Skin Integrity: Goal: Risk for impaired skin integrity will decrease Outcome: Progressing   Problem: Education: Goal: Knowledge of disease or condition will improve Outcome: Progressing   Problem: Activity: Goal: Ability to tolerate increased activity will improve Outcome: Progressing Goal: Will verbalize the importance of balancing activity with adequate rest periods Outcome: Progressing   "

## 2024-03-27 NOTE — Hospital Course (Addendum)
 79 y.o. female with medical history significant for diastolic CHF, atrial fibrillation on anticoagulation, COPD, hypertension and HLD.  Patient presented to the ED with complaints of difficulty breathing that started 3 days ago.  She reports productive cough, fevers, body aches, and difficulty breathing.  No chest pain.  She is supposed to be on 2L  O2 at night-but has not been using it because it broke. No vomiting no diarrhea no abdominal pain.  She has been compliant with Xarelto .  Patient was admitted for acute COPD exacerbation influenza A infection. She was started on IV Solu-Medrol  and DuoNebs.  She was continued on her home dose of torsemide .  She was given a dose of IV furosemide  for signs of fluid overload.. her oxygen  was gradually weaned.  She gradually improved clinically.  The patient did desaturate to 87% with ambulation.  She was discharged on 2 L nasal cannula.

## 2024-03-27 NOTE — Progress Notes (Addendum)
" °   03/27/24 0544  Vitals  BP (!) 176/102  BP Location Right Arm  BP Method Manual  ECG Heart Rate (!) 110  Resp (!) 24  MEWS COLOR  MEWS Score Color Yellow  MEWS Score  MEWS Temp 0  MEWS Systolic 0  MEWS Pulse 1  MEWS RR 1  MEWS LOC 0  MEWS Score 2  Provider Notification  Provider Name/Title Lynwood Kipper  Date Provider Notified 03/27/24  Time Provider Notified 0550  Method of Notification Secure chat  Notification Reason Change in status  Provider response No new orders  Date of Provider Response 03/27/24  Time of Provider Response 0555   "

## 2024-03-27 NOTE — Plan of Care (Signed)

## 2024-03-27 NOTE — Progress Notes (Signed)
 Transition of Care Department [facility]) has reviewed patient and no other TOC needs have been identified at this time. We will continue to monitor patient advancement through interdisciplinary progression rounds. If new patient transition needs arise, please place a TOC consult.   03/27/24 1057  TOC Brief Assessment  Insurance and Status Reviewed  Patient has primary care physician Yes  Home environment has been reviewed Lives alone.  Prior level of function: Independent.  Prior/Current Home Services No current home services  Social Drivers of Health Review SDOH reviewed no interventions necessary  Readmission risk has been reviewed Yes  Transition of care needs no transition of care needs at this time

## 2024-03-27 NOTE — Progress Notes (Signed)
 " PROGRESS NOTE   Diane Cohen  FMW:[id number] DOB: 10-Oct-1945 DOA: 03/26/2024 PCP: Shona Norleen PEDLAR, MD   Chief Complaint  Patient presents with   Cough   Level of care: Telemetry  Brief Admission History:  79 y.o. female with medical history significant for diastolic CHF, atrial fibrillation on anticoagulation, COPD, hypertension.  Patient presented to the ED with complaints of difficulty breathing that started 3 days ago.  She reports productive cough, fevers, body aches, and difficulty breathing.  No chest pain.  She is supposed to be on O2 at night-but has not been using it because it broke. No vomiting no diarrhea no abdominal pain.  She has been compliant with Xarelto .  Patient was admitted for acute COPD exacerbation influenza A infection.   Assessment and Plan:  COPD with acute exacerbation with hypoxia 2/2 influenza A infection - symptoms of 3 days duration.  O2 sats down to 88% on room air, currently on 2 L.  At baseline she is on O2 at night,  has not been using it because it broke.  Diffuse coarse rhonchi on exam.  Chest x-ray clear. - IV Solu-Medrol  125 mg x 1 given, continue 60 twice daily - Tamiflu   30 mg BID  - DuoNebs as needed and scheduled - Mucolytics, flutter valve   Acute on chronic respiratory failure-plan per above - Will need home O2 arranged on discharge   Chronic diastolic CHF-stable and compensated.  Last echo 08/2023 EF 60 to 65%. - Reports compliance with torsemide , resume   Atrial fibrillation-rate controlled on anticoagulation with Xarelto .  Follows with Dr. Debera. - Resume Xarelto , amiodarone , metoprolol  - resume diltiazem  CD 180 mg daily    Hypertension -stable.  Continue multiple medications -including hydralazine , metoprolol , diltiazem , torsemide  - which she reports compliance with. - Resume home BP lowering meds   DVT prophylaxis: rivaroxaban  Code Status:  FULL  Family Communication:  Disposition: home in 1-2 days   Consultants:    Procedures:   Antimicrobials:    Subjective: Pt having a lot of malaise and fatigue.   Objective: Vitals:   03/27/24 0649 03/27/24 0722 03/27/24 0736 03/27/24 1111  BP: (!) 157/73 (!) 141/69    Pulse:  85 85   Resp:   (!) 25 16  Temp:  99 F (37.2 C)    TempSrc:  Oral    SpO2:  93% 93% 98%  Weight:      Height:        Intake/Output Summary (Last 24 hours) at 03/27/2024 1218 Last data filed at 03/27/2024 0900 Gross per 24 hour  Intake 240 ml  Output --  Net 240 ml   Filed Weights   03/26/24 1617 03/26/24 2008  Weight: 72.6 kg 74.2 kg   Examination:  General exam: Appears calm and comfortable  Respiratory system: diffuse expiratory wheeze, no increased work of breathing.  Cardiovascular system: normal S1 & S2 heard. No JVD, murmurs, rubs, gallops or clicks. No pedal edema. Gastrointestinal system: Abdomen is nondistended, soft and nontender. No organomegaly or masses felt. Normal bowel sounds heard. Central nervous system: Alert and oriented. No focal neurological deficits. Extremities: Symmetric 5 x 5 power. Skin: No rashes, lesions or ulcers. Psychiatry: Judgement and insight appear normal. Mood & affect appropriate.   Data Reviewed: I have personally reviewed following labs and imaging studies  CBC: Recent Labs  Lab 03/26/24 1640  WBC 3.7*  NEUTROABS 2.7  HGB 11.9*  HCT 36.7  MCV 100.5*  PLT 144*    Basic  Metabolic Panel: Recent Labs  Lab 03/26/24 1640 03/27/24 0427  NA 137 142  K 3.2* 3.9  CL 100 104  CO2 25 26  GLUCOSE 100* 114*  BUN 12 12  CREATININE 0.91 0.92  CALCIUM  9.1 9.7    CBG: No results for input(s): GLUCAP in the last 168 hours.  Recent Results (from the past 240 hours)  Resp panel by RT-PCR (RSV, Flu A&B, Covid) Anterior Nasal Swab     Status: Abnormal   Collection Time: 03/26/24  5:01 PM   Specimen: Anterior Nasal Swab  Result Value Ref Range Status   SARS Coronavirus 2 by RT PCR NEGATIVE NEGATIVE Final    Comment:  (NOTE) SARS-CoV-2 target nucleic acids are NOT DETECTED.  The SARS-CoV-2 RNA is generally detectable in upper respiratory specimens during the acute phase of infection. The lowest concentration of SARS-CoV-2 viral copies this assay can detect is 138 copies/mL. A negative result does not preclude SARS-Cov-2 infection and should not be used as the sole basis for treatment or other patient management decisions. A negative result may occur with  improper specimen collection/handling, submission of specimen other than nasopharyngeal swab, presence of viral mutation(s) within the areas targeted by this assay, and inadequate number of viral copies(<138 copies/mL). A negative result must be combined with clinical observations, patient history, and epidemiological information. The expected result is Negative.  Fact Sheet for Patients:  bloggercourse.com  Fact Sheet for Healthcare Providers:  seriousbroker.it  This test is no t yet approved or cleared by the [place]  FDA and  has been authorized for detection and/or diagnosis of SARS-CoV-2 by FDA under an Emergency Use Authorization (EUA). This EUA will remain  in effect (meaning this test can be used) for the duration of the COVID-19 declaration under Section 564(b)(1) of the Act, 21 U.S.C.section 360bbb-3(b)(1), unless the authorization is terminated  or revoked sooner.       Influenza A by PCR POSITIVE (A) NEGATIVE Final   Influenza B by PCR NEGATIVE NEGATIVE Final    Comment: (NOTE) The Xpert Xpress SARS-CoV-2/FLU/RSV plus assay is intended as an aid in the diagnosis of influenza from Nasopharyngeal swab specimens and should not be used as a sole basis for treatment. Nasal washings and aspirates are unacceptable for Xpert Xpress SARS-CoV-2/FLU/RSV testing.  Fact Sheet for Patients: bloggercourse.com  Fact Sheet for Healthcare  Providers: seriousbroker.it  This test is not yet approved or cleared by the [place]  FDA and has been authorized for detection and/or diagnosis of SARS-CoV-2 by FDA under an Emergency Use Authorization (EUA). This EUA will remain in effect (meaning this test can be used) for the duration of the COVID-19 declaration under Section 564(b)(1) of the Act, 21 U.S.C. section 360bbb-3(b)(1), unless the authorization is terminated or revoked.     Resp Syncytial Virus by PCR NEGATIVE NEGATIVE Final    Comment: (NOTE) Fact Sheet for Patients: bloggercourse.com  Fact Sheet for Healthcare Providers: seriousbroker.it  This test is not yet approved or cleared by the [place]  FDA and has been authorized for detection and/or diagnosis of SARS-CoV-2 by FDA under an Emergency Use Authorization (EUA). This EUA will remain in effect (meaning this test can be used) for the duration of the COVID-19 declaration under Section 564(b)(1) of the Act, 21 U.S.C. section 360bbb-3(b)(1), unless the authorization is terminated or revoked.  Performed at [facility], [address], [place], [place] [address]      Radiology Studies: DG Chest 2 View Result Date: 03/26/2024 EXAM: 2 VIEW(S)  XRAY OF THE CHEST 03/26/2024 05:16:49 PM COMPARISON: 10/26/2023 CLINICAL HISTORY: sob, wheezing FINDINGS: LUNGS AND PLEURA: No focal pulmonary opacity. No pleural effusion. No pneumothorax. HEART AND MEDIASTINUM: No acute abnormality of the cardiac and mediastinal silhouettes. BONES AND SOFT TISSUES: No acute osseous abnormality. IMPRESSION: 1. No acute process. Electronically signed by: Kevin Dover MD 03/26/2024 05:42 PM EST RP Workstation: [id number]    Scheduled Meds:  amiodarone   200 mg Oral Daily   atorvastatin   40 mg Oral Daily   guaiFENesin -dextromethorphan   15 mL Oral Q8H   ipratropium-albuterol   3 mL Nebulization Q8H    ipratropium-albuterol   3 mL Nebulization QID   levothyroxine   150 mcg Oral Q0600   methylPREDNISolone  (SOLU-MEDROL ) injection  60 mg Intravenous Q12H   Followed by   NOREEN ON 03/28/2024] predniSONE   40 mg Oral Q breakfast   metoprolol  succinate  25 mg Oral Daily   oseltamivir   30 mg Oral BID   pregabalin   75 mg Oral QHS   Rivaroxaban   15 mg Oral Daily   torsemide   40 mg Oral Daily   Continuous Infusions:   LOS: 1 day   Time spent: 56 mins  Orange Hilligoss Vicci, MD How to contact the [facility] Attending or Consulting provider 7A - 7P or covering provider during after hours 7P -7A, for this patient?  Check the care team in [facility] and look for a) attending/consulting TRH provider listed and b) the TRH team listed Log into [email or web address] to find provider on call.  Locate the TRH provider you are looking for under [facility] and page to a number that you can be directly reached. If you still have difficulty reaching the provider, please page the [facility] (Director on Call) for the Hospitalists listed on amion for assistance.  03/27/2024, 12:18 PM    "

## 2024-03-28 DIAGNOSIS — J9621 Acute and chronic respiratory failure with hypoxia: Secondary | ICD-10-CM | POA: Diagnosis not present

## 2024-03-28 DIAGNOSIS — I5032 Chronic diastolic (congestive) heart failure: Secondary | ICD-10-CM | POA: Diagnosis not present

## 2024-03-28 DIAGNOSIS — J441 Chronic obstructive pulmonary disease with (acute) exacerbation: Secondary | ICD-10-CM | POA: Diagnosis not present

## 2024-03-28 DIAGNOSIS — I480 Paroxysmal atrial fibrillation: Secondary | ICD-10-CM | POA: Diagnosis not present

## 2024-03-28 MED ORDER — IPRATROPIUM-ALBUTEROL 0.5-2.5 (3) MG/3ML IN SOLN
3.0000 mL | Freq: Three times a day (TID) | RESPIRATORY_TRACT | Status: AC
  Administered 2024-03-28 – 2024-03-29 (×5): 3 mL via RESPIRATORY_TRACT
  Filled 2024-03-28 (×4): qty 3

## 2024-03-28 NOTE — Plan of Care (Signed)

## 2024-03-28 NOTE — Progress Notes (Signed)
 " PROGRESS NOTE   Diane Cohen  FMW:[id number] DOB: 1946/02/12 DOA: 03/26/2024 PCP: Shona Norleen PEDLAR, MD   Chief Complaint  Patient presents with   Cough   Level of care: Telemetry  Brief Admission History:  79 y.o. female with medical history significant for diastolic CHF, atrial fibrillation on anticoagulation, COPD, hypertension.  Patient presented to the ED with complaints of difficulty breathing that started 3 days ago.  She reports productive cough, fevers, body aches, and difficulty breathing.  No chest pain.  She is supposed to be on O2 at night-but has not been using it because it broke. No vomiting no diarrhea no abdominal pain.  She has been compliant with Xarelto .  Patient was admitted for acute COPD exacerbation influenza A infection.   Assessment and Plan:  COPD with acute exacerbation with hypoxia 2/2 influenza A infection - symptoms of 3 days duration.  O2 sats down to 88% on room air, currently on 2 L.  At baseline she is on O2 at night,  has not been using it because it broke.  Diffuse coarse rhonchi on exam.  Chest x-ray clear. - IV Solu-Medrol  125 mg x 1 given, continue 60 twice daily - Tamiflu   30 mg BID  - DuoNebs as needed and scheduled - Mucolytics, flutter valve   Acute on chronic respiratory failure-plan per above - Will need home O2 arranged on discharge   Chronic diastolic CHF-stable and compensated.  Last echo 08/2023 EF 60 to 65%. - Reports compliance with torsemide , resume   Atrial fibrillation-rate controlled on anticoagulation with Xarelto .  Follows with Dr. Debera. - Resume Xarelto , amiodarone , metoprolol  - resume diltiazem  CD 180 mg daily    Hypertension -stable.  Continue multiple medications -including hydralazine , metoprolol , diltiazem , torsemide  - which she reports compliance with. - Resume home BP lowering meds   DVT prophylaxis: rivaroxaban  Code Status:  FULL  Family Communication:  Disposition: home in 1-2 days   Consultants:    Procedures:   Antimicrobials:    Subjective: Pt still feeling terrible with malaise, fatigue, cough and chest congestion.     Objective: Vitals:   03/28/24 0100 03/28/24 0121 03/28/24 0538 03/28/24 0800  BP:   (!) 148/82   Pulse:   76   Resp: (!) 27 (!) 23    Temp:   98.2 F (36.8 C)   TempSrc:   Oral   SpO2:   95% 98%  Weight:      Height:        Intake/Output Summary (Last 24 hours) at 03/28/2024 1232 Last data filed at 03/27/2024 1900 Gross per 24 hour  Intake 240 ml  Output --  Net 240 ml   Filed Weights   03/26/24 1617 03/26/24 2008  Weight: 72.6 kg 74.2 kg   Examination:  General exam: Appears calm and comfortable  Respiratory system: diffuse expiratory wheeze, no increased work of breathing.  Cardiovascular system: normal S1 & S2 heard. No JVD, murmurs, rubs, gallops or clicks. No pedal edema. Gastrointestinal system: Abdomen is nondistended, soft and nontender. No organomegaly or masses felt. Normal bowel sounds heard. Central nervous system: Alert and oriented. No focal neurological deficits. Extremities: Symmetric 5 x 5 power. Skin: No rashes, lesions or ulcers. Psychiatry: Judgement and insight appear normal. Mood & affect appropriate.   Data Reviewed: I have personally reviewed following labs and imaging studies  CBC: Recent Labs  Lab 03/26/24 1640  WBC 3.7*  NEUTROABS 2.7  HGB 11.9*  HCT 36.7  MCV 100.5*  PLT  144*    Basic Metabolic Panel: Recent Labs  Lab 03/26/24 1640 03/27/24 0427  NA 137 142  K 3.2* 3.9  CL 100 104  CO2 25 26  GLUCOSE 100* 114*  BUN 12 12  CREATININE 0.91 0.92  CALCIUM  9.1 9.7    CBG: No results for input(s): GLUCAP in the last 168 hours.  Recent Results (from the past 240 hours)  Resp panel by RT-PCR (RSV, Flu A&B, Covid) Anterior Nasal Swab     Status: Abnormal   Collection Time: 03/26/24  5:01 PM   Specimen: Anterior Nasal Swab  Result Value Ref Range Status   SARS Coronavirus 2 by RT PCR NEGATIVE  NEGATIVE Final    Comment: (NOTE) SARS-CoV-2 target nucleic acids are NOT DETECTED.  The SARS-CoV-2 RNA is generally detectable in upper respiratory specimens during the acute phase of infection. The lowest concentration of SARS-CoV-2 viral copies this assay can detect is 138 copies/mL. A negative result does not preclude SARS-Cov-2 infection and should not be used as the sole basis for treatment or other patient management decisions. A negative result may occur with  improper specimen collection/handling, submission of specimen other than nasopharyngeal swab, presence of viral mutation(s) within the areas targeted by this assay, and inadequate number of viral copies(<138 copies/mL). A negative result must be combined with clinical observations, patient history, and epidemiological information. The expected result is Negative.  Fact Sheet for Patients:  bloggercourse.com  Fact Sheet for Healthcare Providers:  seriousbroker.it  This test is no t yet approved or cleared by the [place]  FDA and  has been authorized for detection and/or diagnosis of SARS-CoV-2 by FDA under an Emergency Use Authorization (EUA). This EUA will remain  in effect (meaning this test can be used) for the duration of the COVID-19 declaration under Section 564(b)(1) of the Act, 21 U.S.C.section 360bbb-3(b)(1), unless the authorization is terminated  or revoked sooner.       Influenza A by PCR POSITIVE (A) NEGATIVE Final   Influenza B by PCR NEGATIVE NEGATIVE Final    Comment: (NOTE) The Xpert Xpress SARS-CoV-2/FLU/RSV plus assay is intended as an aid in the diagnosis of influenza from Nasopharyngeal swab specimens and should not be used as a sole basis for treatment. Nasal washings and aspirates are unacceptable for Xpert Xpress SARS-CoV-2/FLU/RSV testing.  Fact Sheet for Patients: bloggercourse.com  Fact Sheet for  Healthcare Providers: seriousbroker.it  This test is not yet approved or cleared by the [place]  FDA and has been authorized for detection and/or diagnosis of SARS-CoV-2 by FDA under an Emergency Use Authorization (EUA). This EUA will remain in effect (meaning this test can be used) for the duration of the COVID-19 declaration under Section 564(b)(1) of the Act, 21 U.S.C. section 360bbb-3(b)(1), unless the authorization is terminated or revoked.     Resp Syncytial Virus by PCR NEGATIVE NEGATIVE Final    Comment: (NOTE) Fact Sheet for Patients: bloggercourse.com  Fact Sheet for Healthcare Providers: seriousbroker.it  This test is not yet approved or cleared by the [place]  FDA and has been authorized for detection and/or diagnosis of SARS-CoV-2 by FDA under an Emergency Use Authorization (EUA). This EUA will remain in effect (meaning this test can be used) for the duration of the COVID-19 declaration under Section 564(b)(1) of the Act, 21 U.S.C. section 360bbb-3(b)(1), unless the authorization is terminated or revoked.  Performed at [facility], [address] [place], [place] [address]      Radiology Studies: DG Chest 2 View Result  Date: 03/26/2024 EXAM: 2 VIEW(S) XRAY OF THE CHEST 03/26/2024 05:16:49 PM COMPARISON: 10/26/2023 CLINICAL HISTORY: sob, wheezing FINDINGS: LUNGS AND PLEURA: No focal pulmonary opacity. No pleural effusion. No pneumothorax. HEART AND MEDIASTINUM: No acute abnormality of the cardiac and mediastinal silhouettes. BONES AND SOFT TISSUES: No acute osseous abnormality. IMPRESSION: 1. No acute process. Electronically signed by: Kevin Dover MD 03/26/2024 05:42 PM EST RP Workstation: [id number]   Scheduled Meds:  amiodarone   200 mg Oral Daily   atorvastatin   40 mg Oral Daily   dapagliflozin  propanediol  10 mg Oral QAC breakfast   diltiazem   180 mg Oral Daily   DULoxetine   60  mg Oral Daily   fluticasone  furoate-vilanterol  1 puff Inhalation Daily   ipratropium-albuterol   3 mL Nebulization TID   levothyroxine   137 mcg Oral Q0600   loratadine   10 mg Oral QPM   losartan   12.5 mg Oral Daily   metoprolol  succinate  25 mg Oral Daily   oseltamivir   30 mg Oral BID   predniSONE   40 mg Oral Q breakfast   pregabalin   75 mg Oral QHS   Rivaroxaban   15 mg Oral Daily   torsemide   40 mg Oral Daily   Continuous Infusions:   LOS: 2 days   Time spent: 55 mins  Laurelai Lepp Vicci, MD How to contact the [facility] Attending or Consulting provider 7A - 7P or covering provider during after hours 7P -7A, for this patient?  Check the care team in [facility] and look for a) attending/consulting TRH provider listed and b) the TRH team listed Log into [email or web address] to find provider on call.  Locate the TRH provider you are looking for under [facility] and page to a number that you can be directly reached. If you still have difficulty reaching the provider, please page the [facility] (Director on Call) for the Hospitalists listed on amion for assistance.  03/28/2024, 12:32 PM    "

## 2024-03-29 ENCOUNTER — Inpatient Hospital Stay (HOSPITAL_COMMUNITY)

## 2024-03-29 DIAGNOSIS — J441 Chronic obstructive pulmonary disease with (acute) exacerbation: Secondary | ICD-10-CM | POA: Diagnosis not present

## 2024-03-29 DIAGNOSIS — J101 Influenza due to other identified influenza virus with other respiratory manifestations: Secondary | ICD-10-CM | POA: Diagnosis not present

## 2024-03-29 DIAGNOSIS — J9621 Acute and chronic respiratory failure with hypoxia: Secondary | ICD-10-CM | POA: Diagnosis not present

## 2024-03-29 DIAGNOSIS — I5033 Acute on chronic diastolic (congestive) heart failure: Secondary | ICD-10-CM

## 2024-03-29 DIAGNOSIS — I480 Paroxysmal atrial fibrillation: Secondary | ICD-10-CM | POA: Diagnosis not present

## 2024-03-29 DIAGNOSIS — I5032 Chronic diastolic (congestive) heart failure: Secondary | ICD-10-CM | POA: Diagnosis not present

## 2024-03-29 MED ORDER — FUROSEMIDE 10 MG/ML IJ SOLN
40.0000 mg | Freq: Once | INTRAMUSCULAR | Status: AC
  Administered 2024-03-29: 40 mg via INTRAVENOUS
  Filled 2024-03-29: qty 4

## 2024-03-29 MED ORDER — METHYLPREDNISOLONE SODIUM SUCC 40 MG IJ SOLR
40.0000 mg | Freq: Two times a day (BID) | INTRAMUSCULAR | Status: DC
  Administered 2024-03-29 – 2024-04-01 (×6): 40 mg via INTRAVENOUS
  Filled 2024-03-29 (×6): qty 1

## 2024-03-29 MED ORDER — BUDESONIDE 0.5 MG/2ML IN SUSP
0.5000 mg | Freq: Two times a day (BID) | RESPIRATORY_TRACT | Status: DC
  Administered 2024-03-30 – 2024-04-01 (×5): 0.5 mg via RESPIRATORY_TRACT
  Filled 2024-03-29 (×5): qty 2

## 2024-03-29 MED ORDER — BENZONATATE 100 MG PO CAPS
200.0000 mg | ORAL_CAPSULE | Freq: Three times a day (TID) | ORAL | Status: DC
  Administered 2024-03-29 – 2024-04-01 (×8): 200 mg via ORAL
  Filled 2024-03-29 (×8): qty 2

## 2024-03-29 MED ORDER — ARFORMOTEROL TARTRATE 15 MCG/2ML IN NEBU
15.0000 ug | INHALATION_SOLUTION | Freq: Two times a day (BID) | RESPIRATORY_TRACT | Status: DC
  Administered 2024-03-30 – 2024-04-01 (×5): 15 ug via RESPIRATORY_TRACT
  Filled 2024-03-29 (×5): qty 2

## 2024-03-29 MED ORDER — ALBUTEROL SULFATE (2.5 MG/3ML) 0.083% IN NEBU
2.5000 mg | INHALATION_SOLUTION | Freq: Three times a day (TID) | RESPIRATORY_TRACT | Status: DC
  Administered 2024-03-30 (×3): 2.5 mg via RESPIRATORY_TRACT
  Filled 2024-03-29 (×3): qty 3

## 2024-03-29 MED ORDER — ALBUTEROL SULFATE (2.5 MG/3ML) 0.083% IN NEBU: 2.5000 mg | INHALATION_SOLUTION | Freq: Four times a day (QID) | RESPIRATORY_TRACT | Status: DC

## 2024-03-29 MED ORDER — ORAL CARE MOUTH RINSE: 15.0000 mL | OROMUCOSAL | Status: DC | PRN

## 2024-03-29 MED ORDER — REVEFENACIN 175 MCG/3ML IN SOLN
175.0000 ug | Freq: Every day | RESPIRATORY_TRACT | Status: DC
  Administered 2024-03-30 – 2024-04-01 (×3): 175 ug via RESPIRATORY_TRACT
  Filled 2024-03-29 (×3): qty 3

## 2024-03-29 NOTE — Plan of Care (Signed)

## 2024-03-29 NOTE — TOC Progression Note (Signed)
 Transition of Care [facility]) - Progression Note    Patient Details  Name: Diane Cohen MRN: [id number] Date of Birth: Jan 22, 1946  Transition of [facility] CM/SW Contact  Sharlyne Stabs, RN Phone Number: 03/29/2024, 11:24 AM  Clinical Narrative:   Patient admitted with COPD. PT had no PT follow up recommendations. Discharge planning for Friday.  Patient is on 3L of oxygen . Active with [employer] for night oxygen . She has not been using anything the past two months. States her bigger machine had stopped working and the smaller one she tried to use for the first time and couldn't get it to work. She contacted [employer] but states she lady was rude to her and never sent anyone out to her house. CM contacted Zach with [employer] to investigate this issue and update patient could potentially need continuous oxygen  at discharge. IPCM following.    Expected Discharge Plan: Home/Self Care Barriers to Discharge: Continued Medical Work up    Expected Discharge Plan and Services    Social Drivers of Health (SDOH) Interventions SDOH Screenings   Food Insecurity: No Food Insecurity (03/26/2024)  Housing: Low Risk (03/26/2024)  Transportation Needs: No Transportation Needs (03/26/2024)  Utilities: Not At Risk (03/26/2024)  Depression (PHQ2-9): Low Risk (10/07/2023)  Financial Resource Strain: Low Risk (10/07/2023)  Social Connections: Moderately Integrated (03/26/2024)  Stress: No Stress Concern Present (10/07/2023)  Tobacco Use: Medium Risk (03/26/2024)  Health Literacy: Adequate Health Literacy (10/07/2023)    Readmission Risk Interventions    08/20/2023   10:35 AM 08/19/2023   12:08 PM 08/18/2023    2:27 PM  Readmission Risk Prevention Plan  Transportation Screening Complete Complete Complete  Home Care Screening Complete Complete Complete  Medication Review (RN CM) Complete Complete Complete

## 2024-03-29 NOTE — Evaluation (Signed)
 Physical Therapy Evaluation Patient Details Name: Diane Cohen MRN: [id number] DOB: Aug 15, 1945 Today's Date: 03/29/2024  History of Present Illness  Grabiela B Hodak is a 79 y.o. female with medical history significant for diastolic CHF, atrial fibrillation on anticoagulation, COPD, hypertension.  Patient presented to the ED with complaints of difficulty breathing that started 3 days ago.  She reports productive cough, fevers, body aches, and difficulty breathing.  No chest pain.  She is supposed to be on O2 at night-but has not been using it because it broke. No vomiting no diarrhea no abdominal pain.  She has been compliant with Xarelto .   Clinical Impression  Patient functioning near baseline for functional mobility and gait other than SpO2 dropping from 93% to 85% ambulating on room air, otherwise demonstrates good return for ambulating in room without need for an AD. Patient encouraged to ambulate in room aid lib. Plan:  Patient discharged from physical therapy to care of nursing for ambulation daily as tolerated for length of stay.          If plan is discharge home, recommend the following: Help with stairs or ramp for entrance   Can travel by private vehicle        Equipment Recommendations None recommended by PT  Recommendations for Other Services       Functional Status Assessment Patient has not had a recent decline in their functional status     Precautions / Restrictions Precautions Precautions: None Recall of Precautions/Restrictions: Intact Restrictions Weight Bearing Restrictions Per Provider Order: No      Mobility  Bed Mobility Overal bed mobility: Independent                  Transfers Overall transfer level: Modified independent                 General transfer comment: slightly labored movement without need for an AD    Ambulation/Gait Ambulation/Gait assistance: Modified independent (Device/Increase time) Gait Distance (Feet): 80  Feet Assistive device: None Gait Pattern/deviations: WFL(Within Functional Limits) Gait velocity: slightly decreased     General Gait Details: grossly WFL with slightly labored movement ambulating in room without loss of balance or need for an AD, on room air with SpO2 dropping from 93% to 85%  Stairs            Wheelchair Mobility     Tilt Bed    Modified Rankin (Stroke Patients Only)       Balance Overall balance assessment: Independent                                           Pertinent Vitals/Pain Pain Assessment Pain Assessment: No/denies pain    Home Living Family/patient expects to be discharged to:: Private residence Living Arrangements: Alone Available Help at Discharge: Family;Available 24 hours/day Type of Home: House Home Access: Ramped entrance       Home Layout: One level Home Equipment: Grab bars - toilet;Grab bars - tub/shower;BSC/3in1;Rolling Walker (2 wheels);Cane - single point;Wheelchair - manual;Shower seat Additional Comments: No change since recent admission.    Prior Function Prior Level of Function : Independent/Modified Independent;Driving             Mobility Comments: drives, community ambulator without AD ADLs Comments: independent, PRN assist IADL's     Extremity/Trunk Assessment   Upper Extremity Assessment Upper Extremity Assessment: Defer to OT  evaluation RUE Deficits / Details: Baseline shoulder arthritis that limits flexion to ~50% A/ROM and P/ROM between 50 to 75% of available range. WFL otherwise.    Lower Extremity Assessment Lower Extremity Assessment: Overall WFL for tasks assessed    Cervical / Trunk Assessment Cervical / Trunk Assessment: Normal  Communication   Communication Communication: No apparent difficulties    Cognition Arousal: Alert Behavior During Therapy: WFL for tasks assessed/performed                             Following commands: Intact        Cueing Cueing Techniques: Verbal cues     General Comments General comments (skin integrity, edema, etc.): Pt hovering around 91 to 93% SpO2 while ambulating, but did desaurate to 87 at times and required 0.5 LPM via supplemental O2 to increase to 90 and above at the end of session. Pt left on .5 LPM of O2.    Exercises     Assessment/Plan    PT Assessment Patient does not need any further PT services  PT Problem List         PT Treatment Interventions      PT Goals (Current goals can be found in the Care Plan section)  Acute Rehab PT Goals Patient Stated Goal: return home PT Goal Formulation: With patient Time For Goal Achievement: 03/29/24 Potential to Achieve Goals: Good    Frequency       Co-evaluation PT/OT/SLP Co-Evaluation/Treatment: Yes Reason for Co-Treatment: To address functional/ADL transfers PT goals addressed during session: Mobility/safety with mobility;Balance OT goals addressed during session: ADL's and self-care       AM-PAC PT 6 Clicks Mobility  Outcome Measure Help needed turning from your back to your side while in a flat bed without using bedrails?: None Help needed moving from lying on your back to sitting on the side of a flat bed without using bedrails?: None Help needed moving to and from a bed to a chair (including a wheelchair)?: None Help needed standing up from a chair using your arms (e.g., wheelchair or bedside chair)?: None Help needed to walk in hospital room?: None Help needed climbing 3-5 steps with a railing? : A Little 6 Click Score: 23    End of Session   Activity Tolerance: Patient tolerated treatment well;Patient limited by fatigue Patient left: in chair;with call bell/phone within reach Nurse Communication: Mobility status PT Visit Diagnosis: Unsteadiness on feet (R26.81);Other abnormalities of gait and mobility (R26.89);Muscle weakness (generalized) (M62.81)    Time: 9159-9090 PT Time Calculation (min) (ACUTE ONLY):  29 min   Charges:   PT Evaluation $PT Eval Moderate Complexity: 1 Mod PT Treatments $Therapeutic Activity: 23-37 mins PT General Charges $$ ACUTE PT VISIT: 1 Visit         12:05 PM, 03/29/24 Lynwood Music, MPT Physical Therapist with [facility] 336 [phone] office [phone] mobile phone

## 2024-03-29 NOTE — Progress Notes (Signed)
 "          PROGRESS NOTE  PRAKRITI CARIGNAN FMW:[id number] DOB: 1945/09/13 DOA: 03/26/2024 PCP: Shona Norleen PEDLAR, MD  Brief History:  79 y.o. female with medical history significant for diastolic CHF, atrial fibrillation on anticoagulation, COPD, hypertension and HLD.  Patient presented to the ED with complaints of difficulty breathing that started 3 days ago.  She reports productive cough, fevers, body aches, and difficulty breathing.  No chest pain.  She is supposed to be on 2L  O2 at night-but has not been using it because it broke. No vomiting no diarrhea no abdominal pain.  She has been compliant with Xarelto .  Patient was admitted for acute COPD exacerbation influenza A infection. She was started on IV Solu-Medrol  and DuoNebs.  She was continued on her home dose of torsemide .  She was noted to have signs of fluid overload and was treated with spot dosing of IV furosemide  during hospitalization.  Her oxygen  was gradually weaned.  Acute on chronic respiratory failure with hypoxia -due to COPD exacerbation, pneumonia and CHF -On 2 L nasal cannula at nighttime only at home -initially requiring 3 L nasal cannula while awake -wean oxygen  for saturation >92% -desaturated to 86% on RA with ambulation on day of d/c>>d/c home with 2L   Lobar pneumonia -Sepsis ruled out -Elevated lactate thought secondary to hypoxia  -Continue Ceftriaxone /Azithromycin . -finished 7 days ceftriaxone  and azithro during hospitalization   CKD stage 3a, GFR 45-59 ml/min -baseline creatinine 0.9-1.2 -serum creatinine 1.04 on day of d/c   COPD with acute exacerbation  -added brovana  -added Yulpelri -continue albuterol  nebs -continue solumedrol -d/c home with prednisone  taper   Acute on chronic diastolic CHF (congestive heart failure) -08/14/23 Echo EF 60-65%, no wMA, normal RVF, trivial MR -continue metoprolol  and dapagliflozin   -S/p lasix  IV on 6/7, resumed daily home oral torsemide  on 6/9 -redose lasix  x 1 on 6/11 and  6/12 -ReDS = 34 on day of d/c -dc with increase dose torsemide  (40 mg) as pt stated she occasionally took extra 20 mg (above baseline 20 mg daily) -weight 178>>174 -continue Farxiga    Paroxysmal atrial fibrillation -Continue amiodarone , metoprolol  and diltiazem  for rate control -continue Xarelto    Hypothyroidism Continue levothyroxine     Essential hypertension Hypertensive Urgency - resolved  -improved after resuming home metoprolol  and diltiazem .  -hydralazine  added    Assessment/Plan:  Acute on chronic respiratory failure with hypoxia - Secondary to COPD exacerbation and influenza - Patient is on 2 L at nighttime - Presented with oxygen  saturation 88% on room air and tachypnea - Stable on 2 L - Continue oseltamavir  COPD exacerbation - Restart IV Solu-Medrol  - Start Yupelri  - Start Brovana  - Start Pulmicort  - Continue DuoNebs  Acute on chronic HFpEF - Has some signs of fluid overload on exam - Give spot dose IV furosemide  - 6/25 echo EF 60 to 65%, no WMA, normal RVF, trivial MR - 03/29/2024 weight 158.1 pounds - Daily weights  Paroxysmal atrial fibrillation - Continue Xarelto  - Continue metoprolol  succinate, amiodarone , Cardizem  CD  Essential hypertension - Continuing losartan , metoprolol  succinate, Cardizem  CD  Hypothyroidism - Continue Synthroid   CKD stage III A - Baseline creatinine 0.9-1.2 - Monitor BMP with diuresis     Family Communication: no  Family at bedside  Consultants:  none  Code Status:  FULL   DVT Prophylaxis:  Xarelto    Procedures: As Listed in Progress Note Above  Antibiotics: None        Subjective: Patient states that  she is breathing a bit better.  She still has cough.  She denies any nausea, vomiting, diarrhea, abdominal pain.  She denies any dysuria or hematuria.  There is no hematochezia.  Objective: Vitals:   03/29/24 0709 03/29/24 1242 03/29/24 1312 03/29/24 1552  BP:   (!) 120/58   Pulse:   73   Resp:    17   Temp:      TempSrc:      SpO2: 96% 100% 94%   Weight:    71.7 kg  Height:        Intake/Output Summary (Last 24 hours) at 03/29/2024 1709 Last data filed at 03/29/2024 1300 Gross per 24 hour  Intake 720 ml  Output --  Net 720 ml   Weight change:  Exam:  General:  Pt is alert, follows commands appropriately, not in acute distress HEENT: No icterus, No thrush, No neck mass, [place]/AT Cardiovascular: RRR, S1/S2, no rubs, no gallops +JVD Respiratory: Scattered bilateral crackles.  Bibasilar wheeze. Abdomen: Soft/+BS, non tender, non distended, no guarding Extremities: trace LE edema, No lymphangitis, No petechiae, No rashes, no synovitis   Data Reviewed: I have personally reviewed following labs and imaging studies Basic Metabolic Panel: Recent Labs  Lab 03/26/24 1640 03/27/24 0427  NA 137 142  K 3.2* 3.9  CL 100 104  CO2 25 26  GLUCOSE 100* 114*  BUN 12 12  CREATININE 0.91 0.92  CALCIUM  9.1 9.7   Liver Function Tests: No results for input(s): AST, ALT, ALKPHOS, BILITOT, PROT, ALBUMIN in the last 168 hours. No results for input(s): LIPASE, AMYLASE in the last 168 hours. No results for input(s): AMMONIA in the last 168 hours. Coagulation Profile: No results for input(s): INR, PROTIME in the last 168 hours. CBC: Recent Labs  Lab 03/26/24 1640  WBC 3.7*  NEUTROABS 2.7  HGB 11.9*  HCT 36.7  MCV 100.5*  PLT 144*   Cardiac Enzymes: No results for input(s): CKTOTAL, CKMB, CKMBINDEX, TROPONINI in the last 168 hours. BNP: Invalid input(s): POCBNP CBG: No results for input(s): GLUCAP in the last 168 hours. HbA1C: No results for input(s): HGBA1C in the last 72 hours. Urine analysis:    Component Value Date/Time   COLORURINE YELLOW 12/28/2013 1706   APPEARANCEUR CLEAR 12/28/2013 1706   LABSPEC <1.005 (L) 12/28/2013 1706   PHURINE 5.5 12/28/2013 1706   GLUCOSEU NEGATIVE 12/28/2013 1706   HGBUR NEGATIVE 12/28/2013 1706    BILIRUBINUR NEGATIVE 12/28/2013 1706   KETONESUR NEGATIVE 12/28/2013 1706   PROTEINUR NEGATIVE 12/28/2013 1706   UROBILINOGEN 0.2 12/28/2013 1706   NITRITE NEGATIVE 12/28/2013 1706   LEUKOCYTESUR NEGATIVE 12/28/2013 1706   Sepsis Labs: @LABRCNTIP (procalcitonin:4,lacticidven:4) ) Recent Results (from the past 240 hours)  Resp panel by RT-PCR (RSV, Flu A&B, Covid) Anterior Nasal Swab     Status: Abnormal   Collection Time: 03/26/24  5:01 PM   Specimen: Anterior Nasal Swab  Result Value Ref Range Status   SARS Coronavirus 2 by RT PCR NEGATIVE NEGATIVE Final    Comment: (NOTE) SARS-CoV-2 target nucleic acids are NOT DETECTED.  The SARS-CoV-2 RNA is generally detectable in upper respiratory specimens during the acute phase of infection. The lowest concentration of SARS-CoV-2 viral copies this assay can detect is 138 copies/mL. A negative result does not preclude SARS-Cov-2 infection and should not be used as the sole basis for treatment or other patient management decisions. A negative result may occur with  improper specimen collection/handling, submission of specimen other than nasopharyngeal swab, presence of viral  mutation(s) within the areas targeted by this assay, and inadequate number of viral copies(<138 copies/mL). A negative result must be combined with clinical observations, patient history, and epidemiological information. The expected result is Negative.  Fact Sheet for Patients:  bloggercourse.com  Fact Sheet for Healthcare Providers:  seriousbroker.it  This test is no t yet approved or cleared by the [place]  FDA and  has been authorized for detection and/or diagnosis of SARS-CoV-2 by FDA under an Emergency Use Authorization (EUA). This EUA will remain  in effect (meaning this test can be used) for the duration of the COVID-19 declaration under Section 564(b)(1) of the Act, 21 U.S.C.section 360bbb-3(b)(1),  unless the authorization is terminated  or revoked sooner.       Influenza A by PCR POSITIVE (A) NEGATIVE Final   Influenza B by PCR NEGATIVE NEGATIVE Final    Comment: (NOTE) The Xpert Xpress SARS-CoV-2/FLU/RSV plus assay is intended as an aid in the diagnosis of influenza from Nasopharyngeal swab specimens and should not be used as a sole basis for treatment. Nasal washings and aspirates are unacceptable for Xpert Xpress SARS-CoV-2/FLU/RSV testing.  Fact Sheet for Patients: bloggercourse.com  Fact Sheet for Healthcare Providers: seriousbroker.it  This test is not yet approved or cleared by the [place]  FDA and has been authorized for detection and/or diagnosis of SARS-CoV-2 by FDA under an Emergency Use Authorization (EUA). This EUA will remain in effect (meaning this test can be used) for the duration of the COVID-19 declaration under Section 564(b)(1) of the Act, 21 U.S.C. section 360bbb-3(b)(1), unless the authorization is terminated or revoked.     Resp Syncytial Virus by PCR NEGATIVE NEGATIVE Final    Comment: (NOTE) Fact Sheet for Patients: bloggercourse.com  Fact Sheet for Healthcare Providers: seriousbroker.it  This test is not yet approved or cleared by the [place]  FDA and has been authorized for detection and/or diagnosis of SARS-CoV-2 by FDA under an Emergency Use Authorization (EUA). This EUA will remain in effect (meaning this test can be used) for the duration of the COVID-19 declaration under Section 564(b)(1) of the Act, 21 U.S.C. section 360bbb-3(b)(1), unless the authorization is terminated or revoked.  Performed at [facility], [address], [place], [place] [address]      Scheduled Meds:  amiodarone   200 mg Oral Daily   atorvastatin   40 mg Oral Daily   dapagliflozin  propanediol  10 mg Oral QAC breakfast   diltiazem   180 mg Oral Daily    DULoxetine   60 mg Oral Daily   fluticasone  furoate-vilanterol  1 puff Inhalation Daily   furosemide   40 mg Intravenous Once   ipratropium-albuterol   3 mL Nebulization TID   levothyroxine   137 mcg Oral Q0600   loratadine   10 mg Oral QPM   losartan   12.5 mg Oral Daily   metoprolol  succinate  25 mg Oral Daily   oseltamivir   30 mg Oral BID   predniSONE   40 mg Oral Q breakfast   pregabalin   75 mg Oral QHS   Rivaroxaban   15 mg Oral Daily   torsemide   40 mg Oral Daily   Continuous Infusions:  Procedures/Studies: DG CHEST PORT 1 VIEW Result Date: 03/29/2024 EXAM: 1 VIEW XRAY OF THE CHEST 03/29/2024 04:41:00 AM COMPARISON: AP and lateral chest 03/26/2024. CLINICAL HISTORY: FINDINGS: LUNGS AND PLEURA: Mild chronic changes of the lungs, but no focal consolidation is seen. Scattered linear Scarring is present. The lungs are hypoexpanded. HEART AND MEDIASTINUM: Mild cardiomegaly. Central vascular prominence is again noted, but no  overt edema. There is a tortuous aorta with atherosclerosis and stable mediastinum. BONES AND SOFT TISSUES: Osteopenia with thoracic spondylosis. The patient is rotated to the right. IMPRESSION: 1. No focal consolidation or overt pulmonary edema. 2. Mild chronic lung changes with scarring and hypoexpanded lungs. 3. Mild cardiomegaly and central vascular prominence. Similar findings previously. Electronically signed by: Francis Quam MD 03/29/2024 05:39 AM EST RP Workstation: [id number]   DG Chest 2 View Result Date: 03/26/2024 EXAM: 2 VIEW(S) XRAY OF THE CHEST 03/26/2024 05:16:49 PM COMPARISON: 10/26/2023 CLINICAL HISTORY: sob, wheezing FINDINGS: LUNGS AND PLEURA: No focal pulmonary opacity. No pleural effusion. No pneumothorax. HEART AND MEDIASTINUM: No acute abnormality of the cardiac and mediastinal silhouettes. BONES AND SOFT TISSUES: No acute osseous abnormality. IMPRESSION: 1. No acute process. Electronically signed by: Franky Crease MD 03/26/2024 05:42 PM EST RP Workstation:  [id number]    Alm Schneider, DO  [facility]  If 7PM-7AM, please contact night-coverage [email or web address] Password [email or web address] 03/29/2024, 5:09 PM   LOS: 3 days   "

## 2024-03-29 NOTE — Evaluation (Signed)
 Occupational Therapy Evaluation Patient Details Name: Diane Cohen MRN: [id number] DOB: June 28, 1945 Today's Date: 03/29/2024   History of Present Illness   Diane Cohen is a 79 y.o. female with medical history significant for diastolic CHF, atrial fibrillation on anticoagulation, COPD, hypertension.  Patient presented to the ED with complaints of difficulty breathing that started 3 days ago.  She reports productive cough, fevers, body aches, and difficulty breathing.  No chest pain.  She is supposed to be on O2 at night-but has not been using it because it broke. No vomiting no diarrhea no abdominal pain.  She has been compliant with Xarelto . (per MD)     Clinical Impressions Pt agreeable to OT and PT co-evaluation. Pt did not require physical assist for mobility or ADL's today. R shoulder limited at baseline with ability to complete ADL's well. Pt needing supplemental O2 to stay at 90% SpO2 or above at the end of session. See below for details of O2 status. Pt left in the chair with call bell within reach. Pt will benefit from continued OT in the hospital to increase strength, balance, and endurance for safe ADL's.                 Functional Status Assessment   Patient has not had a recent decline in their functional status     Equipment Recommendations   None recommended by OT             Precautions/Restrictions   Precautions Precautions: Fall Recall of Precautions/Restrictions: Intact Restrictions Weight Bearing Restrictions Per Provider Order: No     Mobility Bed Mobility Overal bed mobility: Independent                  Transfers Overall transfer level: Modified independent                 General transfer comment: No AD or physical assist needed; mildly limited by SOB/O2 saturation.      Balance Overall balance assessment: Independent                                         ADL either performed or assessed with  clinical judgement   ADL Overall ADL's : Modified independent                                       General ADL Comments: Mildly limited by cardiopulmonary status, but in no need of AD or physical assist for ADL tasks.     Vision Baseline Vision/History: 1 Wears glasses Ability to See in Adequate Light: 0 Adequate Patient Visual Report: No change from baseline Vision Assessment?: No apparent visual deficits     Perception Perception: Not tested       Praxis Praxis: Not tested       Pertinent Vitals/Pain Pain Assessment Pain Assessment: No/denies pain     Extremity/Trunk Assessment Upper Extremity Assessment Upper Extremity Assessment: RUE deficits/detail RUE Deficits / Details: Baseline shoulder arthritis that limits flexion to ~50% A/ROM and P/ROM between 50 to 75% of available range. WFL otherwise.   Lower Extremity Assessment Lower Extremity Assessment: Defer to PT evaluation   Cervical / Trunk Assessment Cervical / Trunk Assessment: Normal   Communication Communication Communication: No apparent difficulties   Cognition Arousal: Alert Behavior During Therapy: [facility]  for tasks assessed/performed Cognition: No apparent impairments                               Following commands: Intact       Cueing  General Comments   Cueing Techniques: Verbal cues  Pt hovering around 91 to 93% SpO2 while ambulating, but did desaurate to 87 at times and required 0.5 LPM via supplemental O2 to increase to 90 and above at the end of session. Pt left on .5 LPM of O2.              Home Living Family/patient expects to be discharged to:: Private residence Living Arrangements: Alone Available Help at Discharge: Family;Available 24 hours/day Type of Home: House Home Access: Ramped entrance     Home Layout: One level     Bathroom Shower/Tub: Producer, Television/film/video: Handicapped height Bathroom Accessibility: Yes How Accessible:  Accessible via wheelchair;Accessible via walker Home Equipment: Grab bars - toilet;Grab bars - tub/shower;BSC/3in1;Rolling Walker (2 wheels);Cane - single point;Wheelchair - manual;Shower seat   Additional Comments: No change since recent admission.      Prior Functioning/Environment Prior Level of Function : Independent/Modified Independent             Mobility Comments: drives, community ambulator ADLs Comments: independent, PRN assist IADL's                            Co-evaluation PT/OT/SLP Co-Evaluation/Treatment: Yes Reason for Co-Treatment: To address functional/ADL transfers   OT goals addressed during session: ADL's and self-care                       End of Session Equipment Utilized During Treatment: Oxygen   Activity Tolerance: Patient tolerated treatment well Patient left: in chair;with call bell/phone within reach  OT Visit Diagnosis: Unsteadiness on feet (R26.81);Other abnormalities of gait and mobility (R26.89)                Time: 9141-9088 OT Time Calculation (min): 13 min Charges:  OT General Charges $OT Visit: 1 Visit OT Evaluation $OT Eval Low Complexity: 1 Low  Ebonee Stober OT, MOT  Jayson Person 03/29/2024, 10:34 AM

## 2024-03-30 DIAGNOSIS — J101 Influenza due to other identified influenza virus with other respiratory manifestations: Secondary | ICD-10-CM | POA: Diagnosis not present

## 2024-03-30 DIAGNOSIS — J441 Chronic obstructive pulmonary disease with (acute) exacerbation: Secondary | ICD-10-CM | POA: Diagnosis not present

## 2024-03-30 DIAGNOSIS — I5033 Acute on chronic diastolic (congestive) heart failure: Secondary | ICD-10-CM | POA: Diagnosis not present

## 2024-03-30 DIAGNOSIS — I480 Paroxysmal atrial fibrillation: Secondary | ICD-10-CM | POA: Diagnosis not present

## 2024-03-30 LAB — BASIC METABOLIC PANEL WITH GFR
Anion gap: 14 (ref 5–15)
BUN: 35 mg/dL — ABNORMAL HIGH (ref 8–23)
CO2: 30 mmol/L (ref 22–32)
Calcium: 9.4 mg/dL (ref 8.9–10.3)
Chloride: 97 mmol/L — ABNORMAL LOW (ref 98–111)
Creatinine, Ser: 1.29 mg/dL — ABNORMAL HIGH (ref 0.44–1.00)
GFR, Estimated: 42 mL/min — ABNORMAL LOW
Glucose, Bld: 118 mg/dL — ABNORMAL HIGH (ref 70–99)
Potassium: 3.1 mmol/L — ABNORMAL LOW (ref 3.5–5.1)
Sodium: 142 mmol/L (ref 135–145)

## 2024-03-30 LAB — CBC
HCT: 39.2 % (ref 36.0–46.0)
Hemoglobin: 12.6 g/dL (ref 12.0–15.0)
MCH: 32.2 pg (ref 26.0–34.0)
MCHC: 32.1 g/dL (ref 30.0–36.0)
MCV: 100.3 fL — ABNORMAL HIGH (ref 80.0–100.0)
Platelets: 160 K/uL (ref 150–400)
RBC: 3.91 MIL/uL (ref 3.87–5.11)
RDW: 15.1 % (ref 11.5–15.5)
WBC: 4.7 K/uL (ref 4.0–10.5)
nRBC: 0 % (ref 0.0–0.2)

## 2024-03-30 LAB — MAGNESIUM: Magnesium: 2.2 mg/dL (ref 1.7–2.4)

## 2024-03-30 MED ORDER — ALBUTEROL SULFATE (2.5 MG/3ML) 0.083% IN NEBU
2.5000 mg | INHALATION_SOLUTION | Freq: Four times a day (QID) | RESPIRATORY_TRACT | Status: DC
  Administered 2024-03-31 – 2024-04-01 (×5): 2.5 mg via RESPIRATORY_TRACT
  Filled 2024-03-30 (×4): qty 3

## 2024-03-30 MED ORDER — POTASSIUM CHLORIDE CRYS ER 20 MEQ PO TBCR
40.0000 meq | EXTENDED_RELEASE_TABLET | Freq: Once | ORAL | Status: AC
  Administered 2024-03-30: 40 meq via ORAL
  Filled 2024-03-30: qty 2

## 2024-03-30 NOTE — TOC Progression Note (Signed)
 Transition of Care [facility]) - Progression Note    Patient Details  Name: BAMBI FEHNEL MRN: [id number] Date of Birth: [birth year]  Transition of Care [facility]) CM/SW Contact  Sharlyne Stabs, RN Phone Number: 03/30/2024, 4:04 PM  Clinical Narrative:   Darlyn with [employer] confirmed the plan is for someone to go to the home to swap out her machine tomorrow. Team updated.    Expected Discharge Plan: Home/Self Care Barriers to Discharge: Continued Medical Work up    Expected Discharge Plan and Services      Social Drivers of Health (SDOH) Interventions SDOH Screenings   Food Insecurity: No Food Insecurity (03/26/2024)  Housing: Low Risk (03/26/2024)  Transportation Needs: No Transportation Needs (03/26/2024)  Utilities: Not At Risk (03/26/2024)  Depression (PHQ2-9): Low Risk (10/07/2023)  Financial Resource Strain: Low Risk (10/07/2023)  Social Connections: Moderately Integrated (03/26/2024)  Stress: No Stress Concern Present (10/07/2023)  Tobacco Use: Medium Risk (03/26/2024)  Health Literacy: Adequate Health Literacy (10/07/2023)    Readmission Risk Interventions    08/20/2023   10:35 AM 08/19/2023   12:08 PM 08/18/2023    2:27 PM  Readmission Risk Prevention Plan  Transportation Screening Complete Complete Complete  Home Care Screening Complete Complete Complete  Medication Review (RN CM) Complete Complete Complete

## 2024-03-30 NOTE — Plan of Care (Signed)

## 2024-03-30 NOTE — Progress Notes (Signed)
 Mobility Specialist Progress Note:    03/30/24 1025  Mobility  Activity Ambulated with assistance  Level of Assistance Modified independent, requires aide device or extra time  Assistive Device None  Distance Ambulated (ft) 120 ft  Range of Motion/Exercises Active;All extremities  Activity Response Tolerated well  Mobility Referral Yes  Mobility visit 1 Mobility  Mobility Specialist Start Time (ACUTE ONLY) 1025  Mobility Specialist Stop Time (ACUTE ONLY) 1045  Mobility Specialist Time Calculation (min) (ACUTE ONLY) 20 min   Pt received in bed, agreeable to mobility. ModI to stand and ambulate with no AD. Tolerated well, SpO2 92% on 0.5L at rest. During ambulation SpO2 88-92% on 0.5L, required 1L to bring O2 up to 90% during ambulation. Pt stated she was a little SOB near EOS. Left in chair, all needs met.  Karmella Bouvier Mobility Specialist Please contact via Special Educational Needs Teacher or  Rehab office at [phone]

## 2024-03-30 NOTE — Progress Notes (Signed)
 "          PROGRESS NOTE  Diane Cohen FMW:[id number] DOB: Oct 17, 1945 DOA: 03/26/2024 PCP: Shona Norleen PEDLAR, MD  Brief History:  79 y.o. female with medical history significant for diastolic CHF, atrial fibrillation on anticoagulation, COPD, hypertension and HLD.  Patient presented to the ED with complaints of difficulty breathing that started 3 days ago.  She reports productive cough, fevers, body aches, and difficulty breathing.  No chest pain.  She is supposed to be on 2L  O2 at night-but has not been using it because it broke. No vomiting no diarrhea no abdominal pain.  She has been compliant with Xarelto .  Patient was admitted for acute COPD exacerbation influenza A infection. She was started on IV Solu-Medrol  and DuoNebs.  She was continued on her home dose of torsemide .  She was noted to have signs of fluid overload and was treated with spot dosing of IV furosemide  during hospitalization.  Her oxygen  was gradually weaned.     Assessment/Plan: Acute on chronic respiratory failure with hypoxia - Secondary to COPD exacerbation and influenza - Patient is on 2 L at nighttime - Presented with oxygen  saturation 88% on room air and tachypnea - Stable on 2 L - Continue oseltamavir--last dose on 1/23 AM   COPD exacerbation - Restart IV Solu-Medrol  - Start Yupelri  - Start Brovana  - Start Pulmicort  - Continue DuoNebs   Acute on chronic HFpEF - Has some signs of fluid overload on exam - Give spot dose IV furosemide  1/21 - 6/25 echo EF 60 to 65%, no WMA, normal RVF, trivial MR - 03/29/2024 weight 158.1 pounds - 03/30/24 weight 155.6 lb - Daily weights--NEg 3 lbs   Paroxysmal atrial fibrillation - Continue Xarelto  - Continue metoprolol  succinate, amiodarone , Cardizem  CD   Essential hypertension - Continuing losartan , metoprolol  succinate, Cardizem  CD   Hypothyroidism - Continue Synthroid    CKD stage III A - Baseline creatinine 0.9-1.2 - Monitor BMP with  diuresis  Hypokalemia -replete -check mag 2.2          Family Communication:   no Family at bedside  Consultants:  none  Code Status:  FULL   DVT Prophylaxis: xarelto    Procedures: As Listed in Progress Note Above  Antibiotics: None      Subjective: Pt states she is breathing better, but continues to complain of wheeze and dry cough.  Denies cp, n/v/d, abd pain  Objective: Vitals:   03/30/24 0442 03/30/24 0803 03/30/24 0900 03/30/24 1350  BP: 139/68  (!) 141/70 128/63  Pulse: 66   77  Resp: 18     Temp: 97.9 F (36.6 C)   97.8 F (36.6 C)  TempSrc: Oral   Oral  SpO2: 93% 93%  95%  Weight: 70.6 kg     Height:        Intake/Output Summary (Last 24 hours) at 03/30/2024 1753 Last data filed at 03/30/2024 1300 Gross per 24 hour  Intake 480 ml  Output --  Net 480 ml   Weight change:  Exam:  General:  Pt is alert, follows commands appropriately, not in acute distress HEENT: No icterus, No thrush, No neck mass, [place]/AT Cardiovascular: RRR, S1/S2, no rubs, no gallops Respiratory: bibasilar rales.  Bibasilar wheeze Abdomen: Soft/+BS, non tender, non distended, no guarding Extremities: No edema, No lymphangitis, No petechiae, No rashes, no synovitis   Data Reviewed: I have personally reviewed following labs and imaging studies Basic Metabolic Panel: Recent Labs  Lab 03/26/24 1640 03/27/24 0427 03/30/24 0407  NA 137 142 142  K 3.2* 3.9 3.1*  CL 100 104 97*  CO2 25 26 30   GLUCOSE 100* 114* 118*  BUN 12 12 35*  CREATININE 0.91 0.92 1.29*  CALCIUM  9.1 9.7 9.4  MG  --   --  2.2   Liver Function Tests: No results for input(s): AST, ALT, ALKPHOS, BILITOT, PROT, ALBUMIN in the last 168 hours. No results for input(s): LIPASE, AMYLASE in the last 168 hours. No results for input(s): AMMONIA in the last 168 hours. Coagulation Profile: No results for input(s): INR, PROTIME in the last 168 hours. CBC: Recent Labs  Lab  03/26/24 1640 03/30/24 0407  WBC 3.7* 4.7  NEUTROABS 2.7  --   HGB 11.9* 12.6  HCT 36.7 39.2  MCV 100.5* 100.3*  PLT 144* 160   Cardiac Enzymes: No results for input(s): CKTOTAL, CKMB, CKMBINDEX, TROPONINI in the last 168 hours. BNP: Invalid input(s): POCBNP CBG: No results for input(s): GLUCAP in the last 168 hours. HbA1C: No results for input(s): HGBA1C in the last 72 hours. Urine analysis:    Component Value Date/Time   COLORURINE YELLOW 12/28/2013 1706   APPEARANCEUR CLEAR 12/28/2013 1706   LABSPEC <1.005 (L) 12/28/2013 1706   PHURINE 5.5 12/28/2013 1706   GLUCOSEU NEGATIVE 12/28/2013 1706   HGBUR NEGATIVE 12/28/2013 1706   BILIRUBINUR NEGATIVE 12/28/2013 1706   KETONESUR NEGATIVE 12/28/2013 1706   PROTEINUR NEGATIVE 12/28/2013 1706   UROBILINOGEN 0.2 12/28/2013 1706   NITRITE NEGATIVE 12/28/2013 1706   LEUKOCYTESUR NEGATIVE 12/28/2013 1706   Sepsis Labs: @LABRCNTIP (procalcitonin:4,lacticidven:4) ) Recent Results (from the past 240 hours)  Resp panel by RT-PCR (RSV, Flu A&B, Covid) Anterior Nasal Swab     Status: Abnormal   Collection Time: 03/26/24  5:01 PM   Specimen: Anterior Nasal Swab  Result Value Ref Range Status   SARS Coronavirus 2 by RT PCR NEGATIVE NEGATIVE Final    Comment: (NOTE) SARS-CoV-2 target nucleic acids are NOT DETECTED.  The SARS-CoV-2 RNA is generally detectable in upper respiratory specimens during the acute phase of infection. The lowest concentration of SARS-CoV-2 viral copies this assay can detect is 138 copies/mL. A negative result does not preclude SARS-Cov-2 infection and should not be used as the sole basis for treatment or other patient management decisions. A negative result may occur with  improper specimen collection/handling, submission of specimen other than nasopharyngeal swab, presence of viral mutation(s) within the areas targeted by this assay, and inadequate number of viral copies(<138 copies/mL). A  negative result must be combined with clinical observations, patient history, and epidemiological information. The expected result is Negative.  Fact Sheet for Patients:  bloggercourse.com  Fact Sheet for Healthcare Providers:  seriousbroker.it  This test is no t yet approved or cleared by the [place]  FDA and  has been authorized for detection and/or diagnosis of SARS-CoV-2 by FDA under an Emergency Use Authorization (EUA). This EUA will remain  in effect (meaning this test can be used) for the duration of the COVID-19 declaration under Section 564(b)(1) of the Act, 21 U.S.C.section 360bbb-3(b)(1), unless the authorization is terminated  or revoked sooner.       Influenza A by PCR POSITIVE (A) NEGATIVE Final   Influenza B by PCR NEGATIVE NEGATIVE Final    Comment: (NOTE) The Xpert Xpress SARS-CoV-2/FLU/RSV plus assay is intended as an aid in the diagnosis of influenza from Nasopharyngeal swab specimens and should not be used as a sole basis for treatment. Nasal washings and aspirates are unacceptable for Xpert  Xpress SARS-CoV-2/FLU/RSV testing.  Fact Sheet for Patients: bloggercourse.com  Fact Sheet for Healthcare Providers: seriousbroker.it  This test is not yet approved or cleared by the [place]  FDA and has been authorized for detection and/or diagnosis of SARS-CoV-2 by FDA under an Emergency Use Authorization (EUA). This EUA will remain in effect (meaning this test can be used) for the duration of the COVID-19 declaration under Section 564(b)(1) of the Act, 21 U.S.C. section 360bbb-3(b)(1), unless the authorization is terminated or revoked.     Resp Syncytial Virus by PCR NEGATIVE NEGATIVE Final    Comment: (NOTE) Fact Sheet for Patients: bloggercourse.com  Fact Sheet for Healthcare  Providers: seriousbroker.it  This test is not yet approved or cleared by the [place]  FDA and has been authorized for detection and/or diagnosis of SARS-CoV-2 by FDA under an Emergency Use Authorization (EUA). This EUA will remain in effect (meaning this test can be used) for the duration of the COVID-19 declaration under Section 564(b)(1) of the Act, 21 U.S.C. section 360bbb-3(b)(1), unless the authorization is terminated or revoked.  Performed at [facility], [address], [place], [place] [address]      Scheduled Meds:  albuterol   2.5 mg Nebulization Q8H   amiodarone   200 mg Oral Daily   arformoterol   15 mcg Nebulization BID   atorvastatin   40 mg Oral Daily   benzonatate   200 mg Oral TID   budesonide  (PULMICORT ) nebulizer solution  0.5 mg Nebulization BID   dapagliflozin  propanediol  10 mg Oral QAC breakfast   diltiazem   180 mg Oral Daily   DULoxetine   60 mg Oral Daily   levothyroxine   137 mcg Oral Q0600   loratadine   10 mg Oral QPM   losartan   12.5 mg Oral Daily   methylPREDNISolone  (SOLU-MEDROL ) injection  40 mg Intravenous Q12H   metoprolol  succinate  25 mg Oral Daily   oseltamivir   30 mg Oral BID   potassium chloride   40 mEq Oral Once   pregabalin   75 mg Oral QHS   revefenacin   175 mcg Nebulization Daily   Rivaroxaban   15 mg Oral Daily   torsemide   40 mg Oral Daily   Continuous Infusions:  Procedures/Studies: DG CHEST PORT 1 VIEW Result Date: 03/29/2024 EXAM: 1 VIEW XRAY OF THE CHEST 03/29/2024 04:41:00 AM COMPARISON: AP and lateral chest 03/26/2024. CLINICAL HISTORY: FINDINGS: LUNGS AND PLEURA: Mild chronic changes of the lungs, but no focal consolidation is seen. Scattered linear Scarring is present. The lungs are hypoexpanded. HEART AND MEDIASTINUM: Mild cardiomegaly. Central vascular prominence is again noted, but no overt edema. There is a tortuous aorta with atherosclerosis and stable mediastinum. BONES AND SOFT TISSUES: Osteopenia  with thoracic spondylosis. The patient is rotated to the right. IMPRESSION: 1. No focal consolidation or overt pulmonary edema. 2. Mild chronic lung changes with scarring and hypoexpanded lungs. 3. Mild cardiomegaly and central vascular prominence. Similar findings previously. Electronically signed by: Francis Quam MD 03/29/2024 05:39 AM EST RP Workstation: [id number]   DG Chest 2 View Result Date: 03/26/2024 EXAM: 2 VIEW(S) XRAY OF THE CHEST 03/26/2024 05:16:49 PM COMPARISON: 10/26/2023 CLINICAL HISTORY: sob, wheezing FINDINGS: LUNGS AND PLEURA: No focal pulmonary opacity. No pleural effusion. No pneumothorax. HEART AND MEDIASTINUM: No acute abnormality of the cardiac and mediastinal silhouettes. BONES AND SOFT TISSUES: No acute osseous abnormality. IMPRESSION: 1. No acute process. Electronically signed by: Franky Crease MD 03/26/2024 05:42 PM EST RP Workstation: [id number]    Alm Schneider, DO  [facility]  If 7PM-7AM, please contact night-coverage  [email or web address] Password [facility] 03/30/2024, 5:53 PM   LOS: 4 days   "

## 2024-03-30 NOTE — Plan of Care (Signed)
   Problem: Activity: Goal: Risk for activity intolerance will decrease Outcome: Progressing   Problem: Coping: Goal: Level of anxiety will decrease Outcome: Progressing

## 2024-03-31 DIAGNOSIS — I5032 Chronic diastolic (congestive) heart failure: Secondary | ICD-10-CM | POA: Diagnosis not present

## 2024-03-31 DIAGNOSIS — J101 Influenza due to other identified influenza virus with other respiratory manifestations: Secondary | ICD-10-CM | POA: Diagnosis not present

## 2024-03-31 DIAGNOSIS — J441 Chronic obstructive pulmonary disease with (acute) exacerbation: Secondary | ICD-10-CM | POA: Diagnosis not present

## 2024-03-31 LAB — BASIC METABOLIC PANEL WITH GFR
Anion gap: 10 (ref 5–15)
BUN: 36 mg/dL — ABNORMAL HIGH (ref 8–23)
CO2: 33 mmol/L — ABNORMAL HIGH (ref 22–32)
Calcium: 9.6 mg/dL (ref 8.9–10.3)
Chloride: 98 mmol/L (ref 98–111)
Creatinine, Ser: 1.28 mg/dL — ABNORMAL HIGH (ref 0.44–1.00)
GFR, Estimated: 43 mL/min — ABNORMAL LOW
Glucose, Bld: 126 mg/dL — ABNORMAL HIGH (ref 70–99)
Potassium: 3.4 mmol/L — ABNORMAL LOW (ref 3.5–5.1)
Sodium: 142 mmol/L (ref 135–145)

## 2024-03-31 LAB — MAGNESIUM: Magnesium: 2.2 mg/dL (ref 1.7–2.4)

## 2024-03-31 MED ORDER — POTASSIUM CHLORIDE CRYS ER 20 MEQ PO TBCR
40.0000 meq | EXTENDED_RELEASE_TABLET | Freq: Once | ORAL | Status: AC
  Administered 2024-03-31: 40 meq via ORAL
  Filled 2024-03-31: qty 2

## 2024-03-31 NOTE — Progress Notes (Signed)
 Mobility Specialist Progress Note:    03/31/24 1020  Mobility  Activity Ambulated with assistance  Level of Assistance Independent  Assistive Device None  Distance Ambulated (ft) 160 ft  Range of Motion/Exercises Active;All extremities  Activity Response Tolerated well  Mobility Referral Yes  Mobility visit 1 Mobility  Mobility Specialist Start Time (ACUTE ONLY) 1020  Mobility Specialist Stop Time (ACUTE ONLY) 1040  Mobility Specialist Time Calculation (min) (ACUTE ONLY) 20 min   Pt received in bed, agreeable to mobility. Independently able to stand and ambulate with RW. Tolerated well, c/o SOB on RA during during ambulation. SpO2 89% on RA at rest, SpO2 87% on RA during ambulation. SpO2 93% on 2L during ambulation, returned supine. All needs met.  Chancey Cullinane Mobility Specialist Please contact via Special Educational Needs Teacher or  Rehab office at [phone]

## 2024-03-31 NOTE — Progress Notes (Signed)
 SATURATION QUALIFICATIONS: (This note is used to comply with regulatory documentation for home oxygen )   Patient Saturations on Room Air at Rest = 95   Patient Saturations on Room Air while Ambulating = 87   Patient Saturations on 2 Liters of oxygen  while Ambulating = 96   Please briefly explain why patient needs home oxygen : To maintain 02 sat at 90% or above during ambulation.  Alm Schneider, DO

## 2024-03-31 NOTE — Care Management Important Message (Signed)
 Important Message  Patient Details  Name: Diane Cohen MRN: [id number] Date of Birth: 1946/01/13   Important Message Given:  Yes - Medicare IM     [name] [name] 03/31/2024, 4:24 PM

## 2024-03-31 NOTE — Plan of Care (Signed)

## 2024-03-31 NOTE — Progress Notes (Signed)
 "          PROGRESS NOTE  Diane Cohen FMW:[id number] DOB: [birth year] DOA: 03/26/2024 PCP: Shona Norleen PEDLAR, MD  Brief History:  79 y.o. female with medical history significant for diastolic CHF, atrial fibrillation on anticoagulation, COPD, hypertension and HLD.  Patient presented to the ED with complaints of difficulty breathing that started 3 days ago.  She reports productive cough, fevers, body aches, and difficulty breathing.  No chest pain.  She is supposed to be on 2L  O2 at night-but has not been using it because it broke. No vomiting no diarrhea no abdominal pain.  She has been compliant with Xarelto .  Patient was admitted for acute COPD exacerbation influenza A infection. She was started on IV Solu-Medrol  and DuoNebs.  She was continued on her home dose of torsemide .  She was noted to have signs of fluid overload and was treated with spot dosing of IV furosemide  during hospitalization.  Her oxygen  was gradually weaned.     Assessment/Plan: Acute on chronic respiratory failure with hypoxia - Secondary to COPD exacerbation and influenza - Patient is on 2 L at nighttime - Presented with oxygen  saturation 88% on room air and tachypnea - Stable on 2 L - Continue oseltamavir---finished 5 days   COPD exacerbation - Restart IV Solu-Medrol  - Start Yupelri  - Start Brovana  - Start Pulmicort  - Continue DuoNebs   Acute on chronic HFpEF - Has some signs of fluid overload on exam - Give spot dose IV furosemide  1/21 - 6/25 echo EF 60 to 65%, no WMA, normal RVF, trivial MR - 03/29/2024 weight 158.1 pounds - 03/30/24 weight 155.6 lb - Daily weights--NEg 3 lbs   Paroxysmal atrial fibrillation - Continue Xarelto  - Continue metoprolol  succinate, amiodarone , Cardizem  CD   Essential hypertension - Continuing losartan , metoprolol  succinate, Cardizem  CD   Hypothyroidism - Continue Synthroid    CKD stage III A - Baseline creatinine 0.9-1.2 - Monitor BMP with diuresis    Hypokalemia -replete -check mag 2.2                 Family Communication:   no Family at bedside   Consultants:  none   Code Status:  FULL    DVT Prophylaxis: xarelto      Procedures: As Listed in Progress Note Above   Antibiotics: None     Subjective:  Pt is breathing better, but has some sob with ambulation.  Denies f/c, cp, n/v/d, abd pain Objective: Vitals:   03/31/24 0901 03/31/24 0903 03/31/24 1350 03/31/24 1450  BP:   (!) 114/50   Pulse:   73   Resp:   20   Temp:   98.1 F (36.7 C)   TempSrc:   Oral   SpO2: 100% 100% 95% 95%  Weight:      Height:        Intake/Output Summary (Last 24 hours) at 03/31/2024 1736 Last data filed at 03/31/2024 1350 Gross per 24 hour  Intake 720 ml  Output --  Net 720 ml   Weight change: -1.314 kg Exam:  General:  Pt is alert, follows commands appropriately, not in acute distress HEENT: No icterus, No thrush, No neck mass, [place]/AT Cardiovascular: RRR, S1/S2, no rubs, no gallops Respiratory: bibasilar crackles.  Mild bibasilar wheeze Abdomen: Soft/+BS, non tender, non distended, no guarding Extremities: No edema, No lymphangitis, No petechiae, No rashes, no synovitis   Data Reviewed: I have personally reviewed following labs and imaging studies Basic Metabolic Panel: Recent Labs  Lab 03/26/24  1640 03/27/24 0427 03/30/24 0407 03/31/24 0424  NA 137 142 142 142  K 3.2* 3.9 3.1* 3.4*  CL 100 104 97* 98  CO2 25 26 30  33*  GLUCOSE 100* 114* 118* 126*  BUN 12 12 35* 36*  CREATININE 0.91 0.92 1.29* 1.28*  CALCIUM  9.1 9.7 9.4 9.6  MG  --   --  2.2 2.2   Liver Function Tests: No results for input(s): AST, ALT, ALKPHOS, BILITOT, PROT, ALBUMIN in the last 168 hours. No results for input(s): LIPASE, AMYLASE in the last 168 hours. No results for input(s): AMMONIA in the last 168 hours. Coagulation Profile: No results for input(s): INR, PROTIME in the last 168 hours. CBC: Recent Labs  Lab  03/26/24 1640 03/30/24 0407  WBC 3.7* 4.7  NEUTROABS 2.7  --   HGB 11.9* 12.6  HCT 36.7 39.2  MCV 100.5* 100.3*  PLT 144* 160   Cardiac Enzymes: No results for input(s): CKTOTAL, CKMB, CKMBINDEX, TROPONINI in the last 168 hours. BNP: Invalid input(s): POCBNP CBG: No results for input(s): GLUCAP in the last 168 hours. HbA1C: No results for input(s): HGBA1C in the last 72 hours. Urine analysis:    Component Value Date/Time   COLORURINE YELLOW 12/28/2013 1706   APPEARANCEUR CLEAR 12/28/2013 1706   LABSPEC <1.005 (L) 12/28/2013 1706   PHURINE 5.5 12/28/2013 1706   GLUCOSEU NEGATIVE 12/28/2013 1706   HGBUR NEGATIVE 12/28/2013 1706   BILIRUBINUR NEGATIVE 12/28/2013 1706   KETONESUR NEGATIVE 12/28/2013 1706   PROTEINUR NEGATIVE 12/28/2013 1706   UROBILINOGEN 0.2 12/28/2013 1706   NITRITE NEGATIVE 12/28/2013 1706   LEUKOCYTESUR NEGATIVE 12/28/2013 1706   Sepsis Labs: @LABRCNTIP (procalcitonin:4,lacticidven:4) ) Recent Results (from the past 240 hours)  Resp panel by RT-PCR (RSV, Flu A&B, Covid) Anterior Nasal Swab     Status: Abnormal   Collection Time: 03/26/24  5:01 PM   Specimen: Anterior Nasal Swab  Result Value Ref Range Status   SARS Coronavirus 2 by RT PCR NEGATIVE NEGATIVE Final    Comment: (NOTE) SARS-CoV-2 target nucleic acids are NOT DETECTED.  The SARS-CoV-2 RNA is generally detectable in upper respiratory specimens during the acute phase of infection. The lowest concentration of SARS-CoV-2 viral copies this assay can detect is 138 copies/mL. A negative result does not preclude SARS-Cov-2 infection and should not be used as the sole basis for treatment or other patient management decisions. A negative result may occur with  improper specimen collection/handling, submission of specimen other than nasopharyngeal swab, presence of viral mutation(s) within the areas targeted by this assay, and inadequate number of viral copies(<138 copies/mL). A  negative result must be combined with clinical observations, patient history, and epidemiological information. The expected result is Negative.  Fact Sheet for Patients:  bloggercourse.com  Fact Sheet for Healthcare Providers:  seriousbroker.it  This test is no t yet approved or cleared by the [place]  FDA and  has been authorized for detection and/or diagnosis of SARS-CoV-2 by FDA under an Emergency Use Authorization (EUA). This EUA will remain  in effect (meaning this test can be used) for the duration of the COVID-19 declaration under Section 564(b)(1) of the Act, 21 U.S.C.section 360bbb-3(b)(1), unless the authorization is terminated  or revoked sooner.       Influenza A by PCR POSITIVE (A) NEGATIVE Final   Influenza B by PCR NEGATIVE NEGATIVE Final    Comment: (NOTE) The Xpert Xpress SARS-CoV-2/FLU/RSV plus assay is intended as an aid in the diagnosis of influenza from Nasopharyngeal swab specimens and should  not be used as a sole basis for treatment. Nasal washings and aspirates are unacceptable for Xpert Xpress SARS-CoV-2/FLU/RSV testing.  Fact Sheet for Patients: bloggercourse.com  Fact Sheet for Healthcare Providers: seriousbroker.it  This test is not yet approved or cleared by the [place]  FDA and has been authorized for detection and/or diagnosis of SARS-CoV-2 by FDA under an Emergency Use Authorization (EUA). This EUA will remain in effect (meaning this test can be used) for the duration of the COVID-19 declaration under Section 564(b)(1) of the Act, 21 U.S.C. section 360bbb-3(b)(1), unless the authorization is terminated or revoked.     Resp Syncytial Virus by PCR NEGATIVE NEGATIVE Final    Comment: (NOTE) Fact Sheet for Patients: bloggercourse.com  Fact Sheet for Healthcare  Providers: seriousbroker.it  This test is not yet approved or cleared by the [place]  FDA and has been authorized for detection and/or diagnosis of SARS-CoV-2 by FDA under an Emergency Use Authorization (EUA). This EUA will remain in effect (meaning this test can be used) for the duration of the COVID-19 declaration under Section 564(b)(1) of the Act, 21 U.S.C. section 360bbb-3(b)(1), unless the authorization is terminated or revoked.  Performed at [facility], [address] [place], [place] [address]      Scheduled Meds:  albuterol   2.5 mg Nebulization Q6H   amiodarone   200 mg Oral Daily   arformoterol   15 mcg Nebulization BID   atorvastatin   40 mg Oral Daily   benzonatate   200 mg Oral TID   budesonide  (PULMICORT ) nebulizer solution  0.5 mg Nebulization BID   dapagliflozin  propanediol  10 mg Oral QAC breakfast   diltiazem   180 mg Oral Daily   DULoxetine   60 mg Oral Daily   levothyroxine   137 mcg Oral Q0600   loratadine   10 mg Oral QPM   losartan   12.5 mg Oral Daily   methylPREDNISolone  (SOLU-MEDROL ) injection  40 mg Intravenous Q12H   metoprolol  succinate  25 mg Oral Daily   pregabalin   75 mg Oral QHS   revefenacin   175 mcg Nebulization Daily   Rivaroxaban   15 mg Oral Daily   torsemide   40 mg Oral Daily   Continuous Infusions:  Procedures/Studies: DG CHEST PORT 1 VIEW Result Date: 03/29/2024 EXAM: 1 VIEW XRAY OF THE CHEST 03/29/2024 04:41:00 AM COMPARISON: AP and lateral chest 03/26/2024. CLINICAL HISTORY: FINDINGS: LUNGS AND PLEURA: Mild chronic changes of the lungs, but no focal consolidation is seen. Scattered linear Scarring is present. The lungs are hypoexpanded. HEART AND MEDIASTINUM: Mild cardiomegaly. Central vascular prominence is again noted, but no overt edema. There is a tortuous aorta with atherosclerosis and stable mediastinum. BONES AND SOFT TISSUES: Osteopenia with thoracic spondylosis. The patient is rotated to the right.  IMPRESSION: 1. No focal consolidation or overt pulmonary edema. 2. Mild chronic lung changes with scarring and hypoexpanded lungs. 3. Mild cardiomegaly and central vascular prominence. Similar findings previously. Electronically signed by: Francis Quam MD 03/29/2024 05:39 AM EST RP Workstation: [id number]   DG Chest 2 View Result Date: 03/26/2024 EXAM: 2 VIEW(S) XRAY OF THE CHEST 03/26/2024 05:16:49 PM COMPARISON: 10/26/2023 CLINICAL HISTORY: sob, wheezing FINDINGS: LUNGS AND PLEURA: No focal pulmonary opacity. No pleural effusion. No pneumothorax. HEART AND MEDIASTINUM: No acute abnormality of the cardiac and mediastinal silhouettes. BONES AND SOFT TISSUES: No acute osseous abnormality. IMPRESSION: 1. No acute process. Electronically signed by: Franky Crease MD 03/26/2024 05:42 PM EST RP Workstation: [id number]    Alm Schneider, DO  [facility]  If 7PM-7AM, please contact night-coverage  [email or web address] Password [facility] 03/31/2024, 5:36 PM   LOS: 5 days   "
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
## 2024-04-01 DIAGNOSIS — I5033 Acute on chronic diastolic (congestive) heart failure: Secondary | ICD-10-CM | POA: Diagnosis not present

## 2024-04-01 DIAGNOSIS — J101 Influenza due to other identified influenza virus with other respiratory manifestations: Secondary | ICD-10-CM | POA: Diagnosis not present

## 2024-04-01 DIAGNOSIS — J441 Chronic obstructive pulmonary disease with (acute) exacerbation: Secondary | ICD-10-CM | POA: Diagnosis not present

## 2024-04-01 LAB — MAGNESIUM: Magnesium: 2.3 mg/dL (ref 1.7–2.4)

## 2024-04-01 LAB — BASIC METABOLIC PANEL WITH GFR
Anion gap: 13 (ref 5–15)
BUN: 38 mg/dL — ABNORMAL HIGH (ref 8–23)
CO2: 30 mmol/L (ref 22–32)
Calcium: 9.6 mg/dL (ref 8.9–10.3)
Chloride: 97 mmol/L — ABNORMAL LOW (ref 98–111)
Creatinine, Ser: 1.12 mg/dL — ABNORMAL HIGH (ref 0.44–1.00)
GFR, Estimated: 50 mL/min — ABNORMAL LOW
Glucose, Bld: 134 mg/dL — ABNORMAL HIGH (ref 70–99)
Potassium: 3.6 mmol/L (ref 3.5–5.1)
Sodium: 140 mmol/L (ref 135–145)

## 2024-04-01 MED ORDER — PREDNISONE 10 MG PO TABS: 60.0000 mg | ORAL_TABLET | Freq: Every day | ORAL | 0 refills | Status: AC

## 2024-04-01 MED ORDER — POTASSIUM CHLORIDE CRYS ER 20 MEQ PO TBCR
20.0000 meq | EXTENDED_RELEASE_TABLET | Freq: Once | ORAL | Status: AC
  Administered 2024-04-01: 20 meq via ORAL
  Filled 2024-04-01: qty 1

## 2024-04-01 MED ORDER — ALBUTEROL SULFATE (2.5 MG/3ML) 0.083% IN NEBU
2.5000 mg | INHALATION_SOLUTION | Freq: Three times a day (TID) | RESPIRATORY_TRACT | Status: DC
  Administered 2024-04-01: 2.5 mg via RESPIRATORY_TRACT
  Filled 2024-04-01: qty 3

## 2024-04-01 MED ORDER — PREDNISONE 20 MG PO TABS: 60.0000 mg | ORAL_TABLET | Freq: Every day | ORAL | Status: DC

## 2024-04-01 NOTE — Discharge Summary (Signed)
 " Physician Discharge Summary   Patient: Diane Cohen MRN: [id number] DOB: January 09, 1946  Admit date:     03/26/2024  Discharge date: 04/01/24  Discharge Physician: Alm Markese Bloxham   PCP: Shona Norleen PEDLAR, MD   Recommendations at discharge:   Please follow up with primary care provider within 1-2 weeks  Please repeat BMP and CBC in one week     Hospital Course: 79 y.o. female with medical history significant for diastolic CHF, atrial fibrillation on anticoagulation, COPD, hypertension and HLD.  Patient presented to the ED with complaints of difficulty breathing that started 3 days ago.  She reports productive cough, fevers, body aches, and difficulty breathing.  No chest pain.  She is supposed to be on 2L  O2 at night-but has not been using it because it broke. No vomiting no diarrhea no abdominal pain.  She has been compliant with Xarelto .  Patient was admitted for acute COPD exacerbation influenza A infection. She was started on IV Solu-Medrol  and DuoNebs.  She was continued on her home dose of torsemide .  She was given a dose of IV furosemide  . her oxygen  was gradually weaned.  She gradually improved clinically.  The patient did desaturate to 87% with ambulation.  She was discharged on 2 L nasal cannula.    Assessment and Plan: Acute on chronic respiratory failure with hypoxia - Secondary to COPD exacerbation and influenza - Patient is on 2 L at nighttime - Presented with oxygen  saturation 88% on room air and tachypnea - Stable on 2 L - Continue oseltamavir---finished 5 days -Discharge home on 2 L nasal cannula as the patient desaturated with ambulation on room air    COPD exacerbation - Restart IV Solu-Medrol >> DC home with prednisone  taper - Start Yupelri  - Start Brovana  - Start Pulmicort  - Continue DuoNebs   Acute on chronic HFpEF - Has some signs of fluid overload on exam - Give spot dose IV furosemide  1/21 - 6/25 echo EF 60 to 65%, no WMA, normal RVF, trivial MR - 03/29/2024 weight  158.1 pounds - 03/30/24 weight 155.6 lb - Daily weights--NEg 3 lbs   Paroxysmal atrial fibrillation - Continue Xarelto  - Continue metoprolol  succinate, amiodarone , Cardizem  CD -Rate controlled   Essential hypertension - Continuing losartan , metoprolol  succinate, Cardizem  CD   Hypothyroidism - Continue Synthroid    CKD stage III A - Baseline creatinine 0.9-1.2 - Monitor BMP with diuresis   Hypokalemia -replete -check mag 2.2        Consultants: None Procedures performed: None Disposition: Home Diet recommendation:  Cardiac diet DISCHARGE MEDICATION: Allergies as of 04/01/2024       Reactions   Levofloxacin Other (See Comments)   Headache levofloxacin   Misc. Sulfonamide Containing Compounds Other (See Comments)   Sulfa Antibiotics Hives   Sulfamethoxazole Other (See Comments)   sulfamethoxazole        Medication List     TAKE these medications    amiodarone  200 MG tablet Commonly known as: PACERONE  TAKE 1 TABLET(200 MG) BY MOUTH DAILY   atorvastatin  40 MG tablet Commonly known as: LIPITOR Take 1 tablet (40 mg total) by mouth daily.   benzonatate  200 MG capsule Commonly known as: TESSALON  Take 200 mg by mouth 2 (two) times daily as needed.   Centrum Silver 50+Women Tabs Take 1 tablet by mouth daily.   DELSYM  PO Take 1 Dose by mouth every 12 (twelve) hours as needed.   dextromethorphan -guaiFENesin  30-600 MG 12hr tablet Commonly known as: MUCINEX  DM Take 1 tablet by  mouth 2 (two) times daily.   diltiazem  180 MG 24 hr capsule Commonly known as: CARDIZEM  CD TAKE 1 CAPSULE(180 MG) BY MOUTH DAILY   docusate sodium  100 MG capsule Commonly known as: COLACE Take 300 mg by mouth daily as needed for mild constipation.   DULoxetine  60 MG capsule Commonly known as: CYMBALTA  Take 1 capsule (60 mg total) by mouth daily.   Farxiga  10 MG Tabs tablet Generic drug: dapagliflozin  propanediol TAKE 1 TABLET(10 MG) BY MOUTH DAILY BEFORE BREAKFAST    fluticasone  50 MCG/ACT nasal spray Commonly known as: FLONASE  Place 2 sprays into both nostrils daily as needed for allergies or rhinitis.   Fusion Plus Caps Take 1 capsule by mouth daily.   ipratropium-albuterol  0.5-2.5 (3) MG/3ML Soln Commonly known as: DUONEB Take 3 mLs by nebulization every 6 (six) hours as needed.   levocetirizine 5 MG tablet Commonly known as: XYZAL  Take 5 mg by mouth daily.   levothyroxine  137 MCG tablet Commonly known as: SYNTHROID  Take 137 mcg by mouth daily.   losartan  25 MG tablet Commonly known as: COZAAR  Take 25 mg by mouth daily.   magnesium  oxide 400 (240 Mg) MG tablet Commonly known as: MAG-OX Take 800 mg by mouth daily.   metoprolol  succinate 25 MG 24 hr tablet Commonly known as: TOPROL -XL Take 1 tablet (25 mg total) by mouth daily.   potassium chloride  SA 20 MEQ tablet Commonly known as: KLOR-CON  M TAKE 1 TABLET(20 MEQ) BY MOUTH DAILY   predniSONE  10 MG tablet Commonly known as: DELTASONE  Take 6 tablets (60 mg total) by mouth daily with breakfast. And decrease by one tablet daily Start taking on: April 02, 2024   pregabalin  75 MG capsule Commonly known as: LYRICA  TAKE 1 CAPSULE(75 MG) BY MOUTH DAILY What changed: See the new instructions.   Rivaroxaban  15 MG Tabs tablet Commonly known as: XARELTO  Take 1 tablet (15 mg total) by mouth daily.   Symbicort 160-4.5 MCG/ACT inhaler Generic drug: budesonide -formoterol Inhale 2 puffs into the lungs 2 (two) times daily.   torsemide  20 MG tablet Commonly known as: DEMADEX  Take 2 tablets (40 mg total) by mouth daily.   Vitamin D3 50 MCG (2000 UT) capsule Take 2,000 Units by mouth daily.        Discharge Exam: Filed Weights   03/30/24 0442 03/31/24 0500 04/01/24 0500  Weight: 70.6 kg 70.4 kg 69.2 kg   HEENT:  [place]/AT, No thrush, no icterus CV:  RRR, no rub, no S3, no S4 Lung: Bibasal rales.  Minimal bibasilar wheeze.  Good air movement. Abd:  soft/+BS, NT Ext:  No edema,  no lymphangitis, no synovitis, no rash   Condition at discharge: stable  The results of significant diagnostics from this hospitalization (including imaging, microbiology, ancillary and laboratory) are listed below for reference.   Imaging Studies: DG CHEST PORT 1 VIEW Result Date: 03/29/2024 EXAM: 1 VIEW XRAY OF THE CHEST 03/29/2024 04:41:00 AM COMPARISON: AP and lateral chest 03/26/2024. CLINICAL HISTORY: FINDINGS: LUNGS AND PLEURA: Mild chronic changes of the lungs, but no focal consolidation is seen. Scattered linear Scarring is present. The lungs are hypoexpanded. HEART AND MEDIASTINUM: Mild cardiomegaly. Central vascular prominence is again noted, but no overt edema. There is a tortuous aorta with atherosclerosis and stable mediastinum. BONES AND SOFT TISSUES: Osteopenia with thoracic spondylosis. The patient is rotated to the right. IMPRESSION: 1. No focal consolidation or overt pulmonary edema. 2. Mild chronic lung changes with scarring and hypoexpanded lungs. 3. Mild cardiomegaly and central vascular prominence.  Similar findings previously. Electronically signed by: Francis Quam MD 03/29/2024 05:39 AM EST RP Workstation: [id number]   DG Chest 2 View Result Date: 03/26/2024 EXAM: 2 VIEW(S) XRAY OF THE CHEST 03/26/2024 05:16:49 PM COMPARISON: 10/26/2023 CLINICAL HISTORY: sob, wheezing FINDINGS: LUNGS AND PLEURA: No focal pulmonary opacity. No pleural effusion. No pneumothorax. HEART AND MEDIASTINUM: No acute abnormality of the cardiac and mediastinal silhouettes. BONES AND SOFT TISSUES: No acute osseous abnormality. IMPRESSION: 1. No acute process. Electronically signed by: Franky Crease MD 03/26/2024 05:42 PM EST RP Workstation: [id number]    Microbiology: Results for orders placed or performed during the hospital encounter of 03/26/24  Resp panel by RT-PCR (RSV, Flu A&B, Covid) Anterior Nasal Swab     Status: Abnormal   Collection Time: 03/26/24  5:01 PM   Specimen: Anterior Nasal Swab   Result Value Ref Range Status   SARS Coronavirus 2 by RT PCR NEGATIVE NEGATIVE Final    Comment: (NOTE) SARS-CoV-2 target nucleic acids are NOT DETECTED.  The SARS-CoV-2 RNA is generally detectable in upper respiratory specimens during the acute phase of infection. The lowest concentration of SARS-CoV-2 viral copies this assay can detect is 138 copies/mL. A negative result does not preclude SARS-Cov-2 infection and should not be used as the sole basis for treatment or other patient management decisions. A negative result may occur with  improper specimen collection/handling, submission of specimen other than nasopharyngeal swab, presence of viral mutation(s) within the areas targeted by this assay, and inadequate number of viral copies(<138 copies/mL). A negative result must be combined with clinical observations, patient history, and epidemiological information. The expected result is Negative.  Fact Sheet for Patients:  bloggercourse.com  Fact Sheet for Healthcare Providers:  seriousbroker.it  This test is no t yet approved or cleared by the [place]  FDA and  has been authorized for detection and/or diagnosis of SARS-CoV-2 by FDA under an Emergency Use Authorization (EUA). This EUA will remain  in effect (meaning this test can be used) for the duration of the COVID-19 declaration under Section 564(b)(1) of the Act, 21 U.S.C.section 360bbb-3(b)(1), unless the authorization is terminated  or revoked sooner.       Influenza A by PCR POSITIVE (A) NEGATIVE Final   Influenza B by PCR NEGATIVE NEGATIVE Final    Comment: (NOTE) The Xpert Xpress SARS-CoV-2/FLU/RSV plus assay is intended as an aid in the diagnosis of influenza from Nasopharyngeal swab specimens and should not be used as a sole basis for treatment. Nasal washings and aspirates are unacceptable for Xpert Xpress SARS-CoV-2/FLU/RSV testing.  Fact Sheet for  Patients: bloggercourse.com  Fact Sheet for Healthcare Providers: seriousbroker.it  This test is not yet approved or cleared by the [place]  FDA and has been authorized for detection and/or diagnosis of SARS-CoV-2 by FDA under an Emergency Use Authorization (EUA). This EUA will remain in effect (meaning this test can be used) for the duration of the COVID-19 declaration under Section 564(b)(1) of the Act, 21 U.S.C. section 360bbb-3(b)(1), unless the authorization is terminated or revoked.     Resp Syncytial Virus by PCR NEGATIVE NEGATIVE Final    Comment: (NOTE) Fact Sheet for Patients: bloggercourse.com  Fact Sheet for Healthcare Providers: seriousbroker.it  This test is not yet approved or cleared by the [place]  FDA and has been authorized for detection and/or diagnosis of SARS-CoV-2 by FDA under an Emergency Use Authorization (EUA). This EUA will remain in effect (meaning this test can be used) for the duration of the COVID-19 declaration under Section  564(b)(1) of the Act, 21 U.S.C. section 360bbb-3(b)(1), unless the authorization is terminated or revoked.  Performed at [facility], [address], [place], [place] [address]     Labs: CBC: Recent Labs  Lab 03/26/24 1640 03/30/24 0407  WBC 3.7* 4.7  NEUTROABS 2.7  --   HGB 11.9* 12.6  HCT 36.7 39.2  MCV 100.5* 100.3*  PLT 144* 160   Basic Metabolic Panel: Recent Labs  Lab 03/26/24 1640 03/27/24 0427 03/30/24 0407 03/31/24 0424 04/01/24 0412  NA 137 142 142 142 140  K 3.2* 3.9 3.1* 3.4* 3.6  CL 100 104 97* 98 97*  CO2 25 26 30  33* 30  GLUCOSE 100* 114* 118* 126* 134*  BUN 12 12 35* 36* 38*  CREATININE 0.91 0.92 1.29* 1.28* 1.12*  CALCIUM  9.1 9.7 9.4 9.6 9.6  MG  --   --  2.2 2.2 2.3   Liver Function Tests: No results for input(s): AST, ALT, ALKPHOS, BILITOT, PROT, ALBUMIN in the  last 168 hours. CBG: No results for input(s): GLUCAP in the last 168 hours.  Discharge time spent: greater than 30 minutes.  Signed: Alm Schneider, MD [facility] 04/01/2024 "

## 2024-04-01 NOTE — Plan of Care (Signed)
  Problem: Education: Goal: Knowledge of General Education information will improve Description: Including pain rating scale, medication(s)/side effects and non-pharmacologic comfort measures Outcome: Progressing   Problem: Clinical Measurements: Goal: Respiratory complications will improve Outcome: Progressing   Problem: Respiratory: Goal: Ability to maintain a clear airway will improve Outcome: Progressing Goal: Levels of oxygenation will improve Outcome: Progressing

## 2024-04-03 ENCOUNTER — Telehealth: Payer: Self-pay | Admitting: *Deleted
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
## 2024-04-10 ENCOUNTER — Telehealth (HOSPITAL_BASED_OUTPATIENT_CLINIC_OR_DEPARTMENT_OTHER): Payer: Self-pay
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
## 2024-04-10 NOTE — Telephone Encounter (Signed)
 Im sorry which medications was in question? Whether or not she had a rescue inhaler?  It doesn't state which medication is the issues- if you can just clarify

## 2024-04-20 ENCOUNTER — Ambulatory Visit: Admitting: Pulmonary Disease

## 2024-05-30 ENCOUNTER — Ambulatory Visit: Admitting: Cardiology
